# Patient Record
Sex: Male | Born: 1935 | Race: White | Hispanic: No | Marital: Married | Smoking: Former smoker
Health system: Southern US, Community
[De-identification: ages and names within clinical notes are randomized; demographics above are authoritative.]

## PROBLEM LIST (undated history)

## (undated) DIAGNOSIS — I10 Essential (primary) hypertension: Secondary | ICD-10-CM

## (undated) DIAGNOSIS — J449 Chronic obstructive pulmonary disease, unspecified: Secondary | ICD-10-CM

## (undated) DIAGNOSIS — N179 Acute kidney failure, unspecified: Secondary | ICD-10-CM

## (undated) DIAGNOSIS — E669 Obesity, unspecified: Secondary | ICD-10-CM

## (undated) DIAGNOSIS — E785 Hyperlipidemia, unspecified: Secondary | ICD-10-CM

## (undated) DIAGNOSIS — I679 Cerebrovascular disease, unspecified: Secondary | ICD-10-CM

## (undated) DIAGNOSIS — M1990 Unspecified osteoarthritis, unspecified site: Secondary | ICD-10-CM

## (undated) DIAGNOSIS — C9110 Chronic lymphocytic leukemia of B-cell type not having achieved remission: Principal | ICD-10-CM

## (undated) DIAGNOSIS — I4891 Unspecified atrial fibrillation: Secondary | ICD-10-CM

## (undated) DIAGNOSIS — Z862 Personal history of diseases of the blood and blood-forming organs and certain disorders involving the immune mechanism: Secondary | ICD-10-CM

## (undated) DIAGNOSIS — I509 Heart failure, unspecified: Secondary | ICD-10-CM

## (undated) DIAGNOSIS — E119 Type 2 diabetes mellitus without complications: Secondary | ICD-10-CM

## (undated) HISTORY — DX: Unspecified atrial fibrillation: I48.91

## (undated) HISTORY — DX: Personal history of diseases of the blood and blood-forming organs and certain disorders involving the immune mechanism: Z86.2

## (undated) HISTORY — DX: Cerebrovascular disease, unspecified: I67.9

## (undated) HISTORY — DX: Type 2 diabetes mellitus without complications: E11.9

## (undated) HISTORY — DX: Essential (primary) hypertension: I10

## (undated) HISTORY — DX: Chronic lymphocytic leukemia of B-cell type not having achieved remission: C91.10

## (undated) HISTORY — DX: Hyperlipidemia, unspecified: E78.5

## (undated) HISTORY — PX: VASECTOMY: SHX75

## (undated) HISTORY — PX: KNEE ARTHROSCOPY W/ ALLOGRAFT IMPANT: SHX1862

## (undated) HISTORY — DX: Unspecified osteoarthritis, unspecified site: M19.90

## (undated) HISTORY — DX: Obesity, unspecified: E66.9

---

## 2006-04-12 ENCOUNTER — Encounter: Admission: RE | Admit: 2006-04-12 | Discharge: 2006-04-12 | Payer: Self-pay | Admitting: Family Medicine

## 2007-03-07 ENCOUNTER — Encounter: Admission: RE | Admit: 2007-03-07 | Discharge: 2007-03-07 | Payer: Self-pay | Admitting: Family Medicine

## 2008-10-27 ENCOUNTER — Inpatient Hospital Stay (HOSPITAL_COMMUNITY): Admission: RE | Admit: 2008-10-27 | Discharge: 2008-10-31 | Payer: Self-pay | Admitting: Orthopedic Surgery

## 2008-10-27 ENCOUNTER — Encounter (INDEPENDENT_AMBULATORY_CARE_PROVIDER_SITE_OTHER): Payer: Self-pay | Admitting: Orthopedic Surgery

## 2008-10-27 ENCOUNTER — Ambulatory Visit: Payer: Self-pay | Admitting: Cardiology

## 2008-12-03 ENCOUNTER — Encounter: Payer: Self-pay | Admitting: Cardiology

## 2008-12-03 ENCOUNTER — Ambulatory Visit: Payer: Self-pay | Admitting: Cardiology

## 2008-12-03 DIAGNOSIS — E785 Hyperlipidemia, unspecified: Secondary | ICD-10-CM | POA: Insufficient documentation

## 2008-12-03 DIAGNOSIS — I10 Essential (primary) hypertension: Secondary | ICD-10-CM

## 2008-12-03 DIAGNOSIS — I4891 Unspecified atrial fibrillation: Secondary | ICD-10-CM

## 2008-12-03 DIAGNOSIS — R609 Edema, unspecified: Secondary | ICD-10-CM

## 2008-12-17 ENCOUNTER — Ambulatory Visit: Payer: Self-pay | Admitting: Cardiology

## 2008-12-17 LAB — CONVERTED CEMR LAB
Albumin: 3.8 g/dL (ref 3.5–5.2)
Alkaline Phosphatase: 49 units/L (ref 39–117)
BUN: 18 mg/dL (ref 6–23)
Bilirubin, Direct: 0.2 mg/dL (ref 0.0–0.3)
CO2: 34 meq/L — ABNORMAL HIGH (ref 19–32)
Chloride: 100 meq/L (ref 96–112)
Cholesterol: 95 mg/dL (ref 0–200)
Creatinine, Ser: 1 mg/dL (ref 0.4–1.5)
LDL Cholesterol: 40 mg/dL (ref 0–99)
Potassium: 4 meq/L (ref 3.5–5.1)
Total CHOL/HDL Ratio: 3
Total Protein: 7.2 g/dL (ref 6.0–8.3)
Triglycerides: 108 mg/dL (ref 0.0–149.0)
VLDL: 21.6 mg/dL (ref 0.0–40.0)

## 2009-02-17 ENCOUNTER — Encounter: Payer: Self-pay | Admitting: *Deleted

## 2009-03-25 ENCOUNTER — Encounter: Payer: Self-pay | Admitting: *Deleted

## 2009-04-08 ENCOUNTER — Encounter (INDEPENDENT_AMBULATORY_CARE_PROVIDER_SITE_OTHER): Payer: Self-pay | Admitting: *Deleted

## 2010-02-08 ENCOUNTER — Telehealth: Payer: Self-pay | Admitting: Pulmonary Disease

## 2010-02-19 ENCOUNTER — Ambulatory Visit: Payer: Self-pay | Admitting: Pulmonary Disease

## 2010-02-19 DIAGNOSIS — R0602 Shortness of breath: Secondary | ICD-10-CM | POA: Insufficient documentation

## 2010-02-19 DIAGNOSIS — J9610 Chronic respiratory failure, unspecified whether with hypoxia or hypercapnia: Secondary | ICD-10-CM | POA: Insufficient documentation

## 2010-02-19 LAB — CONVERTED CEMR LAB
Chloride: 99 meq/L (ref 96–112)
GFR calc non Af Amer: 43.77 mL/min (ref >60)
Potassium: 4.8 meq/L (ref 3.5–5.1)
Pro B Natriuretic peptide (BNP): 187.7 pg/mL — ABNORMAL HIGH (ref 0.0–100.0)
Sodium: 143 meq/L (ref 135–145)

## 2010-02-25 ENCOUNTER — Telehealth: Payer: Self-pay | Admitting: Pulmonary Disease

## 2010-03-02 ENCOUNTER — Ambulatory Visit (HOSPITAL_COMMUNITY): Admission: RE | Admit: 2010-03-02 | Discharge: 2010-03-02 | Payer: Self-pay | Admitting: Pulmonary Disease

## 2010-03-02 ENCOUNTER — Ambulatory Visit: Payer: Self-pay | Admitting: Internal Medicine

## 2010-03-02 ENCOUNTER — Encounter: Payer: Self-pay | Admitting: Pulmonary Disease

## 2010-03-02 ENCOUNTER — Ambulatory Visit: Payer: Self-pay

## 2010-03-12 ENCOUNTER — Ambulatory Visit: Admission: RE | Admit: 2010-03-12 | Discharge: 2010-03-12 | Payer: Self-pay | Admitting: Pulmonary Disease

## 2010-03-12 ENCOUNTER — Ambulatory Visit: Payer: Self-pay | Admitting: Pulmonary Disease

## 2010-03-12 DIAGNOSIS — I5040 Unspecified combined systolic (congestive) and diastolic (congestive) heart failure: Secondary | ICD-10-CM | POA: Insufficient documentation

## 2010-03-12 DIAGNOSIS — J441 Chronic obstructive pulmonary disease with (acute) exacerbation: Secondary | ICD-10-CM | POA: Insufficient documentation

## 2010-03-12 DIAGNOSIS — E662 Morbid (severe) obesity with alveolar hypoventilation: Secondary | ICD-10-CM | POA: Insufficient documentation

## 2010-03-23 ENCOUNTER — Telehealth (INDEPENDENT_AMBULATORY_CARE_PROVIDER_SITE_OTHER): Payer: Self-pay | Admitting: *Deleted

## 2010-03-31 ENCOUNTER — Telehealth (INDEPENDENT_AMBULATORY_CARE_PROVIDER_SITE_OTHER): Payer: Self-pay | Admitting: *Deleted

## 2010-04-02 ENCOUNTER — Telehealth (INDEPENDENT_AMBULATORY_CARE_PROVIDER_SITE_OTHER): Payer: Self-pay | Admitting: *Deleted

## 2010-04-08 ENCOUNTER — Telehealth (INDEPENDENT_AMBULATORY_CARE_PROVIDER_SITE_OTHER): Payer: Self-pay | Admitting: *Deleted

## 2010-04-12 ENCOUNTER — Telehealth: Payer: Self-pay | Admitting: Pulmonary Disease

## 2010-04-15 ENCOUNTER — Ambulatory Visit: Payer: Self-pay | Admitting: Pulmonary Disease

## 2010-04-16 DIAGNOSIS — I2789 Other specified pulmonary heart diseases: Secondary | ICD-10-CM | POA: Insufficient documentation

## 2010-04-20 ENCOUNTER — Telehealth: Payer: Self-pay | Admitting: Pulmonary Disease

## 2010-04-23 ENCOUNTER — Encounter: Payer: Self-pay | Admitting: Pulmonary Disease

## 2010-04-28 ENCOUNTER — Encounter: Payer: Self-pay | Admitting: Pulmonary Disease

## 2010-05-07 ENCOUNTER — Telehealth (INDEPENDENT_AMBULATORY_CARE_PROVIDER_SITE_OTHER): Payer: Self-pay | Admitting: *Deleted

## 2010-05-10 ENCOUNTER — Telehealth: Payer: Self-pay | Admitting: Pulmonary Disease

## 2010-05-11 ENCOUNTER — Telehealth: Payer: Self-pay | Admitting: Pulmonary Disease

## 2010-06-09 ENCOUNTER — Ambulatory Visit: Payer: Self-pay | Admitting: Critical Care Medicine

## 2010-06-09 ENCOUNTER — Inpatient Hospital Stay (HOSPITAL_COMMUNITY): Admission: EM | Admit: 2010-06-09 | Discharge: 2010-07-22 | Payer: Self-pay | Admitting: Emergency Medicine

## 2010-06-09 ENCOUNTER — Ambulatory Visit: Payer: Self-pay | Admitting: Cardiology

## 2010-06-10 ENCOUNTER — Ambulatory Visit: Payer: Self-pay | Admitting: Infectious Disease

## 2010-06-11 ENCOUNTER — Encounter: Payer: Self-pay | Admitting: Internal Medicine

## 2010-06-11 ENCOUNTER — Ambulatory Visit: Payer: Self-pay | Admitting: Vascular Surgery

## 2010-06-30 ENCOUNTER — Encounter (INDEPENDENT_AMBULATORY_CARE_PROVIDER_SITE_OTHER): Payer: Self-pay | Admitting: *Deleted

## 2010-07-10 ENCOUNTER — Ambulatory Visit: Payer: Self-pay | Admitting: Pulmonary Disease

## 2010-07-15 ENCOUNTER — Ambulatory Visit: Payer: Self-pay | Admitting: Physical Medicine & Rehabilitation

## 2010-07-22 ENCOUNTER — Ambulatory Visit: Payer: Self-pay | Admitting: Physical Medicine & Rehabilitation

## 2010-07-22 ENCOUNTER — Inpatient Hospital Stay (HOSPITAL_COMMUNITY)
Admission: RE | Admit: 2010-07-22 | Discharge: 2010-08-06 | Payer: Self-pay | Admitting: Physical Medicine & Rehabilitation

## 2010-08-06 ENCOUNTER — Ambulatory Visit: Payer: Self-pay | Admitting: Critical Care Medicine

## 2010-08-06 ENCOUNTER — Inpatient Hospital Stay (HOSPITAL_COMMUNITY)
Admission: EM | Admit: 2010-08-06 | Discharge: 2010-09-06 | Payer: Self-pay | Attending: Pulmonary Disease | Admitting: Pulmonary Disease

## 2010-09-17 ENCOUNTER — Emergency Department (HOSPITAL_COMMUNITY)
Admission: EM | Admit: 2010-09-17 | Discharge: 2010-09-17 | Payer: Self-pay | Source: Home / Self Care | Admitting: Emergency Medicine

## 2010-09-17 ENCOUNTER — Inpatient Hospital Stay (HOSPITAL_COMMUNITY)
Admission: EM | Admit: 2010-09-17 | Discharge: 2010-09-24 | Disposition: A | Discharge status: Still In Hospital | Payer: Self-pay | Source: Home / Self Care | Attending: Orthopedic Surgery | Admitting: Orthopedic Surgery

## 2010-09-22 LAB — GLUCOSE, CAPILLARY
Glucose-Capillary: 81 mg/dL (ref 70–99)
Glucose-Capillary: 122 mg/dL — ABNORMAL HIGH (ref 70–99)
Glucose-Capillary: 160 mg/dL — ABNORMAL HIGH (ref 70–99)
Glucose-Capillary: 107 mg/dL — ABNORMAL HIGH (ref 70–99)

## 2010-09-23 LAB — GLUCOSE, CAPILLARY
Glucose-Capillary: 135 mg/dL — ABNORMAL HIGH (ref 70–99)
Glucose-Capillary: 98 mg/dL (ref 70–99)
Glucose-Capillary: 137 mg/dL — ABNORMAL HIGH (ref 70–99)
Glucose-Capillary: 129 mg/dL — ABNORMAL HIGH (ref 70–99)

## 2010-09-24 LAB — GLUCOSE, CAPILLARY
Glucose-Capillary: 81 mg/dL (ref 70–99)
Glucose-Capillary: 101 mg/dL — ABNORMAL HIGH (ref 70–99)

## 2010-09-28 ENCOUNTER — Ambulatory Visit
Admission: RE | Admit: 2010-09-28 | Discharge: 2010-09-28 | Payer: Self-pay | Source: Home / Self Care | Attending: Pulmonary Disease | Admitting: Pulmonary Disease

## 2010-10-09 ENCOUNTER — Encounter: Payer: Self-pay | Admitting: Physical Medicine & Rehabilitation

## 2010-10-15 NOTE — Op Note (Signed)
NAMEKEMET, NIJJAR                   ACCOUNT NO.:  [id number]  MEDICAL RECORD NO.:  [id number]          PATIENT TYPE:  INP  LOCATION:  1240                         FACILITY:  [facility]  PHYSICIAN:  Vania Rea. Supple, M.D.  DATE OF BIRTH:  1933/03/25  DATE OF PROCEDURE:  09/19/2010 DATE OF DISCHARGE:  09/17/2010                              OPERATIVE REPORT   PREOPERATIVE DIAGNOSIS:  Septic right total knee arthroplasty.  POSTOPERATIVE DIAGNOSIS:  Septic right total knee arthroplasty.  PROCEDURE:  Right knee arthroscopic synovectomy and lavage.  SURGEON:  Vania Rea. Supple, MD.  ANESTHESIA:  LMA general.  ESTIMATED BLOOD LOSS:  Minimal.  DRAINS:  Hemovac x1.  SPECIMEN:  Joint fluid was sent for routine culture and sensitivity.  HISTORY:  Mr. Manganaro is a 77 year old gentleman who underwent an original right total knee arthroplasty with Dr. Lequita Halt back in February 2010 and initially did well with his knee.  More recently, he has had chronic medical difficulties with an admission to the hospital back in September of this year for pneumonia and additional multiple complications including ventilator-dependent respiratory failure, cardiopulmonary arrest, septic shock and additional multiple medical complications.  He was subsequently stabilized and was recently discharged to a skilled nursing facility in mid December.  More recently, he presents with a 4-5 day history of increasing right knee pain.  He was apparently seen in our office where right knee aspiration was performed showing evidence for an elevated white count.  He was subsequently admitted to the [facility] this past Friday but due to his anticoagulation was not considered an acceptable risk for surgery until this morning.  He was subsequently brought to the operating room this time for planned arthroscopic lavage and synovectomy of his infected right total knee arthroplasty.  Preoperatively I had counseled Mr.  Stachnik on treatment options as well as risks versus benefits thereof.  Possible surgical complications were reviewed including potential for bleeding, persistent infection, DVT, PE and possible need for additional surgery and potential for anesthetic complications.  He understands and accepts and agrees with our plan.  PROCEDURE IN DETAIL:  After having undergone thorough preoperative evaluation, the patient was brought to the operating room and placed supine on the operating table where he underwent smooth induction of LMA general anesthesia.  The right lower extremity was then placed in a leg holder and sterilely prepped and draped in a standard fashion.  Time-out was called.  A superomedial inflow portal was established and through this I evacuated copious amounts of purulent synovial fluid.  Additional parapatellar arthroscopy portal was established and diagnostic arthroscopy was performed.  There was obvious diffuse synovitis and purulent material investing the synovial lining of the joint.  I performed a subtotal synovectomy arthroscopically, cleaning the pouch, gutters and particularly the anterior chamber.  The prosthetic joint surfaces all appeared to be in good condition.  There was no gross evidence for loosening of the implant.  We then ultimately lavaged the joint with 2 bags of arthroscopy fluid.  A superomedial drain was placed; a medium Hemovac.  The portals were all closed with 4-0 Nylon.  A bulky, dry dressing was then wrapped about the right knee and leg was wrapped with an Ace bandage.  The patient was then awakened, extubated, and taken to the recovery room in stable condition.     Vania Rea. Supple, M.D.     KMS/MEDQ  D:  09/19/2010  T:  09/19/2010  Job:  [id number]  Electronically Signed by Francena Hanly M.D. on 10/13/2010 07:43:54 AM

## 2010-10-19 NOTE — Progress Notes (Signed)
Summary: bipap/ sleep study needed-LMTCB x1  Phone Note From Other Clinic   Caller: lisa matthews- [facility] Call For: clance Summary of Call: caller states that the ONLY way that [employer] will cover the cost of pt's bipap is if pt has a sleep study. no change is needed for the bipap re: settings, etc. but he does need the sleep study done for the cost to be covered. lisa matthews- case manager- [facility]/ [place] cell [phone] Initial call taken by: Tivis Ringer, CNA,  May 07, 2010 4:02 PM  Follow-up for Phone Call        [facility], please advise if okay to put order in for Sleep Study. Thanks.Reynaldo Minium CMA  May 07, 2010 4:08 PM   Additional Follow-up for Phone Call Additional follow up Details #1::        please see if she saw the letter I dictated to [facility].  the pt does not need bipap for sleep apnea, but for chronic respiratory failure.  He meets every criteria that [employer] sent [place] to qualify.  See if this note was ever received by [facility]. Additional Follow-up by: Barbaraann Share MD,  May 10, 2010 1:29 PM    Additional Follow-up for Phone Call Additional follow up Details #2::    LMOVM for [facility] TCB Vernie Murders  May 10, 2010 1:39 PM  Returning call.Darletta Moll  May 10, 2010 2:34 PM  Called, spoke with Misty Stanley.  Per Misty Stanley, the request for bipap was declined on July 21.  Informed her letter was dictated on July 29 by [facility] and sent to [facility].  Per Misty Stanley, she will call [employer] to see if they have receieved this letter and if so check to see if bipap has been approved.  Will call office back if anything further is needed.   Follow-up by: Gweneth Dimitri RN,  May 10, 2010 2:57 PM

## 2010-10-19 NOTE — Progress Notes (Signed)
  Phone Note Other Incoming   Request: Send information Summary of Call: Request received from [facility] forwarded to [employer].

## 2010-10-19 NOTE — Procedures (Signed)
Summary: Oximetry/[facility]  Oximetry/[facility]   Imported By: Sherian Rein 04/30/2010 15:20:25  _____________________________________________________________________  External Attachment:    Type:   Image     Comment:   External Document

## 2010-10-19 NOTE — Assessment & Plan Note (Signed)
Summary: consult for chronic resp failure.   Copy to:  Windle Guard Primary Provider/Referring Provider:  Windle Guard  CC:  Pulmonary Consult.  History of Present Illness: The pt is a very pleasant 77y/o male who I have been asked to see for dypsnea.  He has been sob for at least a year, and feels that it has been slowly progressive.  It has gotten to the point that he can get severely sob just walking thru the house, and he must sleep in the recliner at night due to sob.  He has worsening LE edema, but improved since his diuretic dose was increased.  He has a long h/o smoking, and has never had pfts.  He has not had a recent cxr.  He has a h/o afib, but has not had recent echo or stress test.  His echo last year showed normal LV function, mild LA dilatation, mild increase in PA pressures, nl RV.  The pt has been on coumadin for one year, and he tells me his levels have been adequate.  He also tells me that his oxygen sats have been in 89-91% range since last year.  His weight is actually down 20 pounds in the last one year.  He denies any cough or chest congestion.  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  The pt has non restorative sleep, and some sleepiness during the day.    Current Medications (verified): 1)  Lopressor 50 Mg Tabs (Metoprolol Tartrate) .... Take One and One Half Tablets Two Times A Day 2)  Proair Hfa 108 (90 Base) Mcg/act  Aers (Albuterol Sulfate) .Marland Kitchen.. 1-2 Puffs Every 4-6 Hours As Needed 3)  Metformin Hcl 1000 Mg Tabs (Metformin Hcl) .... Take 1/2 Tab By Mouth Two Times A Day 4)  Furosemide 40 Mg Tabs (Furosemide) .... Take 1 1/2 Two Times A Day 5)  Metoprolol Tartrate 100 Mg Tabs (Metoprolol Tartrate) .... Take 1 Tablet By Mouth Two Times A Day 6)  Pravachol 40 Mg Tabs (Pravastatin Sodium) .... Take 1 Tablet By Mouth Once A Day 7)  Coumadin 5 Mg Tabs (Warfarin Sodium) .... Take As Directed  Allergies  (verified): 1)  ! Quinine  Past History:  Past Medical History: Reviewed history from 12/03/2008 and no changes required. 1.  Atrial Fibrillation: apparently developed post-op right TKR in 2/10.  Pt was started on coumadin.  2. Diabetes Type 2 3. Hyperlipidemia 4. Hypertension 5. Cerebrovascular Disease-CVA-2008 6. Obesity 7. Osteoarthritis left knee, s/p R TKR 8. History of thrombocytopenia 9. Echo (2/10): EF 55-60%, no regional wall motion abnormalities, mild to mod LVH, mild LAE, PASP 34 mmHg.   Past Surgical History: Reviewed history from 12/01/2008 and no changes required. Right total knee replacement 10/27/08 Vasectomy  Family History: Reviewed history from 12/01/2008 and no changes required. Father: deceased MVA also had heart disease  Mother: deceased MVA  Social History: Reviewed history from 12/03/2008 and no changes required. Married to [employer] Tobacco Use - Former. -started at age 65.  2 ppd.  quit 1982. 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   Review of Systems       The patient complains of shortness of breath with activity, shortness of breath at rest, irregular heartbeats, acid heartburn, loss of appetite, difficulty swallowing, nasal congestion/difficulty breathing through nose, sneezing, anxiety, depression, hand/feet swelling, and joint stiffness or pain.  The patient denies productive cough, non-productive cough, coughing up blood,  chest pain, indigestion, weight change, abdominal pain, sore throat, tooth/dental problems, headaches, itching, ear ache, rash, change in color of mucus, and fever.    Vital Signs:  Patient profile:   77 year old male Height:      74 inches Weight:      325 pounds BMI:     41.88 O2 Sat:      84 % on Room air Temp:     97.7 degrees F oral Pulse rate:   62 / minute BP sitting:   136 / 72  (left arm) Cuff size:   large  Vitals Entered By: Arman Filter LPN (February 19, 3085 4:06 PM)  O2 Flow:  Room air CC:  Pulmonary Consult Comments started pt on 2 LPM of oxygen.  Rechecked pt's o2 sats.  O2 stats increased to 96 % on 2 LPM  Medications reviewed with patient. Arman Filter LPN  February 20, 5783 4:11 PM    Physical Exam  General:  morbidly obese male in nad Eyes:  PERRLA and EOMI.   Nose:  patent without discharge Mouth:  significant elongation of soft palate and uvula Neck:  no jvd, tmg, LN Lungs:  decreased bs with basilar crackles. no wheezing or rhonchi Heart:  slightly irregular rhythm, cvr no murmurs or rubs. Abdomen:  large, with encroachment on chest soft and nontender, bs+ Extremities:  2-3+ edema with stasis changes no cyanosis pulses intact distally, but present. Neurologic:  alert and oriented, moves all 4.   Impression & Recommendations:  Problem # 1:  DYSPNEA (ICD-786.05) the pt has severe doe with chronic hypoxemia that I suspect is multifactorial.  He is morbidly obese, probably has ohs/osa, may have a component of emphysema with his h/o smoking, and likely has biventricular failure.  He obviously would benefit from 24hrs a day oxygen, and will help reduce his PA pressures and help with endurance with activity.  Will need to check echo, pfts, and sleep study.  He also needs to be diuresed as renal function and bp tolerates.  Finally, I have discussed with him the importance of weight loss.  Problem # 2:  CHRONIC RESPIRATORY FAILURE (ICD-518.83) the pt has had abnormal sats for a year according to his history, but not quite this low.  Will need to be started on oxygen.  Medications Added to Medication List This Visit: 1)  Proair Hfa 108 (90 Base) Mcg/act Aers (Albuterol sulfate) .Marland Kitchen.. 1-2 puffs every 4-6 hours as needed 2)  Metformin Hcl 1000 Mg Tabs (Metformin hcl) .... Take 1/2 tab by mouth two times a day 3)  Furosemide 40 Mg Tabs (Furosemide) .... Take 1 1/2 two times a day 4)  Metoprolol Tartrate 100 Mg Tabs (Metoprolol tartrate) .... Take 1 tablet by mouth two  times a day 5)  Pravachol 40 Mg Tabs (Pravastatin sodium) .... Take 1 tablet by mouth once a day 6)  Coumadin 5 Mg Tabs (Warfarin sodium) .... Take as directed  Other Orders: Pulmonary Referral (Pulmonary) Echo Referral (Echo) Consultation Level V (99245) TLB-BMP (Basic Metabolic Panel-BMET) (80048-METABOL) TLB-BNP (B-Natriuretic Peptide) (83880-BNPR) T-2 View CXR (71020TC) DME Referral (DME)  Patient Instructions: 1)  will start on oxygen at 2 liters 24 hours a day 2)  will check cxr and bloodwork today 3)  will arrange for echocardiogram to evaluate your heart 4)  will order breathing studies to evaluate your lungs. 5)  work aggressively on weight loss 6)  you will need a sleep study as well, but will  wait until the above results are available. 7)  will arrange followup with me once results are available.

## 2010-10-19 NOTE — Assessment & Plan Note (Signed)
Summary: rov for multifactorial chronic resp. failure.   Copy to:  Windle Guard Primary Provider/Referring Provider:  Windle Guard  CC:  Pt is here for a f/u appt to go over PFT and Echo and recent bloodwork.  .  History of Present Illness: the pt comes in today for f/u of his various testing for evaluation of dyspnea.  He was found to have renal insuff and mildly increased BNP by his most recent bloodwork.  HIs echo showed normal LV function, but his LA and RA were moderately dilated and he had mild to moderate pulmonary hypertension.  His pfts today showed mild airflow obstruction, mild restriction, and a mild decrease in dlco.  I have gone over all of the various studies with him in detail, and answered all of his questions.  Current Medications (verified): 1)  Metoprolol Tartrate 100 Mg Tabs (Metoprolol Tartrate) .... Take 1 Tablet By Mouth Two Times A Day 2)  Metformin Hcl 1000 Mg Tabs (Metformin Hcl) .... Take 1/2 Tab By Mouth Two Times A Day 3)  Furosemide 80 Mg Tabs (Furosemide) .... Take 1 Tab By Mouth Each Morning As Needed 4)  Pravachol 40 Mg Tabs (Pravastatin Sodium) .... Take 1 Tablet By Mouth Once A Day 5)  Coumadin 5 Mg Tabs (Warfarin Sodium) .... Take As Directed 6)  Oxycodone .... Take By Mouth As Needed  Allergies (verified): 1)  ! Quinine  Past History:  Past medical, surgical, family and social histories (including risk factors) reviewed, and no changes noted (except as noted below).  Past Medical History: Reviewed history from 12/03/2008 and no changes required. 1.  Atrial Fibrillation: apparently developed post-op right TKR in 2/10.  Pt was started on coumadin.  2. Diabetes Type 2 3. Hyperlipidemia 4. Hypertension 5. Cerebrovascular Disease-CVA-2008 6. Obesity 7. Osteoarthritis left knee, s/p R TKR 8. History of thrombocytopenia 9. Echo (2/10): EF 55-60%, no regional wall motion abnormalities, mild to mod LVH, mild LAE, PASP 34 mmHg.   Past Surgical  History: Reviewed history from 12/01/2008 and no changes required. Right total knee replacement 10/27/08 Vasectomy  Family History: Reviewed history from 02/19/2010 and no changes required. Father: deceased MVA also had heart disease  Mother: deceased MVA  Social History: Reviewed history from 02/19/2010 and no changes required. Married to [employer] Tobacco Use - Former. -started at age 38.  2 ppd.  quit 1982. 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   Review of Systems       The patient complains of shortness of breath with activity.  The patient denies shortness of breath at rest, productive cough, non-productive cough, coughing up blood, chest pain, irregular heartbeats, acid heartburn, indigestion, loss of appetite, weight change, abdominal pain, difficulty swallowing, sore throat, tooth/dental problems, headaches, nasal congestion/difficulty breathing through nose, sneezing, itching, ear ache, anxiety, depression, hand/feet swelling, joint stiffness or pain, rash, change in color of mucus, and fever.    Vital Signs:  Patient profile:   77 year old male Height:      73 inches Weight:      327 pounds O2 Sat:      95 % on 3 LPM cont.  Temp:     98.1 degrees F oral Pulse rate:   76 / minute BP sitting:   140 / 78  (left arm) Cuff size:   large  Vitals Entered By: Arman Filter LPN (March 12, 2010 1:45 PM)  O2 Flow:  3 LPM cont.  CC: Pt is here for a f/u appt  to go over PFT, Echo and recent bloodwork.   Comments Medications reviewed with patient Arman Filter LPN  March 12, 2010 1:45 PM    Physical Exam  General:  morbidly obese male in nad Nose:  no purulence or drainage. Neck:  mild jvd, no tmg Lungs:  mildly decreased bs with bibasilar crackles. no wheezing noted. Heart:  ?irreg with cvr, no murmurs Extremities:  2+ edema with stasis changes. no cyanosis Neurologic:  alert and oriented, moves all 4.   Impression & Recommendations:  Problem # 1:  OBESITY  HYPOVENTILATION SYNDROME (ICD-278.8) I think the pt has OHS with chronic respiratory failure, and would benefit from a bipap st device with backup rate.  I have explained to the pt how critical weight loss is for him, and that he will never improve without it.  If he continues to gain weight, we will have very few options for ventilation other than trach with nocturnal ventilator.  Problem # 2:  EMPHYSEMA, MILD (ICD-492.8) the pt has mild emphysema by his recent pfts.  He may benefit from a bronchodilator regimen, and will try him on spiriva.  I suspect this is not a significant contributor to his overall sob, but will see how he responds to meds.  Problem # 3:  CONGESTIVE HEART FAILURE, BIVENTRICULAR DYSFUNCTION (ICD-428.40) the pt has evidence for volume overload that I suspect is due to biventricular failure based on his echo report.  He is being diuresed, and I am not sure how much more this can be pushed with his renal insufficiency.  Medications Added to Medication List This Visit: 1)  Metoprolol Tartrate 100 Mg Tabs (Metoprolol tartrate) .... Take 1 tablet by mouth two times a day 2)  Furosemide 80 Mg Tabs (Furosemide) .... Take 1 tab by mouth each morning as needed 3)  Oxycodone  .... Take by mouth as needed 4)  Spiriva Handihaler 18 Mcg Caps (Tiotropium bromide monohydrate) .... One puff  in handihaler daily 5)  Ventolin Hfa 108 (90 Base) Mcg/act Aers (Albuterol sulfate) .... 2 puffs every 4-6 hours as needed  Other Orders: Est. Patient Level V (29562) DME Referral (DME) Pulmonary Referral (Pulmonary)  Patient Instructions: 1)  you must lose weight 2)  continue on oxygen 3)  will send to [facility] for arterial blood gas to measure your carbon dioxide level 4)  will start on bipap to help keep your oxygen level up at night.  Please call me if having issues with tolerance. 5)  will start on spiriva one each am for your emphysema 6)  ventolin inhaler 2 puffs up to every 6 hrs if needed  for emergencies 7)  followup with me in 4 weeks.   Prescriptions: VENTOLIN HFA 108 (90 BASE) MCG/ACT  AERS (ALBUTEROL SULFATE) 2 puffs every 4-6 hours as needed  #1 x 6   Entered and Authorized by:   Barbaraann Share MD   Signed by:   Barbaraann Share MD on 03/12/2010   Method used:   Print then Give to Patient   RxID:   [id number] SPIRIVA HANDIHALER 18 MCG  CAPS (TIOTROPIUM BROMIDE MONOHYDRATE) one puff  in handihaler daily  #30 x 6   Entered and Authorized by:   Barbaraann Share MD   Signed by:   Barbaraann Share MD on 03/12/2010   Method used:   Print then Give to Patient   RxID:   [id number]

## 2010-10-19 NOTE — Progress Notes (Signed)
Summary: appt-await call back  Phone Note From Other Clinic Call back at [phone]   Caller: jim tomlinson/PA Call For: Matylda Fehring Summary of Call: Pt has a 6/8 appt with [facility], Dr. Jeannetta Nap office wants him to be seen sooner, pls advise. Initial call taken by: Darletta Moll,  Feb 08, 2010 3:53 PM  Follow-up for Phone Call        calle Dr. Jeannetta Nap office to see if it had to be [facility] and how much sooner did pt need to be seen. Amy wcb, doctor was in a room. I spoke to [facility] and he is booked until June 3rd, we can offer 3:45 slot. Will await call back.Carron Curie CMA  Feb 08, 2010 4:08 PM  spoke with AMy at Dr. Jeannetta Nap office and June 3rd would be fine so pt set for June 2rd at 3:45 advised to be here at 3:30 for paperwork. Amy will notify pt.Jennifer Yancey Flemings CMA  Feb 08, 2010 4:14 PM

## 2010-10-19 NOTE — Assessment & Plan Note (Signed)
Summary: rov for ohs, biventricular failure, chronic RF   Copy to:  Windle Guard Primary Provider/Referring Provider:  Windle Guard  CC:  Pt is here for a f/u appt.  Pt states he was able to get his bipap machine and has been using it every night x 2 weeks.  Pt states he feels rested in the mornings after using bipap.  Pt states his breathing is "maybe a little better" since starting Spiriva.  Pt denied a cough.  Pt states he hasn't needed to use rescue hfa.   Pt would like order for portable oxygen.  Pt would also like a motorized wheelchair.Marland Kitchen  History of Present Illness: the pt comes in today for f/u of his multifactorial chronic respiratory failure.  He has documented OHS (arterial pCO2 of 52 with adequate compensation), biventricular failure with secondary pulm htn, and very mild copd by pfts.  At the last visit, he was started on bipap st for his OHS, and states he has seen a dramatic improvement in how he feels on the device.  He is sleeping better, is more rested, and his alertness is much improved.  He feels that he breathes better in the am's as well.  With respect to his copd, he thinks spiriva may have helped a "little".    Medications Prior to Update: 1)  Metoprolol Tartrate 100 Mg Tabs (Metoprolol Tartrate) .... Take 1 Tablet By Mouth Two Times A Day 2)  Metformin Hcl 1000 Mg Tabs (Metformin Hcl) .... Take 1/2 Tab By Mouth Two Times A Day 3)  Furosemide 80 Mg Tabs (Furosemide) .... Take 1 Tab By Mouth Each Morning As Needed 4)  Pravachol 40 Mg Tabs (Pravastatin Sodium) .... Take 1 Tablet By Mouth Once A Day 5)  Coumadin 5 Mg Tabs (Warfarin Sodium) .... Take As Directed 6)  Oxycodone .... Take By Mouth As Needed 7)  Spiriva Handihaler 18 Mcg  Caps (Tiotropium Bromide Monohydrate) .... One Puff  in Handihaler Daily 8)  Ventolin Hfa 108 (90 Base) Mcg/act  Aers (Albuterol Sulfate) .... 2 Puffs Every 4-6 Hours As Needed  Allergies (verified): 1)  ! Quinine  Past History:  Past  medical, surgical, family and social histories (including risk factors) reviewed, and no changes noted (except as noted below).  Past Medical History: Reviewed history from 12/03/2008 and no changes required. 1.  Atrial Fibrillation: apparently developed post-op right TKR in 2/10.  Pt was started on coumadin.  2. Diabetes Type 2 3. Hyperlipidemia 4. Hypertension 5. Cerebrovascular Disease-CVA-2008 6. Obesity 7. Osteoarthritis left knee, s/p R TKR 8. History of thrombocytopenia 9. Echo (2/10): EF 55-60%, no regional wall motion abnormalities, mild to mod LVH, mild LAE, PASP 34 mmHg.   Past Surgical History: Reviewed history from 12/01/2008 and no changes required. Right total knee replacement 10/27/08 Vasectomy  Family History: Reviewed history from 02/19/2010 and no changes required. Father: deceased MVA also had heart disease  Mother: deceased MVA  Social History: Reviewed history from 02/19/2010 and no changes required. Married to [employer] Tobacco Use - Former. -started at age 48.  2 ppd.  quit 1982. 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   Review of Systems       The patient complains of shortness of breath with activity.  The patient denies shortness of breath at rest, productive cough, non-productive cough, coughing up blood, chest pain, irregular heartbeats, acid heartburn, indigestion, loss of appetite, weight change, abdominal pain, difficulty swallowing, sore throat, tooth/dental problems, headaches, nasal congestion/difficulty breathing through  nose, sneezing, itching, ear ache, anxiety, depression, hand/feet swelling, joint stiffness or pain, rash, change in color of mucus, and fever.    Vital Signs:  Patient profile:   77 year old male Height:      73 inches Weight:      324 pounds BMI:     42.90 O2 Sat:      92 % on 2 LPM cont Temp:     98.1 degrees F oral Pulse rate:   74 / minute BP sitting:   110 / 68  (right arm) Cuff size:   large  Vitals  Entered By: Arman Filter LPN (April 15, 2010 3:03 PM)  O2 Flow:  2 LPM cont CC: Pt is here for a f/u appt.  Pt states he was able to get his bipap machine and has been using it every night x 2 weeks.  Pt states he feels rested in the mornings after using bipap.  Pt states his breathing is "maybe a little better" since starting Spiriva.  Pt denied a cough.  Pt states he hasn't needed to use rescue hfa.   Pt would like order for portable oxygen.  Pt would also like a motorized wheelchair. Comments Medications reviewed with patient Arman Filter LPN  April 15, 2010 3:07 PM    Physical Exam  General:  morbidly obese male in nad Nose:  no skin breakdown or pressure necrosis from cpap mask Lungs:  no wheezing or rhonchi Heart:  rrr, no mrg Extremities:  2+ edema bilat, no cyanosis Neurologic:  alert, not sleepy, and moves all 4.   Impression & Recommendations:  Problem # 1:  OBESITY HYPOVENTILATION SYNDROME (ICD-278.8) the pt has seen great improvement in his sleep and nocturnal breathing with the bipap st device.  It is essential that he have the backup rate with his documented hypercarbia, and OHS.  I have asked him to stay on this, and we will need to verify that we are adequately oxygenating him during sleep.  He needs to work aggressively on weight loss.  Problem # 2:  EMPHYSEMA, MILD (ICD-492.8) the pt has seen some improvement with spiriva, and I have asked him to continue for now.  If no signficant improvment, may consider a trial of symbicort.  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  Other Orders: Est. Patient Level IV (29562) DME Referral (DME)  Patient Instructions: 1)  will send an order to advanced to check for more portable oxygen source. 2)  will check oxygen level overnight 3)  work on weight loss. 4)  followup with me in 3mos.  Appended Document: rov for ohs, biventricular failure,  chronic RF received ONO results rom [facility] and put in KC's very important look at folder for him to review

## 2010-10-19 NOTE — Miscellaneous (Signed)
Summary: Orders Update pft charges  Clinical Lists Changes  Orders: Added new Service order of Carbon Monoxide diffusing w/capacity (94720) - Signed Added new Service order of Lung Volumes (94240) - Signed Added new Service order of Spirometry (Pre & Post) (94060) - Signed 

## 2010-10-19 NOTE — Progress Notes (Signed)
Summary: bipap/ insurance coverage  Phone Note Call from Patient   Caller: Spouse Call For: clance Summary of Call: pt's spouse states that ins co. refuses to pay for bipap. spouse wants to know if there is a paper that she can pick up to send in or if there is a problem with the order. [phone] Initial call taken by: Tivis Ringer, CNA,  April 08, 2010 3:36 PM  Follow-up for Phone Call        called and spoke with pts wife and she stated that the insurance company will not cover the bipap---andy told them from [facility] told them to contact our office..she stated that he has been on the bipap x 1 week and this has helped him a great deal. return call on the home phone.  please advise Randell Loop [facility]  April 08, 2010 4:32 PM   Additional Follow-up for Phone Call Additional follow up Details #1::        spoke to pt and his wife informed them dr Shelle Iron is going to dictate a letter to the ins company to try to get the bipap covered Additional Follow-up by: Oneita Jolly,  April 09, 2010 9:15 AM

## 2010-10-19 NOTE — Miscellaneous (Signed)
Summary: ONO results.   Clinical Lists Changes  ONO on 2 liters and bipap shows low sat of 87%, but only spent 20sec the entire night less than 88%. no change in therapy  Appended Document: ONO results. megan, please let pt know that his ONO shows his oxygen levels are adequate on his current oxygen setting and bipap.  thanks.  Appended Document: ONO results. called and spoke with pt's wife.  wife aware of ONO results and to stay on current oxygen settings and bipap.  wife verbalized understanding and stated she will inform pt of the results.

## 2010-10-19 NOTE — Progress Notes (Signed)
Summary: oxygen question/cb  Phone Note Call from Patient Call back at Home Phone [phone]   Caller: wife- helen Call For: clance Summary of Call: pt is not any better on the oxygen wants to know what to do Initial call taken by: Lacinda Axon,  February 25, 2010 10:54 AM  Follow-up for Phone Call        pt states he knows dr clance is still in the treatment process and he has an echo and pft's set up then ov with kc on the 24th of this month he just wanted to give feedback that the o2 alone has not made his breathing a lot better his wife just insisted that he call--pt does not need a call back just wanted wife to know that he did call and say this to dr clance--pt will keep all scheduled appts and see kc on the 24th.Marland Kitchen   FYI Follow-up by: Philipp Deputy CMA,  February 25, 2010 12:07 PM

## 2010-10-19 NOTE — Progress Notes (Signed)
Summary: c pap  Phone Note Call from Patient   Caller: Patient Call For: clance Summary of Call: calling to talk to nurse about c pap order. Initial call taken by: Rickard Patience,  April 20, 2010 1:42 PM  Follow-up for Phone Call        called and spoke with pt's wife.  wife states pt was just seen by [facility] on Friday 04-15-2010.  Wife states she informed KC that pt's ins denied order for cpap and that pt will need to go through "testing" before insurance will approve cpap.  Wife states kC stated he would "take care of this order for pt"  I assume what the wife is referring to is the order [facility] sent on 04-15-2010 for ONO on bipap and oxygen.  Wife states she has called [facility] regarding orders and was informed by [facility] that they haven't received anything from our office.  Will forward message to [facility] to address. Aundra Millet Reynolds LPN  April 20, 2010 2:44 PM       also, as an Financial planner....spoke with wife about pt requesting a motorized wheelchair.  per [facility], paperwork for motorized wheelchair require a mobility exam before considering scooter.  per [facility], we don't do these exams here, therefore pt can go to PCP or be referred to rehab specialist.  Wife verbalized understanding and states she will see if PCP will do this for pt.  wife requests i mail her the wheelchair form to her home address so they can take this form to PCP.  Aundra Millet Reynolds LPN  April 20, 2010 2:48 PM   Called to speak with Mayra Reel about above issue. Order probably was not p/u until Friday morning by [facility] (04/16/10). No answer. LMOAM for Lecretia to return my call. Rhonda Cobb  April 20, 2010 4:05 PM Spoke with Mayra Reel and she stated that there have been several orders and recommendations by Dr. Shelle Iron going back and forth to [facility] within the last several days. Apparently we are trying to get pt's insurance to approve his bipap as well. Mayra Reel stated that she had order and was heading to the [facility] to re-fax order in. She stated that someone  would contact pt today. Called pt and informed him of the above and that [facility] should call him today. Advised pt to let us know if he doesn't hear from [facility]. Pt understood and is just anxious to get a more portable o2 device. Rhonda Cobb  April 20, 2010 4:37 PM

## 2010-10-19 NOTE — Progress Notes (Signed)
Summary: sleep study order  Phone Note Call from Patient   Caller: Patient Call For: Emyah Roznowski Summary of Call: pt calling to see if order for sleep study have been done. Initial call taken by: Rickard Patience,  May 11, 2010 4:24 PM  Follow-up for Phone Call        spoke to pt and advised that we had to refax the letter from Dr. Shelle Iron stating that he does not need bipap for sleep issues therefor he does not need a sleep study. I also advised pt that I just spoke to [facility] his case Production designer, theatre/television/film as well and she is aware. Misty Stanley stated that she will follow-up with [employer] to make sure this is processed. Carron Curie CMA  May 11, 2010 4:49 PM

## 2010-10-19 NOTE — Progress Notes (Signed)
Summary: bipap st  Phone Note From Pharmacy   Caller: [email or web address]  Call For: clance Summary of Call: pt is being denied for bipap st due to needs more infro pt needs ono on 02 and then will have to start with S 1st then ST can be added if pt is not sucessful with bipap Initial call taken by: Oneita Jolly,  March 23, 2010 11:03 AM Summary of Call: Almyra Free , this is NOT being ordered for sleep apnea!  It is for chronic respiratory failure, and is being used for noninvasive ventilation.  He had abg at [facility] recently that I have not seen .  I suspect this will qualify him based on pco2.  See if we can get results from them.  Follow-up for Phone Call        dr clance recieved abg from hosp and instructed [facility] that by this pt qualifies for bipap Follow-up by: Oneita Jolly,  March 24, 2010 10:53 AM

## 2010-10-19 NOTE — Progress Notes (Signed)
  Phone Note Other Incoming   Request: Send information Summary of Call: Request received from [facility] forwarded to [employer].       

## 2010-10-19 NOTE — Progress Notes (Signed)
Summary: bipap- Long hold time  Phone Note From Other Clinic   Caller: lisa- case manager w/ [employer] Call For: clance Summary of Call: looks like kc and nurse have done everything requested re: letter of necessity for bipap. can someone call [employer] and confirm that this has been received by them? then call case manager lisa # [phone] to confirm this and she will call pt. pt is stressing out about having to pay a large amount of money for bipap. [employer] # [phone] PT id # is  [id number] Initial call taken by: Tivis Ringer, CNA,  May 10, 2010 3:21 PM  Follow-up for Phone Call        [facility]- was on hold x 6 min and never got to speak with rep.  Will try back again later Vernie Murders  May 10, 2010 4:03 PM   According to [facility] they never received the letter dated 04/16/2010. I will refax this letter to [facility], Care Management Team @ [phone]. I have a left a msg for pt's case manager, Misty Stanley to call so we can update her on this issue. Follow-up by: Michel Bickers CMA,  May 11, 2010 10:10 AM  Additional Follow-up for Phone Call Additional follow up Details #1::        Misty Stanley advised that letter was refaxed. she states she will check next week to see progress with the insurance company and let us know. She will also advise the pt. Carron Curie CMA  May 11, 2010 4:28 PM

## 2010-10-19 NOTE — Letter (Signed)
SummaryScience writer Pulmonary Care Appointment Letter  [facility]  [address]   [place], [place] [address]   Phone: [phone]  Fax: [phone]    06/30/2010 MRN: [id number]  William Braun [address] [place], [place]  [address]  Dear [name],   Our office is attempting to contact you about an appointment.  Please call our office at [phone] to re-schedule this appointment with Dr._Clance. He will be out of office 07/14/10.  Our registration staff is prepared to assist you with any questions you may have.    Thank you,   Nature conservation officer Pulmonary Division

## 2010-10-19 NOTE — Progress Notes (Signed)
Summary: letter  Phone Note Call from Patient Call back at Home Phone [phone]   Caller: Spouse-Helen Tung Call For: clance Reason for Call: Talk to Nurse Summary of Call: Ref: cpap- insurance turned them down.  Would like to bring the letter by.  Will this be ok? Initial call taken by: Eugene Gavia,  April 12, 2010 9:11 AM  Follow-up for Phone Call        called and spoke with pts wife and she stated that the insurance company has turned down pt for the cpap---she will bring the letter up here for [facility] to look at to see if he can help them out by writing a letter to the insurance company---she stated that the pt has the cpap now and it has helped him alot.   Randell Loop CMA  April 12, 2010 10:13 AM   pt's wife brought by denial letter for [facility] to have re: bi-pap machine. Follow-up by: Eugene Gavia,  April 12, 2010 11:29 AM  Additional Follow-up for Phone Call Additional follow up Details #1::        let them know that I have the info, and will write a letter for them.  the insurance company is being ridiculous, and should provide this. Additional Follow-up by: Barbaraann Share MD,  April 12, 2010 3:38 PM    Additional Follow-up for Phone Call Additional follow up Details #2::    called and spoke with pts wife---she is aware per [facility] that he has the denial letter from the insurance company and that he will take care of the letter. Randell Loop [facility]  April 12, 2010 4:09 PM

## 2010-10-20 NOTE — Discharge Summary (Signed)
NAMESEYED, HEFFLEY                   ACCOUNT NO.:  [id number]  MEDICAL RECORD NO.:  [id number]          PATIENT TYPE:  INP  LOCATION:  1606                         FACILITY:  [facility]  PHYSICIAN:  Ollen Gross, M.D.    DATE OF BIRTH:  20-Aug-1933  DATE OF ADMISSION:  09/17/2010 DATE OF DISCHARGE:  09/24/2010                        DISCHARGE SUMMARY - REFERRING   ADMITTING DIAGNOSES: 1. Septic right knee. 2. Chronic respiratory failure. 3. Obesity. 4. Sleep apnea. 5. Diabetes. 6. Atrial fibrillation. 7. Hypertension. 8. Chronic normocytic anemia. 9. Urinary retention. 10.History of cerebrovascular accident. 11.History of thrombocytopenia. 12.Recent complicated hospitalization dating from August 06, 2010,     to September 06, 2010, with acute respiratory failure, cardiac     arrest and tracheostomy.  DISCHARGE DIAGNOSES: 1. Septic right knee status post irrigation, debridement and     synovectomy arthroscopy right knee. 2. Urinary tract infection with Klebsiella pneumonia. 3. Right knee infection with group B streptococcus. 4. Right shoulder pain, conservative measures. 5. Chronic respiratory failure. 6. Obesity. 7. Sleep apnea. 8. Diabetes. 9. Atrial fibrillation. 10.Hypertension. 11.Chronic normocytic anemia. 12.Urinary retention. 13.History of cerebrovascular accident. 14.History of thrombocytopenia. 15.Recent complicated hospitalization dating from August 06, 2010,     to September 06, 2010, with acute respiratory failure, cardiac     arrest and tracheostomy.  PROCEDURE:  September 19, 2010, arthroscopic synovectomy incision and drainage of the right knee.  SURGEON:  Vania Rea. Supple, M.D.  ANESTHESIA:  Per LMA.  CONSULTATIONS: 1. Critical Care Medicine for ICU care, Charlcie Cradle. Delford Field, MD, FCCP. 2. Internal Medicine [facility], Brendia Sacks, M.D. 3. Orthopedics, Georges Lynch. Gioffre, M.D. for admitting services. 4. Orthopedics, Vania Rea. Supple, M.D. for  procedure. 5. Orthopedics, Ollen Gross, M.D. for further orthopedic care.  BRIEF HISTORY:  The patient is a 77 year old male who has had a very complicated history over the past month and a half with recent admission to the hospital dating back on August 06, 2010, for acute-on-chronic respiratory failure.  He unfortunately had cardiac arrest and had to be on the vent with a tracheostomy.  He had a month-long hospital stay from November 18 to December 19 and had been discharged over to [facility] for rehabilitation.  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  He was sent to the emergency room and found to have a red, hot, swollen knee.  He was then sent to the office for evaluation.  He underwent an aspiration at that time and found to have a purulent, cloudy aspirate from the right total-knee with it red, hot and swollen. It was felt at that time he would best be served by undergoing admission and irrigation and debridement.  He was admitted to [facility] on the date of December 30, per Dr. Worthy Rancher.  Unfortunately, he was on chronic Coumadin and had an elevated INR and was unable to proceed with surgery at that time.  We allowed him to have his INR drift down but was admitted for surgery.  LABORATORY DATA:  CBC on  admission slightly elevated white count of 11.2, hemoglobin was 9.7, hematocrit of 31.0.  The prothrombin time INR was elevated at 26.6 and 2.44.  Admission Chemistry panel all within normal limits with the exception of a low albumin of 2.5 and a negative MRSA nasal swab.  Serial CBCs were followed.  Followup hemoglobin following admission was 10.5.  It drifted down to 10.1.  Last noted H and H on September 21, 2010, was 9.8 with a hematocrit of 31.3.  White count was a little elevated at 11, but came down to 7.7.  That was the highest white count was his admission level.   His followup INR came down to 1.79 and then down to 1.44 for surgery.  He was placed back on Lovenox postoperatively.  Serial BMETs were followed.  The followup BMET on the day of surgery was within normal limits with the exception of a slightly elevated CO2 of 33.  Last BMET on September 21, 2010, within normal limits.  He did have a urinalysis taken on September 18, 2010. Cloudy appearance, large blood, positive nitrite, small leukocyte esterase, 7-10 white cells, 3-6 red cells, rare bacteria.  Urine culture did prove to be positive for Klebsiella pneumonia.  Sensitivities are in the chart.  He had wound cultures taken at the time of surgery on September 19, 2010.  Anaerobic culture showed no anaerobes isolated.  Wound culture showed group B Streptococcus.  RADIOLOGIC:  X-rays:  Two-view chest taken on September 17, 2010. Elevated right hemidiaphragm and right lower lobe atelectasis which appears chronic.  No acute abnormality.  Right knee film on September 17, 2010.  Joint effusion, etiology undetermined, stable.  Subtle loosening of his prostatic bone interface with the proximal tibia.  Loosening and/or infection cannot be excluded.  Portable chest on September 23, 2010. Left-sided PICC line terminating at the high SVC without pneumothorax, chronic right hemidiaphragm elevation with overlying atelectasis or scar.  HOSPITAL COURSE:  The patient is a 77 year old male who has had a previous right total-knee done a couple of years ago.  He was admitted to the hospital for the above-stated problems.  His medical history is very complicated due to a recent month-long hospitalization back in mid November through mid December where he had acute respiratory failure requiring tracheostomy and also had cardiac arrest at that time.  He was undergoing rehabilitation at [facility] when he was found to have a swollen, hot knee.  He was seen in the emergency room in the office, had knee aspiration  done and was found to have a cloudy aspirate.  It was felt he would best be served by undergoing admission to the hospital.  He was admitted on September 17, 2010, but his INR was elevated being on chronic Coumadin.  He was taken off the Coumadin and allowed for his INR to drift down.  He was seen by Dr. Darrelyn Hillock from an orthopedic standpoint and he was also given a dose of vitamin K.  On hospital day #2 on December 31, INR was still elevated and he needed to have a low INR in order to undergo anesthesia.  The patient had been followed by Dr. Marcelyn Bruins.  However, during the previous hospitalization Dr. Delford Field was consulted from a respiratory standpoint. It was felt that he would best be served by undergoing admission to the ICU where he was placed.  He was put on albuterol nebulizers and sliding scale insulin.  He remained stable, allowing the INR to drift down.  It was down to 1.75 on hospital day #2.  [facility] was consulted to assist with medical management of the patient concerning his anemia and his diabetes.  On hospital day #3 of September 19, 2010, he was seen by Dr. Francena Hanly, who was on-call for [facility].  The INR had come down.  The aspiration from the office showed a white count of over 52,000.  It was felt he would best be served by undergoing surgery.  He was taken to the operating room later that day on September 19, 2010, and underwent an arthroscopic synovectomy and I and D by Dr. Francena Hanly. Cultures were sent.  He was continued on the IV vancomycin which he was started on when he came into the hospital.  He appeared stable from a medical standpoint postoperatively and he was transferred back to the ICU but felt, after that, he could go to the floor.  On postoperative day #1, September 20, 2009, he was found to have the positive UTI, Klebsiella pneumonia and they were waiting on sensitivities.  The patient was noted to have a chronic indwelling Foley  catheter due to bladder dysfunction.  Reviewing the discharge summary from the previous hospitalization back in November to December, the patient had multiple voiding trials and had a Urology consult by Dr. Marcine Matar.  He was started on bethanechol and Flomax.  He had the Foley catheter placed back in and had then had a catheter for quite some time.  On postoperative day #2 his serum white count was 7.7 and hemoglobin was 9.8.  The cultures taken from the knee were negative so far but they were only preliminary.  We did check the culture from the office and it did show streptococcus.  The patient had been sent to [facility] following the previous complicating hospitalization and he wanted to go back there, so we got the social worker involved to help assist with the transfer of the patient once he was stable.  He was switched over to IV Rocephin per Medicine due to the Klebsiella pneumonia sensitivities which were available.  We are waiting on the final culture sensitivities from the knee scope.  Dressing changed on day #2 and day #3 by Dr. Lequita Halt showed that the knee was not having recurring swelling, looked good, the port sites were healing well, had slight effusion but was not starting to balloon up again.  We are watching for that.  The intraoperative knee culture did grow out group B streptococcus and also the office culture was a group A streptococcus and felt to be highly sensitive.  Once the sensitivities came out, they were sensitive to the ceftriaxone.  Postoperative day #4 on September 23, 2010, the patient was seen in rounds.  He was feeling good.  We ordered a PICC line since he would require 6 weeks of IV antibiotics.  We also started PT and OT for therapy to start with some conditioning.  That was started around postoperative day #2.  Postoperative day #3 he was doing much better.  The PICC line got put in on September 23, 2010.  Also on that day he was noted  to be having some shoulder pain.  We looked back and he had some x-rays back in December during the previous hospitalization that only showed some mild degenerative changes.  We did not feel that we needed to do any kind of MRI at that point.  We treated it more just conservatively.  We used  pain medications and ice packs and also a K- Pad.  The patient states that actually the shoulder felt a little bit better after using the K-Pad.  He was seen on postoperative day #5 of September 24, 2010, by Dr. Lequita Halt.  He had his PICC line placed and was receiving the Rocephin.  It was felt that, per the notes, he would be able to return to [facility] today.  He was doing well.  No complaints.  The knee was washed out and appeared stable postoperatively and it was decided that he could be transferred over at that time to [facility].  DISCHARGE PLAN:  The patient will be transferred over to [facility] for continued rehabilitation and care.  DISCHARGE DIAGNOSES:  Please see above.  DISCHARGE MEDICATIONS:  Current medications at the time of dictation include: 1. Lopressor 12.5 mg p.o. b.i.d. 2. Protonix 40 mg daily. 3. Lasix 40 mg daily. 4. Pennsaid solution 1.5%.  The patient is on solution 10 drops     topical q.i.d. 5. Ferrous sulfate 325 mg p.o. b.i.d. 6. Norvasc 5 mg p.o. daily. 7. Lidoderm 5% patch topical daily.  Remove after 12 hours. 8. Bethanechol 25 mg p.o. t.i.d. 9. Simvastatin 20 mg p.o. daily. 10.Potassium 20 mEq p.o. daily. 11.Ensure puddings p.o. t.i.d. with meals. 12.Lantus insulin 20 units subcutaneous q.h.s. 13.Flomax 0.4 mg daily. 14.Lovenox 30 mg subcutaneous injection every 12 hours. 15.Insulin sliding scale. 16.Colace 100 mg p.o. b.i.d. 17.Rocephin 1 gram IV every 24 hours.  He needs this for 39 more days     to complete a 6-week course and then discontinue the Rocephin. 18.Tylenol 325 one or two every 4-6 hours as needed for  pain. 19.Vicodin 5 mg two tablets every 4-6 hours as needed for pain. 20.Benadryl 25 mg p.o. q.6 hours p.r.n. itching. 21.Chloraseptic spray topical p.r.n. sore throat. 22.MiraLax 17 grams p.o. daily p.r.n.  Dissolve in 17 grams and 8     ounces of water. 23.Albuterol inhaler q.2 hours p.r.n. wheezing.  DIET:  Heart-healthy diabetic diet.  ACTIVITY:  He can be weightbearing as tolerated to the right lower extremity.  PT and OT for gait training, ambulation, ADLs, conditioning, exercises and strengthening and mobility.  FOLLOWUP: 1. He needs to follow up with Dr. Ollen Gross in the office next     week on either Tuesday, January 10 or Thursday, January 12.  Please     contact the office at [phone] to arrange followup care of this     patient. 2. He already has a scheduled followup with Pulmonary, Dr. Marcelyn Bruins on January 10 at 10:30 a.m. 3. He already has scheduled followup with Urology, Dr. Marcine Matar in his office on January 12 at 10:15 a.m.  He needs to keep the appointments with Dr. Shelle Iron and Dr. Retta Diones.  He also needs to have the appointment set up with Dr. Lequita Halt.  DISPOSITION:  He is going to [facility].  CONDITION ON DISCHARGE:  Improving.     Alexzandrew L. Julien Girt, P.A.C.   ______________________________ Ollen Gross, M.D.    ALP/MEDQ  D:  09/24/2010  T:  09/24/2010  Job:  [id number]  cc:   Barbaraann Share, MD,FCCP [address] [place] [address]  Bertram Millard. Dahlstedt, M.D. Fax: [phone]  Brendia Sacks, MD  Ollen Gross, M.D. Fax: [phone]  Vania Rea. Supple, M.D. Fax: [phone]  Georges Lynch. Darrelyn Hillock, M.D. Fax: [phone]  Electronically  Signed by Patrica Duel P.A.C. on 10/07/2010 11:09:03 AM Electronically Signed by Ollen Gross M.D. on 10/20/2010 03:33:47 PM

## 2010-10-21 NOTE — Assessment & Plan Note (Signed)
Summary: rov for OHS/mild copd   Copy to:  Windle Guard Primary Provider/Referring Provider:  Windle Guard  CC:  post hosp f/u appt.  states he is feeling "better" since hospital stay.  rarely has a non-productive cough.  still sob with exertion.  wears bipap machine every night.  de.  History of Present Illness: the pt comes in today for posthospital f/u after admission for a septic knee requiring debridment.  He has known OHS for which he is on bipap, and surprisingly did very well during his hospital stay.  He comes in today where he feels his breathing is stable, and denies any congestion or purulence.  He is wearing bipap compliantly, and is having no issue with mask fit or pressure intolerance.  He feels this helps his breathing and sleep.  Current Medications (verified): 1)  Metoprolol Tartrate 25 Mg Tabs (Metoprolol Tartrate) .... Take 1/2 Tab By Mouth Two Times A Day 2)  Furosemide 40 Mg Tabs (Furosemide) .... Take 1 Tablet By Mouth Once A Day 3)  Spiriva Handihaler 18 Mcg  Caps (Tiotropium Bromide Monohydrate) .... One Puff  in Handihaler Daily 4)  Ventolin Hfa 108 (90 Base) Mcg/act  Aers (Albuterol Sulfate) .... 2 Puffs Every 4-6 Hours As Needed 5)  Pantoprazole Sodium 40 Mg Tbec (Pantoprazole Sodium) .... Take 1 Tablet By Mouth Once A Day 6)  Pennsaid 1.5 % Soln (Diclofenac Sodium) .... Apply 10 Drops Topically Four Times A Day 7)  Ferrous Sulfate 325 (65 Fe) Mg Tabs (Ferrous Sulfate) .... Take 1 Tablet By Mouth Two Times A Day 8)  Amlodipine Besylate 5 Mg Tabs (Amlodipine Besylate) .... Take 1 Tablet By Mouth Once A Day 9)  Lidoderm 5 % Ptch (Lidocaine) .... Apply 1 Patch Topically Every Day 10)  Bethanechol Chloride 25 Mg Tabs (Bethanechol Chloride) .... Take 1 Tablet By Mouth Three Times A Day 11)  Simvastatin 20 Mg Tabs (Simvastatin) .... Take 1 Tablet By Mouth Once A Day 12)  Klor-Con 20 Meq Pack (Potassium Chloride) .... Take 1 Tablet By Mouth Once A Day 13)  Lantus 100  Unit/ml Soln (Insulin Glargine) .... Take As Directed 14)  Flomax 0.4 Mg Caps (Tamsulosin Hcl) .... Take 1 Tablet By Mouth Once A Day 15)  Lovenox 30 Mg/0.75ml Soln (Enoxaparin Sodium) .... Inject Subcutaneously Every 12 Hours 16)  Docusate Sodium 100 Mg Caps (Docusate Sodium) .... Take 1 Tablet By Mouth Two Times A Day 17)  Mapap 325 Mg Tabs (Acetaminophen) .... Take 1 To 2 Tabs By Mouth Every 4 To 6 Hours As Needed 18)  Benadryl 25 Mg Caps (Diphenhydramine Hcl) .... Take 1 Tab By Mouth Every 6 Hours As Needed For Itching 19)  Novolog 100 Unit/ml Soln (Insulin Aspart) .... Use As Directed 20)  Rocephin 1 Gm Solr (Ceftriaxone Sodium) 21)  Hydrocodone-Acetaminophen 5-500 Mg Tabs (Hydrocodone-Acetaminophen) .... Take 2 Tabs By Mouth Every 4 To 6 Hours As Needed  Allergies (verified): 1)  ! Quinine  Review of Systems       The patient complains of shortness of breath with activity, non-productive cough, irregular heartbeats, loss of appetite, weight change, itching, depression, hand/feet swelling, joint stiffness or pain, and rash.  The patient denies shortness of breath at rest, productive cough, coughing up blood, chest pain, acid heartburn, indigestion, abdominal pain, difficulty swallowing, sore throat, tooth/dental problems, headaches, nasal congestion/difficulty breathing through nose, sneezing, ear ache, anxiety, change in color of mucus, and fever.    Vital Signs:  Patient profile:  77 year old male Height:      73 inches O2 Sat:      95 % on 2 LPM cont.  Temp:     97.8 degrees F oral Pulse rate:   86 / minute BP sitting:   124 / 70  (right arm) Cuff size:   large  Vitals Entered By: Arman Filter LPN (September 28, 2010 10:32 AM)  O2 Flow:  2 LPM cont.  CC: post hosp f/u appt.  states he is feeling "better" since hospital stay.  rarely has a non-productive cough.  still sob with exertion.  wears bipap machine every night.  de Comments Unable to weigh pt. wheelchair  bound. Medications reviewed with patient Arman Filter LPN  September 28, 2010 10:32 AM    Physical Exam  General:  morbidly obese male in nad Nose:  no skin breakdown or pressure necrosis from cpap mask Lungs:  basilar crackles, no wheezing or rhonchi Heart:  fairly regular, 2/6 sem Extremities:  2+ edema, no cyanosis  Neurologic:  alert, does not appear sleepy, moves all 4.   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  He has no issues with mask fit or pressure at this time.  I have urged him to work on weight loss.  Problem # 2:  EMPHYSEMA, MILD (ICD-492.8)  has very mild obstructive disease on pfts, and really has not been an issue in any of his hospitalizations.  I have asked him to continue on spiriva  Medications Added to Medication List This Visit: 1)  Metoprolol Tartrate 25 Mg Tabs (Metoprolol tartrate) .... Take 1/2 tab by mouth two times a day 2)  Furosemide 40 Mg Tabs (Furosemide) .... Take 1 tablet by mouth once a day 3)  Pantoprazole Sodium 40 Mg Tbec (Pantoprazole sodium) .... Take 1 tablet by mouth once a day 4)  Pennsaid 1.5 % Soln (Diclofenac sodium) .... Apply 10 drops topically four times a day 5)  Ferrous Sulfate 325 (65 Fe) Mg Tabs (Ferrous sulfate) .... Take 1 tablet by mouth two times a day 6)  Amlodipine Besylate 5 Mg Tabs (Amlodipine besylate) .... Take 1 tablet by mouth once a day 7)  Lidoderm 5 % Ptch (Lidocaine) .... Apply 1 patch topically every day 8)  Bethanechol Chloride 25 Mg Tabs (Bethanechol chloride) .... Take 1 tablet by mouth three times a day 9)  Simvastatin 20 Mg Tabs (Simvastatin) .... Take 1 tablet by mouth once a day 10)  Klor-con 20 Meq Pack (Potassium chloride) .... Take 1 tablet by mouth once a day 11)  Lantus 100 Unit/ml Soln (Insulin glargine) .... Take as directed 12)  Flomax 0.4 Mg Caps (Tamsulosin hcl) .... Take 1  tablet by mouth once a day 13)  Lovenox 30 Mg/0.31ml Soln (Enoxaparin sodium) .... Inject subcutaneously every 12 hours 14)  Docusate Sodium 100 Mg Caps (Docusate sodium) .... Take 1 tablet by mouth two times a day 15)  Mapap 325 Mg Tabs (Acetaminophen) .... Take 1 to 2 tabs by mouth every 4 to 6 hours as needed 16)  Benadryl 25 Mg Caps (Diphenhydramine hcl) .... Take 1 tab by mouth every 6 hours as needed for itching 17)  Novolog 100 Unit/ml Soln (Insulin aspart) .... Use as directed 18)  Rocephin 1 Gm Solr (Ceftriaxone sodium) 19)  Hydrocodone-acetaminophen 5-500 Mg Tabs (Hydrocodone-acetaminophen) .... Take 2 tabs by mouth every 4 to 6 hours  as needed  Other Orders: Est. Patient Level III (95621)  Patient Instructions: 1)  continue on bipap everynight 2)  work on weight loss 3)  followup with me in 3mos.

## 2010-11-17 ENCOUNTER — Telehealth: Payer: Self-pay | Admitting: Pulmonary Disease

## 2010-11-25 NOTE — Progress Notes (Signed)
Summary: primary care doctor--to call back with options  Phone Note Call from Patient Call back at Home Phone [phone]   Caller: Spouse-helen Call For: Greydis Stlouis Reason for Call: Talk to Nurse Summary of Call: Patient's wife calling saying patient needs a primary care doctor.  She is asking for Dr. Teddy Spike opinion of who he who recommend. Initial call taken by: Lehman Prom,  November 17, 2010 9:13 AM  Follow-up for Phone Call        Pt PCP is retiring and pt needs a new PCp and they are requestign your recs. Please advsie.Carron Curie CMA  November 17, 2010 11:40 AM   Additional Follow-up for Phone Call Additional follow up Details #1::        let them know there are a lot of good primary md's in [place].  Will need to consider where they live, what practice they wish to be associated with.  Would recommend they ask friends, people at church who they see.  If they come up with 3-4 good candidates, I would be happy to give an opinion about "chemistry" with them. Additional Follow-up by: Barbaraann Share MD,  November 17, 2010 5:52 PM    Additional Follow-up for Phone Call Additional follow up Details #2::    Informed pt's spouse Myriam Jacobson of KC's recs, she advised she has seen Dr. Oliver Barre in the past and would rather not have pt go to him. She will call back with some names is she can or to ask [facility] for [place] MD's. Zackery Barefoot CMA  November 18, 2010 9:00 AM

## 2010-11-29 LAB — CBC
HCT: 31.3 % — ABNORMAL LOW (ref 39.0–52.0)
HCT: 33.5 % — ABNORMAL LOW (ref 39.0–52.0)
Hemoglobin: 9.8 g/dL — ABNORMAL LOW (ref 13.0–17.0)
Hemoglobin: 10.5 g/dL — ABNORMAL LOW (ref 13.0–17.0)
MCH: 26.4 pg (ref 26.0–34.0)
MCH: 27.2 pg (ref 26.0–34.0)
MCH: 26.7 pg (ref 26.0–34.0)
MCHC: 31.2 g/dL (ref 30.0–36.0)
MCV: 85.1 fL (ref 78.0–100.0)
MCV: 85.8 fL (ref 78.0–100.0)
MCV: 85.2 fL (ref 78.0–100.0)
Platelets: 200 10*3/uL (ref 150–400)
Platelets: 208 10*3/uL (ref 150–400)
Platelets: 247 10*3/uL (ref 150–400)
Platelets: 160 10*3/uL (ref 150–400)
RBC: 3.6 MIL/uL — ABNORMAL LOW (ref 4.22–5.81)
RBC: 3.83 MIL/uL — ABNORMAL LOW (ref 4.22–5.81)
RBC: 3.86 MIL/uL — ABNORMAL LOW (ref 4.22–5.81)
RBC: 3.93 MIL/uL — ABNORMAL LOW (ref 4.22–5.81)
RDW: 15.3 % (ref 11.5–15.5)
RDW: 15.5 % (ref 11.5–15.5)
WBC: 11.2 10*3/uL — ABNORMAL HIGH (ref 4.0–10.5)
WBC: 7.7 10*3/uL (ref 4.0–10.5)
WBC: 8.9 10*3/uL (ref 4.0–10.5)

## 2010-11-29 LAB — BASIC METABOLIC PANEL
BUN: 6 mg/dL (ref 6–23)
BUN: 15 mg/dL (ref 6–23)
BUN: 19 mg/dL (ref 6–23)
CO2: 32 mEq/L (ref 19–32)
CO2: 33 mEq/L — ABNORMAL HIGH (ref 19–32)
CO2: 33 mEq/L — ABNORMAL HIGH (ref 19–32)
Calcium: 8.6 mg/dL (ref 8.4–10.5)
Calcium: 8.4 mg/dL (ref 8.4–10.5)
Chloride: 101 mEq/L (ref 96–112)
Chloride: 101 mEq/L (ref 96–112)
Chloride: 101 mEq/L (ref 96–112)
Chloride: 95 mEq/L — ABNORMAL LOW (ref 96–112)
Creatinine, Ser: 1.03 mg/dL (ref 0.4–1.5)
Creatinine, Ser: 1.07 mg/dL (ref 0.4–1.5)
Creatinine, Ser: 1.08 mg/dL (ref 0.4–1.5)
GFR calc Af Amer: >60 mL/min (ref >60)
GFR calc Af Amer: >60 mL/min (ref >60)
GFR calc Af Amer: >60 mL/min (ref >60)
GFR calc Af Amer: >60 mL/min (ref >60)
GFR calc non Af Amer: >60 mL/min (ref >60)
GFR calc non Af Amer: >60 mL/min (ref >60)
Glucose, Bld: 78 mg/dL (ref 70–99)
Glucose, Bld: 99 mg/dL (ref 70–99)
Glucose, Bld: 97 mg/dL (ref 70–99)
Potassium: 3.5 mEq/L (ref 3.5–5.1)
Potassium: 3.7 mEq/L (ref 3.5–5.1)
Potassium: 3.9 mEq/L (ref 3.5–5.1)
Sodium: 140 mEq/L (ref 135–145)
Sodium: 136 mEq/L (ref 135–145)

## 2010-11-29 LAB — COMPREHENSIVE METABOLIC PANEL
ALT: 14 U/L (ref 0–53)
AST: 22 U/L (ref 0–37)
Albumin: 2.5 g/dL — ABNORMAL LOW (ref 3.5–5.2)
Alkaline Phosphatase: 65 U/L (ref 39–117)
Chloride: 101 mEq/L (ref 96–112)
GFR calc Af Amer: >60 mL/min (ref >60)
Potassium: 4.2 mEq/L (ref 3.5–5.1)
Sodium: 140 mEq/L (ref 135–145)
Total Protein: 7.7 g/dL (ref 6.0–8.3)

## 2010-11-29 LAB — GLUCOSE, CAPILLARY
Glucose-Capillary: 100 mg/dL — ABNORMAL HIGH (ref 70–99)
Glucose-Capillary: 113 mg/dL — ABNORMAL HIGH (ref 70–99)
Glucose-Capillary: 119 mg/dL — ABNORMAL HIGH (ref 70–99)
Glucose-Capillary: 126 mg/dL — ABNORMAL HIGH (ref 70–99)
Glucose-Capillary: 168 mg/dL — ABNORMAL HIGH (ref 70–99)
Glucose-Capillary: 91 mg/dL (ref 70–99)
Glucose-Capillary: 96 mg/dL (ref 70–99)
Glucose-Capillary: 110 mg/dL — ABNORMAL HIGH (ref 70–99)
Glucose-Capillary: 127 mg/dL — ABNORMAL HIGH (ref 70–99)
Glucose-Capillary: 129 mg/dL — ABNORMAL HIGH (ref 70–99)
Glucose-Capillary: 140 mg/dL — ABNORMAL HIGH (ref 70–99)
Glucose-Capillary: 153 mg/dL — ABNORMAL HIGH (ref 70–99)
Glucose-Capillary: 192 mg/dL — ABNORMAL HIGH (ref 70–99)
Glucose-Capillary: 92 mg/dL (ref 70–99)
Glucose-Capillary: 92 mg/dL (ref 70–99)

## 2010-11-29 LAB — ANAEROBIC CULTURE

## 2010-11-29 LAB — URINE CULTURE
Colony Count: >=100000
Culture  Setup Time: 201112310256
Culture  Setup Time: 201112311804
Special Requests: NEGATIVE
Special Requests: NEGATIVE

## 2010-11-29 LAB — TYPE AND SCREEN

## 2010-11-29 LAB — URINE MICROSCOPIC-ADD ON

## 2010-11-29 LAB — WOUND CULTURE

## 2010-11-29 LAB — DIFFERENTIAL
Basophils Relative: 0 % (ref 0–1)
Eosinophils Absolute: 0.5 10*3/uL (ref 0.0–0.7)
Eosinophils Absolute: 0.6 10*3/uL (ref 0.0–0.7)
Eosinophils Relative: 5 % (ref 0–5)
Eosinophils Relative: 5 % (ref 0–5)
Lymphs Abs: 3 10*3/uL (ref 0.7–4.0)
Monocytes Absolute: 0.9 10*3/uL (ref 0.1–1.0)
Monocytes Relative: 8 % (ref 3–12)
Monocytes Relative: 9 % (ref 3–12)
Neutro Abs: 6.1 10*3/uL (ref 1.7–7.7)

## 2010-11-29 LAB — URINALYSIS, ROUTINE W REFLEX MICROSCOPIC
Bilirubin Urine: NEGATIVE
Glucose, UA: NEGATIVE mg/dL
Ketones, ur: NEGATIVE mg/dL
Protein, ur: NEGATIVE mg/dL

## 2010-11-29 LAB — PROTIME-INR
INR: 1.79 — ABNORMAL HIGH (ref 0.00–1.49)
INR: 2.13 — ABNORMAL HIGH (ref 0.00–1.49)
INR: 2.21 — ABNORMAL HIGH (ref 0.00–1.49)
Prothrombin Time: 17.7 seconds — ABNORMAL HIGH (ref 11.6–15.2)
Prothrombin Time: 21 seconds — ABNORMAL HIGH (ref 11.6–15.2)
Prothrombin Time: 24 seconds — ABNORMAL HIGH (ref 11.6–15.2)
Prothrombin Time: 24.7 seconds — ABNORMAL HIGH (ref 11.6–15.2)

## 2010-11-29 LAB — APTT: aPTT: 48 seconds — ABNORMAL HIGH (ref 24–37)

## 2010-11-30 LAB — PROTIME-INR
INR: 2.36 — ABNORMAL HIGH (ref 0.00–1.49)
INR: 2.39 — ABNORMAL HIGH (ref 0.00–1.49)
INR: 2.44 — ABNORMAL HIGH (ref 0.00–1.49)
INR: 2.61 — ABNORMAL HIGH (ref 0.00–1.49)
INR: 2.62 — ABNORMAL HIGH (ref 0.00–1.49)
INR: 1.63 — ABNORMAL HIGH (ref 0.00–1.49)
INR: 1.86 — ABNORMAL HIGH (ref 0.00–1.49)
INR: 1.91 — ABNORMAL HIGH (ref 0.00–1.49)
INR: 2.13 — ABNORMAL HIGH (ref 0.00–1.49)
INR: 2.18 — ABNORMAL HIGH (ref 0.00–1.49)
INR: 2.38 — ABNORMAL HIGH (ref 0.00–1.49)
INR: 2.4 — ABNORMAL HIGH (ref 0.00–1.49)
INR: 2.61 — ABNORMAL HIGH (ref 0.00–1.49)
INR: 2.74 — ABNORMAL HIGH (ref 0.00–1.49)
INR: 2.94 — ABNORMAL HIGH (ref 0.00–1.49)
INR: 3.48 — ABNORMAL HIGH (ref 0.00–1.49)
Prothrombin Time: 22.2 seconds — ABNORMAL HIGH (ref 11.6–15.2)
Prothrombin Time: 25.9 seconds — ABNORMAL HIGH (ref 11.6–15.2)
Prothrombin Time: 26.2 seconds — ABNORMAL HIGH (ref 11.6–15.2)
Prothrombin Time: 28 seconds — ABNORMAL HIGH (ref 11.6–15.2)
Prothrombin Time: 28 seconds — ABNORMAL HIGH (ref 11.6–15.2)
Prothrombin Time: 17.1 seconds — ABNORMAL HIGH (ref 11.6–15.2)
Prothrombin Time: 18 seconds — ABNORMAL HIGH (ref 11.6–15.2)
Prothrombin Time: 19.4 seconds — ABNORMAL HIGH (ref 11.6–15.2)
Prothrombin Time: 19.5 seconds — ABNORMAL HIGH (ref 11.6–15.2)
Prothrombin Time: 21.6 seconds — ABNORMAL HIGH (ref 11.6–15.2)
Prothrombin Time: 22 seconds — ABNORMAL HIGH (ref 11.6–15.2)
Prothrombin Time: 22.3 seconds — ABNORMAL HIGH (ref 11.6–15.2)
Prothrombin Time: 24 seconds — ABNORMAL HIGH (ref 11.6–15.2)
Prothrombin Time: 24.4 seconds — ABNORMAL HIGH (ref 11.6–15.2)
Prothrombin Time: 26.3 seconds — ABNORMAL HIGH (ref 11.6–15.2)
Prothrombin Time: 27.8 seconds — ABNORMAL HIGH (ref 11.6–15.2)
Prothrombin Time: 28 seconds — ABNORMAL HIGH (ref 11.6–15.2)

## 2010-11-30 LAB — BASIC METABOLIC PANEL
BUN: 41 mg/dL — ABNORMAL HIGH (ref 6–23)
BUN: 44 mg/dL — ABNORMAL HIGH (ref 6–23)
BUN: 23 mg/dL (ref 6–23)
BUN: 24 mg/dL — ABNORMAL HIGH (ref 6–23)
BUN: 33 mg/dL — ABNORMAL HIGH (ref 6–23)
BUN: 37 mg/dL — ABNORMAL HIGH (ref 6–23)
BUN: 39 mg/dL — ABNORMAL HIGH (ref 6–23)
BUN: 42 mg/dL — ABNORMAL HIGH (ref 6–23)
BUN: 46 mg/dL — ABNORMAL HIGH (ref 6–23)
BUN: 46 mg/dL — ABNORMAL HIGH (ref 6–23)
BUN: 50 mg/dL — ABNORMAL HIGH (ref 6–23)
BUN: 56 mg/dL — ABNORMAL HIGH (ref 6–23)
BUN: 66 mg/dL — ABNORMAL HIGH (ref 6–23)
BUN: 68 mg/dL — ABNORMAL HIGH (ref 6–23)
CO2: 39 mEq/L — ABNORMAL HIGH (ref 19–32)
CO2: 35 mEq/L — ABNORMAL HIGH (ref 19–32)
CO2: 36 mEq/L — ABNORMAL HIGH (ref 19–32)
CO2: 36 mEq/L — ABNORMAL HIGH (ref 19–32)
CO2: 38 mEq/L — ABNORMAL HIGH (ref 19–32)
CO2: 40 mEq/L — ABNORMAL HIGH (ref 19–32)
CO2: 41 mEq/L (ref 19–32)
CO2: 44 mEq/L (ref 19–32)
Calcium: 7.9 mg/dL — ABNORMAL LOW (ref 8.4–10.5)
Calcium: 8.4 mg/dL (ref 8.4–10.5)
Calcium: 8.4 mg/dL (ref 8.4–10.5)
Calcium: 8.5 mg/dL (ref 8.4–10.5)
Calcium: 8.8 mg/dL (ref 8.4–10.5)
Calcium: 8.8 mg/dL (ref 8.4–10.5)
Calcium: 8.8 mg/dL (ref 8.4–10.5)
Calcium: 8.8 mg/dL (ref 8.4–10.5)
Calcium: 9 mg/dL (ref 8.4–10.5)
Calcium: 9.1 mg/dL (ref 8.4–10.5)
Calcium: 9.4 mg/dL (ref 8.4–10.5)
Chloride: 100 mEq/L (ref 96–112)
Chloride: 92 mEq/L — ABNORMAL LOW (ref 96–112)
Chloride: 93 mEq/L — ABNORMAL LOW (ref 96–112)
Chloride: 96 mEq/L (ref 96–112)
Chloride: 98 mEq/L (ref 96–112)
Chloride: 104 mEq/L (ref 96–112)
Chloride: 94 mEq/L — ABNORMAL LOW (ref 96–112)
Chloride: 95 mEq/L — ABNORMAL LOW (ref 96–112)
Chloride: 97 mEq/L (ref 96–112)
Chloride: 98 mEq/L (ref 96–112)
Creatinine, Ser: 1.22 mg/dL (ref 0.4–1.5)
Creatinine, Ser: 1.25 mg/dL (ref 0.4–1.5)
Creatinine, Ser: 0.94 mg/dL (ref 0.4–1.5)
Creatinine, Ser: 0.95 mg/dL (ref 0.4–1.5)
Creatinine, Ser: 1.04 mg/dL (ref 0.4–1.5)
Creatinine, Ser: 1.04 mg/dL (ref 0.4–1.5)
Creatinine, Ser: 1.05 mg/dL (ref 0.4–1.5)
Creatinine, Ser: 1.05 mg/dL (ref 0.4–1.5)
Creatinine, Ser: 1.11 mg/dL (ref 0.4–1.5)
Creatinine, Ser: 1.11 mg/dL (ref 0.4–1.5)
Creatinine, Ser: 1.15 mg/dL (ref 0.4–1.5)
Creatinine, Ser: 1.26 mg/dL (ref 0.4–1.5)
Creatinine, Ser: 1.42 mg/dL (ref 0.4–1.5)
Creatinine, Ser: 2.15 mg/dL — ABNORMAL HIGH (ref 0.4–1.5)
Creatinine, Ser: 2.57 mg/dL — ABNORMAL HIGH (ref 0.4–1.5)
GFR calc Af Amer: >60 mL/min (ref >60)
GFR calc Af Amer: >60 mL/min (ref >60)
GFR calc Af Amer: >60 mL/min (ref >60)
GFR calc Af Amer: >60 mL/min (ref >60)
GFR calc Af Amer: 30 mL/min — ABNORMAL LOW (ref >60)
GFR calc Af Amer: >60 mL/min (ref >60)
GFR calc Af Amer: >60 mL/min (ref >60)
GFR calc Af Amer: >60 mL/min (ref >60)
GFR calc Af Amer: >60 mL/min (ref >60)
GFR calc Af Amer: >60 mL/min (ref >60)
GFR calc Af Amer: >60 mL/min (ref >60)
GFR calc non Af Amer: 51 mL/min — ABNORMAL LOW (ref >60)
GFR calc non Af Amer: 30 mL/min — ABNORMAL LOW (ref >60)
GFR calc non Af Amer: 43 mL/min — ABNORMAL LOW (ref >60)
GFR calc non Af Amer: 45 mL/min — ABNORMAL LOW (ref >60)
GFR calc non Af Amer: 58 mL/min — ABNORMAL LOW (ref >60)
GFR calc non Af Amer: 58 mL/min — ABNORMAL LOW (ref >60)
GFR calc non Af Amer: >60 mL/min (ref >60)
GFR calc non Af Amer: >60 mL/min (ref >60)
GFR calc non Af Amer: >60 mL/min (ref >60)
GFR calc non Af Amer: >60 mL/min (ref >60)
GFR calc non Af Amer: >60 mL/min (ref >60)
GFR calc non Af Amer: >60 mL/min (ref >60)
GFR calc non Af Amer: >60 mL/min (ref >60)
Glucose, Bld: 181 mg/dL — ABNORMAL HIGH (ref 70–99)
Glucose, Bld: 118 mg/dL — ABNORMAL HIGH (ref 70–99)
Glucose, Bld: 143 mg/dL — ABNORMAL HIGH (ref 70–99)
Glucose, Bld: 158 mg/dL — ABNORMAL HIGH (ref 70–99)
Glucose, Bld: 167 mg/dL — ABNORMAL HIGH (ref 70–99)
Glucose, Bld: 175 mg/dL — ABNORMAL HIGH (ref 70–99)
Glucose, Bld: 176 mg/dL — ABNORMAL HIGH (ref 70–99)
Glucose, Bld: 180 mg/dL — ABNORMAL HIGH (ref 70–99)
Glucose, Bld: 187 mg/dL — ABNORMAL HIGH (ref 70–99)
Glucose, Bld: 195 mg/dL — ABNORMAL HIGH (ref 70–99)
Glucose, Bld: 91 mg/dL (ref 70–99)
Potassium: 3.9 mEq/L (ref 3.5–5.1)
Potassium: 3.9 mEq/L (ref 3.5–5.1)
Potassium: 4.3 mEq/L (ref 3.5–5.1)
Potassium: 4.6 mEq/L (ref 3.5–5.1)
Potassium: 3.2 mEq/L — ABNORMAL LOW (ref 3.5–5.1)
Potassium: 3.5 mEq/L (ref 3.5–5.1)
Potassium: 3.7 mEq/L (ref 3.5–5.1)
Potassium: 4.1 mEq/L (ref 3.5–5.1)
Potassium: 4.3 mEq/L (ref 3.5–5.1)
Potassium: 5.6 mEq/L — ABNORMAL HIGH (ref 3.5–5.1)
Sodium: 144 mEq/L (ref 135–145)
Sodium: 134 mEq/L — ABNORMAL LOW (ref 135–145)
Sodium: 139 mEq/L (ref 135–145)
Sodium: 142 mEq/L (ref 135–145)
Sodium: 149 mEq/L — ABNORMAL HIGH (ref 135–145)
Sodium: 150 mEq/L — ABNORMAL HIGH (ref 135–145)

## 2010-11-30 LAB — CBC
HCT: 31.5 % — ABNORMAL LOW (ref 39.0–52.0)
HCT: 33.8 % — ABNORMAL LOW (ref 39.0–52.0)
HCT: 34.6 % — ABNORMAL LOW (ref 39.0–52.0)
HCT: 28.6 % — ABNORMAL LOW (ref 39.0–52.0)
HCT: 28.8 % — ABNORMAL LOW (ref 39.0–52.0)
HCT: 31.4 % — ABNORMAL LOW (ref 39.0–52.0)
HCT: 32.2 % — ABNORMAL LOW (ref 39.0–52.0)
HCT: 33.8 % — ABNORMAL LOW (ref 39.0–52.0)
HCT: 34 % — ABNORMAL LOW (ref 39.0–52.0)
HCT: 34.2 % — ABNORMAL LOW (ref 39.0–52.0)
HCT: 35.7 % — ABNORMAL LOW (ref 39.0–52.0)
Hemoglobin: 10.4 g/dL — ABNORMAL LOW (ref 13.0–17.0)
Hemoglobin: 9.9 g/dL — ABNORMAL LOW (ref 13.0–17.0)
Hemoglobin: 10.4 g/dL — ABNORMAL LOW (ref 13.0–17.0)
Hemoglobin: 10.4 g/dL — ABNORMAL LOW (ref 13.0–17.0)
MCH: 27.2 pg (ref 26.0–34.0)
MCH: 26.1 pg (ref 26.0–34.0)
MCH: 26.2 pg (ref 26.0–34.0)
MCH: 26.2 pg (ref 26.0–34.0)
MCHC: 30.8 g/dL (ref 30.0–36.0)
MCHC: 28.6 g/dL — ABNORMAL LOW (ref 30.0–36.0)
MCHC: 30.4 g/dL (ref 30.0–36.0)
MCHC: 30.8 g/dL (ref 30.0–36.0)
MCHC: 31.2 g/dL (ref 30.0–36.0)
MCHC: 31.3 g/dL (ref 30.0–36.0)
MCV: 86.3 fL (ref 78.0–100.0)
MCV: 88.9 fL (ref 78.0–100.0)
MCV: 84.3 fL (ref 78.0–100.0)
MCV: 85.1 fL (ref 78.0–100.0)
MCV: 85.6 fL (ref 78.0–100.0)
MCV: 89.5 fL (ref 78.0–100.0)
MCV: 89.8 fL (ref 78.0–100.0)
MCV: 90.1 fL (ref 78.0–100.0)
Platelets: 177 10*3/uL (ref 150–400)
Platelets: 206 10*3/uL (ref 150–400)
Platelets: 149 10*3/uL — ABNORMAL LOW (ref 150–400)
Platelets: 165 10*3/uL (ref 150–400)
Platelets: 171 10*3/uL (ref 150–400)
Platelets: 178 10*3/uL (ref 150–400)
Platelets: 180 10*3/uL (ref 150–400)
Platelets: 190 10*3/uL (ref 150–400)
Platelets: 211 10*3/uL (ref 150–400)
Platelets: 222 10*3/uL (ref 150–400)
Platelets: 232 10*3/uL (ref 150–400)
RBC: 3.65 MIL/uL — ABNORMAL LOW (ref 4.22–5.81)
RBC: 3.92 MIL/uL — ABNORMAL LOW (ref 4.22–5.81)
RBC: 3.98 MIL/uL — ABNORMAL LOW (ref 4.22–5.81)
RBC: 3.48 MIL/uL — ABNORMAL LOW (ref 4.22–5.81)
RBC: 3.51 MIL/uL — ABNORMAL LOW (ref 4.22–5.81)
RBC: 3.9 MIL/uL — ABNORMAL LOW (ref 4.22–5.81)
RDW: 18.4 % — ABNORMAL HIGH (ref 11.5–15.5)
RDW: 18.5 % — ABNORMAL HIGH (ref 11.5–15.5)
RDW: 17.9 % — ABNORMAL HIGH (ref 11.5–15.5)
RDW: 17.9 % — ABNORMAL HIGH (ref 11.5–15.5)
RDW: 18 % — ABNORMAL HIGH (ref 11.5–15.5)
RDW: 18 % — ABNORMAL HIGH (ref 11.5–15.5)
RDW: 18.1 % — ABNORMAL HIGH (ref 11.5–15.5)
RDW: 18.1 % — ABNORMAL HIGH (ref 11.5–15.5)
RDW: 18.2 % — ABNORMAL HIGH (ref 11.5–15.5)
RDW: 18.8 % — ABNORMAL HIGH (ref 11.5–15.5)
WBC: 10.2 10*3/uL (ref 4.0–10.5)
WBC: 11.3 10*3/uL — ABNORMAL HIGH (ref 4.0–10.5)
WBC: 12.5 10*3/uL — ABNORMAL HIGH (ref 4.0–10.5)
WBC: 10 10*3/uL (ref 4.0–10.5)
WBC: 10.2 10*3/uL (ref 4.0–10.5)
WBC: 10.5 10*3/uL (ref 4.0–10.5)
WBC: 10.5 10*3/uL (ref 4.0–10.5)
WBC: 11 10*3/uL — ABNORMAL HIGH (ref 4.0–10.5)
WBC: 11.4 10*3/uL — ABNORMAL HIGH (ref 4.0–10.5)
WBC: 13 10*3/uL — ABNORMAL HIGH (ref 4.0–10.5)
WBC: 13.7 10*3/uL — ABNORMAL HIGH (ref 4.0–10.5)
WBC: 9.8 10*3/uL (ref 4.0–10.5)
WBC: 9.8 10*3/uL (ref 4.0–10.5)

## 2010-11-30 LAB — DIFFERENTIAL
Basophils Absolute: 0 10*3/uL (ref 0.0–0.1)
Basophils Absolute: 0.1 10*3/uL (ref 0.0–0.1)
Basophils Absolute: 0.1 10*3/uL (ref 0.0–0.1)
Basophils Absolute: 0.1 10*3/uL (ref 0.0–0.1)
Basophils Absolute: 0.1 10*3/uL (ref 0.0–0.1)
Basophils Relative: 1 % (ref 0–1)
Basophils Relative: 1 % (ref 0–1)
Basophils Relative: 1 % (ref 0–1)
Eosinophils Absolute: 0.7 10*3/uL (ref 0.0–0.7)
Eosinophils Relative: 3 % (ref 0–5)
Eosinophils Relative: 5 % (ref 0–5)
Eosinophils Relative: 5 % (ref 0–5)
Eosinophils Relative: 7 % — ABNORMAL HIGH (ref 0–5)
Eosinophils Relative: 7 % — ABNORMAL HIGH (ref 0–5)
Eosinophils Relative: 7 % — ABNORMAL HIGH (ref 0–5)
Lymphocytes Relative: 24 % (ref 12–46)
Lymphocytes Relative: 27 % (ref 12–46)
Lymphocytes Relative: 30 % (ref 12–46)
Lymphocytes Relative: 32 % (ref 12–46)
Lymphocytes Relative: 33 % (ref 12–46)
Lymphocytes Relative: 34 % (ref 12–46)
Lymphs Abs: 2.8 10*3/uL (ref 0.7–4.0)
Lymphs Abs: 3.6 10*3/uL (ref 0.7–4.0)
Monocytes Absolute: 0.9 10*3/uL (ref 0.1–1.0)
Monocytes Absolute: 1.2 10*3/uL — ABNORMAL HIGH (ref 0.1–1.0)
Neutro Abs: 4.9 10*3/uL (ref 1.7–7.7)
Neutro Abs: 5.2 10*3/uL (ref 1.7–7.7)
Neutro Abs: 6.1 10*3/uL (ref 1.7–7.7)
Neutrophils Relative %: 50 % (ref 43–77)
Neutrophils Relative %: 54 % (ref 43–77)
Neutrophils Relative %: 65 % (ref 43–77)

## 2010-11-30 LAB — GLUCOSE, CAPILLARY
Glucose-Capillary: 106 mg/dL — ABNORMAL HIGH (ref 70–99)
Glucose-Capillary: 107 mg/dL — ABNORMAL HIGH (ref 70–99)
Glucose-Capillary: 116 mg/dL — ABNORMAL HIGH (ref 70–99)
Glucose-Capillary: 129 mg/dL — ABNORMAL HIGH (ref 70–99)
Glucose-Capillary: 129 mg/dL — ABNORMAL HIGH (ref 70–99)
Glucose-Capillary: 134 mg/dL — ABNORMAL HIGH (ref 70–99)
Glucose-Capillary: 138 mg/dL — ABNORMAL HIGH (ref 70–99)
Glucose-Capillary: 140 mg/dL — ABNORMAL HIGH (ref 70–99)
Glucose-Capillary: 146 mg/dL — ABNORMAL HIGH (ref 70–99)
Glucose-Capillary: 148 mg/dL — ABNORMAL HIGH (ref 70–99)
Glucose-Capillary: 157 mg/dL — ABNORMAL HIGH (ref 70–99)
Glucose-Capillary: 158 mg/dL — ABNORMAL HIGH (ref 70–99)
Glucose-Capillary: 159 mg/dL — ABNORMAL HIGH (ref 70–99)
Glucose-Capillary: 163 mg/dL — ABNORMAL HIGH (ref 70–99)
Glucose-Capillary: 165 mg/dL — ABNORMAL HIGH (ref 70–99)
Glucose-Capillary: 165 mg/dL — ABNORMAL HIGH (ref 70–99)
Glucose-Capillary: 167 mg/dL — ABNORMAL HIGH (ref 70–99)
Glucose-Capillary: 169 mg/dL — ABNORMAL HIGH (ref 70–99)
Glucose-Capillary: 170 mg/dL — ABNORMAL HIGH (ref 70–99)
Glucose-Capillary: 177 mg/dL — ABNORMAL HIGH (ref 70–99)
Glucose-Capillary: 178 mg/dL — ABNORMAL HIGH (ref 70–99)
Glucose-Capillary: 181 mg/dL — ABNORMAL HIGH (ref 70–99)
Glucose-Capillary: 183 mg/dL — ABNORMAL HIGH (ref 70–99)
Glucose-Capillary: 189 mg/dL — ABNORMAL HIGH (ref 70–99)
Glucose-Capillary: 192 mg/dL — ABNORMAL HIGH (ref 70–99)
Glucose-Capillary: 195 mg/dL — ABNORMAL HIGH (ref 70–99)
Glucose-Capillary: 200 mg/dL — ABNORMAL HIGH (ref 70–99)
Glucose-Capillary: 201 mg/dL — ABNORMAL HIGH (ref 70–99)
Glucose-Capillary: 202 mg/dL — ABNORMAL HIGH (ref 70–99)
Glucose-Capillary: 203 mg/dL — ABNORMAL HIGH (ref 70–99)
Glucose-Capillary: 203 mg/dL — ABNORMAL HIGH (ref 70–99)
Glucose-Capillary: 205 mg/dL — ABNORMAL HIGH (ref 70–99)
Glucose-Capillary: 206 mg/dL — ABNORMAL HIGH (ref 70–99)
Glucose-Capillary: 207 mg/dL — ABNORMAL HIGH (ref 70–99)
Glucose-Capillary: 224 mg/dL — ABNORMAL HIGH (ref 70–99)
Glucose-Capillary: 72 mg/dL (ref 70–99)
Glucose-Capillary: 78 mg/dL (ref 70–99)
Glucose-Capillary: 82 mg/dL (ref 70–99)
Glucose-Capillary: 84 mg/dL (ref 70–99)
Glucose-Capillary: 87 mg/dL (ref 70–99)
Glucose-Capillary: 117 mg/dL — ABNORMAL HIGH (ref 70–99)
Glucose-Capillary: 129 mg/dL — ABNORMAL HIGH (ref 70–99)
Glucose-Capillary: 133 mg/dL — ABNORMAL HIGH (ref 70–99)
Glucose-Capillary: 133 mg/dL — ABNORMAL HIGH (ref 70–99)
Glucose-Capillary: 140 mg/dL — ABNORMAL HIGH (ref 70–99)
Glucose-Capillary: 144 mg/dL — ABNORMAL HIGH (ref 70–99)
Glucose-Capillary: 144 mg/dL — ABNORMAL HIGH (ref 70–99)
Glucose-Capillary: 145 mg/dL — ABNORMAL HIGH (ref 70–99)
Glucose-Capillary: 145 mg/dL — ABNORMAL HIGH (ref 70–99)
Glucose-Capillary: 147 mg/dL — ABNORMAL HIGH (ref 70–99)
Glucose-Capillary: 148 mg/dL — ABNORMAL HIGH (ref 70–99)
Glucose-Capillary: 148 mg/dL — ABNORMAL HIGH (ref 70–99)
Glucose-Capillary: 149 mg/dL — ABNORMAL HIGH (ref 70–99)
Glucose-Capillary: 149 mg/dL — ABNORMAL HIGH (ref 70–99)
Glucose-Capillary: 152 mg/dL — ABNORMAL HIGH (ref 70–99)
Glucose-Capillary: 154 mg/dL — ABNORMAL HIGH (ref 70–99)
Glucose-Capillary: 154 mg/dL — ABNORMAL HIGH (ref 70–99)
Glucose-Capillary: 155 mg/dL — ABNORMAL HIGH (ref 70–99)
Glucose-Capillary: 155 mg/dL — ABNORMAL HIGH (ref 70–99)
Glucose-Capillary: 155 mg/dL — ABNORMAL HIGH (ref 70–99)
Glucose-Capillary: 156 mg/dL — ABNORMAL HIGH (ref 70–99)
Glucose-Capillary: 157 mg/dL — ABNORMAL HIGH (ref 70–99)
Glucose-Capillary: 157 mg/dL — ABNORMAL HIGH (ref 70–99)
Glucose-Capillary: 157 mg/dL — ABNORMAL HIGH (ref 70–99)
Glucose-Capillary: 158 mg/dL — ABNORMAL HIGH (ref 70–99)
Glucose-Capillary: 158 mg/dL — ABNORMAL HIGH (ref 70–99)
Glucose-Capillary: 161 mg/dL — ABNORMAL HIGH (ref 70–99)
Glucose-Capillary: 161 mg/dL — ABNORMAL HIGH (ref 70–99)
Glucose-Capillary: 161 mg/dL — ABNORMAL HIGH (ref 70–99)
Glucose-Capillary: 163 mg/dL — ABNORMAL HIGH (ref 70–99)
Glucose-Capillary: 163 mg/dL — ABNORMAL HIGH (ref 70–99)
Glucose-Capillary: 163 mg/dL — ABNORMAL HIGH (ref 70–99)
Glucose-Capillary: 164 mg/dL — ABNORMAL HIGH (ref 70–99)
Glucose-Capillary: 164 mg/dL — ABNORMAL HIGH (ref 70–99)
Glucose-Capillary: 165 mg/dL — ABNORMAL HIGH (ref 70–99)
Glucose-Capillary: 166 mg/dL — ABNORMAL HIGH (ref 70–99)
Glucose-Capillary: 166 mg/dL — ABNORMAL HIGH (ref 70–99)
Glucose-Capillary: 168 mg/dL — ABNORMAL HIGH (ref 70–99)
Glucose-Capillary: 169 mg/dL — ABNORMAL HIGH (ref 70–99)
Glucose-Capillary: 169 mg/dL — ABNORMAL HIGH (ref 70–99)
Glucose-Capillary: 170 mg/dL — ABNORMAL HIGH (ref 70–99)
Glucose-Capillary: 172 mg/dL — ABNORMAL HIGH (ref 70–99)
Glucose-Capillary: 173 mg/dL — ABNORMAL HIGH (ref 70–99)
Glucose-Capillary: 173 mg/dL — ABNORMAL HIGH (ref 70–99)
Glucose-Capillary: 173 mg/dL — ABNORMAL HIGH (ref 70–99)
Glucose-Capillary: 173 mg/dL — ABNORMAL HIGH (ref 70–99)
Glucose-Capillary: 175 mg/dL — ABNORMAL HIGH (ref 70–99)
Glucose-Capillary: 175 mg/dL — ABNORMAL HIGH (ref 70–99)
Glucose-Capillary: 176 mg/dL — ABNORMAL HIGH (ref 70–99)
Glucose-Capillary: 177 mg/dL — ABNORMAL HIGH (ref 70–99)
Glucose-Capillary: 177 mg/dL — ABNORMAL HIGH (ref 70–99)
Glucose-Capillary: 178 mg/dL — ABNORMAL HIGH (ref 70–99)
Glucose-Capillary: 178 mg/dL — ABNORMAL HIGH (ref 70–99)
Glucose-Capillary: 180 mg/dL — ABNORMAL HIGH (ref 70–99)
Glucose-Capillary: 181 mg/dL — ABNORMAL HIGH (ref 70–99)
Glucose-Capillary: 181 mg/dL — ABNORMAL HIGH (ref 70–99)
Glucose-Capillary: 182 mg/dL — ABNORMAL HIGH (ref 70–99)
Glucose-Capillary: 182 mg/dL — ABNORMAL HIGH (ref 70–99)
Glucose-Capillary: 182 mg/dL — ABNORMAL HIGH (ref 70–99)
Glucose-Capillary: 185 mg/dL — ABNORMAL HIGH (ref 70–99)
Glucose-Capillary: 185 mg/dL — ABNORMAL HIGH (ref 70–99)
Glucose-Capillary: 186 mg/dL — ABNORMAL HIGH (ref 70–99)
Glucose-Capillary: 187 mg/dL — ABNORMAL HIGH (ref 70–99)
Glucose-Capillary: 188 mg/dL — ABNORMAL HIGH (ref 70–99)
Glucose-Capillary: 188 mg/dL — ABNORMAL HIGH (ref 70–99)
Glucose-Capillary: 189 mg/dL — ABNORMAL HIGH (ref 70–99)
Glucose-Capillary: 191 mg/dL — ABNORMAL HIGH (ref 70–99)
Glucose-Capillary: 191 mg/dL — ABNORMAL HIGH (ref 70–99)
Glucose-Capillary: 192 mg/dL — ABNORMAL HIGH (ref 70–99)
Glucose-Capillary: 193 mg/dL — ABNORMAL HIGH (ref 70–99)
Glucose-Capillary: 195 mg/dL — ABNORMAL HIGH (ref 70–99)
Glucose-Capillary: 196 mg/dL — ABNORMAL HIGH (ref 70–99)
Glucose-Capillary: 197 mg/dL — ABNORMAL HIGH (ref 70–99)
Glucose-Capillary: 199 mg/dL — ABNORMAL HIGH (ref 70–99)
Glucose-Capillary: 200 mg/dL — ABNORMAL HIGH (ref 70–99)
Glucose-Capillary: 203 mg/dL — ABNORMAL HIGH (ref 70–99)
Glucose-Capillary: 204 mg/dL — ABNORMAL HIGH (ref 70–99)
Glucose-Capillary: 206 mg/dL — ABNORMAL HIGH (ref 70–99)
Glucose-Capillary: 209 mg/dL — ABNORMAL HIGH (ref 70–99)
Glucose-Capillary: 210 mg/dL — ABNORMAL HIGH (ref 70–99)
Glucose-Capillary: 212 mg/dL — ABNORMAL HIGH (ref 70–99)
Glucose-Capillary: 216 mg/dL — ABNORMAL HIGH (ref 70–99)
Glucose-Capillary: 216 mg/dL — ABNORMAL HIGH (ref 70–99)
Glucose-Capillary: 228 mg/dL — ABNORMAL HIGH (ref 70–99)

## 2010-11-30 LAB — BLOOD GAS, ARTERIAL
Acid-Base Excess: 8.8 mmol/L — ABNORMAL HIGH (ref 0.0–2.0)
Bicarbonate: 38 mEq/L — ABNORMAL HIGH (ref 20.0–24.0)
Bicarbonate: 41.7 mEq/L — ABNORMAL HIGH (ref 20.0–24.0)
Drawn by: 24487
Drawn by: 249101
Drawn by: 28120
FIO2: 0.4 %
FIO2: 0.4 %
MECHVT: 500 mL
O2 Saturation: 92 %
PEEP: 5 cmH2O
PEEP: 5 cmH2O
Patient temperature: 98.6
Pressure control: 30 cmH2O
Pressure control: 30 cmH2O
RATE: 14 resp/min
RATE: 14 resp/min
pCO2 arterial: 67.2 mmHg (ref 35.0–45.0)
pCO2 arterial: 72.8 mmHg (ref 35.0–45.0)
pH, Arterial: 7.249 — ABNORMAL LOW (ref 7.350–7.450)
pH, Arterial: 7.333 — ABNORMAL LOW (ref 7.350–7.450)
pH, Arterial: 7.393 (ref 7.350–7.450)
pO2, Arterial: 262 mmHg — ABNORMAL HIGH (ref 80.0–100.0)
pO2, Arterial: 68 mmHg — ABNORMAL LOW (ref 80.0–100.0)
pO2, Arterial: 85.1 mmHg (ref 80.0–100.0)

## 2010-11-30 LAB — URINALYSIS, MICROSCOPIC ONLY
Bilirubin Urine: NEGATIVE
Glucose, UA: NEGATIVE mg/dL
Specific Gravity, Urine: 1.011 (ref 1.005–1.030)
Urobilinogen, UA: 0.2 mg/dL (ref 0.0–1.0)
pH: 8 (ref 5.0–8.0)

## 2010-11-30 LAB — URINALYSIS, ROUTINE W REFLEX MICROSCOPIC
Bilirubin Urine: NEGATIVE
Nitrite: NEGATIVE
Protein, ur: 30 mg/dL — AB
Specific Gravity, Urine: 1.014 (ref 1.005–1.030)
Urobilinogen, UA: 0.2 mg/dL (ref 0.0–1.0)
Urobilinogen, UA: 0.2 mg/dL (ref 0.0–1.0)
pH: 6 (ref 5.0–8.0)

## 2010-11-30 LAB — MAGNESIUM
Magnesium: 1.7 mg/dL (ref 1.5–2.5)
Magnesium: 1.9 mg/dL (ref 1.5–2.5)
Magnesium: 2.1 mg/dL (ref 1.5–2.5)

## 2010-11-30 LAB — COMPREHENSIVE METABOLIC PANEL
Alkaline Phosphatase: 94 U/L (ref 39–117)
BUN: 43 mg/dL — ABNORMAL HIGH (ref 6–23)
BUN: 37 mg/dL — ABNORMAL HIGH (ref 6–23)
CO2: 37 mEq/L — ABNORMAL HIGH (ref 19–32)
Calcium: 8.5 mg/dL (ref 8.4–10.5)
Chloride: 98 mEq/L (ref 96–112)
Creatinine, Ser: 1.36 mg/dL (ref 0.4–1.5)
Creatinine, Ser: 1.1 mg/dL (ref 0.4–1.5)
GFR calc non Af Amer: 51 mL/min — ABNORMAL LOW (ref >60)
GFR calc non Af Amer: >60 mL/min (ref >60)
Glucose, Bld: 166 mg/dL — ABNORMAL HIGH (ref 70–99)
Glucose, Bld: 162 mg/dL — ABNORMAL HIGH (ref 70–99)
Potassium: 4.1 mEq/L (ref 3.5–5.1)
Sodium: 138 mEq/L (ref 135–145)
Total Bilirubin: 0.5 mg/dL (ref 0.3–1.2)
Total Protein: 7.5 g/dL (ref 6.0–8.3)

## 2010-11-30 LAB — URINE CULTURE
Colony Count: >=100000
Colony Count: NO GROWTH
Culture  Setup Time: 201112031727
Culture  Setup Time: 201111231148
Culture: NO GROWTH
Special Requests: POSITIVE

## 2010-11-30 LAB — URINE MICROSCOPIC-ADD ON

## 2010-11-30 LAB — PROCALCITONIN: Procalcitonin: <0.1 ng/mL

## 2010-11-30 LAB — CULTURE, BAL-QUANTITATIVE W GRAM STAIN

## 2010-11-30 LAB — PHOSPHORUS
Phosphorus: 3.1 mg/dL (ref 2.3–4.6)
Phosphorus: 3.9 mg/dL (ref 2.3–4.6)

## 2010-11-30 LAB — CULTURE, BLOOD (ROUTINE X 2)
Culture: NO GROWTH
Culture: NO GROWTH

## 2010-11-30 LAB — BRAIN NATRIURETIC PEPTIDE: Pro B Natriuretic peptide (BNP): 121 pg/mL — ABNORMAL HIGH (ref 0.0–100.0)

## 2010-11-30 LAB — LACTIC ACID, PLASMA: Lactic Acid, Venous: 2.1 mmol/L (ref 0.5–2.2)

## 2010-12-01 LAB — DIFFERENTIAL
Band Neutrophils: 0 % (ref 0–10)
Basophils Absolute: 0 10*3/uL (ref 0.0–0.1)
Basophils Absolute: 0 10*3/uL (ref 0.0–0.1)
Basophils Absolute: 0.1 10*3/uL (ref 0.0–0.1)
Basophils Absolute: 0.1 10*3/uL (ref 0.0–0.1)
Basophils Absolute: 0.1 10*3/uL (ref 0.0–0.1)
Basophils Absolute: 0.1 10*3/uL (ref 0.0–0.1)
Basophils Absolute: 0.1 10*3/uL (ref 0.0–0.1)
Basophils Absolute: 0.1 10*3/uL (ref 0.0–0.1)
Basophils Absolute: 0.1 10*3/uL (ref 0.0–0.1)
Basophils Absolute: 0.1 10*3/uL (ref 0.0–0.1)
Basophils Absolute: 0.1 10*3/uL (ref 0.0–0.1)
Basophils Absolute: 0.1 K/uL (ref 0.0–0.1)
Basophils Relative: 0 % (ref 0–1)
Basophils Relative: 1 % (ref 0–1)
Basophils Relative: 1 % (ref 0–1)
Basophils Relative: 1 % (ref 0–1)
Basophils Relative: 1 % (ref 0–1)
Basophils Relative: 1 % (ref 0–1)
Basophils Relative: 1 % (ref 0–1)
Basophils Relative: 1 % (ref 0–1)
Blasts: 0 %
Eosinophils Absolute: 0.3 10*3/uL (ref 0.0–0.7)
Eosinophils Absolute: 0.3 10*3/uL (ref 0.0–0.7)
Eosinophils Absolute: 0.4 10*3/uL (ref 0.0–0.7)
Eosinophils Absolute: 0.5 10*3/uL (ref 0.0–0.7)
Eosinophils Absolute: 0.5 10*3/uL (ref 0.0–0.7)
Eosinophils Absolute: 0.5 10*3/uL (ref 0.0–0.7)
Eosinophils Absolute: 0.7 K/uL (ref 0.0–0.7)
Eosinophils Relative: 3 % (ref 0–5)
Eosinophils Relative: 4 % (ref 0–5)
Eosinophils Relative: 4 % (ref 0–5)
Eosinophils Relative: 4 % (ref 0–5)
Eosinophils Relative: 5 % (ref 0–5)
Eosinophils Relative: 5 % (ref 0–5)
Eosinophils Relative: 5 % (ref 0–5)
Eosinophils Relative: 6 % — ABNORMAL HIGH (ref 0–5)
Eosinophils Relative: 6 % — ABNORMAL HIGH (ref 0–5)
Eosinophils Relative: 7 % — ABNORMAL HIGH (ref 0–5)
Eosinophils Relative: 9 % — ABNORMAL HIGH (ref 0–5)
Lymphocytes Relative: 12 % (ref 12–46)
Lymphocytes Relative: 23 % (ref 12–46)
Lymphocytes Relative: 25 % (ref 12–46)
Lymphocytes Relative: 26 % (ref 12–46)
Lymphocytes Relative: 27 % (ref 12–46)
Lymphocytes Relative: 27 % (ref 12–46)
Lymphocytes Relative: 27 % (ref 12–46)
Lymphocytes Relative: 29 % (ref 12–46)
Lymphocytes Relative: 31 % (ref 12–46)
Lymphs Abs: 1.4 10*3/uL (ref 0.7–4.0)
Lymphs Abs: 2 10*3/uL (ref 0.7–4.0)
Lymphs Abs: 2.2 10*3/uL (ref 0.7–4.0)
Lymphs Abs: 2.2 K/uL (ref 0.7–4.0)
Lymphs Abs: 2.3 10*3/uL (ref 0.7–4.0)
Lymphs Abs: 2.4 10*3/uL (ref 0.7–4.0)
Lymphs Abs: 2.6 10*3/uL (ref 0.7–4.0)
Lymphs Abs: 2.8 10*3/uL (ref 0.7–4.0)
Lymphs Abs: 3.1 10*3/uL (ref 0.7–4.0)
Lymphs Abs: 3.3 10*3/uL (ref 0.7–4.0)
Metamyelocytes Relative: 0 %
Monocytes Absolute: 0.5 10*3/uL (ref 0.1–1.0)
Monocytes Absolute: 0.7 10*3/uL (ref 0.1–1.0)
Monocytes Absolute: 0.7 10*3/uL (ref 0.1–1.0)
Monocytes Absolute: 0.7 10*3/uL (ref 0.1–1.0)
Monocytes Absolute: 0.8 10*3/uL (ref 0.1–1.0)
Monocytes Absolute: 0.8 K/uL (ref 0.1–1.0)
Monocytes Absolute: 0.9 10*3/uL (ref 0.1–1.0)
Monocytes Absolute: 0.9 10*3/uL (ref 0.1–1.0)
Monocytes Absolute: 0.9 10*3/uL (ref 0.1–1.0)
Monocytes Absolute: 1.1 10*3/uL — ABNORMAL HIGH (ref 0.1–1.0)
Monocytes Relative: 10 % (ref 3–12)
Monocytes Relative: 10 % (ref 3–12)
Monocytes Relative: 7 % (ref 3–12)
Monocytes Relative: 8 % (ref 3–12)
Monocytes Relative: 8 % (ref 3–12)
Monocytes Relative: 9 % (ref 3–12)
Monocytes Relative: 9 % (ref 3–12)
Monocytes Relative: 9 % (ref 3–12)
Neutro Abs: 4.3 K/uL (ref 1.7–7.7)
Neutro Abs: 4.9 10*3/uL (ref 1.7–7.7)
Neutro Abs: 4.9 10*3/uL (ref 1.7–7.7)
Neutro Abs: 5.4 10*3/uL (ref 1.7–7.7)
Neutro Abs: 5.5 10*3/uL (ref 1.7–7.7)
Neutro Abs: 5.9 10*3/uL (ref 1.7–7.7)
Neutro Abs: 5.9 10*3/uL (ref 1.7–7.7)
Neutro Abs: 6.8 10*3/uL (ref 1.7–7.7)
Neutro Abs: 8.5 10*3/uL — ABNORMAL HIGH (ref 1.7–7.7)
Neutrophils Relative %: 53 % (ref 43–77)
Neutrophils Relative %: 54 % (ref 43–77)
Neutrophils Relative %: 56 % (ref 43–77)
Neutrophils Relative %: 58 % (ref 43–77)
Neutrophils Relative %: 59 % (ref 43–77)
Neutrophils Relative %: 60 % (ref 43–77)
Neutrophils Relative %: 64 % (ref 43–77)
Neutrophils Relative %: 65 % (ref 43–77)
Promyelocytes Absolute: 0 %

## 2010-12-01 LAB — GLUCOSE, CAPILLARY
Glucose-Capillary: 107 mg/dL — ABNORMAL HIGH (ref 70–99)
Glucose-Capillary: 117 mg/dL — ABNORMAL HIGH (ref 70–99)
Glucose-Capillary: 117 mg/dL — ABNORMAL HIGH (ref 70–99)
Glucose-Capillary: 118 mg/dL — ABNORMAL HIGH (ref 70–99)
Glucose-Capillary: 118 mg/dL — ABNORMAL HIGH (ref 70–99)
Glucose-Capillary: 121 mg/dL — ABNORMAL HIGH (ref 70–99)
Glucose-Capillary: 138 mg/dL — ABNORMAL HIGH (ref 70–99)
Glucose-Capillary: 138 mg/dL — ABNORMAL HIGH (ref 70–99)
Glucose-Capillary: 138 mg/dL — ABNORMAL HIGH (ref 70–99)
Glucose-Capillary: 138 mg/dL — ABNORMAL HIGH (ref 70–99)
Glucose-Capillary: 140 mg/dL — ABNORMAL HIGH (ref 70–99)
Glucose-Capillary: 140 mg/dL — ABNORMAL HIGH (ref 70–99)
Glucose-Capillary: 141 mg/dL — ABNORMAL HIGH (ref 70–99)
Glucose-Capillary: 142 mg/dL — ABNORMAL HIGH (ref 70–99)
Glucose-Capillary: 142 mg/dL — ABNORMAL HIGH (ref 70–99)
Glucose-Capillary: 147 mg/dL — ABNORMAL HIGH (ref 70–99)
Glucose-Capillary: 148 mg/dL — ABNORMAL HIGH (ref 70–99)
Glucose-Capillary: 153 mg/dL — ABNORMAL HIGH (ref 70–99)
Glucose-Capillary: 157 mg/dL — ABNORMAL HIGH (ref 70–99)
Glucose-Capillary: 157 mg/dL — ABNORMAL HIGH (ref 70–99)
Glucose-Capillary: 158 mg/dL — ABNORMAL HIGH (ref 70–99)
Glucose-Capillary: 158 mg/dL — ABNORMAL HIGH (ref 70–99)
Glucose-Capillary: 161 mg/dL — ABNORMAL HIGH (ref 70–99)
Glucose-Capillary: 163 mg/dL — ABNORMAL HIGH (ref 70–99)
Glucose-Capillary: 164 mg/dL — ABNORMAL HIGH (ref 70–99)
Glucose-Capillary: 165 mg/dL — ABNORMAL HIGH (ref 70–99)
Glucose-Capillary: 166 mg/dL — ABNORMAL HIGH (ref 70–99)
Glucose-Capillary: 167 mg/dL — ABNORMAL HIGH (ref 70–99)
Glucose-Capillary: 169 mg/dL — ABNORMAL HIGH (ref 70–99)
Glucose-Capillary: 170 mg/dL — ABNORMAL HIGH (ref 70–99)
Glucose-Capillary: 171 mg/dL — ABNORMAL HIGH (ref 70–99)
Glucose-Capillary: 171 mg/dL — ABNORMAL HIGH (ref 70–99)
Glucose-Capillary: 176 mg/dL — ABNORMAL HIGH (ref 70–99)
Glucose-Capillary: 178 mg/dL — ABNORMAL HIGH (ref 70–99)
Glucose-Capillary: 178 mg/dL — ABNORMAL HIGH (ref 70–99)
Glucose-Capillary: 178 mg/dL — ABNORMAL HIGH (ref 70–99)
Glucose-Capillary: 178 mg/dL — ABNORMAL HIGH (ref 70–99)
Glucose-Capillary: 179 mg/dL — ABNORMAL HIGH (ref 70–99)
Glucose-Capillary: 188 mg/dL — ABNORMAL HIGH (ref 70–99)
Glucose-Capillary: 195 mg/dL — ABNORMAL HIGH (ref 70–99)
Glucose-Capillary: 195 mg/dL — ABNORMAL HIGH (ref 70–99)
Glucose-Capillary: 197 mg/dL — ABNORMAL HIGH (ref 70–99)
Glucose-Capillary: 202 mg/dL — ABNORMAL HIGH (ref 70–99)
Glucose-Capillary: 203 mg/dL — ABNORMAL HIGH (ref 70–99)
Glucose-Capillary: 204 mg/dL — ABNORMAL HIGH (ref 70–99)
Glucose-Capillary: 208 mg/dL — ABNORMAL HIGH (ref 70–99)
Glucose-Capillary: 208 mg/dL — ABNORMAL HIGH (ref 70–99)
Glucose-Capillary: 214 mg/dL — ABNORMAL HIGH (ref 70–99)
Glucose-Capillary: 217 mg/dL — ABNORMAL HIGH (ref 70–99)
Glucose-Capillary: 218 mg/dL — ABNORMAL HIGH (ref 70–99)

## 2010-12-01 LAB — CBC
HCT: 24.3 % — ABNORMAL LOW (ref 39.0–52.0)
HCT: 24.3 % — ABNORMAL LOW (ref 39.0–52.0)
HCT: 24.5 % — ABNORMAL LOW (ref 39.0–52.0)
HCT: 25.2 % — ABNORMAL LOW (ref 39.0–52.0)
HCT: 25.6 % — ABNORMAL LOW (ref 39.0–52.0)
HCT: 26.8 % — ABNORMAL LOW (ref 39.0–52.0)
HCT: 27.7 % — ABNORMAL LOW (ref 39.0–52.0)
HCT: 29.2 % — ABNORMAL LOW (ref 39.0–52.0)
HCT: 29.4 % — ABNORMAL LOW (ref 39.0–52.0)
HCT: 29.9 % — ABNORMAL LOW (ref 39.0–52.0)
HCT: 30.7 % — ABNORMAL LOW (ref 39.0–52.0)
HCT: 31.3 % — ABNORMAL LOW (ref 39.0–52.0)
Hemoglobin: 7.2 g/dL — ABNORMAL LOW (ref 13.0–17.0)
Hemoglobin: 7.8 g/dL — ABNORMAL LOW (ref 13.0–17.0)
Hemoglobin: 8.5 g/dL — ABNORMAL LOW (ref 13.0–17.0)
Hemoglobin: 8.8 g/dL — ABNORMAL LOW (ref 13.0–17.0)
Hemoglobin: 9.2 g/dL — ABNORMAL LOW (ref 13.0–17.0)
Hemoglobin: 9.3 g/dL — ABNORMAL LOW (ref 13.0–17.0)
MCH: 24.7 pg — ABNORMAL LOW (ref 26.0–34.0)
MCH: 24.9 pg — ABNORMAL LOW (ref 26.0–34.0)
MCH: 25 pg — ABNORMAL LOW (ref 26.0–34.0)
MCH: 25.2 pg — ABNORMAL LOW (ref 26.0–34.0)
MCH: 25.2 pg — ABNORMAL LOW (ref 26.0–34.0)
MCH: 25.5 pg — ABNORMAL LOW (ref 26.0–34.0)
MCH: 25.5 pg — ABNORMAL LOW (ref 26.0–34.0)
MCH: 25.7 pg — ABNORMAL LOW (ref 26.0–34.0)
MCH: 25.8 pg — ABNORMAL LOW (ref 26.0–34.0)
MCH: 25.8 pg — ABNORMAL LOW (ref 26.0–34.0)
MCH: 26 pg (ref 26.0–34.0)
MCHC: 29.1 g/dL — ABNORMAL LOW (ref 30.0–36.0)
MCHC: 29.2 g/dL — ABNORMAL LOW (ref 30.0–36.0)
MCHC: 29.5 g/dL — ABNORMAL LOW (ref 30.0–36.0)
MCHC: 30 g/dL (ref 30.0–36.0)
MCHC: 30.2 g/dL (ref 30.0–36.0)
MCHC: 30.5 g/dL (ref 30.0–36.0)
MCHC: 30.7 g/dL (ref 30.0–36.0)
MCHC: 30.8 g/dL (ref 30.0–36.0)
MCHC: 31 g/dL (ref 30.0–36.0)
MCHC: 31.3 g/dL (ref 30.0–36.0)
MCHC: 31.3 g/dL (ref 30.0–36.0)
MCV: 83 fL (ref 78.0–100.0)
MCV: 83.2 fL (ref 78.0–100.0)
MCV: 83.4 fL (ref 78.0–100.0)
MCV: 83.8 fL (ref 78.0–100.0)
MCV: 83.9 fL (ref 78.0–100.0)
MCV: 84 fL (ref 78.0–100.0)
MCV: 84.1 fL (ref 78.0–100.0)
MCV: 84.2 fL (ref 78.0–100.0)
MCV: 84.5 fL (ref 78.0–100.0)
MCV: 84.7 fL (ref 78.0–100.0)
Platelets: 177 10*3/uL (ref 150–400)
Platelets: 182 10*3/uL (ref 150–400)
Platelets: 184 10*3/uL (ref 150–400)
Platelets: 189 10*3/uL (ref 150–400)
Platelets: 194 10*3/uL (ref 150–400)
Platelets: 196 10*3/uL (ref 150–400)
Platelets: 197 10*3/uL (ref 150–400)
Platelets: 199 10*3/uL (ref 150–400)
Platelets: 201 10*3/uL (ref 150–400)
Platelets: 214 10*3/uL (ref 150–400)
Platelets: 219 10*3/uL (ref 150–400)
Platelets: 222 10*3/uL (ref 150–400)
Platelets: 246 10*3/uL (ref 150–400)
Platelets: 255 10*3/uL (ref 150–400)
Platelets: 265 10*3/uL (ref 150–400)
RBC: 2.73 MIL/uL — ABNORMAL LOW (ref 4.22–5.81)
RBC: 3.1 MIL/uL — ABNORMAL LOW (ref 4.22–5.81)
RBC: 3.19 MIL/uL — ABNORMAL LOW (ref 4.22–5.81)
RBC: 3.36 MIL/uL — ABNORMAL LOW (ref 4.22–5.81)
RBC: 3.45 MIL/uL — ABNORMAL LOW (ref 4.22–5.81)
RBC: 3.49 MIL/uL — ABNORMAL LOW (ref 4.22–5.81)
RBC: 3.52 MIL/uL — ABNORMAL LOW (ref 4.22–5.81)
RBC: 3.55 MIL/uL — ABNORMAL LOW (ref 4.22–5.81)
RBC: 3.69 MIL/uL — ABNORMAL LOW (ref 4.22–5.81)
RDW: 16.3 % — ABNORMAL HIGH (ref 11.5–15.5)
RDW: 16.4 % — ABNORMAL HIGH (ref 11.5–15.5)
RDW: 16.4 % — ABNORMAL HIGH (ref 11.5–15.5)
RDW: 16.5 % — ABNORMAL HIGH (ref 11.5–15.5)
RDW: 16.6 % — ABNORMAL HIGH (ref 11.5–15.5)
RDW: 16.7 % — ABNORMAL HIGH (ref 11.5–15.5)
RDW: 16.9 % — ABNORMAL HIGH (ref 11.5–15.5)
RDW: 16.9 % — ABNORMAL HIGH (ref 11.5–15.5)
RDW: 17.2 % — ABNORMAL HIGH (ref 11.5–15.5)
RDW: 17.4 % — ABNORMAL HIGH (ref 11.5–15.5)
RDW: 17.4 % — ABNORMAL HIGH (ref 11.5–15.5)
WBC: 10.2 10*3/uL (ref 4.0–10.5)
WBC: 10.2 10*3/uL (ref 4.0–10.5)
WBC: 10.4 10*3/uL (ref 4.0–10.5)
WBC: 10.5 10*3/uL (ref 4.0–10.5)
WBC: 10.5 10*3/uL (ref 4.0–10.5)
WBC: 13 10*3/uL — ABNORMAL HIGH (ref 4.0–10.5)
WBC: 13.3 10*3/uL — ABNORMAL HIGH (ref 4.0–10.5)
WBC: 8.3 10*3/uL (ref 4.0–10.5)
WBC: 8.4 10*3/uL (ref 4.0–10.5)
WBC: 9.9 10*3/uL (ref 4.0–10.5)

## 2010-12-01 LAB — BASIC METABOLIC PANEL
BUN: 104 mg/dL — ABNORMAL HIGH (ref 6–23)
BUN: 106 mg/dL — ABNORMAL HIGH (ref 6–23)
BUN: 37 mg/dL — ABNORMAL HIGH (ref 6–23)
BUN: 41 mg/dL — ABNORMAL HIGH (ref 6–23)
BUN: 54 mg/dL — ABNORMAL HIGH (ref 6–23)
BUN: 75 mg/dL — ABNORMAL HIGH (ref 6–23)
BUN: 91 mg/dL — ABNORMAL HIGH (ref 6–23)
CO2: 31 mEq/L (ref 19–32)
CO2: 32 mEq/L (ref 19–32)
CO2: 33 mEq/L — ABNORMAL HIGH (ref 19–32)
CO2: 33 mEq/L — ABNORMAL HIGH (ref 19–32)
CO2: 33 mEq/L — ABNORMAL HIGH (ref 19–32)
CO2: 34 mEq/L — ABNORMAL HIGH (ref 19–32)
Calcium: 7.8 mg/dL — ABNORMAL LOW (ref 8.4–10.5)
Calcium: 7.8 mg/dL — ABNORMAL LOW (ref 8.4–10.5)
Calcium: 8.1 mg/dL — ABNORMAL LOW (ref 8.4–10.5)
Calcium: 8.1 mg/dL — ABNORMAL LOW (ref 8.4–10.5)
Calcium: 8.1 mg/dL — ABNORMAL LOW (ref 8.4–10.5)
Calcium: 8.6 mg/dL (ref 8.4–10.5)
Chloride: 103 mEq/L (ref 96–112)
Chloride: 103 mEq/L (ref 96–112)
Chloride: 104 mEq/L (ref 96–112)
Chloride: 107 mEq/L (ref 96–112)
Chloride: 107 mEq/L (ref 96–112)
Creatinine, Ser: 1.62 mg/dL — ABNORMAL HIGH (ref 0.4–1.5)
Creatinine, Ser: 1.67 mg/dL — ABNORMAL HIGH (ref 0.4–1.5)
Creatinine, Ser: 2.02 mg/dL — ABNORMAL HIGH (ref 0.4–1.5)
Creatinine, Ser: 2.36 mg/dL — ABNORMAL HIGH (ref 0.4–1.5)
Creatinine, Ser: 2.59 mg/dL — ABNORMAL HIGH (ref 0.4–1.5)
Creatinine, Ser: 3.16 mg/dL — ABNORMAL HIGH (ref 0.4–1.5)
Creatinine, Ser: 3.65 mg/dL — ABNORMAL HIGH (ref 0.4–1.5)
Creatinine, Ser: 4.55 mg/dL — ABNORMAL HIGH (ref 0.4–1.5)
GFR calc Af Amer: 15 mL/min — ABNORMAL LOW (ref >60)
GFR calc Af Amer: 22 mL/min — ABNORMAL LOW (ref >60)
GFR calc Af Amer: 29 mL/min — ABNORMAL LOW (ref >60)
GFR calc Af Amer: 39 mL/min — ABNORMAL LOW (ref >60)
GFR calc Af Amer: 59 mL/min — ABNORMAL LOW (ref >60)
GFR calc Af Amer: >60 mL/min (ref >60)
GFR calc non Af Amer: 18 mL/min — ABNORMAL LOW (ref >60)
GFR calc non Af Amer: 19 mL/min — ABNORMAL LOW (ref >60)
GFR calc non Af Amer: 31 mL/min — ABNORMAL LOW (ref >60)
GFR calc non Af Amer: 42 mL/min — ABNORMAL LOW (ref >60)
GFR calc non Af Amer: 49 mL/min — ABNORMAL LOW (ref >60)
GFR calc non Af Amer: >60 mL/min (ref >60)
Glucose, Bld: 144 mg/dL — ABNORMAL HIGH (ref 70–99)
Glucose, Bld: 162 mg/dL — ABNORMAL HIGH (ref 70–99)
Glucose, Bld: 171 mg/dL — ABNORMAL HIGH (ref 70–99)
Glucose, Bld: 185 mg/dL — ABNORMAL HIGH (ref 70–99)
Glucose, Bld: 193 mg/dL — ABNORMAL HIGH (ref 70–99)
Glucose, Bld: 194 mg/dL — ABNORMAL HIGH (ref 70–99)
Glucose, Bld: 213 mg/dL — ABNORMAL HIGH (ref 70–99)
Glucose, Bld: 243 mg/dL — ABNORMAL HIGH (ref 70–99)
Potassium: 3.3 mEq/L — ABNORMAL LOW (ref 3.5–5.1)
Potassium: 3.9 mEq/L (ref 3.5–5.1)
Potassium: 4.2 mEq/L (ref 3.5–5.1)
Potassium: 4.4 mEq/L (ref 3.5–5.1)
Potassium: 5.7 mEq/L — ABNORMAL HIGH (ref 3.5–5.1)
Sodium: 137 mEq/L (ref 135–145)
Sodium: 138 mEq/L (ref 135–145)
Sodium: 143 mEq/L (ref 135–145)
Sodium: 146 mEq/L — ABNORMAL HIGH (ref 135–145)

## 2010-12-01 LAB — URINE CULTURE: Colony Count: >=100000

## 2010-12-01 LAB — CROSSMATCH
ABO/RH(D): O NEG
Antibody Screen: NEGATIVE
Unit division: 0
Unit division: 0

## 2010-12-01 LAB — URINALYSIS, MICROSCOPIC ONLY
Bilirubin Urine: NEGATIVE
Nitrite: POSITIVE — AB
Specific Gravity, Urine: 1.013 (ref 1.005–1.030)
pH: 5.5 (ref 5.0–8.0)

## 2010-12-01 LAB — PROTIME-INR
INR: 1.56 — ABNORMAL HIGH (ref 0.00–1.49)
INR: 2.25 — ABNORMAL HIGH (ref 0.00–1.49)
INR: 2.28 — ABNORMAL HIGH (ref 0.00–1.49)
INR: 2.31 — ABNORMAL HIGH (ref 0.00–1.49)
INR: 2.7 — ABNORMAL HIGH (ref 0.00–1.49)
INR: 3.03 — ABNORMAL HIGH (ref 0.00–1.49)
INR: 3.2 — ABNORMAL HIGH (ref 0.00–1.49)
INR: 3.28 — ABNORMAL HIGH (ref 0.00–1.49)
Prothrombin Time: 21.1 seconds — ABNORMAL HIGH (ref 11.6–15.2)
Prothrombin Time: 23.8 seconds — ABNORMAL HIGH (ref 11.6–15.2)
Prothrombin Time: 25.5 seconds — ABNORMAL HIGH (ref 11.6–15.2)
Prothrombin Time: 28.8 seconds — ABNORMAL HIGH (ref 11.6–15.2)
Prothrombin Time: 32.8 seconds — ABNORMAL HIGH (ref 11.6–15.2)
Prothrombin Time: 33.4 seconds — ABNORMAL HIGH (ref 11.6–15.2)
Prothrombin Time: 34.7 seconds — ABNORMAL HIGH (ref 11.6–15.2)

## 2010-12-01 LAB — CK TOTAL AND CKMB (NOT AT ARMC): CK, MB: 2.7 ng/mL (ref 0.3–4.0)

## 2010-12-01 LAB — PHOSPHORUS: Phosphorus: 3 mg/dL (ref 2.3–4.6)

## 2010-12-01 LAB — BRAIN NATRIURETIC PEPTIDE: Pro B Natriuretic peptide (BNP): 165 pg/mL — ABNORMAL HIGH (ref 0.0–100.0)

## 2010-12-01 LAB — MRSA PCR SCREENING: MRSA by PCR: NEGATIVE

## 2010-12-01 LAB — MAGNESIUM: Magnesium: 2.5 mg/dL (ref 1.5–2.5)

## 2010-12-02 LAB — COMPREHENSIVE METABOLIC PANEL
ALT: 33 U/L (ref 0–53)
ALT: 40 U/L (ref 0–53)
ALT: 244 U/L — ABNORMAL HIGH (ref 0–53)
ALT: 59 U/L — ABNORMAL HIGH (ref 0–53)
ALT: 62 U/L — ABNORMAL HIGH (ref 0–53)
ALT: 81 U/L — ABNORMAL HIGH (ref 0–53)
AST: 36 U/L (ref 0–37)
AST: 40 U/L — ABNORMAL HIGH (ref 0–37)
AST: 40 U/L — ABNORMAL HIGH (ref 0–37)
AST: 41 U/L — ABNORMAL HIGH (ref 0–37)
AST: 432 U/L — ABNORMAL HIGH (ref 0–37)
AST: 55 U/L — ABNORMAL HIGH (ref 0–37)
AST: 69 U/L — ABNORMAL HIGH (ref 0–37)
Albumin: 1.7 g/dL — ABNORMAL LOW (ref 3.5–5.2)
Albumin: 1.7 g/dL — ABNORMAL LOW (ref 3.5–5.2)
Albumin: 1.9 g/dL — ABNORMAL LOW (ref 3.5–5.2)
Albumin: 1.9 g/dL — ABNORMAL LOW (ref 3.5–5.2)
Albumin: 2 g/dL — ABNORMAL LOW (ref 3.5–5.2)
Albumin: 2.1 g/dL — ABNORMAL LOW (ref 3.5–5.2)
Albumin: 2.3 g/dL — ABNORMAL LOW (ref 3.5–5.2)
Albumin: 2.3 g/dL — ABNORMAL LOW (ref 3.5–5.2)
Alkaline Phosphatase: 74 U/L (ref 39–117)
Alkaline Phosphatase: 84 U/L (ref 39–117)
Alkaline Phosphatase: 152 U/L — ABNORMAL HIGH (ref 39–117)
Alkaline Phosphatase: 158 U/L — ABNORMAL HIGH (ref 39–117)
BUN: 125 mg/dL — ABNORMAL HIGH (ref 6–23)
BUN: 130 mg/dL — ABNORMAL HIGH (ref 6–23)
BUN: 143 mg/dL — ABNORMAL HIGH (ref 6–23)
BUN: 68 mg/dL — ABNORMAL HIGH (ref 6–23)
BUN: 78 mg/dL — ABNORMAL HIGH (ref 6–23)
BUN: 79 mg/dL — ABNORMAL HIGH (ref 6–23)
CO2: 28 mEq/L (ref 19–32)
CO2: 25 mEq/L (ref 19–32)
CO2: 27 mEq/L (ref 19–32)
CO2: 28 mEq/L (ref 19–32)
CO2: 34 mEq/L — ABNORMAL HIGH (ref 19–32)
CO2: 35 mEq/L — ABNORMAL HIGH (ref 19–32)
Calcium: 8 mg/dL — ABNORMAL LOW (ref 8.4–10.5)
Calcium: 8.1 mg/dL — ABNORMAL LOW (ref 8.4–10.5)
Calcium: 8.2 mg/dL — ABNORMAL LOW (ref 8.4–10.5)
Calcium: 8.3 mg/dL — ABNORMAL LOW (ref 8.4–10.5)
Calcium: 8.4 mg/dL (ref 8.4–10.5)
Calcium: 8.7 mg/dL (ref 8.4–10.5)
Chloride: 110 mEq/L (ref 96–112)
Chloride: 101 mEq/L (ref 96–112)
Chloride: 106 mEq/L (ref 96–112)
Chloride: 108 mEq/L (ref 96–112)
Chloride: 110 mEq/L (ref 96–112)
Chloride: 111 mEq/L (ref 96–112)
Chloride: 112 mEq/L (ref 96–112)
Chloride: 96 mEq/L (ref 96–112)
Creatinine, Ser: 1.47 mg/dL (ref 0.4–1.5)
Creatinine, Ser: 1.55 mg/dL — ABNORMAL HIGH (ref 0.4–1.5)
Creatinine, Ser: 1.75 mg/dL — ABNORMAL HIGH (ref 0.4–1.5)
Creatinine, Ser: 5.01 mg/dL — ABNORMAL HIGH (ref 0.4–1.5)
Creatinine, Ser: 5.16 mg/dL — ABNORMAL HIGH (ref 0.4–1.5)
GFR calc Af Amer: 13 mL/min — ABNORMAL LOW (ref >60)
GFR calc Af Amer: 14 mL/min — ABNORMAL LOW (ref >60)
GFR calc Af Amer: 32 mL/min — ABNORMAL LOW (ref >60)
GFR calc Af Amer: 53 mL/min — ABNORMAL LOW (ref >60)
GFR calc Af Amer: 55 mL/min — ABNORMAL LOW (ref >60)
GFR calc Af Amer: 55 mL/min — ABNORMAL LOW (ref >60)
GFR calc Af Amer: 57 mL/min — ABNORMAL LOW (ref >60)
GFR calc non Af Amer: 11 mL/min — ABNORMAL LOW (ref >60)
GFR calc non Af Amer: 38 mL/min — ABNORMAL LOW (ref >60)
GFR calc non Af Amer: 44 mL/min — ABNORMAL LOW (ref >60)
GFR calc non Af Amer: 46 mL/min — ABNORMAL LOW (ref >60)
GFR calc non Af Amer: 47 mL/min — ABNORMAL LOW (ref >60)
Glucose, Bld: 167 mg/dL — ABNORMAL HIGH (ref 70–99)
Glucose, Bld: 217 mg/dL — ABNORMAL HIGH (ref 70–99)
Glucose, Bld: 238 mg/dL — ABNORMAL HIGH (ref 70–99)
Glucose, Bld: 253 mg/dL — ABNORMAL HIGH (ref 70–99)
Glucose, Bld: 296 mg/dL — ABNORMAL HIGH (ref 70–99)
Potassium: 3.4 mEq/L — ABNORMAL LOW (ref 3.5–5.1)
Potassium: 4.2 mEq/L (ref 3.5–5.1)
Potassium: 3.1 mEq/L — ABNORMAL LOW (ref 3.5–5.1)
Potassium: 3.6 mEq/L (ref 3.5–5.1)
Potassium: 3.6 mEq/L (ref 3.5–5.1)
Potassium: 3.8 mEq/L (ref 3.5–5.1)
Sodium: 143 mEq/L (ref 135–145)
Sodium: 147 mEq/L — ABNORMAL HIGH (ref 135–145)
Sodium: 141 mEq/L (ref 135–145)
Sodium: 146 mEq/L — ABNORMAL HIGH (ref 135–145)
Sodium: 148 mEq/L — ABNORMAL HIGH (ref 135–145)
Total Bilirubin: 1.2 mg/dL (ref 0.3–1.2)
Total Bilirubin: 1.3 mg/dL — ABNORMAL HIGH (ref 0.3–1.2)
Total Bilirubin: 1.8 mg/dL — ABNORMAL HIGH (ref 0.3–1.2)
Total Bilirubin: 2.7 mg/dL — ABNORMAL HIGH (ref 0.3–1.2)
Total Bilirubin: 3.7 mg/dL — ABNORMAL HIGH (ref 0.3–1.2)
Total Protein: 7 g/dL (ref 6.0–8.3)
Total Protein: 7.5 g/dL (ref 6.0–8.3)
Total Protein: 6.5 g/dL (ref 6.0–8.3)
Total Protein: 6.6 g/dL (ref 6.0–8.3)
Total Protein: 6.8 g/dL (ref 6.0–8.3)

## 2010-12-02 LAB — POCT I-STAT 3, ART BLOOD GAS (G3+)
Acid-Base Excess: 2 mmol/L (ref 0.0–2.0)
Acid-Base Excess: 2 mmol/L (ref 0.0–2.0)
Acid-Base Excess: 3 mmol/L — ABNORMAL HIGH (ref 0.0–2.0)
Acid-Base Excess: 3 mmol/L — ABNORMAL HIGH (ref 0.0–2.0)
Acid-Base Excess: 5 mmol/L — ABNORMAL HIGH (ref 0.0–2.0)
Bicarbonate: 26.4 mEq/L — ABNORMAL HIGH (ref 20.0–24.0)
Bicarbonate: 26.5 mEq/L — ABNORMAL HIGH (ref 20.0–24.0)
Bicarbonate: 27.1 mEq/L — ABNORMAL HIGH (ref 20.0–24.0)
Bicarbonate: 26.2 mEq/L — ABNORMAL HIGH (ref 20.0–24.0)
Bicarbonate: 26.4 mEq/L — ABNORMAL HIGH (ref 20.0–24.0)
Bicarbonate: 28.6 mEq/L — ABNORMAL HIGH (ref 20.0–24.0)
Bicarbonate: 28.8 mEq/L — ABNORMAL HIGH (ref 20.0–24.0)
Bicarbonate: 29.6 mEq/L — ABNORMAL HIGH (ref 20.0–24.0)
Bicarbonate: 36.6 mEq/L — ABNORMAL HIGH (ref 20.0–24.0)
O2 Saturation: 96 %
O2 Saturation: 89 %
O2 Saturation: 98 %
Patient temperature: 98.2
Patient temperature: 98.4
Patient temperature: 99.2
Patient temperature: 99.3
TCO2: 28 mmol/L (ref 0–100)
TCO2: 28 mmol/L (ref 0–100)
TCO2: 28 mmol/L (ref 0–100)
TCO2: 28 mmol/L (ref 0–100)
TCO2: 30 mmol/L (ref 0–100)
TCO2: 38 mmol/L (ref 0–100)
pCO2 arterial: 40.2 mmHg (ref 35.0–45.0)
pCO2 arterial: 42 mmHg (ref 35.0–45.0)
pCO2 arterial: 42 mmHg (ref 35.0–45.0)
pCO2 arterial: 42.6 mmHg (ref 35.0–45.0)
pCO2 arterial: 44.1 mmHg (ref 35.0–45.0)
pCO2 arterial: 45.2 mmHg — ABNORMAL HIGH (ref 35.0–45.0)
pH, Arterial: 7.385 (ref 7.350–7.450)
pH, Arterial: 7.41 (ref 7.350–7.450)
pH, Arterial: 7.378 (ref 7.350–7.450)
pH, Arterial: 7.383 (ref 7.350–7.450)
pH, Arterial: 7.455 — ABNORMAL HIGH (ref 7.350–7.450)
pH, Arterial: 7.485 — ABNORMAL HIGH (ref 7.350–7.450)
pO2, Arterial: 114 mmHg — ABNORMAL HIGH (ref 80.0–100.0)
pO2, Arterial: 80 mmHg (ref 80.0–100.0)
pO2, Arterial: 87 mmHg (ref 80.0–100.0)
pO2, Arterial: 88 mmHg (ref 80.0–100.0)
pO2, Arterial: 107 mmHg — ABNORMAL HIGH (ref 80.0–100.0)
pO2, Arterial: 55 mmHg — ABNORMAL LOW (ref 80.0–100.0)
pO2, Arterial: 69 mmHg — ABNORMAL LOW (ref 80.0–100.0)
pO2, Arterial: 83 mmHg (ref 80.0–100.0)

## 2010-12-02 LAB — DIFFERENTIAL
Basophils Absolute: 0 10*3/uL (ref 0.0–0.1)
Basophils Absolute: 0 10*3/uL (ref 0.0–0.1)
Basophils Absolute: 0.1 10*3/uL (ref 0.0–0.1)
Basophils Absolute: 0.1 10*3/uL (ref 0.0–0.1)
Basophils Absolute: 0.1 10*3/uL (ref 0.0–0.1)
Basophils Absolute: 0.1 10*3/uL (ref 0.0–0.1)
Basophils Absolute: 0.1 10*3/uL (ref 0.0–0.1)
Basophils Absolute: 0 10*3/uL (ref 0.0–0.1)
Basophils Absolute: 0 10*3/uL (ref 0.0–0.1)
Basophils Absolute: 0 10*3/uL (ref 0.0–0.1)
Basophils Absolute: 0.1 10*3/uL (ref 0.0–0.1)
Basophils Absolute: 0.3 10*3/uL — ABNORMAL HIGH (ref 0.0–0.1)
Basophils Absolute: 0.3 10*3/uL — ABNORMAL HIGH (ref 0.0–0.1)
Basophils Relative: 0 % (ref 0–1)
Basophils Relative: 0 % (ref 0–1)
Basophils Relative: 1 % (ref 0–1)
Basophils Relative: 1 % (ref 0–1)
Basophils Relative: 1 % (ref 0–1)
Basophils Relative: 1 % (ref 0–1)
Basophils Relative: 1 % (ref 0–1)
Basophils Relative: 1 % (ref 0–1)
Basophils Relative: 1 % (ref 0–1)
Basophils Relative: 1 % (ref 0–1)
Eosinophils Absolute: 0.3 10*3/uL (ref 0.0–0.7)
Eosinophils Absolute: 0.3 10*3/uL (ref 0.0–0.7)
Eosinophils Absolute: 0.3 10*3/uL (ref 0.0–0.7)
Eosinophils Absolute: 0.4 10*3/uL (ref 0.0–0.7)
Eosinophils Absolute: 0.4 10*3/uL (ref 0.0–0.7)
Eosinophils Absolute: 0.4 10*3/uL (ref 0.0–0.7)
Eosinophils Absolute: 0.4 10*3/uL (ref 0.0–0.7)
Eosinophils Absolute: 0.5 10*3/uL (ref 0.0–0.7)
Eosinophils Absolute: 0.6 10*3/uL (ref 0.0–0.7)
Eosinophils Absolute: 0.9 10*3/uL — ABNORMAL HIGH (ref 0.0–0.7)
Eosinophils Absolute: 0.9 10*3/uL — ABNORMAL HIGH (ref 0.0–0.7)
Eosinophils Absolute: 0 10*3/uL (ref 0.0–0.7)
Eosinophils Absolute: 0 10*3/uL (ref 0.0–0.7)
Eosinophils Absolute: 0 10*3/uL (ref 0.0–0.7)
Eosinophils Absolute: 0.4 10*3/uL (ref 0.0–0.7)
Eosinophils Relative: 10 % — ABNORMAL HIGH (ref 0–5)
Eosinophils Relative: 3 % (ref 0–5)
Eosinophils Relative: 3 % (ref 0–5)
Eosinophils Relative: 3 % (ref 0–5)
Eosinophils Relative: 3 % (ref 0–5)
Eosinophils Relative: 4 % (ref 0–5)
Eosinophils Relative: 1 % (ref 0–5)
Eosinophils Relative: 3 % (ref 0–5)
Lymphocytes Relative: 25 % (ref 12–46)
Lymphocytes Relative: 25 % (ref 12–46)
Lymphocytes Relative: 26 % (ref 12–46)
Lymphocytes Relative: 11 % — ABNORMAL LOW (ref 12–46)
Lymphocytes Relative: 13 % (ref 12–46)
Lymphocytes Relative: 13 % (ref 12–46)
Lymphocytes Relative: 18 % (ref 12–46)
Lymphocytes Relative: 23 % (ref 12–46)
Lymphocytes Relative: 6 % — ABNORMAL LOW (ref 12–46)
Lymphs Abs: 1.6 10*3/uL (ref 0.7–4.0)
Lymphs Abs: 1.7 10*3/uL (ref 0.7–4.0)
Lymphs Abs: 1.9 10*3/uL (ref 0.7–4.0)
Lymphs Abs: 2.5 10*3/uL (ref 0.7–4.0)
Lymphs Abs: 2.6 10*3/uL (ref 0.7–4.0)
Lymphs Abs: 2.8 10*3/uL (ref 0.7–4.0)
Lymphs Abs: 2.8 10*3/uL (ref 0.7–4.0)
Lymphs Abs: 2.1 10*3/uL (ref 0.7–4.0)
Lymphs Abs: 2.3 10*3/uL (ref 0.7–4.0)
Lymphs Abs: 2.4 10*3/uL (ref 0.7–4.0)
Lymphs Abs: 3.1 10*3/uL (ref 0.7–4.0)
Lymphs Abs: 3.5 10*3/uL (ref 0.7–4.0)
Lymphs Abs: 6.6 10*3/uL — ABNORMAL HIGH (ref 0.7–4.0)
Monocytes Absolute: 0.5 10*3/uL (ref 0.1–1.0)
Monocytes Absolute: 0.7 10*3/uL (ref 0.1–1.0)
Monocytes Absolute: 0.7 10*3/uL (ref 0.1–1.0)
Monocytes Absolute: 0.7 10*3/uL (ref 0.1–1.0)
Monocytes Absolute: 0.8 10*3/uL (ref 0.1–1.0)
Monocytes Absolute: 0.9 10*3/uL (ref 0.1–1.0)
Monocytes Absolute: 1 10*3/uL (ref 0.1–1.0)
Monocytes Absolute: 1 10*3/uL (ref 0.1–1.0)
Monocytes Absolute: 0.7 10*3/uL (ref 0.1–1.0)
Monocytes Absolute: 0.9 10*3/uL (ref 0.1–1.0)
Monocytes Absolute: 0.9 10*3/uL (ref 0.1–1.0)
Monocytes Absolute: 1 10*3/uL (ref 0.1–1.0)
Monocytes Absolute: 1.1 10*3/uL — ABNORMAL HIGH (ref 0.1–1.0)
Monocytes Absolute: 2.3 10*3/uL — ABNORMAL HIGH (ref 0.1–1.0)
Monocytes Relative: 10 % (ref 3–12)
Monocytes Relative: 8 % (ref 3–12)
Monocytes Relative: 8 % (ref 3–12)
Monocytes Relative: 8 % (ref 3–12)
Monocytes Relative: 9 % (ref 3–12)
Monocytes Relative: 9 % (ref 3–12)
Monocytes Relative: 9 % (ref 3–12)
Monocytes Relative: 5 % (ref 3–12)
Monocytes Relative: 5 % (ref 3–12)
Monocytes Relative: 5 % (ref 3–12)
Monocytes Relative: 7 % (ref 3–12)
Neutro Abs: 3.7 10*3/uL (ref 1.7–7.7)
Neutro Abs: 3.9 10*3/uL (ref 1.7–7.7)
Neutro Abs: 4.5 10*3/uL (ref 1.7–7.7)
Neutro Abs: 4.5 10*3/uL (ref 1.7–7.7)
Neutro Abs: 4.7 10*3/uL (ref 1.7–7.7)
Neutro Abs: 5.4 10*3/uL (ref 1.7–7.7)
Neutro Abs: 6.5 10*3/uL (ref 1.7–7.7)
Neutro Abs: 9.1 10*3/uL — ABNORMAL HIGH (ref 1.7–7.7)
Neutro Abs: 12.6 10*3/uL — ABNORMAL HIGH (ref 1.7–7.7)
Neutro Abs: 15.8 10*3/uL — ABNORMAL HIGH (ref 1.7–7.7)
Neutro Abs: 16.4 10*3/uL — ABNORMAL HIGH (ref 1.7–7.7)
Neutro Abs: 17.5 10*3/uL — ABNORMAL HIGH (ref 1.7–7.7)
Neutro Abs: 18.6 10*3/uL — ABNORMAL HIGH (ref 1.7–7.7)
Neutro Abs: 8.2 10*3/uL — ABNORMAL HIGH (ref 1.7–7.7)
Neutrophils Relative %: 53 % (ref 43–77)
Neutrophils Relative %: 54 % (ref 43–77)
Neutrophils Relative %: 55 % (ref 43–77)
Neutrophils Relative %: 56 % (ref 43–77)
Neutrophils Relative %: 60 % (ref 43–77)
Neutrophils Relative %: 64 % (ref 43–77)
Neutrophils Relative %: 64 % (ref 43–77)
Neutrophils Relative %: 67 % (ref 43–77)
Neutrophils Relative %: 67 % (ref 43–77)
Neutrophils Relative %: 72 % (ref 43–77)
Neutrophils Relative %: 77 % (ref 43–77)
Neutrophils Relative %: 80 % — ABNORMAL HIGH (ref 43–77)
Neutrophils Relative %: 82 % — ABNORMAL HIGH (ref 43–77)
Neutrophils Relative %: 82 % — ABNORMAL HIGH (ref 43–77)
Neutrophils Relative %: 84 % — ABNORMAL HIGH (ref 43–77)
Smear Review: ADEQUATE

## 2010-12-02 LAB — BLOOD GAS, ARTERIAL
Acid-base deficit: 3.1 mmol/L — ABNORMAL HIGH (ref 0.0–2.0)
Bicarbonate: 22.3 mEq/L (ref 20.0–24.0)
Bicarbonate: 22.5 mEq/L (ref 20.0–24.0)
FIO2: 0.4 %
MECHVT: 600 mL
O2 Saturation: 94.3 %
PEEP: 5 cmH2O
Patient temperature: 97.6
RATE: 14 resp/min
TCO2: 23.6 mmol/L (ref 0–100)
TCO2: 23.8 mmol/L (ref 0–100)
pH, Arterial: 7.264 — ABNORMAL LOW (ref 7.350–7.450)
pH, Arterial: 7.394 (ref 7.350–7.450)
pO2, Arterial: 79 mmHg — ABNORMAL LOW (ref 80.0–100.0)
pO2, Arterial: 81.9 mmHg (ref 80.0–100.0)

## 2010-12-02 LAB — CULTURE, BLOOD (ROUTINE X 2)
Culture  Setup Time: 201109211352
Culture  Setup Time: 201109252138
Culture: NO GROWTH
Culture: NO GROWTH

## 2010-12-02 LAB — GLUCOSE, CAPILLARY
Glucose-Capillary: 103 mg/dL — ABNORMAL HIGH (ref 70–99)
Glucose-Capillary: 107 mg/dL — ABNORMAL HIGH (ref 70–99)
Glucose-Capillary: 117 mg/dL — ABNORMAL HIGH (ref 70–99)
Glucose-Capillary: 124 mg/dL — ABNORMAL HIGH (ref 70–99)
Glucose-Capillary: 127 mg/dL — ABNORMAL HIGH (ref 70–99)
Glucose-Capillary: 132 mg/dL — ABNORMAL HIGH (ref 70–99)
Glucose-Capillary: 133 mg/dL — ABNORMAL HIGH (ref 70–99)
Glucose-Capillary: 134 mg/dL — ABNORMAL HIGH (ref 70–99)
Glucose-Capillary: 135 mg/dL — ABNORMAL HIGH (ref 70–99)
Glucose-Capillary: 139 mg/dL — ABNORMAL HIGH (ref 70–99)
Glucose-Capillary: 147 mg/dL — ABNORMAL HIGH (ref 70–99)
Glucose-Capillary: 149 mg/dL — ABNORMAL HIGH (ref 70–99)
Glucose-Capillary: 151 mg/dL — ABNORMAL HIGH (ref 70–99)
Glucose-Capillary: 152 mg/dL — ABNORMAL HIGH (ref 70–99)
Glucose-Capillary: 153 mg/dL — ABNORMAL HIGH (ref 70–99)
Glucose-Capillary: 154 mg/dL — ABNORMAL HIGH (ref 70–99)
Glucose-Capillary: 154 mg/dL — ABNORMAL HIGH (ref 70–99)
Glucose-Capillary: 155 mg/dL — ABNORMAL HIGH (ref 70–99)
Glucose-Capillary: 155 mg/dL — ABNORMAL HIGH (ref 70–99)
Glucose-Capillary: 157 mg/dL — ABNORMAL HIGH (ref 70–99)
Glucose-Capillary: 157 mg/dL — ABNORMAL HIGH (ref 70–99)
Glucose-Capillary: 158 mg/dL — ABNORMAL HIGH (ref 70–99)
Glucose-Capillary: 159 mg/dL — ABNORMAL HIGH (ref 70–99)
Glucose-Capillary: 162 mg/dL — ABNORMAL HIGH (ref 70–99)
Glucose-Capillary: 163 mg/dL — ABNORMAL HIGH (ref 70–99)
Glucose-Capillary: 166 mg/dL — ABNORMAL HIGH (ref 70–99)
Glucose-Capillary: 166 mg/dL — ABNORMAL HIGH (ref 70–99)
Glucose-Capillary: 167 mg/dL — ABNORMAL HIGH (ref 70–99)
Glucose-Capillary: 169 mg/dL — ABNORMAL HIGH (ref 70–99)
Glucose-Capillary: 169 mg/dL — ABNORMAL HIGH (ref 70–99)
Glucose-Capillary: 169 mg/dL — ABNORMAL HIGH (ref 70–99)
Glucose-Capillary: 171 mg/dL — ABNORMAL HIGH (ref 70–99)
Glucose-Capillary: 173 mg/dL — ABNORMAL HIGH (ref 70–99)
Glucose-Capillary: 174 mg/dL — ABNORMAL HIGH (ref 70–99)
Glucose-Capillary: 175 mg/dL — ABNORMAL HIGH (ref 70–99)
Glucose-Capillary: 178 mg/dL — ABNORMAL HIGH (ref 70–99)
Glucose-Capillary: 180 mg/dL — ABNORMAL HIGH (ref 70–99)
Glucose-Capillary: 180 mg/dL — ABNORMAL HIGH (ref 70–99)
Glucose-Capillary: 183 mg/dL — ABNORMAL HIGH (ref 70–99)
Glucose-Capillary: 183 mg/dL — ABNORMAL HIGH (ref 70–99)
Glucose-Capillary: 186 mg/dL — ABNORMAL HIGH (ref 70–99)
Glucose-Capillary: 188 mg/dL — ABNORMAL HIGH (ref 70–99)
Glucose-Capillary: 191 mg/dL — ABNORMAL HIGH (ref 70–99)
Glucose-Capillary: 191 mg/dL — ABNORMAL HIGH (ref 70–99)
Glucose-Capillary: 193 mg/dL — ABNORMAL HIGH (ref 70–99)
Glucose-Capillary: 193 mg/dL — ABNORMAL HIGH (ref 70–99)
Glucose-Capillary: 209 mg/dL — ABNORMAL HIGH (ref 70–99)
Glucose-Capillary: 65 mg/dL — ABNORMAL LOW (ref 70–99)
Glucose-Capillary: 106 mg/dL — ABNORMAL HIGH (ref 70–99)
Glucose-Capillary: 108 mg/dL — ABNORMAL HIGH (ref 70–99)
Glucose-Capillary: 114 mg/dL — ABNORMAL HIGH (ref 70–99)
Glucose-Capillary: 125 mg/dL — ABNORMAL HIGH (ref 70–99)
Glucose-Capillary: 130 mg/dL — ABNORMAL HIGH (ref 70–99)
Glucose-Capillary: 139 mg/dL — ABNORMAL HIGH (ref 70–99)
Glucose-Capillary: 141 mg/dL — ABNORMAL HIGH (ref 70–99)
Glucose-Capillary: 144 mg/dL — ABNORMAL HIGH (ref 70–99)
Glucose-Capillary: 145 mg/dL — ABNORMAL HIGH (ref 70–99)
Glucose-Capillary: 153 mg/dL — ABNORMAL HIGH (ref 70–99)
Glucose-Capillary: 154 mg/dL — ABNORMAL HIGH (ref 70–99)
Glucose-Capillary: 161 mg/dL — ABNORMAL HIGH (ref 70–99)
Glucose-Capillary: 161 mg/dL — ABNORMAL HIGH (ref 70–99)
Glucose-Capillary: 161 mg/dL — ABNORMAL HIGH (ref 70–99)
Glucose-Capillary: 164 mg/dL — ABNORMAL HIGH (ref 70–99)
Glucose-Capillary: 166 mg/dL — ABNORMAL HIGH (ref 70–99)
Glucose-Capillary: 168 mg/dL — ABNORMAL HIGH (ref 70–99)
Glucose-Capillary: 170 mg/dL — ABNORMAL HIGH (ref 70–99)
Glucose-Capillary: 171 mg/dL — ABNORMAL HIGH (ref 70–99)
Glucose-Capillary: 177 mg/dL — ABNORMAL HIGH (ref 70–99)
Glucose-Capillary: 184 mg/dL — ABNORMAL HIGH (ref 70–99)
Glucose-Capillary: 188 mg/dL — ABNORMAL HIGH (ref 70–99)
Glucose-Capillary: 194 mg/dL — ABNORMAL HIGH (ref 70–99)
Glucose-Capillary: 195 mg/dL — ABNORMAL HIGH (ref 70–99)
Glucose-Capillary: 195 mg/dL — ABNORMAL HIGH (ref 70–99)
Glucose-Capillary: 195 mg/dL — ABNORMAL HIGH (ref 70–99)
Glucose-Capillary: 195 mg/dL — ABNORMAL HIGH (ref 70–99)
Glucose-Capillary: 203 mg/dL — ABNORMAL HIGH (ref 70–99)
Glucose-Capillary: 212 mg/dL — ABNORMAL HIGH (ref 70–99)
Glucose-Capillary: 214 mg/dL — ABNORMAL HIGH (ref 70–99)
Glucose-Capillary: 216 mg/dL — ABNORMAL HIGH (ref 70–99)
Glucose-Capillary: 237 mg/dL — ABNORMAL HIGH (ref 70–99)
Glucose-Capillary: 238 mg/dL — ABNORMAL HIGH (ref 70–99)
Glucose-Capillary: 248 mg/dL — ABNORMAL HIGH (ref 70–99)
Glucose-Capillary: 325 mg/dL — ABNORMAL HIGH (ref 70–99)

## 2010-12-02 LAB — BASIC METABOLIC PANEL
BUN: 31 mg/dL — ABNORMAL HIGH (ref 6–23)
BUN: 34 mg/dL — ABNORMAL HIGH (ref 6–23)
BUN: 37 mg/dL — ABNORMAL HIGH (ref 6–23)
BUN: 40 mg/dL — ABNORMAL HIGH (ref 6–23)
BUN: 42 mg/dL — ABNORMAL HIGH (ref 6–23)
BUN: 126 mg/dL — ABNORMAL HIGH (ref 6–23)
BUN: 128 mg/dL — ABNORMAL HIGH (ref 6–23)
BUN: 140 mg/dL — ABNORMAL HIGH (ref 6–23)
BUN: 150 mg/dL — ABNORMAL HIGH (ref 6–23)
BUN: 76 mg/dL — ABNORMAL HIGH (ref 6–23)
BUN: 77 mg/dL — ABNORMAL HIGH (ref 6–23)
BUN: 81 mg/dL — ABNORMAL HIGH (ref 6–23)
BUN: 96 mg/dL — ABNORMAL HIGH (ref 6–23)
CO2: 27 mEq/L (ref 19–32)
CO2: 28 mEq/L (ref 19–32)
CO2: 28 mEq/L (ref 19–32)
CO2: 29 mEq/L (ref 19–32)
CO2: 30 mEq/L (ref 19–32)
CO2: 33 mEq/L — ABNORMAL HIGH (ref 19–32)
CO2: 24 mEq/L (ref 19–32)
CO2: 26 mEq/L (ref 19–32)
CO2: 27 mEq/L (ref 19–32)
CO2: 27 mEq/L (ref 19–32)
CO2: 28 mEq/L (ref 19–32)
CO2: 30 mEq/L (ref 19–32)
CO2: 31 mEq/L (ref 19–32)
Calcium: 8.1 mg/dL — ABNORMAL LOW (ref 8.4–10.5)
Calcium: 8.3 mg/dL — ABNORMAL LOW (ref 8.4–10.5)
Calcium: 8.3 mg/dL — ABNORMAL LOW (ref 8.4–10.5)
Calcium: 8.3 mg/dL — ABNORMAL LOW (ref 8.4–10.5)
Calcium: 8.3 mg/dL — ABNORMAL LOW (ref 8.4–10.5)
Calcium: 8.5 mg/dL (ref 8.4–10.5)
Calcium: 8.5 mg/dL (ref 8.4–10.5)
Calcium: 8.1 mg/dL — ABNORMAL LOW (ref 8.4–10.5)
Calcium: 8.5 mg/dL (ref 8.4–10.5)
Calcium: 8.6 mg/dL (ref 8.4–10.5)
Calcium: 8.6 mg/dL (ref 8.4–10.5)
Calcium: 8.6 mg/dL (ref 8.4–10.5)
Calcium: 8.8 mg/dL (ref 8.4–10.5)
Chloride: 104 mEq/L (ref 96–112)
Chloride: 112 mEq/L (ref 96–112)
Chloride: 112 mEq/L (ref 96–112)
Chloride: 114 mEq/L — ABNORMAL HIGH (ref 96–112)
Chloride: 100 mEq/L (ref 96–112)
Chloride: 105 mEq/L (ref 96–112)
Chloride: 108 mEq/L (ref 96–112)
Chloride: 114 mEq/L — ABNORMAL HIGH (ref 96–112)
Chloride: 97 mEq/L (ref 96–112)
Chloride: 98 mEq/L (ref 96–112)
Chloride: 99 mEq/L (ref 96–112)
Creatinine, Ser: 1.26 mg/dL (ref 0.4–1.5)
Creatinine, Ser: 1.27 mg/dL (ref 0.4–1.5)
Creatinine, Ser: 1.29 mg/dL (ref 0.4–1.5)
Creatinine, Ser: 1.34 mg/dL (ref 0.4–1.5)
Creatinine, Ser: 1.36 mg/dL (ref 0.4–1.5)
Creatinine, Ser: 1.41 mg/dL (ref 0.4–1.5)
Creatinine, Ser: 1.45 mg/dL (ref 0.4–1.5)
Creatinine, Ser: 1.52 mg/dL — ABNORMAL HIGH (ref 0.4–1.5)
Creatinine, Ser: 1.53 mg/dL — ABNORMAL HIGH (ref 0.4–1.5)
Creatinine, Ser: 1.73 mg/dL — ABNORMAL HIGH (ref 0.4–1.5)
Creatinine, Ser: 1.47 mg/dL (ref 0.4–1.5)
Creatinine, Ser: 2.67 mg/dL — ABNORMAL HIGH (ref 0.4–1.5)
Creatinine, Ser: 4.9 mg/dL — ABNORMAL HIGH (ref 0.4–1.5)
Creatinine, Ser: 5.29 mg/dL — ABNORMAL HIGH (ref 0.4–1.5)
GFR calc Af Amer: 54 mL/min — ABNORMAL LOW (ref >60)
GFR calc Af Amer: 57 mL/min — ABNORMAL LOW (ref >60)
GFR calc Af Amer: 59 mL/min — ABNORMAL LOW (ref >60)
GFR calc Af Amer: >60 mL/min (ref >60)
GFR calc Af Amer: >60 mL/min (ref >60)
GFR calc Af Amer: 13 mL/min — ABNORMAL LOW (ref >60)
GFR calc Af Amer: 15 mL/min — ABNORMAL LOW (ref >60)
GFR calc Af Amer: 39 mL/min — ABNORMAL LOW (ref >60)
GFR calc Af Amer: 57 mL/min — ABNORMAL LOW (ref >60)
GFR calc Af Amer: >60 mL/min (ref >60)
GFR calc non Af Amer: 39 mL/min — ABNORMAL LOW (ref >60)
GFR calc non Af Amer: 45 mL/min — ABNORMAL LOW (ref >60)
GFR calc non Af Amer: 45 mL/min — ABNORMAL LOW (ref >60)
GFR calc non Af Amer: 49 mL/min — ABNORMAL LOW (ref >60)
GFR calc non Af Amer: 52 mL/min — ABNORMAL LOW (ref >60)
GFR calc non Af Amer: 55 mL/min — ABNORMAL LOW (ref >60)
GFR calc non Af Amer: 55 mL/min — ABNORMAL LOW (ref >60)
GFR calc non Af Amer: 56 mL/min — ABNORMAL LOW (ref >60)
GFR calc non Af Amer: 11 mL/min — ABNORMAL LOW (ref >60)
GFR calc non Af Amer: 11 mL/min — ABNORMAL LOW (ref >60)
GFR calc non Af Amer: 12 mL/min — ABNORMAL LOW (ref >60)
GFR calc non Af Amer: 32 mL/min — ABNORMAL LOW (ref >60)
GFR calc non Af Amer: 35 mL/min — ABNORMAL LOW (ref >60)
GFR calc non Af Amer: 39 mL/min — ABNORMAL LOW (ref >60)
GFR calc non Af Amer: 47 mL/min — ABNORMAL LOW (ref >60)
Glucose, Bld: 118 mg/dL — ABNORMAL HIGH (ref 70–99)
Glucose, Bld: 137 mg/dL — ABNORMAL HIGH (ref 70–99)
Glucose, Bld: 145 mg/dL — ABNORMAL HIGH (ref 70–99)
Glucose, Bld: 146 mg/dL — ABNORMAL HIGH (ref 70–99)
Glucose, Bld: 160 mg/dL — ABNORMAL HIGH (ref 70–99)
Glucose, Bld: 177 mg/dL — ABNORMAL HIGH (ref 70–99)
Glucose, Bld: 184 mg/dL — ABNORMAL HIGH (ref 70–99)
Glucose, Bld: 191 mg/dL — ABNORMAL HIGH (ref 70–99)
Glucose, Bld: 115 mg/dL — ABNORMAL HIGH (ref 70–99)
Glucose, Bld: 172 mg/dL — ABNORMAL HIGH (ref 70–99)
Glucose, Bld: 186 mg/dL — ABNORMAL HIGH (ref 70–99)
Glucose, Bld: 188 mg/dL — ABNORMAL HIGH (ref 70–99)
Glucose, Bld: 192 mg/dL — ABNORMAL HIGH (ref 70–99)
Glucose, Bld: 193 mg/dL — ABNORMAL HIGH (ref 70–99)
Glucose, Bld: 209 mg/dL — ABNORMAL HIGH (ref 70–99)
Glucose, Bld: 224 mg/dL — ABNORMAL HIGH (ref 70–99)
Glucose, Bld: 233 mg/dL — ABNORMAL HIGH (ref 70–99)
Glucose, Bld: 70 mg/dL (ref 70–99)
Potassium: 3.8 mEq/L (ref 3.5–5.1)
Potassium: 4 mEq/L (ref 3.5–5.1)
Potassium: 3.4 mEq/L — ABNORMAL LOW (ref 3.5–5.1)
Potassium: 3.4 mEq/L — ABNORMAL LOW (ref 3.5–5.1)
Potassium: 3.7 mEq/L (ref 3.5–5.1)
Potassium: 3.8 mEq/L (ref 3.5–5.1)
Potassium: 4.5 mEq/L (ref 3.5–5.1)
Potassium: 4.7 mEq/L (ref 3.5–5.1)
Potassium: 5.3 mEq/L — ABNORMAL HIGH (ref 3.5–5.1)
Potassium: 6.3 mEq/L (ref 3.5–5.1)
Sodium: 141 mEq/L (ref 135–145)
Sodium: 142 mEq/L (ref 135–145)
Sodium: 143 mEq/L (ref 135–145)
Sodium: 144 mEq/L (ref 135–145)
Sodium: 145 mEq/L (ref 135–145)
Sodium: 145 mEq/L (ref 135–145)
Sodium: 146 mEq/L — ABNORMAL HIGH (ref 135–145)
Sodium: 147 mEq/L — ABNORMAL HIGH (ref 135–145)
Sodium: 148 mEq/L — ABNORMAL HIGH (ref 135–145)
Sodium: 149 mEq/L — ABNORMAL HIGH (ref 135–145)
Sodium: 132 mEq/L — ABNORMAL LOW (ref 135–145)
Sodium: 135 mEq/L (ref 135–145)
Sodium: 139 mEq/L (ref 135–145)
Sodium: 140 mEq/L (ref 135–145)
Sodium: 141 mEq/L (ref 135–145)
Sodium: 144 mEq/L (ref 135–145)
Sodium: 145 mEq/L (ref 135–145)
Sodium: 149 mEq/L — ABNORMAL HIGH (ref 135–145)

## 2010-12-02 LAB — CBC
HCT: 24.8 % — ABNORMAL LOW (ref 39.0–52.0)
HCT: 26 % — ABNORMAL LOW (ref 39.0–52.0)
HCT: 27.5 % — ABNORMAL LOW (ref 39.0–52.0)
HCT: 29.3 % — ABNORMAL LOW (ref 39.0–52.0)
HCT: 28.6 % — ABNORMAL LOW (ref 39.0–52.0)
HCT: 29.5 % — ABNORMAL LOW (ref 39.0–52.0)
HCT: 30.4 % — ABNORMAL LOW (ref 39.0–52.0)
HCT: 31.9 % — ABNORMAL LOW (ref 39.0–52.0)
HCT: 32.4 % — ABNORMAL LOW (ref 39.0–52.0)
HCT: 32.9 % — ABNORMAL LOW (ref 39.0–52.0)
Hemoglobin: 7.5 g/dL — ABNORMAL LOW (ref 13.0–17.0)
Hemoglobin: 7.6 g/dL — ABNORMAL LOW (ref 13.0–17.0)
Hemoglobin: 7.7 g/dL — ABNORMAL LOW (ref 13.0–17.0)
Hemoglobin: 7.9 g/dL — ABNORMAL LOW (ref 13.0–17.0)
Hemoglobin: 7.9 g/dL — ABNORMAL LOW (ref 13.0–17.0)
Hemoglobin: 8.1 g/dL — ABNORMAL LOW (ref 13.0–17.0)
Hemoglobin: 8.6 g/dL — ABNORMAL LOW (ref 13.0–17.0)
Hemoglobin: 8.7 g/dL — ABNORMAL LOW (ref 13.0–17.0)
Hemoglobin: 8.9 g/dL — ABNORMAL LOW (ref 13.0–17.0)
Hemoglobin: 9.2 g/dL — ABNORMAL LOW (ref 13.0–17.0)
Hemoglobin: 10 g/dL — ABNORMAL LOW (ref 13.0–17.0)
Hemoglobin: 10 g/dL — ABNORMAL LOW (ref 13.0–17.0)
Hemoglobin: 10 g/dL — ABNORMAL LOW (ref 13.0–17.0)
Hemoglobin: 10.1 g/dL — ABNORMAL LOW (ref 13.0–17.0)
Hemoglobin: 6.1 g/dL — CL (ref 13.0–17.0)
Hemoglobin: 8.7 g/dL — ABNORMAL LOW (ref 13.0–17.0)
Hemoglobin: 9.1 g/dL — ABNORMAL LOW (ref 13.0–17.0)
Hemoglobin: 9.1 g/dL — ABNORMAL LOW (ref 13.0–17.0)
MCH: 25.2 pg — ABNORMAL LOW (ref 26.0–34.0)
MCH: 25.7 pg — ABNORMAL LOW (ref 26.0–34.0)
MCH: 25.9 pg — ABNORMAL LOW (ref 26.0–34.0)
MCH: 25.9 pg — ABNORMAL LOW (ref 26.0–34.0)
MCH: 26 pg (ref 26.0–34.0)
MCH: 26.3 pg (ref 26.0–34.0)
MCH: 26.4 pg (ref 26.0–34.0)
MCH: 26.8 pg (ref 26.0–34.0)
MCH: 26.9 pg (ref 26.0–34.0)
MCH: 27.1 pg (ref 26.0–34.0)
MCH: 26.5 pg (ref 26.0–34.0)
MCH: 26.5 pg (ref 26.0–34.0)
MCH: 27 pg (ref 26.0–34.0)
MCH: 27.1 pg (ref 26.0–34.0)
MCH: 27.2 pg (ref 26.0–34.0)
MCH: 27.3 pg (ref 26.0–34.0)
MCH: 27.5 pg (ref 26.0–34.0)
MCHC: 29.2 g/dL — ABNORMAL LOW (ref 30.0–36.0)
MCHC: 29.5 g/dL — ABNORMAL LOW (ref 30.0–36.0)
MCHC: 29.9 g/dL — ABNORMAL LOW (ref 30.0–36.0)
MCHC: 30.2 g/dL (ref 30.0–36.0)
MCHC: 30.3 g/dL (ref 30.0–36.0)
MCHC: 30.4 g/dL (ref 30.0–36.0)
MCHC: 30.5 g/dL (ref 30.0–36.0)
MCHC: 30.5 g/dL (ref 30.0–36.0)
MCHC: 30.3 g/dL (ref 30.0–36.0)
MCHC: 30.8 g/dL (ref 30.0–36.0)
MCHC: 30.8 g/dL (ref 30.0–36.0)
MCHC: 31.9 g/dL (ref 30.0–36.0)
MCHC: 32.2 g/dL (ref 30.0–36.0)
MCHC: 32.8 g/dL (ref 30.0–36.0)
MCHC: 32.9 g/dL (ref 30.0–36.0)
MCV: 85.3 fL (ref 78.0–100.0)
MCV: 85.7 fL (ref 78.0–100.0)
MCV: 86.5 fL (ref 78.0–100.0)
MCV: 89.3 fL (ref 78.0–100.0)
MCV: 81.5 fL (ref 78.0–100.0)
MCV: 83.5 fL (ref 78.0–100.0)
MCV: 83.9 fL (ref 78.0–100.0)
MCV: 87.3 fL (ref 78.0–100.0)
MCV: 87.3 fL (ref 78.0–100.0)
MCV: 88.3 fL (ref 78.0–100.0)
MCV: 88.8 fL (ref 78.0–100.0)
Platelets: 191 10*3/uL (ref 150–400)
Platelets: 201 10*3/uL (ref 150–400)
Platelets: 213 10*3/uL (ref 150–400)
Platelets: 224 10*3/uL (ref 150–400)
Platelets: 227 10*3/uL (ref 150–400)
Platelets: 231 10*3/uL (ref 150–400)
Platelets: 239 10*3/uL (ref 150–400)
Platelets: 244 10*3/uL (ref 150–400)
Platelets: 177 10*3/uL (ref 150–400)
Platelets: 60 10*3/uL — ABNORMAL LOW (ref 150–400)
Platelets: 62 10*3/uL — ABNORMAL LOW (ref 150–400)
RBC: 3.01 MIL/uL — ABNORMAL LOW (ref 4.22–5.81)
RBC: 3.05 MIL/uL — ABNORMAL LOW (ref 4.22–5.81)
RBC: 3.05 MIL/uL — ABNORMAL LOW (ref 4.22–5.81)
RBC: 3.2 MIL/uL — ABNORMAL LOW (ref 4.22–5.81)
RBC: 3.2 MIL/uL — ABNORMAL LOW (ref 4.22–5.81)
RBC: 3.28 MIL/uL — ABNORMAL LOW (ref 4.22–5.81)
RBC: 2.28 MIL/uL — ABNORMAL LOW (ref 4.22–5.81)
RBC: 3.17 MIL/uL — ABNORMAL LOW (ref 4.22–5.81)
RBC: 3.33 MIL/uL — ABNORMAL LOW (ref 4.22–5.81)
RBC: 3.34 MIL/uL — ABNORMAL LOW (ref 4.22–5.81)
RBC: 3.64 MIL/uL — ABNORMAL LOW (ref 4.22–5.81)
RBC: 3.71 MIL/uL — ABNORMAL LOW (ref 4.22–5.81)
RBC: 3.77 MIL/uL — ABNORMAL LOW (ref 4.22–5.81)
RBC: 3.8 MIL/uL — ABNORMAL LOW (ref 4.22–5.81)
RDW: 16.1 % — ABNORMAL HIGH (ref 11.5–15.5)
RDW: 16.3 % — ABNORMAL HIGH (ref 11.5–15.5)
RDW: 16.3 % — ABNORMAL HIGH (ref 11.5–15.5)
RDW: 16.5 % — ABNORMAL HIGH (ref 11.5–15.5)
RDW: 16.7 % — ABNORMAL HIGH (ref 11.5–15.5)
RDW: 16.9 % — ABNORMAL HIGH (ref 11.5–15.5)
RDW: 17 % — ABNORMAL HIGH (ref 11.5–15.5)
RDW: 18.2 % — ABNORMAL HIGH (ref 11.5–15.5)
RDW: 17.7 % — ABNORMAL HIGH (ref 11.5–15.5)
RDW: 17.8 % — ABNORMAL HIGH (ref 11.5–15.5)
RDW: 17.8 % — ABNORMAL HIGH (ref 11.5–15.5)
RDW: 18.2 % — ABNORMAL HIGH (ref 11.5–15.5)
RDW: 19.3 % — ABNORMAL HIGH (ref 11.5–15.5)
WBC: 10 10*3/uL (ref 4.0–10.5)
WBC: 6.7 10*3/uL (ref 4.0–10.5)
WBC: 8.3 10*3/uL (ref 4.0–10.5)
WBC: 8.5 10*3/uL (ref 4.0–10.5)
WBC: 11.5 10*3/uL — ABNORMAL HIGH (ref 4.0–10.5)
WBC: 19.1 10*3/uL — ABNORMAL HIGH (ref 4.0–10.5)
WBC: 20.2 10*3/uL — ABNORMAL HIGH (ref 4.0–10.5)
WBC: 25.3 10*3/uL — ABNORMAL HIGH (ref 4.0–10.5)
WBC: 28.7 10*3/uL — ABNORMAL HIGH (ref 4.0–10.5)

## 2010-12-02 LAB — PREPARE FRESH FROZEN PLASMA

## 2010-12-02 LAB — MAGNESIUM
Magnesium: 2.3 mg/dL (ref 1.5–2.5)
Magnesium: 2.3 mg/dL (ref 1.5–2.5)
Magnesium: 2 mg/dL (ref 1.5–2.5)
Magnesium: 2.2 mg/dL (ref 1.5–2.5)
Magnesium: 2.6 mg/dL — ABNORMAL HIGH (ref 1.5–2.5)
Magnesium: 2.9 mg/dL — ABNORMAL HIGH (ref 1.5–2.5)

## 2010-12-02 LAB — DIC (DISSEMINATED INTRAVASCULAR COAGULATION)PANEL
Fibrinogen: >800 mg/dL — ABNORMAL HIGH (ref 204–475)
INR: 7.82 (ref 0.00–1.49)
Prothrombin Time: 65.2 seconds — ABNORMAL HIGH (ref 11.6–15.2)
aPTT: 61 seconds — ABNORMAL HIGH (ref 24–37)

## 2010-12-02 LAB — LACTIC ACID, PLASMA
Lactic Acid, Venous: 1.5 mmol/L (ref 0.5–2.2)
Lactic Acid, Venous: 1.6 mmol/L (ref 0.5–2.2)
Lactic Acid, Venous: 1.9 mmol/L (ref 0.5–2.2)

## 2010-12-02 LAB — URINALYSIS, ROUTINE W REFLEX MICROSCOPIC
Ketones, ur: NEGATIVE mg/dL
Nitrite: NEGATIVE
Protein, ur: 100 mg/dL — AB

## 2010-12-02 LAB — HEPATIC FUNCTION PANEL
ALT: 42 U/L (ref 0–53)
Bilirubin, Direct: 2.2 mg/dL — ABNORMAL HIGH (ref 0.0–0.3)
Indirect Bilirubin: 1.4 mg/dL — ABNORMAL HIGH (ref 0.3–0.9)
Total Protein: 7 g/dL (ref 6.0–8.3)

## 2010-12-02 LAB — CARDIAC PANEL(CRET KIN+CKTOT+MB+TROPI)
CK, MB: 3.2 ng/mL (ref 0.3–4.0)
CK, MB: 3.3 ng/mL (ref 0.3–4.0)
CK, MB: 4.4 ng/mL — ABNORMAL HIGH (ref 0.3–4.0)
CK, MB: 4.7 ng/mL — ABNORMAL HIGH (ref 0.3–4.0)
CK, MB: 4.9 ng/mL — ABNORMAL HIGH (ref 0.3–4.0)
Relative Index: 2.1 (ref 0.0–2.5)
Relative Index: INVALID (ref 0.0–2.5)
Relative Index: INVALID (ref 0.0–2.5)
Total CK: 111 U/L (ref 7–232)
Total CK: 122 U/L (ref 7–232)
Total CK: 201 U/L (ref 7–232)
Total CK: 226 U/L (ref 7–232)
Total CK: 83 U/L (ref 7–232)
Troponin I: 0.03 ng/mL (ref 0.00–0.06)
Troponin I: 0.04 ng/mL (ref 0.00–0.06)
Troponin I: 0.07 ng/mL — ABNORMAL HIGH (ref 0.00–0.06)
Troponin I: 0.08 ng/mL — ABNORMAL HIGH (ref 0.00–0.06)

## 2010-12-02 LAB — PROTIME-INR
INR: 1.41 (ref 0.00–1.49)
INR: 1.5 — ABNORMAL HIGH (ref 0.00–1.49)
INR: 1.56 — ABNORMAL HIGH (ref 0.00–1.49)
INR: 1.7 — ABNORMAL HIGH (ref 0.00–1.49)
INR: 1.74 — ABNORMAL HIGH (ref 0.00–1.49)
INR: 2.33 — ABNORMAL HIGH (ref 0.00–1.49)
INR: 1.68 — ABNORMAL HIGH (ref 0.00–1.49)
INR: 1.82 — ABNORMAL HIGH (ref 0.00–1.49)
INR: 2.06 — ABNORMAL HIGH (ref 0.00–1.49)
Prothrombin Time: 17.5 seconds — ABNORMAL HIGH (ref 11.6–15.2)
Prothrombin Time: 18.3 seconds — ABNORMAL HIGH (ref 11.6–15.2)
Prothrombin Time: 18.9 seconds — ABNORMAL HIGH (ref 11.6–15.2)
Prothrombin Time: 19.5 seconds — ABNORMAL HIGH (ref 11.6–15.2)
Prothrombin Time: 20.5 seconds — ABNORMAL HIGH (ref 11.6–15.2)
Prothrombin Time: 21.1 seconds — ABNORMAL HIGH (ref 11.6–15.2)
Prothrombin Time: 22.3 seconds — ABNORMAL HIGH (ref 11.6–15.2)
Prothrombin Time: 23.4 seconds — ABNORMAL HIGH (ref 11.6–15.2)
Prothrombin Time: 23.5 seconds — ABNORMAL HIGH (ref 11.6–15.2)
Prothrombin Time: 26.2 seconds — ABNORMAL HIGH (ref 11.6–15.2)
Prothrombin Time: 39.8 seconds — ABNORMAL HIGH (ref 11.6–15.2)
Prothrombin Time: 59.4 seconds — ABNORMAL HIGH (ref 11.6–15.2)

## 2010-12-02 LAB — URINE MICROSCOPIC-ADD ON

## 2010-12-02 LAB — CULTURE, RESPIRATORY W GRAM STAIN

## 2010-12-02 LAB — POCT I-STAT 7, (LYTES, BLD GAS, ICA,H+H)
Acid-base deficit: 21 mmol/L — ABNORMAL HIGH (ref 0.0–2.0)
Calcium, Ion: 1.13 mmol/L (ref 1.12–1.32)
O2 Saturation: 100 %
Potassium: 4.2 mEq/L (ref 3.5–5.1)
Sodium: 138 mEq/L (ref 135–145)

## 2010-12-02 LAB — CLOSTRIDIUM DIFFICILE EIA: C difficile Toxins A+B, EIA: NEGATIVE

## 2010-12-02 LAB — BRAIN NATRIURETIC PEPTIDE: Pro B Natriuretic peptide (BNP): 664 pg/mL — ABNORMAL HIGH (ref 0.0–100.0)

## 2010-12-02 LAB — FERRITIN: Ferritin: 157 ng/mL (ref 22–322)

## 2010-12-02 LAB — APTT
aPTT: 30 seconds (ref 24–37)
aPTT: 35 seconds (ref 24–37)
aPTT: 35 seconds (ref 24–37)

## 2010-12-02 LAB — HIV 1/2 CONFIRMATION
HIV-1 antibody: NEGATIVE
HIV-2 Ab: NEGATIVE

## 2010-12-02 LAB — RENAL FUNCTION PANEL
Albumin: 2 g/dL — ABNORMAL LOW (ref 3.5–5.2)
Albumin: 2.4 g/dL — ABNORMAL LOW (ref 3.5–5.2)
BUN: 108 mg/dL — ABNORMAL HIGH (ref 6–23)
Calcium: 8.1 mg/dL — ABNORMAL LOW (ref 8.4–10.5)
Chloride: 110 mEq/L (ref 96–112)
Creatinine, Ser: 3.08 mg/dL — ABNORMAL HIGH (ref 0.4–1.5)
GFR calc non Af Amer: 40 mL/min — ABNORMAL LOW (ref >60)
Phosphorus: 3.8 mg/dL (ref 2.3–4.6)
Phosphorus: 6.1 mg/dL — ABNORMAL HIGH (ref 2.3–4.6)
Potassium: 3.9 mEq/L (ref 3.5–5.1)
Sodium: 151 mEq/L — ABNORMAL HIGH (ref 135–145)

## 2010-12-02 LAB — BILIRUBIN, TOTAL: Total Bilirubin: 5 mg/dL — ABNORMAL HIGH (ref 0.3–1.2)

## 2010-12-02 LAB — HEPATITIS PANEL, ACUTE
HCV Ab: NEGATIVE
Hep A IgM: NEGATIVE

## 2010-12-02 LAB — PHOSPHORUS
Phosphorus: 4.5 mg/dL (ref 2.3–4.6)
Phosphorus: 4.7 mg/dL — ABNORMAL HIGH (ref 2.3–4.6)
Phosphorus: 5.3 mg/dL — ABNORMAL HIGH (ref 2.3–4.6)
Phosphorus: 7 mg/dL — ABNORMAL HIGH (ref 2.3–4.6)

## 2010-12-02 LAB — HEMOGLOBIN AND HEMATOCRIT, BLOOD
HCT: 28.8 % — ABNORMAL LOW (ref 39.0–52.0)
Hemoglobin: 8.7 g/dL — ABNORMAL LOW (ref 13.0–17.0)

## 2010-12-02 LAB — CROSSMATCH: Antibody Screen: NEGATIVE

## 2010-12-02 LAB — MRSA PCR SCREENING: MRSA by PCR: POSITIVE — AB

## 2010-12-02 LAB — IRON AND TIBC
Saturation Ratios: 8 % — ABNORMAL LOW (ref 20–55)
TIBC: 182 ug/dL — ABNORMAL LOW (ref 215–435)
UIBC: 167 ug/dL

## 2010-12-02 LAB — TYPE AND SCREEN

## 2010-12-02 LAB — ABO/RH: ABO/RH(D): O NEG

## 2010-12-02 LAB — CARBOXYHEMOGLOBIN
Carboxyhemoglobin: 1.8 % — ABNORMAL HIGH (ref 0.5–1.5)
Methemoglobin: 0.8 % (ref 0.0–1.5)
O2 Saturation: 78.7 %

## 2010-12-02 LAB — POCT CARDIAC MARKERS: Troponin i, poc: <0.05 ng/mL (ref 0.00–0.09)

## 2010-12-02 LAB — VANCOMYCIN, TROUGH: Vancomycin Tr: 29.7 ug/mL (ref 10.0–20.0)

## 2010-12-02 LAB — PROCALCITONIN: Procalcitonin: 16.91 ng/mL

## 2010-12-02 LAB — FOLATE: Folate: 10.7 ng/mL

## 2010-12-02 LAB — VANCOMYCIN, RANDOM: Vancomycin Rm: <5 ug/mL

## 2010-12-02 LAB — HIV-1 RNA QUANT-NO REFLEX-BLD: HIV-1 RNA Quant, Log: <1.3 {Log} (ref <1.30)

## 2010-12-02 LAB — OSMOLALITY, URINE: Osmolality, Ur: 459 mOsm/kg (ref 390–1090)

## 2010-12-02 LAB — D-DIMER, QUANTITATIVE: D-Dimer, Quant: 4.15 ug/mL-FEU — ABNORMAL HIGH (ref 0.00–0.48)

## 2010-12-05 LAB — BLOOD GAS, ARTERIAL
Acid-Base Excess: 7.6 mmol/L — ABNORMAL HIGH (ref 0.0–2.0)
Drawn by: 27605
O2 Content: 3 L/min
O2 Saturation: 94.5 %
Patient temperature: 98.6

## 2010-12-27 ENCOUNTER — Encounter: Payer: Self-pay | Admitting: Pulmonary Disease

## 2010-12-28 ENCOUNTER — Ambulatory Visit (INDEPENDENT_AMBULATORY_CARE_PROVIDER_SITE_OTHER): Payer: Medicare Other | Admitting: Pulmonary Disease

## 2010-12-28 ENCOUNTER — Encounter: Payer: Self-pay | Admitting: Pulmonary Disease

## 2010-12-28 DIAGNOSIS — J438 Other emphysema: Secondary | ICD-10-CM

## 2010-12-28 DIAGNOSIS — E678 Other specified hyperalimentation: Secondary | ICD-10-CM

## 2010-12-28 NOTE — Assessment & Plan Note (Signed)
The pt has very mild airflow obstruction, and therefore I believe spiriva is not critical to his treatment regimen.  I would like him to at least have a rescue inhaler available if needed.

## 2010-12-28 NOTE — Progress Notes (Signed)
  Subjective:    Patient ID: William Braun, male    DOB: 06-22-1933, 77 y.o.   MRN: [id number]  HPI The pt comes in today for f/u of his known chronic resp failure secondary to primarily OHS/ biventricular failure, as well as mild copd.  He has stopped spiriva on his own, but is staying on bipap compliantly.  He does feel that he rests well with the device.  He is also wearing oxygen compliantly.  He feels that his chronic doe is at baseline, but he has been getting stronger and able to do some things around the house.  He has a mild cough with minimal clear mucus, but thinks it may be coming from allergies?   Review of Systems  Constitutional: Negative for fever and unexpected weight change.  HENT: Positive for rhinorrhea. Negative for ear pain, nosebleeds, congestion, sore throat, sneezing, trouble swallowing, dental problem, postnasal drip and sinus pressure.   Eyes: Negative for redness and itching.  Respiratory: Positive for cough, chest tightness and shortness of breath. Negative for wheezing.   Cardiovascular: Negative for palpitations and leg swelling.  Gastrointestinal: Negative for nausea and vomiting.  Genitourinary: Negative for dysuria.  Musculoskeletal: Negative for joint swelling.  Skin: Negative for rash.  Neurological: Negative for headaches.  Hematological: Bruises/bleeds easily.  Psychiatric/Behavioral: Negative for dysphoric mood. The patient is not nervous/anxious.        Objective:   Physical Exam Obese male in nad  No skin breakdown or pressure necrosis from cpap mask  Chest with mild basilar crackles, no wheezing Cor distant and irregular, 2/6 sem LE with 2 + pedal edema and venous stasis changes Alert, oriented, moves all 4        Assessment & Plan:

## 2010-12-28 NOTE — Patient Instructions (Signed)
Ok to stay off spiriva, but would continue albuterol as a rescue inhaler if needed. Continue on bipap. This is very important. Continue to work on weight loss followup with me in 4mos

## 2010-12-28 NOTE — Assessment & Plan Note (Addendum)
The pt needs to stay on bilevel at hs, and I have reviewed why this is important with him.  I have also encouraged him to continue working on weight loss.

## 2011-01-04 LAB — CBC
HCT: 37.1 % — ABNORMAL LOW (ref 39.0–52.0)
HCT: 40.1 % (ref 39.0–52.0)
HCT: 49.8 % (ref 39.0–52.0)
Hemoglobin: 12.2 g/dL — ABNORMAL LOW (ref 13.0–17.0)
Hemoglobin: 13 g/dL (ref 13.0–17.0)
Hemoglobin: 13.9 g/dL (ref 13.0–17.0)
MCHC: 35 g/dL (ref 30.0–36.0)
MCV: 91.1 fL (ref 78.0–100.0)
Platelets: 114 10*3/uL — ABNORMAL LOW (ref 150–400)
Platelets: 126 10*3/uL — ABNORMAL LOW (ref 150–400)
RBC: 3.81 MIL/uL — ABNORMAL LOW (ref 4.22–5.81)
RDW: 14.2 % (ref 11.5–15.5)
RDW: 14.4 % (ref 11.5–15.5)
WBC: 13.6 10*3/uL — ABNORMAL HIGH (ref 4.0–10.5)
WBC: 9.7 10*3/uL (ref 4.0–10.5)

## 2011-01-04 LAB — GLUCOSE, CAPILLARY
Glucose-Capillary: 112 mg/dL — ABNORMAL HIGH (ref 70–99)
Glucose-Capillary: 114 mg/dL — ABNORMAL HIGH (ref 70–99)
Glucose-Capillary: 118 mg/dL — ABNORMAL HIGH (ref 70–99)
Glucose-Capillary: 123 mg/dL — ABNORMAL HIGH (ref 70–99)
Glucose-Capillary: 136 mg/dL — ABNORMAL HIGH (ref 70–99)
Glucose-Capillary: 141 mg/dL — ABNORMAL HIGH (ref 70–99)
Glucose-Capillary: 141 mg/dL — ABNORMAL HIGH (ref 70–99)
Glucose-Capillary: 142 mg/dL — ABNORMAL HIGH (ref 70–99)
Glucose-Capillary: 147 mg/dL — ABNORMAL HIGH (ref 70–99)
Glucose-Capillary: 148 mg/dL — ABNORMAL HIGH (ref 70–99)
Glucose-Capillary: 148 mg/dL — ABNORMAL HIGH (ref 70–99)
Glucose-Capillary: 156 mg/dL — ABNORMAL HIGH (ref 70–99)
Glucose-Capillary: 161 mg/dL — ABNORMAL HIGH (ref 70–99)

## 2011-01-04 LAB — COMPREHENSIVE METABOLIC PANEL
AST: 26 U/L (ref 0–37)
Albumin: 3.7 g/dL (ref 3.5–5.2)
Alkaline Phosphatase: 32 U/L — ABNORMAL LOW (ref 39–117)
Alkaline Phosphatase: 41 U/L (ref 39–117)
BUN: 19 mg/dL (ref 6–23)
BUN: 20 mg/dL (ref 6–23)
CO2: 37 mEq/L — ABNORMAL HIGH (ref 19–32)
Chloride: 100 mEq/L (ref 96–112)
Chloride: 101 mEq/L (ref 96–112)
GFR calc Af Amer: >60 mL/min (ref >60)
GFR calc non Af Amer: 56 mL/min — ABNORMAL LOW (ref >60)
Glucose, Bld: 138 mg/dL — ABNORMAL HIGH (ref 70–99)
Potassium: 3.7 mEq/L (ref 3.5–5.1)
Potassium: 3.8 mEq/L (ref 3.5–5.1)
Total Bilirubin: 0.9 mg/dL (ref 0.3–1.2)
Total Bilirubin: 0.9 mg/dL (ref 0.3–1.2)
Total Protein: 6.2 g/dL (ref 6.0–8.3)

## 2011-01-04 LAB — HEMOGLOBIN A1C
Hgb A1c MFr Bld: 6.5 % — ABNORMAL HIGH (ref 4.6–6.1)
Mean Plasma Glucose: 140 mg/dL

## 2011-01-04 LAB — URINE MICROSCOPIC-ADD ON

## 2011-01-04 LAB — URINALYSIS, ROUTINE W REFLEX MICROSCOPIC
Bilirubin Urine: NEGATIVE
Glucose, UA: NEGATIVE mg/dL
Hgb urine dipstick: NEGATIVE
Protein, ur: NEGATIVE mg/dL

## 2011-01-04 LAB — PROTIME-INR
INR: 2.4 — ABNORMAL HIGH (ref 0.00–1.49)
Prothrombin Time: 27.3 seconds — ABNORMAL HIGH (ref 11.6–15.2)

## 2011-01-04 LAB — BASIC METABOLIC PANEL
BUN: 25 mg/dL — ABNORMAL HIGH (ref 6–23)
CO2: 34 mEq/L — ABNORMAL HIGH (ref 19–32)
Calcium: 8.3 mg/dL — ABNORMAL LOW (ref 8.4–10.5)
Chloride: 96 mEq/L (ref 96–112)
Creatinine, Ser: 1.15 mg/dL (ref 0.4–1.5)
GFR calc Af Amer: >60 mL/min (ref >60)
Glucose, Bld: 135 mg/dL — ABNORMAL HIGH (ref 70–99)
Potassium: 4 mEq/L (ref 3.5–5.1)
Sodium: 136 mEq/L (ref 135–145)
Sodium: 137 mEq/L (ref 135–145)

## 2011-01-04 LAB — APTT: aPTT: 24 seconds (ref 24–37)

## 2011-02-01 NOTE — Discharge Summary (Signed)
William Braun, William Braun                   ACCOUNT NO.:  [id number]   MEDICAL RECORD NO.:  [id number]          PATIENT TYPE:  INP   LOCATION:  1619                         FACILITY:  [facility]   PHYSICIAN:  Ollen Gross, M.D.    DATE OF BIRTH:  Jun 23, 1933   DATE OF ADMISSION:  10/27/2008  DATE OF DISCHARGE:  10/31/2008                               DISCHARGE SUMMARY   ADMITTING DIAGNOSES:  1. Bilateral knees osteoarthritis, right greater than left.  2. Hypertension.  3. Hypercholesterolemia.  4. Non-insulin-dependent diabetes mellitus.  5. Obesity.  6. History of mild cerebrovascular accident.   DISCHARGE DIAGNOSES:  1. Osteoarthritis, right knee, status post right total knee      replacement arthroplasty.  2. Osteoarthritis, left knee.  3. New onset atrial fibrillation.  4. Mild postoperative renal insufficiency, improved.  5. Hypertension.  6. Hypercholesterolemia.  7. Non-insulin-dependent diabetes mellitus.  8. Obesity.  9. History of mild cerebrovascular accident.  10.Mild thrombocytopenia, stable.   PROCEDURE:  October 27, 2008, right total knee.  Surgeon Dr. Lequita Halt,  assistant Avel Peace, PA-C.  Anesthesia general.  Postop Marcaine pain  pump.  Tourniquet time 43 minutes.   CONSULTANT:  [facility], Dr. Excell Seltzer   BRIEF HISTORY:  The patient is a 77 year old male with end-stage  arthritis, right knee, progressively worsening pain and dysfunction,  failed nonoperative management, now presents for right total knee  arthroplasty.   LABORATORY DATA:  Preop CBC showed hemoglobin 17.3, hematocrit of 49.8,  white cell count 9.7, platelets 126.  PT/INR on admission 14.6 and 1.1  with PTT of 24.  Chem panel on admission:  Minimally elevated glucose of  107.  Remaining chem panel all within normal limits.  Preop UA did show  small leukocyte esterase with 7-10 white cells, 0-2 red cells, few  epithelials, otherwise negative.  Serial CBCs were followed throughout  hospital  course.  Hemoglobin dropped down to 13.9 then 13, last H and H  12.2 and 34.8.  Serial pro-times followed per Coumadin protocol.  Last  PT/INR 31.0 and 2.7.  Chem panel on admission did show slight increase  of the creatinine from 1.0-1.3 back down to 1.1.  BUN went up from 19-25  stabilized at 25 prior to discharge.  Remaining electrolytes remained  within normal limits.  Hemoglobin A1c, taken on October 28, 2008, was  slightly elevated at 6.5.  BNP taken on postop day #1 normal at 73.  He  had a TSH level done on postop day #1 that was normal at 1.718.   X-RAYS:  Two-view chest, October 28, 2008:  Cardiac enlargement, mild  vascular congestion, no pulmonary edema, right middle lobe atelectasis.   EKG taken on October 27, 2008:  Atrial fibrillation, left axis  deviation, confirmed by Dr. Tonny Bollman.  Follow-up EKG on October 28, 2008:  Atrial fibrillation, incomplete right bundle branch block.  No  significant change.  Confirmed by Dr. Aggie Cosier.   HOSPITAL COURSE:  The patient was admitted to [facility],  taken to the OR and underwent above-stated procedure without  complication.  He tolerated procedure well but unfortunately developed  some slight irregular rhythm.  He was seen and felt that he had  developed some atrial fib during the surgery and in the recovery room.  A 12-lead was taken which did confirm the atrial fibrillation.  Cardiology consult was called due to a new diagnosis of atrial fib he  was seen by cardiology and felt that, with his heart rate now between  90s and 110, that he would be appropriate candidate for beta-blocker  rate control.  He had a little bit of volume overload and only given  some mild Lasix just to help with some diuresis from the fluid from  surgery.  He did well that night.  By the next morning he was rate-  controlled in the 70s.  His heart rate ranged from 70 to 90 during  rounds.  His BNP was normal.  He was on metformin, but  we held it  postoperatively until his renal function was established and he was  eating well.  He did have a little slight increase his creatinine from  1.0 to 1.2 .  He was started on Lopressor by mouth.  He was also started  on Coumadin for DVT prophylaxis.  Our normal routine would be just for 3  weeks but due to the onset of atrial fib and also based on  recommendations by the cardiologist, he would need long-term Coumadin  therapy now, especially with his history of new-onset atrial fib and  also his history of stroke.  He would be recommended to follow back up  with his medical in postop period.  He was seen by cardiology and his  metoprolol, which was initially started a 25 was increased to 50 twice a  day.  He remained rate-controlled.  He was moved to the telemetry floor  postoperatively, remained on the telemetry for several days for  monitoring.  Once the patient had remained rate-controlled, despite the  fact he was in atrial fib first couple of days, it was felt he could go  up to the orthopedic floor once he converted over.  By day #2, his  creatinine had bumped up to 1.3 so his Cozaar ARB medication was held  temporarily.  He stayed on the metoprolol and his blood pressure was  well-controlled.  Dressing change on day #2.  Incision was healing well.  Once he was rate-controlled, we did start getting him up with physical  therapy in the postop period.  He started getting up out of bed, walking  in the room, and by day #2 was walking short distances in the room and  hallway, walking about 20-40 feet.  It was felt that he would need some  type of  skilled nursing facility so we got social worker involved.  He  was from [place] and wanted to look into the [facility] facility  over in that area, but we had the paper work signed and they were going  to try to locate a facility for the patient.  By day #3 he was under  good control.  His creatinine was back down so the Cozaar  was resumed  per Dr. Excell Seltzer and felt that he could be off a monitored bed at this  time so he was transferred up to the orthopedic floor on day #3.  The FL-  2 had been signed.  Incision continued to heal well.  He was seen on day  #4 on October 31, 2008.  The  patient is doing well, no complaints.  Diet was improving.  His creatinine was back down, so we would resume  his metformin.  Arrangements were being made.  They were looking into  confirmed final bed for placement.  He was recommend to be on long-term  Coumadin and we will have the patient follow back up with Dr. [name], who will be his cardiologist.  Recommended that he follow up  with Dr. Jeannetta Nap from a primary standpoint.  Arrangements are being made  for long-term Coumadin.  If the bed was available, we decided he would  be transferred out at that time.   DISCHARGE/PLAN:  Patient to be transferred to skilled nursing facility  of choice.  The bed is pending.  Facility is pending at the time of this  dictation.  Possible transfer today of October 31, 2008.   DISCHARGE DIAGNOSIS:  Please see above.   DISCHARGE MEDS:  Current medications include:   1. Coumadin protocol.  Please titrate Coumadin level for target INR      between 2.0 and 3.0 based on cardiology recommendations.  He will      need long-term Coumadin treatment because of the new-onset atrial      fib and based on Dr. Earmon Phoenix notes in the chart he will make      follow-up arrangements to see Dr. Shirlee Latch for cardiac appointment      and further care.  2. Colace 100 mg p.o. b.i.d.  3. Hydrochlorothiazide 25 mg p.o. daily.  4. Metformin 500 mg p.o. b.i.d.  5. Pravastatin 40 mg q.a.m.  6. (New med) Lopressor 50 mg p.o. b.i.d.  7. Cozaar 50 mg daily.  8. Percocet 5 mg one or two every 4-6 hours as needed for moderate      pain.  9. Tylenol 325 one or two every 4-6 hours day for mild pain, temper      headache.  10.Robaxin 500 mg p.o. q. 6-8 hours p.r.n.  spasm.   DIET:  Diabetic cardiac heart-healthy diet.   ACTIVITY:  He is weightbearing as tolerated to the right lower  extremity, total-knee protocol with range of motion exercises for  strength training.  He may start showering.  However do not submerge the  incision under water.  Daily dressing change to the knee.   FOLLOW UP:  He needs to follow up with Dr. Ollen Gross approximately 2  weeks from the date of surgery.  Please contact the office at [phone]  to help arrange appointment follow-up for this patient at the [facility] office, [facility] with Dr. Ollen Gross.    Follow-up is being arranged for cardiac appointment with Dr. [name].  The patient will need long-term Coumadin treatment, will rely  on cardiology for recommendations for length and treatment.   DISPOSITION:  Pending at the time of this dictation, awaiting final bed  offers.   CONDITION ON DISCHARGE:  Improving.      Alexzandrew L. Perkins, P.A.C.      Ollen Gross, M.D.  Electronically Signed    ALP/MEDQ  D:  10/31/2008  T:  10/31/2008  Job:  [id number]   cc:   [name], MD  [address]  [place] [address]   Veverly Fells. Excell Seltzer, MD  [address]  [place], [place] [address]   Skilled Nursing Facility

## 2011-02-01 NOTE — Consult Note (Signed)
NAMECASTIN, DONAGHUE NO.:  [id number]   MEDICAL RECORD NO.:  [id number]          PATIENT TYPE:  INP   LOCATION:  0006                         FACILITY:  [facility]   PHYSICIAN:  [name], MD      DATE OF BIRTH:  01/15/1933   DATE OF CONSULTATION:  10/27/2008  DATE OF DISCHARGE:                                 CONSULTATION   Cardiologist will be [name], MD.  Primary care is Dr. Hart Carwin  and PA Esperanza Richters at [place].  We were asked to consult Mr. Shugars  regarding postoperative atrial fib with a controlled rate.  Mr. poor  underwent a total right knee on this date by Dr. Lequita Halt, currently in  the PACU unit with noted atrial fib confirmed by 12-lead EKG at a rate  of 82.  Mr. Arkwright is asymptomatic, denying any symptoms suggestive of  angina.  Surgical procedure uneventful according to documentation.  Mr.  Neubert states the atrial fib is new. However, he reports that he has been  getting worked over for the last month for his surgery, and he was told  he had an irregular heart rhythm. But it is unclear whether or not that  heart rhythm was atrial fib and what measures have been taken to treat  that at this time. Mr. Keltner unfortunately is still under the effects of  some sedation and is not a very good historian at this time.  However,  he does complain of some dyspnea on exertion over the last few weeks. He  apparently had a CVA back in 2008. Details are unavailable at this time.  No previous medical records available.  He also complains of some  balance problems. He states that when he first stands up it takes him a  little while to get his bearing. He feels like he has some decreased  sensation in his lower extremities. He is a known diabetic, but states  that is well controlled, and he is compliant with his blood pressure  medicines.   Past medical history includes questionable new onset of atrial fib  hypertension, obesity, CVA in 2008, dyslipidemia,  diabetes type 2, a  previous vasectomy, and degenerative joint disease.   SOCIAL HISTORY:  He lives in [place] with his wife.  He is not  employed. He quit smoking 35 years ago.  He tries to follow an ADA diet.  He denies any drug or herbal medication use.  Exercise limited secondary  to knee.   FAMILY HISTORY:  Both parents deceased in a motor vehicle accident.   REVIEW OF SYSTEMS:  Positive for dyspnea on exertion and balance  problems, myalgias, arthralgias, pain in joints.   ALLERGIES:  Include QUININE.   CURRENT MEDICATIONS AT HOME:  Medicines at home include metformin 500 mg  b.i.d., Cozaar 58, HCTZ 25, fish oil, multivitamin, aspirin.   PHYSICAL EXAM:  VITAL SIGNS:  Heart rate is currently 100, respirations  20, blood pressure 159/74, O2 sat 95% on 2 liters.  GENERAL:  In no  acute distress.  HEENT: Unremarkable.  NECK:  Signs of JVD around 10 cm at 45-degree angle.  LUNGS: Clear to auscultation with distant breath sounds in bilateral  bases.  CARDIOVASCULAR:  Exam reveals S1-S2, regular rhythm, do not hear any  murmurs, rubs, or gallops.  SKIN:  Warm and dry.  ABDOMEN:  Soft, nontender, positive bowel sounds.  Obese.  LOWER EXTREMITIES: Without clubbing, cyanosis or edema.  NEUROLOGICAL:  Alert and oriented.   Chest x-ray is pending.  EKG atrial fib at a rate of 82. No acute ST- or  T-wave changes.  Lab work is pending.  The patient did have blood work  drawn on October 20, 2008.  Hematocrit 49.8, platelets 126,000.  He also  had potassium drawn on that same day of 3.8, creatinine 1.   IMPRESSION:  1. Atrial fibrillation, controlled rate, unclear etiology, patient      with the CHADS score of 5. The past medical history also includes      hypertension, dyslipidemia, and diabetes.  Will resume the      patient's home medication, use Lopressor for rate control.  The      patient is an ideal candidate for anticoagulation therapy.  We will      start when cleared by  surgery. Also will need to resume aspirin      therapy.  Check TSH.  The patient also had signs suggestive of      volume overload, question whether or not he may have underlying      diastolic dysfunction. Will give 40 of Lasix here at x1 and      schedule a 2-D echocardiogram and continue to follow. Dr. Shirlee Latch      has been in to examine and assess the patient and agrees with plan      of care      Dorian Pod, ACNP      [name], MD  Electronically Signed    MB/MEDQ  D:  10/27/2008  T:  10/27/2008  Job:  [id number]

## 2011-02-01 NOTE — Op Note (Signed)
William Braun, William Braun                   ACCOUNT NO.:  [id number]   MEDICAL RECORD NO.:  [id number]          PATIENT TYPE:  INP   LOCATION:  0006                         FACILITY:  [facility]   PHYSICIAN:  Ollen Gross, M.D.    DATE OF BIRTH:  04/16/1933   DATE OF PROCEDURE:  10/27/2008  DATE OF DISCHARGE:                               OPERATIVE REPORT   PREOPERATIVE DIAGNOSIS:  Osteoarthritis, right knee.   POSTOPERATIVE DIAGNOSIS:  Osteoarthritis, right knee.   PROCEDURE:  Right total knee arthroplasty.   SURGEON:  Ollen Gross, M.D.   ASSISTANT:  Alexzandrew L. Perkins, PA-C   ANESTHESIA:  General, with postop Marcaine pain pump.   ESTIMATED BLOOD LOSS:  Minimal.   DRAIN:  Hemovac x1.   TOURNIQUET TIME:  43 minutes at 300 mmHg.   COMPLICATIONS:  None.   CONDITION:  Stable to recovery.   BRIEF CLINICAL NOTE:  William Braun is a 77 year old male with end-stage  arthritis of the right knee with progressively worsening pain and  dysfunction.  He has failed nonoperative management and presents for  right total knee arthroplasty.   PROCEDURE IN DETAIL:  After the successful administration of general  anesthetic, tourniquet was placed high on the right thigh and the right  lower extremity prepped and draped in the usual sterile fashion.  The  extremity was wrapped in Esmarch, knee flexed, tourniquet inflated to  300 mmHg.  Midline incision was made with a 10 blade through  subcutaneous tissue to the level of the extensor mechanism.  A fresh  blade was used to make a medial parapatellar arthrotomy.  Soft tissue  over the proximal medial tibia was subperiosteally elevated to the joint  line with the knife and to the semimembranosus bursa with a Cobb  elevator.  Soft tissue laterally was elevated with attention being paid  to avoiding the patellar tendon on tibial tubercle.  Patella was  subluxed laterally, knee flexed 90 degrees, ACL and PCL removed.  Drill  was used to create a  starting hole in the distal femur and the canal was  thoroughly irrigated.  A 5-degree right valgus alignment guide was  placed and referencing off the posterior condyles rotations marked and  block pinned to remove 10 mm off the distal femur.  Distal femoral  resection was made with an oscillating saw.  Sizing block was placed,  size 6 is most appropriate.  Rotations marked at the epicondylar axis.  Size 6 cutting block was placed and the anterior, posterior and chamfer  cuts made.   The tibia subluxed forward and menisci removed.  Extramedullary tibial  alignment guide was placed, referencing proximally to medial aspect of  the tibial tubercle and distally along the second metatarsal axis and  tibial crest.  Block was pinned to remove 10 mm off the non-deficient  lateral side.  Tibial resection was made with an oscillating saw.  Size  5 was the most appropriate tibial component and the proximal tibia was  prepared with the modular drill and keel punch for the size 5.  Femoral  preparation was  completed with the intercondylar cut for the size 6.   Size 5 mobile bearing tibial trial, size 6 posterior stabilized femoral  trial, and a 10-mm posterior stabilized rotating platform insert trial  are placed.  With the 10 there was a tiny bit of hyperextension so I  went to 12/5 which allowed for full extension with excellent varus-  valgus and anterior-posterior balance throughout full range of motion.  The patella was everted, thickness measured to be 27 mm.  Freehand  resection was taken to 15 mm, 41 template was placed, lug holes were  drilled, trial patella was placed and it tracks normally.  Osteophytes  were removed off the posterior femur with the trial in place.  All  trials removed and the cut bone surfaces prepared with pulsatile lavage.  Cement was mixed and once ready for implantation the size 5 mobile  bearing tibial tray, size 6 posterior stabilized femur and 41 patella  are  cemented into place.  Patella was held with a clamp.  Trial 12.5-mm  insert was placed, knee held in full extension, and all extruded cement  removed.  When the cement was fully hardened, then the permanent 12.5-mm  posterior stabilized rotating platform insert was placed into the tibial  tray.  The wound was copiously irrigated with saline solution.  FloSeal  was injected into the posterior capsule, medial and lateral gutters and  suprapatellar area.  Moist sponge was placed and tourniquet released for  total time of 43 minutes.  The sponge was removed after 2 minutes.  There was a fair amount of oozing.  We stopped all noticeable bleeding  points.  We thoroughly irrigated again and then closed the arthrotomy  over a Hemovac drain with interrupted #1 PDS.  Flexion against gravity  was 140 degrees.  Subcu was closed with interrupted 2-0 Vicryl and  subcuticular running 4-0 Monocryl.  Catheter for the Marcaine pain pump  was placed and the pump was initiated.  Drain was hooked to suction.  Incision cleaned and dried and a bulky sterile dressing applied.  He was  then placed into knee immobilizer, awakened, and transported to recovery  in stable condition.      Ollen Gross, M.D.  Electronically Signed     FA/MEDQ  D:  10/27/2008  T:  10/27/2008  Job:  [id number]

## 2011-02-04 NOTE — H&P (Signed)
William Braun, William Braun                   ACCOUNT NO.:  [id number]   MEDICAL RECORD NO.:  [id number]          PATIENT TYPE:  INP   LOCATION:  1429                         [facility]AC[facility]   PHYSICIAN:  Ollen Gross, M.D.    DATE OF BIRTH:  1933-06-10   DATE OF ADMISSION:  10/27/2008  DATE OF DISCHARGE:                              HISTORY & PHYSICAL   CHIEF COMPLAINT:  Bilateral knee pain left greater right.   HISTORY OF PRESENT ILLNESS:  the patient is a 77 year old male who has  been seen by Dr. Lequita Halt for ongoing bilateral knee pain.  Both knees  have been hurting for quite some time now, they have progressively  gotten worse, especially over the past 6 months.  He has been seen by  Dr. Lequita Halt and found to have end-stage arthritis of both knees.  The  knees have been alternating and in their severity.  When he initially  was seen back in December the right knee was greater than the left but  most recently the left got worse again but prior to history the right  has been more chronic in nature and more symptomatic overall.  He has  felt he would benefit from undergoing surgical intervention.  Risks and  benefits discussed, he elects to proceed with surgery.   ALLERGIES:  QUININE.   CURRENT MEDICATIONS:  Metformin, Cozaar, pravastatin, Hydrocort,  occasional Aleve, baby aspirin, hydrocodone.   PAST MEDICAL HISTORY:  1. History of mild CVA a little less than 2 years ago.  2. Hypertension.  3. Hypercholesterolemia.  4. Non-insulin dependent diabetic.   PAST SURGICAL HISTORY:  Vasectomy.   FAMILY HISTORY:  No documented medical history although he does state  that both his parents were in an accident.   SOCIAL HISTORY:  Married, past smoker, no alcohol, 3 children.  Does  have a caregiver lined up, lives in [place] home.   REVIEW OF SYSTEMS:  GENERAL:  No fevers, chills, night sweats.  NEURO:  He does give a history of balance problems but no seizures, syncope or  paralysis.   RESPIRATORY:  Little bit of shortness breath on exertion but  no shortness breath at rest.  No productive cough or hemoptysis.  CARDIOVASCULAR:  No chest pain or orthopnea.  GI:  No nausea, vomiting,  diarrhea or constipation.  GU: No dysuria or hematuria or discharge.  MUSCULOSKELETAL:  Bilateral knee pain.   PHYSICAL EXAMINATION:  VITAL SIGNS:  Pulse 84, respirations 12, blood  pressure 150/90.  GENERAL:  A 77 year old white male, well-nourished, well-developed,  taller, large frame, overweight, no acute distress, alert and oriented  and cooperative.  HEENT:  Normocephalic/atraumatic, pupils are round and reactive,  oropharynx clear, EOMs intact.  NECK:  Supple.  CHEST:  Clear anterior/posterior chest walls, no rhonchi, rales or  wheezing.  HEART:  Regular rhythm with occasional skipped/ectopic beat to his heart  rate.  ABDOMEN:  Soft, round, protuberant abdomen, bowel sounds present.  Rectal, breasts, genitalia not done, not pertinent to present illness.  EXTREMITIES:  Left knee range of motion 5-120, no effusion, slight  varus.  Right knee range of motion 5-120, no effusion, slight varus.   IMPRESSION:  1. Bilateral knee osteoarthritis, the right more symptomatic than the      left.  2. Hypertension.  3. Hypercholesterolemia.  4. Non-insulin-dependent diabetes mellitus.  5. Obesity.  6. History of mild stroke.   PLAN:  The patient admitted to [facility] to undergo a right  total knee replacement arthroplasty.  Surgery will be performed by Dr.  Ollen Gross.      Alexzandrew L. Perkins, P.A.C.      Ollen Gross, M.D.  Electronically Signed    ALP/MEDQ  D:  10/27/2008  T:  10/27/2008  Job:  [id number]   cc:   [facility]   Ollen Gross, M.D.  Fax: [phone]

## 2011-02-04 NOTE — Letter (Signed)
April 16, 2010     RE:  LESS, WOOLSEY  MRN:  [id number]  /  DOB:  05/09/1933   To Whom It May Concern:   I am writing on behalf of Mr. William Braun. [name], a 77 year old patient who I  care for with chronic respiratory failure secondary to the obesity  hypoventilation syndrome, biventricular heart failure, secondary  pulmonary hypertension, and very mild COPD.  I have recommended that Mr.  Hamman be treated with a BiPAP ST device, and I understand there is some  concern about coverage and whether he qualifies for this device.  Mr.  Hreha has the obesity hypoventilation syndrome with documented carbon  dioxide retention while awake of 52, and also has a significant thoracic  cage abnormality related to his adiposity constricting his chest wall  excursion.  He has had lung volumes which document restrictive lung  disease.  He does have very mild obstructive disease, but certainly  contributes very little to his symptomatology.  I have reviewed the LCD  qualifications for a respiratory assist device, and I can assure you,  the patient meets every criteria listed.  I believe this special device  is essential to the treatment of his chronic respiratory failure, and  would ask that you consider approval.  If I can be of further assistance  or answer any other questions, please feel free to call.    Sincerely,      Barbaraann Share, MD,FCCP  Electronically Signed    [facility]  DD: 04/16/2010  DT: 04/16/2010  Job #: [id number]

## 2011-02-23 ENCOUNTER — Ambulatory Visit (INDEPENDENT_AMBULATORY_CARE_PROVIDER_SITE_OTHER): Payer: Medicare Other | Admitting: Pulmonary Disease

## 2011-02-23 ENCOUNTER — Encounter: Payer: Self-pay | Admitting: Pulmonary Disease

## 2011-02-23 DIAGNOSIS — J438 Other emphysema: Secondary | ICD-10-CM

## 2011-02-23 DIAGNOSIS — E678 Other specified hyperalimentation: Secondary | ICD-10-CM

## 2011-02-23 DIAGNOSIS — J9610 Chronic respiratory failure, unspecified whether with hypoxia or hypercapnia: Secondary | ICD-10-CM

## 2011-02-23 NOTE — Patient Instructions (Signed)
Will try symbicort 160/4.5  2 inhalations am and pm.  Rinse mouth well.  If you think helps, call for prescription Work on weight loss Monitor fluid balance carefully with daily weights Stay on bipap followup with me in 4mos.

## 2011-02-23 NOTE — Progress Notes (Signed)
  Subjective:    Patient ID: William Braun, male    DOB: [birth year], 77 y.o.   MRN: [id number]  HPI The pt comes in today for his multifactorial chronic respiratory failure.  He has OHS on bipap, biventricular failure, mild copd, and severe debility/deconditioning.  He has been wearing bilevel compliantly, and has no issues with mask or pressure.  He is weighing daily at home, and tells me his weight is very stable.  He is having doe that is a little worse than baseline.  He is not taking maintenance meds for his mild copd, but spiriva in the past did not improve things.  Denies wheezing or chest tightness.  He is still following up with cardiology.    Review of Systems  Constitutional: Negative for fever and unexpected weight change.  HENT: Positive for rhinorrhea. Negative for ear pain, nosebleeds, congestion, sore throat, sneezing, trouble swallowing, dental problem, postnasal drip and sinus pressure.   Eyes: Positive for redness and itching.  Respiratory: Positive for chest tightness, shortness of breath and wheezing. Negative for cough.   Cardiovascular: Positive for leg swelling. Negative for palpitations.  Gastrointestinal: Negative for nausea and vomiting.  Genitourinary: Negative for dysuria.  Musculoskeletal: Negative for joint swelling.  Skin: Negative for rash.  Neurological: Negative for headaches.  Hematological: Bruises/bleeds easily.  Psychiatric/Behavioral: Negative for dysphoric mood. The patient is not nervous/anxious.        Objective:   Physical Exam Morbidly obese male in nad No skin breakdown or pressure necrosis from cpap mask Chest with bibasilar crackles, no wheezing, good airflow Cor with mild irreg, 2/6 sem LE with 2+ edema, no cyanosis  Alert, does not appear sleepy, moves all 4        Assessment & Plan:

## 2011-02-23 NOTE — Assessment & Plan Note (Signed)
The pt has mild obstructive disease, and I suspect it is not contributing a lot to his overall dyspnea.  He has tried spiriva for a short time, and didn't see a difference.  I am willing to try something different to see if it improves his exertional tolerance, but if it does not, would discontinue

## 2011-03-04 NOTE — Assessment & Plan Note (Signed)
The pt is wearing bipap compliantly, and continues to feel it is helping his sleep, breathing, and daytime alertness.  I have stressed the importance to him of keeping up with mask changes and supplies, and also aggressive weight loss.

## 2011-04-26 ENCOUNTER — Ambulatory Visit (INDEPENDENT_AMBULATORY_CARE_PROVIDER_SITE_OTHER): Payer: Medicare Other | Admitting: Pulmonary Disease

## 2011-04-26 ENCOUNTER — Encounter: Payer: Self-pay | Admitting: Pulmonary Disease

## 2011-04-26 DIAGNOSIS — E678 Other specified hyperalimentation: Secondary | ICD-10-CM

## 2011-04-26 DIAGNOSIS — J438 Other emphysema: Secondary | ICD-10-CM

## 2011-04-26 NOTE — Progress Notes (Signed)
  Subjective:    Patient ID: William Braun, male    DOB: August 06, 1933, 77 y.o.   MRN: [id number]  HPI The patient comes in today for followup of his known multifactorial chronic respiratory failure.  He has known obesity hypoventilation syndrome, mild COPD, severe deconditioning and weakness, and finally biventricular failure which requires ongoing diuretic therapy.  Overall, he appears to be doing fairly well.  His chronic dyspnea on exertion is at its usual baseline, but he thinks it did improve on the symbicort.  He is wearing BiPAP compliantly, and is having no issues with pressure or mask fit.  He feels he sleeps well with the device.  He continues to have lower extremity edema, and will require ongoing diuresis.   Review of Systems  Constitutional: Negative.  Negative for fever and unexpected weight change.  HENT: Negative.  Negative for ear pain, nosebleeds, congestion, sore throat, rhinorrhea, sneezing, trouble swallowing, dental problem, postnasal drip and sinus pressure.   Eyes: Negative.  Negative for redness and itching.  Respiratory: Positive for shortness of breath. Negative for cough, chest tightness and wheezing.   Cardiovascular: Negative.  Negative for palpitations and leg swelling.  Gastrointestinal: Negative.  Negative for nausea and vomiting.  Genitourinary: Negative.  Negative for dysuria.  Musculoskeletal: Negative.  Negative for joint swelling.  Skin: Negative.  Negative for rash.  Neurological: Negative.  Negative for headaches.  Hematological: Negative.  Does not bruise/bleed easily.  Psychiatric/Behavioral: Negative.  Negative for dysphoric mood. The patient is not nervous/anxious.        Objective:   Physical Exam Obese male in nad No skin breakdown or pressure necrosis from cpap mask Chest with basilar crackles, no wheezing Cor with what sounds like regular rhythm LE with 2+ edema bilat, no cyanosis  Alert, not sleepy, moves all 4        Assessment & Plan:

## 2011-04-26 NOTE — Patient Instructions (Signed)
Continue on symbicort for now Work on weight loss.  Discuss possible referral for bariatric surgery with your primary md You need to followup with your cardiologist Continue to weigh everyday, but should get concerned with a 0-4 pound shift rather than 0-10 pound shift followup with me in 4mos.

## 2011-04-30 ENCOUNTER — Encounter: Payer: Self-pay | Admitting: Pulmonary Disease

## 2011-04-30 NOTE — Assessment & Plan Note (Signed)
The patient has been doing well on bilevel therapy, and I have stressed again the need to be compliant and to work on weight reduction.

## 2011-04-30 NOTE — Assessment & Plan Note (Signed)
The patient has noticed some improvement in his breathing on symbicort, but his very mild obstructive disease is really the least of his worries.  His morbid obesity and deconditioning continue to be the primary issues that influence his breathing.

## 2011-05-11 ENCOUNTER — Encounter: Payer: Self-pay | Admitting: Cardiology

## 2011-05-11 ENCOUNTER — Ambulatory Visit (INDEPENDENT_AMBULATORY_CARE_PROVIDER_SITE_OTHER): Payer: Medicare Other | Admitting: Cardiology

## 2011-05-11 VITALS — BP 118/75 | HR 63 | Resp 20 | Ht 74.0 in | Wt 303.8 lb

## 2011-05-11 DIAGNOSIS — R0602 Shortness of breath: Secondary | ICD-10-CM

## 2011-05-11 DIAGNOSIS — E678 Other specified hyperalimentation: Secondary | ICD-10-CM

## 2011-05-11 DIAGNOSIS — I509 Heart failure, unspecified: Secondary | ICD-10-CM

## 2011-05-11 DIAGNOSIS — I5032 Chronic diastolic (congestive) heart failure: Secondary | ICD-10-CM

## 2011-05-11 DIAGNOSIS — I4891 Unspecified atrial fibrillation: Secondary | ICD-10-CM

## 2011-05-11 LAB — BASIC METABOLIC PANEL
CO2: 35 mEq/L — ABNORMAL HIGH (ref 19–32)
Calcium: 8.8 mg/dL (ref 8.4–10.5)
Creatinine, Ser: 1.5 mg/dL (ref 0.4–1.5)
GFR: 47.63 mL/min — ABNORMAL LOW (ref >60.00)
Sodium: 142 mEq/L (ref 135–145)

## 2011-05-11 LAB — BRAIN NATRIURETIC PEPTIDE: Pro B Natriuretic peptide (BNP): 54 pg/mL (ref 0.0–100.0)

## 2011-05-11 MED ORDER — POTASSIUM CHLORIDE CRYS ER 20 MEQ PO TBCR: 20.0000 meq | EXTENDED_RELEASE_TABLET | Freq: Every day | ORAL | Status: DC

## 2011-05-11 MED ORDER — FUROSEMIDE 40 MG PO TABS: ORAL_TABLET | ORAL | Status: DC

## 2011-05-11 NOTE — Assessment & Plan Note (Signed)
On BIPAP at night and oxygen with exertion.  I suspect that hypoxemia from OHS with pulmonary HTN has been a significant contributor to the patient's heart failure.  Echo in 9/11 showed moderate pulmonary hypertension and some RV dilation. I am going to repeat an echo now that patient is being well-treated for OHS to reassess PA pressure and RV function.

## 2011-05-11 NOTE — Assessment & Plan Note (Signed)
NYHA class III symptoms with some volume overload.  I am going to get a BMET and BNP today.  I will increase Lasix from 40 mg bid to 60 mg bid and repeat BMET/BNP in 2 weeks. I will increase KCl to 20 mEq daily.  Patient will followup in 2 months.

## 2011-05-11 NOTE — Assessment & Plan Note (Signed)
Chronic atrial fibrillation.  Good HR control on low dose of Toprol XL.  On warfarin for stroke prophylaxis.  

## 2011-05-11 NOTE — Patient Instructions (Signed)
Your physician recommends that you return for lab work in: today (BMP and BNP) and in 2 weeks (BMP and BNP) Diag: 427.2 and 428.32   Your physician has recommended you make the following change in your medication: Increase Lasix (furosemide) to 60mg  (one and one half 40mg  tabs)  twice daily and Potassium 20 meq once daily  Your physician has requested that you have an echocardiogram. Echocardiography is a painless test that uses sound waves to create images of your heart. It provides your doctor with information about the size and shape of your heart and how well your heart's chambers and valves are working. This procedure takes approximately one hour. There are no restrictions for this procedure.   Your physician recommends that you schedule a follow-up appointment in: 2 months

## 2011-05-11 NOTE — Progress Notes (Signed)
77 yo with h/o HTN, CVA, diabetes, obesity-hypoventilation syndrome, diastolic CHF, and chronic atrial fibrillation presents for cardiology followup.  Patient had a prolonged hospitalization in the fall of 2011 with severe sepsis.  He was also in rehab for a prolonged period of time after hospital discharge.  He is now home again.  He has OHS and uses BIPAP at night and oxygen with exertion during the day.  He has atrial fibrillation with reasonable rate control and is on coumadin.  He has CHF that I suspect is primarily right-sided from OHS with resulting pulmonary hypertension.  He is able to walk about 80-100 feet before getting short of breath and having to stop.  This is an improvement.  No chest pain. He is orthopneic.     ECG: atrial fibrillation at 63 with nonspecific T wave changes  Labs (2/10): creatinine 1.15, BNP 73, TSH normal   Allergies (verified):  1) ! Quinine   Past Medical History:  1. Atrial Fibrillation: apparently developed post-op right TKR in 2/10. Pt was started on coumadin. Now chronic.  2. Diabetes Type 2  3. Hyperlipidemia  4. Hypertension  5. Cerebrovascular Disease-CVA-2008  6. Obesity  7. Osteoarthritis left knee, s/p R TKR  8. History of thrombocytopenia  9. Diastolic CHF: Echo (9/11) with EF 60%, moderate LAE, mild RV dilation, PA systolic pressure 58 mmHg.  10. Obesity hypoventilation syndrome: BIPAP at night, O2 during the day with exertion. 11. Prolonged hospitalization in fall 2011 with Strep agalactiae bacteremia, MRSA PNA, and septic shock.   Family History:  Father: deceased MVA  Mother: deceased MVA   Social History:  Married  Tobacco Use - Former. -quit >35 years ago  3 children  Former [employer]  Review of Systems  negative except as per HPI  Current Outpatient Prescriptions  Medication Sig Dispense Refill  . acetaminophen (TYLENOL) 325 MG tablet Take 650 mg by mouth every 6 (six) hours as needed.        Marland Kitchen albuterol (VENTOLIN HFA)  108 (90 BASE) MCG/ACT inhaler Inhale 2 puffs into the lungs every 6 (six) hours as needed.        Marland Kitchen amLODipine (NORVASC) 5 MG tablet Take 5 mg by mouth daily.        . diphenhydrAMINE (BENADRYL) 25 mg capsule Take 25 mg by mouth at bedtime as needed.        Marland Kitchen HYDROcodone-acetaminophen (VICODIN) 5-500 MG per tablet Take 2 tablets by mouth every 6 (six) hours as needed.        . metoprolol succinate (TOPROL-XL) 25 MG 24 hr tablet Take 1/2 tab by mouth two times a day      . pantoprazole (PROTONIX) 40 MG tablet Take 1 tablet by mouth daily.      . simvastatin (ZOCOR) 20 MG tablet Take 20 mg by mouth at bedtime.        . SYMBICORT 160-4.5 MCG/ACT inhaler 2 puffs twice daily      . Tamsulosin HCl (FLOMAX) 0.4 MG CAPS Take 0.4 mg by mouth daily.        Marland Kitchen warfarin (COUMADIN) 3 MG tablet Take 1 1/2 tabs by mouth once a day       . DISCONTD: furosemide (LASIX) 40 MG tablet Take 40 mg by mouth daily.        Marland Kitchen DISCONTD: potassium chloride SA (K-DUR,KLOR-CON) 20 MEQ tablet Take 1/2 tablet daily       . furosemide (LASIX) 40 MG tablet Take one and one-half tabs  twice daily  90 tablet  6  . potassium chloride SA (K-DUR,KLOR-CON) 20 MEQ tablet Take 1 tablet (20 mEq total) by mouth daily.  30 tablet  6    BP 118/75  Pulse 63  Resp 20  Ht 6\' 2"  (1.88 m)  Wt 303 lb 12.8 oz (137.803 kg)  BMI 39.01 kg/m2 General: NAD, obese Neck: JVP 8 cm, no thyromegaly or thyroid nodule.  Lungs: Slight crackles at bases bilaterally.  CV: Nondisplaced PMI.  Heart irregular S1/S2, no S3/S4, no murmur.  2+ edema 1/2 up lower legs bilaterally.  No carotid bruit.  Unable to feel pedal pulses in setting of significant edema. Abdomen: Soft, nontender, no hepatosplenomegaly, no distention.  Neurologic: Alert and oriented x 3.  Psych: Normal affect. Extremities: No clubbing or cyanosis.

## 2011-05-16 ENCOUNTER — Telehealth: Payer: Self-pay | Admitting: *Deleted

## 2011-05-16 NOTE — Telephone Encounter (Signed)
Notes Recorded by Jacqlyn Krauss, RN on 05/16/2011 at 8:26 AM I talked with pt's wife. She is aware pt to decrease Lasix and KCL to ptiot doses of Lasix 40mg  bid and KCL to (1/2 of 20 mEq KCL) daily. She is aware pt should avoid NSAIDs. Pt scheduled for repeat BMP/BNP 05/24/11. Notes Recorded by [name], MD on 05/15/2011 at 11:35 PM Creatinine is up from prior. Would decrease Lasix and KCl back to prior doses (before I increased them at last appointment). Stay away from NSAIDs.

## 2011-05-24 ENCOUNTER — Ambulatory Visit (INDEPENDENT_AMBULATORY_CARE_PROVIDER_SITE_OTHER): Payer: Medicare Other | Admitting: *Deleted

## 2011-05-24 ENCOUNTER — Ambulatory Visit (HOSPITAL_COMMUNITY): Payer: Medicare Other | Attending: Cardiology | Admitting: Radiology

## 2011-05-24 DIAGNOSIS — I2789 Other specified pulmonary heart diseases: Secondary | ICD-10-CM

## 2011-05-24 DIAGNOSIS — I5032 Chronic diastolic (congestive) heart failure: Secondary | ICD-10-CM

## 2011-05-24 DIAGNOSIS — I509 Heart failure, unspecified: Secondary | ICD-10-CM | POA: Insufficient documentation

## 2011-05-24 DIAGNOSIS — E785 Hyperlipidemia, unspecified: Secondary | ICD-10-CM | POA: Insufficient documentation

## 2011-05-24 DIAGNOSIS — E669 Obesity, unspecified: Secondary | ICD-10-CM | POA: Insufficient documentation

## 2011-05-24 DIAGNOSIS — I079 Rheumatic tricuspid valve disease, unspecified: Secondary | ICD-10-CM | POA: Insufficient documentation

## 2011-05-24 DIAGNOSIS — I059 Rheumatic mitral valve disease, unspecified: Secondary | ICD-10-CM | POA: Insufficient documentation

## 2011-05-24 DIAGNOSIS — I10 Essential (primary) hypertension: Secondary | ICD-10-CM

## 2011-05-24 DIAGNOSIS — R0602 Shortness of breath: Secondary | ICD-10-CM

## 2011-05-24 DIAGNOSIS — E119 Type 2 diabetes mellitus without complications: Secondary | ICD-10-CM | POA: Insufficient documentation

## 2011-05-24 LAB — BASIC METABOLIC PANEL
CO2: 32 mEq/L (ref 19–32)
Calcium: 8.9 mg/dL (ref 8.4–10.5)
Creatinine, Ser: 1.4 mg/dL (ref 0.4–1.5)
GFR: 53.69 mL/min — ABNORMAL LOW (ref >60.00)
Sodium: 142 mEq/L (ref 135–145)

## 2011-05-24 LAB — BRAIN NATRIURETIC PEPTIDE: Pro B Natriuretic peptide (BNP): 90 pg/mL (ref 0.0–100.0)

## 2011-06-09 ENCOUNTER — Telehealth: Payer: Self-pay | Admitting: Pulmonary Disease

## 2011-06-09 MED ORDER — BUDESONIDE-FORMOTEROL FUMARATE 160-4.5 MCG/ACT IN AERO: 2.0000 | INHALATION_SPRAY | Freq: Two times a day (BID) | RESPIRATORY_TRACT | Status: DC

## 2011-06-09 NOTE — Telephone Encounter (Signed)
Called and spoke with pt's wife.  Informed her no samples avail.  Offered to call in rx to pharmacy. Wife would like rx called into [facility].  rx sent. Wife aware.

## 2011-06-19 IMAGING — CR DG CHEST 1V PORT
2 series · 2 of 2 positions shown · non-contrast
Comparison: 06/23/2010

CLINICAL DATA: Increased work of breathing.

PORTABLE CHEST - 1 VIEW

[AP (1 of 2)]
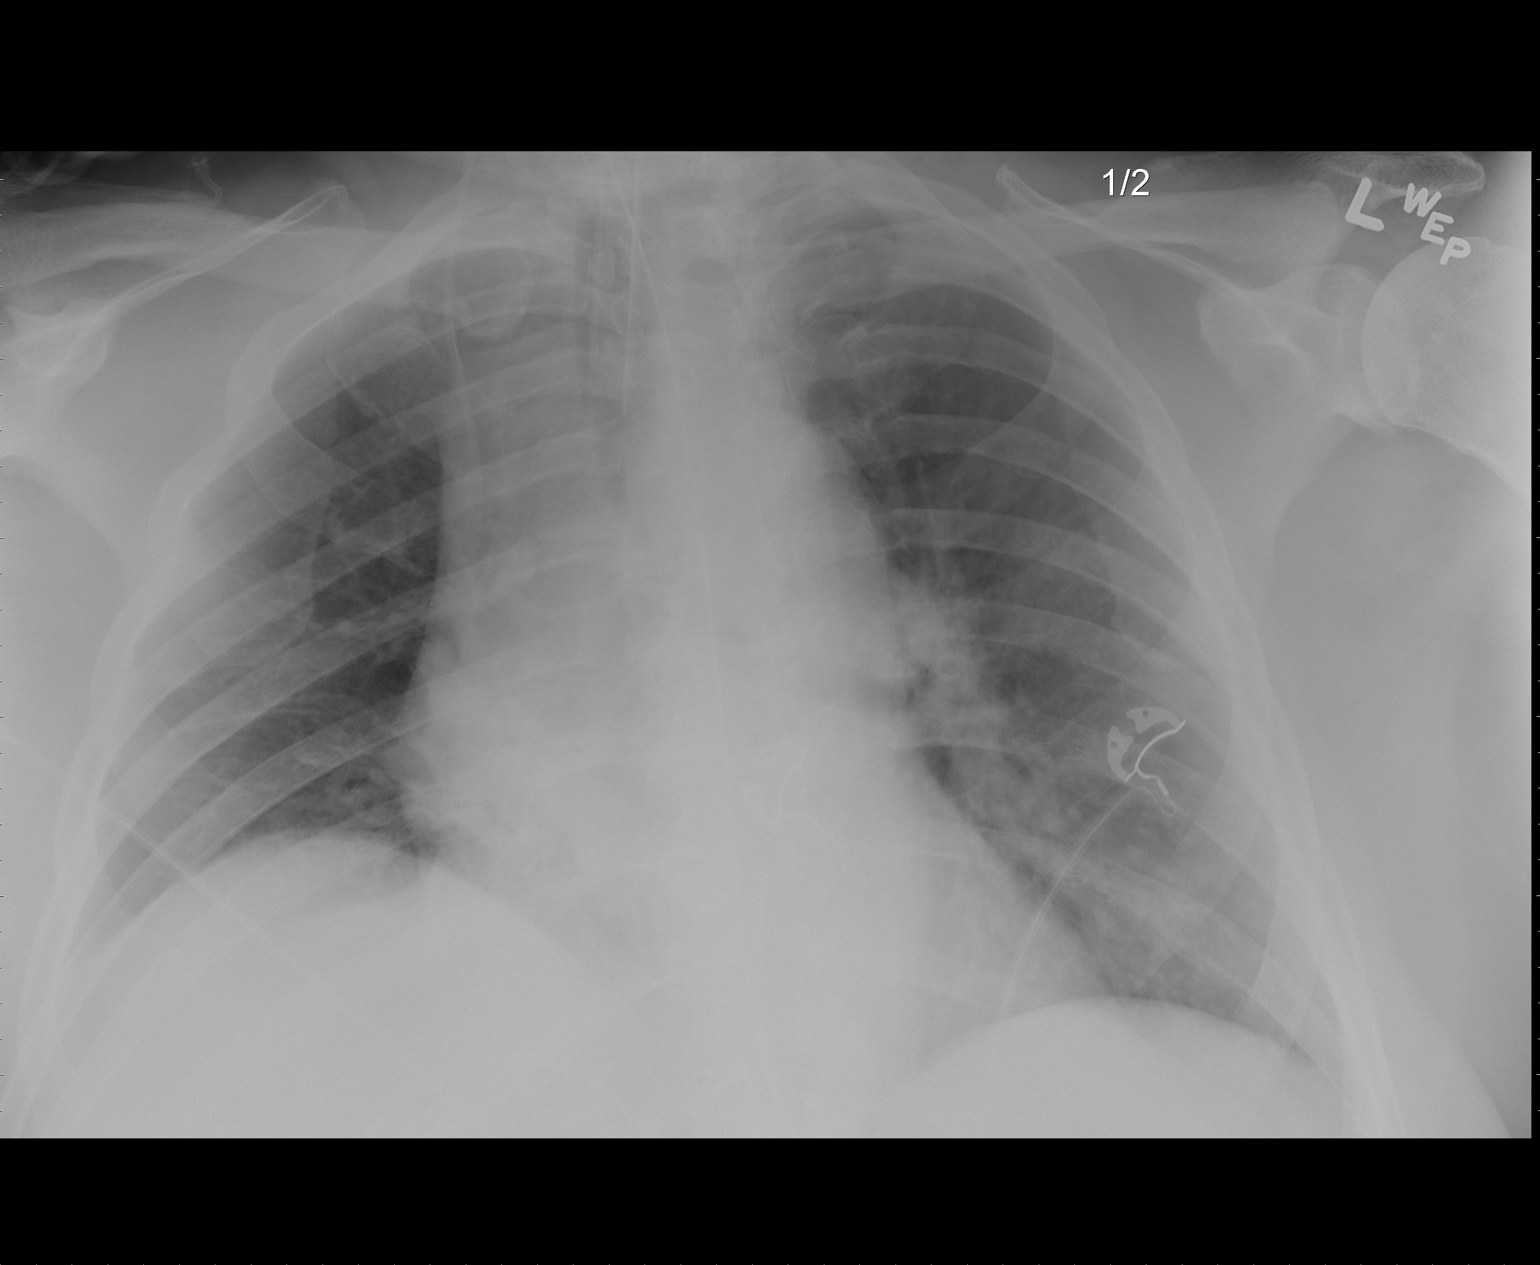

[AP (2 of 2)]
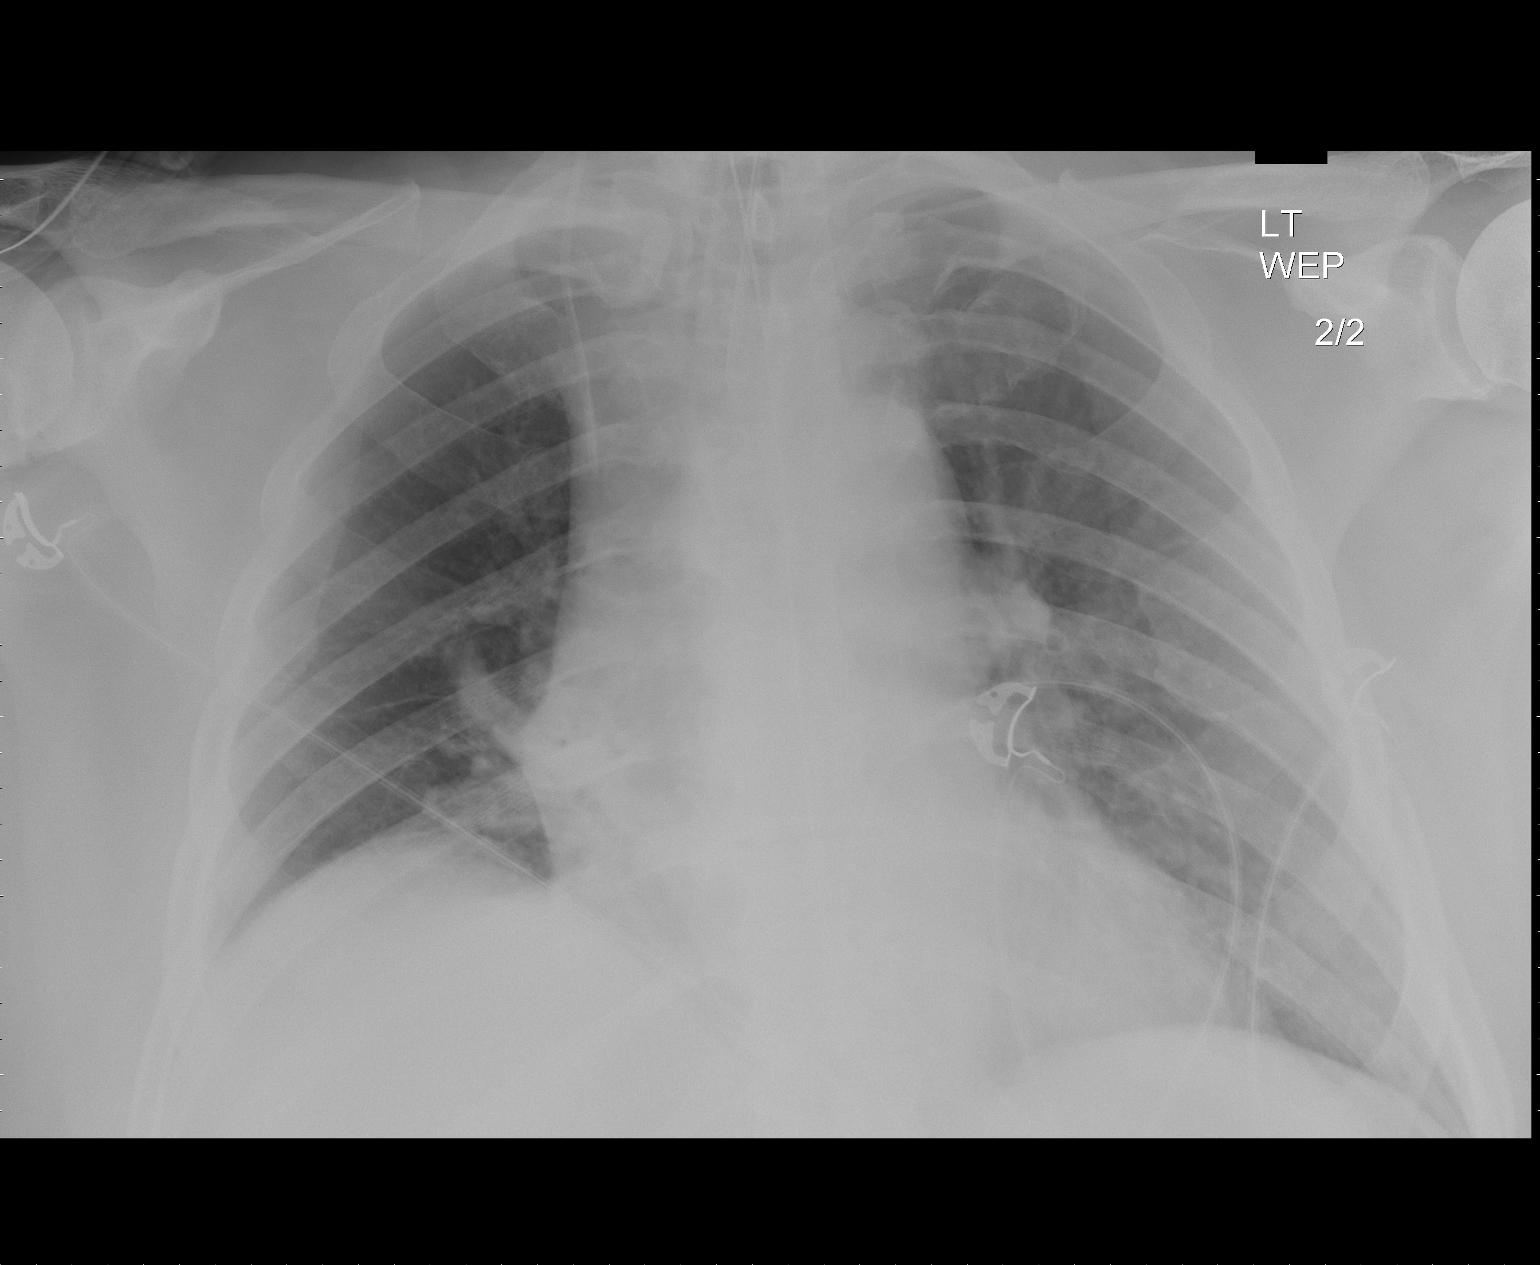

[2 of 2 positions shown; findings below may reference images not displayed]

FINDINGS: Endotracheal tube is in satisfactory position.
Nasogastric tube is followed into the stomach with the tip
projecting beyond the inferior boundary of the film.  Right IJ
central line tip projects near the junction of the brachiocephalic
veins or upper SVC.

Heart size stable.  Lungs are low in volume with bibasilar
atelectasis.
IMPRESSION: Low lung volumes with bibasilar atelectasis.

## 2011-06-20 IMAGING — CR DG CHEST 1V PORT
1 series · 1 of 1 positions shown · non-contrast
Comparison: 6566 hours

CLINICAL DATA: Sepsis/post tracheostomy placement

PORTABLE CHEST - 1 VIEW

[AP]
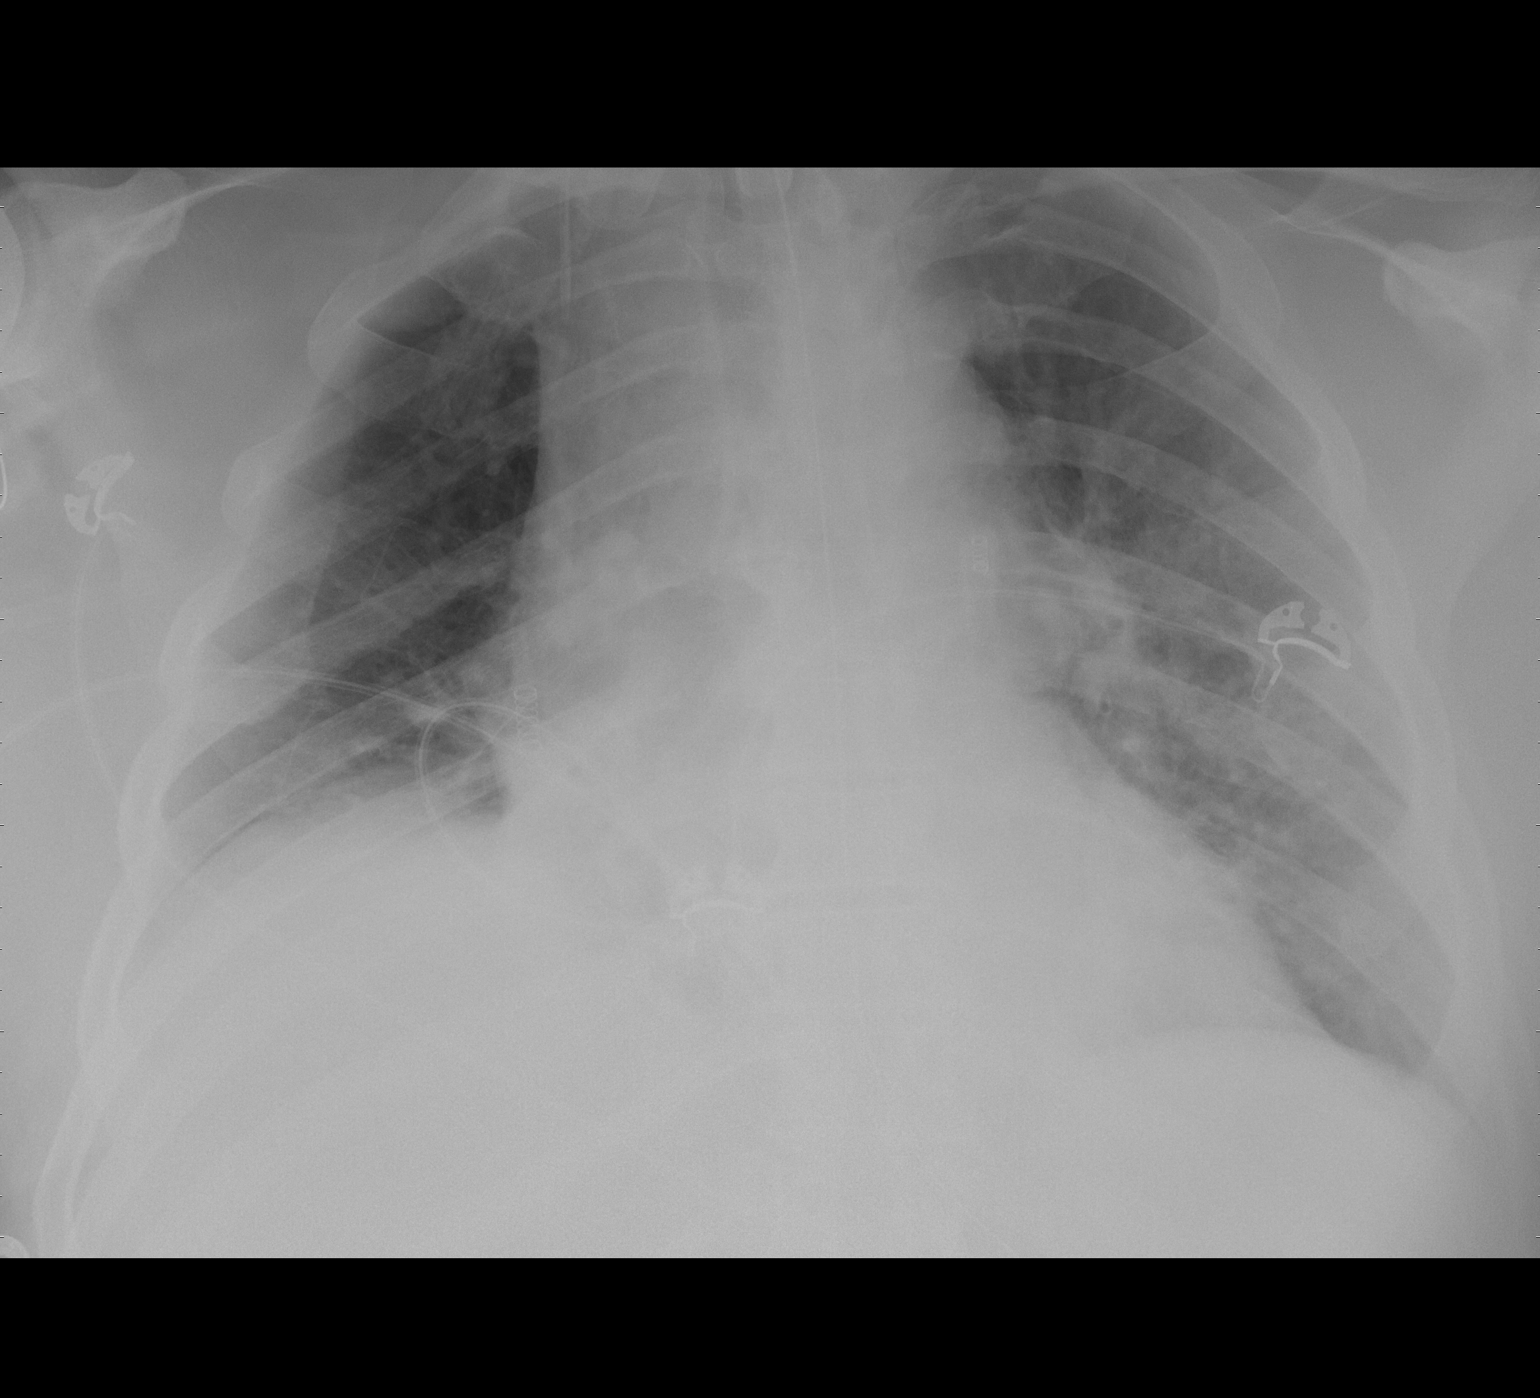

[1 of 1 positions shown; findings below may reference images not displayed]

FINDINGS: A tracheostomy has been placed.  It is in good position
in the AP plane.  No pneumothorax or pneumomediastinum.  There is a
right jugular central line in place with its tip in the proximal
SVC or brachiocephalic vein.  I do not see a PICC line as the
history suggests.  Heart and lungs unchanged.
IMPRESSION: 1.  Post tracheostomy placement with no immediate complications
evident in one-view.
2.  Right IJ central line in the proximal SVC or brachiocephalic
vein.
3.  No PICC line is identified.

## 2011-06-21 IMAGING — CR DG CHEST 1V PORT
1 series · 1 of 1 positions shown · non-contrast
Comparison: 06/24/2010.

CLINICAL DATA: Respiratory distress.

PORTABLE CHEST - 1 VIEW

[AP]
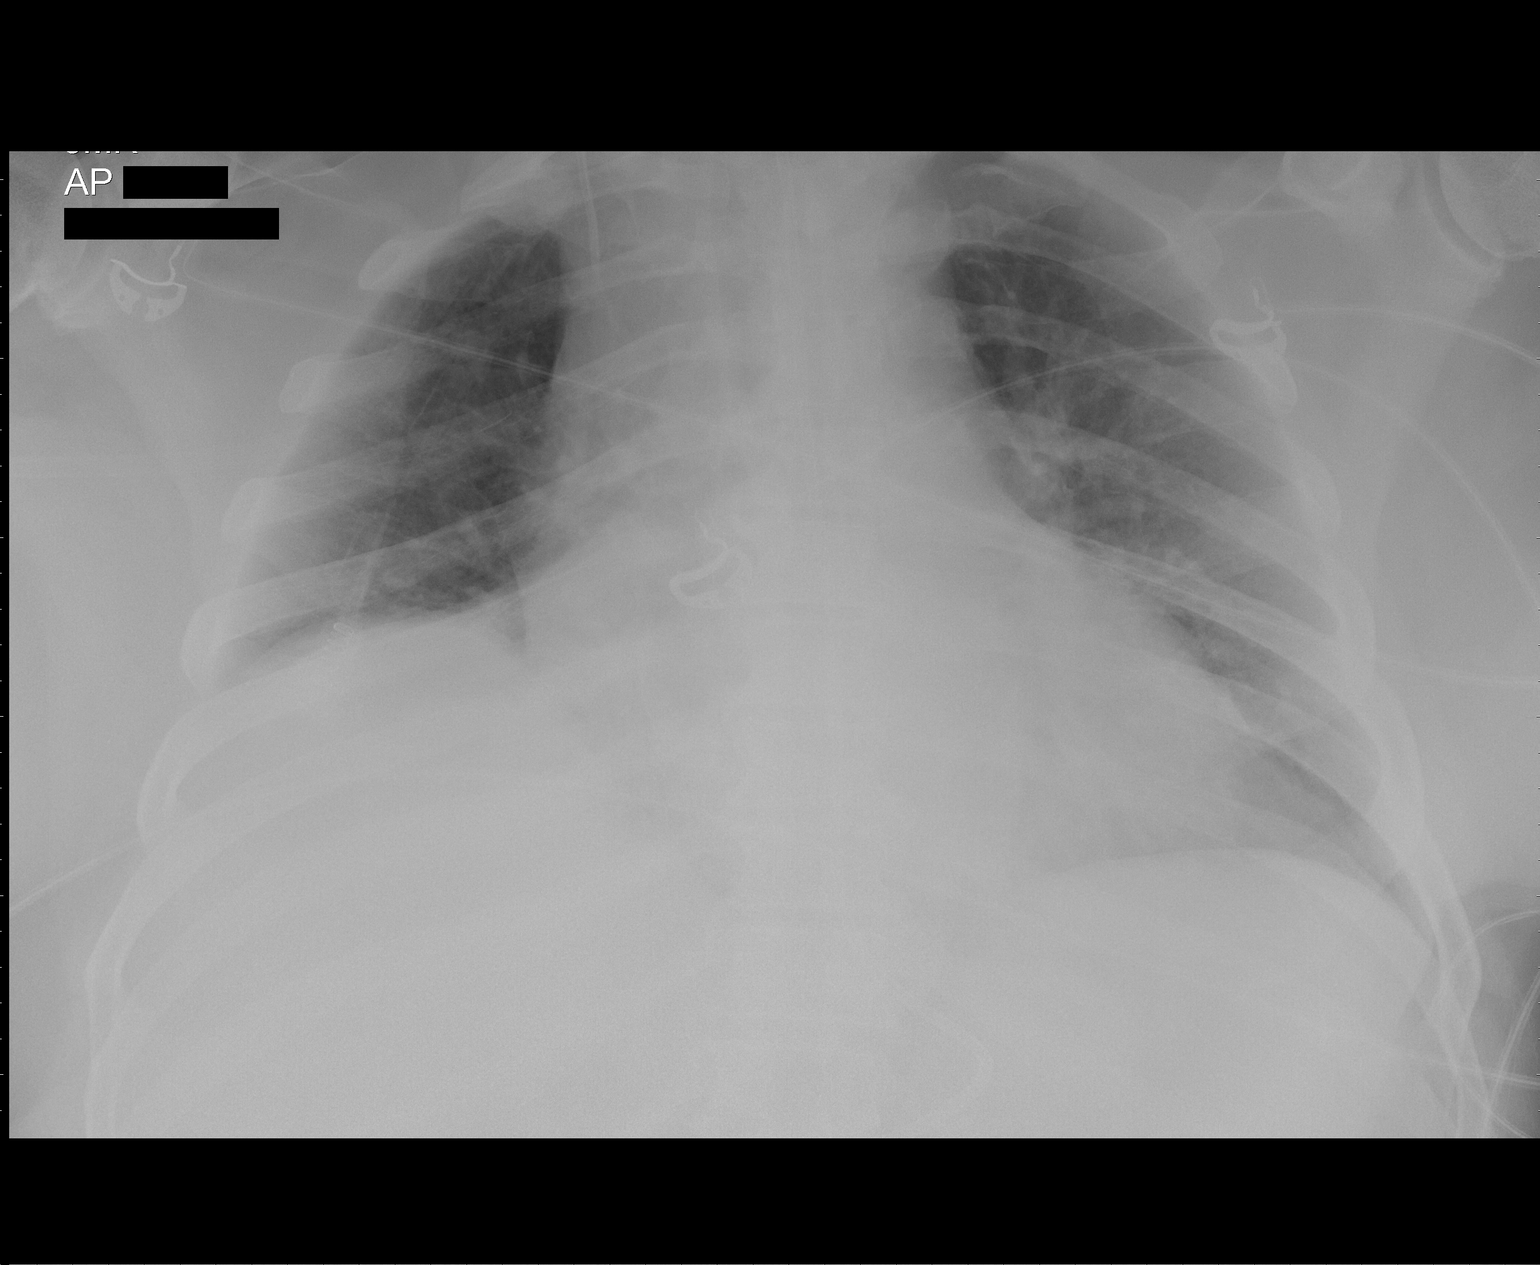

[1 of 1 positions shown; findings below may reference images not displayed]

FINDINGS: Tracheostomy is midline.  Right IJ central line tip
projects in the region of brachiocephalic vein junction.  Feeding
tube is followed into the stomach, with the tip projecting in the
region of the pylorus.

Heart size is stable.  Lungs are low in volume with interstitial
prominence.  Right hemidiaphragm is elevated.
IMPRESSION: Mild interstitial prominence may be due to edema.

## 2011-06-23 IMAGING — CR DG CHEST 1V PORT
1 series · 1 of 1 positions shown · non-contrast
Comparison: 06/25/2010

CLINICAL DATA: Sepsis, respiratory failure

PORTABLE CHEST - 1 VIEW

[view not recorded]
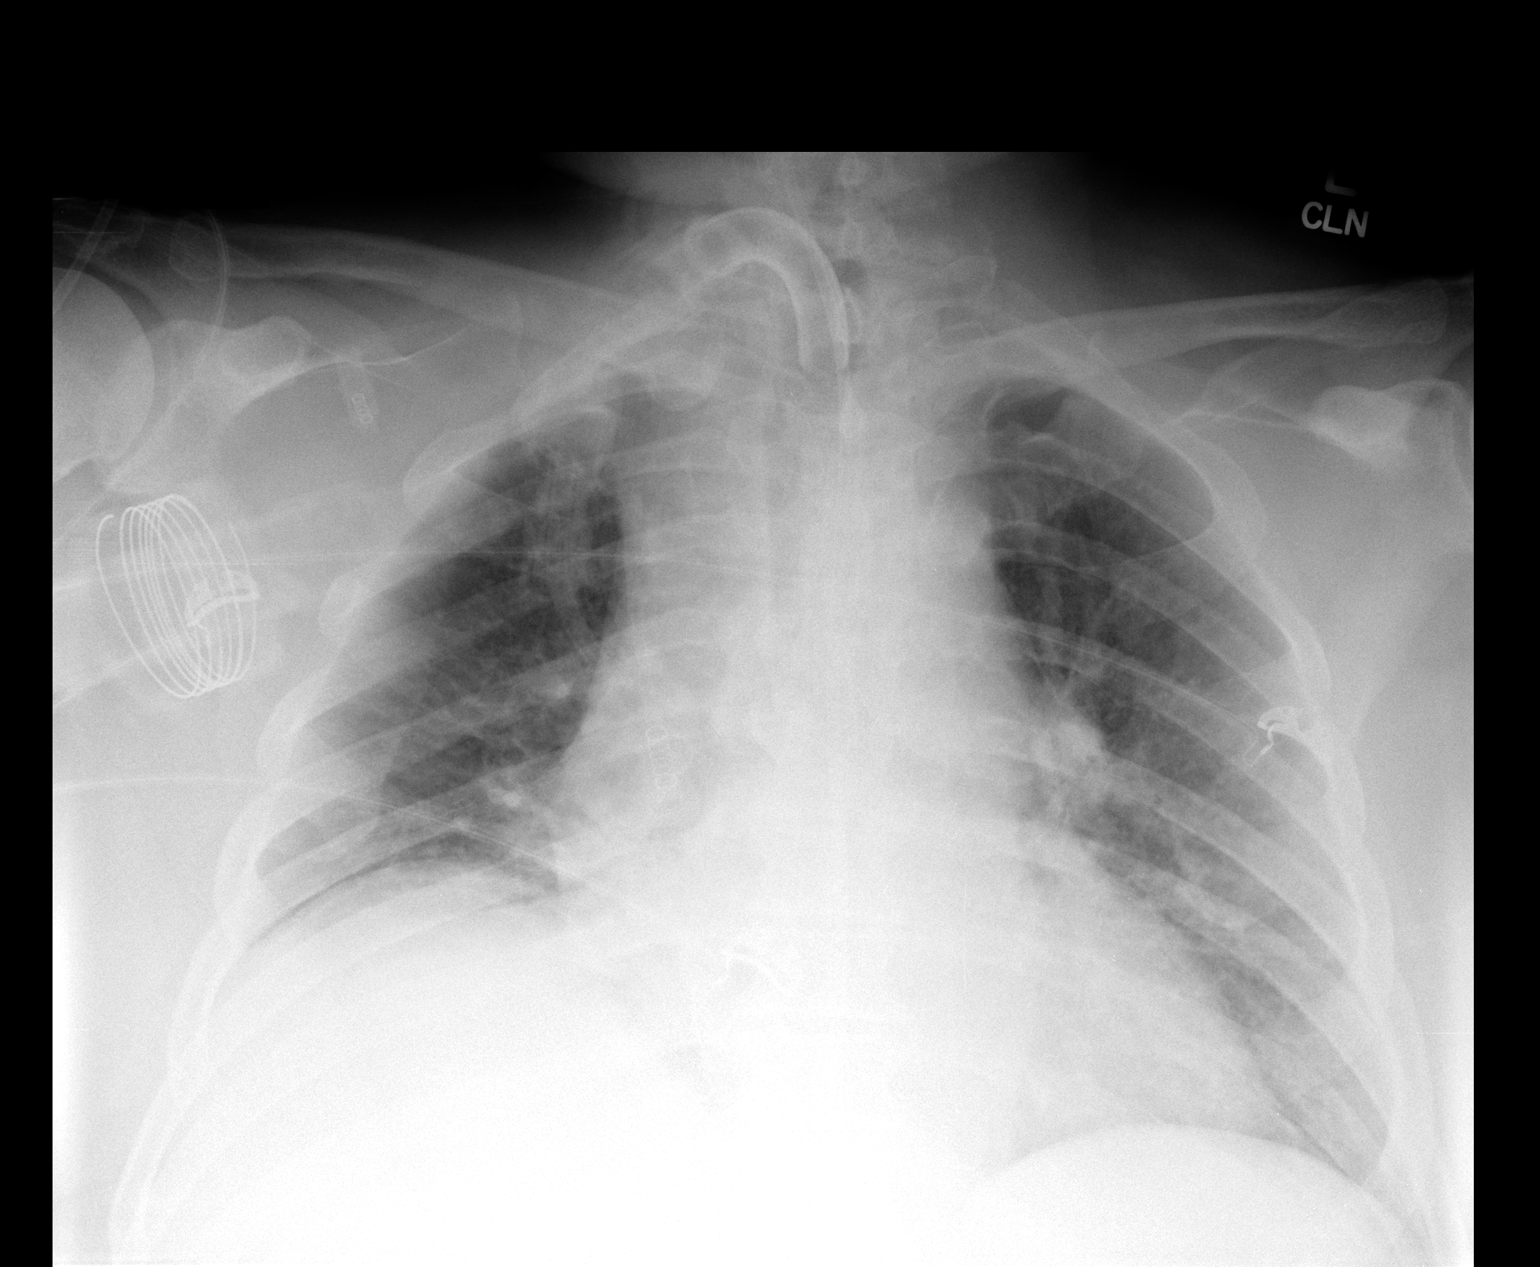

[1 of 1 positions shown; findings below may reference images not displayed]

FINDINGS: Right IJ central line removed.  Tracheostomy and feeding
tube remain.  Chronic elevation of the right hemidiaphragm.  Heart
remains enlarged with vascular congestion and basilar atelectasis.
No effusion or developing pneumothorax.  Stable exam.
IMPRESSION: Stable portable chest exam

## 2011-06-26 IMAGING — CR DG CHEST 1V PORT
1 series · 1 of 1 positions shown · non-contrast
Comparison: 06/27/2010.

CLINICAL DATA: New tracheostomy.

PORTABLE CHEST - 1 VIEW

[view not recorded]
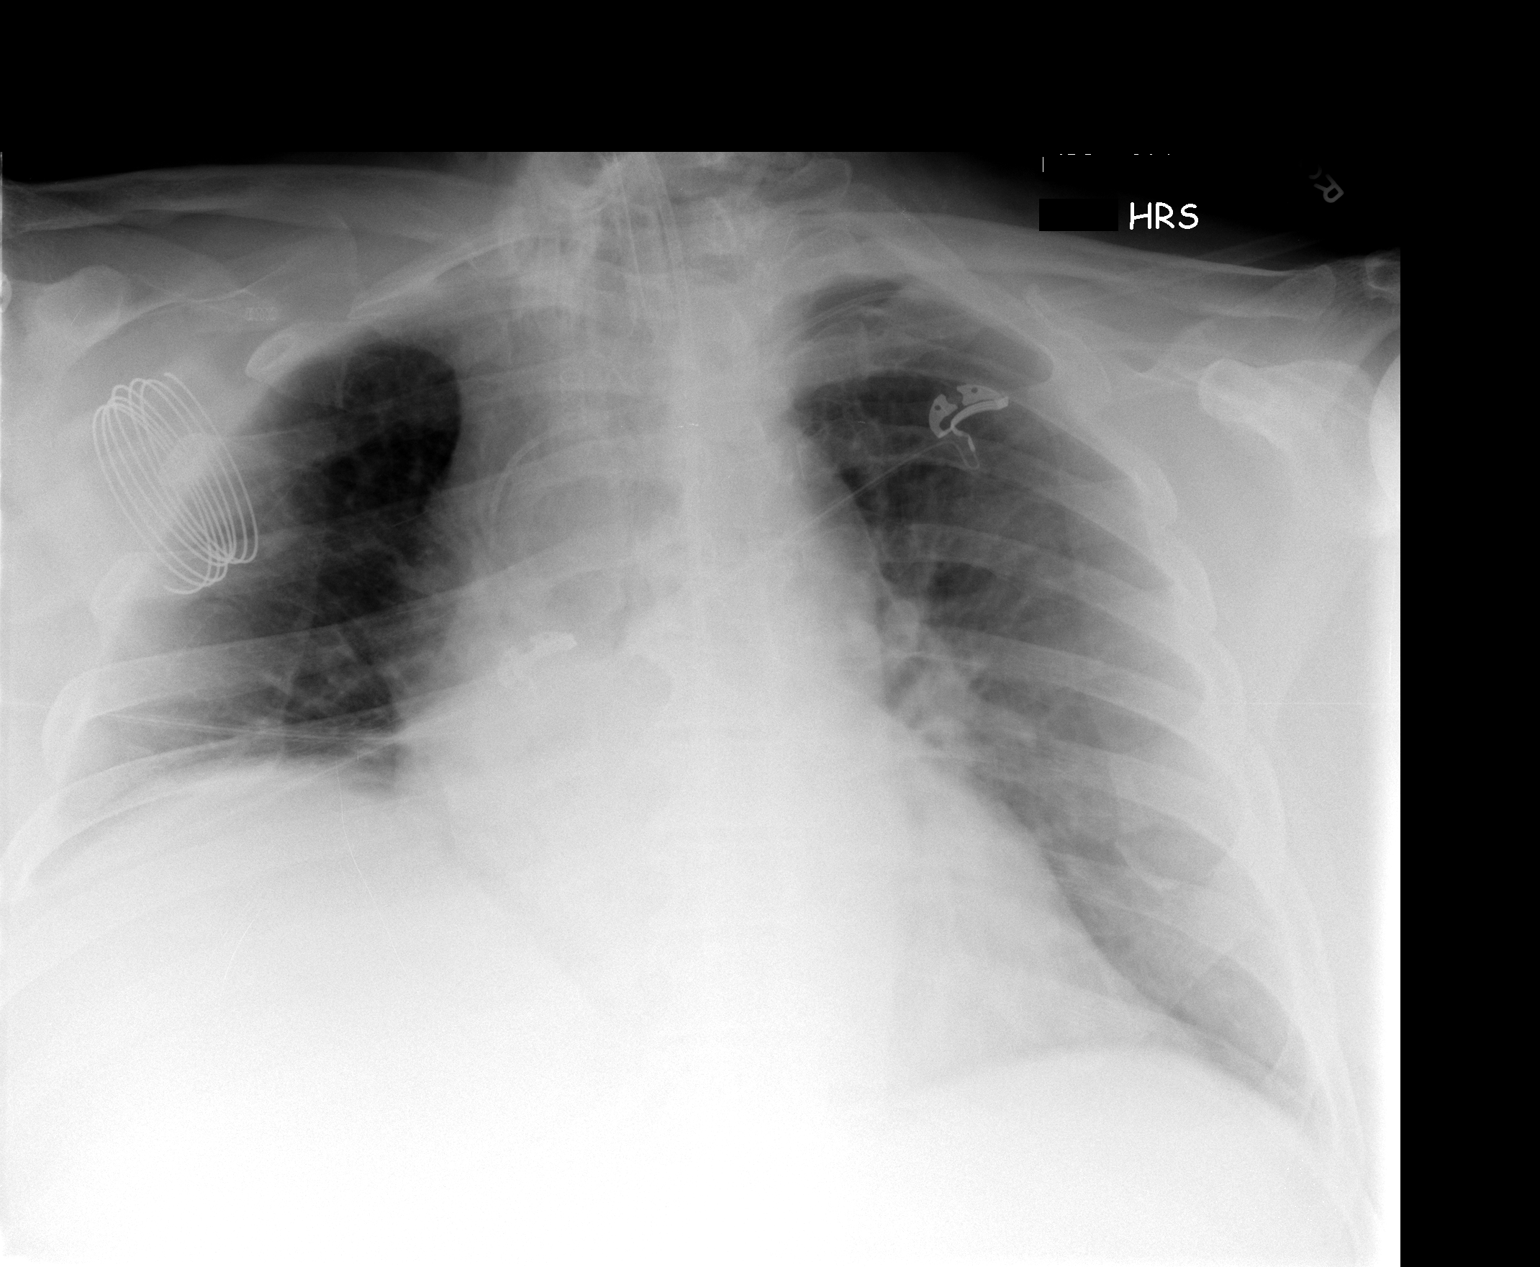

[1 of 1 positions shown; findings below may reference images not displayed]

FINDINGS: Tracheostomy tube is in place within the upper trachea.
Left central line tip proximal superior vena cava.  Elevated right
hemidiaphragm.  Right base subsegmental atelectasis.  Panda tube
courses below diaphragm.  Tip is not included on present exam.

Cardiomegaly.  Pulmonary vascular prominence.
IMPRESSION: Tracheostomy tube tip upper thoracic trachea.

Cardiomegaly and central pulmonary vessel prominence.

Elevated right hemidiaphragm with right base subsegmental
atelectasis.

## 2011-06-29 ENCOUNTER — Ambulatory Visit (INDEPENDENT_AMBULATORY_CARE_PROVIDER_SITE_OTHER): Payer: Medicare Other | Admitting: Pulmonary Disease

## 2011-06-29 ENCOUNTER — Encounter: Payer: Self-pay | Admitting: Pulmonary Disease

## 2011-06-29 DIAGNOSIS — J438 Other emphysema: Secondary | ICD-10-CM

## 2011-06-29 DIAGNOSIS — E678 Other specified hyperalimentation: Secondary | ICD-10-CM

## 2011-06-29 NOTE — Progress Notes (Signed)
  Subjective:    Patient ID: William Braun, male    DOB: 11/16/1932, 77 y.o.   MRN: [id number]  HPI The patient comes in today for followup of his known obesity hypoventilation syndrome and mild COPD.  He is wearing bilevel compliantly while sleeping, and feels he is doing well with the device.  He is also maintaining on symbicort, and has had no worsening pulmonary symptoms.  He is trying to work on weight loss, and feels that his conditioning has improved.   Review of Systems  Constitutional: Negative for fever and unexpected weight change.  HENT: Positive for rhinorrhea. Negative for ear pain, nosebleeds, congestion, sore throat, sneezing, trouble swallowing, dental problem, postnasal drip and sinus pressure.   Eyes: Positive for redness and itching.  Respiratory: Positive for cough and shortness of breath. Negative for chest tightness and wheezing.   Cardiovascular: Positive for leg swelling. Negative for palpitations.  Gastrointestinal: Negative for nausea and vomiting.  Genitourinary: Negative for dysuria.  Musculoskeletal: Negative for joint swelling.  Skin: Negative for rash.  Neurological: Negative for headaches.  Hematological: Bruises/bleeds easily.  Psychiatric/Behavioral: Negative for dysphoric mood. The patient is not nervous/anxious.        Objective:   Physical Exam Obese male in no acute distress No skin breakdown or pressure necrosis from the CPAP mask Chest with mild decreased breath sounds and mild basilar crackles, otherwise clear.  No wheezing noted. Cardiac exam with regular rate and rhythm Lower extremities with 1+ edema, no cyanosis noted Alert and oriented, moves all 4 extremities.       Assessment & Plan:

## 2011-06-29 NOTE — Assessment & Plan Note (Signed)
The patient is doing well from this standpoint, and is maintaining on symbicort currently.  Overall, he has mild disease, and this is only a small contributor to his overall breathing issue.

## 2011-06-29 NOTE — Patient Instructions (Signed)
Continue with current medications Stay on bilevel at night Work on weight loss and conditioning.  Will refer to pulmonary rehab at [facility].  followup with me in 6mos.

## 2011-06-29 NOTE — Assessment & Plan Note (Signed)
The patient is wearing bilevel compliantly, and and is having no issues with his mask fit or pressure tolerance.  He feels that he sleeps well with the device.  I have encouraged him to work aggressively on weight loss.

## 2011-07-19 ENCOUNTER — Encounter: Payer: Self-pay | Admitting: Cardiology

## 2011-07-19 ENCOUNTER — Ambulatory Visit (INDEPENDENT_AMBULATORY_CARE_PROVIDER_SITE_OTHER): Payer: Medicare Other | Admitting: Cardiology

## 2011-07-19 DIAGNOSIS — I5032 Chronic diastolic (congestive) heart failure: Secondary | ICD-10-CM

## 2011-07-19 DIAGNOSIS — I4891 Unspecified atrial fibrillation: Secondary | ICD-10-CM

## 2011-07-19 DIAGNOSIS — I509 Heart failure, unspecified: Secondary | ICD-10-CM

## 2011-07-19 DIAGNOSIS — R609 Edema, unspecified: Secondary | ICD-10-CM

## 2011-07-19 DIAGNOSIS — E678 Other specified hyperalimentation: Secondary | ICD-10-CM

## 2011-07-19 MED ORDER — FUROSEMIDE 40 MG PO TABS: ORAL_TABLET | ORAL | Status: DC

## 2011-07-19 MED ORDER — POTASSIUM CHLORIDE CRYS ER 20 MEQ PO TBCR: 20.0000 meq | EXTENDED_RELEASE_TABLET | Freq: Every day | ORAL | Status: DC

## 2011-07-19 NOTE — Patient Instructions (Addendum)
Your physician recommends that you schedule a follow-up appointment in: 1 month  Your physician recommends that you return for lab work in: 2 weeks--BMP, BNP--428.32, 782.3  Your physician has recommended you make the following change in your medication:        Increase furosemide to 60 mg by mouth twice daily.  This will be one and one half of the 40 mg tablets.      Increase potassium to 20 meq by mouth daily.

## 2011-07-20 NOTE — Assessment & Plan Note (Signed)
NYHA class IIIb symptoms with volume overload and weight gain.  He did not increase lasix when I saw him last.  I will increase Lasix from 40 mg bid to 60 mg bid and repeat BMET/BNP in 2 weeks. I will increase KCl to 20 mEq daily.  Patient will followup in 1 month.

## 2011-07-20 NOTE — Assessment & Plan Note (Signed)
On BIPAP at night and oxygen with exertion.  I suspect that hypoxemia from OHS with pulmonary HTN has been a significant contributor to the patient's heart failure.  Echo in 9/12 with moderate RV dysfunction.

## 2011-07-20 NOTE — Assessment & Plan Note (Signed)
Chronic atrial fibrillation.  Good HR control on low dose of Toprol XL.  On warfarin for stroke prophylaxis.

## 2011-07-20 NOTE — Progress Notes (Signed)
PCP: Dr. Earl Gala  77 yo with h/o HTN, CVA, diabetes, obesity-hypoventilation syndrome, diastolic CHF, and chronic atrial fibrillation presents for cardiology followup.  Patient had a prolonged hospitalization in the fall of 2011 with severe sepsis.  He was also in rehab for a prolonged period of time after hospital discharge.  He is now home again.  He has OHS and uses BIPAP at night and oxygen with exertion during the day.  He has atrial fibrillation with reasonable rate control and is on coumadin.  He has CHF that I suspect is primarily right-sided from OHS with resulting pulmonary hypertension.  He is able to walk about 50 feet before getting short of breath and having to stop, this is somewhat worse.  No chest pain. He is orthopneic.  At last appointment, I had asked him to increase Lasix to 60 mg bid because of volume overload.  He is, however, still taking Lasix 40 mg bid.  Weight is up 9 lbs.  Echo was done showed EF 55%, moderately dilated RV with moderate RV systolic dysfunction, PA systolic pressure 36 mmHg.    Labs (2/10): creatinine 1.15, BNP 73, TSH normal  Labs (9/12): K 4.1, creatinine 1.4, BNP 90  Allergies (verified):  1) ! Quinine   Past Medical History:  1. Atrial Fibrillation: apparently developed post-op right TKR in 2/10. Pt was started on coumadin. Now chronic.  2. Diabetes Type 2  3. Hyperlipidemia  4. Hypertension  5. Cerebrovascular Disease-CVA-2008  6. Obesity  7. Osteoarthritis left knee, s/p R TKR  8. History of thrombocytopenia  9. Diastolic CHF: Echo (9/12) with EF 55%, moderately dilated RV with moderate RV systolic dysfunction, PA systolic pressure 36 mmHg.  10. Obesity hypoventilation syndrome: BIPAP at night, O2 during the day with exertion. 11. Prolonged hospitalization in fall 2011 with Strep agalactiae bacteremia, MRSA PNA, and septic shock.   Family History:  Father: deceased MVA  Mother: deceased MVA   Social History:  Married  Tobacco Use -  Former. -quit >35 years ago  3 children  Former [employer]  Review of Systems  negative except as per HPI  Current Outpatient Prescriptions  Medication Sig Dispense Refill  . acetaminophen (TYLENOL) 325 MG tablet Take 650 mg by mouth every 6 (six) hours as needed.        Marland Kitchen albuterol (VENTOLIN HFA) 108 (90 BASE) MCG/ACT inhaler Inhale 2 puffs into the lungs every 6 (six) hours as needed.        Marland Kitchen amLODipine (NORVASC) 5 MG tablet Take 5 mg by mouth daily.        . budesonide-formoterol (SYMBICORT) 160-4.5 MCG/ACT inhaler Inhale 2 puffs into the lungs 2 (two) times daily. 2 puffs twice daily  1 Inhaler  6  . diphenhydrAMINE (BENADRYL) 25 mg capsule Take 25 mg by mouth at bedtime as needed.        Marland Kitchen HYDROcodone-acetaminophen (VICODIN) 5-500 MG per tablet Take 2 tablets by mouth every 6 (six) hours as needed.        . metoprolol succinate (TOPROL-XL) 25 MG 24 hr tablet Take 1/2 tab by mouth two times a day      . pantoprazole (PROTONIX) 40 MG tablet Take 1 tablet by mouth daily.      . simvastatin (ZOCOR) 20 MG tablet Take 20 mg by mouth at bedtime.        Marland Kitchen warfarin (COUMADIN) 3 MG tablet Take 1 1/2 tabs by mouth once a day or as directed.      Marland Kitchen  furosemide (LASIX) 40 MG tablet Take one and one half tablets by mouth twice daily  90 tablet  6  . potassium chloride SA (K-DUR,KLOR-CON) 20 MEQ tablet Take 1 tablet (20 mEq total) by mouth daily.  30 tablet  6    BP 136/68  Pulse 88  Ht 6\' 2"  (1.88 m)  Wt 314 lb (142.429 kg)  BMI 40.32 kg/m2 General: NAD, obese Neck: JVP 9-10 cm, no thyromegaly or thyroid nodule.  Lungs: Slight crackles at bases bilaterally.  CV: Nondisplaced PMI.  Heart irregular S1/S2, no S3/S4, 1/6 SEM. 1+ edema 3/4 up lower legs bilaterally.  No carotid bruit.  Unable to feel pedal pulses in setting of significant edema. Abdomen: Soft, nontender, no hepatosplenomegaly, no distention.  Neurologic: Alert and oriented x 3.  Psych: Normal affect. Extremities: No clubbing  or cyanosis.

## 2011-08-02 ENCOUNTER — Other Ambulatory Visit: Payer: Medicare Other | Admitting: *Deleted

## 2011-08-02 ENCOUNTER — Telehealth: Payer: Self-pay | Admitting: Cardiology

## 2011-08-02 NOTE — Telephone Encounter (Signed)
Spoke with patient and he denies any pain but states he is just having hard time breathing.  Did increase his Lasix to 60 mg twice daily as advised at last visit.  Did say swelling in ankles and feet going down some.

## 2011-08-02 NOTE — Telephone Encounter (Signed)
Spoke with Dr Shirlee Latch and will have patient increase Lasix to 80 mg x 2 doses and scheduled appointment with Tereso Newcomer PA for tomorrow.  Advised if worse to go to emergency department.  Verbalized understanding.

## 2011-08-02 NOTE — Telephone Encounter (Signed)
Pt having SOB, for two days now, worse today, wants to see dr Shirlee Latch, has blood work at 11a, told to  wait until hears from nurse and can do blood work later if has appt or not today

## 2011-08-03 ENCOUNTER — Encounter: Payer: Self-pay | Admitting: Physician Assistant

## 2011-08-03 ENCOUNTER — Ambulatory Visit (INDEPENDENT_AMBULATORY_CARE_PROVIDER_SITE_OTHER): Payer: Medicare Other | Admitting: Physician Assistant

## 2011-08-03 ENCOUNTER — Other Ambulatory Visit: Payer: Medicare Other | Admitting: *Deleted

## 2011-08-03 DIAGNOSIS — I10 Essential (primary) hypertension: Secondary | ICD-10-CM

## 2011-08-03 DIAGNOSIS — R0989 Other specified symptoms and signs involving the circulatory and respiratory systems: Secondary | ICD-10-CM

## 2011-08-03 DIAGNOSIS — R079 Chest pain, unspecified: Secondary | ICD-10-CM

## 2011-08-03 DIAGNOSIS — I509 Heart failure, unspecified: Secondary | ICD-10-CM

## 2011-08-03 DIAGNOSIS — I4891 Unspecified atrial fibrillation: Secondary | ICD-10-CM

## 2011-08-03 DIAGNOSIS — I5032 Chronic diastolic (congestive) heart failure: Secondary | ICD-10-CM

## 2011-08-03 LAB — BASIC METABOLIC PANEL
BUN: 32 mg/dL — ABNORMAL HIGH (ref 6–23)
CO2: 29 mEq/L (ref 19–32)
Calcium: 8.9 mg/dL (ref 8.4–10.5)
Chloride: 96 mEq/L (ref 96–112)
Creatinine, Ser: 1.9 mg/dL — ABNORMAL HIGH (ref 0.4–1.5)
Glucose, Bld: 224 mg/dL — ABNORMAL HIGH (ref 70–99)

## 2011-08-03 MED ORDER — NITROGLYCERIN 0.4 MG SL SUBL: 0.4000 mg | SUBLINGUAL_TABLET | SUBLINGUAL | Status: AC | PRN

## 2011-08-03 NOTE — Assessment & Plan Note (Signed)
Controlled.  Continue current therapy.  

## 2011-08-03 NOTE — Assessment & Plan Note (Signed)
Rate controlled.  He remains on coumadin.

## 2011-08-03 NOTE — Assessment & Plan Note (Signed)
Increased dyspnea improved with extra lasix.  He cannot weigh at home.  Weights here are down 14 pounds.  Continue Lasix 60 bid.  Check bmet and bnp today.  He will call if dyspnea increases again.  At that point will likely need to keep on higher dose lasix with close attention to his renal fxn and K+.

## 2011-08-03 NOTE — Progress Notes (Signed)
History of Present Illness: PCP:  Dr. Earl Gala Primary Cardiologist:  Dr. [name]   William Braun is a 77 y.o. male with a h/o HTN, CVA, diabetes, obesity-hypoventilation syndrome, diastolic CHF, and chronic atrial fibrillation.  Patient had a prolonged hospitalization in the fall of 2011 with severe sepsis.  He was also in rehab for a prolonged period of time after hospital discharge.  He is now home again.  He has OHS and uses BIPAP at night and oxygen with exertion during the day.  He has atrial fibrillation with reasonable rate control and is on coumadin.  He has CHF that is suspected to be primarily right-sided from OHS with resulting pulmonary hypertension.  Last saw Dr. [name] 10/31.  At prior visit he had been asked to increase Lasix to 60 mg bid because of volume overload.  However, he was still taking Lasix 40 mg bid.  Weight was up 9 lbs.  Echo was done that showed EF 55%, moderately dilated RV with moderate RV systolic dysfunction, PA systolic pressure 36 mmHg.    He called in yesterday with increased dyspnea.  Lasix was 60 mg BID and he was told to take 80 mg BID x 2 doses and see me today.  He does not have a scale at home and has not been able to monitor his weights.  He describes Class3-3b DOE chronically.  It had been slightly improved and his LE edema was also better.  But he noted increased dyspnea yesterday.  Feels better today with the extra lasix yesterday.  Has noted left sided chest pain off and on last few days.  He does not really exert himself that much but does not have CP with activity.  Pain is sharp.  Last had this am.  Noted after awakening.  Lasted about 1-2 hours.  Resolved before coming.  No associated dyspnea, arm or jaw symptoms, nausea or diaphoresis.  No syncope.    Labs (2/10): creatinine 1.15, BNP 73, TSH normal  Labs (9/12): K 4.1, creatinine 1.4, BNP 90  Past Medical History:  1. Atrial Fibrillation: apparently developed post-op right TKR in 2/10. Pt  was started on coumadin. Now chronic.  2. Diabetes Type 2  3. Hyperlipidemia  4. Hypertension  5. Cerebrovascular Disease-CVA-2008  6. Obesity  7. Osteoarthritis left knee, s/p R TKR  8. History of thrombocytopenia  9. Diastolic CHF: Echo (9/12) with EF 55%, moderately dilated RV with moderate RV systolic dysfunction, PA systolic pressure 36 mmHg.  10. Obesity hypoventilation syndrome: BIPAP at night, O2 during the day with exertion. 11. Prolonged hospitalization in fall 2011 with Strep agalactiae bacteremia, MRSA PNA, and septic shock.    Current Outpatient Prescriptions  Medication Sig Dispense Refill  . acetaminophen (TYLENOL) 325 MG tablet Take 650 mg by mouth every 6 (six) hours as needed.        Marland Kitchen albuterol (VENTOLIN HFA) 108 (90 BASE) MCG/ACT inhaler Inhale 2 puffs into the lungs every 6 (six) hours as needed.        Marland Kitchen amLODipine (NORVASC) 5 MG tablet Take 5 mg by mouth daily.        . budesonide-formoterol (SYMBICORT) 160-4.5 MCG/ACT inhaler Inhale 2 puffs into the lungs 2 (two) times daily. 2 puffs twice daily  1 Inhaler  6  . diphenhydrAMINE (BENADRYL) 25 mg capsule Take 25 mg by mouth at bedtime as needed.        . furosemide (LASIX) 40 MG tablet Take one and one half tablets  by mouth twice daily  90 tablet  6  . HYDROcodone-acetaminophen (VICODIN) 5-500 MG per tablet Take 2 tablets by mouth every 6 (six) hours as needed.        . metFORMIN (GLUCOPHAGE) 500 MG tablet 1 tab twice a day      . metoprolol succinate (TOPROL-XL) 25 MG 24 hr tablet Take 1/2 tab by mouth two times a day      . pantoprazole (PROTONIX) 40 MG tablet Take 1 tablet by mouth daily.      . potassium chloride SA (K-DUR,KLOR-CON) 20 MEQ tablet Take 1 tablet (20 mEq total) by mouth daily.  30 tablet  6  . simvastatin (ZOCOR) 20 MG tablet Take 20 mg by mouth at bedtime.        Marland Kitchen warfarin (COUMADIN) 3 MG tablet Take 1 1/2 tabs by mouth once a day or as directed.        Allergies: Allergies  Allergen Reactions   . Quinine     History  Substance Use Topics  . Smoking status: Former Smoker -- 2.0 packs/day for 34 years    Types: Cigarettes    Quit date: 09/19/1980  . Smokeless tobacco: Not on file  . Alcohol Use: Not on file     ROS:  Please see the history of present illness.   ENT ROS: positive for - nasal discharge.  Cardiovascular ROS: negative for - pleuritic chest pain.  Gastrointestinal ROS: negative for - gas/bloating, heartburn or swallowing difficulty/pain.  All other systems reviewed and negative.   Vital Signs: BP 134/73  Pulse 90  Ht 6\' 2"  (1.88 m)  Wt 300 lb (136.079 kg)  BMI 38.52 kg/m2  PHYSICAL EXAM: Chronically ill appearing male in a wheelchair in no acute distress HEENT: normal Neck: no JVD at 90 degrees Cardiac:  normal S1, S2; irreg irreg; 1/6 systolic murmur Lungs:  Coarse breath sounds at the bases bilaterally, no wheezing, rhonchi Abd: soft, nontender, no hepatomegaly Ext: trace bilateral edema with chronic changes Skin: warm and dry Neuro:  CNs 2-12 intact, no focal abnormalities noted  EKG:   Afib, HR 88, leftward axis, NSSTTW changes  ASSESSMENT AND PLAN:

## 2011-08-03 NOTE — Assessment & Plan Note (Signed)
Somewhat atypical.  He does have risk equivalent of DM2.  I cannot tell that he has had a perfusion study in the past.  Will arrange Lexiscan myoview.  Keep follow up with Dr. [name] 11/27.  If myoview is normal, reschedule follow up for 2 mos.  If abnormal, will need to discuss +/- LHC.  Give rx for NTG to use PRN.  He knows to go to ED if pain changes or worse.

## 2011-08-03 NOTE — Patient Instructions (Signed)
Your physician recommends that you schedule a follow-up appointment in: KEEP YOUR SCHEDULED APPOINTMENT THAT YOU ALREADY HAVE WITH DR. Shirlee Latch.  Your physician has requested that you have a lexiscan myoview DX [phone] CHEST PAIN. For further information please visit [email or web address] Please follow instruction sheet, as given.  Your physician has recommended you make the following change in your medication: YOU CAN RESUME YOUR PREVIOUS DOSE OF LASIX 60 MG TWICE DAILY; A NEW PRESCRIPTION HAS BEEN SENT IN FOR YOU FOR NITROGLYCERIN.  YOU HAVE BEEN INSTRUCTED TO USE THE NITROGLYCERIN IF YOU HAVE CHEST PAIN AGAIN AND CALL us TO LET us KNOW IF THIS HELPS OR DOES NOT HELP, IF YOU HAVE CHEST PAIN THAT MAKES YOU FEEL BAD AND FEELS WORSE THAN OTHER TIMES YOU HAVE BEEN INSTRUCTED TO GO TO THE EMERGENCY DEPT PER SCOTT WEAVER, PA-C.  Your physician recommends that you return for lab work in: TODAY BMET, BNP 428.32 HEART FAILURE, 427.31 AFIB

## 2011-08-04 NOTE — Progress Notes (Signed)
Addended by: Tarri Fuller on: 08/04/2011 08:53 AM   Modules accepted: Orders

## 2011-08-04 NOTE — Progress Notes (Signed)
Addended by: Tarri Fuller on: 08/04/2011 08:50 AM   Modules accepted: Orders

## 2011-08-09 ENCOUNTER — Ambulatory Visit (HOSPITAL_COMMUNITY): Payer: Medicare Other | Attending: Cardiology | Admitting: Radiology

## 2011-08-09 DIAGNOSIS — R079 Chest pain, unspecified: Secondary | ICD-10-CM | POA: Insufficient documentation

## 2011-08-09 DIAGNOSIS — I4891 Unspecified atrial fibrillation: Secondary | ICD-10-CM

## 2011-08-09 DIAGNOSIS — J4489 Other specified chronic obstructive pulmonary disease: Secondary | ICD-10-CM | POA: Insufficient documentation

## 2011-08-09 DIAGNOSIS — R0609 Other forms of dyspnea: Secondary | ICD-10-CM | POA: Insufficient documentation

## 2011-08-09 DIAGNOSIS — R0989 Other specified symptoms and signs involving the circulatory and respiratory systems: Secondary | ICD-10-CM | POA: Insufficient documentation

## 2011-08-09 DIAGNOSIS — R9431 Abnormal electrocardiogram [ECG] [EKG]: Secondary | ICD-10-CM | POA: Insufficient documentation

## 2011-08-09 DIAGNOSIS — Z8679 Personal history of other diseases of the circulatory system: Secondary | ICD-10-CM | POA: Insufficient documentation

## 2011-08-09 DIAGNOSIS — R0602 Shortness of breath: Secondary | ICD-10-CM | POA: Insufficient documentation

## 2011-08-09 DIAGNOSIS — I10 Essential (primary) hypertension: Secondary | ICD-10-CM | POA: Insufficient documentation

## 2011-08-09 DIAGNOSIS — R002 Palpitations: Secondary | ICD-10-CM | POA: Insufficient documentation

## 2011-08-09 DIAGNOSIS — E119 Type 2 diabetes mellitus without complications: Secondary | ICD-10-CM | POA: Insufficient documentation

## 2011-08-09 DIAGNOSIS — I5032 Chronic diastolic (congestive) heart failure: Secondary | ICD-10-CM

## 2011-08-09 DIAGNOSIS — Z87891 Personal history of nicotine dependence: Secondary | ICD-10-CM | POA: Insufficient documentation

## 2011-08-09 DIAGNOSIS — J449 Chronic obstructive pulmonary disease, unspecified: Secondary | ICD-10-CM | POA: Insufficient documentation

## 2011-08-09 DIAGNOSIS — I5030 Unspecified diastolic (congestive) heart failure: Secondary | ICD-10-CM | POA: Insufficient documentation

## 2011-08-09 MED ORDER — REGADENOSON 0.4 MG/5ML IV SOLN
0.4000 mg | Freq: Once | INTRAVENOUS | Status: AC
  Administered 2011-08-09: 0.4 mg via INTRAVENOUS

## 2011-08-09 MED ORDER — TECHNETIUM TC 99M TETROFOSMIN IV KIT
33.0000 | PACK | Freq: Once | INTRAVENOUS | Status: AC | PRN
  Administered 2011-08-09: 33 via INTRAVENOUS

## 2011-08-09 NOTE — Progress Notes (Signed)
[facility] SITE 3 NUCLEAR MED [address] [place] [address] [phone]  Cardiology Nuclear Med Study  William Braun is a 77 y.o. male [id number] Sep 04, 1933   Nuclear Med Background Indication for Stress Test:  Evaluation for Ischemia and Abnormal EKG History: 09/12 Echo: EF 55% Aortic Sclerosis, Dilated RV, moderate RV dysfunction and Diastolic CHF, COPD Cardiac Risk Factors:08 CVA, History of Smoking, Hypertension and NIDDM  Symptoms:  Chest Pain, DOE, Palpitations and SOB   Nuclear Pre-Procedure Caffeine/Decaff Intake:  None NPO After: 7:00pm   Lungs:  Clear and diminshed in bases IV 0.9% NS with Angio Cath:  20g  IV Site: R Wrist  IV Started by:  Milana Na, EMT-P  Chest Size (in):  54 Cup Size: n/a  Height: 6\' 2"  (1.88 m)  Weight:  300 lb (136.079 kg)  BMI:  Body mass index is 38.52 kg/(m^2). Tech Comments:  Toprol held x 12 hrs    Nuclear Med Study 1 or 2 day study: 2 day  Stress Test Type:  Lexiscan  Reading MD: Charlton Haws, MD  Order Authorizing Provider:  Fransico Meadow  Resting Radionuclide: Technetium 77m Tetrofosmin  Resting Radionuclide Dose: 33.0 mCi on 08/10/11   Stress Radionuclide:  Technetium 11m Tetrofosmin  Stress Radionuclide Dose: 33.0 mCi on 08/09/11           Stress Protocol Rest HR: 80 Stress HR: 94  Rest BP: 129/70 Stress BP: 147/76  Exercise Time (min): n/a METS: n/a   Predicted Max HR: 171 bpm % Max HR: 89.47 bpm Rate Pressure Product: 21308   Dose of Adenosine (mg):  n/a Dose of Lexiscan: 0.4 mg  Dose of Atropine (mg): n/a Dose of Dobutamine: n/a mcg/kg/min (at max HR)  Stress Test Technologist: Milana Na, EMT-P  Nuclear Technologist:  Domenic Polite, CNMT     Rest Procedure:  Myocardial perfusion imaging was performed at rest 45 minutes following the intravenous administration of Technetium 62m Tetrofosmin. Rest ECG: Atrial Fibrilliation  Stress Procedure:  The patient received IV Lexiscan  0.4 mg over 15-seconds.  Technetium 51m Tetrofosmin injected at 30-seconds.  There were no significant changes and a rare pvc with Lexiscan.  Quantitative spect images were obtained after a 45 minute delay. Stress ECG: No significant change from baseline ECG  QPS Raw Data Images:  Normal; no motion artifact; normal heart/lung ratio. Stress Images:  Normal homogeneous uptake in all areas of the myocardium. Rest Images:  Normal homogeneous uptake in all areas of the myocardium. Subtraction (SDS):  No evidence of ischemia. Transient Ischemic Dilatation (Normal <1.22):  1.02 Lung/Heart Ratio (Normal <0.45):  0.11  Quantitative Gated Spect Images QGS EDV:  108 ml QGS ESV:  38 ml QGS cine images:  NL LV Function; NL Wall Motion QGS EF: 65%  Impression Exercise Capacity:  Lexiscan with no exercise. BP Response:  Normal blood pressure response. Clinical Symptoms:  No chest pain. ECG Impression:  No significant ST segment change suggestive of ischemia. Comparison with Prior Nuclear Study: No previous nuclear study performed  Overall Impression:  Normal stress nuclear study. Normal EF 65%   Cassell Clement

## 2011-08-10 ENCOUNTER — Ambulatory Visit (HOSPITAL_COMMUNITY): Payer: Medicare Other | Attending: Cardiology | Admitting: Radiology

## 2011-08-10 DIAGNOSIS — R0989 Other specified symptoms and signs involving the circulatory and respiratory systems: Secondary | ICD-10-CM

## 2011-08-10 MED ORDER — TECHNETIUM TC 99M TETROFOSMIN IV KIT
33.0000 | PACK | Freq: Once | INTRAVENOUS | Status: AC | PRN
  Administered 2011-08-10: 33 via INTRAVENOUS

## 2011-08-16 ENCOUNTER — Ambulatory Visit: Payer: Medicare Other | Admitting: Cardiology

## 2011-08-22 ENCOUNTER — Encounter (HOSPITAL_COMMUNITY): Payer: Self-pay

## 2011-08-22 ENCOUNTER — Encounter (HOSPITAL_COMMUNITY)
Admission: RE | Admit: 2011-08-22 | Discharge: 2011-08-22 | Disposition: A | Discharge status: Home / Self Care | Payer: Medicare Other | Source: Ambulatory Visit | Attending: Pulmonary Disease | Admitting: Pulmonary Disease

## 2011-08-22 DIAGNOSIS — Z5189 Encounter for other specified aftercare: Secondary | ICD-10-CM | POA: Insufficient documentation

## 2011-08-22 DIAGNOSIS — I509 Heart failure, unspecified: Secondary | ICD-10-CM | POA: Insufficient documentation

## 2011-08-22 DIAGNOSIS — J438 Other emphysema: Secondary | ICD-10-CM | POA: Insufficient documentation

## 2011-08-22 DIAGNOSIS — J9610 Chronic respiratory failure, unspecified whether with hypoxia or hypercapnia: Secondary | ICD-10-CM | POA: Insufficient documentation

## 2011-08-22 DIAGNOSIS — E678 Other specified hyperalimentation: Secondary | ICD-10-CM | POA: Insufficient documentation

## 2011-08-22 DIAGNOSIS — I10 Essential (primary) hypertension: Secondary | ICD-10-CM | POA: Insufficient documentation

## 2011-08-22 DIAGNOSIS — E785 Hyperlipidemia, unspecified: Secondary | ICD-10-CM | POA: Insufficient documentation

## 2011-08-22 NOTE — Progress Notes (Signed)
Demonstration and practice of PLB using pulse oximeter.  Patient able to return demonstration satisfactorily. Safety and hand hygiene in the exercise area reviewed with patient.  Patient voices understanding. Patient states he has difficulty with both shoulders, bursa.  We will be aware with exercise.

## 2011-08-23 ENCOUNTER — Encounter: Payer: Self-pay | Admitting: Pulmonary Disease

## 2011-08-23 ENCOUNTER — Ambulatory Visit (INDEPENDENT_AMBULATORY_CARE_PROVIDER_SITE_OTHER): Payer: Medicare Other | Admitting: Pulmonary Disease

## 2011-08-23 ENCOUNTER — Encounter (HOSPITAL_COMMUNITY): Payer: Medicare Other

## 2011-08-23 DIAGNOSIS — E678 Other specified hyperalimentation: Secondary | ICD-10-CM

## 2011-08-23 DIAGNOSIS — J438 Other emphysema: Secondary | ICD-10-CM

## 2011-08-23 NOTE — Progress Notes (Signed)
  Subjective:    Patient ID: William Braun, male    DOB: 1933/03/13, 77 y.o.   MRN: [id number]  HPI Patient comes in today for followup of his known obesity hypoventilation syndrome, mild COPD, and also has chronic diastolic heart failure.  He is maintained on bilevel successfully, and feels that he is doing well from that standpoint.  He is maintaining on his bronchodilator regimen, and has not had any worsening shortness of breath since the last visit.  He has gone for his initial visit for pulmonary rehabilitation, and starts program formally this week.   Review of Systems  Constitutional: Negative for fever and unexpected weight change.  HENT: Positive for congestion, rhinorrhea and postnasal drip. Negative for ear pain, nosebleeds, sore throat, sneezing, trouble swallowing, dental problem and sinus pressure.   Eyes: Positive for redness and itching.  Respiratory: Positive for cough, chest tightness, shortness of breath and wheezing.   Cardiovascular: Negative for palpitations and leg swelling.  Gastrointestinal: Negative for nausea and vomiting.  Genitourinary: Negative for dysuria.  Musculoskeletal: Negative for joint swelling.  Skin: Negative for rash.  Neurological: Negative for headaches.  Hematological: Bruises/bleeds easily.  Psychiatric/Behavioral: Positive for dysphoric mood. The patient is not nervous/anxious.        Objective:   Physical Exam Obese male in no acute distress No skin breakdown or pressure necrosis from the CPAP mask Chest with mild basilar crackles, good air movement, no wheezing Cardiac exam with regular rate and rhythm Lower extremities with 1+ edema, no cyanosis noted Alert and oriented, moves all 4 extremities.       Assessment & Plan:

## 2011-08-23 NOTE — Assessment & Plan Note (Signed)
He is continuing on bilevel for his obesity hypoventilation, and is having no issues with that pressure or mask fit currently.  I have asked him to continue on this, and to work aggressively on weight loss.

## 2011-08-23 NOTE — Assessment & Plan Note (Signed)
The patient is doing well on his current bronchodilator regimen.

## 2011-08-23 NOTE — Patient Instructions (Signed)
Finish up pulmonary rehab, continue to work on weight loss Call if any issues with your bipap machine or mask Keep followup apptm with me already scheduled in April, but call if having issues.

## 2011-08-25 ENCOUNTER — Encounter (HOSPITAL_COMMUNITY)
Admission: RE | Admit: 2011-08-25 | Discharge: 2011-08-25 | Disposition: A | Discharge status: Home / Self Care | Payer: Medicare Other | Source: Ambulatory Visit | Attending: Pulmonary Disease | Admitting: Pulmonary Disease

## 2011-08-25 LAB — GLUCOSE, CAPILLARY
Glucose-Capillary: 124 mg/dL — ABNORMAL HIGH (ref 70–99)
Glucose-Capillary: 153 mg/dL — ABNORMAL HIGH (ref 70–99)

## 2011-08-25 NOTE — Progress Notes (Signed)
First day for Pulmonary Rehab.  Patient oriented to equipment use and safety.  He will use a rolling walker while in the exercise for fall precaution.  Demonstration and practice of PLB with patient on each exercise station.  Tolerated well.  He was able to participate in warm up / cool down stretches and bands without difficulty.  Vital signs stable.

## 2011-08-30 ENCOUNTER — Encounter (HOSPITAL_COMMUNITY)
Admission: RE | Admit: 2011-08-30 | Discharge: 2011-08-30 | Disposition: A | Discharge status: Home / Self Care | Payer: Medicare Other | Source: Ambulatory Visit | Attending: Pulmonary Disease | Admitting: Pulmonary Disease

## 2011-09-01 ENCOUNTER — Encounter (HOSPITAL_COMMUNITY)
Admission: RE | Admit: 2011-09-01 | Discharge: 2011-09-01 | Disposition: A | Discharge status: Home / Self Care | Payer: Medicare Other | Source: Ambulatory Visit | Attending: Pulmonary Disease | Admitting: Pulmonary Disease

## 2011-09-06 ENCOUNTER — Encounter (HOSPITAL_COMMUNITY)
Admission: RE | Admit: 2011-09-06 | Discharge: 2011-09-06 | Disposition: A | Discharge status: Home / Self Care | Payer: Medicare Other | Source: Ambulatory Visit | Attending: Pulmonary Disease | Admitting: Pulmonary Disease

## 2011-09-08 ENCOUNTER — Encounter (HOSPITAL_COMMUNITY): Payer: Medicare Other

## 2011-09-13 ENCOUNTER — Encounter (HOSPITAL_COMMUNITY): Payer: Medicare Other

## 2011-09-15 ENCOUNTER — Encounter (HOSPITAL_COMMUNITY): Payer: Medicare Other

## 2011-09-20 ENCOUNTER — Encounter (HOSPITAL_COMMUNITY): Payer: Medicare Other

## 2011-09-20 DIAGNOSIS — J438 Other emphysema: Secondary | ICD-10-CM | POA: Insufficient documentation

## 2011-09-20 DIAGNOSIS — E678 Other specified hyperalimentation: Secondary | ICD-10-CM | POA: Insufficient documentation

## 2011-09-20 DIAGNOSIS — I10 Essential (primary) hypertension: Secondary | ICD-10-CM | POA: Insufficient documentation

## 2011-09-20 DIAGNOSIS — I509 Heart failure, unspecified: Secondary | ICD-10-CM | POA: Insufficient documentation

## 2011-09-20 DIAGNOSIS — Z5189 Encounter for other specified aftercare: Secondary | ICD-10-CM | POA: Insufficient documentation

## 2011-09-20 DIAGNOSIS — J9610 Chronic respiratory failure, unspecified whether with hypoxia or hypercapnia: Secondary | ICD-10-CM | POA: Insufficient documentation

## 2011-09-20 DIAGNOSIS — E785 Hyperlipidemia, unspecified: Secondary | ICD-10-CM | POA: Insufficient documentation

## 2011-09-22 ENCOUNTER — Encounter (HOSPITAL_COMMUNITY): Payer: Medicare Other

## 2011-09-27 ENCOUNTER — Encounter (HOSPITAL_COMMUNITY)
Admission: RE | Admit: 2011-09-27 | Discharge: 2011-09-27 | Disposition: A | Discharge status: Home / Self Care | Payer: Medicare Other | Source: Ambulatory Visit | Attending: Pulmonary Disease | Admitting: Pulmonary Disease

## 2011-09-27 NOTE — Progress Notes (Signed)
Completed home exercise with patient. Reviewed exercise progression, exercise routine, RPE/Dyspnea scales, environmental factors, warning signs and symptoms,  CP/NTG and when to call MD. Patient is to walk daily and use stretch resistance bands. Patient states he has a gym membership so we talked about safe ways to exercise with oxygen in a gym setting. Patient voices understanding and has the goal to breathe better and become less short of breath like he was before hospitalization. Will continue to encourage and support.

## 2011-09-29 ENCOUNTER — Encounter (HOSPITAL_COMMUNITY)
Admission: RE | Admit: 2011-09-29 | Discharge: 2011-09-29 | Disposition: A | Discharge status: Home / Self Care | Payer: Medicare Other | Source: Ambulatory Visit | Attending: Pulmonary Disease | Admitting: Pulmonary Disease

## 2011-09-29 NOTE — Progress Notes (Signed)
Nutrition Assessment Lanelle Bal Rost  Time Spent: 15 min   78 y.o. male with obesity hypoventilation syndrome, mild COPD Past Medical History  Diagnosis Date  . Other and unspecified hyperlipidemia   . Unspecified essential hypertension   . Type II or unspecified type diabetes mellitus without mention of complication, not stated as uncontrolled   . Osteoarthritis   . Obesity   . History of thrombocytopenia   . Cerebrovascular disease   . Atrial fibrillation    Current tobacco use?  No   Meds: Metformin, Lasix, Potassium Chloride, Warfarin Labs: No recent A1c noted HT 74" Ht Readings from Last 1 Encounters:  08/23/11 6\' 2"  (1.88 m)  WT   307 lb (139.6 kg) Wt Readings from Last 3 Encounters:  08/23/11 300 lb (136.079 kg)  08/09/11 300 lb (136.079 kg)  08/03/11 300 lb (136.079 kg)  IBW 86.4   162%IBW BMI 39.5   %body fat 40.3 Assessment: Pt is obese.  Pt reports he has lost 50 lbs over the past 10-12 months.  Pt eats 3 meals a day; most prepared at home.  There are some ways pt can change his diet to become healthier.  Pt's Rate Your Plate results reviewed with pt.  Pt expressed understanding.  Pt avoids many salty foods; uses "very little" canned/ convenience food.  Pt does not add salt to food.  The role of sodium in lung disease reviewed with pt.  Pt is diabetic.  No recent A1c noted.  Nutrition Diagnosis   Food-and nutrition-related knowledge deficit related to lack of exposure to information as related to diagnosis of pulmonary disease   Obesity related to excessive energy intake as evidenced by a BMI of 39.5 Nutrition Rx/Est. Daily Nutrition Needs for: ? wt loss 1850-2350 Kcal  125-140 gm protein   1500-2000 mg or less sodium     250 gm CHO Nutrition Intervention   Pt's individual nutrition plan and goals reviewed with pt.   Benefits of adopting healthy eating habits discussed when pt's Rate Your Plate reviewed.   Pt to attend the Nutrition and Lung Disease class   Continual  client-centered nutrition education by RD, as part of interdisciplinary care. Goal(s) 1. Identify food quantities necessary to achieve wt loss of  -2# per week to a goal wt of 128.6-134.1 kg ([phone] #) at graduation from pulmonary rehab. 2. Use pre-/post meal and/or exercise CBG's and A1c to determine whether adjustments in food/meal planning will be beneficial or if any meds need to be combined with nutrition therapy. Monitor and Evaluate progress toward nutrition goal with team.

## 2011-10-04 ENCOUNTER — Encounter (HOSPITAL_COMMUNITY)
Admission: RE | Admit: 2011-10-04 | Discharge: 2011-10-04 | Disposition: A | Discharge status: Home / Self Care | Payer: Medicare Other | Source: Ambulatory Visit | Attending: Pulmonary Disease | Admitting: Pulmonary Disease

## 2011-10-06 ENCOUNTER — Encounter (HOSPITAL_COMMUNITY)
Admission: RE | Admit: 2011-10-06 | Discharge: 2011-10-06 | Disposition: A | Discharge status: Home / Self Care | Payer: Medicare Other | Source: Ambulatory Visit | Attending: Pulmonary Disease | Admitting: Pulmonary Disease

## 2011-10-11 ENCOUNTER — Encounter (HOSPITAL_COMMUNITY)
Admission: RE | Admit: 2011-10-11 | Discharge: 2011-10-11 | Disposition: A | Discharge status: Home / Self Care | Payer: Medicare Other | Source: Ambulatory Visit | Attending: Pulmonary Disease | Admitting: Pulmonary Disease

## 2011-10-13 ENCOUNTER — Encounter (HOSPITAL_COMMUNITY)
Admission: RE | Admit: 2011-10-13 | Discharge: 2011-10-13 | Disposition: A | Discharge status: Home / Self Care | Payer: Medicare Other | Source: Ambulatory Visit | Attending: Pulmonary Disease | Admitting: Pulmonary Disease

## 2011-10-18 ENCOUNTER — Encounter (HOSPITAL_COMMUNITY)
Admission: RE | Admit: 2011-10-18 | Discharge: 2011-10-18 | Disposition: A | Discharge status: Home / Self Care | Payer: Medicare Other | Source: Ambulatory Visit | Attending: Pulmonary Disease | Admitting: Pulmonary Disease

## 2011-10-18 NOTE — Progress Notes (Signed)
William Braun was noted to have increase in weight today  from 10-13-11 of 2.2 kg.  He was questioned about increased sodium intake which he states he has done.  He is also more short of breath.  He is advised to call his primary care MD regarding the above.

## 2011-10-20 ENCOUNTER — Encounter (HOSPITAL_COMMUNITY)
Admission: RE | Admit: 2011-10-20 | Discharge: 2011-10-20 | Disposition: A | Discharge status: Home / Self Care | Payer: Medicare Other | Source: Ambulatory Visit | Attending: Pulmonary Disease | Admitting: Pulmonary Disease

## 2011-10-25 ENCOUNTER — Encounter (HOSPITAL_COMMUNITY)
Admission: RE | Admit: 2011-10-25 | Discharge: 2011-10-25 | Disposition: A | Discharge status: Home / Self Care | Payer: Medicare Other | Source: Ambulatory Visit | Attending: Pulmonary Disease | Admitting: Pulmonary Disease

## 2011-10-25 DIAGNOSIS — J9610 Chronic respiratory failure, unspecified whether with hypoxia or hypercapnia: Secondary | ICD-10-CM | POA: Insufficient documentation

## 2011-10-25 DIAGNOSIS — I10 Essential (primary) hypertension: Secondary | ICD-10-CM | POA: Insufficient documentation

## 2011-10-25 DIAGNOSIS — E785 Hyperlipidemia, unspecified: Secondary | ICD-10-CM | POA: Insufficient documentation

## 2011-10-25 DIAGNOSIS — J438 Other emphysema: Secondary | ICD-10-CM | POA: Insufficient documentation

## 2011-10-25 DIAGNOSIS — Z5189 Encounter for other specified aftercare: Secondary | ICD-10-CM | POA: Insufficient documentation

## 2011-10-25 DIAGNOSIS — E678 Other specified hyperalimentation: Secondary | ICD-10-CM | POA: Insufficient documentation

## 2011-10-25 DIAGNOSIS — I509 Heart failure, unspecified: Secondary | ICD-10-CM | POA: Insufficient documentation

## 2011-10-27 ENCOUNTER — Encounter (HOSPITAL_COMMUNITY)
Admission: RE | Admit: 2011-10-27 | Discharge: 2011-10-27 | Disposition: A | Discharge status: Home / Self Care | Payer: Medicare Other | Source: Ambulatory Visit | Attending: Pulmonary Disease | Admitting: Pulmonary Disease

## 2011-11-01 ENCOUNTER — Encounter (HOSPITAL_COMMUNITY)
Admission: RE | Admit: 2011-11-01 | Discharge: 2011-11-01 | Disposition: A | Discharge status: Home / Self Care | Payer: Medicare Other | Source: Ambulatory Visit | Attending: Pulmonary Disease | Admitting: Pulmonary Disease

## 2011-11-02 ENCOUNTER — Ambulatory Visit (INDEPENDENT_AMBULATORY_CARE_PROVIDER_SITE_OTHER): Payer: Medicare Other | Admitting: Cardiology

## 2011-11-02 ENCOUNTER — Encounter: Payer: Self-pay | Admitting: Cardiology

## 2011-11-02 DIAGNOSIS — I509 Heart failure, unspecified: Secondary | ICD-10-CM

## 2011-11-02 DIAGNOSIS — I5032 Chronic diastolic (congestive) heart failure: Secondary | ICD-10-CM

## 2011-11-02 DIAGNOSIS — R0602 Shortness of breath: Secondary | ICD-10-CM

## 2011-11-02 DIAGNOSIS — E785 Hyperlipidemia, unspecified: Secondary | ICD-10-CM

## 2011-11-02 DIAGNOSIS — E678 Other specified hyperalimentation: Secondary | ICD-10-CM

## 2011-11-02 DIAGNOSIS — I4891 Unspecified atrial fibrillation: Secondary | ICD-10-CM

## 2011-11-02 LAB — LIPID PANEL
HDL: 34.5 mg/dL — ABNORMAL LOW (ref >39.00)
LDL Cholesterol: 39 mg/dL (ref 0–99)
Total CHOL/HDL Ratio: 3
Triglycerides: 187 mg/dL — ABNORMAL HIGH (ref 0.0–149.0)

## 2011-11-02 LAB — BASIC METABOLIC PANEL
BUN: 23 mg/dL (ref 6–23)
CO2: 31 mEq/L (ref 19–32)
Calcium: 9 mg/dL (ref 8.4–10.5)
GFR: 51.45 mL/min — ABNORMAL LOW (ref >60.00)
Glucose, Bld: 190 mg/dL — ABNORMAL HIGH (ref 70–99)
Potassium: 4 mEq/L (ref 3.5–5.1)
Sodium: 138 mEq/L (ref 135–145)

## 2011-11-02 MED ORDER — FUROSEMIDE 40 MG PO TABS: ORAL_TABLET | ORAL | Status: DC

## 2011-11-02 NOTE — Progress Notes (Signed)
PCP: Dr. Earl Gala  78 yo with h/o HTN, CVA, diabetes, obesity-hypoventilation syndrome, diastolic CHF, and chronic atrial fibrillation presents for cardiology followup.  He has OHS and uses BIPAP at night and oxygen with exertion during the day.  He has atrial fibrillation with reasonable rate control and is on coumadin.  He has CHF that I suspect is primarily right-sided from OHS with resulting pulmonary hypertension.  Weight is up since last appointment.  He is short of breath walking around his house.  No chest pain.  He has some difficulty with balance.  He is doing pulmonary rehab.    Labs (2/10): creatinine 1.15, BNP 73, TSH normal  Labs (9/12): K 4.1, creatinine 1.4, BNP 90 Labs (11/12): K 4.2, creatinine 1.9  ECG: atrial fibrillation at 78, iRBBB  Allergies (verified):  1) ! Quinine   Past Medical History:  1. Atrial Fibrillation: apparently developed post-op right TKR in 2/10. Pt was started on coumadin. Now chronic.  2. Diabetes Type 2  3. Hyperlipidemia  4. Hypertension  5. Cerebrovascular Disease-CVA-2008  6. Obesity  7. Osteoarthritis left knee, s/p R TKR  8. History of thrombocytopenia  9. Diastolic CHF: Echo (9/12) with EF 55%, moderately dilated RV with moderate RV systolic dysfunction, PA systolic pressure 36 mmHg.  10. Obesity hypoventilation syndrome: BIPAP at night, O2 during the day with exertion. 11. Prolonged hospitalization in fall 2011 with Strep agalactiae bacteremia, MRSA PNA, and septic shock.  12. Lexiscan myoview (11/12) with EF 65%, no ischemia or infarction.   Family History:  Father: deceased MVA  Mother: deceased MVA   Social History:  Married  Tobacco Use - Former. -quit >35 years ago  3 children  Former [employer]  Review of Systems  negative except as per HPI  Current Outpatient Prescriptions  Medication Sig Dispense Refill  . acetaminophen (TYLENOL) 325 MG tablet Take 650 mg by mouth every 6 (six) hours as needed.        Marland Kitchen albuterol  (VENTOLIN HFA) 108 (90 BASE) MCG/ACT inhaler Inhale 2 puffs into the lungs every 6 (six) hours as needed.        Marland Kitchen amLODipine (NORVASC) 5 MG tablet Take 5 mg by mouth daily.        . budesonide-formoterol (SYMBICORT) 160-4.5 MCG/ACT inhaler Inhale 2 puffs into the lungs 2 (two) times daily. 2 puffs twice daily  1 Inhaler  6  . diphenhydrAMINE (BENADRYL) 25 mg capsule Take 25 mg by mouth at bedtime as needed.        Marland Kitchen HYDROcodone-acetaminophen (VICODIN) 5-500 MG per tablet Take 2 tablets by mouth every 6 (six) hours as needed.        . metFORMIN (GLUCOPHAGE) 500 MG tablet 1 tab twice a day      . metoprolol succinate (TOPROL-XL) 25 MG 24 hr tablet Take 1/2 tab by mouth two times a day      . nitroGLYCERIN (NITROSTAT) 0.4 MG SL tablet Place 1 tablet (0.4 mg total) under the tongue every 5 (five) minutes as needed for chest pain.  25 tablet  3  . pantoprazole (PROTONIX) 40 MG tablet Take 1 tablet by mouth daily.      . potassium chloride SA (K-DUR,KLOR-CON) 20 MEQ tablet Take 0.5 tablets (10 mEq total) by mouth daily.      . simvastatin (ZOCOR) 20 MG tablet Take 20 mg by mouth at bedtime.        Marland Kitchen warfarin (COUMADIN) 3 MG tablet Take 1 1/2 tabs by mouth once  a day or as directed.      Marland Kitchen DISCONTD: furosemide (LASIX) 40 MG tablet TAKE 1 AND 1/2 (HALF) TABLETS IN THE AM AND TAKE 1 TABLET IN THE PM      . furosemide (LASIX) 40 MG tablet Take 2 tablets in the morning and 1 tablet in the afternoon about 4-5PM  90 tablet  6    BP 140/76  Pulse 76  Ht 6\' 2"  (1.88 m)  Wt 143 kg (315 lb 4.1 oz)  BMI 40.48 kg/m2 General: NAD, obese Neck: JVP 8-9 cm, no thyromegaly or thyroid nodule.  Lungs: Slight crackles at bases bilaterally.  CV: Nondisplaced PMI.  Heart irregular S1/S2, no S3/S4, 2/6 SEM. 1+ edema to knees bilaterally.  No carotid bruit.  Unable to feel pedal pulses in setting of significant edema. Abdomen: Soft, nontender, no hepatosplenomegaly, no distention.  Neurologic: Alert and oriented x 3.    Psych: Normal affect. Extremities: No clubbing or cyanosis.

## 2011-11-02 NOTE — Assessment & Plan Note (Signed)
Check lipids 

## 2011-11-02 NOTE — Patient Instructions (Addendum)
Increase lasix(furosemide) to 80mg  in the morning and 40mg  in the afternoon about 4-5PM. This will be two 40mg  tablets in the morning and one 40mg  tablet in the afternoon.  Your physician recommends that you have a FASTING lipid profile /BMET/BNP today  Your physician recommends that you return for lab work in: 2 weeks--BMET/BNP   Your physician recommends that you schedule a follow-up appointment in: 6 weeks with Dr Shirlee Latch.

## 2011-11-02 NOTE — Assessment & Plan Note (Signed)
Chronic atrial fibrillation.  Good HR control on low dose of Toprol XL.  On warfarin for stroke prophylaxis.  

## 2011-11-02 NOTE — Assessment & Plan Note (Signed)
NYHA class IIIb symptoms with volume overload and weight gain.  Increase Lasix to 80 mg qam, 40 mg qpm.  Will need to follow creatinine closely, needs BMET/BNP in 2 wks.

## 2011-11-02 NOTE — Assessment & Plan Note (Signed)
On BIPAP at night and oxygen with exertion.  I suspect that hypoxemia from OHS with pulmonary HTN has been a significant contributor to the patient's heart failure.  Echo in 9/12 with moderate RV dysfunction.  

## 2011-11-03 ENCOUNTER — Encounter (HOSPITAL_COMMUNITY)
Admission: RE | Admit: 2011-11-03 | Discharge: 2011-11-03 | Disposition: A | Discharge status: Home / Self Care | Payer: Medicare Other | Source: Ambulatory Visit | Attending: Pulmonary Disease | Admitting: Pulmonary Disease

## 2011-11-08 ENCOUNTER — Encounter (HOSPITAL_COMMUNITY)
Admission: RE | Admit: 2011-11-08 | Discharge: 2011-11-08 | Disposition: A | Discharge status: Home / Self Care | Payer: Medicare Other | Source: Ambulatory Visit | Attending: Pulmonary Disease | Admitting: Pulmonary Disease

## 2011-11-10 ENCOUNTER — Encounter (HOSPITAL_COMMUNITY)
Admission: RE | Admit: 2011-11-10 | Discharge: 2011-11-10 | Disposition: A | Discharge status: Home / Self Care | Payer: Medicare Other | Source: Ambulatory Visit | Attending: Pulmonary Disease | Admitting: Pulmonary Disease

## 2011-11-15 ENCOUNTER — Encounter (HOSPITAL_COMMUNITY)
Admission: RE | Admit: 2011-11-15 | Discharge: 2011-11-15 | Disposition: A | Discharge status: Home / Self Care | Payer: Medicare Other | Source: Ambulatory Visit | Attending: Pulmonary Disease | Admitting: Pulmonary Disease

## 2011-11-16 ENCOUNTER — Other Ambulatory Visit (INDEPENDENT_AMBULATORY_CARE_PROVIDER_SITE_OTHER): Payer: Medicare Other

## 2011-11-16 DIAGNOSIS — R0602 Shortness of breath: Secondary | ICD-10-CM

## 2011-11-16 DIAGNOSIS — I509 Heart failure, unspecified: Secondary | ICD-10-CM

## 2011-11-16 DIAGNOSIS — I5032 Chronic diastolic (congestive) heart failure: Secondary | ICD-10-CM

## 2011-11-16 LAB — BASIC METABOLIC PANEL
BUN: 20 mg/dL (ref 6–23)
Creatinine, Ser: 1.3 mg/dL (ref 0.4–1.5)
GFR: 55.97 mL/min — ABNORMAL LOW (ref >60.00)

## 2011-11-16 LAB — BRAIN NATRIURETIC PEPTIDE: Pro B Natriuretic peptide (BNP): 49 pg/mL (ref 0.0–100.0)

## 2011-11-17 ENCOUNTER — Encounter (HOSPITAL_COMMUNITY)
Admission: RE | Admit: 2011-11-17 | Discharge: 2011-11-17 | Disposition: A | Discharge status: Home / Self Care | Payer: Medicare Other | Source: Ambulatory Visit | Attending: Pulmonary Disease | Admitting: Pulmonary Disease

## 2011-11-22 ENCOUNTER — Encounter (HOSPITAL_COMMUNITY)
Admission: RE | Admit: 2011-11-22 | Discharge: 2011-11-22 | Disposition: A | Discharge status: Home / Self Care | Payer: Medicare Other | Source: Ambulatory Visit | Attending: Pulmonary Disease | Admitting: Pulmonary Disease

## 2011-11-22 DIAGNOSIS — J438 Other emphysema: Secondary | ICD-10-CM | POA: Insufficient documentation

## 2011-11-22 DIAGNOSIS — E785 Hyperlipidemia, unspecified: Secondary | ICD-10-CM | POA: Insufficient documentation

## 2011-11-22 DIAGNOSIS — Z5189 Encounter for other specified aftercare: Secondary | ICD-10-CM | POA: Insufficient documentation

## 2011-11-22 DIAGNOSIS — E678 Other specified hyperalimentation: Secondary | ICD-10-CM | POA: Insufficient documentation

## 2011-11-22 DIAGNOSIS — I509 Heart failure, unspecified: Secondary | ICD-10-CM | POA: Insufficient documentation

## 2011-11-22 DIAGNOSIS — J9610 Chronic respiratory failure, unspecified whether with hypoxia or hypercapnia: Secondary | ICD-10-CM | POA: Insufficient documentation

## 2011-11-22 DIAGNOSIS — I10 Essential (primary) hypertension: Secondary | ICD-10-CM | POA: Insufficient documentation

## 2011-11-24 ENCOUNTER — Encounter (HOSPITAL_COMMUNITY)
Admission: RE | Admit: 2011-11-24 | Discharge: 2011-11-24 | Disposition: A | Discharge status: Home / Self Care | Payer: Medicare Other | Source: Ambulatory Visit | Attending: Pulmonary Disease | Admitting: Pulmonary Disease

## 2011-11-29 ENCOUNTER — Encounter (HOSPITAL_COMMUNITY)
Admission: RE | Admit: 2011-11-29 | Discharge: 2011-11-29 | Disposition: A | Discharge status: Home / Self Care | Payer: Medicare Other | Source: Ambulatory Visit | Attending: Pulmonary Disease | Admitting: Pulmonary Disease

## 2011-12-01 ENCOUNTER — Encounter (HOSPITAL_COMMUNITY)
Admission: RE | Admit: 2011-12-01 | Discharge: 2011-12-01 | Disposition: A | Discharge status: Home / Self Care | Payer: Medicare Other | Source: Ambulatory Visit | Attending: Pulmonary Disease | Admitting: Pulmonary Disease

## 2011-12-01 NOTE — Progress Notes (Signed)
Pulmonary Rehabilitation Program Progress Report   Orientation:  08/21/2011 Graduate Date:  24 Discharge Date:  12/01/2011 # of sessions completed: 24  Pulmonologist: Clance Class Time:  1:30  A.  Exercise Program:  Tolerates exercise @ 2.5 METS for 45 minutes, Walk Test Results:  Pre: 352ft and Post: 649ft, Improved functional capacity  78.04 %, Improved  muscular strength  5.26 %, No Change dyspnea score 0.00 %, Decreased education score 1.0 %, Exercise limited by dyspnea, Exercise limited by musculoskeletal problems and Discharged to home exercise program.  Anticipated compliance:  good  B.  Mental Health:  Good mental attitude, Health related anxiety and Quality of Life (QOL)  changes:  Overall  11.92 %, Health/Functioning 23.23 %, Socioeconomics 15.92 %, Psych/Spiritual -8.31 %, Family 20.00 %    C.  Education/Instruction/Skills  Uses Perceived Exertion Scale and/or Dyspnea Scale and Attended 9 education classes  Demonstrates accurate pursed lip breathing and diaphragmatic breathing.  D.  Nutrition/Weight Control/Body Composition:  % Body Fat  42.5, Patient has gained 4.4 kg and CBG self-monitoring  *This section completed by Mickle Plumb, Andres Shad, RD, LDN, CDE  E.  Blood Lipids    Lab Results  Component Value Date   CHOL 111 11/02/2011     Lab Results  Component Value Date   TRIG 187.0* 11/02/2011     Lab Results  Component Value Date   HDL 34.50* 11/02/2011     Lab Results  Component Value Date   CHOLHDL 3 11/02/2011     No results found for this basename: LDLDIRECT      F.  Lifestyle Changes:  Making positive lifestyle changes  G.  Symptoms noted with exercise:  Shortness of breath, Dizziness and Fatigue  Report Completed By:  Brock Ra L   Comments:  Patient did well with the program. Patient improved overall. Patient met all program and personal goals. Patient maintained SPAo2 above 90% on 3L. Patient plans to attend  the [facility] for continuation of home exercise. Thank you for the referral.

## 2011-12-06 ENCOUNTER — Encounter (HOSPITAL_COMMUNITY): Payer: Medicare Other

## 2011-12-08 ENCOUNTER — Encounter (HOSPITAL_COMMUNITY): Payer: Medicare Other

## 2011-12-12 ENCOUNTER — Encounter: Payer: Self-pay | Admitting: Cardiology

## 2011-12-12 ENCOUNTER — Ambulatory Visit (INDEPENDENT_AMBULATORY_CARE_PROVIDER_SITE_OTHER): Payer: Medicare Other | Admitting: Cardiology

## 2011-12-12 VITALS — BP 138/72 | HR 64 | Ht 74.0 in | Wt 310.0 lb

## 2011-12-12 DIAGNOSIS — I5030 Unspecified diastolic (congestive) heart failure: Secondary | ICD-10-CM

## 2011-12-12 DIAGNOSIS — I509 Heart failure, unspecified: Secondary | ICD-10-CM

## 2011-12-12 DIAGNOSIS — E678 Other specified hyperalimentation: Secondary | ICD-10-CM

## 2011-12-12 DIAGNOSIS — E785 Hyperlipidemia, unspecified: Secondary | ICD-10-CM

## 2011-12-12 DIAGNOSIS — I5032 Chronic diastolic (congestive) heart failure: Secondary | ICD-10-CM

## 2011-12-12 DIAGNOSIS — I4891 Unspecified atrial fibrillation: Secondary | ICD-10-CM

## 2011-12-12 MED ORDER — FUROSEMIDE 40 MG PO TABS: ORAL_TABLET | ORAL | Status: DC

## 2011-12-12 NOTE — Assessment & Plan Note (Signed)
Chronic atrial fibrillation.  Good HR control on low dose of Toprol XL.  On warfarin for stroke prophylaxis.  

## 2011-12-12 NOTE — Patient Instructions (Signed)
Increase lasix (furosemide) to alternate two 40mg  tablets in the morning and one 40mg  tablet in the afternoon about 4-5 PM with two 40mg  tablets in the morning and two 40mg  tablets in the afternoon about 4-5 PM.  Your physician recommends that you return for lab work in: 2 weeks--BMET/BNP  Your physician wants you to follow-up in: 4 months with Dr Shirlee Latch. (July 2013). You will receive a reminder letter in the mail two months in advance. If you don't receive a letter, please call our office to schedule the follow-up appointment.

## 2011-12-12 NOTE — Progress Notes (Signed)
PCP: Dr. Earl Gala  78 yo with h/o HTN, CVA, diabetes, obesity-hypoventilation syndrome, diastolic CHF, and chronic atrial fibrillation presents for cardiology followup.  He has OHS and uses BIPAP at night and oxygen with exertion during the day.  He has atrial fibrillation with reasonable rate control and is on coumadin.  He has CHF that I suspect is primarily right-sided from OHS with resulting pulmonary hypertension.  Weight is down 5 lbs since I increased his Lasix.  He is still short of breath walking around his house, this has not really changed any.  No chest pain.  He has finished pulmonary rehab and feels like this helped.  He has been going to the gym and using the Nustep machine for about 30 minutes every other day.    Labs (2/10): creatinine 1.15, BNP 73, TSH normal  Labs (9/12): K 4.1, creatinine 1.4, BNP 90 Labs (11/12): K 4.2, creatinine 1.9 Labs (2/13): K 4.2, creatinine 1.3, proBNP 49, LDL 39, HDL 35  Allergies (verified):  1) ! Quinine   Past Medical History:  1. Atrial Fibrillation: apparently developed post-op right TKR in 2/10. Pt was started on coumadin. Now chronic.  2. Diabetes Type 2  3. Hyperlipidemia  4. Hypertension  5. Cerebrovascular Disease-CVA-2008  6. Obesity  7. Osteoarthritis left knee, s/p R TKR  8. History of thrombocytopenia  9. Diastolic CHF: Echo (9/12) with EF 55%, moderately dilated RV with moderate RV systolic dysfunction, PA systolic pressure 36 mmHg.  10. Obesity hypoventilation syndrome: BIPAP at night, O2 during the day with exertion. 11. Prolonged hospitalization in fall 2011 with Strep agalactiae bacteremia, MRSA PNA, and septic shock.  12. Lexiscan myoview (11/12) with EF 65%, no ischemia or infarction.   Family History:  Father: deceased MVA  Mother: deceased MVA   Social History:  Married  Tobacco Use - Former. -quit >35 years ago  3 children  Former [employer]  Review of Systems  negative except as per HPI  Current Outpatient  Prescriptions  Medication Sig Dispense Refill  . acetaminophen (TYLENOL) 325 MG tablet Take 650 mg by mouth every 6 (six) hours as needed.        Marland Kitchen albuterol (VENTOLIN HFA) 108 (90 BASE) MCG/ACT inhaler Inhale 2 puffs into the lungs every 6 (six) hours as needed.        Marland Kitchen amLODipine (NORVASC) 5 MG tablet Take 5 mg by mouth daily.        . budesonide-formoterol (SYMBICORT) 160-4.5 MCG/ACT inhaler Inhale 2 puffs into the lungs 2 (two) times daily. 2 puffs twice daily  1 Inhaler  6  . diphenhydrAMINE (BENADRYL) 25 mg capsule Take 25 mg by mouth at bedtime as needed.        Marland Kitchen HYDROcodone-acetaminophen (VICODIN) 5-500 MG per tablet Take 2 tablets by mouth every 6 (six) hours as needed.        . metFORMIN (GLUCOPHAGE) 500 MG tablet 1 tab twice a day      . metoprolol succinate (TOPROL-XL) 25 MG 24 hr tablet Take 1/2 tab by mouth two times a day      . nitroGLYCERIN (NITROSTAT) 0.4 MG SL tablet Place 1 tablet (0.4 mg total) under the tongue every 5 (five) minutes as needed for chest pain.  25 tablet  3  . pantoprazole (PROTONIX) 40 MG tablet Take 1 tablet by mouth daily.      . potassium chloride SA (K-DUR,KLOR-CON) 20 MEQ tablet Take 0.5 tablets (10 mEq total) by mouth daily.      Marland Kitchen  simvastatin (ZOCOR) 20 MG tablet Take 20 mg by mouth at bedtime.        Marland Kitchen warfarin (COUMADIN) 3 MG tablet Take 1 1/2 tabs by mouth once a day or as directed.      Marland Kitchen DISCONTD: furosemide (LASIX) 40 MG tablet Take 2 tablets in the morning and 1 tablet in the afternoon about 4-5PM  90 tablet  6  . furosemide (LASIX) 40 MG tablet Alternate 2 tablets in the morning and 1 tablet in the afternoon about 4-5 PM with 2 tablets in the morning and 2 tablets in the afternoon about 4-5PM  120 tablet  11  . DISCONTD: furosemide (LASIX) 40 MG tablet Alternate 2 tablets in the morning and 1 tablet in the afternoon about 4-5 PM with 2 tablets in the morning and 2 tablets in the afternoon about 4-5PM  120 tablet  11  . DISCONTD: furosemide  (LASIX) 40 MG tablet Alternate 2 tablets in the morning and 1 tablet in the afternoon about 4-5 PM with 2 tablets in the morning and 2 tablets in the afternoon about 4-5PM  120 tablet  11    BP 138/72  Pulse 64  Ht 6\' 2"  (1.88 m)  Wt 310 lb (140.615 kg)  BMI 39.80 kg/m2 General: NAD, obese Neck: JVP 10 cm, no thyromegaly or thyroid nodule.  Lungs: Slight crackles at bases bilaterally.  CV: Nondisplaced PMI.  Heart irregular S1/S2, no S3/S4, 1/6 SEM. 1+ edema to knees bilaterally (improved).  No carotid bruit.  Unable to feel pedal pulses in setting of significant edema. Abdomen: Soft, nontender, no hepatosplenomegaly, no distention.  Neurologic: Alert and oriented x 3.  Psych: Normal affect. Extremities: No clubbing or cyanosis.

## 2011-12-12 NOTE — Progress Notes (Signed)
Addendum to Nutrition Section of Pulmonary Rehab Program Progress Report  Pt wt up 5.5 kg. Pt needs continued MD encouragement to lose wt. CBG control: 21 out of 21 pre-exercise CBG's WNL and 7 out of 17 post-exercise CBG's WNL.  CBG's fairly well-controlled.

## 2011-12-13 NOTE — Assessment & Plan Note (Signed)
NYHA class IIIb symptoms with volume overload.  However, volume does look improved compared to prior and weight is down.  Increase Lasix to 80 mg qam, 40 mg qpm alternating every other day with Lasix 80 mg bid.  BMET/BNP in 2 wks.

## 2011-12-13 NOTE — Assessment & Plan Note (Signed)
Excellent lipids in 2/13.

## 2011-12-13 NOTE — Assessment & Plan Note (Signed)
On BIPAP at night and oxygen with exertion.  I suspect that hypoxemia from OHS with pulmonary HTN has been a significant contributor to the patient's heart failure.  Echo in 9/12 with moderate RV dysfunction.  

## 2011-12-27 ENCOUNTER — Encounter: Payer: Self-pay | Admitting: Pulmonary Disease

## 2011-12-27 ENCOUNTER — Ambulatory Visit (INDEPENDENT_AMBULATORY_CARE_PROVIDER_SITE_OTHER): Payer: Medicare Other | Admitting: Pulmonary Disease

## 2011-12-27 VITALS — BP 132/72 | HR 74 | Temp 98.2°F | Ht 74.0 in | Wt 309.0 lb

## 2011-12-27 DIAGNOSIS — I5040 Unspecified combined systolic (congestive) and diastolic (congestive) heart failure: Secondary | ICD-10-CM

## 2011-12-27 DIAGNOSIS — I509 Heart failure, unspecified: Secondary | ICD-10-CM

## 2011-12-27 DIAGNOSIS — E678 Other specified hyperalimentation: Secondary | ICD-10-CM

## 2011-12-27 DIAGNOSIS — J9610 Chronic respiratory failure, unspecified whether with hypoxia or hypercapnia: Secondary | ICD-10-CM

## 2011-12-27 NOTE — Progress Notes (Signed)
  Subjective:    Patient ID: William Braun, male    DOB: 08-20-1933, 78 y.o.   MRN: [id number]  HPI The patient comes in today for followup of his known chronic respiratory failure secondary to obesity hypoventilation syndrome, mild COPD, and biventricular dysfunction.  He is remaining compliant on his bilevel, and is keeping up with mask changes and supplies.  He feels that he sleeps well with the device, and is wearing oxygen compliantly as well.  He has had no significant cough or chest congestion.  He is also following with cardiology closely in order to maintain fluid hemostasis   Review of Systems  Constitutional: Negative for fever and unexpected weight change.  HENT: Positive for congestion, rhinorrhea, trouble swallowing and postnasal drip. Negative for ear pain, nosebleeds, sore throat, sneezing, dental problem and sinus pressure.   Eyes: Positive for redness and itching.  Respiratory: Positive for chest tightness, shortness of breath and wheezing. Negative for cough.   Cardiovascular: Positive for leg swelling. Negative for palpitations.  Gastrointestinal: Negative for nausea and vomiting.  Genitourinary: Negative for dysuria.  Musculoskeletal: Negative for joint swelling.  Skin: Negative for rash.  Neurological: Negative for headaches.  Hematological: Bruises/bleeds easily.  Psychiatric/Behavioral: Negative for dysphoric mood. The patient is not nervous/anxious.        Objective:   Physical Exam Obese male in no acute distress No skin breakdown or pressure necrosis from the CPAP mask Chest with mild basilar crackles, no wheezing Cardiac exam regular rate and rhythm, 2/6 systolic murmur  lower extremities with 1+ edema bilaterally, no cyanosis Alert and oriented, does not appear to be sleepy, moves all 4 extremities.       Assessment & Plan:

## 2011-12-27 NOTE — Assessment & Plan Note (Signed)
The patient is stable from a pulmonary standpoint.  He seemed to have gotten him to a new stable baseline, but he is not improving.  We are doing everything we can from a medical standpoint, but ultimately he is not going to improve until he has significant weight reduction.  We'll continue on his oxygen, bilevel, and also diuretics in order to treat his pulmonary hypertension and biventricular dysfunction.  He is on a very aggressive bronchodilator regimen for his mild obstructive disease.  The only thing left is for him to work aggressively on weight loss.

## 2011-12-27 NOTE — Patient Instructions (Signed)
Continue on current medications for your copd Continue with bipap, keep up with mask changes You must get your weight down in order to improve further.  Keep at it. followup with me in 6mos.

## 2011-12-28 ENCOUNTER — Other Ambulatory Visit (INDEPENDENT_AMBULATORY_CARE_PROVIDER_SITE_OTHER): Payer: Medicare Other

## 2011-12-28 ENCOUNTER — Other Ambulatory Visit: Payer: Self-pay | Admitting: Internal Medicine

## 2011-12-28 DIAGNOSIS — I5030 Unspecified diastolic (congestive) heart failure: Secondary | ICD-10-CM

## 2011-12-28 DIAGNOSIS — I4891 Unspecified atrial fibrillation: Secondary | ICD-10-CM

## 2011-12-28 DIAGNOSIS — R0602 Shortness of breath: Secondary | ICD-10-CM

## 2011-12-28 LAB — BASIC METABOLIC PANEL
Calcium: 9 mg/dL (ref 8.4–10.5)
Creatinine, Ser: 1.5 mg/dL (ref 0.4–1.5)
GFR: 47.55 mL/min — ABNORMAL LOW (ref >60.00)
Sodium: 140 mEq/L (ref 135–145)

## 2011-12-28 LAB — BRAIN NATRIURETIC PEPTIDE: Pro B Natriuretic peptide (BNP): 48 pg/mL (ref 0.0–100.0)

## 2011-12-30 ENCOUNTER — Ambulatory Visit
Admission: RE | Admit: 2011-12-30 | Discharge: 2011-12-30 | Disposition: A | Discharge status: Home / Self Care | Payer: Medicare Other | Source: Ambulatory Visit | Attending: Internal Medicine | Admitting: Internal Medicine

## 2012-06-12 ENCOUNTER — Telehealth: Payer: Self-pay | Admitting: Cardiology

## 2012-06-12 ENCOUNTER — Other Ambulatory Visit: Payer: Self-pay | Admitting: Cardiology

## 2012-06-12 NOTE — Telephone Encounter (Signed)
New Problem:     I called the patient and was unable to reach them. I left a message on their voicemail with my name, the reason I called, the name of his physician, and a number to call back to schedule their appointment. 

## 2012-06-26 ENCOUNTER — Ambulatory Visit: Payer: Medicare Other | Admitting: Pulmonary Disease

## 2012-07-12 ENCOUNTER — Encounter: Payer: Self-pay | Admitting: Pulmonary Disease

## 2012-07-12 ENCOUNTER — Ambulatory Visit (INDEPENDENT_AMBULATORY_CARE_PROVIDER_SITE_OTHER): Payer: Medicare Other | Admitting: Pulmonary Disease

## 2012-07-12 VITALS — BP 130/90 | HR 80 | Temp 97.4°F | Ht 74.0 in | Wt 328.0 lb

## 2012-07-12 DIAGNOSIS — E678 Other specified hyperalimentation: Secondary | ICD-10-CM

## 2012-07-12 DIAGNOSIS — J438 Other emphysema: Secondary | ICD-10-CM

## 2012-07-12 NOTE — Assessment & Plan Note (Signed)
The patient has done well since the last visit from an airways disease standpoint.  He has not had any acute exacerbation, nor episode of bronchitis.  I have asked him to stay on his current medications.

## 2012-07-12 NOTE — Patient Instructions (Addendum)
Continue with bipap at night Stay on your current breathing medications for your mild copd Work on weight loss You must weigh DAILY in order to keep track of your fluid balance.  followup with me in 6mos.

## 2012-07-12 NOTE — Progress Notes (Signed)
  Subjective:    Patient ID: William Braun., male    DOB: Jan 14, 1933, 78 y.o.   MRN: [id number]  HPI Patient comes in today for followup of his known obesity hypoventilation syndrome, mild COPD, and also diastolic heart failure.  He is wearing his BiPAP device compliant, and is doing very well with it.  He is staying on his pulmonary meds, and has had no significant congestion or wheezing.  Unfortunately, he is not weighing daily, and is also splitting up his diuretic dose through the day.  His weight is up 19 pounds from his visit 6 months ago.   Review of Systems  Constitutional: Negative for fever and unexpected weight change.  HENT: Negative for ear pain, nosebleeds, congestion, sore throat, rhinorrhea, sneezing, trouble swallowing, dental problem, postnasal drip and sinus pressure.   Eyes: Negative for redness and itching.  Respiratory: Positive for shortness of breath and wheezing. Negative for cough and chest tightness.   Cardiovascular: Positive for leg swelling. Negative for palpitations.  Gastrointestinal: Negative for nausea and vomiting.  Genitourinary: Negative for dysuria.  Musculoskeletal: Positive for joint swelling.  Skin: Negative for rash.  Neurological: Negative for headaches.  Hematological: Bruises/bleeds easily.  Psychiatric/Behavioral: Negative for dysphoric mood. The patient is not nervous/anxious.        Objective:   Physical Exam Morbidly obese male in no acute distress Nose without purulence or discharge noted No skin breakdown or pressure necrosis from the CPAP mask Chest with mild basilar crackles, otherwise good air flow and no wheezing Cardiac exam with regular rate and rhythm Lower extremities with 1-2+ edema bilaterally, no cyanosis Alert and oriented, moves all 4 extremities.        Assessment & Plan:

## 2012-07-12 NOTE — Assessment & Plan Note (Signed)
The patient is wearing bilevel compliantly, and is doing very well with his device.  He is not having any issues with mask fit or pressure.  I have stressed to him again the importance of aggressive weight loss.

## 2012-09-24 ENCOUNTER — Telehealth: Payer: Self-pay | Admitting: Pulmonary Disease

## 2012-09-24 NOTE — Telephone Encounter (Signed)
I spoke with pt and he wanted to know if he used his rescue inhaler if he still needed to continue to use his other inhalers the same. I advised him yes still continue those the same. Nothing further was needed

## 2012-11-02 ENCOUNTER — Ambulatory Visit: Payer: Medicare Other | Admitting: Pulmonary Disease

## 2012-11-02 ENCOUNTER — Other Ambulatory Visit (INDEPENDENT_AMBULATORY_CARE_PROVIDER_SITE_OTHER): Payer: Self-pay | Admitting: Otolaryngology

## 2012-11-02 DIAGNOSIS — R4702 Dysphasia: Secondary | ICD-10-CM

## 2012-11-06 ENCOUNTER — Other Ambulatory Visit (INDEPENDENT_AMBULATORY_CARE_PROVIDER_SITE_OTHER): Payer: Self-pay | Admitting: Otolaryngology

## 2012-11-06 ENCOUNTER — Ambulatory Visit (HOSPITAL_COMMUNITY)
Admission: RE | Admit: 2012-11-06 | Discharge: 2012-11-06 | Disposition: A | Discharge status: Home / Self Care | Payer: Medicare Other | Source: Ambulatory Visit | Attending: Otolaryngology | Admitting: Otolaryngology

## 2012-11-06 ENCOUNTER — Inpatient Hospital Stay (HOSPITAL_COMMUNITY): Admission: RE | Admit: 2012-11-06 | Payer: Medicare Other | Source: Ambulatory Visit

## 2012-11-06 DIAGNOSIS — R1310 Dysphagia, unspecified: Secondary | ICD-10-CM

## 2012-11-12 ENCOUNTER — Ambulatory Visit (INDEPENDENT_AMBULATORY_CARE_PROVIDER_SITE_OTHER): Payer: Medicare Other | Admitting: Cardiology

## 2012-11-12 ENCOUNTER — Encounter: Payer: Self-pay | Admitting: Cardiology

## 2012-11-12 VITALS — BP 116/76 | HR 75 | Ht 73.0 in | Wt 324.0 lb

## 2012-11-12 DIAGNOSIS — E678 Other specified hyperalimentation: Secondary | ICD-10-CM

## 2012-11-12 DIAGNOSIS — I5032 Chronic diastolic (congestive) heart failure: Secondary | ICD-10-CM

## 2012-11-12 DIAGNOSIS — I509 Heart failure, unspecified: Secondary | ICD-10-CM

## 2012-11-12 DIAGNOSIS — I4891 Unspecified atrial fibrillation: Secondary | ICD-10-CM

## 2012-11-12 MED ORDER — TORSEMIDE 20 MG PO TABS: ORAL_TABLET | ORAL | Status: DC

## 2012-11-12 MED ORDER — POTASSIUM CHLORIDE CRYS ER 20 MEQ PO TBCR: 20.0000 meq | EXTENDED_RELEASE_TABLET | Freq: Every day | ORAL | Status: DC

## 2012-11-12 NOTE — Patient Instructions (Addendum)
Stop lasix(furosemide).   Start torsemide 80mg  daily. This will be 4 of a 20mg  tablet daily at the same time in the morning.  Take KCL(potassium) 20 mEq daily.   Your physician recommends that you return for lab work in: 1 week--BMET.   Your physician has requested that you have an echocardiogram. Echocardiography is a painless test that uses sound waves to create images of your heart. It provides your doctor with information about the size and shape of your heart and how well your heart's chambers and valves are working. This procedure takes approximately one hour. There are no restrictions for this procedure.  Your physician recommends that you schedule a follow-up appointment in: 2 weeks with William Braun. This is scheduled for Wednesday March 12,2014 at 10:30AM.

## 2012-11-13 NOTE — Progress Notes (Signed)
Patient ID: William Rhea., male   DOB: July 26, 1933, 79 y.o.   MRN: [id number] PCP: Dr. Earl Gala  79 yo with h/o HTN, CVA, diabetes, obesity-hypoventilation syndrome, diastolic CHF, and chronic atrial fibrillation presents for cardiology followup.  He has OHS and uses BIPAP at night and oxygen with exertion during the day.  He has atrial fibrillation with reasonable rate control and is on coumadin.  He has CHF that I suspect is primarily right-sided from OHS with resulting pulmonary hypertension.  Since I last saw him, weight is up 14 lbs.  He feels like he is more short of breath than in the past and is only really comfortable at rest  No chest pain.  He is unable to exercise much due to joint pains.     Labs (2/10): creatinine 1.15, BNP 73, TSH normal  Labs (9/12): K 4.1, creatinine 1.4, BNP 90 Labs (11/12): K 4.2, creatinine 1.9 Labs (2/13): K 4.2, creatinine 1.3, proBNP 49, LDL 39, HDL 35 Labs (4/13): K 4, creatinine 1.5, BNP 48  Allergies (verified):  1) ! Quinine   Past Medical History:  1. Atrial Fibrillation: apparently developed post-op right TKR in 2/10. Pt was started on coumadin. Now chronic.  2. Diabetes Type 2  3. Hyperlipidemia  4. Hypertension  5. Cerebrovascular Disease-CVA-2008  6. Obesity  7. Osteoarthritis left knee, s/p R TKR  8. History of thrombocytopenia  9. Diastolic CHF: Echo (9/12) with EF 55%, moderately dilated RV with moderate RV systolic dysfunction, PA systolic pressure 36 mmHg.  10. Obesity hypoventilation syndrome: BIPAP at night, O2 during the day with exertion. 11. Prolonged hospitalization in fall 2011 with Strep agalactiae bacteremia, MRSA PNA, and septic shock.  12. Lexiscan myoview (11/12) with EF 65%, no ischemia or infarction.   Family History:  Father: deceased MVA  Mother: deceased MVA   Social History:  Married  Tobacco Use - Former. -quit >35 years ago  3 children  Former [employer]  Review of Systems  negative except as per  HPI  Current Outpatient Prescriptions  Medication Sig Dispense Refill  . acetaminophen (TYLENOL) 325 MG tablet Take 650 mg by mouth every 6 (six) hours as needed.        Marland Kitchen albuterol (VENTOLIN HFA) 108 (90 BASE) MCG/ACT inhaler Inhale 2 puffs into the lungs every 6 (six) hours as needed.        Marland Kitchen amLODipine (NORVASC) 5 MG tablet Take 5 mg by mouth daily.        . budesonide-formoterol (SYMBICORT) 160-4.5 MCG/ACT inhaler Inhale 2 puffs into the lungs 2 (two) times daily. 2 puffs twice daily  1 Inhaler  6  . diphenhydrAMINE (BENADRYL) 25 mg capsule Take 25 mg by mouth at bedtime as needed.        Marland Kitchen HYDROcodone-acetaminophen (VICODIN) 5-500 MG per tablet Take 2 tablets by mouth every 6 (six) hours as needed.        . metFORMIN (GLUCOPHAGE) 500 MG tablet 1 tab twice a day      . metoprolol succinate (TOPROL-XL) 25 MG 24 hr tablet Take 1/2 tab by mouth two times a day      . nitroGLYCERIN (NITROSTAT) 0.4 MG SL tablet Place 1 tablet (0.4 mg total) under the tongue every 5 (five) minutes as needed for chest pain.  25 tablet  3  . pantoprazole (PROTONIX) 40 MG tablet Take 1 tablet by mouth daily.      . simvastatin (ZOCOR) 20 MG tablet Take 20 mg by mouth  at bedtime.        . Tamsulosin HCl (FLOMAX) 0.4 MG CAPS Take 1 tablet by mouth daily.      Marland Kitchen tiotropium (SPIRIVA) 18 MCG inhalation capsule Place 18 mcg into inhaler and inhale daily.      Marland Kitchen warfarin (COUMADIN) 3 MG tablet Take 1 1/2 tabs by mouth once a day or as directed.      . potassium chloride SA (K-DUR,KLOR-CON) 20 MEQ tablet Take 1 tablet (20 mEq total) by mouth daily.  30 tablet  3  . torsemide (DEMADEX) 20 MG tablet 4 tablets (total 80mg ) daily at the same time in the morning  120 tablet  3   No current facility-administered medications for this visit.    BP 116/76  Pulse 75  Ht 6\' 1"  (1.854 m)  Wt 324 lb (146.965 kg)  BMI 42.76 kg/m2 General: NAD, obese Neck: Thick, JVP 10 cm (difficult), no thyromegaly or thyroid nodule.  Lungs:  Slight crackles at bases bilaterally.  CV: Nondisplaced PMI.  Heart irregular S1/S2, no S3/S4, 1/6 SEM. 2+ edema to knees bilaterally.  No carotid bruit.  Unable to feel pedal pulses in setting of significant edema. Abdomen: Soft, nontender, no hepatosplenomegaly, no distention.  Neurologic: Alert and oriented x 3.  Psych: Normal affect. Extremities: No clubbing or cyanosis.   Assessment/Plan: 1. CHF: Chronic diastolic CHF probably with a component of pulmonary arterial HTN from OHS/OSA with right heart failure.  He is volume overloaded on exam, weight is up 14 lbs since last appointment around a year ago.  He is minimally mobile, NYHA class III-IV symptoms.  He is limited by a combination of dyspnea and joint pain.  His BNP has not been elevated in the past but this can be a false negative in the setting of obesity.  - Needs more diuresis.  He is already on a fairly high dose of Lasix.  I will stop Lasix and start torsemide 80 mg daily to see if he responds better to this.  Start KCl 20 daily.  He will need a BMET in 1 week.  - Repeat echocardiogram to make sure EF remains preserved and also to assess the RV and PA pressure.  2. OHS/OSA: Continue nocturnal Bipap and oxygen with exertion.  3. Atrial fibrillation: Reasonable rate control on Toprol XL.  Continue coumadin.  4. CKD: Repeat BMET after changing to torsemide.   [name] 11/13/2012 9:37 AM

## 2012-11-14 ENCOUNTER — Ambulatory Visit (INDEPENDENT_AMBULATORY_CARE_PROVIDER_SITE_OTHER): Payer: Medicare Other | Admitting: Pulmonary Disease

## 2012-11-14 ENCOUNTER — Encounter: Payer: Self-pay | Admitting: Pulmonary Disease

## 2012-11-14 ENCOUNTER — Ambulatory Visit (INDEPENDENT_AMBULATORY_CARE_PROVIDER_SITE_OTHER)
Admission: RE | Admit: 2012-11-14 | Discharge: 2012-11-14 | Disposition: A | Discharge status: Home / Self Care | Payer: Medicare Other | Source: Ambulatory Visit | Attending: Pulmonary Disease | Admitting: Pulmonary Disease

## 2012-11-14 VITALS — BP 130/86 | HR 78 | Temp 98.2°F | Ht 72.0 in | Wt 325.0 lb

## 2012-11-14 DIAGNOSIS — J439 Emphysema, unspecified: Secondary | ICD-10-CM

## 2012-11-14 DIAGNOSIS — E678 Other specified hyperalimentation: Secondary | ICD-10-CM

## 2012-11-14 DIAGNOSIS — J438 Other emphysema: Secondary | ICD-10-CM

## 2012-11-14 DIAGNOSIS — J9610 Chronic respiratory failure, unspecified whether with hypoxia or hypercapnia: Secondary | ICD-10-CM

## 2012-11-14 NOTE — Addendum Note (Signed)
Addended by: Caryl Ada on: 11/14/2012 12:32 PM   Modules accepted: Orders

## 2012-11-14 NOTE — Assessment & Plan Note (Addendum)
The patient has increased shortness of breath, and it is unclear whether this is related to volume overload, possibly worsening hypoxemia at night, or some other undiagnosed process.  His history and exam are not consistent with a COPD exacerbation.  I would like to check a chest x-ray today for completeness, and we'll also do overnight oximetry.  The patient feels that his breathing is better since he has had changes in his diuretic regimen, and hopefully this will continue to improve.  My only other thought is whether he could have thromboembolic disease, but he is on chronic anticoagulation.

## 2012-11-14 NOTE — Progress Notes (Signed)
  Subjective:    Patient ID: William Braun., male    DOB: 11/05/1932, 79 y.o.   MRN: [id number]  HPI The patient comes in today for an acute sick visit.  He has known obesity hypoventilation syndrome with chronic respiratory failure, chronic diastolic heart failure with right ventricular issues as well, and finally mild COPD.  Last month or so he has noticed increasing shortness of breath with exertion and also sometimes at rest.  He has been staying on his bilevel compliantly, as well as his oxygen.  He denies any chest congestion or purulent mucus, but had some increased cough.  He has not had wheezing.  He has had some increased lower extremity edema, but his weight is fairly stable from the last visit here.  He was recently seen by his cardiologist and his diuretic regimen was changed.  He thinks he feels better just in the last 48 hours since that time.   Review of Systems  Constitutional: Negative for fever and unexpected weight change.  HENT: Positive for trouble swallowing. Negative for ear pain, nosebleeds, congestion, sore throat, rhinorrhea, sneezing, dental problem, postnasal drip and sinus pressure.   Eyes: Negative for redness and itching.  Respiratory: Positive for cough and shortness of breath. Negative for chest tightness and wheezing.   Cardiovascular: Positive for leg swelling. Negative for palpitations.  Gastrointestinal: Negative for nausea and vomiting.  Genitourinary: Negative for dysuria.  Musculoskeletal: Negative for joint swelling.  Skin: Negative for rash.  Neurological: Negative for headaches.  Hematological: Does not bruise/bleed easily.  Psychiatric/Behavioral: Negative for dysphoric mood. The patient is not nervous/anxious.        Objective:   Physical Exam Morbidly obese male in no acute distress No skin breakdown or pressure necrosis from the CPAP mask Neck without thyromegaly or lymphadenopathy Chest with bibasilar crackles, otherwise clear with good air  movement Cardiac exam with slightly irregular rhythm, 2/6 systolic murmur Lower extremities with 2+ edema, no cyanosis Alert and oriented, moves all 4 extremities.       Assessment & Plan:

## 2012-11-14 NOTE — Patient Instructions (Addendum)
Will check a cxr today, and call you with results. Will check oxygen level overnight, and can make adjustments to your bipap if needed.  Continue working on weight loss Let's give the medication changes by cardiology another week or two to see if things improve.  If not, may have to do further evaluation

## 2012-11-16 ENCOUNTER — Other Ambulatory Visit: Payer: Self-pay | Admitting: Pulmonary Disease

## 2012-11-16 DIAGNOSIS — J9610 Chronic respiratory failure, unspecified whether with hypoxia or hypercapnia: Secondary | ICD-10-CM

## 2012-11-19 ENCOUNTER — Other Ambulatory Visit: Payer: Medicare Other

## 2012-11-21 ENCOUNTER — Other Ambulatory Visit (INDEPENDENT_AMBULATORY_CARE_PROVIDER_SITE_OTHER): Payer: Medicare Other

## 2012-11-21 ENCOUNTER — Ambulatory Visit (HOSPITAL_COMMUNITY): Payer: Medicare Other | Attending: Cardiology | Admitting: Radiology

## 2012-11-21 ENCOUNTER — Other Ambulatory Visit (HOSPITAL_COMMUNITY): Payer: Medicare Other

## 2012-11-21 DIAGNOSIS — I4891 Unspecified atrial fibrillation: Secondary | ICD-10-CM

## 2012-11-21 DIAGNOSIS — I5032 Chronic diastolic (congestive) heart failure: Secondary | ICD-10-CM

## 2012-11-21 DIAGNOSIS — R0989 Other specified symptoms and signs involving the circulatory and respiratory systems: Secondary | ICD-10-CM

## 2012-11-21 LAB — BASIC METABOLIC PANEL
Chloride: 100 mEq/L (ref 96–112)
GFR: 50.9 mL/min — ABNORMAL LOW (ref >60.00)
Potassium: 4 mEq/L (ref 3.5–5.1)
Sodium: 140 mEq/L (ref 135–145)

## 2012-11-21 NOTE — Progress Notes (Signed)
Echocardiogram performed.  

## 2012-11-22 ENCOUNTER — Telehealth: Payer: Self-pay | Admitting: *Deleted

## 2012-11-22 NOTE — Telephone Encounter (Signed)
Patients ONO results have been faxed and placed in your green folder for review.

## 2012-11-23 ENCOUNTER — Inpatient Hospital Stay (HOSPITAL_COMMUNITY): Admission: RE | Admit: 2012-11-23 | Payer: Medicare Other | Source: Ambulatory Visit

## 2012-11-26 NOTE — Telephone Encounter (Signed)
Let pt know that his ONO showed he spent 4 min less than 88% during the night.  This is not very much, but will go ahead and have him increase his oxygen flow to 3 liters with his bipap machine at night only while sleeping.

## 2012-11-27 NOTE — Telephone Encounter (Signed)
Called spoke with patient's spouse Advised of ONO results / recs as stated by [facility] Spouse verbalized her understanding and denied any questions Care Coordination note updated w/ liter flow qhs Will sign off

## 2012-11-28 ENCOUNTER — Encounter: Payer: Self-pay | Admitting: Cardiology

## 2012-11-28 ENCOUNTER — Ambulatory Visit (INDEPENDENT_AMBULATORY_CARE_PROVIDER_SITE_OTHER): Payer: Medicare Other | Admitting: Cardiology

## 2012-11-28 VITALS — BP 116/86 | HR 71 | Ht 72.0 in | Wt 327.0 lb

## 2012-11-28 DIAGNOSIS — I5040 Unspecified combined systolic (congestive) and diastolic (congestive) heart failure: Secondary | ICD-10-CM

## 2012-11-28 DIAGNOSIS — J439 Emphysema, unspecified: Secondary | ICD-10-CM

## 2012-11-28 DIAGNOSIS — E678 Other specified hyperalimentation: Secondary | ICD-10-CM

## 2012-11-28 DIAGNOSIS — J438 Other emphysema: Secondary | ICD-10-CM

## 2012-11-28 DIAGNOSIS — I4891 Unspecified atrial fibrillation: Secondary | ICD-10-CM

## 2012-11-28 DIAGNOSIS — I5032 Chronic diastolic (congestive) heart failure: Secondary | ICD-10-CM

## 2012-11-28 MED ORDER — TORSEMIDE 20 MG PO TABS: ORAL_TABLET | ORAL | Status: DC

## 2012-11-28 NOTE — Progress Notes (Signed)
Patient ID: William Braun., male   DOB: 05-08-1933, 79 y.o.   MRN: [id number] PCP: Dr. Earl Gala  79 yo with h/o HTN, CVA, diabetes, obesity-hypoventilation syndrome, diastolic CHF, and chronic atrial fibrillation presents for cardiology followup.  He has OHS and uses BIPAP at night and oxygen with exertion during the day.  He has atrial fibrillation with reasonable rate control and is on coumadin.  He has CHF that I suspect is primarily right-sided from OHS with resulting pulmonary hypertension.  At last appointment, weight was up and he looked volume overloaded.  I changed his diuretic to torsemide 80 mg daily.  He states that his leg edema is much improved, but he has not noted a change in his exertional dyspnea.  He is still short of breath walking 20-30 feet.  Echo done this month showed that EF is still normal with moderate LVH, moderate RV dilation with normal function, moderate pulmonary HTN.   Labs (2/10): creatinine 1.15, BNP 73, TSH normal  Labs (9/12): K 4.1, creatinine 1.4, BNP 90 Labs (11/12): K 4.2, creatinine 1.9 Labs (2/13): K 4.2, creatinine 1.3, proBNP 49, LDL 39, HDL 35 Labs (4/13): K 4, creatinine 1.5, BNP 48 Labs (3/14): K 4, creatinine 1.4  Allergies (verified):  1) ! Quinine   Past Medical History:  1. Atrial Fibrillation: apparently developed post-op right TKR in 2/10. Pt was started on coumadin. Now chronic.  2. Diabetes Type 2  3. Hyperlipidemia  4. Hypertension  5. Cerebrovascular Disease-CVA-2008  6. Obesity  7. Osteoarthritis left knee, s/p R TKR  8. History of thrombocytopenia  9. Diastolic CHF: Echo (9/12) with EF 55%, moderately dilated RV with moderate RV systolic dysfunction, PA systolic pressure 36 mmHg.  Echo (3/14) with EF 60-65%, moderate LVH, moderate RV dilation with normal systolic function, PA systolic pressure 51-55 mmHg.  10. Obesity hypoventilation syndrome: BIPAP at night, O2 during the day with exertion. 11. Prolonged hospitalization in fall 2011  with Strep agalactiae bacteremia, MRSA PNA, and septic shock.  12. Lexiscan myoview (11/12) with EF 65%, no ischemia or infarction.   Family History:  Father: deceased MVA  Mother: deceased MVA   Social History:  Married  Tobacco Use - Former. -quit >35 years ago  3 children  Former [employer]  Review of Systems  negative except as per HPI  Current Outpatient Prescriptions  Medication Sig Dispense Refill  . acetaminophen (TYLENOL) 325 MG tablet Take 650 mg by mouth every 6 (six) hours as needed.        Marland Kitchen albuterol (VENTOLIN HFA) 108 (90 BASE) MCG/ACT inhaler Inhale 2 puffs into the lungs every 6 (six) hours as needed.        Marland Kitchen amLODipine (NORVASC) 5 MG tablet Take 5 mg by mouth daily.        . budesonide-formoterol (SYMBICORT) 160-4.5 MCG/ACT inhaler Inhale 2 puffs into the lungs 2 (two) times daily. 2 puffs twice daily  1 Inhaler  6  . diphenhydrAMINE (BENADRYL) 25 mg capsule Take 25 mg by mouth at bedtime as needed.        Marland Kitchen HYDROcodone-acetaminophen (VICODIN) 5-500 MG per tablet Take 2 tablets by mouth every 6 (six) hours as needed.        . metFORMIN (GLUCOPHAGE) 500 MG tablet 1 tab twice a day      . metoprolol succinate (TOPROL-XL) 25 MG 24 hr tablet Take 1/2 tab by mouth two times a day      . nitroGLYCERIN (NITROSTAT) 0.4 MG SL  tablet Place 1 tablet (0.4 mg total) under the tongue every 5 (five) minutes as needed for chest pain.  25 tablet  3  . pantoprazole (PROTONIX) 40 MG tablet Take 1 tablet by mouth daily.      . potassium chloride SA (K-DUR,KLOR-CON) 20 MEQ tablet Take 1 tablet (20 mEq total) by mouth daily.  30 tablet  3  . simvastatin (ZOCOR) 20 MG tablet Take 20 mg by mouth at bedtime.        . Tamsulosin HCl (FLOMAX) 0.4 MG CAPS Take 1 tablet by mouth daily.      Marland Kitchen tiotropium (SPIRIVA) 18 MCG inhalation capsule Place 18 mcg into inhaler and inhale daily.      Marland Kitchen torsemide (DEMADEX) 20 MG tablet 5 tablets (total 100mg ) daily at the same time in the morning  150  tablet  3  . warfarin (COUMADIN) 3 MG tablet Take 1 1/2 tabs by mouth once a day or as directed.       No current facility-administered medications for this visit.    BP 116/86  Pulse 71  Ht 6' (1.829 m)  Wt 327 lb (148.326 kg)  BMI 44.34 kg/m2  SpO2 92% General: NAD, obese Neck: Thick, JVP 8-9 cm (difficult), no thyromegaly or thyroid nodule.  Lungs: Slight crackles at bases bilaterally.  CV: Nondisplaced PMI.  Heart irregular S1/S2, no S3/S4, 1/6 SEM. 1+ edema 1/2 to knees bilaterally (improved).  No carotid bruit.  Unable to feel pedal pulses in setting of significant edema. Abdomen: Soft, nontender, no hepatosplenomegaly, no distention.  Neurologic: Alert and oriented x 3.  Psych: Normal affect. Extremities: No clubbing or cyanosis.   Assessment/Plan: 1. CHF: Chronic diastolic CHF probably with a component of pulmonary arterial HTN from OHS/OSA with right heart failure.  He remains volume overloaded but is improved compared to prior appointment.  He is minimally mobile, NYHA class III-IV symptoms.  He is limited by a combination of dyspnea and joint pain.  His BNP has not been elevated in the past but this can be a false negative in the setting of obesity.  - Needs more diuresis.  I will increase torsemide to 100 mg daily today.  BMET in 10 days.  2. OHS/OSA: Continue nocturnal Bipap and oxygen with exertion.  3. Atrial fibrillation: Reasonable rate control on Toprol XL.  Continue coumadin.  4. CKD: Repeat BMET after changing to torsemide.   Followup in 6 wks.   [name] 11/28/2012

## 2012-11-28 NOTE — Patient Instructions (Addendum)
Your physician has recommended you make the following change in your medication: Increase your Torsemide to 100 mg per day. Take 5  20 mg pills AT THE SAME TIME every morning.  Your physician recommends that you return for lab work in: 10 days for BNP, BMET  Your physician recommends that you schedule a follow-up appointment in: 6 weeks with Dr. Shirlee Latch.

## 2012-11-29 ENCOUNTER — Ambulatory Visit (HOSPITAL_COMMUNITY)
Admission: RE | Admit: 2012-11-29 | Discharge: 2012-11-29 | Disposition: A | Discharge status: Home / Self Care | Payer: Medicare Other | Source: Ambulatory Visit | Attending: Pulmonary Disease | Admitting: Pulmonary Disease

## 2012-11-29 DIAGNOSIS — R0602 Shortness of breath: Secondary | ICD-10-CM | POA: Insufficient documentation

## 2012-11-29 DIAGNOSIS — J9610 Chronic respiratory failure, unspecified whether with hypoxia or hypercapnia: Secondary | ICD-10-CM

## 2012-11-29 DIAGNOSIS — J986 Disorders of diaphragm: Secondary | ICD-10-CM | POA: Insufficient documentation

## 2012-11-30 ENCOUNTER — Encounter: Payer: Self-pay | Admitting: Pulmonary Disease

## 2012-11-30 DIAGNOSIS — J986 Disorders of diaphragm: Secondary | ICD-10-CM | POA: Insufficient documentation

## 2012-12-05 ENCOUNTER — Other Ambulatory Visit (INDEPENDENT_AMBULATORY_CARE_PROVIDER_SITE_OTHER): Payer: Medicare Other

## 2012-12-05 DIAGNOSIS — I4891 Unspecified atrial fibrillation: Secondary | ICD-10-CM

## 2012-12-05 DIAGNOSIS — I5032 Chronic diastolic (congestive) heart failure: Secondary | ICD-10-CM

## 2012-12-05 LAB — BASIC METABOLIC PANEL
CO2: 32 mEq/L (ref 19–32)
Calcium: 9.2 mg/dL (ref 8.4–10.5)
Chloride: 98 mEq/L (ref 96–112)
Glucose, Bld: 209 mg/dL — ABNORMAL HIGH (ref 70–99)
Potassium: 3.9 mEq/L (ref 3.5–5.1)
Sodium: 139 mEq/L (ref 135–145)

## 2012-12-10 ENCOUNTER — Other Ambulatory Visit: Payer: Medicare Other

## 2012-12-12 ENCOUNTER — Encounter: Payer: Self-pay | Admitting: Pulmonary Disease

## 2012-12-28 ENCOUNTER — Telehealth: Payer: Self-pay | Admitting: Pulmonary Disease

## 2012-12-28 NOTE — Telephone Encounter (Signed)
ATC patient, no answer [id number] 

## 2012-12-31 NOTE — Telephone Encounter (Signed)
Spoke with pt and he states that he called our office by mistake.

## 2013-01-09 ENCOUNTER — Ambulatory Visit (INDEPENDENT_AMBULATORY_CARE_PROVIDER_SITE_OTHER): Payer: Medicare Other | Admitting: Cardiology

## 2013-01-09 VITALS — BP 124/80 | HR 88 | Ht 72.0 in | Wt 326.0 lb

## 2013-01-09 DIAGNOSIS — E678 Other specified hyperalimentation: Secondary | ICD-10-CM

## 2013-01-09 DIAGNOSIS — I509 Heart failure, unspecified: Secondary | ICD-10-CM

## 2013-01-09 DIAGNOSIS — I5032 Chronic diastolic (congestive) heart failure: Secondary | ICD-10-CM

## 2013-01-09 DIAGNOSIS — E785 Hyperlipidemia, unspecified: Secondary | ICD-10-CM

## 2013-01-09 DIAGNOSIS — I4891 Unspecified atrial fibrillation: Secondary | ICD-10-CM

## 2013-01-09 LAB — BASIC METABOLIC PANEL
CO2: 32 mEq/L (ref 19–32)
Chloride: 97 mEq/L (ref 96–112)
GFR: 47.78 mL/min — ABNORMAL LOW (ref >60.00)
Glucose, Bld: 160 mg/dL — ABNORMAL HIGH (ref 70–99)
Potassium: 4 mEq/L (ref 3.5–5.1)
Sodium: 137 mEq/L (ref 135–145)

## 2013-01-09 NOTE — Patient Instructions (Addendum)
Your physician recommends that you have  lab work today--BMET.   Your physician recommends that you schedule a follow-up appointment in: 3 months with Dr Shirlee Latch.

## 2013-01-10 ENCOUNTER — Encounter: Payer: Self-pay | Admitting: Cardiology

## 2013-01-10 ENCOUNTER — Ambulatory Visit (INDEPENDENT_AMBULATORY_CARE_PROVIDER_SITE_OTHER): Payer: Medicare Other | Admitting: Pulmonary Disease

## 2013-01-10 ENCOUNTER — Encounter: Payer: Self-pay | Admitting: Pulmonary Disease

## 2013-01-10 VITALS — BP 124/86 | HR 88 | Temp 98.0°F | Ht 74.0 in | Wt 328.0 lb

## 2013-01-10 DIAGNOSIS — J986 Disorders of diaphragm: Secondary | ICD-10-CM

## 2013-01-10 DIAGNOSIS — J9610 Chronic respiratory failure, unspecified whether with hypoxia or hypercapnia: Secondary | ICD-10-CM

## 2013-01-10 DIAGNOSIS — J438 Other emphysema: Secondary | ICD-10-CM

## 2013-01-10 DIAGNOSIS — E678 Other specified hyperalimentation: Secondary | ICD-10-CM

## 2013-01-10 DIAGNOSIS — J439 Emphysema, unspecified: Secondary | ICD-10-CM

## 2013-01-10 NOTE — Assessment & Plan Note (Signed)
The patient is doing fairly well from a COPD standpoint on excellent medications.  He has good airflow and no wheezing on exam today.  He denies any active airway symptoms.

## 2013-01-10 NOTE — Assessment & Plan Note (Signed)
This is a new diagnosis for the patient, and adds to his multifactorial dyspnea.

## 2013-01-10 NOTE — Patient Instructions (Addendum)
Stay on your current breathing medications and oxygen Stay on your bipap at night Weigh every am and keep a log. Work on weight loss as much as you can. followup with me in 4mos.

## 2013-01-10 NOTE — Progress Notes (Signed)
Patient ID: William Braun., male   DOB: [birth year], 79 y.o.   MRN: [id number] PCP: Dr. Earl Gala  79 yo with h/o HTN, CVA, diabetes, obesity-hypoventilation syndrome, diastolic CHF, and chronic atrial fibrillation presents for cardiology followup.  He has OHS and uses BIPAP at night and oxygen with exertion during the day.  He has atrial fibrillation with reasonable rate control and is on coumadin.  He has CHF that I suspect is primarily right-sided from OHS with resulting pulmonary hypertension.  At last appointment, he continued to look volume overloaded.  I changed his diuretic to torsemide 100 mg daily.  Weight is down 1 lb and leg edema is down as well.  He is still short of breath walking 20-30 feet.  He was found by sniff test to have a paralyzed right hemidiaphragm.    Labs (2/10): creatinine 1.15, BNP 73, TSH normal  Labs (9/12): K 4.1, creatinine 1.4, BNP 90 Labs (11/12): K 4.2, creatinine 1.9 Labs (2/13): K 4.2, creatinine 1.3, proBNP 49, LDL 39, HDL 35 Labs (4/13): K 4, creatinine 1.5, BNP 48 Labs (3/14): K 4 => 3.9, creatinine 1.4 => 1.6  ECG: atrial fibrillation, rate 88, LAFB, RBBB  Allergies (verified):  1) ! Quinine   Past Medical History:  1. Atrial Fibrillation: apparently developed post-op right TKR in 2/10. Pt was started on coumadin. Now chronic.  2. Diabetes Type 2  3. Hyperlipidemia  4. Hypertension  5. Cerebrovascular Disease-CVA-2008  6. Obesity  7. Osteoarthritis left knee, s/p R TKR  8. History of thrombocytopenia  9. Diastolic CHF: Echo (9/12) with EF 55%, moderately dilated RV with moderate RV systolic dysfunction, PA systolic pressure 36 mmHg.  Echo (3/14) with EF 60-65%, moderate LVH, moderate RV dilation with normal systolic function, PA systolic pressure 51-55 mmHg.  10. Obesity hypoventilation syndrome/OSA: BIPAP at night, O2 during the day with exertion. 11. Prolonged hospitalization in fall 2011 with Strep agalactiae bacteremia, MRSA PNA, and septic  shock.  12. Lexiscan myoview (11/12) with EF 65%, no ischemia or infarction.  13. 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   Family History:  Father: deceased MVA  Mother: deceased MVA   Social History:  Married  Tobacco Use - Former. -quit >35 years ago  3 children  Former [employer]  Current Outpatient Prescriptions  Medication Sig Dispense Refill  . acetaminophen (TYLENOL) 325 MG tablet Take 650 mg by mouth every 6 (six) hours as needed.        Marland Kitchen albuterol (VENTOLIN HFA) 108 (90 BASE) MCG/ACT inhaler Inhale 2 puffs into the lungs every 6 (six) hours as needed.        Marland Kitchen amLODipine (NORVASC) 5 MG tablet Take 5 mg by mouth daily.        . budesonide-formoterol (SYMBICORT) 160-4.5 MCG/ACT inhaler Inhale 2 puffs into the lungs 2 (two) times daily. 2 puffs twice daily  1 Inhaler  6  . diphenhydrAMINE (BENADRYL) 25 mg capsule Take 25 mg by mouth at bedtime as needed.        Marland Kitchen glimepiride (AMARYL) 1 MG tablet Take 1 tablet by mouth daily.      Marland Kitchen HYDROcodone-acetaminophen (VICODIN) 5-500 MG per tablet Take 2 tablets by mouth every 6 (six) hours as needed.        . metFORMIN (GLUCOPHAGE) 500 MG tablet 1 tab twice a day      . metoprolol succinate (TOPROL-XL) 25 MG 24 hr tablet Take 1/2 tab by mouth two times a day      .  nitroGLYCERIN (NITROSTAT) 0.4 MG SL tablet Place 1 tablet (0.4 mg total) under the tongue every 5 (five) minutes as needed for chest pain.  25 tablet  3  . pantoprazole (PROTONIX) 40 MG tablet Take 1 tablet by mouth daily.      . potassium chloride SA (K-DUR,KLOR-CON) 20 MEQ tablet Take 1 tablet (20 mEq total) by mouth daily.  30 tablet  3  . simvastatin (ZOCOR) 20 MG tablet Take 20 mg by mouth at bedtime.        . Tamsulosin HCl (FLOMAX) 0.4 MG CAPS Take 1 tablet by mouth daily.      Marland Kitchen tiotropium (SPIRIVA) 18 MCG inhalation capsule Place 18 mcg into inhaler and inhale daily.      Marland Kitchen torsemide (DEMADEX) 20 MG tablet 5 tablets (total 100mg ) daily at the same time in  the morning  150 tablet  3  . warfarin (COUMADIN) 3 MG tablet Take 1 1/2 tabs by mouth once a day or as directed.       No current facility-administered medications for this visit.    BP 124/80  Pulse 88  Ht 6' (1.829 m)  Wt 326 lb (147.873 kg)  BMI 44.2 kg/m2 General: NAD, obese Neck: Thick, JVP 7 cm (difficult), no thyromegaly or thyroid nodule.  Lungs: Slight crackles at bases bilaterally.  CV: Nondisplaced PMI.  Heart irregular S1/S2, no S3/S4, 1/6 SEM. 1+ edema 1/3 to knees bilaterally (improved).  No carotid bruit.  Unable to feel pedal pulses in setting of significant edema. Abdomen: Soft, nontender, no hepatosplenomegaly, no distention.  Neurologic: Alert and oriented x 3.  Psych: Normal affect. Extremities: No clubbing or cyanosis.   Assessment/Plan: 1. CHF: Chronic diastolic CHF probably with a component of pulmonary arterial HTN from OHS/OSA with right heart failure.  Volume status appears better but he has not changed much symptomatically.  He is minimally mobile, NYHA class III-IV symptoms.  He is limited by a combination of dyspnea and joint pain.  His BNP has not been elevated in the past but this can be a false negative in the setting of obesity.  - Continue current torsemide regimen.  Will get BMET today.  2. OHS/OSA: Continue nocturnal Bipap and oxygen with exertion.  3. Atrial fibrillation: Reasonable rate control on Toprol XL.  Continue coumadin.  4. Dyspnea: Suspect this is a combination of OHS/OSA, paralyzed right hemidiaphragm, and diastolic CHF.   [name] 01/10/2013

## 2013-01-10 NOTE — Progress Notes (Signed)
  Subjective:    Patient ID: William Braun., male    DOB: [birth year], 79 y.o.   MRN: [id number]  HPI The patient comes in today for followup of his multiple cardiopulmonary issues resulting in chronic dyspnea on exertion.  He has known COPD, obesity hypoventilation syndrome with sleep apnea, diastolic dysfunction, and recently had a sniff test that showed a paralyzed hemidiaphragm.  He has had overnight oximetry that shows adequate oxygen saturations during the night on bilevel with oxygen.  He comes in today where he is continuing to have significant dyspnea on exertion, but denies any airway symptoms.  He has been diuresed aggressively by cardiology, and is probably near his ideal baseline.   Review of Systems  Constitutional: Negative for fever and unexpected weight change.  HENT: Positive for rhinorrhea. Negative for ear pain, nosebleeds, congestion, sore throat, sneezing, trouble swallowing, dental problem, postnasal drip and sinus pressure.   Eyes: Negative for redness and itching.  Respiratory: Positive for shortness of breath. Negative for cough, chest tightness and wheezing.   Cardiovascular: Positive for leg swelling ( treated with increased amts Torsemide). Negative for palpitations.  Gastrointestinal: Negative for nausea and vomiting.  Genitourinary: Negative for dysuria.  Musculoskeletal: Negative for joint swelling.  Skin: Negative for rash.  Neurological: Negative for headaches.  Hematological: Does not bruise/bleed easily.  Psychiatric/Behavioral: Negative for dysphoric mood. The patient is not nervous/anxious.        Objective:   Physical Exam Morbidly obese male in no acute distress Nose without purulence or discharge noted No skin breakdown or pressure necrosis from the CPAP mask Chest with clear breath sounds bilaterally, except for minimal basilar crackles Cardiac exam with controlled ventricular response Lower extremities with 2+ edema, no cyanosis Alert, does not  appear to be sleepy, moves all 4 extremities.       Assessment & Plan:

## 2013-01-10 NOTE — Assessment & Plan Note (Signed)
The patient appears to be treated adequately for this problem, and recheck of overnight oximetry showed adequate saturations overall.  I have explained to him that aggressive weight loss is the only thing that is not being addressed currently.  Otherwise, I think that his dyspnea is multifactorial.

## 2013-01-16 ENCOUNTER — Telehealth: Payer: Self-pay | Admitting: Cardiology

## 2013-01-16 NOTE — Telephone Encounter (Signed)
Torsemide dose of 100mg  daily reviewed with patient.

## 2013-01-16 NOTE — Telephone Encounter (Signed)
New problem   Pt has questions about Torsemide and Furosemide medications he need clarifications on how to take them and if he need to stop taking one of them. Please call pt.

## 2013-01-18 ENCOUNTER — Telehealth: Payer: Self-pay | Admitting: Pulmonary Disease

## 2013-01-18 NOTE — Telephone Encounter (Signed)
I spoke with William Braun and advised her will get message over to [facility] and ask him regarding handicap placard. Please advise KC thanks

## 2013-01-18 NOTE — Telephone Encounter (Signed)
Ok with me 

## 2013-01-21 NOTE — Telephone Encounter (Signed)
Pt aware that forms is ready to pick up--up front for patient to p/u. Nothing further needed.

## 2013-01-21 NOTE — Telephone Encounter (Signed)
i called and made spouse aware we have the forms here in office and will call once KC signs them. She voiced her understanding. She stated if we can't reach them on home call on cell [phone]. Ashtyn placed this in KC green folder. Please advise KC thanks

## 2013-01-21 NOTE — Telephone Encounter (Signed)
Ashtyn do you have the forms? Carron Curie, CMA

## 2013-01-21 NOTE — Telephone Encounter (Signed)
done

## 2013-01-21 NOTE — Telephone Encounter (Signed)
Pt's spouse states they will be dropping off the form to be filled out today.  Would like this done asap.  Would it be possible for pt to p/u at the time of drop off?  Please call at [phone].  Antionette Fairy

## 2013-02-12 ENCOUNTER — Encounter: Payer: Self-pay | Admitting: Cardiology

## 2013-03-27 ENCOUNTER — Other Ambulatory Visit: Payer: Self-pay | Admitting: Cardiology

## 2013-04-23 ENCOUNTER — Ambulatory Visit: Payer: Medicare Other | Admitting: Cardiology

## 2013-04-30 ENCOUNTER — Encounter: Payer: Self-pay | Admitting: Pulmonary Disease

## 2013-04-30 ENCOUNTER — Ambulatory Visit (INDEPENDENT_AMBULATORY_CARE_PROVIDER_SITE_OTHER): Payer: Medicare Other | Admitting: Pulmonary Disease

## 2013-04-30 VITALS — BP 118/82 | HR 94 | Temp 98.1°F | Ht 74.0 in | Wt 331.0 lb

## 2013-04-30 DIAGNOSIS — J986 Disorders of diaphragm: Secondary | ICD-10-CM

## 2013-04-30 DIAGNOSIS — E678 Other specified hyperalimentation: Secondary | ICD-10-CM

## 2013-04-30 DIAGNOSIS — J9610 Chronic respiratory failure, unspecified whether with hypoxia or hypercapnia: Secondary | ICD-10-CM

## 2013-04-30 DIAGNOSIS — J438 Other emphysema: Secondary | ICD-10-CM

## 2013-04-30 DIAGNOSIS — I5040 Unspecified combined systolic (congestive) and diastolic (congestive) heart failure: Secondary | ICD-10-CM

## 2013-04-30 DIAGNOSIS — J439 Emphysema, unspecified: Secondary | ICD-10-CM

## 2013-04-30 NOTE — Assessment & Plan Note (Signed)
The pt is continuing on a maintenance BD regimen.  He has no wheezing on exam today.

## 2013-04-30 NOTE — Progress Notes (Signed)
  Subjective:    Patient ID: William Braun., male    DOB: January 14, 1933, 79 y.o.   MRN: [id number]  HPI The patient comes in today for followup of his chronic respiratory failure secondary to obesity hypoventilation syndrome, COPD, paralyzed hemidiaphragm, and biventricular failure.  He continues to have significant dyspnea on exertion, but unfortunately he is not wearing his oxygen compliantly as I have instructed.  He denies any significant chest congestion or purulent mucus, but does feel that his shortness of breath is slowly getting worse.  His weight is up 3 pounds from last visit, but his lower extremity edema appears to be very similar.  He's been using his inhalers consistently for his mild to moderate COPD.   Review of Systems  Constitutional: Negative for fever and unexpected weight change.  HENT: Positive for congestion and rhinorrhea. Negative for ear pain, nosebleeds, sore throat, sneezing, trouble swallowing, dental problem, postnasal drip and sinus pressure.   Eyes: Negative for redness and itching.  Respiratory: Positive for cough, chest tightness and shortness of breath. Negative for wheezing.   Cardiovascular: Negative for palpitations and leg swelling.  Gastrointestinal: Negative for nausea and vomiting.  Genitourinary: Negative for dysuria.  Musculoskeletal: Negative for joint swelling.  Skin: Negative for rash.  Neurological: Negative for headaches.  Hematological: Does not bruise/bleed easily.  Psychiatric/Behavioral: Negative for dysphoric mood. The patient is not nervous/anxious.        Objective:   Physical Exam Morbidly obese male in no acute distress Nose without purulence or discharge noted No skin breakdown or pressure necrosis from the CPAP mask Chest with decreased breath sounds bilaterally, including bibasilar crackles Cardiac exam with regular rate and rhythm Lower extremities with 2+ edema bilaterally, no cyanosis Alert and oriented, moves all 4  extremities.       Assessment & Plan:

## 2013-04-30 NOTE — Patient Instructions (Addendum)
Stay on oxygen more consistently as we discussed. Work on weight loss No change in breathing medications followup with me in 4 mos.

## 2013-04-30 NOTE — Assessment & Plan Note (Signed)
This is the patient's primary issue, and it is complicated by his hemidiaphragm paralysis.  He is using his positive pressure device completely at night along with oxygen.  However, he is not wearing his oxygen compliantly as we have discussed.  I stressed to him again the importance of more consistent oxygen needs, and how it affects his hemodynamics and volume status.

## 2013-05-08 ENCOUNTER — Encounter: Payer: Self-pay | Admitting: Cardiology

## 2013-05-08 ENCOUNTER — Ambulatory Visit (INDEPENDENT_AMBULATORY_CARE_PROVIDER_SITE_OTHER): Payer: Medicare Other | Admitting: Cardiology

## 2013-05-08 VITALS — BP 112/69 | HR 73 | Ht 74.0 in | Wt 322.0 lb

## 2013-05-08 DIAGNOSIS — I509 Heart failure, unspecified: Secondary | ICD-10-CM

## 2013-05-08 DIAGNOSIS — I5032 Chronic diastolic (congestive) heart failure: Secondary | ICD-10-CM

## 2013-05-08 DIAGNOSIS — E678 Other specified hyperalimentation: Secondary | ICD-10-CM

## 2013-05-08 DIAGNOSIS — I4891 Unspecified atrial fibrillation: Secondary | ICD-10-CM

## 2013-05-08 NOTE — Patient Instructions (Addendum)
Your physician recommends that you return for lab work today--BMET/BNP.  Your physician recommends that you schedule a follow-up appointment in: 4 months with Dr Shirlee Latch.

## 2013-05-09 LAB — BASIC METABOLIC PANEL
Calcium: 9 mg/dL (ref 8.4–10.5)
GFR: 51.67 mL/min — ABNORMAL LOW (ref >60.00)
Glucose, Bld: 178 mg/dL — ABNORMAL HIGH (ref 70–99)
Sodium: 137 mEq/L (ref 135–145)

## 2013-05-09 LAB — BRAIN NATRIURETIC PEPTIDE: Pro B Natriuretic peptide (BNP): 45 pg/mL (ref 0.0–100.0)

## 2013-05-09 NOTE — Progress Notes (Signed)
Patient ID: William Braun., male   DOB: 23-Aug-1933, 79 y.o.   MRN: [id number] PCP: Dr. Earl Gala  79 yo with h/o HTN, CVA, diabetes, obesity-hypoventilation syndrome, diastolic CHF, and chronic atrial fibrillation presents for cardiology followup.  He has OHS and uses BIPAP at night and oxygen with exertion during the day (though he is not particularly compliant with oxygen during the day.  He has atrial fibrillation with reasonable rate control and is on coumadin.  He has CHF that I suspect is primarily right-sided from OHS with resulting pulmonary hypertension.  Weight is down another 4 lbs.  He is still short of breath walking 20-30 feet.    Labs (2/10): creatinine 1.15, BNP 73, TSH normal  Labs (9/12): K 4.1, creatinine 1.4, BNP 90 Labs (11/12): K 4.2, creatinine 1.9 Labs (2/13): K 4.2, creatinine 1.3, proBNP 49, LDL 39, HDL 35 Labs (4/13): K 4, creatinine 1.5, BNP 48 Labs (3/14): K 4 => 3.9, creatinine 1.4 => 1.6 Labs (4/14): K 4, creatinine 1.5  ECG: atrial fibrillation, LAFB  Allergies (verified):  1) ! Quinine   Past Medical History:  1. Atrial Fibrillation: apparently developed post-op right TKR in 2/10. Pt was started on coumadin. Now chronic.  2. Diabetes Type 2  3. Hyperlipidemia  4. Hypertension  5. Cerebrovascular Disease-CVA-2008  6. Obesity  7. Osteoarthritis left knee, s/p R TKR  8. History of thrombocytopenia  9. Diastolic CHF: Echo (9/12) with EF 55%, moderately dilated RV with moderate RV systolic dysfunction, PA systolic pressure 36 mmHg.  Echo (3/14) with EF 60-65%, moderate LVH, moderate RV dilation with normal systolic function, PA systolic pressure 51-55 mmHg.  10. Obesity hypoventilation syndrome/OSA: BIPAP at night, O2 during the day with exertion. 11. Prolonged hospitalization in fall 2011 with Strep agalactiae bacteremia, MRSA PNA, and septic shock.  12. Lexiscan myoview (11/12) with EF 65%, no ischemia or infarction.  13. 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   Family History:  Father: deceased MVA  Mother: deceased MVA   Social History:  Married  Tobacco Use - Former. -quit >35 years ago  3 children  Former Naval architect  Current Outpatient Prescriptions  Medication Sig Dispense Refill  . acetaminophen (TYLENOL) 325 MG tablet Take 650 mg by mouth every 6 (six) hours as needed.        Marland Kitchen albuterol (VENTOLIN HFA) 108 (90 BASE) MCG/ACT inhaler Inhale 2 puffs into the lungs every 6 (six) hours as needed.        Marland Kitchen amLODipine (NORVASC) 5 MG tablet Take 5 mg by mouth daily.        . budesonide-formoterol (SYMBICORT) 160-4.5 MCG/ACT inhaler Inhale 2 puffs into the lungs 2 (two) times daily. 2 puffs twice daily  1 Inhaler  6  . glimepiride (AMARYL) 1 MG tablet Take 1 tablet by mouth daily.      Marland Kitchen HYDROcodone-acetaminophen (VICODIN) 5-500 MG per tablet Take 2 tablets by mouth every 6 (six) hours as needed.        . metFORMIN (GLUCOPHAGE) 500 MG tablet 1 tab twice a day      . metoprolol succinate (TOPROL-XL) 25 MG 24 hr tablet Take 1/2 tab by mouth two times a day      . nitroGLYCERIN (NITROSTAT) 0.4 MG SL tablet Place 1 tablet (0.4 mg total) under the tongue every 5 (five) minutes as needed for chest pain.  25 tablet  3  . pantoprazole (PROTONIX) 40 MG tablet Take 1 tablet by mouth daily.      Marland Kitchen  potassium chloride SA (K-DUR,KLOR-CON) 20 MEQ tablet Take 1 tablet (20 mEq total) by mouth daily.  30 tablet  3  . simvastatin (ZOCOR) 20 MG tablet Take 20 mg by mouth at bedtime.        . Tamsulosin HCl (FLOMAX) 0.4 MG CAPS Take 1 tablet by mouth daily.      Marland Kitchen tiotropium (SPIRIVA) 18 MCG inhalation capsule Place 18 mcg into inhaler and inhale daily.      Marland Kitchen torsemide (DEMADEX) 20 MG tablet TAKE FIVE TABLETS BY MOUTH IN THE MORNING  150 tablet  3  . warfarin (COUMADIN) 3 MG tablet Take 1 1/2 tabs by mouth once a day or as directed.       No current facility-administered medications for this visit.    BP 112/69  Pulse 73  Ht 6\' 2"  (1.88 m)   Wt 146.058 kg (322 lb)  BMI 41.32 kg/m2 General: NAD, obese Neck: Thick, JVP 7 cm (difficult), no thyromegaly or thyroid nodule.  Lungs: Slight crackles at bases bilaterally.  CV: Nondisplaced PMI.  Heart irregular S1/S2, no S3/S4, 1/6 SEM. 1+ edema 1/4 to knees bilaterally (improved).  No carotid bruit.  Unable to feel pedal pulses in setting of significant edema. Abdomen: Soft, nontender, no hepatosplenomegaly, no distention.  Neurologic: Alert and oriented x 3.  Psych: Normal affect. Extremities: No clubbing or cyanosis.   Assessment/Plan: 1. CHF: Chronic diastolic CHF probably with a component of pulmonary arterial HTN from OHS/OSA with right heart failure.  Volume status appears better but he has not changed much symptomatically.  He is minimally mobile, NYHA class III-IV symptoms.  He is limited by a combination of dyspnea and joint pain.  His BNP has not been elevated in the past but this can be a false negative in the setting of obesity. Weight is down. - Continue current torsemide regimen.  Will get BMET today.  2. OHS/OSA: Continue nocturnal Bipap and oxygen with exertion.  3. Atrial fibrillation: Reasonable rate control on Toprol XL.  Continue coumadin.  4. Dyspnea: Suspect this is a combination of OHS/OSA, paralyzed right hemidiaphragm, and diastolic CHF.   [name] 05/09/2013

## 2013-05-13 ENCOUNTER — Ambulatory Visit: Payer: Medicare Other | Admitting: Pulmonary Disease

## 2013-07-01 ENCOUNTER — Telehealth: Payer: Self-pay | Admitting: Pulmonary Disease

## 2013-07-01 NOTE — Telephone Encounter (Signed)
I don't mind seeing him.  He will need 30 minute time slot to allow time for me to review his information.

## 2013-07-01 NOTE — Telephone Encounter (Signed)
[facility] PT line rang several times with no response and no VM WCB

## 2013-07-01 NOTE — Telephone Encounter (Signed)
Ok with me, but there are no magic bullets for his issues.

## 2013-07-01 NOTE — Telephone Encounter (Signed)
I spoke with pt. He does not feel like he saw eye to eye with [facility] he would like to switch to another physician in our dept but does not know who. Dr. Shelle Iron please advise if you are okay with this? thanks

## 2013-07-01 NOTE — Telephone Encounter (Signed)
Please advise Dr. Craige Cotta if you would be willing to accept this pt? thanks

## 2013-07-02 NOTE — Telephone Encounter (Signed)
No dpr on file for [employer] spoke with patient's spouse Myriam Jacobson, advised of VS's response as below Myriam Jacobson okay with this and appt scheduled with VS for first 30-min availability on 11.25.14 @ 11a.  Helen aware she may call to check for cancellations prn. Nothing further needed at this time; will sign off.

## 2013-08-13 ENCOUNTER — Ambulatory Visit (INDEPENDENT_AMBULATORY_CARE_PROVIDER_SITE_OTHER): Payer: Medicare Other | Admitting: Pulmonary Disease

## 2013-08-13 ENCOUNTER — Encounter: Payer: Self-pay | Admitting: Pulmonary Disease

## 2013-08-13 ENCOUNTER — Ambulatory Visit (INDEPENDENT_AMBULATORY_CARE_PROVIDER_SITE_OTHER)
Admission: RE | Admit: 2013-08-13 | Discharge: 2013-08-13 | Disposition: A | Discharge status: Home / Self Care | Payer: Medicare Other | Source: Ambulatory Visit | Attending: Pulmonary Disease | Admitting: Pulmonary Disease

## 2013-08-13 VITALS — BP 130/84 | HR 74 | Temp 98.1°F | Ht 73.0 in | Wt 315.0 lb

## 2013-08-13 DIAGNOSIS — E662 Morbid (severe) obesity with alveolar hypoventilation: Secondary | ICD-10-CM

## 2013-08-13 DIAGNOSIS — J9610 Chronic respiratory failure, unspecified whether with hypoxia or hypercapnia: Secondary | ICD-10-CM

## 2013-08-13 DIAGNOSIS — E678 Other specified hyperalimentation: Secondary | ICD-10-CM

## 2013-08-13 DIAGNOSIS — J439 Emphysema, unspecified: Secondary | ICD-10-CM

## 2013-08-13 DIAGNOSIS — R0609 Other forms of dyspnea: Secondary | ICD-10-CM

## 2013-08-13 DIAGNOSIS — R0600 Dyspnea, unspecified: Secondary | ICD-10-CM

## 2013-08-13 DIAGNOSIS — J986 Disorders of diaphragm: Secondary | ICD-10-CM

## 2013-08-13 DIAGNOSIS — R0989 Other specified symptoms and signs involving the circulatory and respiratory systems: Secondary | ICD-10-CM

## 2013-08-13 DIAGNOSIS — J438 Other emphysema: Secondary | ICD-10-CM

## 2013-08-13 NOTE — Assessment & Plan Note (Signed)
He has prior history of smoking.  Previous PFT showed mild obstruction.  He does not notice improvement with inhaler therapy.  Will repeat PFT to further assess status of COPD.  He is to continue current regimen of spiriva, symbicort, and prn ventolin for now.

## 2013-08-13 NOTE — Assessment & Plan Note (Signed)
Explained how his weight is contributing to his breathing difficulties.

## 2013-08-13 NOTE — Patient Instructions (Addendum)
Will get report from BiPAP machine Chest xray today Will arrange for breathing test (PFT) Will increase oxygen to 4 liters with exertion >> continue 2 liters oxygen at rest and with BiPAP at night Will arrange for portable pulse oximeter to monitor oxygen level at home Follow up in 6 weeks

## 2013-08-13 NOTE — Progress Notes (Signed)
Chief Complaint  Patient presents with  . COPD    Previously saw KC, switching to VS. Breathing is unchanged. Reports SOB, chest tightness, coughing. Denies wheezing.    History of Present Illness: William Braun. is a 79 y.o. male former smoker with COPD, OSA/OHS, Rt hemidiaphragm paralysis, and chronic respiratory failure.  He was previously seen by Dr. Shelle Iron.  He continues to have trouble with his breathing.  This happens with any type of activity.  He will recover a few minutes after resting.  As a result he is not very active.  He gets phlegm in his throat and chest, but it is hard for him to bring the phlegm up.  When he does expectorate it is yellow/gray color.  He denies hemoptysis.  He does get sinus congestion, and has to blow his nose a lot.  He has been using saline nasal spray, and this helps.  He denies wheezing.  There is no history of allergies.  He was having trouble with leg swelling, but this has gotten better.  He denies history of leg or lung clots.  He has been using BiPAP with 2 liters oxygen.  He uses [facility] for his DME.  He uses symbicort 2 puffs bid currently.  He was only using it once per day before >> he is not sure if increased frequency of symbicort has helped.  He is also using spiriva, but not sure this helps much either.  He does not use ventolin.  He did have trouble with thrush from symbicort.  He started smoking at age 72.  He smoked 2 packs per day, and quit in 1980.  He worked as a Curator on automobiles and planes.  He is from [place].  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  He has 3 dogs.  There is no history of pneumonia, or exposure to tuberculosis.  TESTS: PFT 2011 >> FEV1 2.04 (66%), FEV1% 65 SNIFF 11/16/12 >> Rt hemidiaphragm paralysis ONO on BiPAP and 2 liters 11/19/12 >> test time 7 hrs 41 min.  Mean SpO2 93%, low SpO2 64%.  Spent 4 min 12 sec < 88% Echo 11/21/12 >>  Mod LVH, EF 60 to 65%, mod LA dilation, mod RV dilation,  PAS 55 mmHg  Randalyn Rhea.  has a past medical history of Other and unspecified hyperlipidemia; Unspecified essential hypertension; Type II or unspecified type diabetes mellitus without mention of complication, not stated as uncontrolled; Osteoarthritis; Obesity; History of thrombocytopenia; Cerebrovascular disease; and Atrial fibrillation.  Randalyn Rhea.  has past surgical history that includes Knee arthroscopy w/ allograft impant and Vasectomy.  Prior to Admission medications   Medication Sig Start Date End Date Taking? Authorizing Provider  acetaminophen (TYLENOL) 325 MG tablet Take 650 mg by mouth every 6 (six) hours as needed.     Yes Historical Provider, MD  albuterol (VENTOLIN HFA) 108 (90 BASE) MCG/ACT inhaler Inhale 2 puffs into the lungs every 6 (six) hours as needed.     Yes Historical Provider, MD  amLODipine (NORVASC) 5 MG tablet Take 5 mg by mouth daily.     Yes Historical Provider, MD  budesonide-formoterol (SYMBICORT) 160-4.5 MCG/ACT inhaler Inhale 2 puffs into the lungs 2 (two) times daily. 2 puffs twice daily 06/09/11  Yes Barbaraann Share, MD  glimepiride (AMARYL) 1 MG tablet Take 1 tablet by mouth daily. 12/10/12  Yes Historical Provider, MD  HYDROcodone-acetaminophen (VICODIN) 5-500 MG per tablet Take 2  tablets by mouth every 6 (six) hours as needed.     Yes Historical Provider, MD  metFORMIN (GLUCOPHAGE) 500 MG tablet 1 tab twice a day 07/20/11  Yes Historical Provider, MD  metoprolol succinate (TOPROL-XL) 25 MG 24 hr tablet Take 1/2 tab by mouth two times a day   Yes Historical Provider, MD  nitroGLYCERIN (NITROSTAT) 0.4 MG SL tablet Place 1 tablet (0.4 mg total) under the tongue every 5 (five) minutes as needed for chest pain. 08/03/11  Yes Scott T Alben Spittle, PA-C  pantoprazole (PROTONIX) 40 MG tablet Take 1 tablet by mouth daily. 01/19/11  Yes Historical Provider, MD  potassium chloride SA (K-DUR,KLOR-CON) 20 MEQ tablet Take 1 tablet (20 mEq total) by mouth daily. 11/12/12  Yes  Laurey Morale, MD  simvastatin (ZOCOR) 20 MG tablet Take 20 mg by mouth at bedtime.     Yes Historical Provider, MD  Tamsulosin HCl (FLOMAX) 0.4 MG CAPS Take 1 tablet by mouth daily. 12/12/11  Yes Historical Provider, MD  tiotropium (SPIRIVA) 18 MCG inhalation capsule Place 18 mcg into inhaler and inhale daily.   Yes Historical Provider, MD  torsemide (DEMADEX) 20 MG tablet TAKE FIVE TABLETS BY MOUTH IN THE MORNING 03/27/13  Yes Laurey Morale, MD  warfarin (COUMADIN) 3 MG tablet Take 1 1/2 tabs by mouth once a day or as directed.   Yes Historical Provider, MD    Allergies  Allergen Reactions  . Quinine     Review of Systems  Constitutional: Negative for fever, chills, diaphoresis, activity change, appetite change, fatigue and unexpected weight change.  HENT: Negative for congestion, dental problem, ear discharge, ear pain, facial swelling, hearing loss, mouth sores, nosebleeds, postnasal drip, rhinorrhea, sinus pressure, sneezing, sore throat, tinnitus, trouble swallowing and voice change.   Eyes: Negative for photophobia, discharge, itching and visual disturbance.  Respiratory: Positive for cough, chest tightness and shortness of breath. Negative for apnea, choking, wheezing and stridor.   Cardiovascular: Negative for chest pain, palpitations and leg swelling.  Gastrointestinal: Negative for nausea, vomiting, abdominal pain, constipation, blood in stool and abdominal distention.  Genitourinary: Negative for dysuria, urgency, frequency, hematuria, flank pain, decreased urine volume and difficulty urinating.  Musculoskeletal: Negative for arthralgias, back pain, gait problem, joint swelling, myalgias, neck pain and neck stiffness.  Skin: Negative for color change, pallor and rash.  Neurological: Negative for dizziness, tremors, seizures, syncope, speech difficulty, weakness, light-headedness, numbness and headaches.  Hematological: Negative for adenopathy. Does not bruise/bleed easily.   Psychiatric/Behavioral: Negative for confusion, sleep disturbance and agitation. The patient is not nervous/anxious.    Physical Exam:  General - No distress ENT - No sinus tenderness, no oral exudate, no LAN Cardiac - s1s2 regular, no murmur Chest - decreased breath sounds Rt base, no wheeze Back - No focal tenderness Abd - Soft, non-tender Ext - 1+ edema Neuro - Normal strength Skin - No rashes Psych - normal mood, and behavior  Ambulatory oximetry with 2 liters >> desaturation to 87% on lap two  Assessment/Plan:  Coralyn Helling, MD [place] [facility] Pager:  [phone]

## 2013-08-13 NOTE — Assessment & Plan Note (Signed)
This is likely multifactorial >> related to COPD, OSA/OHS with morbid obesity, heart failure, Rt hemidiaphragm paralysis, and deconditioning.  Will do additional testing to ensure that his pulmonary regimen is optimized.  He is followed in heart failure clinic for cardiac disease.  After optimizing his regimen will then likely need to focus on resuming a gradual exercise regimen to improve his stamina.

## 2013-08-13 NOTE — Progress Notes (Deleted)
  Subjective:    Patient ID: William Braun., male    DOB: [birth year], 79 y.o.   MRN: [id number]  HPI    Review of Systems  Constitutional: Negative for fever, chills, diaphoresis, activity change, appetite change, fatigue and unexpected weight change.  HENT: Negative for congestion, dental problem, ear discharge, ear pain, facial swelling, hearing loss, mouth sores, nosebleeds, postnasal drip, rhinorrhea, sinus pressure, sneezing, sore throat, tinnitus, trouble swallowing and voice change.   Eyes: Negative for photophobia, discharge, itching and visual disturbance.  Respiratory: Positive for cough, chest tightness and shortness of breath. Negative for apnea, choking, wheezing and stridor.   Cardiovascular: Negative for chest pain, palpitations and leg swelling.  Gastrointestinal: Negative for nausea, vomiting, abdominal pain, constipation, blood in stool and abdominal distention.  Genitourinary: Negative for dysuria, urgency, frequency, hematuria, flank pain, decreased urine volume and difficulty urinating.  Musculoskeletal: Negative for arthralgias, back pain, gait problem, joint swelling, myalgias, neck pain and neck stiffness.  Skin: Negative for color change, pallor and rash.  Neurological: Negative for dizziness, tremors, seizures, syncope, speech difficulty, weakness, light-headedness, numbness and headaches.  Hematological: Negative for adenopathy. Does not bruise/bleed easily.  Psychiatric/Behavioral: Negative for confusion, sleep disturbance and agitation. The patient is not nervous/anxious.        Objective:   Physical Exam        Assessment & Plan:

## 2013-08-13 NOTE — Assessment & Plan Note (Signed)
Will get repeat download from his BiPAP machine, and then determine if he needs adjustment to his set up.

## 2013-08-13 NOTE — Assessment & Plan Note (Addendum)
Secondary to COPD, CHF, OSA/OHS, and Rt hemidiaphragm paralysis.    He does well with 2 liters oxygen at rest.  He had oxygen desaturation with exertion in office today on 2 liters >> will increase to 4 liters with exertion.  His recent ONO on BiPAP and 2 liters was okay >> will continue nocturnal set up for now.

## 2013-08-13 NOTE — Assessment & Plan Note (Signed)
Continue supplemental oxygen and BiPAP at night.  Will repeat CXR.

## 2013-08-14 ENCOUNTER — Telehealth: Payer: Self-pay | Admitting: Pulmonary Disease

## 2013-08-14 NOTE — Telephone Encounter (Signed)
Pts wife is aware of results.

## 2013-08-14 NOTE — Telephone Encounter (Signed)
08/13/2013    CLINICAL DATA:  Dyspnea   EXAM: CHEST  2 VIEW   COMPARISON:  11/14/2012   FINDINGS:  There is persistent marked elevation right hemidiaphragm. The cardiac silhouette is moderately enlarged. No focal regions of consolidation or focal infiltrates appreciated. Visualized osseous structures unremarkable.   IMPRESSION:  1. No evidence of acute cardiopulmonary disease  2. Persistent elevation right hemidiaphragm.    Electronically Signed   By: Salome Holmes M.D.   On: 08/13/2013 14:05   Will have my nurse inform pt that CXR shows chronic changes from elevated right diaphragm, and heart failure.  No new or worrisome findings.  No change to current treatment plan.

## 2013-08-28 ENCOUNTER — Ambulatory Visit: Payer: Medicare Other | Admitting: Pulmonary Disease

## 2013-08-30 ENCOUNTER — Telehealth: Payer: Self-pay | Admitting: Pulmonary Disease

## 2013-08-30 NOTE — Telephone Encounter (Signed)
Pt is aware of BiPAP report results. 

## 2013-08-30 NOTE — Telephone Encounter (Signed)
BiPAP 02/21/13 to 08/18/13 >> Used on 179 of 179 nights with average 9 hrs 56 min.  Average AHI 1.9 with BiPAP 14/10 cm H2O.  Will have my nurse inform pt that BiPAP reports looks good.  No change to current set up needed.

## 2013-09-05 ENCOUNTER — Ambulatory Visit (INDEPENDENT_AMBULATORY_CARE_PROVIDER_SITE_OTHER): Payer: Medicare Other | Admitting: Cardiology

## 2013-09-05 ENCOUNTER — Encounter: Payer: Self-pay | Admitting: Cardiology

## 2013-09-05 VITALS — BP 124/72 | HR 74 | Ht 74.0 in | Wt 313.0 lb

## 2013-09-05 DIAGNOSIS — I4891 Unspecified atrial fibrillation: Secondary | ICD-10-CM

## 2013-09-05 DIAGNOSIS — I509 Heart failure, unspecified: Secondary | ICD-10-CM

## 2013-09-05 DIAGNOSIS — E678 Other specified hyperalimentation: Secondary | ICD-10-CM

## 2013-09-05 DIAGNOSIS — I5032 Chronic diastolic (congestive) heart failure: Secondary | ICD-10-CM

## 2013-09-05 NOTE — Patient Instructions (Signed)
Your physician wants you to follow-up in: 4 months with Dr Shirlee Latch in the [facility]. (April 2015).  You will receive a reminder letter in the mail two months in advance. If you don't receive a letter, please call our office to schedule the follow-up appointment.

## 2013-09-06 NOTE — Progress Notes (Signed)
Patient ID: William Braun., male   DOB: 1933-05-27, 79 y.o.   MRN: [id number] PCP: Dr. Earl Gala  79 yo with h/o HTN, CVA, diabetes, obesity-hypoventilation syndrome, diastolic CHF, and chronic atrial fibrillation presents for cardiology followup.  He has OHS and uses BIPAP at night and oxygen with exertion during the day.  He has atrial fibrillation with reasonable rate control and is on coumadin.  He has CHF that I suspect is primarily right-sided from OHS with resulting pulmonary hypertension.  Weight is down another 9 lbs.  He is short of breath after walking about 50 feet but thinks there has been some improvement since he started using Symbicort twice a day.   Labs (2/10): creatinine 1.15, BNP 73, TSH normal  Labs (9/12): K 4.1, creatinine 1.4, BNP 90 Labs (11/12): K 4.2, creatinine 1.9 Labs (2/13): K 4.2, creatinine 1.3, proBNP 49, LDL 39, HDL 35 Labs (4/13): K 4, creatinine 1.5, BNP 48 Labs (3/14): K 4 => 3.9, creatinine 1.4 => 1.6 Labs (4/14): K 4, creatinine 1.5  ECG: atrial fibrillation, LAFB, RBBB  Allergies (verified):  1) ! Quinine   Past Medical History:  1. Atrial Fibrillation: apparently developed post-op right TKR in 2/10. Pt was started on coumadin. Now chronic.  2. Diabetes Type 2  3. Hyperlipidemia  4. Hypertension  5. Cerebrovascular Disease-CVA-2008  6. Obesity  7. Osteoarthritis left knee, s/p R TKR  8. History of thrombocytopenia  9. Diastolic CHF: Echo (9/12) with EF 55%, moderately dilated RV with moderate RV systolic dysfunction, PA systolic pressure 36 mmHg.  Echo (3/14) with EF 60-65%, moderate LVH, moderate RV dilation with normal systolic function, PA systolic pressure 51-55 mmHg.  10. Obesity hypoventilation syndrome/OSA: BIPAP at night, O2 during the day with exertion. 11. Prolonged hospitalization in fall 2011 with Strep agalactiae bacteremia, MRSA PNA, and septic shock.  12. Lexiscan myoview (11/12) with EF 65%, no ischemia or infarction.  13. Paralyzed  right hemidiaphragm by sniff test 3/14.   Family History:  Father: deceased MVA  Mother: deceased MVA   Social History:  Married  Tobacco Use - Former. -quit >35 years ago  3 children  Former Naval architect  Current Outpatient Prescriptions  Medication Sig Dispense Refill  . acetaminophen (TYLENOL) 325 MG tablet Take 650 mg by mouth every 6 (six) hours as needed.        Marland Kitchen albuterol (VENTOLIN HFA) 108 (90 BASE) MCG/ACT inhaler Inhale 2 puffs into the lungs every 6 (six) hours as needed.        Marland Kitchen amLODipine (NORVASC) 5 MG tablet Take 5 mg by mouth daily.        . budesonide-formoterol (SYMBICORT) 160-4.5 MCG/ACT inhaler Inhale 2 puffs into the lungs 2 (two) times daily. 2 puffs twice daily  1 Inhaler  6  . glimepiride (AMARYL) 1 MG tablet Take 1 tablet by mouth daily.      Marland Kitchen HYDROcodone-acetaminophen (VICODIN) 5-500 MG per tablet Take 2 tablets by mouth every 6 (six) hours as needed.        . metFORMIN (GLUCOPHAGE) 500 MG tablet 1 tab twice a day      . metoprolol succinate (TOPROL-XL) 25 MG 24 hr tablet Take 1/2 tab by mouth two times a day      . metoprolol tartrate (LOPRESSOR) 25 MG tablet       . nitroGLYCERIN (NITROSTAT) 0.4 MG SL tablet Place 1 tablet (0.4 mg total) under the tongue every 5 (five) minutes as needed for chest pain.  25 tablet  3  . pantoprazole (PROTONIX) 40 MG tablet Take 1 tablet by mouth daily.      . potassium chloride SA (K-DUR,KLOR-CON) 20 MEQ tablet Take 1 tablet (20 mEq total) by mouth daily.  30 tablet  3  . simvastatin (ZOCOR) 20 MG tablet Take 20 mg by mouth at bedtime.        . Tamsulosin HCl (FLOMAX) 0.4 MG CAPS Take 1 tablet by mouth daily.      Marland Kitchen tiotropium (SPIRIVA) 18 MCG inhalation capsule Place 18 mcg into inhaler and inhale daily.      Marland Kitchen torsemide (DEMADEX) 20 MG tablet TAKE FIVE TABLETS BY MOUTH IN THE MORNING  150 tablet  3  . warfarin (COUMADIN) 3 MG tablet Take 1 1/2 tabs by mouth once a day or as directed.       No current  facility-administered medications for this visit.    BP 124/72  Pulse 74  Ht 6\' 2"  (1.88 m)  Wt 141.976 kg (313 lb)  BMI 40.17 kg/m2 General: NAD, obese Neck: Thick, JVP 7 cm (difficult), no thyromegaly or thyroid nodule.  Lungs: Slight crackles at bases bilaterally.  CV: Nondisplaced PMI.  Heart irregular S1/S2, no S3/S4, 2/6 HSM LLSB. 2+ edema 1/2 to knees bilaterally.  No carotid bruit.  Unable to feel pedal pulses in setting of significant edema. Abdomen: Soft, nontender, no hepatosplenomegaly, no distention.  Neurologic: Alert and oriented x 3.  Psych: Normal affect. Extremities: No clubbing or cyanosis.   Assessment/Plan: 1. CHF: Chronic diastolic CHF probably with a component of pulmonary arterial HTN from OHS/OSA with right heart failure.  Volume status appears and he is symptomatically better though still NYHA class IIIb.  He is not very active.  He is limited by a combination of dyspnea and joint pain.  His BNP has not been elevated in the past but this can be a false negative in the setting of obesity. Weight is down again. - Continue current torsemide regimen.   - Had BMET at PCP's office earlier this week, will call for a copy.   2. OHS/OSA: Continue nocturnal Bipap and oxygen with exertion.  3. Atrial fibrillation: Reasonable rate control on Toprol XL.  Continue coumadin.  4. Dyspnea: Suspect this is a combination of OHS/OSA, paralyzed right hemidiaphragm, and diastolic CHF.   [name] 09/06/2013

## 2013-09-25 ENCOUNTER — Telehealth: Payer: Self-pay | Admitting: *Deleted

## 2013-09-25 NOTE — Telephone Encounter (Signed)
Pt's wife called my desk asking what test was pt having on 09/30/13 for insurnace purpose. I told wife pt is having a PFT at 11 am ; f/u w/[name] 12 pm. Wife said thank you.

## 2013-09-27 ENCOUNTER — Other Ambulatory Visit: Payer: Self-pay | Admitting: Pulmonary Disease

## 2013-09-27 DIAGNOSIS — J438 Other emphysema: Secondary | ICD-10-CM | POA: Insufficient documentation

## 2013-09-27 DIAGNOSIS — R0602 Shortness of breath: Secondary | ICD-10-CM

## 2013-09-30 ENCOUNTER — Encounter: Payer: Self-pay | Admitting: Pulmonary Disease

## 2013-09-30 ENCOUNTER — Ambulatory Visit (INDEPENDENT_AMBULATORY_CARE_PROVIDER_SITE_OTHER): Payer: Medicare HMO | Admitting: Pulmonary Disease

## 2013-09-30 VITALS — BP 122/84 | HR 74 | Ht 74.0 in | Wt 310.0 lb

## 2013-09-30 DIAGNOSIS — R0600 Dyspnea, unspecified: Secondary | ICD-10-CM

## 2013-09-30 DIAGNOSIS — J439 Emphysema, unspecified: Secondary | ICD-10-CM

## 2013-09-30 DIAGNOSIS — J986 Disorders of diaphragm: Secondary | ICD-10-CM

## 2013-09-30 DIAGNOSIS — E678 Other specified hyperalimentation: Secondary | ICD-10-CM

## 2013-09-30 DIAGNOSIS — R0989 Other specified symptoms and signs involving the circulatory and respiratory systems: Secondary | ICD-10-CM

## 2013-09-30 DIAGNOSIS — R0609 Other forms of dyspnea: Secondary | ICD-10-CM

## 2013-09-30 DIAGNOSIS — R0602 Shortness of breath: Secondary | ICD-10-CM

## 2013-09-30 DIAGNOSIS — J438 Other emphysema: Secondary | ICD-10-CM

## 2013-09-30 DIAGNOSIS — J9610 Chronic respiratory failure, unspecified whether with hypoxia or hypercapnia: Secondary | ICD-10-CM

## 2013-09-30 NOTE — Progress Notes (Signed)
Chief Complaint  Patient presents with  . Shortness of Breath    Breathing is slightly improved. Still reports SOB, chest tightness and wheezing. Denies coughing.    History of Present Illness: William Braun. is a 80 y.o. male former smoker with COPD, OSA/OHS, Rt hemidiaphragm paralysis, and chronic respiratory failure.  His breathing has been doing better.  He is wearing his oxygen almost 24 hours per day.  He is not having much cough, wheeze, or sputum.  He does not need to use albuterol much.  His leg swelling has decreased, and he has lost about 8 lbs since last visit.  He feels like he is doing better since last visit.  TESTS: PFT 2011 >> FEV1 2.04 (66%), FEV1% 65 SNIFF 11/16/12 >> Rt hemidiaphragm paralysis ONO on BiPAP and 2 liters 11/19/12 >> test time 7 hrs 41 min.  Mean SpO2 93%, low SpO2 64%.  Spent 4 min 12 sec < 88% Echo 11/21/12 >>  Mod LVH, EF 60 to 65%, mod LA dilation, mod RV dilation, PAS 55 mmHg BiPAP 02/21/13 to 08/18/13 >> Used on 179 of 179 nights with average 9 hrs 56 min. Average AHI 1.9 with BiPAP 14/10 cm H2O. PFT 09/30/13 >> FEV1 1.78 (51%), FEV1% 73, TLC 4.67 (59%), DLCO 49%, no BD  William Braun.  has a past medical history of Other and unspecified hyperlipidemia; Unspecified essential hypertension; Type II or unspecified type diabetes mellitus without mention of complication, not stated as uncontrolled; Osteoarthritis; Obesity; History of thrombocytopenia; Cerebrovascular disease; and Atrial fibrillation.  William Braun.  has past surgical history that includes Knee arthroscopy w/ allograft impant and Vasectomy.  Prior to Admission medications   Medication Sig Start Date End Date Taking? Authorizing Provider  acetaminophen (TYLENOL) 325 MG tablet Take 650 mg by mouth every 6 (six) hours as needed.     Yes Historical Provider, MD  albuterol (VENTOLIN HFA) 108 (90 BASE) MCG/ACT inhaler Inhale 2 puffs into the lungs every 6 (six) hours as needed.     Yes Historical  Provider, MD  amLODipine (NORVASC) 5 MG tablet Take 5 mg by mouth daily.     Yes Historical Provider, MD  budesonide-formoterol (SYMBICORT) 160-4.5 MCG/ACT inhaler Inhale 2 puffs into the lungs 2 (two) times daily. 2 puffs twice daily 06/09/11  Yes Kathee Delton, MD  glimepiride (AMARYL) 1 MG tablet Take 1 tablet by mouth daily. 12/10/12  Yes Historical Provider, MD  HYDROcodone-acetaminophen (VICODIN) 5-500 MG per tablet Take 2 tablets by mouth every 6 (six) hours as needed.     Yes Historical Provider, MD  metFORMIN (GLUCOPHAGE) 500 MG tablet 1 tab twice a day 07/20/11  Yes Historical Provider, MD  metoprolol succinate (TOPROL-XL) 25 MG 24 hr tablet Take 1/2 tab by mouth two times a day   Yes Historical Provider, MD  nitroGLYCERIN (NITROSTAT) 0.4 MG SL tablet Place 1 tablet (0.4 mg total) under the tongue every 5 (five) minutes as needed for chest pain. 08/03/11  Yes Scott T Kathlen Mody, PA-C  pantoprazole (PROTONIX) 40 MG tablet Take 1 tablet by mouth daily. 01/19/11  Yes Historical Provider, MD  potassium chloride SA (K-DUR,KLOR-CON) 20 MEQ tablet Take 1 tablet (20 mEq total) by mouth daily. 11/12/12  Yes Larey Dresser, MD  simvastatin (ZOCOR) 20 MG tablet Take 20 mg by mouth at bedtime.     Yes Historical Provider, MD  Tamsulosin HCl (FLOMAX) 0.4 MG CAPS Take 1 tablet by mouth daily. 12/12/11  Yes  Historical Provider, MD  tiotropium (SPIRIVA) 18 MCG inhalation capsule Place 18 mcg into inhaler and inhale daily.   Yes Historical Provider, MD  torsemide (DEMADEX) 20 MG tablet TAKE FIVE TABLETS BY MOUTH IN THE MORNING 03/27/13  Yes Larey Dresser, MD  warfarin (COUMADIN) 3 MG tablet Take 1 1/2 tabs by mouth once a day or as directed.   Yes Historical Provider, MD    Allergies  Allergen Reactions  . Quinine      Physical Exam:  General - No distress, wearing oxygen ENT - No sinus tenderness, no oral exudate, no LAN Cardiac - s1s2 regular, no murmur Chest - decreased breath sounds Rt base, no  wheeze Back - No focal tenderness Abd - Soft, non-tender Ext - 1+ edema Neuro - Normal strength Skin - No rashes Psych - normal mood, and behavior  Assessment/Plan:  Chesley Mires, MD [place] [facility]Critical [facility]/Sleep Pager:  [phone]

## 2013-09-30 NOTE — Patient Instructions (Signed)
Follow up in 4 months 

## 2013-09-30 NOTE — Progress Notes (Signed)
PFT done today. 

## 2013-10-01 NOTE — Assessment & Plan Note (Signed)
He is doing well with current set up of BiPAP and supplemental oxygen.   

## 2013-10-01 NOTE — Assessment & Plan Note (Signed)
Multifactorial >> related to COPD, OSA/OHS with morbid obesity, heart failure, Rt hemidiaphragm paralysis, and deconditioning.  Improved significantly since he has started to use his oxygen more consistently.

## 2013-10-01 NOTE — Assessment & Plan Note (Signed)
PFT today shows mixed obstructive and restrictive defect.  He is stable on his current inhaler regimen of spiriva, symbicort, and prn ventolin.

## 2013-10-01 NOTE — Assessment & Plan Note (Signed)
Secondary to COPD, CHF, OSA/OHS, and Rt hemidiaphragm paralysis.    Continue 2 liters oxygen at rest and with BiPAP at night.  He is to use 4 liters during day with exertion.      

## 2013-10-01 NOTE — Assessment & Plan Note (Signed)
Stable findings on most recent CXR.

## 2014-01-02 ENCOUNTER — Other Ambulatory Visit: Payer: Self-pay

## 2014-01-02 MED ORDER — TORSEMIDE 20 MG PO TABS: ORAL_TABLET | ORAL | Status: DC

## 2014-01-15 ENCOUNTER — Other Ambulatory Visit: Payer: Self-pay

## 2014-01-15 ENCOUNTER — Telehealth: Payer: Self-pay | Admitting: Cardiology

## 2014-01-15 MED ORDER — TORSEMIDE 20 MG PO TABS: ORAL_TABLET | ORAL | Status: DC

## 2014-01-15 NOTE — Telephone Encounter (Deleted)
Error

## 2014-01-23 ENCOUNTER — Ambulatory Visit (INDEPENDENT_AMBULATORY_CARE_PROVIDER_SITE_OTHER): Payer: Medicare HMO | Admitting: Cardiology

## 2014-01-23 ENCOUNTER — Encounter: Payer: Self-pay | Admitting: Cardiology

## 2014-01-23 VITALS — BP 129/73 | HR 83 | Ht 73.0 in | Wt 311.0 lb

## 2014-01-23 DIAGNOSIS — E678 Other specified hyperalimentation: Secondary | ICD-10-CM

## 2014-01-23 DIAGNOSIS — R0609 Other forms of dyspnea: Secondary | ICD-10-CM

## 2014-01-23 DIAGNOSIS — R0600 Dyspnea, unspecified: Secondary | ICD-10-CM

## 2014-01-23 DIAGNOSIS — I509 Heart failure, unspecified: Secondary | ICD-10-CM

## 2014-01-23 DIAGNOSIS — R0989 Other specified symptoms and signs involving the circulatory and respiratory systems: Secondary | ICD-10-CM

## 2014-01-23 DIAGNOSIS — I4891 Unspecified atrial fibrillation: Secondary | ICD-10-CM

## 2014-01-23 DIAGNOSIS — I5032 Chronic diastolic (congestive) heart failure: Secondary | ICD-10-CM

## 2014-01-23 LAB — BASIC METABOLIC PANEL
BUN: 27 mg/dL — ABNORMAL HIGH (ref 6–23)
CALCIUM: 9 mg/dL (ref 8.4–10.5)
CO2: 34 mEq/L — ABNORMAL HIGH (ref 19–32)
Chloride: 99 mEq/L (ref 96–112)
Creatinine, Ser: 1.4 mg/dL (ref 0.4–1.5)
GFR: 52.43 mL/min — ABNORMAL LOW (ref >60.00)
Glucose, Bld: 151 mg/dL — ABNORMAL HIGH (ref 70–99)
POTASSIUM: 4.2 meq/L (ref 3.5–5.1)
Sodium: 139 mEq/L (ref 135–145)

## 2014-01-23 LAB — BRAIN NATRIURETIC PEPTIDE: PRO B NATRI PEPTIDE: 49 pg/mL (ref 0.0–100.0)

## 2014-01-23 MED ORDER — TORSEMIDE 20 MG PO TABS: ORAL_TABLET | ORAL | Status: DC

## 2014-01-23 NOTE — Patient Instructions (Signed)
Increase torsemide to 40mg  in the AM and 20mg  in the PM.This will be 2 of your 20mg  tablets in the AM and 1 of your 20mg  tablets in the PM.  Your physician recommends that you have lab work today--BMET/BNP.  Your physician recommends that you schedule a follow-up appointment in: 1 month in the [facility] at [facility].

## 2014-01-24 NOTE — Progress Notes (Signed)
Patient ID: William Braun., male   DOB: [birth year], 80 y.o.   MRN: [id number] PCP: Dr. Maxwell Caul  80 yo with h/o HTN, CVA, diabetes, obesity-hypoventilation syndrome, diastolic CHF, and chronic atrial fibrillation presents for cardiology followup.  He has OHS and uses BIPAP at night and oxygen with exertion during the day.  He has atrial fibrillation with reasonable rate control and is on coumadin.  He has CHF that I suspect is primarily right-sided from OHS with resulting pulmonary hypertension.  Weight is down another 2 lbs.  He has, however, noted more lower extremity edema.  On occasion, he will take an extra 20 mg torsemide in the evening.  He walks around his house with mild dyspnea.  He uses a scooter outside the house.  No chest pain.    Labs (2/10): creatinine 1.15, BNP 73, TSH normal  Labs (9/12): K 4.1, creatinine 1.4, BNP 90 Labs (11/12): K 4.2, creatinine 1.9 Labs (2/13): K 4.2, creatinine 1.3, proBNP 49, LDL 39, HDL 35 Labs (4/13): K 4, creatinine 1.5, BNP 48 Labs (3/14): K 4 => 3.9, creatinine 1.4 => 1.6 Labs (4/14): K 4, creatinine 1.5  ECG: atrial fibrillation, RBBB  Allergies (verified):  1) ! Quinine   Past Medical History:  1. Atrial Fibrillation: apparently developed post-op right TKR in 2/10. Pt was started on coumadin. Now chronic.  2. Diabetes Type 2  3. Hyperlipidemia  4. Hypertension  5. Cerebrovascular Disease-CVA-2008  6. Obesity  7. Osteoarthritis left knee, s/p R TKR  8. History of thrombocytopenia  9. Diastolic CHF: Echo (2/50) with EF 55%, moderately dilated RV with moderate RV systolic dysfunction, PA systolic pressure 36 mmHg.  Echo (3/14) with EF 60-65%, moderate LVH, moderate RV dilation with normal systolic function, PA systolic pressure 53-97 mmHg.  10. Obesity hypoventilation syndrome/OSA: BIPAP at night, O2 during the day with exertion. PFTs (1/15) with FEV1 49%, ratio 97%, TLC 59%, DLCO 49% => mixed obstructive/restrictive.  11. Prolonged  hospitalization in fall 2011 with Strep agalactiae bacteremia, MRSA PNA, and septic shock.  12. Lexiscan myoview (11/12) with EF 65%, no ischemia or infarction.  13. 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   Family History:  Father: deceased MVA  Mother: deceased MVA   Social History:  Married  Tobacco Use - Former. 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   Current Outpatient Prescriptions  Medication Sig Dispense Refill  . acetaminophen (TYLENOL) 325 MG tablet Take 650 mg by mouth every 6 (six) hours as needed.        Marland Kitchen albuterol (VENTOLIN HFA) 108 (90 BASE) MCG/ACT inhaler Inhale 2 puffs into the lungs every 6 (six) hours as needed.        Marland Kitchen amLODipine (NORVASC) 5 MG tablet Take 5 mg by mouth daily.        . budesonide-formoterol (SYMBICORT) 160-4.5 MCG/ACT inhaler Inhale 2 puffs into the lungs 2 (two) times daily. 2 puffs twice daily  1 Inhaler  6  . glimepiride (AMARYL) 1 MG tablet Take 1 tablet by mouth daily.      Marland Kitchen HYDROcodone-acetaminophen (VICODIN) 5-500 MG per tablet Take 2 tablets by mouth every 6 (six) hours as needed.        . metFORMIN (GLUCOPHAGE) 500 MG tablet 1 tab twice a day      . metoprolol succinate (TOPROL-XL) 25 MG 24 hr tablet Take 1/2 tab by mouth two times a  day      . nitroGLYCERIN (NITROSTAT) 0.4 MG SL tablet Place 1 tablet (0.4 mg total) under the tongue every 5 (five) minutes as needed for chest pain.  25 tablet  3  . pantoprazole (PROTONIX) 40 MG tablet Take 1 tablet by mouth daily.      . potassium chloride SA (K-DUR,KLOR-CON) 20 MEQ tablet Take 1 tablet (20 mEq total) by mouth daily.  30 tablet  3  . simvastatin (ZOCOR) 20 MG tablet Take 20 mg by mouth at bedtime.        Marland Kitchen tiotropium (SPIRIVA) 18 MCG inhalation capsule Place 18 mcg into inhaler and inhale daily.      Marland Kitchen warfarin (COUMADIN) 3 MG tablet Take 1 1/2 tabs by mouth once a day or as directed.      . torsemide  (DEMADEX) 20 MG tablet 2 tablets (40mg ) in the AM and 1 tablets (20mg ) in the PM  270 tablet  1   No current facility-administered medications for this visit.    BP 129/73  Pulse 83  Ht 6\' 1"  (1.854 m)  Wt 141.069 kg (311 lb)  BMI 41.04 kg/m2 General: NAD, obese Neck: Thick, JVP 8 cm (difficult), no thyromegaly or thyroid nodule.  Lungs: Slight crackles at bases bilaterally.  CV: Nondisplaced PMI.  Heart irregular S1/S2, no S3/S4, 2/6 HSM LLSB. 2+ edema 1/2 to knees bilaterally.  No carotid bruit.  Unable to feel pedal pulses in setting of significant edema. Abdomen: Soft, nontender, no hepatosplenomegaly, no distention.  Neurologic: Alert and oriented x 3.  Psych: Normal affect. Extremities: No clubbing or cyanosis.   Assessment/Plan: 1. CHF: Chronic diastolic CHF probably with a component of pulmonary arterial HTN from OHS/OSA with right heart failure.  Still NYHA class IIIb with some volume overload.  He is not very active.  He is limited by a combination of dyspnea and joint pain.  His BNP has not been elevated in the past but this can be a false negative in the setting of obesity.  - Increase torsemide to 40 qam, 20 qpm.  - BMET/BNP today - Followup 1 month in CHF clinic.   2. OHS/OSA: Continue nocturnal Bipap and oxygen with exertion.  3. Atrial fibrillation: Reasonable rate control on Toprol XL.  Continue coumadin.  4. Dyspnea: Suspect this is a combination of OHS/OSA, paralyzed right hemidiaphragm, and diastolic CHF.   Larey Dresser 01/24/2014

## 2014-01-29 ENCOUNTER — Other Ambulatory Visit: Payer: Self-pay

## 2014-01-29 DIAGNOSIS — I5032 Chronic diastolic (congestive) heart failure: Secondary | ICD-10-CM

## 2014-01-29 DIAGNOSIS — I4891 Unspecified atrial fibrillation: Secondary | ICD-10-CM

## 2014-01-29 MED ORDER — TORSEMIDE 20 MG PO TABS: ORAL_TABLET | ORAL | Status: DC

## 2014-02-24 ENCOUNTER — Encounter: Payer: Self-pay | Admitting: Pulmonary Disease

## 2014-02-24 ENCOUNTER — Ambulatory Visit (INDEPENDENT_AMBULATORY_CARE_PROVIDER_SITE_OTHER): Payer: Commercial Managed Care - HMO | Admitting: Pulmonary Disease

## 2014-02-24 ENCOUNTER — Ambulatory Visit (HOSPITAL_COMMUNITY): Payer: Medicare HMO

## 2014-02-24 VITALS — BP 128/80 | HR 62 | Temp 97.5°F | Ht 74.0 in | Wt 310.2 lb

## 2014-02-24 DIAGNOSIS — R0609 Other forms of dyspnea: Secondary | ICD-10-CM

## 2014-02-24 DIAGNOSIS — R0989 Other specified symptoms and signs involving the circulatory and respiratory systems: Secondary | ICD-10-CM

## 2014-02-24 DIAGNOSIS — E678 Other specified hyperalimentation: Secondary | ICD-10-CM

## 2014-02-24 DIAGNOSIS — J438 Other emphysema: Secondary | ICD-10-CM

## 2014-02-24 DIAGNOSIS — J439 Emphysema, unspecified: Secondary | ICD-10-CM

## 2014-02-24 DIAGNOSIS — R0600 Dyspnea, unspecified: Secondary | ICD-10-CM

## 2014-02-24 DIAGNOSIS — J9610 Chronic respiratory failure, unspecified whether with hypoxia or hypercapnia: Secondary | ICD-10-CM

## 2014-02-24 NOTE — Assessment & Plan Note (Signed)
He is doing well with current set up of BiPAP and supplemental oxygen.

## 2014-02-24 NOTE — Assessment & Plan Note (Signed)
Multifactorial >> related to COPD, OSA/OHS with morbid obesity, heart failure, Rt hemidiaphragm paralysis, and deconditioning.  Discussed importance of trying to keep up with his exercise activity as tolerated.

## 2014-02-24 NOTE — Patient Instructions (Signed)
Follow up in 6 months 

## 2014-02-24 NOTE — Progress Notes (Signed)
Chief Complaint  Patient presents with  . Follow-up    No change in breathing since last OV-- pt c/o SOB - worse with exertion.     History of Present Illness: William Braun. is a 80 y.o. male former smoker with COPD, OSA/OHS, Rt hemidiaphragm paralysis, and chronic respiratory failure.  His breathing is about the same.  He is getting occasional cough and clear sputum.  He denies wheeze.  He has noticed more trouble with hot weather.  He is not very active.  He gets winded with activity, but recovers after a few minutes.  He is not using albuterol much.  He is doing well with BiPAP.  He uses oxygen 24/7.  TESTS: PFT 2011 >> FEV1 2.04 (66%), FEV1% 65 SNIFF 11/16/12 >> Rt hemidiaphragm paralysis ONO on BiPAP and 2 liters 11/19/12 >> test time 7 hrs 41 min.  Mean SpO2 93%, low SpO2 64%.  Spent 4 min 12 sec < 88% Echo 11/21/12 >>  Mod LVH, EF 60 to 65%, mod LA dilation, mod RV dilation, PAS 55 mmHg BiPAP 02/21/13 to 08/18/13 >> Used on 179 of 179 nights with average 9 hrs 56 min. Average AHI 1.9 with BiPAP 14/10 cm H2O. PFT 09/30/13 >> FEV1 1.78 (51%), FEV1% 73, TLC 4.67 (59%), DLCO 49%, no BD  William Braun.  has a past medical history of Other and unspecified hyperlipidemia; Unspecified essential hypertension; Type II or unspecified type diabetes mellitus without mention of complication, not stated as uncontrolled; Osteoarthritis; Obesity; History of thrombocytopenia; Cerebrovascular disease; and Atrial fibrillation.  William Braun.  has past surgical history that includes Knee arthroscopy w/ allograft impant and Vasectomy.  Prior to Admission medications   Medication Sig Start Date End Date Taking? Authorizing Provider  acetaminophen (TYLENOL) 325 MG tablet Take 650 mg by mouth every 6 (six) hours as needed.     Yes Historical Provider, MD  albuterol (VENTOLIN HFA) 108 (90 BASE) MCG/ACT inhaler Inhale 2 puffs into the lungs every 6 (six) hours as needed.     Yes Historical Provider, MD   amLODipine (NORVASC) 5 MG tablet Take 5 mg by mouth daily.     Yes Historical Provider, MD  budesonide-formoterol (SYMBICORT) 160-4.5 MCG/ACT inhaler Inhale 2 puffs into the lungs 2 (two) times daily. 2 puffs twice daily 06/09/11  Yes Kathee Delton, MD  glimepiride (AMARYL) 1 MG tablet Take 1 tablet by mouth daily. 12/10/12  Yes Historical Provider, MD  HYDROcodone-acetaminophen (VICODIN) 5-500 MG per tablet Take 2 tablets by mouth every 6 (six) hours as needed.     Yes Historical Provider, MD  metFORMIN (GLUCOPHAGE) 500 MG tablet 1 tab twice a day 07/20/11  Yes Historical Provider, MD  metoprolol succinate (TOPROL-XL) 25 MG 24 hr tablet Take 1/2 tab by mouth two times a day   Yes Historical Provider, MD  nitroGLYCERIN (NITROSTAT) 0.4 MG SL tablet Place 1 tablet (0.4 mg total) under the tongue every 5 (five) minutes as needed for chest pain. 08/03/11  Yes Scott T Kathlen Mody, PA-C  pantoprazole (PROTONIX) 40 MG tablet Take 1 tablet by mouth daily. 01/19/11  Yes Historical Provider, MD  potassium chloride SA (K-DUR,KLOR-CON) 20 MEQ tablet Take 1 tablet (20 mEq total) by mouth daily. 11/12/12  Yes Larey Dresser, MD  simvastatin (ZOCOR) 20 MG tablet Take 20 mg by mouth at bedtime.     Yes Historical Provider, MD  Tamsulosin HCl (FLOMAX) 0.4 MG CAPS Take 1 tablet by mouth daily. 12/12/11  Yes Historical Provider, MD  tiotropium (SPIRIVA) 18 MCG inhalation capsule Place 18 mcg into inhaler and inhale daily.   Yes Historical Provider, MD  torsemide (DEMADEX) 20 MG tablet TAKE FIVE TABLETS BY MOUTH IN THE MORNING 03/27/13  Yes Larey Dresser, MD  warfarin (COUMADIN) 3 MG tablet Take 1 1/2 tabs by mouth once a day or as directed.   Yes Historical Provider, MD    Allergies  Allergen Reactions  . Quinine      Physical Exam:  General - No distress, wearing oxygen ENT - No sinus tenderness, no oral exudate, no LAN Cardiac - s1s2 regular, no murmur Chest - decreased breath sounds Rt base, no wheeze Back - No  focal tenderness Abd - Soft, non-tender Ext - 1+ edema Neuro - Normal strength Skin - No rashes Psych - normal mood, and behavior  Assessment/Plan:  Chesley Mires, MD [place] [facility]/Critical [facility]/Sleep Pager:  [phone]

## 2014-02-24 NOTE — Assessment & Plan Note (Signed)
Secondary to COPD, CHF, OSA/OHS, and Rt hemidiaphragm paralysis.    Continue 2 liters oxygen at rest and with BiPAP at night.  He is to use 4 liters during day with exertion.

## 2014-02-24 NOTE — Assessment & Plan Note (Signed)
Stable on spiriva, symbicort, and prn ventolin.

## 2014-02-26 ENCOUNTER — Encounter (HOSPITAL_COMMUNITY): Payer: Self-pay

## 2014-02-26 ENCOUNTER — Ambulatory Visit (HOSPITAL_COMMUNITY)
Admission: RE | Admit: 2014-02-26 | Discharge: 2014-02-26 | Disposition: A | Discharge status: Home / Self Care | Payer: Medicare HMO | Source: Ambulatory Visit | Attending: Internal Medicine | Admitting: Internal Medicine

## 2014-02-26 VITALS — BP 112/66 | HR 81 | Wt 315.4 lb

## 2014-02-26 DIAGNOSIS — N189 Chronic kidney disease, unspecified: Secondary | ICD-10-CM | POA: Insufficient documentation

## 2014-02-26 DIAGNOSIS — G4733 Obstructive sleep apnea (adult) (pediatric): Secondary | ICD-10-CM | POA: Insufficient documentation

## 2014-02-26 DIAGNOSIS — E662 Morbid (severe) obesity with alveolar hypoventilation: Secondary | ICD-10-CM | POA: Insufficient documentation

## 2014-02-26 DIAGNOSIS — Z8673 Personal history of transient ischemic attack (TIA), and cerebral infarction without residual deficits: Secondary | ICD-10-CM | POA: Insufficient documentation

## 2014-02-26 DIAGNOSIS — I129 Hypertensive chronic kidney disease with stage 1 through stage 4 chronic kidney disease, or unspecified chronic kidney disease: Secondary | ICD-10-CM | POA: Insufficient documentation

## 2014-02-26 DIAGNOSIS — Z87891 Personal history of nicotine dependence: Secondary | ICD-10-CM | POA: Insufficient documentation

## 2014-02-26 DIAGNOSIS — E785 Hyperlipidemia, unspecified: Secondary | ICD-10-CM | POA: Insufficient documentation

## 2014-02-26 DIAGNOSIS — I509 Heart failure, unspecified: Secondary | ICD-10-CM

## 2014-02-26 DIAGNOSIS — R0609 Other forms of dyspnea: Secondary | ICD-10-CM | POA: Insufficient documentation

## 2014-02-26 DIAGNOSIS — I5032 Chronic diastolic (congestive) heart failure: Secondary | ICD-10-CM

## 2014-02-26 DIAGNOSIS — Z9989 Dependence on other enabling machines and devices: Secondary | ICD-10-CM | POA: Insufficient documentation

## 2014-02-26 DIAGNOSIS — E678 Other specified hyperalimentation: Secondary | ICD-10-CM

## 2014-02-26 DIAGNOSIS — I4891 Unspecified atrial fibrillation: Secondary | ICD-10-CM

## 2014-02-26 DIAGNOSIS — R0989 Other specified symptoms and signs involving the circulatory and respiratory systems: Secondary | ICD-10-CM | POA: Insufficient documentation

## 2014-02-26 DIAGNOSIS — E119 Type 2 diabetes mellitus without complications: Secondary | ICD-10-CM | POA: Insufficient documentation

## 2014-02-26 DIAGNOSIS — Z7901 Long term (current) use of anticoagulants: Secondary | ICD-10-CM | POA: Insufficient documentation

## 2014-02-26 DIAGNOSIS — M1710 Unilateral primary osteoarthritis, unspecified knee: Secondary | ICD-10-CM | POA: Insufficient documentation

## 2014-02-26 DIAGNOSIS — J9610 Chronic respiratory failure, unspecified whether with hypoxia or hypercapnia: Secondary | ICD-10-CM

## 2014-02-26 MED ORDER — TORSEMIDE 20 MG PO TABS
ORAL_TABLET | ORAL | Status: DC
Start: 2014-02-26 — End: 2014-03-04

## 2014-02-26 NOTE — Patient Instructions (Signed)
INCREASE Torsemide to 40 mg twice a day  Labs needed in 10 days-BMET  Your physician recommends that you schedule a follow-up appointment in: 2 months  Do the following things EVERYDAY: 1) Weigh yourself in the morning before breakfast. Write it down and keep it in a log. 2) Take your medicines as prescribed 3) Eat low salt foods-Limit salt (sodium) to 2000 mg per day.  4) Stay as active as you can everyday 5) Limit all fluids for the day to less than 2 liters 6)

## 2014-02-26 NOTE — Progress Notes (Signed)
Patient ID: William Braun., male   DOB: 1933/08/29, 80 y.o.   MRN: [id number] PCP: Dr. Maxwell Caul  80 yo with h/o HTN, CVA, diabetes, obesity-hypoventilation syndrome, diastolic CHF, and chronic atrial fibrillation presents for cardiology followup.  He has OHS and uses BIPAP at night and oxygen with exertion during the day.  He has atrial fibrillation with reasonable rate control and is on coumadin.  He has CHF that I suspect is primarily right-sided from OHS with resulting pulmonary hypertension.  He walks around his house with mild dyspnea.  He uses a scooter outside the house.  No chest pain.  At last appointment, I thought he looked more volume overloaded.  I increased his torsemide.  He has noted less lower extremity edema and thinks he may be breathing better.  Oxygen saturation was 93% at rest today but 85% with exertion (off oxygen).   Labs (2/10): creatinine 1.15, BNP 73, TSH normal  Labs (9/12): K 4.1, creatinine 1.4, BNP 90 Labs (11/12): K 4.2, creatinine 1.9 Labs (2/13): K 4.2, creatinine 1.3, proBNP 49, LDL 39, HDL 35 Labs (4/13): K 4, creatinine 1.5, BNP 48 Labs (3/14): K 4 => 3.9, creatinine 1.4 => 1.6 Labs (4/14): K 4, creatinine 1.5 Labs (5/15): K 4.2, creatinine 1.4, BNP 49  Allergies (verified):  1) ! Quinine   Past Medical History:  1. Atrial Fibrillation: apparently developed post-op right TKR in 2/10. Pt was started on coumadin. Now chronic.  2. Diabetes Type 2  3. Hyperlipidemia  4. Hypertension  5. Cerebrovascular Disease-CVA-2008  6. Obesity  7. Osteoarthritis left knee, s/p R TKR  8. History of thrombocytopenia  9. Diastolic CHF: Echo (0/93) with EF 55%, moderately dilated RV with moderate RV systolic dysfunction, PA systolic pressure 36 mmHg.  Echo (3/14) with EF 60-65%, moderate LVH, moderate RV dilation with normal systolic function, PA systolic pressure 26-71 mmHg.  10. Obesity hypoventilation syndrome/OSA: BIPAP at night, O2 during the day with exertion. PFTs  (1/15) with FEV1 49%, ratio 97%, TLC 59%, DLCO 49% => mixed obstructive/restrictive.  11. Prolonged hospitalization in fall 2011 with Strep agalactiae bacteremia, MRSA PNA, and septic shock.  12. Lexiscan myoview (11/12) with EF 65%, no ischemia or infarction.  13. 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  33. CKD  Family History:  Father: deceased MVA  Mother: deceased MVA   Social History:  Married  Tobacco Use - Former. 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   Current Outpatient Prescriptions  Medication Sig Dispense Refill  . albuterol (VENTOLIN HFA) 108 (90 BASE) MCG/ACT inhaler Inhale 2 puffs into the lungs every 6 (six) hours as needed.        Marland Kitchen amLODipine (NORVASC) 5 MG tablet Take 5 mg by mouth daily.        . budesonide-formoterol (SYMBICORT) 160-4.5 MCG/ACT inhaler Inhale 2 puffs into the lungs 2 (two) times daily. 2 puffs twice daily  1 Inhaler  6  . glimepiride (AMARYL) 1 MG tablet Take 1 tablet by mouth daily.      Marland Kitchen HYDROcodone-acetaminophen (VICODIN) 5-500 MG per tablet Take 2 tablets by mouth every 6 (six) hours as needed.        . metFORMIN (GLUCOPHAGE) 500 MG tablet 1 tab twice a day      . metoprolol succinate (TOPROL-XL) 25 MG 24 hr tablet Take 1/2 tab by mouth two times a day      .  nitroGLYCERIN (NITROSTAT) 0.4 MG SL tablet Place 1 tablet (0.4 mg total) under the tongue every 5 (five) minutes as needed for chest pain.  25 tablet  3  . simvastatin (ZOCOR) 20 MG tablet Take 20 mg by mouth at bedtime.        Marland Kitchen tiotropium (SPIRIVA) 18 MCG inhalation capsule Place 18 mcg into inhaler and inhale daily.      Marland Kitchen torsemide (DEMADEX) 20 MG tablet 2 tablets twice a day  270 tablet  1  . warfarin (COUMADIN) 3 MG tablet Take 1 1/2 tabs by mouth once a day or as directed.       No current facility-administered medications for this encounter.    BP 112/66  Pulse 81  Wt 315 lb 6.4 oz (143.065  kg)  SpO2 85% General: NAD, obese Neck: Thick, JVP 8-9 cm (difficult), no thyromegaly or thyroid nodule.  Lungs: Slight crackles at bases bilaterally.  CV: Nondisplaced PMI.  Heart irregular S1/S2, no S3/S4, 2/6 HSM LLSB. 2+ ankle edema bilaterally.  No carotid bruit.  Unable to feel pedal pulses in setting of significant edema. Abdomen: Soft, nontender, no hepatosplenomegaly, no distention.  Neurologic: Alert and oriented x 3.  Psych: Normal affect. Extremities: No clubbing or cyanosis.   Assessment/Plan: 1. CHF: Chronic diastolic CHF probably with a component of pulmonary arterial HTN from OHS/OSA with right heart failure.  Still NYHA class IIIb with some volume overload despite increasing torsemide at last appointment.  He is not very active.  He is limited by a combination of dyspnea and joint pain.  His BNP has not been elevated in the past but this can be a false negative in the setting of obesity.  - Increase torsemide to 40 mg bid.  - BMET in 10 days.  - Followup in 2 months.    2. OHS/OSA: Continue nocturnal Bipap and oxygen with exertion.  3. Atrial fibrillation: Reasonable rate control on Toprol XL.  Continue coumadin.  4. Dyspnea: Suspect this is a combination of OHS/OSA, paralyzed right hemidiaphragm, and diastolic CHF.   [name] 02/26/2014

## 2014-03-04 ENCOUNTER — Other Ambulatory Visit: Payer: Self-pay

## 2014-03-04 DIAGNOSIS — I5032 Chronic diastolic (congestive) heart failure: Secondary | ICD-10-CM

## 2014-03-04 DIAGNOSIS — I4891 Unspecified atrial fibrillation: Secondary | ICD-10-CM

## 2014-03-04 MED ORDER — TORSEMIDE 20 MG PO TABS: ORAL_TABLET | ORAL | Status: DC

## 2014-03-07 ENCOUNTER — Ambulatory Visit (HOSPITAL_COMMUNITY): Admission: RE | Admit: 2014-03-07 | Payer: Medicare HMO | Source: Ambulatory Visit

## 2014-03-28 ENCOUNTER — Other Ambulatory Visit: Payer: Self-pay | Admitting: Internal Medicine

## 2014-03-31 ENCOUNTER — Other Ambulatory Visit (HOSPITAL_COMMUNITY): Payer: Self-pay | Admitting: Internal Medicine

## 2014-03-31 ENCOUNTER — Ambulatory Visit (HOSPITAL_BASED_OUTPATIENT_CLINIC_OR_DEPARTMENT_OTHER)
Admission: RE | Admit: 2014-03-31 | Discharge: 2014-03-31 | Disposition: A | Discharge status: Home / Self Care | Payer: Medicare HMO | Source: Ambulatory Visit | Attending: Internal Medicine | Admitting: Internal Medicine

## 2014-03-31 ENCOUNTER — Ambulatory Visit (HOSPITAL_COMMUNITY)
Admission: RE | Admit: 2014-03-31 | Discharge: 2014-03-31 | Disposition: A | Discharge status: Home / Self Care | Payer: Medicare HMO | Source: Ambulatory Visit | Attending: Internal Medicine | Admitting: Internal Medicine

## 2014-03-31 DIAGNOSIS — I10 Essential (primary) hypertension: Secondary | ICD-10-CM | POA: Insufficient documentation

## 2014-03-31 DIAGNOSIS — I5040 Unspecified combined systolic (congestive) and diastolic (congestive) heart failure: Secondary | ICD-10-CM

## 2014-03-31 DIAGNOSIS — L97509 Non-pressure chronic ulcer of other part of unspecified foot with unspecified severity: Secondary | ICD-10-CM

## 2014-03-31 DIAGNOSIS — I5022 Chronic systolic (congestive) heart failure: Secondary | ICD-10-CM

## 2014-03-31 DIAGNOSIS — E785 Hyperlipidemia, unspecified: Secondary | ICD-10-CM

## 2014-03-31 LAB — BASIC METABOLIC PANEL
Anion gap: 11 (ref 5–15)
BUN: 25 mg/dL — AB (ref 6–23)
CALCIUM: 8.8 mg/dL (ref 8.4–10.5)
CHLORIDE: 101 meq/L (ref 96–112)
CO2: 26 mEq/L (ref 19–32)
Creatinine, Ser: 1.39 mg/dL — ABNORMAL HIGH (ref 0.50–1.35)
GFR calc Af Amer: 54 mL/min — ABNORMAL LOW (ref >90)
GFR calc non Af Amer: 47 mL/min — ABNORMAL LOW (ref >90)
Glucose, Bld: 172 mg/dL — ABNORMAL HIGH (ref 70–99)
Potassium: 5.4 mEq/L — ABNORMAL HIGH (ref 3.7–5.3)
Sodium: 138 mEq/L (ref 137–147)

## 2014-03-31 NOTE — Progress Notes (Signed)
bmet drawn, pt missed appt on 6/19

## 2014-03-31 NOTE — Progress Notes (Signed)
VASCULAR LAB PRELIMINARY  ARTERIAL  ABI completed:    RIGHT    LEFT    PRESSURE WAVEFORM  PRESSURE WAVEFORM  BRACHIAL 149 triphasic BRACHIAL 147 triphasic  DP   DP    AT 148 triphasic AT 146 triphasic  PT 150 triphasic PT 143 triphasic  PER   PER    GREAT TOE  NA GREAT TOE  NA    RIGHT LEFT  ABI >1.0 0.98     Zannie Runkle, RVT 03/31/2014, 11:49 AM

## 2014-04-01 ENCOUNTER — Telehealth (HOSPITAL_COMMUNITY): Payer: Self-pay

## 2014-04-01 NOTE — Telephone Encounter (Signed)
Lab results reviewed with patient's wife, instructed to come Monday at 1 for repeat labwork.  Aware and agreeable. Renee Pain

## 2014-04-07 ENCOUNTER — Ambulatory Visit (HOSPITAL_COMMUNITY)
Admission: RE | Admit: 2014-04-07 | Discharge: 2014-04-07 | Disposition: A | Discharge status: Home / Self Care | Payer: Medicare HMO | Source: Ambulatory Visit | Attending: Cardiology | Admitting: Cardiology

## 2014-04-07 ENCOUNTER — Telehealth (HOSPITAL_COMMUNITY): Payer: Self-pay

## 2014-04-07 DIAGNOSIS — I5040 Unspecified combined systolic (congestive) and diastolic (congestive) heart failure: Secondary | ICD-10-CM | POA: Diagnosis not present

## 2014-04-07 LAB — BASIC METABOLIC PANEL
Anion gap: 12 (ref 5–15)
BUN: 21 mg/dL (ref 6–23)
CALCIUM: 8.6 mg/dL (ref 8.4–10.5)
CHLORIDE: 103 meq/L (ref 96–112)
CO2: 26 mEq/L (ref 19–32)
Creatinine, Ser: 1.37 mg/dL — ABNORMAL HIGH (ref 0.50–1.35)
GFR calc Af Amer: 55 mL/min — ABNORMAL LOW (ref >90)
GFR calc non Af Amer: 48 mL/min — ABNORMAL LOW (ref >90)
Glucose, Bld: 202 mg/dL — ABNORMAL HIGH (ref 70–99)
Potassium: 5.6 mEq/L — ABNORMAL HIGH (ref 3.7–5.3)
Sodium: 141 mEq/L (ref 137–147)

## 2014-04-07 NOTE — Telephone Encounter (Signed)
Left detailed message with lab results, instructed to limit intake of orange juice, tomoato juice, and mrs. Dash and other salt substitutes to reduce potassium.  Instructed to call back with further questions.

## 2014-04-19 LAB — PULMONARY FUNCTION TEST
DL/VA % PRED: 89 %
DL/VA: 4.3 ml/min/mmHg/L
DLCO UNC: 18.66 ml/min/mmHg
DLCO unc % pred: 49 %
FEF 25-75 Post: 1.13 L/sec
FEF 25-75 Pre: 1.02 L/sec
FEF2575-%Change-Post: 10 %
FEF2575-%PRED-POST: 46 %
FEF2575-%PRED-PRE: 41 %
FEV1-%Change-Post: 4 %
FEV1-%Pred-Post: 51 %
FEV1-%Pred-Pre: 49 %
FEV1-POST: 1.78 L
FEV1-PRE: 1.7 L
FEV1FVC-%CHANGE-POST: 3 %
FEV1FVC-%PRED-PRE: 97 %
FEV6-%CHANGE-POST: 1 %
FEV6-%PRED-PRE: 52 %
FEV6-%Pred-Post: 53 %
FEV6-Post: 2.42 L
FEV6-Pre: 2.38 L
FEV6FVC-%CHANGE-POST: 0 %
FEV6FVC-%PRED-POST: 105 %
FEV6FVC-%Pred-Pre: 105 %
FVC-%CHANGE-POST: 1 %
FVC-%PRED-POST: 50 %
FVC-%Pred-Pre: 50 %
FVC-PRE: 2.41 L
FVC-Post: 2.44 L
PRE FEV1/FVC RATIO: 71 %
Post FEV1/FVC ratio: 73 %
Post FEV6/FVC ratio: 99 %
Pre FEV6/FVC Ratio: 99 %
RV % pred: 64 %
RV: 1.83 L
TLC % pred: 59 %
TLC: 4.67 L

## 2014-04-22 ENCOUNTER — Telehealth: Payer: Self-pay | Admitting: Hematology & Oncology

## 2014-04-22 NOTE — Telephone Encounter (Signed)
Left vm w NEW PATIENT today to remind them of their appointment with Dr. Ennever. Also, advised them to bring all medication bottles and insurance card information. ° °

## 2014-04-23 ENCOUNTER — Other Ambulatory Visit: Payer: Self-pay | Admitting: *Deleted

## 2014-04-23 ENCOUNTER — Ambulatory Visit: Payer: Commercial Managed Care - HMO

## 2014-04-23 ENCOUNTER — Ambulatory Visit (HOSPITAL_BASED_OUTPATIENT_CLINIC_OR_DEPARTMENT_OTHER): Payer: Commercial Managed Care - HMO | Admitting: Family

## 2014-04-23 ENCOUNTER — Ambulatory Visit (HOSPITAL_BASED_OUTPATIENT_CLINIC_OR_DEPARTMENT_OTHER): Payer: Commercial Managed Care - HMO | Admitting: Lab

## 2014-04-23 ENCOUNTER — Encounter: Payer: Self-pay | Admitting: Family

## 2014-04-23 ENCOUNTER — Other Ambulatory Visit (HOSPITAL_COMMUNITY)
Admission: RE | Admit: 2014-04-23 | Discharge: 2014-04-23 | Disposition: A | Discharge status: Home / Self Care | Payer: Medicare HMO | Source: Ambulatory Visit | Attending: Hematology & Oncology | Admitting: Hematology & Oncology

## 2014-04-23 ENCOUNTER — Telehealth: Payer: Self-pay | Admitting: Hematology & Oncology

## 2014-04-23 VITALS — BP 123/77 | HR 76 | Temp 97.9°F | Resp 24 | Ht 74.0 in | Wt 310.0 lb

## 2014-04-23 DIAGNOSIS — D472 Monoclonal gammopathy: Secondary | ICD-10-CM

## 2014-04-23 DIAGNOSIS — D72829 Elevated white blood cell count, unspecified: Secondary | ICD-10-CM

## 2014-04-23 LAB — CMP (CANCER CENTER ONLY)
ALT: 12 U/L (ref 10–47)
AST: 21 U/L (ref 11–38)
Albumin: 3.5 g/dL (ref 3.3–5.5)
Alkaline Phosphatase: 55 U/L (ref 26–84)
BILIRUBIN TOTAL: 1.6 mg/dL (ref 0.20–1.60)
BUN, Bld: 19 mg/dL (ref 7–22)
CO2: 31 meq/L (ref 18–33)
Calcium: 8.2 mg/dL (ref 8.0–10.3)
Chloride: 93 mEq/L — ABNORMAL LOW (ref 98–108)
Creat: 1.4 mg/dl — ABNORMAL HIGH (ref 0.6–1.2)
Glucose, Bld: 205 mg/dL — ABNORMAL HIGH (ref 73–118)
Potassium: 3.7 mEq/L (ref 3.3–4.7)
SODIUM: 139 meq/L (ref 128–145)
TOTAL PROTEIN: 8 g/dL (ref 6.4–8.1)

## 2014-04-23 LAB — MANUAL DIFFERENTIAL (CHCC SATELLITE)
ALC: 6.1 10*3/uL — ABNORMAL HIGH (ref 0.9–3.3)
ANC (CHCC MAN DIFF): 9.4 10*3/uL — AB (ref 1.5–6.5)
EOS: 2 % (ref 0–7)
LYMPH: 36 % (ref 14–48)
MONO: 7 % (ref 0–13)
PLT EST ~~LOC~~: DECREASED
SEG: 55 % (ref 40–75)

## 2014-04-23 LAB — CBC WITH DIFFERENTIAL (CANCER CENTER ONLY)
HCT: 48.9 % (ref 38.7–49.9)
HGB: 16.9 g/dL (ref 13.0–17.1)
MCH: 31.3 pg (ref 28.0–33.4)
MCHC: 34.6 g/dL (ref 32.0–35.9)
MCV: 91 fL (ref 82–98)
Platelets: 105 10*3/uL — ABNORMAL LOW (ref 145–400)
RBC: 5.4 10*6/uL (ref 4.20–5.70)
RDW: 17.1 % — AB (ref 11.1–15.7)
WBC: 17 10*3/uL — AB (ref 4.0–10.0)

## 2014-04-23 NOTE — Progress Notes (Signed)
Hematology/Oncology Consultation   Name: William Braun      MRN: [id number]    Location: Room/bed info not found  Date: 04/23/2014 Time:2:41 PM   REFERRING PHYSICIAN: Nehemiah Settle  REASON FOR CONSULT:   1. Elevated WBC 2. Monoclonal gammopathy of undetermined significance   DIAGNOSIS:  1. Elevated WBC 2. Monoclonal gammopathy of undetermined significance  HISTORY OF PRESENT ILLNESS:  William Braun is a very pleasant male who recently had an elevated white count with routine labs. He is here today with his wife for evaluation and treatment. He states that he has become more and more tired and "feels bad". He is more SOB than usual and requires frequent CPAP breaks at home. He states that part of his diaphragm is paralyzed on the left. He fell on a brick wall when he was a young man and had a bone deep injury to his right Braun. This area became infected and he was treated with antibiotics and it cleared up. This area of his leg is discolored from the injury. He has had several infection in that leg, one in the last month. He was treated with antibiotics at that time and the infection has since cleared up. He does have a cough and has expectorated some grey phlegm a few times. He has no family history of blood disorders or cancer that he is aware of. He was a smoker but quit in 1980. He is a diabetic and states that this is controlled. He has some tingling and pain in his fingers. He is from [place] and was in [employer] force. 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 He denies fever, chills, n/v, headaches, dizziness, chest pain, palpitations, abdominal pain, constipation, diarrhea, blood in urine or stool. He denies swelling, tenderness or numbness in his extremities. His appetite is good.    ROS: All other 10 point review of systems is negative except for those issues mentioned above.  PAST MEDICAL HISTORY:   Past Medical History  Diagnosis Date  . Other and unspecified  hyperlipidemia   . Unspecified essential hypertension   . Type II or unspecified type diabetes mellitus without mention of complication, not stated as uncontrolled   . Osteoarthritis   . Obesity   . History of thrombocytopenia   . Cerebrovascular disease   . Atrial fibrillation    ALLERGIES: Allergies  Allergen Reactions  . Quinine    MEDICATIONS:  Current Outpatient Prescriptions on File Prior to Visit  Medication Sig Dispense Refill  . albuterol (VENTOLIN HFA) 108 (90 BASE) MCG/ACT inhaler Inhale 2 puffs into the lungs every 6 (six) hours as needed.        Marland Kitchen amLODipine (NORVASC) 5 MG tablet Take 5 mg by mouth daily.        . budesonide-formoterol (SYMBICORT) 160-4.5 MCG/ACT inhaler Inhale 2 puffs into the lungs 2 (two) times daily. 2 puffs twice daily  1 Inhaler  6  . glimepiride (AMARYL) 1 MG tablet Take 1 tablet by mouth daily.      Marland Kitchen HYDROcodone-acetaminophen (VICODIN) 5-500 MG per tablet Take 2 tablets by mouth every 6 (six) hours as needed.        . metFORMIN (GLUCOPHAGE) 500 MG tablet 1 tab twice a day      . metoprolol succinate (TOPROL-XL) 25 MG 24 hr tablet Take 1/2 tab by mouth two times a day      . nitroGLYCERIN (NITROSTAT) 0.4 MG SL tablet Place 1 tablet (0.4 mg total)  under the tongue every 5 (five) minutes as needed for chest pain.  25 tablet  3  . simvastatin (ZOCOR) 20 MG tablet Take 20 mg by mouth at bedtime.        Marland Kitchen tiotropium (SPIRIVA) 18 MCG inhalation capsule Place 18 mcg into inhaler and inhale daily.      Marland Kitchen torsemide (DEMADEX) 20 MG tablet Take 2 tablets(40 mg)  twice a day for a total of (80 mg)  270 tablet  1  . warfarin (COUMADIN) 3 MG tablet Take 1 1/2 tabs by mouth once a day or as directed.       No current facility-administered medications on file prior to visit.   PAST SURGICAL HISTORY Past Surgical History  Procedure Laterality Date  . Knee arthroscopy w/ allograft impant    . Vasectomy     FAMILY HISTORY: Family History  Problem Relation  Age of Onset  . Heart disease Father    SOCIAL HISTORY:  reports that he quit smoking about 35 years ago. His smoking use included Cigarettes. He started smoking about 68 years ago. He has a 68 pack-year smoking history. He has never used smokeless tobacco. He reports that he does not drink alcohol. His drug history is not on file.  PERFORMANCE STATUS: The patient's performance status is 1 - Symptomatic but completely ambulatory  PHYSICAL EXAM: Most Recent Vital Signs: Blood pressure 123/77, pulse 76, temperature 97.9 F (36.6 C), temperature source Oral, resp. rate 24, height 6\' 2"  (1.88 m), weight 310 lb (140.615 kg). BP 123/77  Pulse 76  Temp(Src) 97.9 F (36.6 C) (Oral)  Resp 24  Ht 6\' 2"  (1.88 m)  Wt 310 lb (140.615 kg)  BMI 39.78 kg/m2  General Appearance:    Alert, cooperative, no distress, appears stated age  Head:    Normocephalic, without obvious abnormality, atraumatic  Eyes:    PERRL, conjunctiva/corneas clear, EOM's intact, fundi    benign, both eyes             Throat:   Lips, mucosa, and tongue normal; teeth and gums normal  Neck:   Supple, symmetrical, trachea midline, no adenopathy;       thyroid:  No enlargement/tenderness/nodules; no carotid   bruit or JVD  Back:     Symmetric, no curvature, ROM normal, no CVA tenderness  Lungs:     Clear to auscultation bilaterally, respirations unlabored  Chest wall:    No tenderness or deformity  Heart:    Regular rate and rhythm, S1 and S2 normal, no murmur, rub   or gallop  Abdomen:     Soft, non-tender, bowel sounds active all four quadrants,    no masses, no organomegaly        Extremities:   Extremities normal, atraumatic, no cyanosis or edema  Pulses:   2+ and symmetric all extremities  Skin:   Skin color, texture, turgor normal, no rashes or lesions  Lymph nodes:   Cervical, supraclavicular, and axillary nodes normal  Neurologic:   CNII-XII intact. Normal strength, sensation and reflexes      throughout    LABORATORY DATA:  Results for orders placed in visit on 04/23/14 (from the past 48 hour(s))  CBC WITH DIFFERENTIAL ([place])     Status: Abnormal   Collection Time    04/23/14 10:19 AM      Result Value Ref Range   WBC 17.0 (*) 4.0 - 10.0 10e3/uL   RBC 5.40  4.20 - 5.70 10e6/uL   HGB  16.9  13.0 - 17.1 g/dL   HCT 48.9  38.7 - 49.9 %   MCV 91  82 - 98 fL   MCH 31.3  28.0 - 33.4 pg   MCHC 34.6  32.0 - 35.9 g/dL   RDW 17.1 (*) 11.1 - 15.7 %   Platelets 105 (*) 145 - 400 10e3/uL  COMPREHENSIVE METABOLIC PANEL ([facility] REFLEX ONLY)     Status: Abnormal   Collection Time    04/23/14 10:19 AM      Result Value Ref Range   Sodium 139  128 - 145 mEq/L   Potassium 3.7  3.3 - 4.7 mEq/L   Chloride 93 (*) 98 - 108 mEq/L   CO2 31  18 - 33 mEq/L   Glucose, Bld 205 (*) 73 - 118 mg/dL   BUN, Bld 19  7 - 22 mg/dL   Creat 1.4 (*) 0.6 - 1.2 mg/dl   Total Bilirubin 1.60  0.20 - 1.60 mg/dl   Alkaline Phosphatase 55  26 - 84 U/L   AST 21  11 - 38 U/L   ALT(SGPT) 12  10 - 47 U/L   Total Protein 8.0  6.4 - 8.1 g/dL   Albumin 3.5  3.3 - 5.5 g/dL   Calcium 8.2  8.0 - 10.3 mg/dL  MANUAL DIFFERENTIAL ([facility])     Status: Abnormal   Collection Time    04/23/14 10:19 AM      Result Value Ref Range   ANC ([facility] manual diff) 9.4 (*) 1.5 - 6.5 10e3/uL   ALC 6.1 (*) 0.9 - 3.3 10e3/uL   SEG 55  40 - 75 %   LYMPH 36  14 - 48 %   MONO 7  0 - 13 %   Eos 2  0 - 7 %   Ovalocyte Few  Negative   Smudge Cells Few  Negative   WBC Comment Variant Lymphs     PLT EST  Decreased  Adequate     RADIOGRAPHY: No results found.     PATHOLOGY:  None  ASSESSMENT/PLAN: William Braun is a very pleasant male who recently had an elevated white count with routine labs. He is very tired today. His WBC are 17.0 Platelets 105 Hgb 16.9. He is not anemic. His is blood smear is indicative possible of CLL. He states that he has been told in the past by another physician that he has CLL.  He does not appear to  need an intervention at this time but we will wait and see what his labs show.  We will see him back in 2 months for labs and follow-up.  All questions were answered. The patient knows to call the clinic with any problems, questions or concerns. We can certainly see the patient much sooner if necessary.  The patient discussed with and also seen by Dr. Marin Olp and he is in agreement with the aforementioned.   [place] by dr. Marin Olp as follows:   ADDENDUM:  I saw and examined William Braun. He has several health issues. He is on anticoagulation. He's in a motorized wheelchair.  He has some leukocytosis. We looked at his blood smear. He did have some smudge cells. There were an increase in lymphocytes on the smear. I do not see any nucleated red cells. Platelets are adequate. There were no immature myeloid cells. I do not see any hypersegmented polys. The leukocytes appeared relatively normal in morphology. Had a couple large, atypical lymphocytes.  I will go ahead and order a flow cytometry on him. With a monoclonal spike, I need to worry about a lymphoproliferative disorder. CLL certainly is possible.  I am surprised by his lack of anemia.  His LDH is at the upper end of normal. His blood sugars are on the high side. He has some mild renal insufficiency.  I do not see an indication for a bone marrow test right now. I do not see any indication for x-ray studies.  He did not have any lymphadenopathy on exam. There is no obvious liver or spleen tip. He has some scattered ecchymoses on the skin. There is no scleral icterus.  I think we just have to see what the flow cytometry shows. This will dictate what we get him back.  We spent a good hour with he and his wife. He is a former Museum/gallery exhibitions officer.  Laurey Arrow

## 2014-04-23 NOTE — Telephone Encounter (Signed)
William Braun: [id number] Requesting Provider: Dr. Nehemiah Settle Treating Provider: Dr. Burney Gauze Visits: 6 Status: Approved Dates: 04/22/2014 - 10/23/2014  This member is managed by [facility]  COPY SCANNED

## 2014-04-28 LAB — PROTEIN ELECTROPHORESIS, SERUM, WITH REFLEX
ALBUMIN ELP: 45.6 % — AB (ref 55.8–66.1)
ALPHA-1-GLOBULIN: 5.7 % — AB (ref 2.9–4.9)
Alpha-2-Globulin: 10.1 % (ref 7.1–11.8)
Beta 2: 6 % (ref 3.2–6.5)
Beta Globulin: 7 % (ref 4.7–7.2)
Gamma Globulin: 25.6 % — ABNORMAL HIGH (ref 11.1–18.8)
Total Protein, Serum Electrophoresis: 7.8 g/dL (ref 6.0–8.3)

## 2014-04-28 LAB — IGG, IGA, IGM
IgA: 468 mg/dL — ABNORMAL HIGH (ref 68–379)
IgG (Immunoglobin G), Serum: 2160 mg/dL — ABNORMAL HIGH (ref 650–1600)
IgM, Serum: 61 mg/dL (ref 41–251)

## 2014-04-28 LAB — KAPPA/LAMBDA LIGHT CHAINS
KAPPA FREE LGHT CHN: 8.49 mg/dL — AB (ref 0.33–1.94)
KAPPA LAMBDA RATIO: 1.48 (ref 0.26–1.65)
Lambda Free Lght Chn: 5.73 mg/dL — ABNORMAL HIGH (ref 0.57–2.63)

## 2014-04-28 LAB — BETA 2 MICROGLOBULIN, SERUM: BETA 2 MICROGLOBULIN: 5.16 mg/L — AB (ref <=2.51)

## 2014-04-28 LAB — LACTATE DEHYDROGENASE: LDH: 245 U/L (ref 94–250)

## 2014-04-28 LAB — IFE INTERPRETATION

## 2014-04-29 LAB — UIFE/LIGHT CHAINS/TP QN, 24-HR UR
ALPHA 2 UR: DETECTED — AB
Albumin, U: DETECTED
Alpha 1, Urine: DETECTED — AB
BETA UR: DETECTED — AB
FREE KAPPA/LAMBDA RATIO: 4.31 ratio (ref 2.04–10.37)
Free Kappa Lt Chains,Ur: 8.97 mg/dL — ABNORMAL HIGH (ref 0.14–2.42)
Free Lambda Excretion/Day: 38.48 mg/d
Free Lambda Lt Chains,Ur: 2.08 mg/dL — ABNORMAL HIGH (ref 0.02–0.67)
Free Lt Chn Excr Rate: 165.95 mg/d
Gamma Globulin, Urine: DETECTED — AB
TOTAL PROTEIN, URINE-UPE24: 28.5 mg/dL
TOTAL PROTEIN, URINE-UR/DAY: 527 mg/d — AB (ref 10–140)
Time: 24 hours
Volume, Urine: 1850 mL

## 2014-04-29 LAB — FLOW CYTOMETRY - CHCC SATELLITE

## 2014-04-30 ENCOUNTER — Telehealth: Payer: Self-pay | Admitting: *Deleted

## 2014-04-30 NOTE — Telephone Encounter (Addendum)
Message copied by Lenn Sink on Wed Apr 30, 2014  9:03 AM ------      Message from: Volanda Napoleon      Created: Tue Apr 29, 2014 10:22 PM       Call - urine is normal!! pete ------Informed pt that urine is normal

## 2014-05-01 ENCOUNTER — Telehealth (HOSPITAL_COMMUNITY): Payer: Self-pay | Admitting: Vascular Surgery

## 2014-05-01 NOTE — Telephone Encounter (Signed)
Please call pt wife about lab appt.. Insurance is denying 04/07/14 lab appt.William Braun

## 2014-05-12 ENCOUNTER — Ambulatory Visit (HOSPITAL_COMMUNITY)
Admission: RE | Admit: 2014-05-12 | Discharge: 2014-05-12 | Disposition: A | Discharge status: Home / Self Care | Payer: Medicare HMO | Source: Ambulatory Visit | Attending: Cardiology | Admitting: Cardiology

## 2014-05-12 ENCOUNTER — Encounter (HOSPITAL_COMMUNITY): Payer: Self-pay

## 2014-05-12 VITALS — BP 104/78 | HR 78 | Wt 322.0 lb

## 2014-05-12 DIAGNOSIS — Z96659 Presence of unspecified artificial knee joint: Secondary | ICD-10-CM | POA: Insufficient documentation

## 2014-05-12 DIAGNOSIS — Z87891 Personal history of nicotine dependence: Secondary | ICD-10-CM | POA: Diagnosis not present

## 2014-05-12 DIAGNOSIS — E669 Obesity, unspecified: Secondary | ICD-10-CM | POA: Insufficient documentation

## 2014-05-12 DIAGNOSIS — I4891 Unspecified atrial fibrillation: Secondary | ICD-10-CM | POA: Diagnosis not present

## 2014-05-12 DIAGNOSIS — I5032 Chronic diastolic (congestive) heart failure: Secondary | ICD-10-CM | POA: Diagnosis not present

## 2014-05-12 DIAGNOSIS — M1710 Unilateral primary osteoarthritis, unspecified knee: Secondary | ICD-10-CM | POA: Diagnosis not present

## 2014-05-12 DIAGNOSIS — Z7901 Long term (current) use of anticoagulants: Secondary | ICD-10-CM | POA: Insufficient documentation

## 2014-05-12 DIAGNOSIS — Z888 Allergy status to other drugs, medicaments and biological substances status: Secondary | ICD-10-CM | POA: Diagnosis not present

## 2014-05-12 DIAGNOSIS — I482 Chronic atrial fibrillation, unspecified: Secondary | ICD-10-CM

## 2014-05-12 DIAGNOSIS — N189 Chronic kidney disease, unspecified: Secondary | ICD-10-CM | POA: Diagnosis not present

## 2014-05-12 DIAGNOSIS — I129 Hypertensive chronic kidney disease with stage 1 through stage 4 chronic kidney disease, or unspecified chronic kidney disease: Secondary | ICD-10-CM | POA: Diagnosis not present

## 2014-05-12 DIAGNOSIS — G4733 Obstructive sleep apnea (adult) (pediatric): Secondary | ICD-10-CM | POA: Insufficient documentation

## 2014-05-12 DIAGNOSIS — E678 Other specified hyperalimentation: Secondary | ICD-10-CM | POA: Diagnosis not present

## 2014-05-12 DIAGNOSIS — I509 Heart failure, unspecified: Secondary | ICD-10-CM | POA: Diagnosis not present

## 2014-05-12 DIAGNOSIS — R0609 Other forms of dyspnea: Secondary | ICD-10-CM | POA: Diagnosis not present

## 2014-05-12 DIAGNOSIS — E119 Type 2 diabetes mellitus without complications: Secondary | ICD-10-CM | POA: Insufficient documentation

## 2014-05-12 DIAGNOSIS — R0989 Other specified symptoms and signs involving the circulatory and respiratory systems: Secondary | ICD-10-CM | POA: Insufficient documentation

## 2014-05-12 DIAGNOSIS — E785 Hyperlipidemia, unspecified: Secondary | ICD-10-CM | POA: Diagnosis not present

## 2014-05-12 DIAGNOSIS — Z8673 Personal history of transient ischemic attack (TIA), and cerebral infarction without residual deficits: Secondary | ICD-10-CM | POA: Insufficient documentation

## 2014-05-12 MED ORDER — TORSEMIDE 20 MG PO TABS: ORAL_TABLET | ORAL | Status: DC

## 2014-05-12 MED ORDER — POTASSIUM CHLORIDE ER 10 MEQ PO TBCR: 20.0000 meq | EXTENDED_RELEASE_TABLET | Freq: Every day | ORAL | Status: DC

## 2014-05-12 NOTE — Patient Instructions (Signed)
INCREASE Torsemide to 60 mg in the AM and 40 mg in the PM INCREASE Potassium to 20 meQ daily  Labs need in 10 days (BMET,BNP)  Your physician recommends that you schedule a follow-up appointment in: 1 months  Do the following things EVERYDAY: 1) Weigh yourself in the morning before breakfast. Write it down and keep it in a log. 2) Take your medicines as prescribed 3) Eat low salt foods-Limit salt (sodium) to 1500 mg per day.  4) Stay as active as you can everyday 5) Limit all fluids for the day to less than 2 liters 6)

## 2014-05-12 NOTE — Progress Notes (Signed)
Patient ID: William Braun, male   DOB: 07/15/1933, 80 y.o.   MRN: [id number] PCP: Dr. Maxwell Caul  80 yo with h/o HTN, CVA, diabetes, obesity-hypoventilation syndrome, diastolic CHF, and chronic atrial fibrillation presents for cardiology followup.  He has OHS and uses BIPAP at night and oxygen during the day.  He has atrial fibrillation with reasonable rate control and is on coumadin.  He has CHF that I suspect is primarily right-sided from OHS with resulting pulmonary hypertension.  He is having more dyspnea walking around his house.  He uses a scooter outside the house.  No chest pain.  Lower extremity edema is worse.  Weight is up 7 lbs.  His legs continue to feel weak (normal ABIs in 7/15) and left knee pain.  He has been drinking a lot of V-8.  He has been seeing Heme-onc for MGUS, elevated WBCs.   Labs (2/10): creatinine 1.15, BNP 73, TSH normal  Labs (9/12): K 4.1, creatinine 1.4, BNP 90 Labs (11/12): K 4.2, creatinine 1.9 Labs (2/13): K 4.2, creatinine 1.3, proBNP 49, LDL 39, HDL 35 Labs (4/13): K 4, creatinine 1.5, BNP 48 Labs (3/14): K 4 => 3.9, creatinine 1.4 => 1.6 Labs (4/14): K 4, creatinine 1.5 Labs (5/15): K 4.2, creatinine 1.4, BNP 49 Labs (8/15): K 3.7, creatinine 1.4  Allergies (verified):  1) ! Quinine   Past Medical History:  1. Atrial Fibrillation: apparently developed post-op right TKR in 2/10. Pt was started on coumadin. Now chronic.  2. Diabetes Type 2  3. Hyperlipidemia  4. Hypertension  5. Cerebrovascular Disease-CVA-2008  6. Obesity  7. Osteoarthritis left knee, s/p R TKR  8. History of thrombocytopenia  9. Diastolic CHF: Echo (1/57) with EF 55%, moderately dilated RV with moderate RV systolic dysfunction, PA systolic pressure 36 mmHg.  Echo (3/14) with EF 60-65%, moderate LVH, moderate RV dilation with normal systolic function, PA systolic pressure 26-20 mmHg.  10. Obesity hypoventilation syndrome/OSA: BIPAP at night, O2 during the day with exertion. PFTs (1/15) with  FEV1 49%, ratio 97%, TLC 59%, DLCO 49% => mixed obstructive/restrictive.  11. Prolonged hospitalization in fall 2011 with Strep agalactiae bacteremia, MRSA PNA, and septic shock.  12. Lexiscan myoview (11/12) with EF 65%, no ischemia or infarction.  13. 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  14. CKD 15. MGUS: Dr. Marin Olp 16. ABIs (7/15) normal.   Family History:  Father: deceased MVA  Mother: deceased MVA   Social History:  Married  Tobacco Use - Former. 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   Current Outpatient Prescriptions  Medication Sig Dispense Refill  . albuterol (VENTOLIN HFA) 108 (90 BASE) MCG/ACT inhaler Inhale 2 puffs into the lungs every 6 (six) hours as needed.        Marland Kitchen amLODipine (NORVASC) 5 MG tablet Take 5 mg by mouth daily.        . budesonide-formoterol (SYMBICORT) 160-4.5 MCG/ACT inhaler Inhale 2 puffs into the lungs 2 (two) times daily. 2 puffs twice daily  1 Inhaler  6  . glimepiride (AMARYL) 1 MG tablet Take 1 tablet by mouth daily.      Marland Kitchen HYDROcodone-acetaminophen (VICODIN) 5-500 MG per tablet Take 2 tablets by mouth every 6 (six) hours as needed.        . metFORMIN (GLUCOPHAGE) 500 MG tablet 1 tab twice a day      . metoprolol succinate (TOPROL-XL) 25 MG 24 hr  tablet Take 1/2 tab by mouth two times a day      . nitroGLYCERIN (NITROSTAT) 0.4 MG SL tablet Place 1 tablet (0.4 mg total) under the tongue every 5 (five) minutes as needed for chest pain.  25 tablet  3  . simvastatin (ZOCOR) 20 MG tablet Take 20 mg by mouth at bedtime.        Marland Kitchen tiotropium (SPIRIVA) 18 MCG inhalation capsule Place 18 mcg into inhaler and inhale daily.      Marland Kitchen torsemide (DEMADEX) 20 MG tablet Take 3 tabs (60 mg) in the AM and take 2 tabs (40 mg) in the PM  270 tablet  1  . warfarin (COUMADIN) 3 MG tablet Take 1 1/2 tabs by mouth once a day or as directed.      . potassium chloride (K-DUR) 10 MEQ tablet  Take 2 tablets (20 mEq total) by mouth daily.  180 tablet  3   No current facility-administered medications for this encounter.    BP 104/78  Pulse 78  Wt 322 lb (146.058 kg)  SpO2 91% General: NAD, obese Neck: Thick, JVP 8-9 cm (difficult), no thyromegaly or thyroid nodule.  Lungs: Slight crackles at bases bilaterally.  CV: Nondisplaced PMI.  Heart irregular S1/S2, no S3/S4, 2/6 HSM LLSB. 2+ ankle edema bilaterally.  No carotid bruit.  Unable to feel pedal pulses in setting of significant edema. Abdomen: Soft, nontender, no hepatosplenomegaly, no distention.  Neurologic: Alert and oriented x 3.  Psych: Normal affect. Extremities: No clubbing or cyanosis.   Assessment/Plan: 1. CHF: Chronic diastolic CHF probably with a component of pulmonary arterial HTN from OHS/OSA with right heart failure.  Still NYHA class IIIb with volume overload.  He is drinking a lot of V-8.  He is not very active.  He is limited by a combination of dyspnea and joint pain.  His BNP has not been elevated in the past but this can be a false negative in the setting of obesity.  - Increase torsemide to 60 qam, 40 qpm and add KCl 20 mEq daily.  - BMET in 10 days.  - We talked at length about low sodium diet.  He is going to stop drinking V-8.  2. OHS/OSA: Continue nocturnal Bipap and oxygen during the day.   3. Atrial fibrillation: Reasonable rate control on Toprol XL.  Continue coumadin.  4. Dyspnea: Suspect this is a combination of OHS/OSA, paralyzed right hemidiaphragm, and diastolic CHF.   Followup in 1 month.   [name] 05/12/2014

## 2014-06-16 ENCOUNTER — Encounter (HOSPITAL_COMMUNITY): Payer: Medicare HMO

## 2014-06-26 ENCOUNTER — Encounter: Payer: Self-pay | Admitting: Family

## 2014-06-26 ENCOUNTER — Ambulatory Visit (HOSPITAL_BASED_OUTPATIENT_CLINIC_OR_DEPARTMENT_OTHER): Payer: Commercial Managed Care - HMO | Admitting: Family

## 2014-06-26 ENCOUNTER — Other Ambulatory Visit (HOSPITAL_BASED_OUTPATIENT_CLINIC_OR_DEPARTMENT_OTHER): Payer: Commercial Managed Care - HMO | Admitting: Lab

## 2014-06-26 VITALS — BP 128/66 | HR 90 | Temp 97.9°F | Resp 22 | Wt 311.0 lb

## 2014-06-26 DIAGNOSIS — D72829 Elevated white blood cell count, unspecified: Secondary | ICD-10-CM

## 2014-06-26 LAB — CBC WITH DIFFERENTIAL (CANCER CENTER ONLY)
HCT: 47.8 % (ref 38.7–49.9)
HGB: 16.8 g/dL (ref 13.0–17.1)
MCH: 32.2 pg (ref 28.0–33.4)
MCHC: 35.1 g/dL (ref 32.0–35.9)
MCV: 92 fL (ref 82–98)
Platelets: 111 10*3/uL — ABNORMAL LOW (ref 145–400)
RBC: 5.22 10*6/uL (ref 4.20–5.70)
RDW: 15.8 % — AB (ref 11.1–15.7)
WBC: 17.8 10*3/uL — ABNORMAL HIGH (ref 4.0–10.0)

## 2014-06-26 LAB — MANUAL DIFFERENTIAL (CHCC SATELLITE)
ALC: 6.7 10*3/uL — AB (ref 0.9–3.3)
ANC (CHCC HP manual diff): 9.8 10*3/uL — ABNORMAL HIGH (ref 1.5–6.5)
Eos: 2 % (ref 0–7)
LYMPH: 38 % (ref 14–48)
MONO: 5 % (ref 0–13)
PLT EST ~~LOC~~: DECREASED
Platelet Morphology: NORMAL
SEG: 55 % (ref 40–75)

## 2014-06-26 NOTE — Progress Notes (Signed)
[place]  [phone] Fax:(336) [phone]  ID: William Braun OB: [birth year] MR#: [id number] LDJ#:[id number] Patient Care Team: Horton Finer, MD as PCP - General (Internal Medicine)  DIAGNOSIS: Elevated WBC  Monoclonal gammopathy of undetermined significance  INTERVAL HISTORY: William Braun is here today with his wife for a follow-up. He is doing well. William Braun spoke with him previously about his lab result. Today he is feeling good. He states that he has not been feeling tired. His SOB is better. His diaphragm is paralyzed on the left and this gives him some issues. He denies fever, chills, n/v, headaches, dizziness, chest pain, palpitations, abdominal pain, constipation, diarrhea, blood in urine or stool. He denies swelling, tenderness or numbness in his extremities. He has some mild tingling in his fingertips at times. His appetite is good and he is staying hydrated.   CURRENT TREATMENT: Observation  REVIEW OF SYSTEMS: All other 10 point review of systems is negative.  PAST MEDICAL HISTORY: Past Medical History  Diagnosis Date  . Other and unspecified hyperlipidemia   . Unspecified essential hypertension   . Type II or unspecified type diabetes mellitus without mention of complication, not stated as uncontrolled   . Osteoarthritis   . Obesity   . History of thrombocytopenia   . Cerebrovascular disease   . Atrial fibrillation    PAST SURGICAL HISTORY: Past Surgical History  Procedure Laterality Date  . Knee arthroscopy w/ allograft impant    . Vasectomy     FAMILY HISTORY Family History  Problem Relation Age of Onset  . Heart disease Father    GYNECOLOGIC HISTORY:  No LMP for male patient.   SOCIAL HISTORY:  History   Social History  . Marital Status: Married    Spouse Name: N/A    Number of Children: N/A  . Years of Education: N/A   Occupational History  . Retired    Social History Main Topics  . Smoking status: Former Smoker  -- 2.00 packs/day for 34 years    Types: Cigarettes    Start date: 04/23/1946    Quit date: 09/19/1978  . Smokeless tobacco: Never Used     Comment: quit 35ywras ago  . Alcohol Use: No  . Drug Use: Not on file  . Sexual Activity: Not on file   Other Topics Concern  . Not on file   Social History Narrative  . No narrative on file   ADVANCED DIRECTIVES: <no information>  HEALTH MAINTENANCE: History  Substance Use Topics  . Smoking status: Former Smoker -- 2.00 packs/day for 34 years    Types: Cigarettes    Start date: 04/23/1946    Quit date: 09/19/1978  . Smokeless tobacco: Never Used     Comment: quit 35ywras ago  . Alcohol Use: No   Colonoscopy: PAP: Bone density: Lipid panel:  Allergies  Allergen Reactions  . Quinine     Current Outpatient Prescriptions  Medication Sig Dispense Refill  . albuterol (VENTOLIN HFA) 108 (90 BASE) MCG/ACT inhaler Inhale 2 puffs into the lungs every 6 (six) hours as needed.        Marland Kitchen amLODipine (NORVASC) 5 MG tablet Take 5 mg by mouth daily.        . budesonide-formoterol (SYMBICORT) 160-4.5 MCG/ACT inhaler Inhale 2 puffs into the lungs 2 (two) times daily. 2 puffs twice daily  1 Inhaler  6  . glimepiride (AMARYL) 1 MG tablet Take 1 tablet by mouth daily.      Marland Kitchen HYDROcodone-acetaminophen (  VICODIN) 5-500 MG per tablet Take 2 tablets by mouth every 6 (six) hours as needed.        . metFORMIN (GLUCOPHAGE) 500 MG tablet 1 tab twice a day      . metoprolol succinate (TOPROL-XL) 25 MG 24 hr tablet Take 1/2 tab by mouth two times a day      . nitroGLYCERIN (NITROSTAT) 0.4 MG SL tablet Place 1 tablet (0.4 mg total) under the tongue every 5 (five) minutes as needed for chest pain.  25 tablet  3  . potassium chloride (K-DUR) 10 MEQ tablet Take 2 tablets (20 mEq total) by mouth daily.  180 tablet  3  . simvastatin (ZOCOR) 20 MG tablet Take 20 mg by mouth at bedtime.        Marland Kitchen tiotropium (SPIRIVA) 18 MCG inhalation capsule Place 18 mcg into inhaler  and inhale daily.      Marland Kitchen torsemide (DEMADEX) 20 MG tablet Take 3 tabs (60 mg) in the AM and take 2 tabs (40 mg) in the PM  270 tablet  1  . warfarin (COUMADIN) 3 MG tablet Take 1 1/2 tabs by mouth once a day or as directed.       No current facility-administered medications for this visit.   OBJECTIVE: Filed Vitals:   06/26/14 0909  BP: 128/66  Pulse: 90  Temp: 97.9 F (36.6 C)  Resp: 22   Body mass index is 39.91 kg/(m^2). ECOG FS:0 - Asymptomatic Ocular: Sclerae unicteric, pupils equal, round and reactive to light Ear-nose-throat: Oropharynx clear, dentition fair Lymphatic: No cervical or supraclavicular adenopathy Lungs no rales or rhonchi, good excursion bilaterally Heart regular rate and rhythm, no murmur appreciated Abd soft, nontender, positive bowel sounds MSK no focal spinal tenderness, no joint edema Neuro: non-focal, well-oriented, appropriate affect  LAB RESULTS: CMP     Component Value Date/Time   NA 139 04/23/2014 1019   NA 141 04/07/2014 1032   K 3.7 04/23/2014 1019   K 5.6* 04/07/2014 1032   CL 93* 04/23/2014 1019   CL 103 04/07/2014 1032   CO2 31 04/23/2014 1019   CO2 26 04/07/2014 1032   GLUCOSE 205* 04/23/2014 1019   GLUCOSE 202* 04/07/2014 1032   BUN 19 04/23/2014 1019   BUN 21 04/07/2014 1032   CREATININE 1.4* 04/23/2014 1019   CREATININE 1.37* 04/07/2014 1032   CALCIUM 8.2 04/23/2014 1019   CALCIUM 8.6 04/07/2014 1032   PROT 8.0 04/23/2014 1019   PROT 7.7 09/17/2010 0938   ALBUMIN 2.5* 09/17/2010 0938   AST 21 04/23/2014 1019   AST 22 09/17/2010 0938   ALT 12 04/23/2014 1019   ALT 14 09/17/2010 0938   ALKPHOS 55 04/23/2014 1019   ALKPHOS 65 09/17/2010 0938   BILITOT 1.60 04/23/2014 1019   BILITOT 0.6 09/17/2010 0938   GFRNONAA 48* 04/07/2014 1032   GFRAA 55* 04/07/2014 1032   No results found for this basename: SPEP, UPEP,  kappa and lambda light chains   Lab Results  Component Value Date   WBC 17.8* 06/26/2014   NEUTROABS 5.2 09/17/2010   HGB 16.8 06/26/2014   HCT  47.8 06/26/2014   MCV 92 06/26/2014   PLT 111* 06/26/2014   No results found for this basename: LABCA2   No components found with this basename: LABCA125   No results found for this basename: INR,  in the last 168 hours  STUDIES: No results found.  ASSESSMENT/PLAN: William Braun is a very pleasant 80 yo male who recently had  an elevated white count with routine labs. He is feeling much better this visit. His WBC are 17.8 Platelets 111 Hgb 16.8 so no real change from his last labs. He is not anemic.  At this point we will continue to watch him closely. He is asymptomatic at this time. If he begins having issues or his labs start to change we will consider treating him.  We will see him back in 4 months for labs and follow-up.  All questions were answered. He knows to call the clinic with any problems, questions or concerns. We can certainly see him much sooner if necessary.  Eliezer Bottom, NP 06/26/2014 10:00 AM

## 2014-06-27 ENCOUNTER — Encounter (HOSPITAL_COMMUNITY): Payer: Self-pay

## 2014-06-27 ENCOUNTER — Ambulatory Visit (HOSPITAL_COMMUNITY)
Admission: RE | Admit: 2014-06-27 | Discharge: 2014-06-27 | Disposition: A | Discharge status: Home / Self Care | Payer: Medicare HMO | Source: Ambulatory Visit | Attending: Cardiology | Admitting: Cardiology

## 2014-06-27 VITALS — BP 128/80 | HR 71 | Wt 311.0 lb

## 2014-06-27 DIAGNOSIS — Z7901 Long term (current) use of anticoagulants: Secondary | ICD-10-CM | POA: Diagnosis not present

## 2014-06-27 DIAGNOSIS — E119 Type 2 diabetes mellitus without complications: Secondary | ICD-10-CM | POA: Insufficient documentation

## 2014-06-27 DIAGNOSIS — I272 Other secondary pulmonary hypertension: Secondary | ICD-10-CM | POA: Diagnosis not present

## 2014-06-27 DIAGNOSIS — E785 Hyperlipidemia, unspecified: Secondary | ICD-10-CM | POA: Diagnosis not present

## 2014-06-27 DIAGNOSIS — I5032 Chronic diastolic (congestive) heart failure: Secondary | ICD-10-CM | POA: Diagnosis not present

## 2014-06-27 DIAGNOSIS — E662 Morbid (severe) obesity with alveolar hypoventilation: Secondary | ICD-10-CM

## 2014-06-27 DIAGNOSIS — Z8673 Personal history of transient ischemic attack (TIA), and cerebral infarction without residual deficits: Secondary | ICD-10-CM | POA: Diagnosis not present

## 2014-06-27 DIAGNOSIS — Z79899 Other long term (current) drug therapy: Secondary | ICD-10-CM | POA: Insufficient documentation

## 2014-06-27 DIAGNOSIS — I482 Chronic atrial fibrillation, unspecified: Secondary | ICD-10-CM

## 2014-06-27 DIAGNOSIS — R0600 Dyspnea, unspecified: Secondary | ICD-10-CM | POA: Diagnosis not present

## 2014-06-27 DIAGNOSIS — I5030 Unspecified diastolic (congestive) heart failure: Secondary | ICD-10-CM | POA: Diagnosis present

## 2014-06-27 DIAGNOSIS — D472 Monoclonal gammopathy: Secondary | ICD-10-CM | POA: Diagnosis not present

## 2014-06-27 NOTE — Patient Instructions (Signed)
Doing great.  No V8 juice please!

## 2014-06-29 NOTE — Progress Notes (Signed)
Patient ID: William Braun, male   DOB: 08/04/1933, 80 y.o.   MRN: [id number] PCP: Dr. Michail Sermon  80 yo with h/o HTN, CVA, diabetes, obesity-hypoventilation syndrome, diastolic CHF, and chronic atrial fibrillation presents for cardiology followup.  He has OHS and uses BIPAP at night and oxygen during the day.  He has atrial fibrillation with reasonable rate control and is on coumadin.  He has CHF that I suspect is primarily right-sided from OHS with resulting pulmonary hypertension.  He uses a scooter outside the house.  No chest pain. His legs continue to feel weak (normal ABIs in 7/15) and left knee pain.  He has been seeing Heme-onc for MGUS, elevated WBCs.   At last appointment, he appeared more volume overloaded.  I increased his torsemide and had him work on cutting back on sodium.  Weight is down 11 lbs and he is breathing better.  He is walking around his house now rather than using his scooter.    Labs (2/10): creatinine 1.15, BNP 73, TSH normal  Labs (9/12): K 4.1, creatinine 1.4, BNP 90 Labs (11/12): K 4.2, creatinine 1.9 Labs (2/13): K 4.2, creatinine 1.3, proBNP 49, LDL 39, HDL 35 Labs (4/13): K 4, creatinine 1.5, BNP 48 Labs (3/14): K 4 => 3.9, creatinine 1.4 => 1.6 Labs (4/14): K 4, creatinine 1.5 Labs (5/15): K 4.2, creatinine 1.4, BNP 49 Labs (8/15): K 3.7, creatinine 1.4 Labs (10/15): K 3.6, creatinine 1.29  Allergies (verified):  1) ! Quinine   Past Medical History:  1. Atrial Fibrillation: apparently developed post-op right TKR in 2/10. Pt was started on coumadin. Now chronic.  2. Diabetes Type 2  3. Hyperlipidemia  4. Hypertension  5. Cerebrovascular Disease-CVA-2008  6. Obesity  7. Osteoarthritis left knee, s/p R TKR  8. History of thrombocytopenia  9. Diastolic CHF: Echo (5/36) with EF 55%, moderately dilated RV with moderate RV systolic dysfunction, PA systolic pressure 36 mmHg.  Echo (3/14) with EF 60-65%, moderate LVH, moderate RV dilation with normal systolic  function, PA systolic pressure 14-43 mmHg.  10. Obesity hypoventilation syndrome/OSA: BIPAP at night, O2 during the day with exertion. PFTs (1/15) with FEV1 49%, ratio 97%, TLC 59%, DLCO 49% => mixed obstructive/restrictive.  11. Prolonged hospitalization in fall 2011 with Strep agalactiae bacteremia, MRSA PNA, and septic shock.  12. Lexiscan myoview (11/12) with EF 65%, no ischemia or infarction.  13. 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  14. CKD 15. MGUS: Dr. Marin Olp 16. ABIs (7/15) normal.   Family History:  Father: deceased MVA  Mother: deceased MVA   Social History:  Married  Tobacco Use - Former. 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   Current Outpatient Prescriptions  Medication Sig Dispense Refill  . albuterol (VENTOLIN HFA) 108 (90 BASE) MCG/ACT inhaler Inhale 2 puffs into the lungs every 6 (six) hours as needed.        Marland Kitchen amLODipine (NORVASC) 5 MG tablet Take 5 mg by mouth daily.        . budesonide-formoterol (SYMBICORT) 160-4.5 MCG/ACT inhaler Inhale 2 puffs into the lungs 2 (two) times daily. 2 puffs twice daily  1 Inhaler  6  . glimepiride (AMARYL) 1 MG tablet Take 2 tablets by mouth daily.       Marland Kitchen HYDROcodone-acetaminophen (VICODIN) 5-500 MG per tablet Take 2 tablets by mouth every 6 (six) hours as needed.        Marland Kitchen  metFORMIN (GLUCOPHAGE) 500 MG tablet 1 tab twice a day      . metoprolol succinate (TOPROL-XL) 25 MG 24 hr tablet Take 1/2 tab by mouth two times a day      . nitroGLYCERIN (NITROSTAT) 0.4 MG SL tablet Place 1 tablet (0.4 mg total) under the tongue every 5 (five) minutes as needed for chest pain.  25 tablet  3  . potassium chloride (K-DUR) 10 MEQ tablet Take 2 tablets (20 mEq total) by mouth daily.  180 tablet  3  . simvastatin (ZOCOR) 20 MG tablet Take 20 mg by mouth at bedtime.        Marland Kitchen tiotropium (SPIRIVA) 18 MCG inhalation capsule Place 18 mcg into inhaler and inhale  daily.      Marland Kitchen torsemide (DEMADEX) 20 MG tablet Take 3 tabs (60 mg) in the AM and take 2 tabs (40 mg) in the PM  270 tablet  1  . warfarin (COUMADIN) 3 MG tablet Take 1 1/2 tabs by mouth once a day or as directed.       No current facility-administered medications for this encounter.    BP 128/80  Pulse 71  Wt 311 lb (141.069 kg)  SpO2 95% General: NAD, obese Neck: Thick, JVP 7 cm (difficult), no thyromegaly or thyroid nodule.  Lungs: Slight crackles at bases bilaterally.  CV: Nondisplaced PMI.  Heart irregular S1/S2, no S3/S4, 2/6 HSM LLSB. 1+ ankle edema bilaterally.  No carotid bruit.  Unable to feel pedal pulses in setting of edema. Abdomen: Soft, nontender, no hepatosplenomegaly, no distention.  Neurologic: Alert and oriented x 3.  Psych: Normal affect. Extremities: No clubbing or cyanosis.   Assessment/Plan: 1. CHF: Chronic diastolic CHF probably with a component of pulmonary arterial HTN from OHS/OSA with right heart failure.  He has lost 11 lbs on increased torsemide and is watching his sodium intake much better.  JVP is down.  -Continue torsemide to 60 qam, 40 qpm.  - BMET looked good this week.  - I would like to see him doing more walking.  2. OHS/OSA: Continue nocturnal Bipap and oxygen during the day.   3. Atrial fibrillation: Reasonable rate control on Toprol XL.  Continue coumadin.  4. Dyspnea: Suspect this is a combination of OHS/OSA, paralyzed right hemidiaphragm, and diastolic CHF.   Followup in 3 months.   [name] 06/29/2014

## 2014-06-30 LAB — KAPPA/LAMBDA LIGHT CHAINS
Kappa free light chain: 7.16 mg/dL — ABNORMAL HIGH (ref 0.33–1.94)
Kappa:Lambda Ratio: 1.49 (ref 0.26–1.65)
Lambda Free Lght Chn: 4.8 mg/dL — ABNORMAL HIGH (ref 0.57–2.63)

## 2014-06-30 LAB — COMPREHENSIVE METABOLIC PANEL
ALT: 15 U/L (ref 0–53)
AST: 18 U/L (ref 0–37)
Albumin: 3.5 g/dL (ref 3.5–5.2)
Alkaline Phosphatase: 66 U/L (ref 39–117)
BUN: 27 mg/dL — ABNORMAL HIGH (ref 6–23)
CO2: 28 mEq/L (ref 19–32)
Calcium: 8.6 mg/dL (ref 8.4–10.5)
Chloride: 100 mEq/L (ref 96–112)
Creatinine, Ser: 1.29 mg/dL (ref 0.50–1.35)
Glucose, Bld: 128 mg/dL — ABNORMAL HIGH (ref 70–99)
POTASSIUM: 3.6 meq/L (ref 3.5–5.3)
SODIUM: 140 meq/L (ref 135–145)
TOTAL PROTEIN: 7.1 g/dL (ref 6.0–8.3)
Total Bilirubin: 0.8 mg/dL (ref 0.2–1.2)

## 2014-06-30 LAB — IGG, IGA, IGM
IGM, SERUM: 57 mg/dL (ref 41–251)
IgA: 412 mg/dL — ABNORMAL HIGH (ref 68–379)
IgG (Immunoglobin G), Serum: 1770 mg/dL — ABNORMAL HIGH (ref 650–1600)

## 2014-06-30 LAB — LACTATE DEHYDROGENASE: LDH: 199 U/L (ref 94–250)

## 2014-06-30 LAB — BETA 2 MICROGLOBULIN, SERUM: BETA 2 MICROGLOBULIN: 5.2 mg/L — AB (ref <=2.51)

## 2014-07-01 NOTE — Telephone Encounter (Signed)
error 

## 2014-09-17 ENCOUNTER — Other Ambulatory Visit (HOSPITAL_COMMUNITY): Payer: Self-pay | Admitting: Cardiology

## 2014-09-17 DIAGNOSIS — I5032 Chronic diastolic (congestive) heart failure: Secondary | ICD-10-CM

## 2014-09-17 MED ORDER — TORSEMIDE 20 MG PO TABS: ORAL_TABLET | ORAL | Status: DC

## 2014-09-18 ENCOUNTER — Ambulatory Visit: Payer: Commercial Managed Care - HMO | Admitting: Pulmonary Disease

## 2014-10-01 ENCOUNTER — Other Ambulatory Visit (HOSPITAL_COMMUNITY): Payer: Self-pay

## 2014-10-01 DIAGNOSIS — I5032 Chronic diastolic (congestive) heart failure: Secondary | ICD-10-CM

## 2014-10-01 MED ORDER — TORSEMIDE 20 MG PO TABS: ORAL_TABLET | ORAL | Status: DC

## 2014-10-30 ENCOUNTER — Ambulatory Visit: Payer: Medicare HMO | Admitting: Hematology & Oncology

## 2014-10-30 ENCOUNTER — Other Ambulatory Visit: Payer: Commercial Managed Care - HMO | Admitting: Lab

## 2014-11-07 ENCOUNTER — Other Ambulatory Visit: Payer: Self-pay | Admitting: Internal Medicine

## 2014-11-07 ENCOUNTER — Ambulatory Visit
Admission: RE | Admit: 2014-11-07 | Discharge: 2014-11-07 | Disposition: A | Discharge status: Home / Self Care | Payer: Commercial Managed Care - HMO | Source: Ambulatory Visit | Attending: Internal Medicine | Admitting: Internal Medicine

## 2014-11-07 DIAGNOSIS — L97511 Non-pressure chronic ulcer of other part of right foot limited to breakdown of skin: Secondary | ICD-10-CM

## 2014-11-10 ENCOUNTER — Ambulatory Visit (INDEPENDENT_AMBULATORY_CARE_PROVIDER_SITE_OTHER): Payer: Commercial Managed Care - HMO | Admitting: Pulmonary Disease

## 2014-11-10 ENCOUNTER — Encounter (INDEPENDENT_AMBULATORY_CARE_PROVIDER_SITE_OTHER): Payer: Self-pay

## 2014-11-10 ENCOUNTER — Encounter: Payer: Self-pay | Admitting: Pulmonary Disease

## 2014-11-10 VITALS — BP 118/78 | HR 93 | Temp 98.4°F | Ht 74.0 in | Wt 305.4 lb

## 2014-11-10 DIAGNOSIS — J449 Chronic obstructive pulmonary disease, unspecified: Secondary | ICD-10-CM

## 2014-11-10 DIAGNOSIS — J986 Disorders of diaphragm: Secondary | ICD-10-CM

## 2014-11-10 DIAGNOSIS — G4733 Obstructive sleep apnea (adult) (pediatric): Secondary | ICD-10-CM

## 2014-11-10 DIAGNOSIS — E662 Morbid (severe) obesity with alveolar hypoventilation: Secondary | ICD-10-CM

## 2014-11-10 NOTE — Progress Notes (Signed)
Chief Complaint  Patient presents with  . Follow-up    Wears CPAP nightly. Pt states that his machine is running well, needs new humidifier, new mask, head gear and tubing. Pt c/o increased SOB with little exertion at times. Pt struggled to get on scale today. Pt states that he is unable to sleep on back and left side - states he cannot breath. Sees no great improvement with using Symbicort and Spiriva    History of Present Illness: William Braun is a 81 y.o. male former smoker with COPD, OSA/OHS, Rt hemidiaphragm paralysis, and chronic respiratory failure.  He has not been using oxygen at home.  He didn't feel any better when using oxygen.  He didn't understand what supplemental oxygen was doing for him.  He is not very active >> gets tired quickly.  He has been using inhalers, but doesn't feel any benefit.  He is not having cough, wheeze, or sputum.  He has leg swelling, but no worse than usual.  He denies chest pain.  TESTS: PFT 2011 >> FEV1 2.04 (66%), FEV1% 65 SNIFF 11/16/12 >> Rt hemidiaphragm paralysis ONO on BiPAP and 2 liters 11/19/12 >> test time 7 hrs 41 min.  Mean SpO2 93%, low SpO2 64%.  Spent 4 min 12 sec < 88% Echo 11/21/12 >>  Mod LVH, EF 60 to 65%, mod LA dilation, mod RV dilation, PAS 55 mmHg BiPAP 02/21/13 to 08/18/13 >> Used on 179 of 179 nights with average 9 hrs 56 min. Average AHI 1.9 with BiPAP 14/10 cm H2O. PFT 09/30/13 >> FEV1 1.78 (51%), FEV1% 73, TLC 4.67 (59%), DLCO 49%, no BD  PMHx >> HTN, HLD, DM, A fib, CVA 2008  PSHx, Medications, Allergies, Fhx, Shx reviewed.   Physical Exam: Blood pressure 118/78, pulse 93, temperature 98.4 F (36.9 C), temperature source Oral, height 6\' 2"  (1.88 m), weight 305 lb 6.4 oz (138.529 kg), SpO2 87 %. Body mass index is 39.19 kg/(m^2).  General - No distress at rest, winded with minimal activity ENT - No sinus tenderness, no oral exudate, no LAN Cardiac - s1s2 regular, no murmur Chest - decreased breath sounds Rt base, no  wheeze Back - No focal tenderness Abd - Soft, non-tender Ext - 2+ edema Neuro - Normal strength Skin - No rashes Psych - normal mood, and behavior  Assessment/Plan:  Dyspnea >> related to COPD, OSA/OHS, morbid obesity, chronic diastolic heart failure, Rt hemidiaphragm paralysis, and deconditioning.  COPD. He does not feel inhalers have been helping. Plan: - will gradually wean off symbicort as tolerated  - if he is stable, then he can try stopping spiriva - continue prn albuterol  Obstructive sleep apnea. Plan: - continue BiPAP at night >> advised he can use BiPAP during the day prn if he is having trouble with his breathing  Chronic respiratory failure with hypoxia >> related to COPD, OSA/OHS, Rt hemidiaphragm paralysis. Had length discussion with him about the role of supplemental oxygen, and how chronic hypoxia can negatively impact his health. Plan: - continue 2 liters oxygen at rest and with BiPAP at night - he needs to use 4 liters oxygen with exertion   Chesley Mires, MD [facility] Pager:  [phone]

## 2014-11-10 NOTE — Patient Instructions (Signed)
Try gradually decreasing how much symbicort you are using >> if this goes okay after few weeks, then try stopping symbicort and stopping spiriva  Use 2 liters oxygen at rest and with BiPAP machine at night.  Use 4 liters oxygen during the day with activity.  Follow up in 4 months

## 2014-11-11 ENCOUNTER — Telehealth: Payer: Self-pay | Admitting: Pulmonary Disease

## 2014-11-11 NOTE — Telephone Encounter (Signed)
Spoke with pt. States that [facility] will be supplying him everything he needs DME wise. They were actually there while we were on the phone. Nothing further was needed.

## 2014-11-12 ENCOUNTER — Ambulatory Visit (HOSPITAL_COMMUNITY)
Admission: RE | Admit: 2014-11-12 | Discharge: 2014-11-12 | Disposition: A | Discharge status: Home / Self Care | Payer: Commercial Managed Care - HMO | Source: Ambulatory Visit | Attending: Adult Health | Admitting: Adult Health

## 2014-11-12 ENCOUNTER — Encounter: Payer: Self-pay | Admitting: *Deleted

## 2014-11-12 VITALS — BP 96/62 | HR 86 | Wt 305.0 lb

## 2014-11-12 DIAGNOSIS — E662 Morbid (severe) obesity with alveolar hypoventilation: Secondary | ICD-10-CM

## 2014-11-12 DIAGNOSIS — I482 Chronic atrial fibrillation, unspecified: Secondary | ICD-10-CM

## 2014-11-12 DIAGNOSIS — I4891 Unspecified atrial fibrillation: Secondary | ICD-10-CM | POA: Diagnosis not present

## 2014-11-12 DIAGNOSIS — I5032 Chronic diastolic (congestive) heart failure: Secondary | ICD-10-CM | POA: Diagnosis present

## 2014-11-12 DIAGNOSIS — R0600 Dyspnea, unspecified: Secondary | ICD-10-CM | POA: Diagnosis not present

## 2014-11-12 DIAGNOSIS — G4733 Obstructive sleep apnea (adult) (pediatric): Secondary | ICD-10-CM | POA: Diagnosis not present

## 2014-11-12 NOTE — Patient Instructions (Signed)
We will contact you in 4 months to schedule your next appointment.  

## 2014-11-12 NOTE — Progress Notes (Signed)
Patient ID: William Braun, male   DOB: [birth year], 81 y.o.   MRN: [id number] PCP: Dr. Michail Sermon Pulmonary: Dr Halford Chessman  81 yo with h/o HTN, CVA, diabetes, obesity-hypoventilation syndrome, diastolic CHF, and chronic atrial fibrillation presents for cardiology followup.  He has OHS and uses BIPAP at night and oxygen during the day.  He has atrial fibrillation with reasonable rate control and is on coumadin.   His legs continue to feel weak (normal ABIs in 7/15) and left knee pain.  He has been seeing Heme-onc for MGUS, elevated WBCs.   He returns for follow up. Overall feels ok. Limited mobility. Essentially chair bound. Marland Kitchen+Orthopnea. Denies PND. Ongoing dyspnea with exeriton. Drinking> 2 liters fluid daily. He remains on oxygen 2 liters.  continuously. Able to walk to the bathroom. Uses bipap nightly. No BRBPR. Appetite ok.   Labs (2/10): creatinine 1.15, BNP 73, TSH normal  Labs (9/12): K 4.1, creatinine 1.4, BNP 90 Labs (11/12): K 4.2, creatinine 1.9 Labs (2/13): K 4.2, creatinine 1.3, proBNP 49, LDL 39, HDL 35 Labs (4/13): K 4, creatinine 1.5, BNP 48 Labs (3/14): K 4 => 3.9, creatinine 1.4 => 1.6 Labs (4/14): K 4, creatinine 1.5 Labs (5/15): K 4.2, creatinine 1.4, BNP 49 Labs (8/15): K 3.7, creatinine 1.4 Labs (10/15): K 3.6, creatinine 1.29  Allergies (verified):  1) ! Quinine   Past Medical History:  1. Atrial Fibrillation: apparently developed post-op right TKR in 2/10. Pt was started on coumadin. Now chronic.  2. Diabetes Type 2  3. Hyperlipidemia  4. Hypertension  5. Cerebrovascular Disease-CVA-2008  6. Obesity  7. Osteoarthritis left knee, s/p R TKR  8. History of thrombocytopenia  9. Diastolic CHF: Echo (3/15) with EF 55%, moderately dilated RV with moderate RV systolic dysfunction, PA systolic pressure 36 mmHg.  Echo (3/14) with EF 60-65%, moderate LVH, moderate RV dilation with normal systolic function, PA systolic pressure 40-08 mmHg.  10. Obesity hypoventilation syndrome/OSA:  BIPAP at night, O2 during the day with exertion. PFTs (1/15) with FEV1 49%, ratio 97%, TLC 59%, DLCO 49% => mixed obstructive/restrictive.  11. Prolonged hospitalization in fall 2011 with Strep agalactiae bacteremia, MRSA PNA, and septic shock.  12. Lexiscan myoview (11/12) with EF 65%, no ischemia or infarction.  13. 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  14. CKD 15. MGUS: Dr. Marin Olp 16. ABIs (7/15) normal.   Family History:  Father: deceased MVA  Mother: deceased MVA   Social History:  Married  Tobacco Use - Former. 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   Current Outpatient Prescriptions  Medication Sig Dispense Refill  . albuterol (VENTOLIN HFA) 108 (90 BASE) MCG/ACT inhaler Inhale 2 puffs into the lungs every 6 (six) hours as needed.      Marland Kitchen amLODipine (NORVASC) 5 MG tablet Take 5 mg by mouth daily.      . budesonide-formoterol (SYMBICORT) 160-4.5 MCG/ACT inhaler Inhale 2 puffs into the lungs 2 (two) times daily. 2 puffs twice daily 1 Inhaler 6  . glimepiride (AMARYL) 1 MG tablet Take 2 tablets by mouth daily.     Marland Kitchen HYDROcodone-acetaminophen (VICODIN) 5-500 MG per tablet Take 2 tablets by mouth every 6 (six) hours as needed.      . metFORMIN (GLUCOPHAGE) 500 MG tablet 1 tab twice a day    . metoprolol succinate (TOPROL-XL) 25 MG 24 hr tablet Take 1/2 tab by mouth two times a day    .  nitroGLYCERIN (NITROSTAT) 0.4 MG SL tablet Place 1 tablet (0.4 mg total) under the tongue every 5 (five) minutes as needed for chest pain. 25 tablet 3  . potassium chloride (K-DUR) 10 MEQ tablet Take 2 tablets (20 mEq total) by mouth daily. 180 tablet 3  . simvastatin (ZOCOR) 20 MG tablet Take 20 mg by mouth at bedtime.      Marland Kitchen tiotropium (SPIRIVA) 18 MCG inhalation capsule Place 18 mcg into inhaler and inhale daily.    Marland Kitchen torsemide (DEMADEX) 20 MG tablet Take 3 tabs (60 mg) in the AM and take 2 tabs (40 mg) in the PM  450 tablet 3  . warfarin (COUMADIN) 3 MG tablet Take 1 1/2 tabs by mouth once a day or as directed.     No current facility-administered medications for this encounter.    BP 96/62 mmHg  Pulse 86  Wt 305 lb (138.347 kg)  SpO2 93% General: NAD, obese. Arrived in a scooter.  Neck: Thick, JVP 7-8 cm (difficult), no thyromegaly or thyroid nodule.  Lungs: Decreased in the bases on 2 liters [place].   CV: Nondisplaced PMI.  Heart irregular S1/S2, no S3/S4, 2/6 HSM LLSB. 1+ ankle edema bilaterally.  No carotid bruit.  Unable to feel pedal pulses in setting of edema. Abdomen: Soft, nontender, no hepatosplenomegaly, no distention.  Neurologic: Alert and oriented x 3.  Psych: Normal affect. Extremities: No clubbing or cyanosis.   Assessment/Plan: 1. CHF: Chronic diastolic CHF probably with a component of pulmonary arterial HTN from OHS/OSA with right heart failure. Not weighing daily. Drinking > 2 liters per day.  Continue torsemide to 60 qam, 40 qpm. Also instructed to take an extra 20 mg torsemide for  Leg edema or 3 pound weight gain in 24 hours.  2. OHS/OSA: Continue nocturnal Bipap and oxygen during the day.   3. Atrial fibrillation: Reasonable rate control on Toprol XL.  Continue coumadin. INR per PCP.  4. Dyspnea: Suspect this is a combination of OHS/OSA, paralyzed right hemidiaphragm, and diastolic CHF. Followed Dr Halford Chessman every 4 months.   Follow up in 4 months.   CLEGG,AMY NP-C  11/12/2014

## 2014-11-17 ENCOUNTER — Other Ambulatory Visit (HOSPITAL_BASED_OUTPATIENT_CLINIC_OR_DEPARTMENT_OTHER): Payer: Commercial Managed Care - HMO | Admitting: Lab

## 2014-11-17 ENCOUNTER — Ambulatory Visit (HOSPITAL_BASED_OUTPATIENT_CLINIC_OR_DEPARTMENT_OTHER): Payer: Commercial Managed Care - HMO | Admitting: Hematology & Oncology

## 2014-11-17 ENCOUNTER — Encounter: Payer: Self-pay | Admitting: Hematology & Oncology

## 2014-11-17 VITALS — BP 116/57 | HR 98 | Temp 97.7°F | Resp 20 | Ht 74.0 in | Wt 305.0 lb

## 2014-11-17 DIAGNOSIS — C9110 Chronic lymphocytic leukemia of B-cell type not having achieved remission: Secondary | ICD-10-CM

## 2014-11-17 DIAGNOSIS — D72829 Elevated white blood cell count, unspecified: Secondary | ICD-10-CM

## 2014-11-17 HISTORY — DX: Chronic lymphocytic leukemia of B-cell type not having achieved remission: C91.10

## 2014-11-17 LAB — CBC WITH DIFFERENTIAL (CANCER CENTER ONLY)
BASO#: 0.1 10*3/uL (ref 0.0–0.2)
BASO%: 0.5 % (ref 0.0–2.0)
EOS ABS: 1 10*3/uL — AB (ref 0.0–0.5)
EOS%: 4.8 % (ref 0.0–7.0)
HCT: 46.5 % (ref 38.7–49.9)
HGB: 16.1 g/dL (ref 13.0–17.1)
LYMPH#: 10 10*3/uL — ABNORMAL HIGH (ref 0.9–3.3)
LYMPH%: 50.1 % — ABNORMAL HIGH (ref 14.0–48.0)
MCH: 31.6 pg (ref 28.0–33.4)
MCHC: 34.6 g/dL (ref 32.0–35.9)
MCV: 91 fL (ref 82–98)
MONO#: 2 10*3/uL — ABNORMAL HIGH (ref 0.1–0.9)
MONO%: 9.9 % (ref 0.0–13.0)
NEUT#: 6.9 10*3/uL — ABNORMAL HIGH (ref 1.5–6.5)
NEUT%: 34.7 % — ABNORMAL LOW (ref 40.0–80.0)
PLATELETS: 130 10*3/uL — AB (ref 145–400)
RBC: 5.09 10*6/uL (ref 4.20–5.70)
RDW: 16 % — ABNORMAL HIGH (ref 11.1–15.7)
WBC: 19.9 10*3/uL — ABNORMAL HIGH (ref 4.0–10.0)

## 2014-11-17 LAB — TECHNOLOGIST REVIEW CHCC SATELLITE

## 2014-11-17 NOTE — Progress Notes (Signed)
Hematology and Oncology Follow Up Visit  William Braun [id number] [birth year] 81 y.o. 11/17/2014   Principle Diagnosis:   CLL-stage A  Current Therapy:    Observation     Interim History:  Mr.  Braun is back for follow-up. We first saw him in August 2015. At that point time, we did a flow cytometry study on his blood. The flow cytometry did come back positive for CLL.  Heasley had no problems with this today. Pap he does have the monoclonal spike. This has been minimally detectable. Pap he has multiple other health problems. He is on Coumadin. He is a diabetic. He is in a wheelchair.  He's had no issues with recurrent infections. After his knee surgery several years ago, he had MRSA and was hospitalized for about 4 months from what he says.  He's had no rashes. He's had chronic leg swelling. He is on Lasix.  Overall, his performance status is ECOG 2-3.  Medications:  Current outpatient prescriptions:  .  albuterol (VENTOLIN HFA) 108 (90 BASE) MCG/ACT inhaler, Inhale 2 puffs into the lungs every 6 (six) hours as needed.  , Disp: , Rfl:  .  amLODipine (NORVASC) 5 MG tablet, Take 5 mg by mouth daily.  , Disp: , Rfl:  .  budesonide-formoterol (SYMBICORT) 160-4.5 MCG/ACT inhaler, Inhale 2 puffs into the lungs 2 (two) times daily. 2 puffs twice daily, Disp: 1 Inhaler, Rfl: 6 .  glimepiride (AMARYL) 1 MG tablet, Take 2 tablets by mouth daily. , Disp: , Rfl:  .  HYDROcodone-acetaminophen (VICODIN) 5-500 MG per tablet, Take 2 tablets by mouth every 6 (six) hours as needed.  , Disp: , Rfl:  .  metFORMIN (GLUCOPHAGE) 500 MG tablet, 1 tab twice a day, Disp: , Rfl:  .  metoprolol succinate (TOPROL-XL) 25 MG 24 hr tablet, Take 1/2 tab by mouth two times a day, Disp: , Rfl:  .  nitroGLYCERIN (NITROSTAT) 0.4 MG SL tablet, Place 1 tablet (0.4 mg total) under the tongue every 5 (five) minutes as needed for chest pain., Disp: 25 tablet, Rfl: 3 .  potassium chloride (K-DUR) 10 MEQ tablet, Take 2 tablets  (20 mEq total) by mouth daily., Disp: 180 tablet, Rfl: 3 .  simvastatin (ZOCOR) 20 MG tablet, Take 20 mg by mouth at bedtime.  , Disp: , Rfl:  .  tiotropium (SPIRIVA) 18 MCG inhalation capsule, Place 18 mcg into inhaler and inhale daily., Disp: , Rfl:  .  torsemide (DEMADEX) 20 MG tablet, Take 3 tabs (60 mg) in the AM and take 2 tabs (40 mg) in the PM, Disp: 450 tablet, Rfl: 3 .  warfarin (COUMADIN) 3 MG tablet, Take 1 1/2 tabs by mouth once a day or as directed., Disp: , Rfl:   Allergies:  Allergies  Allergen Reactions  . Quinine     Past Medical History, Surgical history, Social history, and Family History were reviewed and updated.  Review of Systems: As above  Physical Exam:  height is 6\' 2"  (1.88 m) and weight is 305 lb (138.347 kg). His oral temperature is 97.7 F (36.5 C). His blood pressure is 116/57 and his pulse is 98. His respiration is 20.   Obese white male in no obvious distress. Head and neck exam shows no ocular or oral lesions. He has no palpable cervical or supraclavicular lymph nodes. Lungs are clear. There may be some slight decrease at the bases. Cardiac exam regular rate and rhythm with an occasional active beat. He has no murmurs,  rubs or bruits. Abdomen is obese with soft. He has good bowel sounds. There is no fluid wave. There is no palpable liver or spleen tip. Extremities shows stasis dermatitis changes in his legs. He has about 2-3+ edema in his lower legs. Right leg is somewhat more swollen than the left leg. Neurological exam is nonfocal. Skin exam shows no rashes, outside of the stasis dermatitis changes in his legs.  Lab Results  Component Value Date   WBC 19.9* 11/17/2014   HGB 16.1 11/17/2014   HCT 46.5 11/17/2014   MCV 91 11/17/2014   PLT 130* 11/17/2014     Chemistry      Component Value Date/Time   NA 140 06/26/2014 0843   NA 139 04/23/2014 1019   K 3.6 06/26/2014 0843   K 3.7 04/23/2014 1019   CL 100 06/26/2014 0843   CL 93* 04/23/2014 1019    CO2 28 06/26/2014 0843   CO2 31 04/23/2014 1019   BUN 27* 06/26/2014 0843   BUN 19 04/23/2014 1019   CREATININE 1.29 06/26/2014 0843   CREATININE 1.4* 04/23/2014 1019      Component Value Date/Time   CALCIUM 8.6 06/26/2014 0843   CALCIUM 8.2 04/23/2014 1019   ALKPHOS 66 06/26/2014 0843   ALKPHOS 55 04/23/2014 1019   AST 18 06/26/2014 0843   AST 21 04/23/2014 1019   ALT 15 06/26/2014 0843   ALT 12 04/23/2014 1019   BILITOT 0.8 06/26/2014 0843   BILITOT 1.60 04/23/2014 1019         Impression and Plan: William Braun is 81 year old male.Marland Kitchen He has stage A CLL. This was documented by flow cytometry. He is asymptomatic.  I looked at his blood smear. I do not see anything that looked suspicious. He had an increase in white blood cells. He had a higher level of lymphocytes. These appeared mature. I do not see any rouleaux formation. There were no schistocytes or spherocytes. I do not see any blasts. Platelets were mildly decreased in number but mature and well granulated.  Again, we can is follow him along. I don't see that we have to embark upon any therapy.  I'll want to get him back in 4 months.  He served in [employer] for 7 years. I thanked him for his service to our country.   Volanda Napoleon, MD 2/29/20163:17 PM

## 2014-11-19 LAB — BETA 2 MICROGLOBULIN, SERUM: BETA 2 MICROGLOBULIN: 6.94 mg/L — AB (ref <=2.51)

## 2014-11-19 LAB — COMPREHENSIVE METABOLIC PANEL
ALK PHOS: 62 U/L (ref 39–117)
ALT: 12 U/L (ref 0–53)
AST: 18 U/L (ref 0–37)
Albumin: 3.4 g/dL — ABNORMAL LOW (ref 3.5–5.2)
BUN: 23 mg/dL (ref 6–23)
CHLORIDE: 97 meq/L (ref 96–112)
CO2: 29 mEq/L (ref 19–32)
Calcium: 9 mg/dL (ref 8.4–10.5)
Creatinine, Ser: 1.36 mg/dL — ABNORMAL HIGH (ref 0.50–1.35)
GLUCOSE: 126 mg/dL — AB (ref 70–99)
POTASSIUM: 3.6 meq/L (ref 3.5–5.3)
Sodium: 140 mEq/L (ref 135–145)
Total Bilirubin: 1 mg/dL (ref 0.2–1.2)
Total Protein: 7.2 g/dL (ref 6.0–8.3)

## 2014-11-19 LAB — KAPPA/LAMBDA LIGHT CHAINS
KAPPA FREE LGHT CHN: 10.7 mg/dL — AB (ref 0.33–1.94)
Kappa:Lambda Ratio: 1.52 (ref 0.26–1.65)
LAMBDA FREE LGHT CHN: 7.06 mg/dL — AB (ref 0.57–2.63)

## 2014-11-19 LAB — LACTATE DEHYDROGENASE: LDH: 245 U/L (ref 94–250)

## 2014-11-19 LAB — IGG, IGA, IGM
IGG (IMMUNOGLOBIN G), SERUM: 2020 mg/dL — AB (ref 650–1600)
IgA: 448 mg/dL — ABNORMAL HIGH (ref 68–379)
IgM, Serum: 55 mg/dL (ref 41–251)

## 2015-02-03 ENCOUNTER — Inpatient Hospital Stay: Payer: Commercial Managed Care - HMO | Admitting: Adult Health

## 2015-02-09 ENCOUNTER — Inpatient Hospital Stay: Payer: Commercial Managed Care - HMO | Admitting: Adult Health

## 2015-03-16 ENCOUNTER — Ambulatory Visit: Payer: Commercial Managed Care - HMO | Admitting: Family

## 2015-03-16 ENCOUNTER — Other Ambulatory Visit: Payer: Commercial Managed Care - HMO

## 2015-03-30 ENCOUNTER — Ambulatory Visit (INDEPENDENT_AMBULATORY_CARE_PROVIDER_SITE_OTHER): Payer: Commercial Managed Care - HMO | Admitting: Pulmonary Disease

## 2015-03-30 ENCOUNTER — Encounter: Payer: Self-pay | Admitting: Pulmonary Disease

## 2015-03-30 VITALS — BP 124/82 | HR 77 | Ht 75.0 in | Wt 307.0 lb

## 2015-03-30 DIAGNOSIS — J986 Disorders of diaphragm: Secondary | ICD-10-CM | POA: Diagnosis not present

## 2015-03-30 DIAGNOSIS — G4733 Obstructive sleep apnea (adult) (pediatric): Secondary | ICD-10-CM

## 2015-03-30 DIAGNOSIS — E662 Morbid (severe) obesity with alveolar hypoventilation: Secondary | ICD-10-CM

## 2015-03-30 DIAGNOSIS — J9611 Chronic respiratory failure with hypoxia: Secondary | ICD-10-CM

## 2015-03-30 DIAGNOSIS — J449 Chronic obstructive pulmonary disease, unspecified: Secondary | ICD-10-CM

## 2015-03-30 MED ORDER — FLUTTER DEVI: Status: DC

## 2015-03-30 NOTE — Progress Notes (Signed)
Chief Complaint  Patient presents with  . Follow-up    Pt reports that breathing has improved some since last OV. Pt c/o increased mucus production-pale yellow. Pt states that his throat seems to stay congested.     History of Present Illness: William Braun is a 81 y.o. male former smoker with COPD, OSA/OHS, Rt hemidiaphragm paralysis, and chronic respiratory failure.  He did not try stopping his inhalers.  He feels these are actually helping.  He feels his breathing is about 40% better compared to last visit.  He still has cough with clear sputum >> it is sometimes difficult to bring his sputum up.    He denies chest pain, wheeze, fever.  Leg swelling is better.    He is using 2 liters oxygen at rest and sleep with BiPAP >> up to 4 liters with exertion.  TESTS: PFT 2011 >> FEV1 2.04 (66%), FEV1% 65 SNIFF 11/16/12 >> Rt hemidiaphragm paralysis ONO on BiPAP and 2 liters 11/19/12 >> test time 7 hrs 41 min.  Mean SpO2 93%, low SpO2 64%.  Spent 4 min 12 sec < 88% Echo 11/21/12 >>  Mod LVH, EF 60 to 65%, mod LA dilation, mod RV dilation, PAS 55 mmHg BiPAP 02/21/13 to 08/18/13 >> Used on 179 of 179 nights with average 9 hrs 56 min. Average AHI 1.9 with BiPAP 14/10 cm H2O. PFT 09/30/13 >> FEV1 1.78 (51%), FEV1% 73, TLC 4.67 (59%), DLCO 49%, no BD  PMHx >> HTN, HLD, DM, A fib, CVA 2008  PSHx, Medications, Allergies, Fhx, Shx reviewed.   Physical Exam: BP 124/82 mmHg  Pulse 77  Ht 6\' 3"  (1.905 m)  Wt 307 lb (139.254 kg)  BMI 38.37 kg/m2  SpO2 90%  General - in wheel chair, wearing oxygen ENT - No sinus tenderness, no oral exudate, no LAN Cardiac - s1s2 regular, no murmur Chest - decreased breath sounds Rt base, no wheeze Back - No focal tenderness Abd - Soft, non-tender Ext - ankle edema Neuro - Normal strength Skin - No rashes Psych - normal mood, and behavior  Discussion:  Dyspnea >> related to COPD, OSA/OHS, morbid obesity, chronic diastolic heart failure, Rt hemidiaphragm  paralysis, and deconditioning.   Assessment/Plan:  COPD. Plan: - continue spiriva, and symbicort - continue prn albuterol - will have him try flutter valve  Obstructive sleep apnea. Plan: - continue BiPAP at night >> advised he can use BiPAP during the day prn if he is having trouble with his breathing  Chronic respiratory failure with hypoxia >> related to COPD, OSA/OHS, Rt hemidiaphragm paralysis. Had length discussion with him about the role of supplemental oxygen, and how chronic hypoxia can negatively impact his health. Plan: - continue 2 liters oxygen at rest and with BiPAP at night - he needs to use 4 liters oxygen with exertion   Chesley Mires, MD [facility] Pager:  [phone]

## 2015-03-30 NOTE — Patient Instructions (Signed)
Try using flutter valve twice per day after using symbicort Follow up in 6 months

## 2015-04-15 ENCOUNTER — Telehealth: Payer: Self-pay | Admitting: Family

## 2015-04-15 NOTE — Telephone Encounter (Signed)
Returned pt's call and scheduled future appt

## 2015-04-22 ENCOUNTER — Ambulatory Visit (HOSPITAL_BASED_OUTPATIENT_CLINIC_OR_DEPARTMENT_OTHER): Payer: Commercial Managed Care - HMO | Admitting: Family

## 2015-04-22 ENCOUNTER — Encounter: Payer: Self-pay | Admitting: Family

## 2015-04-22 ENCOUNTER — Other Ambulatory Visit (HOSPITAL_BASED_OUTPATIENT_CLINIC_OR_DEPARTMENT_OTHER): Payer: Commercial Managed Care - HMO

## 2015-04-22 VITALS — BP 109/66 | HR 89 | Temp 98.1°F | Resp 20 | Ht 71.0 in | Wt 300.0 lb

## 2015-04-22 DIAGNOSIS — C9110 Chronic lymphocytic leukemia of B-cell type not having achieved remission: Secondary | ICD-10-CM | POA: Diagnosis not present

## 2015-04-22 LAB — CBC WITH DIFFERENTIAL (CANCER CENTER ONLY)
BASO#: 0 10*3/uL (ref 0.0–0.2)
BASO%: 0.1 % (ref 0.0–2.0)
EOS%: 3.2 % (ref 0.0–7.0)
Eosinophils Absolute: 0.7 10*3/uL — ABNORMAL HIGH (ref 0.0–0.5)
HCT: 47.9 % (ref 38.7–49.9)
HGB: 16.7 g/dL (ref 13.0–17.1)
LYMPH#: 12.6 10*3/uL — ABNORMAL HIGH (ref 0.9–3.3)
LYMPH%: 54.2 % — AB (ref 14.0–48.0)
MCH: 31.7 pg (ref 28.0–33.4)
MCHC: 34.9 g/dL (ref 32.0–35.9)
MCV: 91 fL (ref 82–98)
MONO#: 1.8 10*3/uL — AB (ref 0.1–0.9)
MONO%: 7.7 % (ref 0.0–13.0)
NEUT#: 8.1 10*3/uL — ABNORMAL HIGH (ref 1.5–6.5)
NEUT%: 34.8 % — ABNORMAL LOW (ref 40.0–80.0)
Platelets: 131 10*3/uL — ABNORMAL LOW (ref 145–400)
RBC: 5.26 10*6/uL (ref 4.20–5.70)
RDW: 16.5 % — ABNORMAL HIGH (ref 11.1–15.7)
WBC: 23.2 10*3/uL — ABNORMAL HIGH (ref 4.0–10.0)

## 2015-04-22 LAB — COMPREHENSIVE METABOLIC PANEL
ALK PHOS: 66 U/L (ref 40–115)
ALT: 12 U/L (ref 9–46)
AST: 17 U/L (ref 10–35)
Albumin: 3.4 g/dL — ABNORMAL LOW (ref 3.6–5.1)
BUN: 29 mg/dL — AB (ref 7–25)
CALCIUM: 9.3 mg/dL (ref 8.6–10.3)
CHLORIDE: 96 mmol/L — AB (ref 98–110)
CO2: 31 mmol/L (ref 20–31)
Creatinine, Ser: 1.34 mg/dL — ABNORMAL HIGH (ref 0.70–1.18)
GLUCOSE: 227 mg/dL — AB (ref 65–99)
POTASSIUM: 4.7 mmol/L (ref 3.5–5.3)
Sodium: 139 mmol/L (ref 135–146)
TOTAL PROTEIN: 7.7 g/dL (ref 6.1–8.1)
Total Bilirubin: 0.8 mg/dL (ref 0.2–1.2)

## 2015-04-22 LAB — CHCC SATELLITE - SMEAR

## 2015-04-22 LAB — TECHNOLOGIST REVIEW CHCC SATELLITE

## 2015-04-22 NOTE — Progress Notes (Signed)
Hematology and Oncology Follow Up Visit  William Braun [id number] Sep 27, 1932 81 y.o. 04/22/2015   Principle Diagnosis:  CLL-stage A  Current Therapy:   Observation    Interim History:  William Braun is here today with his wife for a follow-up. He is doing well and has no complaints at this time. He is planning a trip to [place] to see the life sized William Braun and is very excited.  He has had no worsening SOB. He is on 2L O2 90% of the time.  His WBC count continues to slowly creep up. He is 23.2 today. He is not anemic. His platelet count is 131.  No problem with infections. He has had no fever, chills, n/v, cough, rash, dizziness, chest pain, palpitations, abdominal pain, changes in bowel or bladder habits.  No episodes of bruising or bleeding.  No lymphadenopathy found on exam.  He has some chronic swelling in the lower legs and ankles. 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 This is unchanged. No new aches or pains.   He has a good appetite and is staying hydrated. His weight is stable.   Medications:    Medication List       This list is accurate as of: 04/22/15  1:38 PM.  Always use your most recent med list.               amLODipine 5 MG tablet  Commonly known as:  NORVASC  Take 5 mg by mouth daily.     budesonide-formoterol 160-4.5 MCG/ACT inhaler  Commonly known as:  SYMBICORT  Inhale 2 puffs into the lungs 2 (two) times daily. 2 puffs twice daily     FLUTTER Devi  Use as directed     glimepiride 1 MG tablet  Commonly known as:  AMARYL  Take 2 tablets by mouth daily.     HYDROcodone-acetaminophen 5-500 MG per tablet  Commonly known as:  VICODIN  Take 2 tablets by mouth every 6 (six) hours as needed.     metFORMIN 500 MG tablet  Commonly known as:  GLUCOPHAGE  1 tab twice a day     metoprolol succinate 25 MG 24 hr tablet  Commonly known as:  TOPROL-XL  Take 1/2 tab by mouth two times a day     nitroGLYCERIN 0.4 MG SL tablet  Commonly known as:  NITROSTAT    Place 1 tablet (0.4 mg total) under the tongue every 5 (five) minutes as needed for chest pain.     potassium chloride 10 MEQ tablet  Commonly known as:  K-DUR  Take 2 tablets (20 mEq total) by mouth daily.     simvastatin 20 MG tablet  Commonly known as:  ZOCOR  Take 20 mg by mouth at bedtime.     tiotropium 18 MCG inhalation capsule  Commonly known as:  SPIRIVA  Place 18 mcg into inhaler and inhale daily.     torsemide 20 MG tablet  Commonly known as:  DEMADEX  Take 3 tabs (60 mg) in the AM and take 2 tabs (40 mg) in the PM     VENTOLIN HFA 108 (90 BASE) MCG/ACT inhaler  Generic drug:  albuterol  Inhale 2 puffs into the lungs every 6 (six) hours as needed.     warfarin 3 MG tablet  Commonly known as:  COUMADIN  Take 2 tablets on Saturday, Sunday and Wednesday. Take 1 tablet all other days.        Allergies:  Allergies  Allergen Reactions  .  Quinine     Past Medical History, Surgical history, Social history, and Family History were reviewed and updated.  Review of Systems: All other 10 point review of systems is negative.   Physical Exam:  vitals were not taken for this visit.  Wt Readings from Last 3 Encounters:  03/30/15 307 lb (139.254 kg)  11/17/14 305 lb (138.347 kg)  11/12/14 305 lb (138.347 kg)    Ocular: Sclerae unicteric, pupils equal, round and reactive to light Ear-nose-throat: Oropharynx clear, dentition fair Lymphatic: No cervical or supraclavicular adenopathy Lungs no rales or rhonchi, good excursion bilaterally Heart regular rate and rhythm, no murmur appreciated Abd soft, nontender, positive bowel sounds MSK no focal spinal tenderness, no joint edema Neuro: non-focal, well-oriented, appropriate affect Breasts: Deferred  Lab Results  Component Value Date   WBC 19.9* 11/17/2014   HGB 16.1 11/17/2014   HCT 46.5 11/17/2014   MCV 91 11/17/2014   PLT 130* 11/17/2014   Lab Results  Component Value Date   FERRITIN 157 06/22/2010   IRON  15* 06/22/2010   TIBC 182* 06/22/2010   UIBC 167 06/22/2010   IRONPCTSAT 8* 06/22/2010   Lab Results  Component Value Date   RETICCTPCT 1.9 06/22/2010   RBC 5.09 11/17/2014   Lab Results  Component Value Date   KPAFRELGTCHN 10.70* 11/17/2014   LAMBDASER 7.06* 11/17/2014   KAPLAMBRATIO 1.52 11/17/2014   Lab Results  Component Value Date   IGGSERUM 2020* 11/17/2014   IGA 448* 11/17/2014   IGMSERUM 55 11/17/2014   Lab Results  Component Value Date   TOTALPROTELP 7.8 04/23/2014   ALBUMINELP 45.6* 04/23/2014   A1GS 5.7* 04/23/2014   A2GS 10.1 04/23/2014   BETS 7.0 04/23/2014   BETA2SER 6.0 04/23/2014   GAMS 25.6* 04/23/2014   MSPIKE NOT DET 04/23/2014   SPEI * 04/23/2014     Chemistry      Component Value Date/Time   NA 140 11/17/2014 1418   NA 139 04/23/2014 1019   K 3.6 11/17/2014 1418   K 3.7 04/23/2014 1019   CL 97 11/17/2014 1418   CL 93* 04/23/2014 1019   CO2 29 11/17/2014 1418   CO2 31 04/23/2014 1019   BUN 23 11/17/2014 1418   BUN 19 04/23/2014 1019   CREATININE 1.36* 11/17/2014 1418   CREATININE 1.4* 04/23/2014 1019      Component Value Date/Time   CALCIUM 9.0 11/17/2014 1418   CALCIUM 8.2 04/23/2014 1019   ALKPHOS 62 11/17/2014 1418   ALKPHOS 55 04/23/2014 1019   AST 18 11/17/2014 1418   AST 21 04/23/2014 1019   ALT 12 11/17/2014 1418   ALT 12 04/23/2014 1019   BILITOT 1.0 11/17/2014 1418   BILITOT 1.60 04/23/2014 1019    s d Impression and Plan: William Braun is 81 yo male with stage A CLL documented by flow cytometry. So far this has not been a problem for him. He is asymptomatic at this time.  His WBC count continues to creep up and is 23.2 today. He is not anemic. Platelet count is 131.  Dr. Marin Olp will view his blood smear.  We will continue to follow along with him and plan to see him back in 6 months for labs and follow-up.  Both he and his wife know to call with any questions or concerns. We can certainly see him sooner if need be.    Eliezer Bottom, NP 8/3/20161:38 PM

## 2015-06-15 ENCOUNTER — Ambulatory Visit (INDEPENDENT_AMBULATORY_CARE_PROVIDER_SITE_OTHER): Payer: Commercial Managed Care - HMO

## 2015-06-15 ENCOUNTER — Ambulatory Visit (INDEPENDENT_AMBULATORY_CARE_PROVIDER_SITE_OTHER): Payer: Commercial Managed Care - HMO | Admitting: Podiatry

## 2015-06-15 ENCOUNTER — Encounter: Payer: Self-pay | Admitting: Podiatry

## 2015-06-15 VITALS — BP 128/86 | HR 85 | Resp 18

## 2015-06-15 DIAGNOSIS — L89891 Pressure ulcer of other site, stage 1: Secondary | ICD-10-CM

## 2015-06-15 DIAGNOSIS — E1149 Type 2 diabetes mellitus with other diabetic neurological complication: Secondary | ICD-10-CM

## 2015-06-15 DIAGNOSIS — E1140 Type 2 diabetes mellitus with diabetic neuropathy, unspecified: Secondary | ICD-10-CM

## 2015-06-15 DIAGNOSIS — L97511 Non-pressure chronic ulcer of other part of right foot limited to breakdown of skin: Secondary | ICD-10-CM

## 2015-06-15 DIAGNOSIS — M2040 Other hammer toe(s) (acquired), unspecified foot: Secondary | ICD-10-CM

## 2015-06-15 MED ORDER — SILVER SULFADIAZINE 1 % EX CREA: 1.0000 "application " | TOPICAL_CREAM | Freq: Every day | CUTANEOUS | Status: DC

## 2015-06-15 NOTE — Progress Notes (Signed)
   Subjective:    Patient ID: William Braun, male    DOB: 09-04-1933, 81 y.o.   MRN: [id number]  HPI  81 year old male presents the office they for concerns of an ulcer on the bottom of his big toe which has been ongoing for approximate 2 months. He states that his been healing well but his wife is concerned about the appearance. He previously had an x-ray performed which revealed no osteomyelitis per the patient's wife. He has been applying new skin overlying the wound. He denies any drainage or any pus. Denies any surrounding redness or drainage. No pain to the wound. Denies any swelling. He is diabetic and states his blood sugar is controlled. He denies any claudication symptoms. No other complaints at this time.  Review of Systems  All other systems reviewed and are negative.      Objective:   Physical Exam AAO 3, NAD DP/PT pulses 1/4 bilaterally, CRT less than 3 seconds Protective sensation decreased with Simms Weinstein monofilament, decreased vibratory sensation On the plantar aspect of the right hallux there is a ulceration of the level the IPJ with hyperkeratotic periwound. After debridement the wound measured 1 x 0.4 cm with a granular wound base. There is no probing, undermining, tunneling. There is no surrounding erythema, ascending cellulitis, fluctuance, crepitus, malodor, drainage. The wound appears to be superficial. There is also a superficial pre-ulcerative wound on the dorsal aspect of the second digit on the right foot at the level of the DIPJ. No swelling erythema, drainage or other signs of infection. Hammertoe contractures are present bilaterally. Flatfoot deformity is present. No other open lesions or pre-ulcerative lesions. MMT 5/5, ROM WNL No pain with calf compression, swelling, warmth, erythema.     Assessment & Plan:  81 year old male right plantar hallux ulceration, pre-ulcerative lesion right second toe -X-rays were obtained and reviewed with the patient.    -Treatment options discussed including all alternatives, risks, and complications -Wound was debrided to healthy, bleeding, granular wound base. Recommended Silvadene dressing changes daily. This was prescribed today. Offloading pads were dispensed. Monitor for any clinical signs or symptoms of infection and directed to call the office immediately should any occur or go to the ER. -Can also apply Silvadene over on the second toe wound on the right foot. -Follow-up in 2 weeks or sooner if any problems arise. In the meantime, encouraged to call the office with any questions, concerns, change in symptoms.  *If there is not appropriate healing within the next appointment will order arterial studies.  Celesta Gentile, DPM

## 2015-06-15 NOTE — Patient Instructions (Signed)
Monitor for any clinical signs or symptoms of infection and directed to call the office immediately should any occur or go to the ER.  It was nice meeting you. Have a good week.

## 2015-07-06 ENCOUNTER — Ambulatory Visit (INDEPENDENT_AMBULATORY_CARE_PROVIDER_SITE_OTHER): Payer: Commercial Managed Care - HMO | Admitting: Podiatry

## 2015-07-06 ENCOUNTER — Telehealth: Payer: Self-pay | Admitting: *Deleted

## 2015-07-06 ENCOUNTER — Encounter: Payer: Self-pay | Admitting: Podiatry

## 2015-07-06 VITALS — BP 125/76 | HR 80 | Resp 18

## 2015-07-06 DIAGNOSIS — R0989 Other specified symptoms and signs involving the circulatory and respiratory systems: Secondary | ICD-10-CM

## 2015-07-06 DIAGNOSIS — L89891 Pressure ulcer of other site, stage 1: Secondary | ICD-10-CM | POA: Diagnosis not present

## 2015-07-06 DIAGNOSIS — E1149 Type 2 diabetes mellitus with other diabetic neurological complication: Secondary | ICD-10-CM

## 2015-07-06 DIAGNOSIS — L97511 Non-pressure chronic ulcer of other part of right foot limited to breakdown of skin: Secondary | ICD-10-CM

## 2015-07-06 NOTE — Progress Notes (Signed)
Patient ID: William Braun, male   DOB: 08-Oct-1932, 81 y.o.   MRN: [id number]   Subjective:  81 year old male presents the office a follow-up evaluation of a wound to the bottom of his right hallux. He states he has continued with Silvadene dressing changes daily. He denies any surrounding redness or red streaks. He does get some bloody drainage but denies any pus. He states he may have an odor to the area but he is not sure. He denies any systemic complaints such as fevers, chills, nausea, vomiting. No calf pain, chest pain, shortness of breath. No other complaints at this time.  Objective: AAO 3, NAD, presents in wheelchair DP/PT pulses 1/4, CRT less than 3 seconds Protective sensation decreased with Simms Weinstein monofilament On the plantar aspect of the right hallux with an annular type ulceration measuring approximately the same size as last appointment of 1 x 0.4 cm. The wound does appear to be clean with a granular wound base. There is a hyperkeratotic periwound. There is no swelling erythema, ascending cellulitis, fluctuance, crepitus, malodor, drainage/purulence. There is no probing, undermining, tunneling. There are no other open lesions or pre-ulcerative lesions. There is no areas of tenderness to bilateral lower she was there is no overlying edema, erythema, increase in warmth. There is no pain with calf compression, swelling, warmth, erythema.   Assessment:  81 year old male with ulceration right hallux without signs of infection at this time.   Plan: -Treatment options discussed including all alternatives, risks, and complications -Wound sharply debrided without complications to healthy, granular wound base. Silvadene was applied. I ordered dressings with the patient to include a collagen with silver. This was ordered through [facility].  -Given decreased circulation Will order vascular studies. -Monitor for any clinical signs or symptoms of infection and directed to call the office  immediately should any occur or go to the ER. -Follow-up in 2 weeks or sooner if any problems arise. In the meantime, encouraged to call the office with any questions, concerns, change in symptoms.   Celesta Gentile, DPM

## 2015-07-06 NOTE — Telephone Encounter (Signed)
[facility] order form completed for Collagen with Silver for daily dressing changes to right plantar hallux ulcer 1.4x1.x0.1, low exudate, dx L97.509, supplies to be delivered every 15 days.

## 2015-07-06 NOTE — Telephone Encounter (Signed)
Faxed orders to [facility]. 

## 2015-07-06 NOTE — Patient Instructions (Signed)
Monitor for any clinical signs or symptoms of infection and directed to call the office immediately should any occur or go to the ER.  

## 2015-07-09 ENCOUNTER — Other Ambulatory Visit: Payer: Self-pay | Admitting: Podiatry

## 2015-07-09 DIAGNOSIS — R0989 Other specified symptoms and signs involving the circulatory and respiratory systems: Secondary | ICD-10-CM

## 2015-07-15 ENCOUNTER — Ambulatory Visit (HOSPITAL_COMMUNITY)
Admission: RE | Admit: 2015-07-15 | Discharge: 2015-07-15 | Disposition: A | Discharge status: Home / Self Care | Payer: Commercial Managed Care - HMO | Source: Ambulatory Visit | Attending: Cardiology | Admitting: Cardiology

## 2015-07-15 DIAGNOSIS — I10 Essential (primary) hypertension: Secondary | ICD-10-CM | POA: Diagnosis not present

## 2015-07-15 DIAGNOSIS — E785 Hyperlipidemia, unspecified: Secondary | ICD-10-CM | POA: Diagnosis not present

## 2015-07-15 DIAGNOSIS — R0989 Other specified symptoms and signs involving the circulatory and respiratory systems: Secondary | ICD-10-CM | POA: Diagnosis not present

## 2015-07-15 DIAGNOSIS — E119 Type 2 diabetes mellitus without complications: Secondary | ICD-10-CM | POA: Insufficient documentation

## 2015-07-20 ENCOUNTER — Ambulatory Visit (HOSPITAL_COMMUNITY)
Admission: RE | Admit: 2015-07-20 | Discharge: 2015-07-20 | Disposition: A | Discharge status: Home / Self Care | Payer: Commercial Managed Care - HMO | Source: Ambulatory Visit | Attending: Cardiology | Admitting: Cardiology

## 2015-07-20 VITALS — BP 96/60 | HR 77 | Wt 299.0 lb

## 2015-07-20 DIAGNOSIS — E669 Obesity, unspecified: Secondary | ICD-10-CM | POA: Insufficient documentation

## 2015-07-20 DIAGNOSIS — Z7984 Long term (current) use of oral hypoglycemic drugs: Secondary | ICD-10-CM | POA: Diagnosis not present

## 2015-07-20 DIAGNOSIS — Z8673 Personal history of transient ischemic attack (TIA), and cerebral infarction without residual deficits: Secondary | ICD-10-CM | POA: Diagnosis not present

## 2015-07-20 DIAGNOSIS — Z79899 Other long term (current) drug therapy: Secondary | ICD-10-CM | POA: Insufficient documentation

## 2015-07-20 DIAGNOSIS — I482 Chronic atrial fibrillation, unspecified: Secondary | ICD-10-CM

## 2015-07-20 DIAGNOSIS — D472 Monoclonal gammopathy: Secondary | ICD-10-CM | POA: Diagnosis not present

## 2015-07-20 DIAGNOSIS — E785 Hyperlipidemia, unspecified: Secondary | ICD-10-CM | POA: Diagnosis not present

## 2015-07-20 DIAGNOSIS — N189 Chronic kidney disease, unspecified: Secondary | ICD-10-CM | POA: Diagnosis not present

## 2015-07-20 DIAGNOSIS — Z7901 Long term (current) use of anticoagulants: Secondary | ICD-10-CM | POA: Diagnosis not present

## 2015-07-20 DIAGNOSIS — I4891 Unspecified atrial fibrillation: Secondary | ICD-10-CM | POA: Insufficient documentation

## 2015-07-20 DIAGNOSIS — E662 Morbid (severe) obesity with alveolar hypoventilation: Secondary | ICD-10-CM

## 2015-07-20 DIAGNOSIS — G4733 Obstructive sleep apnea (adult) (pediatric): Secondary | ICD-10-CM | POA: Diagnosis not present

## 2015-07-20 DIAGNOSIS — I130 Hypertensive heart and chronic kidney disease with heart failure and stage 1 through stage 4 chronic kidney disease, or unspecified chronic kidney disease: Secondary | ICD-10-CM | POA: Diagnosis not present

## 2015-07-20 DIAGNOSIS — Z87891 Personal history of nicotine dependence: Secondary | ICD-10-CM | POA: Insufficient documentation

## 2015-07-20 DIAGNOSIS — I5032 Chronic diastolic (congestive) heart failure: Secondary | ICD-10-CM | POA: Diagnosis not present

## 2015-07-20 DIAGNOSIS — E1122 Type 2 diabetes mellitus with diabetic chronic kidney disease: Secondary | ICD-10-CM | POA: Insufficient documentation

## 2015-07-20 MED ORDER — POTASSIUM CHLORIDE ER 10 MEQ PO TBCR: 20.0000 meq | EXTENDED_RELEASE_TABLET | Freq: Two times a day (BID) | ORAL | Status: DC

## 2015-07-20 MED ORDER — TORSEMIDE 20 MG PO TABS: 80.0000 mg | ORAL_TABLET | Freq: Two times a day (BID) | ORAL | Status: DC

## 2015-07-20 NOTE — Progress Notes (Signed)
Advanced Heart Failure Medication Review by a Pharmacist  Does the patient  feel that his/her medications are working for him/her?  yes  Has the patient been experiencing any side effects to the medications prescribed?  no  Does the patient measure his/her own blood pressure or blood glucose at home?  yes   Does the patient have any problems obtaining medications due to transportation or finances?   no  Understanding of regimen: good Understanding of indications: good Potential of compliance: good Patient understands to avoid NSAIDs. Patient understands to avoid decongestants.  Issues to address at subsequent visits: None   Pharmacist comments:  William Braun is [name] presenting with his wife but without a medication list. He reports excellent compliance with his medications and did not have any specific medication-related questions or concerns for me at this time.   [name], PharmD, BCPS, CPP Clinical Pharmacist Pager: [phone] Phone: [phone] 07/20/2015 10:29 AM    Time with patient: 4 minutes Preparation and documentation time: 2 minutes Total time: 6 minutes

## 2015-07-20 NOTE — Progress Notes (Signed)
Patient ID: William Braun, male   DOB: 05-06-1933, 81 y.o.   MRN: [id number] PCP: Dr. Michail Sermon Pulmonary: Dr Halford Chessman  81 yo with h/o HTN, CVA, diabetes, obesity-hypoventilation syndrome, diastolic CHF, and chronic atrial fibrillation presents for cardiology followup.  He has OHS and uses BIPAP at night and oxygen during the day.  He has atrial fibrillation with reasonable rate control and is on coumadin.   His legs continue to feel weak (normal ABIs in 10/16).    He returns for follow up. Limited mobility. Essentially chair bound, walks short distances.  He has been more short of breath than usual for several weeks.  He is short of breath dressing and even short distances walking.  No chest pain. +Orthopnea. No melena/BRBPR.  Labs (2/10): creatinine 1.15, BNP 73, TSH normal  Labs (9/12): K 4.1, creatinine 1.4, BNP 90 Labs (11/12): K 4.2, creatinine 1.9 Labs (2/13): K 4.2, creatinine 1.3, proBNP 49, LDL 39, HDL 35 Labs (4/13): K 4, creatinine 1.5, BNP 48 Labs (3/14): K 4 => 3.9, creatinine 1.4 => 1.6 Labs (4/14): K 4, creatinine 1.5 Labs (5/15): K 4.2, creatinine 1.4, BNP 49 Labs (8/15): K 3.7, creatinine 1.4 Labs (10/15): K 3.6, creatinine 1.29 Labs (8/16): K 4.7, creatinine 1.34, HCT 47.9  Allergies (verified):  1) ! Quinine   Past Medical History:  1. Atrial Fibrillation: apparently developed post-op right TKR in 2/10. Pt was started on coumadin. Now chronic.  2. Diabetes Type 2  3. Hyperlipidemia  4. Hypertension  5. Cerebrovascular Disease-CVA-2008  6. Obesity  7. Osteoarthritis left knee, s/p R TKR  8. History of thrombocytopenia  9. Diastolic CHF: Echo (8/78) with EF 55%, moderately dilated RV with moderate RV systolic dysfunction, PA systolic pressure 36 mmHg.  Echo (3/14) with EF 60-65%, moderate LVH, moderate RV dilation with normal systolic function, PA systolic pressure 67-67 mmHg.  10. Obesity hypoventilation syndrome/OSA: BIPAP at night, O2 during the day with exertion. PFTs  (1/15) with FEV1 49%, ratio 97%, TLC 59%, DLCO 49% => mixed obstructive/restrictive.  11. Prolonged hospitalization in fall 2011 with Strep agalactiae bacteremia, MRSA PNA, and septic shock.  12. Lexiscan myoview (11/12) with EF 65%, no ischemia or infarction.  13. 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  14. CKD 15. MGUS: Dr. Marin Olp 16. ABIs (7/15) normal.  ABIs (10/16) normal.   Family History:  Father: deceased MVA  Mother: deceased MVA   Social History:  Married  Tobacco Use - Former. 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   Current Outpatient Prescriptions  Medication Sig Dispense Refill  . albuterol (VENTOLIN HFA) 108 (90 BASE) MCG/ACT inhaler Inhale 2 puffs into the lungs every 6 (six) hours as needed for wheezing or shortness of breath.     Marland Kitchen amLODipine (NORVASC) 5 MG tablet Take 5 mg by mouth daily.      . budesonide-formoterol (SYMBICORT) 160-4.5 MCG/ACT inhaler Inhale 2 puffs into the lungs 2 (two) times daily. 2 puffs twice daily 1 Inhaler 6  . glimepiride (AMARYL) 2 MG tablet Take 2 mg by mouth daily.    Marland Kitchen HYDROcodone-acetaminophen (VICODIN) 5-500 MG per tablet Take 2 tablets by mouth every 6 (six) hours as needed for pain.     . metFORMIN (GLUCOPHAGE) 500 MG tablet Take 500 mg by mouth 2 (two) times daily with a meal.     . metoprolol succinate (TOPROL-XL) 25 MG 24 hr tablet Take  1/2 tab by mouth two times a day    . potassium chloride (K-DUR) 10 MEQ tablet Take 2 tablets (20 mEq total) by mouth 2 (two) times daily. 120 tablet 3  . Respiratory Therapy Supplies (FLUTTER) DEVI Use as directed 1 each 0  . silver sulfADIAZINE (SILVADENE) 1 % cream Apply 1 application topically daily. 50 g 0  . simvastatin (ZOCOR) 20 MG tablet Take 20 mg by mouth at bedtime.      Marland Kitchen tiotropium (SPIRIVA) 18 MCG inhalation capsule Place 18 mcg into inhaler and inhale daily.    Marland Kitchen torsemide (DEMADEX) 20 MG tablet  Take 4 tablets (80 mg total) by mouth 2 (two) times daily. 240 tablet 3  . warfarin (COUMADIN) 3 MG tablet Take by mouth as directed.     . nitroGLYCERIN (NITROSTAT) 0.4 MG SL tablet Place 1 tablet (0.4 mg total) under the tongue every 5 (five) minutes as needed for chest pain. (Patient not taking: Reported on 07/20/2015) 25 tablet 3   No current facility-administered medications for this encounter.    BP 96/60 mmHg  Pulse 77  Wt 299 lb (135.626 kg)  SpO2 90% General: NAD, obese. Arrived in a scooter.  Neck: Thick, JVP 10-11 cm (difficult), no thyromegaly or thyroid nodule.  Lungs: Crackles at bases bilaterally.   CV: Nondisplaced PMI.  Heart irregular S1/S2, no S3/S4, 2/6 HSM LLSB. 1+ edema to knees bilaterally.  No carotid bruit.  Unable to feel pedal pulses in setting of edema. Abdomen: Soft, nontender, no hepatosplenomegaly, no distention.  Neurologic: Alert and oriented x 3.  Psych: Normal affect. Extremities: No clubbing or cyanosis.   Assessment/Plan: 1. Chronic diastolic CHF: Probably with a component of pulmonary arterial HTN from OHS/OSA with right heart failure.  He is volume overloaded on exam with NYHA class IV symptoms (though his baseline is very limited).   - Increase torsemide to 80 mg bid.   - Increase KCl to 20 bid. - I will get an echo to make sure that EF remains normal.  - BMET in 10 days.  2. OHS/OSA: Continue nocturnal Bipap and oxygen during the day.   3. Atrial fibrillation: Reasonable rate control on Toprol XL.  Continue coumadin. INR per PCP.   Followup in 3 wks.   [name] 07/20/2015

## 2015-07-20 NOTE — Patient Instructions (Signed)
Increase Torsemide to 80 mg (4 tabs) Twice daily   Increase Potassium to 20 meq Twice daily   Labs in about 10 days  Your physician has requested that you have an echocardiogram. Echocardiography is a painless test that uses sound waves to create images of your heart. It provides your doctor with information about the size and shape of your heart and how well your heart's chambers and valves are working. This procedure takes approximately one hour. There are no restrictions for this procedure.  Your physician recommends that you schedule a follow-up appointment in: 3 weeks

## 2015-07-23 ENCOUNTER — Telehealth (HOSPITAL_COMMUNITY): Payer: Self-pay

## 2015-07-23 ENCOUNTER — Telehealth: Payer: Self-pay | Admitting: Pulmonary Disease

## 2015-07-23 MED ORDER — ALBUTEROL SULFATE HFA 108 (90 BASE) MCG/ACT IN AERS: 2.0000 | INHALATION_SPRAY | Freq: Four times a day (QID) | RESPIRATORY_TRACT | Status: AC | PRN

## 2015-07-23 NOTE — Telephone Encounter (Signed)
Called spouse and is aware RX sent in. Nothing further needed 

## 2015-07-24 ENCOUNTER — Ambulatory Visit (INDEPENDENT_AMBULATORY_CARE_PROVIDER_SITE_OTHER): Payer: Commercial Managed Care - HMO | Admitting: Podiatry

## 2015-07-24 ENCOUNTER — Encounter: Payer: Self-pay | Admitting: Podiatry

## 2015-07-24 VITALS — BP 122/62 | HR 78 | Resp 12

## 2015-07-24 DIAGNOSIS — L89891 Pressure ulcer of other site, stage 1: Secondary | ICD-10-CM | POA: Diagnosis not present

## 2015-07-24 DIAGNOSIS — E1149 Type 2 diabetes mellitus with other diabetic neurological complication: Secondary | ICD-10-CM

## 2015-07-24 DIAGNOSIS — L97511 Non-pressure chronic ulcer of other part of right foot limited to breakdown of skin: Secondary | ICD-10-CM

## 2015-07-27 NOTE — Telephone Encounter (Signed)
No Note needed

## 2015-07-29 ENCOUNTER — Ambulatory Visit (HOSPITAL_COMMUNITY)
Admission: RE | Admit: 2015-07-29 | Discharge: 2015-07-29 | Disposition: A | Discharge status: Home / Self Care | Payer: Commercial Managed Care - HMO | Source: Ambulatory Visit | Attending: Internal Medicine | Admitting: Internal Medicine

## 2015-07-29 DIAGNOSIS — I5032 Chronic diastolic (congestive) heart failure: Secondary | ICD-10-CM | POA: Diagnosis present

## 2015-07-29 LAB — BASIC METABOLIC PANEL
ANION GAP: 10 (ref 5–15)
BUN: 26 mg/dL — AB (ref 6–20)
CO2: 31 mmol/L (ref 22–32)
Calcium: 9.2 mg/dL (ref 8.9–10.3)
Chloride: 98 mmol/L — ABNORMAL LOW (ref 101–111)
Creatinine, Ser: 1.23 mg/dL (ref 0.61–1.24)
GFR, EST NON AFRICAN AMERICAN: 54 mL/min — AB (ref >60)
Glucose, Bld: 208 mg/dL — ABNORMAL HIGH (ref 65–99)
POTASSIUM: 4 mmol/L (ref 3.5–5.1)
SODIUM: 139 mmol/L (ref 135–145)

## 2015-07-29 MED ORDER — POTASSIUM CHLORIDE ER 10 MEQ PO TBCR
20.0000 meq | EXTENDED_RELEASE_TABLET | Freq: Two times a day (BID) | ORAL | Status: DC
Start: 2015-07-29 — End: 2015-08-12

## 2015-07-29 NOTE — Addendum Note (Signed)
Encounter addended by: Harvie Junior, CMA on: 07/29/2015  1:57 PM<BR>     Documentation filed: [employer], Orders

## 2015-07-30 ENCOUNTER — Encounter: Payer: Self-pay | Admitting: Podiatry

## 2015-07-30 ENCOUNTER — Ambulatory Visit (HOSPITAL_COMMUNITY): Payer: Commercial Managed Care - HMO | Attending: Cardiology

## 2015-07-30 ENCOUNTER — Other Ambulatory Visit (HOSPITAL_COMMUNITY): Payer: Commercial Managed Care - HMO | Admitting: *Deleted

## 2015-07-30 ENCOUNTER — Other Ambulatory Visit: Payer: Self-pay

## 2015-07-30 DIAGNOSIS — E785 Hyperlipidemia, unspecified: Secondary | ICD-10-CM | POA: Insufficient documentation

## 2015-07-30 DIAGNOSIS — I350 Nonrheumatic aortic (valve) stenosis: Secondary | ICD-10-CM | POA: Diagnosis not present

## 2015-07-30 DIAGNOSIS — I77810 Thoracic aortic ectasia: Secondary | ICD-10-CM | POA: Insufficient documentation

## 2015-07-30 DIAGNOSIS — Z87891 Personal history of nicotine dependence: Secondary | ICD-10-CM | POA: Diagnosis not present

## 2015-07-30 DIAGNOSIS — I10 Essential (primary) hypertension: Secondary | ICD-10-CM | POA: Insufficient documentation

## 2015-07-30 DIAGNOSIS — I059 Rheumatic mitral valve disease, unspecified: Secondary | ICD-10-CM | POA: Diagnosis not present

## 2015-07-30 DIAGNOSIS — I517 Cardiomegaly: Secondary | ICD-10-CM | POA: Insufficient documentation

## 2015-07-30 DIAGNOSIS — I5032 Chronic diastolic (congestive) heart failure: Secondary | ICD-10-CM | POA: Diagnosis not present

## 2015-07-30 DIAGNOSIS — Z6841 Body Mass Index (BMI) 40.0 and over, adult: Secondary | ICD-10-CM | POA: Insufficient documentation

## 2015-07-30 DIAGNOSIS — I5022 Chronic systolic (congestive) heart failure: Secondary | ICD-10-CM

## 2015-07-30 MED ORDER — PERFLUTREN LIPID MICROSPHERE
1.0000 mL | INTRAVENOUS | Status: AC | PRN
  Administered 2015-07-30: 1 mL via INTRAVENOUS

## 2015-07-30 NOTE — Progress Notes (Signed)
Patient ID: William Braun, male   DOB: [birth year], 81 y.o.   MRN: [id number]   Subjective: 81 year old male presents the office a follow-up evaluation of a wound to the bottom of his right hallux. He states he is still having some drainage and bleeding to the wound and he also has noticed a new wound form on the outside portion of his right big toe. He denies any pus coming from the area and denies any redness or increase in warmth or any red streaks. He has continue with Silvadene dressing changes daily. He denies any systemic complaints such as fevers, chills, nausea, vomiting. No calf pain, chest pain, shortness of breath. No other complaints at this time.  Objective: AAO 3, NAD, presents in wheelchair DP/PT pulses 1/4, CRT less than 3 seconds Protective sensation decreased with Simms Weinstein monofilament On the plantar aspect of the right hallux with an annular type ulceration measuring 0.5 x 0.1 and appears to be improving. The wound is superficial   andhas a granular wound base. There is a new wound on the lateral aspect of the hallux measuring 0.4 x 0.4 cm. Neither of these wounds probe to bone and there is no undermining or tunneling. There is no ascending erythema, ascending cellulitis, fluctuance, crepitus, malodor, drainage/purulence. There are no other open lesions or pre-ulcerative lesions. There is no areas of tenderness to bilateral lower she was there is no overlying edema, erythema, increase in warmth. There is no pain with calf compression, swelling, warmth, erythema.   Assessment:  81 year old male with ulceration right halluxs without signs of infection at this time.   Plan: -Treatment options discussed including all alternatives, risks, and complications -Wound sharply debrided without complications to healthy, granular wound base. Silvadene was applied.  Continue daily dressing changes. -Vascular studies reviewed.  -Monitor for any clinical signs or symptoms of infection and  directed to call the office immediately should any occur or go to the ER. -Follow-up in 3 weeks or sooner if any problems arise. In the meantime, encouraged to call the office with any questions, concerns, change in symptoms.   ABI: Technically challenging study secondary to respirations, and inability to recline. No evidence of segmental lower extremity arterial disease at rest, bilaterally. Normal ABI's, bilaterally. Bilateral great toe pressures are normal.  Celesta Gentile, DPM

## 2015-08-10 ENCOUNTER — Telehealth (HOSPITAL_COMMUNITY): Payer: Self-pay

## 2015-08-10 ENCOUNTER — Ambulatory Visit (HOSPITAL_COMMUNITY)
Admission: RE | Admit: 2015-08-10 | Discharge: 2015-08-10 | Disposition: A | Discharge status: Home / Self Care | Payer: Commercial Managed Care - HMO | Source: Ambulatory Visit | Attending: Internal Medicine | Admitting: Internal Medicine

## 2015-08-10 VITALS — BP 92/64 | HR 73

## 2015-08-10 DIAGNOSIS — I679 Cerebrovascular disease, unspecified: Secondary | ICD-10-CM | POA: Diagnosis not present

## 2015-08-10 DIAGNOSIS — Z79899 Other long term (current) drug therapy: Secondary | ICD-10-CM | POA: Diagnosis not present

## 2015-08-10 DIAGNOSIS — I129 Hypertensive chronic kidney disease with stage 1 through stage 4 chronic kidney disease, or unspecified chronic kidney disease: Secondary | ICD-10-CM | POA: Diagnosis not present

## 2015-08-10 DIAGNOSIS — I10 Essential (primary) hypertension: Secondary | ICD-10-CM | POA: Insufficient documentation

## 2015-08-10 DIAGNOSIS — E662 Morbid (severe) obesity with alveolar hypoventilation: Secondary | ICD-10-CM | POA: Diagnosis not present

## 2015-08-10 DIAGNOSIS — Z7984 Long term (current) use of oral hypoglycemic drugs: Secondary | ICD-10-CM | POA: Diagnosis not present

## 2015-08-10 DIAGNOSIS — E1122 Type 2 diabetes mellitus with diabetic chronic kidney disease: Secondary | ICD-10-CM | POA: Diagnosis not present

## 2015-08-10 DIAGNOSIS — E785 Hyperlipidemia, unspecified: Secondary | ICD-10-CM | POA: Insufficient documentation

## 2015-08-10 DIAGNOSIS — I4891 Unspecified atrial fibrillation: Secondary | ICD-10-CM | POA: Diagnosis not present

## 2015-08-10 DIAGNOSIS — Z87891 Personal history of nicotine dependence: Secondary | ICD-10-CM | POA: Diagnosis not present

## 2015-08-10 DIAGNOSIS — Z8673 Personal history of transient ischemic attack (TIA), and cerebral infarction without residual deficits: Secondary | ICD-10-CM | POA: Diagnosis not present

## 2015-08-10 DIAGNOSIS — I5022 Chronic systolic (congestive) heart failure: Secondary | ICD-10-CM | POA: Insufficient documentation

## 2015-08-10 DIAGNOSIS — I272 Other secondary pulmonary hypertension: Secondary | ICD-10-CM | POA: Diagnosis not present

## 2015-08-10 DIAGNOSIS — I5032 Chronic diastolic (congestive) heart failure: Secondary | ICD-10-CM | POA: Diagnosis present

## 2015-08-10 DIAGNOSIS — N189 Chronic kidney disease, unspecified: Secondary | ICD-10-CM | POA: Insufficient documentation

## 2015-08-10 DIAGNOSIS — D472 Monoclonal gammopathy: Secondary | ICD-10-CM | POA: Insufficient documentation

## 2015-08-10 DIAGNOSIS — Z7901 Long term (current) use of anticoagulants: Secondary | ICD-10-CM | POA: Insufficient documentation

## 2015-08-10 LAB — BASIC METABOLIC PANEL
ANION GAP: 10 (ref 5–15)
BUN: 28 mg/dL — AB (ref 6–20)
CO2: 27 mmol/L (ref 22–32)
Calcium: 8.6 mg/dL — ABNORMAL LOW (ref 8.9–10.3)
Chloride: 98 mmol/L — ABNORMAL LOW (ref 101–111)
Creatinine, Ser: 1.39 mg/dL — ABNORMAL HIGH (ref 0.61–1.24)
GFR calc Af Amer: 54 mL/min — ABNORMAL LOW (ref >60)
GFR, EST NON AFRICAN AMERICAN: 46 mL/min — AB (ref >60)
Glucose, Bld: 352 mg/dL — ABNORMAL HIGH (ref 65–99)
POTASSIUM: 4.5 mmol/L (ref 3.5–5.1)
SODIUM: 135 mmol/L (ref 135–145)

## 2015-08-10 LAB — PROTIME-INR
INR: 1.8 — ABNORMAL HIGH (ref 0.00–1.49)
Prothrombin Time: 20.8 seconds — ABNORMAL HIGH (ref 11.6–15.2)

## 2015-08-10 LAB — BRAIN NATRIURETIC PEPTIDE: B NATRIURETIC PEPTIDE 5: 78.2 pg/mL (ref 0.0–100.0)

## 2015-08-10 MED ORDER — METOLAZONE 2.5 MG PO TABS: ORAL_TABLET | ORAL | Status: DC

## 2015-08-10 MED ORDER — AMLODIPINE BESYLATE 5 MG PO TABS: 2.5000 mg | ORAL_TABLET | Freq: Every day | ORAL | Status: DC

## 2015-08-10 NOTE — Progress Notes (Signed)
Patient ID: William Braun, male   DOB: 1933/01/04, 81 y.o.   MRN: [id number] PCP: Dr. Michail Sermon Pulmonary: Dr Halford Chessman  81 yo with h/o HTN, CVA, diabetes, obesity-hypoventilation syndrome, diastolic CHF, and chronic atrial fibrillation presents for cardiology followup.  He has OHS and uses BIPAP at night and oxygen during the day.  He has atrial fibrillation with reasonable rate control and is on coumadin.   His legs continue to feel weak (normal ABIs in 10/16).    He returns for follow up. Limited mobility. Essentially chair bound, walks short distances.  He has been more short of breath than usual since prior to last appointment.  He is short of breath dressing and even short distances walking.  No chest pain. +Orthopnea. No melena/BRBPR. Still with significant LE edema.   Labs (2/10): creatinine 1.15, BNP 73, TSH normal  Labs (9/12): K 4.1, creatinine 1.4, BNP 90 Labs (11/12): K 4.2, creatinine 1.9 Labs (2/13): K 4.2, creatinine 1.3, proBNP 49, LDL 39, HDL 35 Labs (4/13): K 4, creatinine 1.5, BNP 48 Labs (3/14): K 4 => 3.9, creatinine 1.4 => 1.6 Labs (4/14): K 4, creatinine 1.5 Labs (5/15): K 4.2, creatinine 1.4, BNP 49 Labs (8/15): K 3.7, creatinine 1.4 Labs (10/15): K 3.6, creatinine 1.29 Labs (8/16): K 4.7, creatinine 1.34, HCT 47.9 Labs (11/160: K 4, creatinine 1.23  Allergies (verified):  1) ! Quinine   Past Medical History:  1. Atrial Fibrillation: apparently developed post-op right TKR in 2/10. Pt was started on coumadin. Now chronic.  2. Diabetes Type 2  3. Hyperlipidemia  4. Hypertension  5. Cerebrovascular Disease-CVA-2008  6. Obesity  7. Osteoarthritis left knee, s/p R TKR  8. History of thrombocytopenia  9. Diastolic CHF: Echo (123XX123) with EF 55%, moderately dilated RV with moderate RV systolic dysfunction, PA systolic pressure 36 mmHg.  Echo (3/14) with EF 60-65%, moderate LVH, moderate RV dilation with normal systolic function, PA systolic pressure [id number] mmHg.  Echo (11/16)  with EF 60-65%, mild LVH, mild aortic stenosis, mildly dilated aortic root 4.3 cm, PASP 71 mmHg.  10. Obesity hypoventilation syndrome/OSA: BIPAP at night, O2 during the day with exertion. PFTs (1/15) with FEV1 49%, ratio 97%, TLC 59%, DLCO 49% => mixed obstructive/restrictive.  11. Prolonged hospitalization in fall 2011 with Strep agalactiae bacteremia, MRSA PNA, and septic shock.  12. Lexiscan myoview (11/12) with EF 65%, no ischemia or infarction.  13. 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  14. CKD 15. MGUS: Dr. Marin Olp 16. ABIs (7/15) normal.  ABIs (10/16) normal.   Family History:  Father: deceased MVA  Mother: deceased MVA   Social History:  Married  Tobacco Use - Former. 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   Current Outpatient Prescriptions  Medication Sig Dispense Refill  . albuterol (VENTOLIN HFA) 108 (90 BASE) MCG/ACT inhaler Inhale 2 puffs into the lungs every 6 (six) hours as needed for wheezing or shortness of breath. 3 Inhaler 1  . amLODipine (NORVASC) 5 MG tablet Take 0.5 tablets (2.5 mg total) by mouth daily. 15 tablet 3  . budesonide-formoterol (SYMBICORT) 160-4.5 MCG/ACT inhaler Inhale 2 puffs into the lungs 2 (two) times daily. 2 puffs twice daily 1 Inhaler 6  . glimepiride (AMARYL) 2 MG tablet Take 2 mg by mouth daily.    Marland Kitchen HYDROcodone-acetaminophen (VICODIN) 5-500 MG per tablet Take 2 tablets by mouth every 6 (six) hours as needed for pain.     Marland Kitchen  metFORMIN (GLUCOPHAGE) 500 MG tablet Take 500 mg by mouth 2 (two) times daily with a meal.     . metolazone (ZAROXOLYN) 2.5 MG tablet Metolazone 2.5 mg twice a week as directed 60 tablet 3  . metoprolol succinate (TOPROL-XL) 25 MG 24 hr tablet Take 1/2 tab by mouth two times a day    . nitroGLYCERIN (NITROSTAT) 0.4 MG SL tablet Place 1 tablet (0.4 mg total) under the tongue every 5 (five) minutes as needed for chest pain. 25 tablet 3   . potassium chloride (K-DUR) 10 MEQ tablet Take 2 tablets (20 mEq total) by mouth 2 (two) times daily. 120 tablet 3  . Respiratory Therapy Supplies (FLUTTER) DEVI Use as directed 1 each 0  . silver sulfADIAZINE (SILVADENE) 1 % cream Apply 1 application topically daily. 50 g 0  . simvastatin (ZOCOR) 20 MG tablet Take 20 mg by mouth at bedtime.      Marland Kitchen tiotropium (SPIRIVA) 18 MCG inhalation capsule Place 18 mcg into inhaler and inhale daily.    Marland Kitchen torsemide (DEMADEX) 20 MG tablet Take 4 tablets (80 mg total) by mouth 2 (two) times daily. 240 tablet 3  . warfarin (COUMADIN) 3 MG tablet Take by mouth as directed.      No current facility-administered medications for this encounter.    BP 92/64 mmHg  Pulse 73  SpO2 90% General: NAD, obese. Arrived in a scooter.  Neck: Thick, JVP 10-11 cm (difficult), no thyromegaly or thyroid nodule.  Lungs: Crackles at bases bilaterally.   CV: Nondisplaced PMI.  Heart irregular S1/S2, no S3/S4, 2/6 HSM LLSB. 2+ edema to knees bilaterally.  No carotid bruit.  Unable to feel pedal pulses in setting of edema. Abdomen: Soft, nontender, no hepatosplenomegaly, no distention.  Neurologic: Alert and oriented x 3.  Psych: Normal affect. Extremities: No clubbing or cyanosis.   Assessment/Plan: 1. Chronic diastolic CHF: Probably with a component of pulmonary arterial HTN from OHS/OSA with right heart failure.  He is volume overloaded on exam with NYHA class IV symptoms (though his baseline is very limited).   - Continue torsemide to 80 mg bid.   - Add metolazone 2.5 mg twice a week on Tuesday and Friday, take extra KCl 40 on metolazone days.  - BMET today and in 10 days.  - I will have him wear graded compression stockings.  2. OHS/OSA: Continue nocturnal Bipap and oxygen during the day.   3. Atrial fibrillation: Reasonable rate control on Toprol XL.  Continue coumadin. INR per PCP.  4. HTN: BP actually on the lower side now.  Decrease amlodipine to 2.5 mg daily.    [name] 08/10/2015

## 2015-08-10 NOTE — Addendum Note (Signed)
Encounter addended by: Larey Dresser, MD on: 08/10/2015 10:24 PM<BR>     Documentation filed: Problem List, Visit Diagnoses

## 2015-08-10 NOTE — Addendum Note (Signed)
Encounter addended by: Larey Dresser, MD on: 08/10/2015 10:23 PM<BR>     Documentation filed: Follow-up Section, LOS Section, Clinical Notes

## 2015-08-10 NOTE — Telephone Encounter (Signed)
Called in Metolazone 2.5 mg to take twice a week after patient called and said his RX was not renewed at the pharmacy

## 2015-08-10 NOTE — Patient Instructions (Addendum)
DECREASE Amlodipine to 2.5mg  daily.  TAKE Metolazone 2.5mg  EVERY Tuesday and Friday... Take extra 40mg  of Potassium with metolazone.  Routine lab work today. (bmet bnp) Will notify you of abnormal results  PLEASE [place].  REPEAT Labs:2 weeks.  FOLLOW UP: 6 weeks (after jan.1st)

## 2015-08-11 ENCOUNTER — Telehealth (HOSPITAL_COMMUNITY): Payer: Self-pay

## 2015-08-11 ENCOUNTER — Encounter (HOSPITAL_COMMUNITY): Payer: Commercial Managed Care - HMO | Admitting: Internal Medicine

## 2015-08-11 NOTE — Telephone Encounter (Signed)
Returned patient call.  Patient initially called and said that [employer] [facility] did not receive script but now has it

## 2015-08-12 ENCOUNTER — Other Ambulatory Visit: Payer: Self-pay | Admitting: Cardiology

## 2015-08-12 DIAGNOSIS — I5032 Chronic diastolic (congestive) heart failure: Secondary | ICD-10-CM

## 2015-08-12 MED ORDER — POTASSIUM CHLORIDE ER 10 MEQ PO TBCR: 20.0000 meq | EXTENDED_RELEASE_TABLET | Freq: Two times a day (BID) | ORAL | Status: DC

## 2015-08-17 ENCOUNTER — Ambulatory Visit: Payer: Commercial Managed Care - HMO | Admitting: Podiatry

## 2015-08-18 ENCOUNTER — Other Ambulatory Visit (HOSPITAL_COMMUNITY): Payer: Self-pay | Admitting: *Deleted

## 2015-08-20 ENCOUNTER — Other Ambulatory Visit: Payer: Self-pay | Admitting: Cardiology

## 2015-09-22 ENCOUNTER — Inpatient Hospital Stay (HOSPITAL_COMMUNITY): Admission: RE | Admit: 2015-09-22 | Payer: Commercial Managed Care - HMO | Source: Ambulatory Visit

## 2015-10-06 DIAGNOSIS — N183 Chronic kidney disease, stage 3 (moderate): Secondary | ICD-10-CM | POA: Diagnosis not present

## 2015-10-06 DIAGNOSIS — I10 Essential (primary) hypertension: Secondary | ICD-10-CM | POA: Diagnosis not present

## 2015-10-06 DIAGNOSIS — E1122 Type 2 diabetes mellitus with diabetic chronic kidney disease: Secondary | ICD-10-CM | POA: Diagnosis not present

## 2015-10-06 DIAGNOSIS — J449 Chronic obstructive pulmonary disease, unspecified: Secondary | ICD-10-CM | POA: Diagnosis not present

## 2015-10-06 DIAGNOSIS — Z7984 Long term (current) use of oral hypoglycemic drugs: Secondary | ICD-10-CM | POA: Diagnosis not present

## 2015-10-06 DIAGNOSIS — E1165 Type 2 diabetes mellitus with hyperglycemia: Secondary | ICD-10-CM | POA: Diagnosis not present

## 2015-10-06 DIAGNOSIS — E1140 Type 2 diabetes mellitus with diabetic neuropathy, unspecified: Secondary | ICD-10-CM | POA: Diagnosis not present

## 2015-10-12 ENCOUNTER — Encounter: Payer: Self-pay | Admitting: Adult Health

## 2015-10-12 ENCOUNTER — Ambulatory Visit (INDEPENDENT_AMBULATORY_CARE_PROVIDER_SITE_OTHER): Payer: PPO | Admitting: Adult Health

## 2015-10-12 ENCOUNTER — Ambulatory Visit (INDEPENDENT_AMBULATORY_CARE_PROVIDER_SITE_OTHER)
Admission: RE | Admit: 2015-10-12 | Discharge: 2015-10-12 | Disposition: A | Discharge status: Home / Self Care | Payer: PPO | Source: Ambulatory Visit | Attending: Adult Health | Admitting: Adult Health

## 2015-10-12 VITALS — BP 128/82 | HR 83 | Temp 98.1°F | Ht 73.0 in | Wt 294.0 lb

## 2015-10-12 DIAGNOSIS — R0602 Shortness of breath: Secondary | ICD-10-CM | POA: Diagnosis not present

## 2015-10-12 DIAGNOSIS — E662 Morbid (severe) obesity with alveolar hypoventilation: Secondary | ICD-10-CM

## 2015-10-12 DIAGNOSIS — J449 Chronic obstructive pulmonary disease, unspecified: Secondary | ICD-10-CM | POA: Diagnosis not present

## 2015-10-12 DIAGNOSIS — J439 Emphysema, unspecified: Secondary | ICD-10-CM

## 2015-10-12 DIAGNOSIS — J9611 Chronic respiratory failure with hypoxia: Secondary | ICD-10-CM

## 2015-10-12 DIAGNOSIS — J438 Other emphysema: Secondary | ICD-10-CM | POA: Diagnosis not present

## 2015-10-12 DIAGNOSIS — I509 Heart failure, unspecified: Secondary | ICD-10-CM | POA: Diagnosis not present

## 2015-10-12 DIAGNOSIS — R05 Cough: Secondary | ICD-10-CM | POA: Diagnosis not present

## 2015-10-12 DIAGNOSIS — J9610 Chronic respiratory failure, unspecified whether with hypoxia or hypercapnia: Secondary | ICD-10-CM | POA: Diagnosis not present

## 2015-10-12 MED ORDER — AZITHROMYCIN 250 MG PO TABS: ORAL_TABLET | ORAL | Status: AC

## 2015-10-12 NOTE — Assessment & Plan Note (Signed)
Cont on O2  Please contact office for sooner follow up if symptoms do not improve or worsen or seek emergency care  Follow up with [name]  In 3 months and As needed

## 2015-10-12 NOTE — Assessment & Plan Note (Signed)
Wt loss  

## 2015-10-12 NOTE — Addendum Note (Signed)
Addended by: Osa Craver on: 10/12/2015 04:48 PM   Modules accepted: Orders

## 2015-10-12 NOTE — Progress Notes (Signed)
Subjective:    Patient ID: William Braun, male    DOB: 1933/01/11, 82 y.o.   MRN: [id number]  HPI  82 yo male COPD, OSA/OHS, Rt hemidiaphragm paralysis, and chronic respiratory failure.   TESTS: PFT 2011 >> FEV1 2.04 (66%), FEV1% 65 SNIFF 11/16/12 >> Rt hemidiaphragm paralysis ONO on BiPAP and 2 liters 11/19/12 >> test time 7 hrs 41 min.  Mean SpO2 93%, low SpO2 64%.  Spent 4 min 12 sec < 88% Echo 11/21/12 >>  Mod LVH, EF 60 to 65%, mod LA dilation, mod RV dilation, PAS 55 mmHg BiPAP 02/21/13 to 08/18/13 >> Used on 179 of 179 nights with average 9 hrs 56 min. Average AHI 1.9 with BiPAP 14/10 cm H2O. PFT 09/30/13 >> FEV1 1.78 (51%), FEV1% 73, TLC 4.67 (59%), DLCO 49%, no BD  PMHx >> HTN, HLD, DM, A fib, CVA 2008  PSHx, Medications, Allergies, Fhx, Shx reviewed.    10/12/2015 Follow up : COPD and OSA/OHS  Returns for 6 month follow up .  Pt uses VPAP on average 8-9 hours nightly, mask fits fine. Needs order for new machine. Had to get new machine , motor went bad in old machine . Says he can not make it.   Pt c/o SOB, chest congestion/tightness, sinus drainage/pressure, prod cough with gray mucus for few days.  Denies any wheezing, fever, nauea or vomiting., hemoptysis .  Says legs are not as swollen, diuretics really help him.  Last cxr 2014.  Flu and PVX are utd.  On oxygen 3l/m .      Past Medical History  Diagnosis Date  . Other and unspecified hyperlipidemia   . Unspecified essential hypertension   . Type II or unspecified type diabetes mellitus without mention of complication, not stated as uncontrolled   . Osteoarthritis   . Obesity   . History of thrombocytopenia   . Cerebrovascular disease   . Atrial fibrillation ([place])   . CLL (chronic lymphocytic leukemia) ([place]) 11/17/2014   Current Outpatient Prescriptions on File Prior to Visit  Medication Sig Dispense Refill  . albuterol (VENTOLIN HFA) 108 (90 BASE) MCG/ACT inhaler Inhale 2 puffs into the lungs every 6 (six)  hours as needed for wheezing or shortness of breath. 3 Inhaler 1  . amLODipine (NORVASC) 5 MG tablet Take 0.5 tablets (2.5 mg total) by mouth daily. 15 tablet 3  . budesonide-formoterol (SYMBICORT) 160-4.5 MCG/ACT inhaler Inhale 2 puffs into the lungs 2 (two) times daily. 2 puffs twice daily 1 Inhaler 6  . glimepiride (AMARYL) 2 MG tablet Take 4 mg by mouth daily.     Marland Kitchen HYDROcodone-acetaminophen (VICODIN) 5-500 MG per tablet Take 2 tablets by mouth every 6 (six) hours as needed for pain.     . metFORMIN (GLUCOPHAGE) 500 MG tablet Take 1,000 mg by mouth 2 (two) times daily with a meal.     . metolazone (ZAROXOLYN) 2.5 MG tablet Metolazone 2.5 mg twice a week as directed 60 tablet 3  . metoprolol succinate (TOPROL-XL) 25 MG 24 hr tablet Take 1/2 tab by mouth two times a day    . nitroGLYCERIN (NITROSTAT) 0.4 MG SL tablet Place 1 tablet (0.4 mg total) under the tongue every 5 (five) minutes as needed for chest pain. 25 tablet 3  . potassium chloride (K-DUR) 10 MEQ tablet Take 2 tablets (20 mEq total) by mouth 2 (two) times daily. 120 tablet 3  . silver sulfADIAZINE (SILVADENE) 1 % cream Apply 1 application topically daily. 50 g 0  .  simvastatin (ZOCOR) 20 MG tablet Take 20 mg by mouth at bedtime.      Marland Kitchen tiotropium (SPIRIVA) 18 MCG inhalation capsule Place 18 mcg into inhaler and inhale daily.    Marland Kitchen torsemide (DEMADEX) 20 MG tablet Take 4 tablets (80 mg total) by mouth 2 (two) times daily. 240 tablet 3  . warfarin (COUMADIN) 3 MG tablet Take by mouth as directed.     Marland Kitchen Respiratory Therapy Supplies (FLUTTER) DEVI Use as directed (Patient not taking: Reported on 10/12/2015) 1 each 0   No current facility-administered medications on file prior to visit.     Review of Systems Constitutional:   No  weight loss, night sweats,  Fevers, chills,  +fatigue, or  lassitude.  HEENT:   No headaches,  Difficulty swallowing,  Tooth/dental problems, or  Sore throat,                No sneezing, itching, ear ache,  nasal congestion, post nasal drip,   CV:  No chest pain,  Orthopnea, PND, swelling in lower extremities, anasarca, dizziness, palpitations, syncope.   GI  No heartburn, indigestion, abdominal pain, nausea, vomiting, diarrhea, change in bowel habits, loss of appetite, bloody stools.   Resp:    No chest wall deformity  Skin: no rash or lesions.  GU: no dysuria, change in color of urine, no urgency or frequency.  No flank pain, no hematuria   MS:  No joint pain or swelling.  No decreased range of motion.  No back pain.  Psych:  No change in mood or affect. No depression or anxiety.  No memory loss.         Objective:   Physical Exam  Filed Vitals:   10/12/15 1559  BP: 128/82  Pulse: 83  Temp: 98.1 F (36.7 C)  TempSrc: Oral  Height: 6\' 1"  (1.854 m)  Weight: 294 lb (133.358 kg)  SpO2: 91%   GEN: A/Ox3; pleasant , NAD, morbidly obese , in wc   HEENT:  [place]/AT,  EACs-clear, TMs-wnl, NOSE-clear, THROAT-clear, no lesions, no postnasal drip or exudate noted.   NECK:  Supple w/ fair ROM; no JVD; normal carotid impulses w/o bruits; no thyromegaly or nodules palpated; no lymphadenopathy.  RESP  Decreased BS in bases no accessory muscle use, no dullness to percussion  CARD:  RRR, no m/r/g  , tr  peripheral edema, pulses intact, no cyanosis or clubbing.  GI:   Soft & nt; nml bowel sounds; no organomegaly or masses detected.  Musco: Warm bil, no deformities or joint swelling noted.   Neuro: alert, no focal deficits noted.    Skin: Warm, no lesions or rashes        Assessment & Plan:

## 2015-10-12 NOTE — Assessment & Plan Note (Addendum)
Mild flare  Plan  Zpack take as directed. -to have on hold if symptoms worsen with discolored mucus.  Mucinex DM Twice daily As needed  Cough/congestion  Chest xray today .  Please contact office for sooner follow up if symptoms do not improve or worsen or seek emergency care  Follow up with [name]  In 3 months and As needed

## 2015-10-12 NOTE — Progress Notes (Signed)
Reviewed and agree with assessment/plan. 

## 2015-10-12 NOTE — Patient Instructions (Addendum)
Continue on VPAP At bedtime   Download in 1 month .  Work on weight loss.  Zpack take as directed. -to have on hold if symptoms worsen with discolored mucus.  Mucinex DM Twice daily As needed  Cough/congestion  Chest xray today .  Please contact office for sooner follow up if symptoms do not improve or worsen or seek emergency care  Follow up with [name]  In 3 months and As needed

## 2015-10-12 NOTE — Assessment & Plan Note (Signed)
Continue on VPAP At bedtime   Download in 1 month .  Work on weight loss.  Please contact office for sooner follow up if symptoms do not improve or worsen or seek emergency care  Follow up with [name]  In 3 months and As needed

## 2015-10-13 ENCOUNTER — Ambulatory Visit (INDEPENDENT_AMBULATORY_CARE_PROVIDER_SITE_OTHER): Payer: PPO | Admitting: Podiatry

## 2015-10-13 ENCOUNTER — Encounter: Payer: Self-pay | Admitting: Podiatry

## 2015-10-13 ENCOUNTER — Telehealth: Payer: Self-pay | Admitting: Adult Health

## 2015-10-13 VITALS — BP 134/87 | HR 89 | Resp 18

## 2015-10-13 DIAGNOSIS — E1149 Type 2 diabetes mellitus with other diabetic neurological complication: Secondary | ICD-10-CM

## 2015-10-13 DIAGNOSIS — L97511 Non-pressure chronic ulcer of other part of right foot limited to breakdown of skin: Secondary | ICD-10-CM

## 2015-10-13 DIAGNOSIS — L89891 Pressure ulcer of other site, stage 1: Secondary | ICD-10-CM | POA: Diagnosis not present

## 2015-10-13 NOTE — Telephone Encounter (Signed)
Notes Recorded by Melvenia Needles, NP on 10/13/2015 at 2:05 PM Mild fluid on CXR - appears similar  Cont on Demadex Cont w/ ov recs Please contact office for sooner follow up if symptoms do not improve or worsen or seek emergency care       Called and spoke to pt. Informed him of the results and recs per TP. Pt verbalized understanding and denied any further questions or concerns at this time.

## 2015-10-13 NOTE — Progress Notes (Signed)
Quick Note:  Called and spoke to pt. Informed him of the results and recs per TP. Pt verbalized understanding and denied any further questions or concerns at this time.   ______ 

## 2015-10-14 ENCOUNTER — Telehealth: Payer: Self-pay | Admitting: Adult Health

## 2015-10-14 DIAGNOSIS — G4733 Obstructive sleep apnea (adult) (pediatric): Secondary | ICD-10-CM

## 2015-10-14 NOTE — Telephone Encounter (Signed)
Order will be re-placed. William Braun is aware that this has been done. Nothing further was needed.

## 2015-10-19 ENCOUNTER — Encounter: Payer: Self-pay | Admitting: Podiatry

## 2015-10-19 ENCOUNTER — Encounter (HOSPITAL_COMMUNITY): Payer: Self-pay

## 2015-10-19 ENCOUNTER — Ambulatory Visit (HOSPITAL_COMMUNITY)
Admission: RE | Admit: 2015-10-19 | Discharge: 2015-10-19 | Disposition: A | Discharge status: Home / Self Care | Payer: PPO | Source: Ambulatory Visit | Attending: Cardiology | Admitting: Cardiology

## 2015-10-19 VITALS — BP 110/70 | HR 75 | Wt 294.0 lb

## 2015-10-19 DIAGNOSIS — I482 Chronic atrial fibrillation, unspecified: Secondary | ICD-10-CM

## 2015-10-19 DIAGNOSIS — I4891 Unspecified atrial fibrillation: Secondary | ICD-10-CM | POA: Diagnosis not present

## 2015-10-19 DIAGNOSIS — Z7901 Long term (current) use of anticoagulants: Secondary | ICD-10-CM | POA: Insufficient documentation

## 2015-10-19 DIAGNOSIS — E662 Morbid (severe) obesity with alveolar hypoventilation: Secondary | ICD-10-CM | POA: Diagnosis not present

## 2015-10-19 DIAGNOSIS — I509 Heart failure, unspecified: Secondary | ICD-10-CM | POA: Diagnosis not present

## 2015-10-19 DIAGNOSIS — I5032 Chronic diastolic (congestive) heart failure: Secondary | ICD-10-CM | POA: Insufficient documentation

## 2015-10-19 DIAGNOSIS — J9610 Chronic respiratory failure, unspecified whether with hypoxia or hypercapnia: Secondary | ICD-10-CM | POA: Diagnosis not present

## 2015-10-19 DIAGNOSIS — D472 Monoclonal gammopathy: Secondary | ICD-10-CM | POA: Diagnosis not present

## 2015-10-19 DIAGNOSIS — I130 Hypertensive heart and chronic kidney disease with heart failure and stage 1 through stage 4 chronic kidney disease, or unspecified chronic kidney disease: Secondary | ICD-10-CM | POA: Diagnosis not present

## 2015-10-19 DIAGNOSIS — J438 Other emphysema: Secondary | ICD-10-CM | POA: Diagnosis not present

## 2015-10-19 DIAGNOSIS — Z87891 Personal history of nicotine dependence: Secondary | ICD-10-CM | POA: Insufficient documentation

## 2015-10-19 DIAGNOSIS — Z79899 Other long term (current) drug therapy: Secondary | ICD-10-CM | POA: Diagnosis not present

## 2015-10-19 DIAGNOSIS — Z7984 Long term (current) use of oral hypoglycemic drugs: Secondary | ICD-10-CM | POA: Insufficient documentation

## 2015-10-19 DIAGNOSIS — Z8673 Personal history of transient ischemic attack (TIA), and cerebral infarction without residual deficits: Secondary | ICD-10-CM | POA: Insufficient documentation

## 2015-10-19 DIAGNOSIS — E785 Hyperlipidemia, unspecified: Secondary | ICD-10-CM | POA: Insufficient documentation

## 2015-10-19 DIAGNOSIS — J449 Chronic obstructive pulmonary disease, unspecified: Secondary | ICD-10-CM | POA: Diagnosis not present

## 2015-10-19 DIAGNOSIS — N189 Chronic kidney disease, unspecified: Secondary | ICD-10-CM | POA: Diagnosis not present

## 2015-10-19 DIAGNOSIS — G4733 Obstructive sleep apnea (adult) (pediatric): Secondary | ICD-10-CM | POA: Diagnosis not present

## 2015-10-19 DIAGNOSIS — E1122 Type 2 diabetes mellitus with diabetic chronic kidney disease: Secondary | ICD-10-CM | POA: Diagnosis not present

## 2015-10-19 DIAGNOSIS — R0602 Shortness of breath: Secondary | ICD-10-CM | POA: Diagnosis not present

## 2015-10-19 LAB — BASIC METABOLIC PANEL
Anion gap: 13 (ref 5–15)
BUN: 24 mg/dL — AB (ref 6–20)
CO2: 26 mmol/L (ref 22–32)
CREATININE: 1.33 mg/dL — AB (ref 0.61–1.24)
Calcium: 8.9 mg/dL (ref 8.9–10.3)
Chloride: 101 mmol/L (ref 101–111)
GFR calc non Af Amer: 49 mL/min — ABNORMAL LOW (ref >60)
GFR, EST AFRICAN AMERICAN: 57 mL/min — AB (ref >60)
Glucose, Bld: 155 mg/dL — ABNORMAL HIGH (ref 65–99)
Potassium: 4.2 mmol/L (ref 3.5–5.1)
SODIUM: 140 mmol/L (ref 135–145)

## 2015-10-19 MED ORDER — TORSEMIDE 100 MG PO TABS: 100.0000 mg | ORAL_TABLET | Freq: Two times a day (BID) | ORAL | Status: DC

## 2015-10-19 NOTE — Patient Instructions (Signed)
Increase Torsemide to 100 mg Twice daily, we have sent you in a new prescription for 100 mg tablets  Labs today  Labs in 2 weeks  Your physician recommends that you schedule a follow-up appointment in: 6 weeks

## 2015-10-19 NOTE — Progress Notes (Signed)
Patient ID: William Braun, male   DOB: 1933/04/03, 82 y.o.   MRN: [id number] PCP: Dr. Michail Sermon Pulmonary: Dr Halford Chessman  82 yo with h/o HTN, CVA, diabetes, obesity-hypoventilation syndrome, diastolic CHF, and chronic atrial fibrillation presents for cardiology followup.  He has OHS and uses BIPAP at night and oxygen during the day.  He has atrial fibrillation with reasonable rate control and is on coumadin.   His legs continue to feel weak (normal ABIs in 10/16).    He returns for follow up. Limited mobility. Essentially chair bound, walks short distances.  He is short of breath dressing and with even short distances walking; this is chronic.  No chest pain. +Orthopnea. No melena/BRBPR. Still with significant LE edema but improved on metolazone that was started at last appointment.  Weight is down about 18 lbs at home since starting metolazone (though dyspnea is really unchanged).  No lightheadedness.  Recent CXR showed pulmonary edema.   Labs (2/10): creatinine 1.15, BNP 73, TSH normal  Labs (9/12): K 4.1, creatinine 1.4, BNP 90 Labs (11/12): K 4.2, creatinine 1.9 Labs (2/13): K 4.2, creatinine 1.3, proBNP 49, LDL 39, HDL 35 Labs (4/13): K 4, creatinine 1.5, BNP 48 Labs (3/14): K 4 => 3.9, creatinine 1.4 => 1.6 Labs (4/14): K 4, creatinine 1.5 Labs (5/15): K 4.2, creatinine 1.4, BNP 49 Labs (8/15): K 3.7, creatinine 1.4 Labs (10/15): K 3.6, creatinine 1.29 Labs (8/16): K 4.7, creatinine 1.34, HCT 47.9 Labs (11/16): K 4, creatinine 1.23 => 1.39, BNP 78  Allergies (verified):  1) ! Quinine   Past Medical History:  1. Atrial Fibrillation: apparently developed post-op right TKR in 2/10. Pt was started on coumadin. Now chronic.  2. Diabetes Type 2  3. Hyperlipidemia  4. Hypertension  5. Cerebrovascular Disease-CVA-2008  6. Obesity  7. Osteoarthritis left knee, s/p R TKR  8. History of thrombocytopenia  9. Diastolic CHF: Echo ([id number]) with EF 55%, moderately dilated RV with moderate RV systolic  dysfunction, PA systolic pressure 36 mmHg.  Echo (3/14) with EF 60-65%, moderate LVH, moderate RV dilation with normal systolic function, PA systolic pressure [id number] mmHg.  Echo (11/16) with EF 60-65%, mild LVH, mild aortic stenosis, mildly dilated aortic root 4.3 cm, PASP 71 mmHg.  10. Obesity hypoventilation syndrome/OSA: BIPAP at night, O2 during the day with exertion. PFTs (1/15) with FEV1 49%, ratio 97%, TLC 59%, DLCO 49% => mixed obstructive/restrictive.  11. Prolonged hospitalization in fall 2011 with Strep agalactiae bacteremia, MRSA PNA, and septic shock.  12. Lexiscan myoview (11/12) with EF 65%, no ischemia or infarction.  13. 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  14. CKD 15. MGUS: Dr. Marin Olp 16. ABIs (7/15) normal.  ABIs (10/16) normal.   Family History:  Father: deceased MVA  Mother: deceased MVA   Social History:  Married  Tobacco Use - Former. 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   Current Outpatient Prescriptions  Medication Sig Dispense Refill  . albuterol (VENTOLIN HFA) 108 (90 BASE) MCG/ACT inhaler Inhale 2 puffs into the lungs every 6 (six) hours as needed for wheezing or shortness of breath. 3 Inhaler 1  . amLODipine (NORVASC) 5 MG tablet Take 0.5 tablets (2.5 mg total) by mouth daily. 15 tablet 3  . benzonatate (TESSALON) 200 MG capsule Take 200 mg by mouth 3 (three) times daily as needed for cough.    . budesonide-formoterol (SYMBICORT) 160-4.5 MCG/ACT inhaler Inhale 2  puffs into the lungs 2 (two) times daily. 2 puffs twice daily 1 Inhaler 6  . gabapentin (NEURONTIN) 100 MG capsule Take 100 mg by mouth at bedtime.    Marland Kitchen glimepiride (AMARYL) 2 MG tablet Take 4 mg by mouth daily.     Marland Kitchen HYDROcodone-acetaminophen (VICODIN) 5-500 MG per tablet Take 2 tablets by mouth every 6 (six) hours as needed for pain.     . metFORMIN (GLUCOPHAGE) 500 MG tablet Take 1,000 mg by mouth 2 (two)  times daily with a meal.     . metolazone (ZAROXOLYN) 2.5 MG tablet Metolazone 2.5 mg twice a week as directed 60 tablet 3  . metoprolol succinate (TOPROL-XL) 25 MG 24 hr tablet Take 1/2 tab by mouth two times a day    . potassium chloride (K-DUR) 10 MEQ tablet Take 2 tablets (20 mEq total) by mouth 2 (two) times daily. 120 tablet 3  . silver sulfADIAZINE (SILVADENE) 1 % cream Apply 1 application topically daily. 50 g 0  . simvastatin (ZOCOR) 20 MG tablet Take 20 mg by mouth at bedtime.      Marland Kitchen tiotropium (SPIRIVA) 18 MCG inhalation capsule Place 18 mcg into inhaler and inhale daily.    Marland Kitchen torsemide (DEMADEX) 100 MG tablet Take 1 tablet (100 mg total) by mouth 2 (two) times daily. 60 tablet 3  . warfarin (COUMADIN) 3 MG tablet Take by mouth as directed.     . nitroGLYCERIN (NITROSTAT) 0.4 MG SL tablet Place 1 tablet (0.4 mg total) under the tongue every 5 (five) minutes as needed for chest pain. (Patient not taking: Reported on 10/19/2015) 25 tablet 3   No current facility-administered medications for this encounter.    BP 110/70 mmHg  Pulse 75  Wt 294 lb (133.358 kg)  SpO2 93% General: NAD, obese. Arrived in a scooter.  Neck: Thick, JVP 8-9 cm (difficult), no thyromegaly or thyroid nodule.  Lungs: Crackles at bases bilaterally.   CV: Nondisplaced PMI.  Heart irregular S1/S2, no S3/S4, 2/6 HSM LLSB. 2+ edema 1/2 to knees bilaterally (improved).  No carotid bruit.  Unable to feel pedal pulses in setting of edema. Abdomen: Soft, nontender, no hepatosplenomegaly, no distention.  Neurologic: Alert and oriented x 3.  Psych: Normal affect. Extremities: No clubbing or cyanosis.   Assessment/Plan: 1. Chronic diastolic CHF: Probably with a component of pulmonary arterial HTN from OHS/OSA with right heart failure.  He has NYHA class IIIb symptoms, stable.  Volume status looks better on metolazone, less leg edema, but still some residual volume overload.  - Increase torsemide to 100 mg bid.   -  Continue metolazone 2.5 mg twice a week on Tuesday and Friday, take extra KCl 40 on metolazone days.  - BMET today and in 14 days.  - I will have him continue to wear graded compression stockings.  2. OHS/OSA: Continue nocturnal Bipap and oxygen during the day.   3. Atrial fibrillation: Reasonable rate control on Toprol XL.  Continue coumadin. INR per PCP.  4. HTN: BP stable.  Followup in 6 wks.    [name] 10/19/2015

## 2015-10-19 NOTE — Progress Notes (Signed)
Patient ID: William Braun, male   DOB: 25-Nov-1932, 82 y.o.   MRN: [id number]  Subjective: 82 year old male presents the office a follow-up evaluation of a wound to the bottom of his right hallux. He states that he believes that the wounds are healing. He has not noticed much drainage she states is also decreasing. Denies any pus. Denies any swelling or redness. No warmth. He has continued with Silvadene dressing changes daily. He denies any systemic complaints such as fevers, chills, nausea, vomiting. No calf pain, chest pain, shortness of breath. No other complaints at this time.  Objective: AAO 3, NAD, presents in wheelchair DP/PT pulses 1/4, CRT less than 3 seconds Protective sensation decreased with Simms Weinstein monofilament On the plantar aspect of the right hallux with an annular type ulceration measuring 0.7 x 0.3 which appears to be increase in size compared to last upon it. There is no probing, undermining or tunneling. There is no significant edema, erythema, drainage or purulence or malodor. The wound on the lateral aspect of the hallux has resolved and is currently not present. Small hyperkeratotic tissue overlies that area which after debridement there is no underlying ulceration, drainage or other signs of infection. No other open lesions or pre-ulcerative lesions. There is no pain with calf compression, swelling, warmth, erythema.  Assessment: 82 year old male with ulceration right hallux   Plan: -Treatment options discussed including all alternatives, risks, and complications -Wound sharply debrided without complications to healthy, granular wound base. Silvadene was applied.  Continue daily dressing changes. Offloading! -Monitor for any clinical signs or symptoms of infection and directed to call the office immediately should any occur or go to the ER. -Follow-up as scheduled or sooner if any problems arise. In the meantime, encouraged to call the office with any questions,  concerns, change in symptoms.   ABI 06/2615: Technically challenging study secondary to respirations, and inability to recline. No evidence of segmental lower extremity arterial disease at rest, bilaterally. Normal ABI's, bilaterally. Bilateral great toe pressures are normal.  William Braun, DPM

## 2015-10-21 ENCOUNTER — Ambulatory Visit (HOSPITAL_BASED_OUTPATIENT_CLINIC_OR_DEPARTMENT_OTHER): Payer: PPO | Admitting: Family

## 2015-10-21 ENCOUNTER — Encounter: Payer: Self-pay | Admitting: Hematology & Oncology

## 2015-10-21 ENCOUNTER — Other Ambulatory Visit (HOSPITAL_BASED_OUTPATIENT_CLINIC_OR_DEPARTMENT_OTHER): Payer: PPO

## 2015-10-21 VITALS — BP 90/57 | HR 103 | Temp 97.4°F | Resp 18 | Ht 73.0 in

## 2015-10-21 DIAGNOSIS — R0602 Shortness of breath: Secondary | ICD-10-CM | POA: Diagnosis not present

## 2015-10-21 DIAGNOSIS — I509 Heart failure, unspecified: Secondary | ICD-10-CM

## 2015-10-21 DIAGNOSIS — R609 Edema, unspecified: Secondary | ICD-10-CM

## 2015-10-21 DIAGNOSIS — Z7901 Long term (current) use of anticoagulants: Secondary | ICD-10-CM | POA: Diagnosis not present

## 2015-10-21 DIAGNOSIS — G629 Polyneuropathy, unspecified: Secondary | ICD-10-CM | POA: Diagnosis not present

## 2015-10-21 DIAGNOSIS — C9110 Chronic lymphocytic leukemia of B-cell type not having achieved remission: Secondary | ICD-10-CM

## 2015-10-21 LAB — CHCC SATELLITE - SMEAR

## 2015-10-21 LAB — CBC WITH DIFFERENTIAL (CANCER CENTER ONLY)
BASO#: 0.1 10*3/uL (ref 0.0–0.2)
BASO%: 0.5 % (ref 0.0–2.0)
EOS ABS: 0.5 10*3/uL (ref 0.0–0.5)
EOS%: 1.7 % (ref 0.0–7.0)
HEMATOCRIT: 50.1 % — AB (ref 38.7–49.9)
HEMOGLOBIN: 17.1 g/dL (ref 13.0–17.1)
LYMPH#: 16.5 10*3/uL — ABNORMAL HIGH (ref 0.9–3.3)
LYMPH%: 61.5 % — ABNORMAL HIGH (ref 14.0–48.0)
MCH: 30.7 pg (ref 28.0–33.4)
MCHC: 34.1 g/dL (ref 32.0–35.9)
MCV: 90 fL (ref 82–98)
MONO#: 1.7 10*3/uL — AB (ref 0.1–0.9)
MONO%: 6.2 % (ref 0.0–13.0)
NEUT%: 30.1 % — ABNORMAL LOW (ref 40.0–80.0)
NEUTROS ABS: 8.1 10*3/uL — AB (ref 1.5–6.5)
Platelets: 117 10*3/uL — ABNORMAL LOW (ref 145–400)
RBC: 5.57 10*6/uL (ref 4.20–5.70)
RDW: 17.9 % — ABNORMAL HIGH (ref 11.1–15.7)
WBC: 26.9 10*3/uL — AB (ref 4.0–10.0)

## 2015-10-21 LAB — COMPREHENSIVE METABOLIC PANEL
ALT: 16 U/L (ref 0–55)
ANION GAP: 15 meq/L — AB (ref 3–11)
AST: 20 U/L (ref 5–34)
Albumin: 3.5 g/dL (ref 3.5–5.0)
Alkaline Phosphatase: 69 U/L (ref 40–150)
BILIRUBIN TOTAL: 1.38 mg/dL — AB (ref 0.20–1.20)
BUN: 36.5 mg/dL — ABNORMAL HIGH (ref 7.0–26.0)
CALCIUM: 9.8 mg/dL (ref 8.4–10.4)
CO2: 32 mEq/L — ABNORMAL HIGH (ref 22–29)
CREATININE: 1.8 mg/dL — AB (ref 0.7–1.3)
Chloride: 95 mEq/L — ABNORMAL LOW (ref 98–109)
EGFR: 34 mL/min/{1.73_m2} — ABNORMAL LOW (ref >90)
Glucose: 149 mg/dl — ABNORMAL HIGH (ref 70–140)
Potassium: 4.3 mEq/L (ref 3.5–5.1)
Sodium: 142 mEq/L (ref 136–145)
TOTAL PROTEIN: 8.5 g/dL — AB (ref 6.4–8.3)

## 2015-10-21 LAB — TECHNOLOGIST REVIEW CHCC SATELLITE

## 2015-10-21 NOTE — Progress Notes (Signed)
Hematology and Oncology Follow Up Visit  William Braun [id number] [birth year] 82 y.o. 10/21/2015   Principle Diagnosis:  CLL-stage A  Current Therapy:   Observation    Interim History:  William Braun is here today with his wife for a follow-up. He is having some fatigue. He states that his SOB is the same. He is now on 2.5 liters supplemental O2. He wears this about 80% of the time.  He has had a productive cough and was seen by pulmonology last week and had a chest xray. This showed CHF and he has since had his diuretics adjusted.  He has had no fever, chills, n/v, rash, dizziness, chest pain, palpitations, abdominal pain or changes in bowel or bladder habits.   No lymphadenopathy found on exam. His WBC count continues to slowly creep up and is now 26.9.  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 He has chronic swelling in his lower extremities.  He has maintained a good appetite and is staying well hydrated. His weight is unchanged.   Medications:    Medication List       This list is accurate as of: 10/21/15  4:48 PM.  Always use your most recent med list.               albuterol 108 (90 Base) MCG/ACT inhaler  Commonly known as:  VENTOLIN HFA  Inhale 2 puffs into the lungs every 6 (six) hours as needed for wheezing or shortness of breath.     amLODipine 5 MG tablet  Commonly known as:  NORVASC  Take 0.5 tablets (2.5 mg total) by mouth daily.     benzonatate 200 MG capsule  Commonly known as:  TESSALON  Take 200 mg by mouth 3 (three) times daily as needed for cough.     budesonide-formoterol 160-4.5 MCG/ACT inhaler  Commonly known as:  SYMBICORT  Inhale 2 puffs into the lungs 2 (two) times daily. 2 puffs twice daily     gabapentin 100 MG capsule  Commonly known as:  NEURONTIN  Take 100 mg by mouth at bedtime.     glimepiride 2 MG tablet  Commonly known as:  AMARYL  Take 4 mg by mouth daily.     HYDROcodone-acetaminophen 5-500 MG tablet  Commonly known as:   VICODIN  Take 2 tablets by mouth every 6 (six) hours as needed for pain.     metFORMIN 500 MG tablet  Commonly known as:  GLUCOPHAGE  Take 1,000 mg by mouth 2 (two) times daily with a meal.     metolazone 2.5 MG tablet  Commonly known as:  ZAROXOLYN  Metolazone 2.5 mg twice a week as directed     metoprolol succinate 25 MG 24 hr tablet  Commonly known as:  TOPROL-XL  Take 1/2 tab by mouth two times a day     nitroGLYCERIN 0.4 MG SL tablet  Commonly known as:  NITROSTAT  Place 1 tablet (0.4 mg total) under the tongue every 5 (five) minutes as needed for chest pain.     potassium chloride 10 MEQ tablet  Commonly known as:  K-DUR  Take 2 tablets (20 mEq total) by mouth 2 (two) times daily.     silver sulfADIAZINE 1 % cream  Commonly known as:  SILVADENE  Apply 1 application topically daily.     simvastatin 20 MG tablet  Commonly known as:  ZOCOR  Take 20 mg by mouth at bedtime.     tiotropium 18 MCG inhalation  capsule  Commonly known as:  SPIRIVA  Place 18 mcg into inhaler and inhale daily.     torsemide 100 MG tablet  Commonly known as:  DEMADEX  Take 1 tablet (100 mg total) by mouth 2 (two) times daily.     warfarin 3 MG tablet  Commonly known as:  COUMADIN  Take by mouth as directed.        Allergies:  Allergies  Allergen Reactions  . Quinine     Past Medical History, Surgical history, Social history, and Family History were reviewed and updated.  Review of Systems: All other 10 point review of systems is negative.   Physical Exam:  height is 6\' 1"  (1.854 m). His oral temperature is 97.4 F (36.3 C). His blood pressure is 90/57 and his pulse is 103. His respiration is 18.   Wt Readings from Last 3 Encounters:  10/19/15 294 lb (133.358 kg)  10/12/15 294 lb (133.358 kg)  07/20/15 299 lb (135.626 kg)    Ocular: Sclerae unicteric, pupils equal, round and reactive to light Ear-nose-throat: Oropharynx clear, dentition fair Lymphatic: No cervical  supraclavicular or axillary adenopathy Lungs no rales or rhonchi, good excursion bilaterally Heart regular rate and rhythm, no murmur appreciated Abd soft, nontender, positive bowel sounds, no liver or spleen tip palpated on exam MSK no focal spinal tenderness, no joint edema Neuro: non-focal, well-oriented, appropriate affect Breasts: Deferred  Lab Results  Component Value Date   WBC 26.9* 10/21/2015   HGB 17.1 10/21/2015   HCT 50.1* 10/21/2015   MCV 90 10/21/2015   PLT 117* 10/21/2015   Lab Results  Component Value Date   FERRITIN 157 06/22/2010   IRON 15* 06/22/2010   TIBC 182* 06/22/2010   UIBC 167 06/22/2010   IRONPCTSAT 8* 06/22/2010   Lab Results  Component Value Date   RETICCTPCT 1.9 06/22/2010   RBC 5.57 10/21/2015   Lab Results  Component Value Date   KPAFRELGTCHN 10.70* 11/17/2014   LAMBDASER 7.06* 11/17/2014   KAPLAMBRATIO 1.52 11/17/2014   Lab Results  Component Value Date   IGGSERUM 2020* 11/17/2014   IGA 448* 11/17/2014   IGMSERUM 55 11/17/2014   Lab Results  Component Value Date   TOTALPROTELP 7.8 04/23/2014   ALBUMINELP 45.6* 04/23/2014   A1GS 5.7* 04/23/2014   A2GS 10.1 04/23/2014   BETS 7.0 04/23/2014   BETA2SER 6.0 04/23/2014   GAMS 25.6* 04/23/2014   MSPIKE NOT DET 04/23/2014   SPEI * 04/23/2014     Chemistry      Component Value Date/Time   NA 142 10/21/2015 1137   NA 140 10/19/2015 1114   NA 139 04/23/2014 1019   K 4.3 10/21/2015 1137   K 4.2 10/19/2015 1114   K 3.7 04/23/2014 1019   CL 101 10/19/2015 1114   CL 93* 04/23/2014 1019   CO2 32* 10/21/2015 1137   CO2 26 10/19/2015 1114   CO2 31 04/23/2014 1019   BUN 36.5* 10/21/2015 1137   BUN 24* 10/19/2015 1114   BUN 19 04/23/2014 1019   CREATININE 1.8* 10/21/2015 1137   CREATININE 1.33* 10/19/2015 1114   CREATININE 1.4* 04/23/2014 1019      Component Value Date/Time   CALCIUM 9.8 10/21/2015 1137   CALCIUM 8.9 10/19/2015 1114   CALCIUM 8.2 04/23/2014 1019   ALKPHOS 69  10/21/2015 1137   ALKPHOS 66 04/22/2015 1318   ALKPHOS 55 04/23/2014 1019   AST 20 10/21/2015 1137   AST 17 04/22/2015 1318   AST 21 04/23/2014  1019   ALT 16 10/21/2015 1137   ALT 12 04/22/2015 1318   ALT 12 04/23/2014 1019   BILITOT 1.38* 10/21/2015 1137   BILITOT 0.8 04/22/2015 1318   BILITOT 1.60 04/23/2014 1019     Impression and Plan: William Braun is 82 yo male with stage A CLL documented by flow cytometry. He has multiple health issues including CHF. He has had no problem with infections and is asymptomatic with his CLL at this time. His WBC and lymphocyte counts continue to slowly increase.  We will see what his protein studies show. We will plan to see him back in 6 months for labs and follow-up.  Both he and his wife know to call with any questions or concerns. We can certainly see him sooner if need be.   William Bottom, NP 2/1/20174:48 PM

## 2015-10-22 LAB — IGG, IGA, IGM
IGA/IMMUNOGLOBULIN A, SERUM: 406 mg/dL (ref 61–437)
IGM (IMMUNOGLOBIN M), SRM: 56 mg/dL (ref 15–143)

## 2015-10-22 LAB — KAPPA/LAMBDA LIGHT CHAINS
IG LAMBDA FREE LIGHT CHAIN: 92.08 mg/L — AB (ref 5.71–26.30)
Ig Kappa Free Light Chain: 104.37 mg/L — ABNORMAL HIGH (ref 3.30–19.40)
Kappa/Lambda FluidC Ratio: 1.13 (ref 0.26–1.65)

## 2015-10-22 LAB — BETA 2 MICROGLOBULIN, SERUM: Beta-2: 5.5 mg/L — ABNORMAL HIGH (ref 0.6–2.4)

## 2015-10-23 LAB — PROTEIN ELECTROPHORESIS, SERUM, WITH REFLEX
A/G RATIO SPE: 0.8 (ref 0.7–1.7)
ALBUMIN: 3.3 g/dL (ref 2.9–4.4)
ALPHA 1: 0.3 g/dL (ref 0.0–0.4)
Alpha 2: 0.9 g/dL (ref 0.4–1.0)
BETA: 1.2 g/dL (ref 0.7–1.3)
Gamma Globulin: 1.9 g/dL — ABNORMAL HIGH (ref 0.4–1.8)
Globulin, Total: 4.3 g/dL — ABNORMAL HIGH (ref 2.2–3.9)
Total Protein: 7.6 g/dL (ref 6.0–8.5)

## 2015-10-28 DIAGNOSIS — Z7901 Long term (current) use of anticoagulants: Secondary | ICD-10-CM | POA: Diagnosis not present

## 2015-11-02 ENCOUNTER — Ambulatory Visit (HOSPITAL_COMMUNITY)
Admission: RE | Admit: 2015-11-02 | Discharge: 2015-11-02 | Disposition: A | Discharge status: Home / Self Care | Payer: PPO | Source: Ambulatory Visit | Attending: Internal Medicine | Admitting: Internal Medicine

## 2015-11-02 DIAGNOSIS — I5032 Chronic diastolic (congestive) heart failure: Secondary | ICD-10-CM | POA: Diagnosis not present

## 2015-11-02 LAB — BASIC METABOLIC PANEL
Anion gap: 19 — ABNORMAL HIGH (ref 5–15)
BUN: 51 mg/dL — AB (ref 6–20)
CO2: 33 mmol/L — ABNORMAL HIGH (ref 22–32)
CREATININE: 1.85 mg/dL — AB (ref 0.61–1.24)
Calcium: 8.9 mg/dL (ref 8.9–10.3)
Chloride: 88 mmol/L — ABNORMAL LOW (ref 101–111)
GFR calc Af Amer: 38 mL/min — ABNORMAL LOW (ref >60)
GFR, EST NON AFRICAN AMERICAN: 33 mL/min — AB (ref >60)
GLUCOSE: 176 mg/dL — AB (ref 65–99)
Potassium: 3.5 mmol/L (ref 3.5–5.1)
SODIUM: 140 mmol/L (ref 135–145)

## 2015-11-06 ENCOUNTER — Encounter: Payer: Self-pay | Admitting: Podiatry

## 2015-11-06 ENCOUNTER — Ambulatory Visit (INDEPENDENT_AMBULATORY_CARE_PROVIDER_SITE_OTHER): Payer: PPO | Admitting: Podiatry

## 2015-11-06 VITALS — BP 104/44 | HR 71 | Resp 18

## 2015-11-06 DIAGNOSIS — L97511 Non-pressure chronic ulcer of other part of right foot limited to breakdown of skin: Secondary | ICD-10-CM

## 2015-11-06 DIAGNOSIS — E1149 Type 2 diabetes mellitus with other diabetic neurological complication: Secondary | ICD-10-CM

## 2015-11-06 DIAGNOSIS — L89891 Pressure ulcer of other site, stage 1: Secondary | ICD-10-CM | POA: Diagnosis not present

## 2015-11-11 ENCOUNTER — Encounter: Payer: Self-pay | Admitting: Podiatry

## 2015-11-11 NOTE — Progress Notes (Signed)
Patient ID: William Braun, male   DOB: 24-Apr-1933, 82 y.o.   MRN: [id number]  Subjective: 82 year old male presents the office a follow-up evaluation of a wound to the bottom of his right hallux. He denies any drainage or pus coming from the area and denies any surrounding redness or drainage. No swelling to the foot. Its been using Silvadene dressings daily. He denies any systemic complaints such as fevers, chills, nausea, vomiting. No calf pain, chest pain, shortness of breath. No other complaints at this time.  Objective: AAO 3, NAD, presents in wheelchair DP/PT pulses 1/4, CRT less than 3 seconds Protective sensation decreased with Simms Weinstein monofilament On the plantar aspect of the right hallux with an annular type ulceration measuring 1 x 0.5 x 0.2 which is increased compared to last appointment. There is no probing, undermining or tunneling. There is no surrounding erythema, ascending synovitis. No edema. No purulence or drainage or any malodor. No other open lesions or pre-ulcer lesions identified today.  There is no pain with calf compression, swelling, warmth, erythema.  Assessment: 82 year old male with ulceration right hallux   Plan: -Treatment options discussed including all alternatives, risks, and complications -Wound sharply debrided without complications to healthy, granular wound base. Prisma was applied. I gave him the remainder of the dressing today to use. Continue daily dressing changes. Offloading! -Monitor for any clinical signs or symptoms of infection and directed to call the office immediately should any occur or go to the ER. -Follow-up as scheduled or sooner if any problems arise. In the meantime, encouraged to call the office with any questions, concerns, change in symptoms.   ABI 06/2615: Technically challenging study secondary to respirations, and inability to recline. No evidence of segmental lower extremity arterial disease at rest, bilaterally. Normal  ABI's, bilaterally. Bilateral great toe pressures are normal.  Celesta Gentile, DPM

## 2015-11-12 DIAGNOSIS — J438 Other emphysema: Secondary | ICD-10-CM | POA: Diagnosis not present

## 2015-11-12 DIAGNOSIS — J9610 Chronic respiratory failure, unspecified whether with hypoxia or hypercapnia: Secondary | ICD-10-CM | POA: Diagnosis not present

## 2015-11-12 DIAGNOSIS — J449 Chronic obstructive pulmonary disease, unspecified: Secondary | ICD-10-CM | POA: Diagnosis not present

## 2015-11-12 DIAGNOSIS — I509 Heart failure, unspecified: Secondary | ICD-10-CM | POA: Diagnosis not present

## 2015-11-12 DIAGNOSIS — E662 Morbid (severe) obesity with alveolar hypoventilation: Secondary | ICD-10-CM | POA: Diagnosis not present

## 2015-11-12 DIAGNOSIS — R0602 Shortness of breath: Secondary | ICD-10-CM | POA: Diagnosis not present

## 2015-11-13 ENCOUNTER — Ambulatory Visit (INDEPENDENT_AMBULATORY_CARE_PROVIDER_SITE_OTHER): Payer: PPO | Admitting: Pulmonary Disease

## 2015-11-13 ENCOUNTER — Encounter: Payer: Self-pay | Admitting: Pulmonary Disease

## 2015-11-13 ENCOUNTER — Encounter (INDEPENDENT_AMBULATORY_CARE_PROVIDER_SITE_OTHER): Payer: Self-pay

## 2015-11-13 VITALS — BP 112/80 | HR 74 | Ht 75.0 in | Wt 293.0 lb

## 2015-11-13 DIAGNOSIS — G4733 Obstructive sleep apnea (adult) (pediatric): Secondary | ICD-10-CM

## 2015-11-13 DIAGNOSIS — J439 Emphysema, unspecified: Secondary | ICD-10-CM | POA: Diagnosis not present

## 2015-11-13 DIAGNOSIS — J432 Centrilobular emphysema: Secondary | ICD-10-CM

## 2015-11-13 DIAGNOSIS — J9611 Chronic respiratory failure with hypoxia: Secondary | ICD-10-CM

## 2015-11-13 DIAGNOSIS — E662 Morbid (severe) obesity with alveolar hypoventilation: Secondary | ICD-10-CM

## 2015-11-13 DIAGNOSIS — J986 Disorders of diaphragm: Secondary | ICD-10-CM

## 2015-11-13 MED ORDER — BUDESONIDE-FORMOTEROL FUMARATE 160-4.5 MCG/ACT IN AERO: 2.0000 | INHALATION_SPRAY | Freq: Two times a day (BID) | RESPIRATORY_TRACT | Status: AC

## 2015-11-13 MED ORDER — TIOTROPIUM BROMIDE MONOHYDRATE 18 MCG IN CAPS: 18.0000 ug | ORAL_CAPSULE | Freq: Every day | RESPIRATORY_TRACT | Status: AC

## 2015-11-13 NOTE — Patient Instructions (Signed)
Follow up in 6 months 

## 2015-11-13 NOTE — Progress Notes (Signed)
Current Outpatient Prescriptions on File Prior to Visit  Medication Sig  . albuterol (VENTOLIN HFA) 108 (90 BASE) MCG/ACT inhaler Inhale 2 puffs into the lungs every 6 (six) hours as needed for wheezing or shortness of breath.  Marland Kitchen amLODipine (NORVASC) 5 MG tablet Take 0.5 tablets (2.5 mg total) by mouth daily.  . benzonatate (TESSALON) 200 MG capsule Take 200 mg by mouth 3 (three) times daily as needed for cough.  . gabapentin (NEURONTIN) 100 MG capsule Take 100 mg by mouth at bedtime.  Marland Kitchen glimepiride (AMARYL) 2 MG tablet Take 4 mg by mouth daily.   Marland Kitchen HYDROcodone-acetaminophen (VICODIN) 5-500 MG per tablet Take 2 tablets by mouth every 6 (six) hours as needed for pain.   . metFORMIN (GLUCOPHAGE) 500 MG tablet Take 1,000 mg by mouth 2 (two) times daily with a meal.   . metolazone (ZAROXOLYN) 2.5 MG tablet Metolazone 2.5 mg twice a week as directed  . metoprolol succinate (TOPROL-XL) 25 MG 24 hr tablet Take 1/2 tab by mouth two times a day  . nitroGLYCERIN (NITROSTAT) 0.4 MG SL tablet Place 1 tablet (0.4 mg total) under the tongue every 5 (five) minutes as needed for chest pain.  . potassium chloride (K-DUR) 10 MEQ tablet Take 2 tablets (20 mEq total) by mouth 2 (two) times daily.  . silver sulfADIAZINE (SILVADENE) 1 % cream Apply 1 application topically daily.  . simvastatin (ZOCOR) 20 MG tablet Take 20 mg by mouth at bedtime.    . torsemide (DEMADEX) 100 MG tablet Take 1 tablet (100 mg total) by mouth 2 (two) times daily.  Marland Kitchen warfarin (COUMADIN) 3 MG tablet Take by mouth as directed.    No current facility-administered medications on file prior to visit.    Chief Complaint  Patient presents with  . Follow-up    Wears CPAP nightly. Denies problems with mask/pressure. DME: [facility]; SOB is the same- no change. Pt requests print out of [facility] and Symbicort rx's    Tests PFT 2011 >> FEV1 2.04 (66%), FEV1% 65 SNIFF 11/16/12 >> Rt hemidiaphragm paralysis ONO on BiPAP and 2 liters 11/19/12 >> test time  7 hrs 41 min.  Mean SpO2 93%, low SpO2 64%.  Spent 4 min 12 sec < 88% Echo 11/21/12 >>  Mod LVH, EF 60 to 65%, mod LA dilation, mod RV dilation, PAS 55 mmHg BiPAP 02/21/13 to 08/18/13 >> Used on 179 of 179 nights with average 9 hrs 56 min. Average AHI 1.9 with BiPAP 14/10 cm H2O. PFT 09/30/13 >> FEV1 1.78 (51%), FEV1% 73, TLC 4.67 (59%), DLCO 49%, no BD Echo 07/30/15 >> EF 60 to 65%, mild LVH, mild AS, PAS 71 mmHg BiPAP 10/13/15 to 11/11/15 >> used on 30 of 30 nights with average 12 hrs and 13 min.  Average AHI is 0.8 with BiPAP 14/10 cm H2O.   Past medical history HTN, HLD, DM, A fib, CVA 2008, CLL  PSHx, Medications, Allergies, Fhx, Shx reviewed.  Vital signs BP 112/80 mmHg  Pulse 74  Ht 6\' 3"  (1.905 m)  Wt 293 lb (132.904 kg)  BMI 36.62 kg/m2  SpO2 91%   History of Present Illness: William Braun is a 82 y.o. male former smoker with COPD, OSA/OHS, Rt hemidiaphragm paralysis, and chronic respiratory failure.  He was tx for exacerbation in January with Zpak.  CXR from 10/12/15 did not show any acute findings.  He forgot to bring his oxygen with him today >> SpO2 81% on room air at rest.  His breathing  has improved.  He still has cough with clear sputum, but phlegm comes up easier.  He is not having wheeze or chest pain.  Leg swelling is better.   Physical Exam:  General - in wheel chair, wearing oxygen ENT - No sinus tenderness, no oral exudate, no LAN Cardiac - s1s2 regular, no murmur Chest - decreased breath sounds Rt base, no wheeze Back - No focal tenderness Abd - Soft, non-tender Ext - ankle edema Neuro - Normal strength Skin - No rashes Psych - normal mood, and behavior  Discussion:   Dyspnea >> related to COPD, OSA/OHS, morbid obesity, chronic diastolic heart failure, Rt hemidiaphragm paralysis, and deconditioning.  Assessment/Plan:  COPD. Plan: - continue spiriva, and symbicort - continue prn albuterol - flutter valve as needed  Obstructive sleep  apnea. Plan: - continue BiPAP 14/10 cm H2O  Chronic respiratory failure with hypoxia >> related to COPD, OSA/OHS, Rt hemidiaphragm paralysis. Plan: - continue 2 liters oxygen at rest and with BiPAP at night - he needs to use 4 liters oxygen with exertion   Patient Instructions  Follow up in 6 months     Chesley Mires, MD [facility] Pager:  [phone]

## 2015-11-17 DIAGNOSIS — G4733 Obstructive sleep apnea (adult) (pediatric): Secondary | ICD-10-CM | POA: Diagnosis not present

## 2015-11-17 DIAGNOSIS — J9610 Chronic respiratory failure, unspecified whether with hypoxia or hypercapnia: Secondary | ICD-10-CM | POA: Diagnosis not present

## 2015-11-17 DIAGNOSIS — R0602 Shortness of breath: Secondary | ICD-10-CM | POA: Diagnosis not present

## 2015-11-18 DIAGNOSIS — H04121 Dry eye syndrome of right lacrimal gland: Secondary | ICD-10-CM | POA: Diagnosis not present

## 2015-11-18 DIAGNOSIS — E119 Type 2 diabetes mellitus without complications: Secondary | ICD-10-CM | POA: Diagnosis not present

## 2015-11-18 DIAGNOSIS — Z961 Presence of intraocular lens: Secondary | ICD-10-CM | POA: Diagnosis not present

## 2015-11-27 DIAGNOSIS — Z7901 Long term (current) use of anticoagulants: Secondary | ICD-10-CM | POA: Diagnosis not present

## 2015-11-30 ENCOUNTER — Ambulatory Visit (HOSPITAL_COMMUNITY)
Admission: RE | Admit: 2015-11-30 | Discharge: 2015-11-30 | Disposition: A | Discharge status: Home / Self Care | Payer: PPO | Source: Ambulatory Visit | Attending: Cardiology | Admitting: Cardiology

## 2015-11-30 ENCOUNTER — Encounter (HOSPITAL_COMMUNITY): Payer: Self-pay

## 2015-11-30 VITALS — BP 116/70 | HR 77 | Wt 291.0 lb

## 2015-11-30 DIAGNOSIS — I130 Hypertensive heart and chronic kidney disease with heart failure and stage 1 through stage 4 chronic kidney disease, or unspecified chronic kidney disease: Secondary | ICD-10-CM | POA: Insufficient documentation

## 2015-11-30 DIAGNOSIS — I5032 Chronic diastolic (congestive) heart failure: Secondary | ICD-10-CM | POA: Insufficient documentation

## 2015-11-30 DIAGNOSIS — E662 Morbid (severe) obesity with alveolar hypoventilation: Secondary | ICD-10-CM | POA: Diagnosis not present

## 2015-11-30 DIAGNOSIS — Z7901 Long term (current) use of anticoagulants: Secondary | ICD-10-CM | POA: Insufficient documentation

## 2015-11-30 DIAGNOSIS — E785 Hyperlipidemia, unspecified: Secondary | ICD-10-CM | POA: Insufficient documentation

## 2015-11-30 DIAGNOSIS — Z96651 Presence of right artificial knee joint: Secondary | ICD-10-CM | POA: Insufficient documentation

## 2015-11-30 DIAGNOSIS — Z87891 Personal history of nicotine dependence: Secondary | ICD-10-CM | POA: Diagnosis not present

## 2015-11-30 DIAGNOSIS — E1122 Type 2 diabetes mellitus with diabetic chronic kidney disease: Secondary | ICD-10-CM | POA: Diagnosis not present

## 2015-11-30 DIAGNOSIS — C9110 Chronic lymphocytic leukemia of B-cell type not having achieved remission: Secondary | ICD-10-CM | POA: Insufficient documentation

## 2015-11-30 DIAGNOSIS — Z8673 Personal history of transient ischemic attack (TIA), and cerebral infarction without residual deficits: Secondary | ICD-10-CM | POA: Insufficient documentation

## 2015-11-30 DIAGNOSIS — I4891 Unspecified atrial fibrillation: Secondary | ICD-10-CM | POA: Insufficient documentation

## 2015-11-30 DIAGNOSIS — Z79899 Other long term (current) drug therapy: Secondary | ICD-10-CM | POA: Insufficient documentation

## 2015-11-30 DIAGNOSIS — J438 Other emphysema: Secondary | ICD-10-CM | POA: Diagnosis not present

## 2015-11-30 DIAGNOSIS — Z7984 Long term (current) use of oral hypoglycemic drugs: Secondary | ICD-10-CM | POA: Insufficient documentation

## 2015-11-30 DIAGNOSIS — N183 Chronic kidney disease, stage 3 (moderate): Secondary | ICD-10-CM | POA: Insufficient documentation

## 2015-11-30 LAB — BASIC METABOLIC PANEL
ANION GAP: 16 — AB (ref 5–15)
BUN: 64 mg/dL — ABNORMAL HIGH (ref 6–20)
CALCIUM: 10 mg/dL (ref 8.9–10.3)
CO2: 33 mmol/L — ABNORMAL HIGH (ref 22–32)
Chloride: 91 mmol/L — ABNORMAL LOW (ref 101–111)
Creatinine, Ser: 1.69 mg/dL — ABNORMAL HIGH (ref 0.61–1.24)
GFR, EST AFRICAN AMERICAN: 42 mL/min — AB (ref >60)
GFR, EST NON AFRICAN AMERICAN: 37 mL/min — AB (ref >60)
GLUCOSE: 157 mg/dL — AB (ref 65–99)
POTASSIUM: 4.8 mmol/L (ref 3.5–5.1)
SODIUM: 140 mmol/L (ref 135–145)

## 2015-11-30 LAB — BRAIN NATRIURETIC PEPTIDE: B NATRIURETIC PEPTIDE 5: 56.5 pg/mL (ref 0.0–100.0)

## 2015-11-30 NOTE — Progress Notes (Signed)
Advanced Heart Failure Medication Review by a Pharmacist  Does the patient  feel that his/her medications are working for him/her?  yes  Has the patient been experiencing any side effects to the medications prescribed?  Yes.  States that he is having itching but unsure of the cause  Does the patient measure his/her own blood pressure or blood glucose at home?  no   Does the patient have any problems obtaining medications due to transportation or finances?   no  Understanding of regimen: poor Understanding of indications: poor Potential of compliance: poor Patient understands to avoid NSAIDs. Patient understands to avoid decongestants.  Issues to address at subsequent visits: Adherence, correct medication list, needs pharmacy visit   Pharmacist comments: 82 YO pleasant male presents with his wife without a medication list or his medications. Pt was really confused with his medications and had trouble describing how he took the majority of his medications  Pt stated he was taking 60 mg torsemide BID (100 mg BID prescribed) and could not remember how many potassium tablets he was taking but believed that he was taking 20 meq BID. Pt stated he was taking metolazone BID. Pt would be a great candidate for a pharmacy visit to reconcile his medications.     Time with patient: 10 mn  Preparation and documentation time: 5 min  Total time: 15 min

## 2015-11-30 NOTE — Patient Instructions (Addendum)
Routine lab work today. Will notify you of abnormal results  Follow up with Dr.McLean in 3 months v

## 2015-11-30 NOTE — Progress Notes (Signed)
Patient ID: William Braun, male   DOB: Jun 28, 1933, 82 y.o.   MRN: [id number] PCP: Dr. Michail Sermon Pulmonary: Dr Halford Chessman  82 yo with h/o HTN, CVA, diabetes, obesity-hypoventilation syndrome, diastolic CHF, and chronic atrial fibrillation presents for cardiology followup.  He has OHS and uses BIPAP at night and oxygen during the day.  He has atrial fibrillation with reasonable rate control and is on coumadin.   His legs continue to feel weak (normal ABIs in 10/16).    He returns for follow up. Limited mobility. Essentially chair bound, walks short distances.  He is short of breath dressing and with even short distances walking; this is chronic.  No chest pain. +Orthopnea. No melena/BRBPR.  Edema is better on biw metolazone.  He is now taking torsemide 60 mg bid. No lightheadedness.    Labs (2/10): creatinine 1.15, BNP 73, TSH normal  Labs (9/12): K 4.1, creatinine 1.4, BNP 90 Labs (11/12): K 4.2, creatinine 1.9 Labs (2/13): K 4.2, creatinine 1.3, proBNP 49, LDL 39, HDL 35 Labs (4/13): K 4, creatinine 1.5, BNP 48 Labs (3/14): K 4 => 3.9, creatinine 1.4 => 1.6 Labs (4/14): K 4, creatinine 1.5 Labs (5/15): K 4.2, creatinine 1.4, BNP 49 Labs (8/15): K 3.7, creatinine 1.4 Labs (10/15): K 3.6, creatinine 1.29 Labs (8/16): K 4.7, creatinine 1.34, HCT 47.9 Labs (11/16): K 4, creatinine 1.23 => 1.39, BNP 78 Labs (2/17): K 3.5, creatinine 1.85  Allergies (verified):  1) ! Quinine   Past Medical History:  1. Atrial Fibrillation: apparently developed post-op right TKR in 2/10. Pt was started on coumadin. Now chronic.  2. Diabetes Type 2  3. Hyperlipidemia  4. Hypertension  5. Cerebrovascular Disease-CVA-2008  6. Obesity  7. Osteoarthritis left knee, s/p R TKR  8. History of thrombocytopenia  9. Diastolic CHF: Echo ([id number]) with EF 55%, moderately dilated RV with moderate RV systolic dysfunction, PA systolic pressure 36 mmHg.  Echo (3/14) with EF 60-65%, moderate LVH, moderate RV dilation with normal systolic  function, PA systolic pressure [id number] mmHg.  Echo (11/16) with EF 60-65%, mild LVH, mild aortic stenosis, mildly dilated aortic root 4.3 cm, PASP 71 mmHg.  10. Obesity hypoventilation syndrome/OSA: BIPAP at night, O2 during the day with exertion. PFTs (1/15) with FEV1 49%, ratio 97%, TLC 59%, DLCO 49% => mixed obstructive/restrictive.  11. Prolonged hospitalization in fall 2011 with Strep agalactiae bacteremia, MRSA PNA, and septic shock.  12. Lexiscan myoview (11/12) with EF 65%, no ischemia or infarction.  13. 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  14. CKD 15. CLL: Dr. Marin Olp 16. ABIs (7/15) normal.  ABIs (10/16) normal.   Family History:  Father: deceased MVA  Mother: deceased MVA   Social History:  Married  Tobacco Use - Former. 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   Current Outpatient Prescriptions  Medication Sig Dispense Refill  . albuterol (VENTOLIN HFA) 108 (90 BASE) MCG/ACT inhaler Inhale 2 puffs into the lungs every 6 (six) hours as needed for wheezing or shortness of breath. 3 Inhaler 1  . amLODipine (NORVASC) 5 MG tablet Take 0.5 tablets (2.5 mg total) by mouth daily. 15 tablet 3  . benzonatate (TESSALON) 200 MG capsule Take 200 mg by mouth 3 (three) times daily as needed for cough.    . budesonide-formoterol (SYMBICORT) 160-4.5 MCG/ACT inhaler Inhale 2 puffs into the lungs 2 (two) times daily. 2 puffs twice daily 3 Inhaler 3  .  gabapentin (NEURONTIN) 100 MG capsule Take 100 mg by mouth at bedtime.    Marland Kitchen glimepiride (AMARYL) 2 MG tablet Take 4 mg by mouth daily.     Marland Kitchen HYDROcodone-acetaminophen (VICODIN) 5-500 MG per tablet Take 2 tablets by mouth every 6 (six) hours as needed for pain.     . metFORMIN (GLUCOPHAGE) 500 MG tablet Take 1,000 mg by mouth 2 (two) times daily with a meal.     . metolazone (ZAROXOLYN) 2.5 MG tablet Metolazone 2.5 mg twice a week as directed 60 tablet 3  .  metoprolol succinate (TOPROL-XL) 25 MG 24 hr tablet Take 1/2 tab by mouth two times a day    . nitroGLYCERIN (NITROSTAT) 0.4 MG SL tablet Place 1 tablet (0.4 mg total) under the tongue every 5 (five) minutes as needed for chest pain. 25 tablet 3  . potassium chloride (K-DUR) 10 MEQ tablet Take 2 tablets (20 mEq total) by mouth 2 (two) times daily. 120 tablet 3  . silver sulfADIAZINE (SILVADENE) 1 % cream Apply 1 application topically daily. 50 g 0  . simvastatin (ZOCOR) 20 MG tablet Take 20 mg by mouth at bedtime.      Marland Kitchen tiotropium (SPIRIVA) 18 MCG inhalation capsule Place 1 capsule (18 mcg total) into inhaler and inhale daily. 90 capsule 3  . torsemide (DEMADEX) 20 MG tablet Take 60 mg by mouth 2 (two) times daily.    Marland Kitchen warfarin (COUMADIN) 3 MG tablet Take by mouth as directed. 2 tabs sat sun, 1.5 tabs wed, all other days 1 tablet .     No current facility-administered medications for this encounter.    BP 116/70 mmHg  Pulse 77  Wt 291 lb (131.997 kg)  SpO2 97% General: NAD, obese. Arrived in a scooter.  Neck: Thick, JVP 7 cm (difficult), no thyromegaly or thyroid nodule.  Lungs: Crackles at bases bilaterally.   CV: Nondisplaced PMI.  Heart irregular S1/S2, no S3/S4, 2/6 SEM RUSB. 1+ ankle edema bilaterally.  No carotid bruit.  Unable to feel pedal pulses in setting of edema. Abdomen: Soft, nontender, no hepatosplenomegaly, no distention.  Neurologic: Alert and oriented x 3.  Psych: Normal affect. Extremities: No clubbing or cyanosis.   Assessment/Plan: 1. Chronic diastolic CHF: Probably with a component of pulmonary arterial HTN from OHS/OSA with right heart failure.  He has NYHA class IIIb symptoms, stable.  Volume status looks better on metolazone, less leg edema.  - Continue torsemide 60 mg bid.     - Continue metolazone 2.5 mg twice a week on Tuesday and Friday, take extra KCl 40 on metolazone days.  - BMET/BNP today.   - I will have him continue to wear graded compression  stockings.  2. OHS/OSA: Continue nocturnal Bipap and oxygen during the day.   3. Atrial fibrillation: Reasonable rate control on Toprol XL.  Continue coumadin. INR per PCP.  4. HTN: BP stable. 5. CKD: Stage III.  Check BMET today.   Followup in 3 mos.    [name] 11/30/2015

## 2015-12-04 ENCOUNTER — Ambulatory Visit: Payer: PPO | Admitting: Podiatry

## 2015-12-07 DIAGNOSIS — L299 Pruritus, unspecified: Secondary | ICD-10-CM | POA: Diagnosis not present

## 2015-12-07 DIAGNOSIS — I8312 Varicose veins of left lower extremity with inflammation: Secondary | ICD-10-CM | POA: Diagnosis not present

## 2015-12-07 DIAGNOSIS — I8311 Varicose veins of right lower extremity with inflammation: Secondary | ICD-10-CM | POA: Diagnosis not present

## 2015-12-10 DIAGNOSIS — I509 Heart failure, unspecified: Secondary | ICD-10-CM | POA: Diagnosis not present

## 2015-12-10 DIAGNOSIS — E662 Morbid (severe) obesity with alveolar hypoventilation: Secondary | ICD-10-CM | POA: Diagnosis not present

## 2015-12-10 DIAGNOSIS — J449 Chronic obstructive pulmonary disease, unspecified: Secondary | ICD-10-CM | POA: Diagnosis not present

## 2015-12-10 DIAGNOSIS — R0602 Shortness of breath: Secondary | ICD-10-CM | POA: Diagnosis not present

## 2015-12-10 DIAGNOSIS — J438 Other emphysema: Secondary | ICD-10-CM | POA: Diagnosis not present

## 2015-12-10 DIAGNOSIS — J9610 Chronic respiratory failure, unspecified whether with hypoxia or hypercapnia: Secondary | ICD-10-CM | POA: Diagnosis not present

## 2015-12-17 DIAGNOSIS — J9610 Chronic respiratory failure, unspecified whether with hypoxia or hypercapnia: Secondary | ICD-10-CM | POA: Diagnosis not present

## 2015-12-17 DIAGNOSIS — R0602 Shortness of breath: Secondary | ICD-10-CM | POA: Diagnosis not present

## 2015-12-17 DIAGNOSIS — G4733 Obstructive sleep apnea (adult) (pediatric): Secondary | ICD-10-CM | POA: Diagnosis not present

## 2015-12-21 ENCOUNTER — Encounter: Payer: Self-pay | Admitting: Podiatry

## 2015-12-21 ENCOUNTER — Telehealth: Payer: Self-pay | Admitting: *Deleted

## 2015-12-21 ENCOUNTER — Ambulatory Visit (INDEPENDENT_AMBULATORY_CARE_PROVIDER_SITE_OTHER): Payer: PPO | Admitting: Podiatry

## 2015-12-21 VITALS — BP 111/82 | HR 80 | Resp 18

## 2015-12-21 DIAGNOSIS — L97511 Non-pressure chronic ulcer of other part of right foot limited to breakdown of skin: Secondary | ICD-10-CM

## 2015-12-21 DIAGNOSIS — E1149 Type 2 diabetes mellitus with other diabetic neurological complication: Secondary | ICD-10-CM

## 2015-12-21 DIAGNOSIS — L89891 Pressure ulcer of other site, stage 1: Secondary | ICD-10-CM | POA: Diagnosis not present

## 2015-12-21 MED ORDER — AMOXICILLIN-POT CLAVULANATE 875-125 MG PO TABS: 1.0000 | ORAL_TABLET | Freq: Two times a day (BID) | ORAL | Status: DC

## 2015-12-21 NOTE — Telephone Encounter (Signed)
Dr. Jacqualyn Posey ordered Collagen with silver for daily dressing changes to right plantar hallux diabetic ulcer, 0.5cm x 0.5cm x 0.2cm with low exudate.  Faxed.

## 2015-12-22 ENCOUNTER — Encounter: Payer: Self-pay | Admitting: Podiatry

## 2015-12-22 DIAGNOSIS — L97509 Non-pressure chronic ulcer of other part of unspecified foot with unspecified severity: Secondary | ICD-10-CM | POA: Diagnosis not present

## 2015-12-22 NOTE — Progress Notes (Signed)
Patient ID: William Braun, male   DOB: [birth year], 82 y.o.   MRN: [id number]  Subjective: 82 year old male presents the office a follow-up evaluation of a wound to the bottom of his right hallux. He states that he has noticed some small, thin the wound. His been draining some blood but denies any pus. Denies any redness or warmth or swelling to the foot. His wife has continue with Silvadene dressing changes daily. He denies any systemic complaints such as fevers, chills, nausea, vomiting. No calf pain, chest pain, shortness of breath. No other complaints at this time.  Objective: AAO 3, NAD, presents in wheelchair DP/PT pulses 1/4, CRT less than 3 seconds Protective sensation decreased with Simms Weinstein monofilament On the plantar aspect of the right hallux with an annular type ulceration measuring 0.8 x 0.5 x 0.2. There is hyperkeratotic periwound prior to debridement. There is no probing, undermining or tunneling. There is no surrounding erythema, ascending cellulitis. No edema. No purulence or drainage. There is mild malodor to the wound. No other open lesions or pre-ulcer lesions identified today.  There is no pain with calf compression, swelling, warmth, erythema.  Assessment: 82 year old male with ulceration right hallux   Plan: -Treatment options discussed including all alternatives, risks, and complications -Wound sharply debrided without complications to healthy, granular wound base. Prisma was applied. Ordered dressings for him today. -Offloading to the wound. -Due to the malodor will start antibiotics. Prescribed Augmentin. -Monitor for any clinical signs or symptoms of infection and directed to call the office immediately should any occur or go to the ER. -Follow-up as scheduled or sooner if any problems arise. In the meantime, encouraged to call the office with any questions, concerns, change in symptoms.   ABI 06/2615: Technically challenging study secondary to respirations,  and inability to recline. No evidence of segmental lower extremity arterial disease at rest, bilaterally. Normal ABI's, bilaterally. Bilateral great toe pressures are normal.  Celesta Gentile, DPM

## 2015-12-25 DIAGNOSIS — Z7901 Long term (current) use of anticoagulants: Secondary | ICD-10-CM | POA: Diagnosis not present

## 2016-01-04 ENCOUNTER — Ambulatory Visit: Payer: PPO | Admitting: Podiatry

## 2016-01-04 DIAGNOSIS — I8310 Varicose veins of unspecified lower extremity with inflammation: Secondary | ICD-10-CM | POA: Diagnosis not present

## 2016-01-06 DIAGNOSIS — E1122 Type 2 diabetes mellitus with diabetic chronic kidney disease: Secondary | ICD-10-CM | POA: Diagnosis not present

## 2016-01-06 DIAGNOSIS — I482 Chronic atrial fibrillation: Secondary | ICD-10-CM | POA: Diagnosis not present

## 2016-01-06 DIAGNOSIS — I519 Heart disease, unspecified: Secondary | ICD-10-CM | POA: Diagnosis not present

## 2016-01-06 DIAGNOSIS — Z7984 Long term (current) use of oral hypoglycemic drugs: Secondary | ICD-10-CM | POA: Diagnosis not present

## 2016-01-06 DIAGNOSIS — R5383 Other fatigue: Secondary | ICD-10-CM | POA: Diagnosis not present

## 2016-01-06 DIAGNOSIS — N183 Chronic kidney disease, stage 3 (moderate): Secondary | ICD-10-CM | POA: Diagnosis not present

## 2016-01-08 ENCOUNTER — Encounter: Payer: Self-pay | Admitting: Podiatry

## 2016-01-08 ENCOUNTER — Ambulatory Visit (INDEPENDENT_AMBULATORY_CARE_PROVIDER_SITE_OTHER): Payer: PPO | Admitting: Podiatry

## 2016-01-08 VITALS — BP 112/76 | HR 87 | Resp 12

## 2016-01-08 DIAGNOSIS — L97511 Non-pressure chronic ulcer of other part of right foot limited to breakdown of skin: Secondary | ICD-10-CM | POA: Diagnosis not present

## 2016-01-08 DIAGNOSIS — E1149 Type 2 diabetes mellitus with other diabetic neurological complication: Secondary | ICD-10-CM

## 2016-01-10 ENCOUNTER — Encounter: Payer: Self-pay | Admitting: Podiatry

## 2016-01-10 DIAGNOSIS — J449 Chronic obstructive pulmonary disease, unspecified: Secondary | ICD-10-CM | POA: Diagnosis not present

## 2016-01-10 DIAGNOSIS — J438 Other emphysema: Secondary | ICD-10-CM | POA: Diagnosis not present

## 2016-01-10 DIAGNOSIS — I509 Heart failure, unspecified: Secondary | ICD-10-CM | POA: Diagnosis not present

## 2016-01-10 DIAGNOSIS — J9610 Chronic respiratory failure, unspecified whether with hypoxia or hypercapnia: Secondary | ICD-10-CM | POA: Diagnosis not present

## 2016-01-10 DIAGNOSIS — R0602 Shortness of breath: Secondary | ICD-10-CM | POA: Diagnosis not present

## 2016-01-10 DIAGNOSIS — E662 Morbid (severe) obesity with alveolar hypoventilation: Secondary | ICD-10-CM | POA: Diagnosis not present

## 2016-01-10 NOTE — Progress Notes (Signed)
Patient ID: William Braun, male   DOB: 1933-01-26, 82 y.o.   MRN: [id number]  Subjective: 82 year old male presents the office a follow-up evaluation of a wound to the bottom of his right hallux. He states he still gets some bloody drainage from the wound but denies any pus. Denies any redness or warmth. No swelling. He denies any systemic complaints such as fevers, chills, nausea, vomiting. No calf pain, chest pain, shortness of breath. No other complaints at this time.  Objective: AAO 3, NAD, presents in wheelchair DP/PT pulses 1/4, CRT less than 3 seconds Protective sensation decreased with Simms Weinstein monofilament On the plantar aspect of the right hallux with an annular type ulceration measuring 0.7 x 0.3 x 0.2. There is hyperkeratotic periwound prior to debridement. There is no probing, undermining or tunneling. There is no surrounding erythema, ascending cellulitis. No edema. No purulence or drainage. There is mild malodor to the wound. No other open lesions or pre-ulcer lesions identified today.  There is no pain with calf compression, swelling, warmth, erythema.  Assessment: 82 year old male with ulceration right hallux   Plan: -Treatment options discussed including all alternatives, risks, and complications -Wound sharply debrided without complications to healthy, granular wound base. Prisma was applied. Continue this at home daily.  -Offloading to the wound. -Monitor for any clinical signs or symptoms of infection and directed to call the office immediately should any occur or go to the ER. -Follow-up as scheduled or sooner if any problems arise. In the meantime, encouraged to call the office with any questions, concerns, change in symptoms.   ABI 06/2615: Technically challenging study secondary to respirations, and inability to recline. No evidence of segmental lower extremity arterial disease at rest, bilaterally. Normal ABI's, bilaterally. Bilateral great toe pressures are  normal.  William Braun, DPM

## 2016-01-20 ENCOUNTER — Ambulatory Visit (INDEPENDENT_AMBULATORY_CARE_PROVIDER_SITE_OTHER): Payer: PPO | Admitting: Pulmonary Disease

## 2016-01-20 ENCOUNTER — Encounter: Payer: Self-pay | Admitting: Pulmonary Disease

## 2016-01-20 VITALS — BP 102/60 | HR 79 | Ht 74.0 in | Wt 288.0 lb

## 2016-01-20 DIAGNOSIS — E662 Morbid (severe) obesity with alveolar hypoventilation: Secondary | ICD-10-CM | POA: Diagnosis not present

## 2016-01-20 DIAGNOSIS — J301 Allergic rhinitis due to pollen: Secondary | ICD-10-CM

## 2016-01-20 DIAGNOSIS — J432 Centrilobular emphysema: Secondary | ICD-10-CM | POA: Diagnosis not present

## 2016-01-20 DIAGNOSIS — G4733 Obstructive sleep apnea (adult) (pediatric): Secondary | ICD-10-CM | POA: Diagnosis not present

## 2016-01-20 DIAGNOSIS — J986 Disorders of diaphragm: Secondary | ICD-10-CM | POA: Diagnosis not present

## 2016-01-20 DIAGNOSIS — J9611 Chronic respiratory failure with hypoxia: Secondary | ICD-10-CM

## 2016-01-20 NOTE — Progress Notes (Signed)
Current Outpatient Prescriptions on File Prior to Visit  Medication Sig  . albuterol (VENTOLIN HFA) 108 (90 BASE) MCG/ACT inhaler Inhale 2 puffs into the lungs every 6 (six) hours as needed for wheezing or shortness of breath.  Marland Kitchen amLODipine (NORVASC) 5 MG tablet Take 0.5 tablets (2.5 mg total) by mouth daily.  . budesonide-formoterol (SYMBICORT) 160-4.5 MCG/ACT inhaler Inhale 2 puffs into the lungs 2 (two) times daily. 2 puffs twice daily  . glimepiride (AMARYL) 2 MG tablet Take 4 mg by mouth daily.   Marland Kitchen HYDROcodone-acetaminophen (VICODIN) 5-500 MG per tablet Take 2 tablets by mouth every 6 (six) hours as needed for pain.   . metFORMIN (GLUCOPHAGE) 500 MG tablet Take 1,000 mg by mouth 2 (two) times daily with a meal.   . metolazone (ZAROXOLYN) 2.5 MG tablet Metolazone 2.5 mg twice a week as directed  . metoprolol succinate (TOPROL-XL) 25 MG 24 hr tablet Take 1/2 tab by mouth two times a day  . nitroGLYCERIN (NITROSTAT) 0.4 MG SL tablet Place 1 tablet (0.4 mg total) under the tongue every 5 (five) minutes as needed for chest pain.  . potassium chloride (K-DUR) 10 MEQ tablet Take 2 tablets (20 mEq total) by mouth 2 (two) times daily.  . silver sulfADIAZINE (SILVADENE) 1 % cream Apply 1 application topically daily.  . simvastatin (ZOCOR) 20 MG tablet Take 20 mg by mouth at bedtime.    Marland Kitchen tiotropium (SPIRIVA) 18 MCG inhalation capsule Place 1 capsule (18 mcg total) into inhaler and inhale daily.  Marland Kitchen torsemide (DEMADEX) 20 MG tablet Take 60 mg by mouth 2 (two) times daily.  Marland Kitchen warfarin (COUMADIN) 3 MG tablet Take by mouth as directed. 2 tabs sat sun, 1.5 tabs wed, all other days 1 tablet .   No current facility-administered medications on file prior to visit.    Chief Complaint  Patient presents with  . Follow-up    Pt c/o SOB, cough, wheezing and little mucus production with runny nose. Pt feels d/t pollen. Wears CPAP nightly with 3 Liters O2. Patient uses 3 Liters O2 pulsed during the day. Denies  porblems with mask/pressure. DME: [facility].     Tests PFT 2011 >> FEV1 2.04 (66%), FEV1% 65 SNIFF 11/16/12 >> Rt hemidiaphragm paralysis ONO on BiPAP and 2 liters 11/19/12 >> test time 7 hrs 41 min.  Mean SpO2 93%, low SpO2 64%.  Spent 4 min 12 sec < 88% Echo 11/21/12 >>  Mod LVH, EF 60 to 65%, mod LA dilation, mod RV dilation, PAS 55 mmHg BiPAP 02/21/13 to 08/18/13 >> Used on 179 of 179 nights with average 9 hrs 56 min. Average AHI 1.9 with BiPAP 14/10 cm H2O. PFT 09/30/13 >> FEV1 1.78 (51%), FEV1% 73, TLC 4.67 (59%), DLCO 49%, no BD Echo 07/30/15 >> EF 60 to 65%, mild LVH, mild AS, PAS 71 mmHg BiPAP 12/20/15 to 01/18/16 >> used on 30 of 30 nights with average 11 hrs 40 min.  Average AHI 0.4 with BiPAP 14/10 cm H2O   Past medical history HTN, HLD, DM, A fib, CVA 2008, CLL  PSHx, Medications, Allergies, Fhx, Shx reviewed.  Vital signs BP 102/60 mmHg  Pulse 79  Ht 6\' 2"  (1.88 m)  Wt 288 lb (130.636 kg)  BMI 36.96 kg/m2  SpO2 91%   History of Present Illness: William Braun is a 82 y.o. male former smoker with COPD, OSA/OHS, Rt hemidiaphragm paralysis, and chronic respiratory failure.  He has noticed more cough and chest congestion with allergies.  He  has runny nose also, but his happens when he uses his oxygen.  He doesn't always bring up sputum, but when he does it is clear.  He is not having wheeze, chest pain, or fever.  He notices more trouble with his breathing when he goes out in the wind.  Physical Exam:  General - in wheel chair, wearing oxygen ENT - No sinus tenderness, no oral exudate, no LAN Cardiac - s1s2 regular, no murmur Chest - decreased breath sounds Rt base, no wheeze Back - No focal tenderness Abd - Soft, non-tender Ext - ankle edema Neuro - Normal strength Skin - No rashes Psych - normal mood, and behavior  Discussion:   Dyspnea >> related to COPD, OSA/OHS, morbid obesity, chronic diastolic heart failure, Rt hemidiaphragm paralysis, and deconditioning.  His  current symptoms are most likely related to allergies.  Assessment/Plan:  Allergic rhinitis. - he can try OTC anti-histamine - he is to call if he wants cough medicine  COPD. - continue spiriva, and symbicort - continue prn albuterol - flutter valve as needed  Obstructive sleep apnea. - continue BiPAP 14/10 cm H2O  Chronic respiratory failure with hypoxia >> related to COPD, OSA/OHS, Rt hemidiaphragm paralysis. - continue 2 liters oxygen at rest and with BiPAP at night - he needs to use 4 liters oxygen with exertion   Patient Instructions  You can try using two puffs of albuterol before going outside as needed  Can try one of the following allergy medicines: Claritin, Allegra, Zyrtec, Xyxal  Follow up in 6 months     Chesley Mires, MD [facility] Pager:  [phone] 01/20/2016, 3:06 PM

## 2016-01-20 NOTE — Patient Instructions (Signed)
You can try using two puffs of albuterol before going outside as needed  Can try one of the following allergy medicines: Claritin, Allegra, Zyrtec, Xyxal  Follow up in 6 months

## 2016-01-22 DIAGNOSIS — Z7901 Long term (current) use of anticoagulants: Secondary | ICD-10-CM | POA: Diagnosis not present

## 2016-01-29 ENCOUNTER — Ambulatory Visit (INDEPENDENT_AMBULATORY_CARE_PROVIDER_SITE_OTHER): Payer: PPO | Admitting: Podiatry

## 2016-01-29 ENCOUNTER — Encounter: Payer: Self-pay | Admitting: Podiatry

## 2016-01-29 VITALS — BP 107/76 | HR 92 | Resp 18

## 2016-01-29 DIAGNOSIS — E1149 Type 2 diabetes mellitus with other diabetic neurological complication: Secondary | ICD-10-CM

## 2016-01-29 DIAGNOSIS — L89891 Pressure ulcer of other site, stage 1: Secondary | ICD-10-CM | POA: Diagnosis not present

## 2016-01-29 DIAGNOSIS — L97511 Non-pressure chronic ulcer of other part of right foot limited to breakdown of skin: Secondary | ICD-10-CM

## 2016-02-02 ENCOUNTER — Encounter: Payer: Self-pay | Admitting: Podiatry

## 2016-02-02 DIAGNOSIS — E1149 Type 2 diabetes mellitus with other diabetic neurological complication: Secondary | ICD-10-CM | POA: Insufficient documentation

## 2016-02-02 DIAGNOSIS — L97519 Non-pressure chronic ulcer of other part of right foot with unspecified severity: Secondary | ICD-10-CM | POA: Insufficient documentation

## 2016-02-02 NOTE — Progress Notes (Signed)
Patient ID: DEMORRIO THORNHILL, male   DOB: 09/08/1933, 82 y.o.   MRN: [id number]  Subjective: 82 year old male presents the office a follow-up evaluation of a wound to the bottom of his right hallux. His wife states that overall she feels is getting much better. Denies any drainage or pus. No swelling to his foot. No redness. She's continue with daily dressing changes with collagen packing. He denies any systemic complaints such as fevers, chills, nausea, vomiting. No calf pain, chest pain, shortness of breath. No other complaints at this time.  Objective: AAO 3, NAD, presents in wheelchair DP/PT pulses 1/4, CRT less than 3 seconds Protective sensation decreased with Simms Weinstein monofilament On the plantar aspect of the right hallux with an annular type ulceration measuring 0.5 x 0.3 x 0.2. There is hyperkeratotic periwound prior to debridement. There is no probing, undermining or tunneling. There is no surrounding erythema, ascending cellulitis. No edema. No purulence or drainage. There is mild malodor to the wound. No other open lesions or pre-ulcer lesions identified today.  There is no pain with calf compression, swelling, warmth, erythema.  Assessment: 82 year old male with ulceration right hallux with slow improvement   Plan: -Treatment options discussed including all alternatives, risks, and complications -Wound sharply debrided without complications to healthy, granular wound base. Prisma was applied. Continue this at home daily. Offloading.  -Offloading to the wound. -Monitor for any clinical signs or symptoms of infection and directed to call the office immediately should any occur or go to the ER. -Follow-up as scheduled or sooner if any problems arise. In the meantime, encouraged to call the office with any questions, concerns, change in symptoms.   ABI 06/2615: Technically challenging study secondary to respirations, and inability to recline. No evidence of segmental lower  extremity arterial disease at rest, bilaterally. Normal ABI's, bilaterally. Bilateral great toe pressures are normal.  Celesta Gentile, DPM

## 2016-02-09 DIAGNOSIS — R0602 Shortness of breath: Secondary | ICD-10-CM | POA: Diagnosis not present

## 2016-02-09 DIAGNOSIS — J449 Chronic obstructive pulmonary disease, unspecified: Secondary | ICD-10-CM | POA: Diagnosis not present

## 2016-02-09 DIAGNOSIS — J9610 Chronic respiratory failure, unspecified whether with hypoxia or hypercapnia: Secondary | ICD-10-CM | POA: Diagnosis not present

## 2016-02-09 DIAGNOSIS — J438 Other emphysema: Secondary | ICD-10-CM | POA: Diagnosis not present

## 2016-02-09 DIAGNOSIS — I509 Heart failure, unspecified: Secondary | ICD-10-CM | POA: Diagnosis not present

## 2016-02-09 DIAGNOSIS — E662 Morbid (severe) obesity with alveolar hypoventilation: Secondary | ICD-10-CM | POA: Diagnosis not present

## 2016-02-11 ENCOUNTER — Telehealth: Payer: Self-pay | Admitting: Pulmonary Disease

## 2016-02-11 NOTE — Telephone Encounter (Signed)
LMOMTCB x 1 

## 2016-02-12 MED ORDER — PREDNISONE 10 MG PO TABS: ORAL_TABLET | ORAL | Status: DC

## 2016-02-12 NOTE — Telephone Encounter (Signed)
Spoke with pt.  He is having more dyspnea and feels like he can't get air in.  Denies chest pain, fever, edema.  He has used albuterol and this helps.  Will send script for prednisone.  Defer Abx.  Continue spiriva, symbicort.  He is on coumadin, demadex already.

## 2016-02-12 NOTE — Telephone Encounter (Signed)
Spoke with pt's wife. States that pt has been having issues with SOB. This is worse with exertion. Denies chest tightness, wheezing or coughing. Onset started 4-5 days ago. Is currently using 3L/min of oxygen. Would like recommendations.  VS - please advise. Thanks.

## 2016-02-17 DIAGNOSIS — J9610 Chronic respiratory failure, unspecified whether with hypoxia or hypercapnia: Secondary | ICD-10-CM | POA: Diagnosis not present

## 2016-02-17 DIAGNOSIS — R0602 Shortness of breath: Secondary | ICD-10-CM | POA: Diagnosis not present

## 2016-02-17 DIAGNOSIS — G4733 Obstructive sleep apnea (adult) (pediatric): Secondary | ICD-10-CM | POA: Diagnosis not present

## 2016-02-19 DIAGNOSIS — Z7901 Long term (current) use of anticoagulants: Secondary | ICD-10-CM | POA: Diagnosis not present

## 2016-02-25 ENCOUNTER — Other Ambulatory Visit (HOSPITAL_COMMUNITY): Payer: Self-pay | Admitting: *Deleted

## 2016-02-25 MED ORDER — TORSEMIDE 20 MG PO TABS: 60.0000 mg | ORAL_TABLET | Freq: Two times a day (BID) | ORAL | Status: DC

## 2016-02-26 ENCOUNTER — Ambulatory Visit: Payer: PPO | Admitting: Podiatry

## 2016-03-07 ENCOUNTER — Inpatient Hospital Stay (HOSPITAL_COMMUNITY): Admission: RE | Admit: 2016-03-07 | Payer: PPO | Source: Ambulatory Visit

## 2016-03-11 ENCOUNTER — Ambulatory Visit: Payer: PPO | Admitting: Podiatry

## 2016-03-11 DIAGNOSIS — I509 Heart failure, unspecified: Secondary | ICD-10-CM | POA: Diagnosis not present

## 2016-03-11 DIAGNOSIS — J438 Other emphysema: Secondary | ICD-10-CM | POA: Diagnosis not present

## 2016-03-11 DIAGNOSIS — E662 Morbid (severe) obesity with alveolar hypoventilation: Secondary | ICD-10-CM | POA: Diagnosis not present

## 2016-03-11 DIAGNOSIS — J9610 Chronic respiratory failure, unspecified whether with hypoxia or hypercapnia: Secondary | ICD-10-CM | POA: Diagnosis not present

## 2016-03-11 DIAGNOSIS — J449 Chronic obstructive pulmonary disease, unspecified: Secondary | ICD-10-CM | POA: Diagnosis not present

## 2016-03-11 DIAGNOSIS — R0602 Shortness of breath: Secondary | ICD-10-CM | POA: Diagnosis not present

## 2016-03-14 ENCOUNTER — Telehealth (HOSPITAL_COMMUNITY): Payer: Self-pay | Admitting: Vascular Surgery

## 2016-03-14 NOTE — Telephone Encounter (Signed)
Returned pt wife call to reschedule pt missed appt

## 2016-03-18 DIAGNOSIS — R0602 Shortness of breath: Secondary | ICD-10-CM | POA: Diagnosis not present

## 2016-03-18 DIAGNOSIS — J9610 Chronic respiratory failure, unspecified whether with hypoxia or hypercapnia: Secondary | ICD-10-CM | POA: Diagnosis not present

## 2016-03-18 DIAGNOSIS — G4733 Obstructive sleep apnea (adult) (pediatric): Secondary | ICD-10-CM | POA: Diagnosis not present

## 2016-03-30 ENCOUNTER — Other Ambulatory Visit: Payer: Self-pay

## 2016-03-30 NOTE — Patient Outreach (Signed)
[name]) Care Management  03/30/2016  William Braun Feb 16, 1933 [id number]  REFERRAL DATE: 03/28/16 REFERRAL SOURCE: primary MD referral REFERRAL REASON: CPAP rental help  Telephone call to patient regarding primary MD referral.  Unable to reach patient. HIPAA compliant voice message left with call back phone number.   PLAN;  RNCM will attempt 2nd telephone call to patient within  1 week.   Quinn Plowman RN,BSN,CCM [facility] Telephonic  [phone]

## 2016-03-31 ENCOUNTER — Encounter: Payer: Self-pay | Admitting: Acute Care

## 2016-03-31 ENCOUNTER — Ambulatory Visit (INDEPENDENT_AMBULATORY_CARE_PROVIDER_SITE_OTHER): Payer: PPO | Admitting: Acute Care

## 2016-03-31 ENCOUNTER — Other Ambulatory Visit: Payer: Self-pay

## 2016-03-31 ENCOUNTER — Ambulatory Visit (INDEPENDENT_AMBULATORY_CARE_PROVIDER_SITE_OTHER)
Admission: RE | Admit: 2016-03-31 | Discharge: 2016-03-31 | Disposition: A | Discharge status: Home / Self Care | Payer: PPO | Source: Ambulatory Visit | Attending: Acute Care | Admitting: Acute Care

## 2016-03-31 ENCOUNTER — Telehealth: Payer: Self-pay | Admitting: Acute Care

## 2016-03-31 VITALS — BP 102/78 | HR 77 | Ht 74.0 in | Wt 289.0 lb

## 2016-03-31 DIAGNOSIS — J9611 Chronic respiratory failure with hypoxia: Secondary | ICD-10-CM

## 2016-03-31 DIAGNOSIS — J439 Emphysema, unspecified: Secondary | ICD-10-CM

## 2016-03-31 DIAGNOSIS — R0602 Shortness of breath: Secondary | ICD-10-CM | POA: Diagnosis not present

## 2016-03-31 MED ORDER — IPRATROPIUM-ALBUTEROL 0.5-2.5 (3) MG/3ML IN SOLN: 3.0000 mL | Freq: Four times a day (QID) | RESPIRATORY_TRACT | Status: DC | PRN

## 2016-03-31 MED ORDER — AMOXICILLIN-POT CLAVULANATE 875-125 MG PO TABS: 1.0000 | ORAL_TABLET | Freq: Two times a day (BID) | ORAL | Status: DC

## 2016-03-31 MED ORDER — LEVALBUTEROL HCL 0.63 MG/3ML IN NEBU
0.6300 mg | INHALATION_SOLUTION | Freq: Once | RESPIRATORY_TRACT | Status: AC
  Administered 2016-03-31: 0.63 mg via RESPIRATORY_TRACT

## 2016-03-31 NOTE — Progress Notes (Signed)
History of Present Illness William Braun is a 82 y.o. male with 82 yo male COPD, OSA/OHS, Rt hemidiaphragm paralysis, and chronic respiratory failure followed by [name]..Other significant history includes CHF, HTN, DM II, and CLL.    03/31/2016 Acute OV: Pt. Presents to the office today with worsening dyspnea over the last 2 weeks. He states he cannot sleep. He has green to grey secretions, but he states this is his baseline. He states he has very little cough or wheezing.He is using his rescue inhaler about 3 times daily. Saturations in the office today on his 3 L oxygen were 90%. He has noted that he has worsening edema in his feet and legs.He states that the least bit of exertion wares him out. He is compliant with his VPAP at night and sometimes during the day. He wears his oxygen at 3 L [place] 24/7. He denies fever, chest pain, cough or wheezing. Secretions have not increased in amount or character.CXR today ( resulted after patient left the office) indicates enlarged cardiac silhouette, with questionable pulmonary edema, with right basilar atelectasis and tiny left pleural effusions. BNP , BMET,CBC with Diff are pending.  Tests CXR 03/31/2016:  Enlargement of cardiac silhouette. Mediastinal contours and pulmonary vascularity normal. Chronic elevation of RIGHT diaphragm. Perihilar infiltrates bilaterally extending to the lower lungs question pulmonary edema, little changed Mild RIGHT basilar atelectasis and tiny LEFT pleural effusions. No pneumothorax.  IMPRESSION: Probable CHF with RIGHT basilar atelectasis and small LEFT pleural effusion.   PFT 2011 >> FEV1 2.04 (66%), FEV1% 65 SNIFF 11/16/12 >> Rt hemidiaphragm paralysis ONO on BiPAP and 2 liters 11/19/12 >> test time 7 hrs 41 min. Mean SpO2 93%, low SpO2 64%. Spent 4 min 12 sec < 88% Echo 11/21/12 >> Mod LVH, EF 60 to 65%, mod LA dilation, mod RV dilation, PAS 55 mmHg BiPAP 02/21/13 to 08/18/13 >> Used on 179 of 179 nights with  average 9 hrs 56 min. Average AHI 1.9 with BiPAP 14/10 cm H2O. PFT 09/30/13 >> FEV1 1.78 (51%), FEV1% 73, TLC 4.67 (59%), DLCO 49%, no BD Echo 07/30/15 >> EF 60 to 65%, mild LVH, mild AS, PAS 71 mmHg BiPAP 12/20/15 to 01/18/16 >> used on 30 of 30 nights with average 11 hrs 40 min. Average AHI 0.4 with BiPAP 14/10 cm H2O  Past medical hx Past Medical History  Diagnosis Date  . Other and unspecified hyperlipidemia   . Unspecified essential hypertension   . Type II or unspecified type diabetes mellitus without mention of complication, not stated as uncontrolled   . Osteoarthritis   . Obesity   . History of thrombocytopenia   . Cerebrovascular disease   . Atrial fibrillation ([place])   . CLL (chronic lymphocytic leukemia) ([place]) 11/17/2014     Past surgical hx, Family hx, Social hx all reviewed.  Current Outpatient Prescriptions on File Prior to Visit  Medication Sig  . albuterol (VENTOLIN HFA) 108 (90 BASE) MCG/ACT inhaler Inhale 2 puffs into the lungs every 6 (six) hours as needed for wheezing or shortness of breath.  Marland Kitchen amLODipine (NORVASC) 5 MG tablet Take 0.5 tablets (2.5 mg total) by mouth daily.  . budesonide-formoterol (SYMBICORT) 160-4.5 MCG/ACT inhaler Inhale 2 puffs into the lungs 2 (two) times daily. 2 puffs twice daily  . glimepiride (AMARYL) 2 MG tablet Take 4 mg by mouth daily.   Marland Kitchen HYDROcodone-acetaminophen (VICODIN) 5-500 MG per tablet Take 2 tablets by mouth every 6 (six) hours as needed for pain.   Marland Kitchen  metFORMIN (GLUCOPHAGE) 500 MG tablet Take 1,000 mg by mouth 2 (two) times daily with a meal.   . metolazone (ZAROXOLYN) 2.5 MG tablet Metolazone 2.5 mg twice a week as directed  . metoprolol succinate (TOPROL-XL) 25 MG 24 hr tablet Take 1/2 tab by mouth two times a day  . nitroGLYCERIN (NITROSTAT) 0.4 MG SL tablet Place 1 tablet (0.4 mg total) under the tongue every 5 (five) minutes as needed for chest pain.  . potassium chloride (K-DUR) 10 MEQ tablet Take 2 tablets (20 mEq total)  by mouth 2 (two) times daily.  . silver sulfADIAZINE (SILVADENE) 1 % cream Apply 1 application topically daily.  . simvastatin (ZOCOR) 20 MG tablet Take 20 mg by mouth at bedtime.    Marland Kitchen tiotropium (SPIRIVA) 18 MCG inhalation capsule Place 1 capsule (18 mcg total) into inhaler and inhale daily.  Marland Kitchen torsemide (DEMADEX) 20 MG tablet Take 3 tablets (60 mg total) by mouth 2 (two) times daily.  Marland Kitchen warfarin (COUMADIN) 3 MG tablet Take by mouth as directed. 2 tabs sat sun, 1.5 tabs wed, all other days 1 tablet .   No current facility-administered medications on file prior to visit.     Allergies  Allergen Reactions  . Quinine     Review Of Systems:  Constitutional:   No  weight loss, night sweats,  Fevers, chills, +fatigue, or  lassitude.  HEENT:   No headaches,  Difficulty swallowing,  Tooth/dental problems, or  Sore throat,                No sneezing, itching, ear ache, nasal congestion, post nasal drip,   CV:  No chest pain,  Orthopnea, PND, + swelling in lower extremities, anasarca, dizziness, palpitations, syncope.   GI  No heartburn, indigestion, abdominal pain, nausea, vomiting, diarrhea, change in bowel habits, loss of appetite, bloody stools.   Resp: + shortness of breath with exertion and at rest.  + excess mucus, + productive cough,  No non-productive cough,  No coughing up of blood.  + change in color of mucus.  No wheezing.  No chest wall deformity  Skin: no rash or lesions.  GU: no dysuria, change in color of urine, no urgency or frequency.  No flank pain, no hematuria   MS:  No joint pain or swelling.  No decreased range of motion.  No back pain.  Psych:  No change in mood or affect. No depression or anxiety.  No memory loss.   Vital Signs BP 102/78 mmHg  Pulse 77  Ht 6\' 2"  (1.88 m)  Wt 289 lb (131.09 kg)  BMI 37.09 kg/m2  SpO2 90%   Physical Exam:  General- No distress,  A&Ox3, obese, frail gentleman in wheel chair ENT: No sinus tenderness, TM clear, pale nasal  mucosa, no oral exudate,no post nasal drip, no LAN Cardiac: S1, S2, regular rate and rhythm, no murmur Chest: No wheeze/ + rales/ diminished per bases bilaterally; no accessory muscle use, no nasal flaring, no sternal retractions Abd.: Soft Non-tender Ext: No clubbing cyanosis, 2+edema bilaterally to legs/feet Neuro:  normal strength Skin: No rashes, warm and dry Psych: normal mood and behavior   Assessment/Plan  COPD (chronic obstructive pulmonary disease) with emphysema Moderate flare CHF per CXR contributing Atelectasis and small left pleural effusion Plan: We will get a CXR today. We will get labs today ( CBC with diff, BNP, BMET) We will prescribe  Augmentin which is an antibiotic Please take a probiotic while on antibiotics We will give  you a neb treatment in the office today. If you feel the neb treatments help, we ill place an order for a nebulizer machine. We will also send in a prescription for Duo Nebs that you will use with the nebulizer machine for shortness of breath or wheezing up to every 6 hours as needed. continue spiriva, and symbicort - continue prn albuterol - flutter valve as needed Mucinex 600 mg twice daily with water  Prompt Follow up with cards regarding adjustment of diuretic Follow up with me or [name] in 2-3 weeks Please contact office for sooner follow up if symptoms do not improve or worsen or seek emergency care    Chronic respiratory failure Continue wearing oxygen at 3L [place] Continue VPAP at night. Please contact office for sooner follow up if symptoms do not improve or worsen or seek emergency care  Follow up in 2 weeks with me or [name].     Magdalen Spatz, NP 03/31/2016  3:37 PM

## 2016-03-31 NOTE — Assessment & Plan Note (Addendum)
Continue wearing oxygen at 3L [place] Continue VPAP at night. Please contact office for sooner follow up if symptoms do not improve or worsen or seek emergency care  Follow up in 2 weeks with me or [name].

## 2016-03-31 NOTE — Telephone Encounter (Signed)
Please call William Braun and let him know his CXR indicated that he has some fluid on his lungs that is most likely related to his heart failure. I have tried calling both the cell and home numbers with no answer. I did leave a message, but want to make sure they get this information. Please let him know to call cardiology regarding adjusting his torsemide to get rid of the extra fluid, and to make an appointment with cards to be seen.Tell him to take the antibiotic as prescribed, and we will see him in 2 weeks as scheduled. Thanks.

## 2016-03-31 NOTE — Patient Outreach (Signed)
[facility]) Care Management  03/31/2016  William Braun 1932-10-18 [id number]   Telephone call to patient regarding referral.  HIPAA verified with patient. Discussed and offered [facility] care management services to patient. Patients telephone distorted. Patient states he is unable to hear RNCM clearly.  Request call back on tomorrow.   PLAN:  RNCM will attempt to contact patient within 3 business days.   Quinn Plowman RN,BSN,CCM [facility] Telephonic  [phone]

## 2016-03-31 NOTE — Patient Instructions (Addendum)
It is nice to meet you today. We will get a CXR today. We will get labs today ( CBC with diff, BNP, BMET) We will prescribe Augmentin 875mg  twice daily for 7 days. We will give you a neb treatment in the office today. If you feel the neb treatments help, we ill place an order for a nebulizer machine. We will also send in a prescription for Duo Nebs that you will use with the nebulizer machine for shortness of breath or wheezing up to every 6 hours as needed. continue spiriva, and symbicort - continue prn albuterol - flutter valve as needed Mucinex 600 mg twice daily with water  Follow up with me or [name] in 2-3 weeks Please contact office for sooner follow up if symptoms do not improve or worsen or seek emergency care

## 2016-03-31 NOTE — Assessment & Plan Note (Addendum)
Moderate flare CHF per CXR contributing Atelectasis and small left pleural effusion Plan: We will get a CXR today. We will get labs today ( CBC with diff, BNP, BMET) We will prescribe  Augmentin which is an antibiotic Please take a probiotic while on antibiotics We will give you a neb treatment in the office today. If you feel the neb treatments help, we ill place an order for a nebulizer machine. We will also send in a prescription for Duo Nebs that you will use with the nebulizer machine for shortness of breath or wheezing up to every 6 hours as needed. continue spiriva, and symbicort - continue prn albuterol - flutter valve as needed Mucinex 600 mg twice daily with water  Prompt Follow up with cards regarding adjustment of diuretic Follow up with me or [name] in 2-3 weeks Please contact office for sooner follow up if symptoms do not improve or worsen or seek emergency care

## 2016-03-31 NOTE — Progress Notes (Signed)
Reviewed and agree with assessment/plan.  Chesley Mires, MD [facility] Pulmonary/Critical Care 03/31/2016, 3:44 PM Pager:  [phone]

## 2016-04-01 ENCOUNTER — Ambulatory Visit (INDEPENDENT_AMBULATORY_CARE_PROVIDER_SITE_OTHER): Payer: PPO | Admitting: Podiatry

## 2016-04-01 ENCOUNTER — Other Ambulatory Visit: Payer: Self-pay

## 2016-04-01 ENCOUNTER — Encounter: Payer: Self-pay | Admitting: Podiatry

## 2016-04-01 VITALS — BP 134/66 | HR 72 | Resp 12

## 2016-04-01 DIAGNOSIS — J9610 Chronic respiratory failure, unspecified whether with hypoxia or hypercapnia: Secondary | ICD-10-CM | POA: Diagnosis not present

## 2016-04-01 DIAGNOSIS — I509 Heart failure, unspecified: Secondary | ICD-10-CM | POA: Diagnosis not present

## 2016-04-01 DIAGNOSIS — E1149 Type 2 diabetes mellitus with other diabetic neurological complication: Secondary | ICD-10-CM

## 2016-04-01 DIAGNOSIS — J438 Other emphysema: Secondary | ICD-10-CM | POA: Diagnosis not present

## 2016-04-01 DIAGNOSIS — G4733 Obstructive sleep apnea (adult) (pediatric): Secondary | ICD-10-CM | POA: Diagnosis not present

## 2016-04-01 DIAGNOSIS — L97511 Non-pressure chronic ulcer of other part of right foot limited to breakdown of skin: Secondary | ICD-10-CM

## 2016-04-01 DIAGNOSIS — J449 Chronic obstructive pulmonary disease, unspecified: Secondary | ICD-10-CM | POA: Diagnosis not present

## 2016-04-01 DIAGNOSIS — E662 Morbid (severe) obesity with alveolar hypoventilation: Secondary | ICD-10-CM | POA: Diagnosis not present

## 2016-04-01 DIAGNOSIS — L89891 Pressure ulcer of other site, stage 1: Secondary | ICD-10-CM | POA: Diagnosis not present

## 2016-04-01 DIAGNOSIS — R0602 Shortness of breath: Secondary | ICD-10-CM | POA: Diagnosis not present

## 2016-04-01 NOTE — Telephone Encounter (Signed)
Spoke with pt's wife, aware of recs.  Nothing further needed.  

## 2016-04-01 NOTE — Telephone Encounter (Signed)
LM for pt x 1  

## 2016-04-01 NOTE — Patient Outreach (Addendum)
[facility]) Care Management  04/01/2016  YENG MORATAYA 1933/07/04 [id number]   REFERRAL DATE: 03/28/16  REFERRAL SOURCE: Family referral REFERRAL REASON:  Assistance with copay for oxygen, CPAP, and nebulizer.  SUBJECTIVE:  Telephone call to patient regarding referral. HIPAA verified with patient. Patient gave verbal authorization to speak with his wife, Ajith Gaby regarding all of his health information.  Wife states patient needs assistance with copy for his nebulizer, CPAP, and oxygen.  Wife states they are having to pay approximately $100 copay for this equipment. RNCM explained to patients wife that [facility] care management does not pay copays.  This RNCM discussed and offered [facility] care management services.  Also offered social work services to assist with evaluating finances and/or budget assistance.  Patients wife declined [facility] care management service and social work assistance. Wife verbally agreed to receive [facility] care management outreach letter and brochure.   ASSESSMENT:  Wife request co pay assistance for patients medical equipment.   PLAN;  RNCM will refer patient to [facility] to close due to refusal of services.  RNCM will notify patients primary MD of closure.   Quinn Plowman RN,BSN,CCM [facility] Telephonic  [phone]

## 2016-04-01 NOTE — Telephone Encounter (Signed)
Pt wife returning call.Stanley A Dalton' °

## 2016-04-04 ENCOUNTER — Ambulatory Visit: Payer: Self-pay

## 2016-04-10 DIAGNOSIS — E662 Morbid (severe) obesity with alveolar hypoventilation: Secondary | ICD-10-CM | POA: Diagnosis not present

## 2016-04-10 DIAGNOSIS — R0602 Shortness of breath: Secondary | ICD-10-CM | POA: Diagnosis not present

## 2016-04-10 DIAGNOSIS — J9610 Chronic respiratory failure, unspecified whether with hypoxia or hypercapnia: Secondary | ICD-10-CM | POA: Diagnosis not present

## 2016-04-10 DIAGNOSIS — I509 Heart failure, unspecified: Secondary | ICD-10-CM | POA: Diagnosis not present

## 2016-04-10 DIAGNOSIS — J438 Other emphysema: Secondary | ICD-10-CM | POA: Diagnosis not present

## 2016-04-10 DIAGNOSIS — J449 Chronic obstructive pulmonary disease, unspecified: Secondary | ICD-10-CM | POA: Diagnosis not present

## 2016-04-11 NOTE — Progress Notes (Signed)
Patient ID: William Braun, male   DOB: 19-Jun-1933, 82 y.o.   MRN: [id number]  Subjective: 82 year old male presents the office a follow-up evaluation of a wound to the bottom of his right hallux. Denies any drainage or pus. No swelling to his foot. No redness or warmth to the foot. His wife has continued with daily dressing changes with collagen packing. He denies any systemic complaints such as fevers, chills, nausea, vomiting. No calf pain, chest pain, shortness of breath. No other complaints at this time.  Objective: AAO 3, NAD, presents in wheelchair DP/PT pulses 1/4, CRT less than 3 seconds Protective sensation decreased with Simms Weinstein monofilament On the plantar aspect of the right hallux with an annular type ulceration measuring 0.2 x 0.2 x 0.2. There is hyperkeratotic periwound prior to debridement. There is no probing, undermining or tunneling. There is no surrounding erythema, ascending cellulitis. No edema. No purulence or drainage. There is mild malodor to the wound. No other open lesions or pre-ulcer lesions identified today.  There is no pain with calf compression, swelling, warmth, erythema.  Assessment: 82 year old male with ulceration right hallux with improvement   Plan: -Treatment options discussed including all alternatives, risks, and complications -Wound sharply debrided without complications to healthy, granular wound base. Continue daily dressing changes as before.  -Offloading to the wound. -Monitor for any clinical signs or symptoms of infection and directed to call the office immediately should any occur or go to the ER. -Follow-up as scheduled or sooner if any problems arise. In the meantime, encouraged to call the office with any questions, concerns, change in symptoms.   ABI 06/2615: Technically challenging study secondary to respirations, and inability to recline. No evidence of segmental lower extremity arterial disease at rest, bilaterally. Normal ABI's,  bilaterally. Bilateral great toe pressures are normal.  Celesta Gentile, DPM

## 2016-04-12 DIAGNOSIS — Z7901 Long term (current) use of anticoagulants: Secondary | ICD-10-CM | POA: Diagnosis not present

## 2016-04-18 ENCOUNTER — Ambulatory Visit (HOSPITAL_COMMUNITY)
Admission: RE | Admit: 2016-04-18 | Discharge: 2016-04-18 | Disposition: A | Discharge status: Home / Self Care | Payer: PPO | Source: Ambulatory Visit | Attending: Cardiology | Admitting: Cardiology

## 2016-04-18 ENCOUNTER — Encounter (HOSPITAL_COMMUNITY): Payer: Self-pay

## 2016-04-18 VITALS — BP 124/66 | HR 77 | Wt 290.0 lb

## 2016-04-18 DIAGNOSIS — Z7984 Long term (current) use of oral hypoglycemic drugs: Secondary | ICD-10-CM | POA: Insufficient documentation

## 2016-04-18 DIAGNOSIS — Z8673 Personal history of transient ischemic attack (TIA), and cerebral infarction without residual deficits: Secondary | ICD-10-CM | POA: Diagnosis not present

## 2016-04-18 DIAGNOSIS — J9610 Chronic respiratory failure, unspecified whether with hypoxia or hypercapnia: Secondary | ICD-10-CM | POA: Diagnosis not present

## 2016-04-18 DIAGNOSIS — Z7951 Long term (current) use of inhaled steroids: Secondary | ICD-10-CM | POA: Diagnosis not present

## 2016-04-18 DIAGNOSIS — I4891 Unspecified atrial fibrillation: Secondary | ICD-10-CM | POA: Diagnosis not present

## 2016-04-18 DIAGNOSIS — G4733 Obstructive sleep apnea (adult) (pediatric): Secondary | ICD-10-CM | POA: Diagnosis not present

## 2016-04-18 DIAGNOSIS — Z9981 Dependence on supplemental oxygen: Secondary | ICD-10-CM | POA: Insufficient documentation

## 2016-04-18 DIAGNOSIS — Z6837 Body mass index (BMI) 37.0-37.9, adult: Secondary | ICD-10-CM | POA: Diagnosis not present

## 2016-04-18 DIAGNOSIS — E1122 Type 2 diabetes mellitus with diabetic chronic kidney disease: Secondary | ICD-10-CM | POA: Insufficient documentation

## 2016-04-18 DIAGNOSIS — Z79899 Other long term (current) drug therapy: Secondary | ICD-10-CM | POA: Diagnosis not present

## 2016-04-18 DIAGNOSIS — N183 Chronic kidney disease, stage 3 (moderate): Secondary | ICD-10-CM | POA: Diagnosis not present

## 2016-04-18 DIAGNOSIS — I5032 Chronic diastolic (congestive) heart failure: Secondary | ICD-10-CM | POA: Diagnosis not present

## 2016-04-18 DIAGNOSIS — Z993 Dependence on wheelchair: Secondary | ICD-10-CM | POA: Diagnosis not present

## 2016-04-18 DIAGNOSIS — I482 Chronic atrial fibrillation, unspecified: Secondary | ICD-10-CM

## 2016-04-18 DIAGNOSIS — I130 Hypertensive heart and chronic kidney disease with heart failure and stage 1 through stage 4 chronic kidney disease, or unspecified chronic kidney disease: Secondary | ICD-10-CM | POA: Insufficient documentation

## 2016-04-18 DIAGNOSIS — Z7901 Long term (current) use of anticoagulants: Secondary | ICD-10-CM | POA: Insufficient documentation

## 2016-04-18 DIAGNOSIS — Z856 Personal history of leukemia: Secondary | ICD-10-CM | POA: Diagnosis not present

## 2016-04-18 DIAGNOSIS — E785 Hyperlipidemia, unspecified: Secondary | ICD-10-CM | POA: Diagnosis not present

## 2016-04-18 DIAGNOSIS — E662 Morbid (severe) obesity with alveolar hypoventilation: Secondary | ICD-10-CM | POA: Diagnosis not present

## 2016-04-18 DIAGNOSIS — Z87891 Personal history of nicotine dependence: Secondary | ICD-10-CM | POA: Diagnosis not present

## 2016-04-18 DIAGNOSIS — R0602 Shortness of breath: Secondary | ICD-10-CM | POA: Diagnosis not present

## 2016-04-18 DIAGNOSIS — Z96651 Presence of right artificial knee joint: Secondary | ICD-10-CM | POA: Diagnosis not present

## 2016-04-18 LAB — CBC
HEMATOCRIT: 44.7 % (ref 39.0–52.0)
HEMOGLOBIN: 14.8 g/dL (ref 13.0–17.0)
MCH: 31.2 pg (ref 26.0–34.0)
MCHC: 33.1 g/dL (ref 30.0–36.0)
MCV: 94.1 fL (ref 78.0–100.0)
Platelets: 115 10*3/uL — ABNORMAL LOW (ref 150–400)
RBC: 4.75 MIL/uL (ref 4.22–5.81)
RDW: 15.8 % — ABNORMAL HIGH (ref 11.5–15.5)
WBC: 21.9 10*3/uL — AB (ref 4.0–10.5)

## 2016-04-18 LAB — BASIC METABOLIC PANEL
ANION GAP: 9 (ref 5–15)
BUN: 28 mg/dL — ABNORMAL HIGH (ref 6–20)
CHLORIDE: 99 mmol/L — AB (ref 101–111)
CO2: 29 mmol/L (ref 22–32)
Calcium: 9.1 mg/dL (ref 8.9–10.3)
Creatinine, Ser: 1.49 mg/dL — ABNORMAL HIGH (ref 0.61–1.24)
GFR calc non Af Amer: 43 mL/min — ABNORMAL LOW (ref >60)
GFR, EST AFRICAN AMERICAN: 49 mL/min — AB (ref >60)
GLUCOSE: 287 mg/dL — AB (ref 65–99)
POTASSIUM: 4.5 mmol/L (ref 3.5–5.1)
Sodium: 137 mmol/L (ref 135–145)

## 2016-04-18 LAB — BRAIN NATRIURETIC PEPTIDE: B Natriuretic Peptide: 95.4 pg/mL (ref 0.0–100.0)

## 2016-04-18 MED ORDER — POTASSIUM CHLORIDE ER 10 MEQ PO TBCR: EXTENDED_RELEASE_TABLET | ORAL | 6 refills | Status: DC

## 2016-04-18 MED ORDER — METOLAZONE 2.5 MG PO TABS: ORAL_TABLET | ORAL | 3 refills | Status: DC

## 2016-04-18 NOTE — Patient Instructions (Signed)
Routine lab work today. Will notify you of abnormal results, otherwise no news is good news!  INCREASE metolazone to 2.5 mg *(1 tab) every Monday Wednesday and Friday morning.  INCREASE Potassium to... Monday Wednesday Friday: Take 40 meq (4 tabs) in amd and 20 meq (2 tabs) in pm All other days (Saturday, Sunday, Tuesday, Thursday): Take 20 meq (2 tabs) twice daily  Follow up 2 months with Dr. Aundra Dubin.  Do the following things EVERYDAY: 1) Weigh yourself in the morning before breakfast. Write it down and keep it in a log. 2) Take your medicines as prescribed 3) Eat low salt foods-Limit salt (sodium) to 2000 mg per day.  4) Stay as active as you can everyday 5) Limit all fluids for the day to less than 2 liters

## 2016-04-18 NOTE — Progress Notes (Signed)
Patient ID: William Braun, male   DOB: 1932/12/20, 82 y.o.   MRN: [id number] PCP: Dr. Michail Sermon Pulmonary: Dr Halford Chessman  82 yo with h/o HTN, CVA, diabetes, obesity-hypoventilation syndrome, diastolic CHF, and chronic atrial fibrillation presents for cardiology followup.  He has OHS and uses BIPAP at night and oxygen during the day.  He has atrial fibrillation with reasonable rate control and is on coumadin.   His legs continue to feel weak (normal ABIs in 10/16).    He returns for follow up. Since the last visit he was evaluated by pulmonary and started on mucinex and Augmentin for bronchitis. Overall feeling ok. Ongoing exertional dyspnea. Wears 3 liters oxygen daily.  Weight at home 290-295 pounds. Appetite ok. He is wheelchair-bound. Can stand to go to the bathroom. Taking all medications.    Labs (2/10): creatinine 1.15, BNP 73, TSH normal  Labs (9/12): K 4.1, creatinine 1.4, BNP 90 Labs (11/12): K 4.2, creatinine 1.9 Labs (2/13): K 4.2, creatinine 1.3, proBNP 49, LDL 39, HDL 35 Labs (4/13): K 4, creatinine 1.5, BNP 48 Labs (3/14): K 4 => 3.9, creatinine 1.4 => 1.6 Labs (4/14): K 4, creatinine 1.5 Labs (5/15): K 4.2, creatinine 1.4, BNP 49 Labs (8/15): K 3.7, creatinine 1.4 Labs (10/15): K 3.6, creatinine 1.29 Labs (8/16): K 4.7, creatinine 1.34, HCT 47.9 Labs (11/16): K 4, creatinine 1.23 => 1.39, BNP 78 Labs (2/17): K 3.5, creatinine 1.85 Labs (3/17): K 4.8, creatinine 1.69, BNP 56.5  Allergies (verified):  1) ! Quinine   Past Medical History:  1. Atrial Fibrillation: apparently developed post-op right TKR in 2/10. Pt was started on coumadin. Now chronic.  2. Diabetes Type 2  3. Hyperlipidemia  4. Hypertension  5. Cerebrovascular Disease-CVA-2008  6. Obesity  7. Osteoarthritis left knee, s/p R TKR  8. History of thrombocytopenia  9. Diastolic CHF: Echo (123XX123) with EF 55%, moderately dilated RV with moderate RV systolic dysfunction, PA systolic pressure 36 mmHg.  Echo (3/14) with EF  60-65%, moderate LVH, moderate RV dilation with normal systolic function, PA systolic pressure [id number] mmHg.  Echo (11/16) with EF 60-65%, mild LVH, mild aortic stenosis, mildly dilated aortic root 4.3 cm, PASP 71 mmHg.  10. Obesity hypoventilation syndrome/OSA: BIPAP at night, O2 during the day with exertion. PFTs (1/15) with FEV1 49%, ratio 97%, TLC 59%, DLCO 49% => mixed obstructive/restrictive.  11. Prolonged hospitalization in fall 2011 with Strep agalactiae bacteremia, MRSA PNA, and septic shock.  12. Lexiscan myoview (11/12) with EF 65%, no ischemia or infarction.  13. 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  14. CKD 15. CLL: Dr. Marin Olp 16. ABIs (7/15) normal.  ABIs (10/16) normal.   Family History:  Father: deceased MVA  Mother: deceased MVA   Social History:  Married  Tobacco Use - Former. 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   Current Outpatient Prescriptions  Medication Sig Dispense Refill  . albuterol (VENTOLIN HFA) 108 (90 BASE) MCG/ACT inhaler Inhale 2 puffs into the lungs every 6 (six) hours as needed for wheezing or shortness of breath. 3 Inhaler 1  . amLODipine (NORVASC) 5 MG tablet Take 0.5 tablets (2.5 mg total) by mouth daily. 15 tablet 3  . budesonide-formoterol (SYMBICORT) 160-4.5 MCG/ACT inhaler Inhale 2 puffs into the lungs 2 (two) times daily. 2 puffs twice daily 3 Inhaler 3  . glimepiride (AMARYL) 2 MG tablet Take 4 mg by mouth daily.     Marland Kitchen  HYDROcodone-acetaminophen (VICODIN) 5-500 MG per tablet Take 2 tablets by mouth every 6 (six) hours as needed for pain.     Marland Kitchen ipratropium-albuterol (DUONEB) 0.5-2.5 (3) MG/3ML SOLN Take 3 mLs by nebulization every 6 (six) hours as needed. 360 mL 6  . metFORMIN (GLUCOPHAGE) 500 MG tablet Take 1,000 mg by mouth 2 (two) times daily with a meal.     . metolazone (ZAROXOLYN) 2.5 MG tablet Metolazone 2.5 mg twice a week as directed 60 tablet 3  .  metoprolol succinate (TOPROL-XL) 25 MG 24 hr tablet Take 1/2 tab by mouth two times a day    . OXYGEN Inhale 3 L/min into the lungs.    . potassium chloride (K-DUR) 10 MEQ tablet Take 2 tablets (20 mEq total) by mouth 2 (two) times daily. 120 tablet 3  . silver sulfADIAZINE (SILVADENE) 1 % cream Apply 1 application topically daily. 50 g 0  . simvastatin (ZOCOR) 20 MG tablet Take 20 mg by mouth at bedtime.      Marland Kitchen tiotropium (SPIRIVA) 18 MCG inhalation capsule Place 1 capsule (18 mcg total) into inhaler and inhale daily. 90 capsule 3  . torsemide (DEMADEX) 20 MG tablet Take 3 tablets (60 mg total) by mouth 2 (two) times daily. 540 tablet 3  . warfarin (COUMADIN) 3 MG tablet Take by mouth as directed. 2 tabs sat sun, 1.5 tabs wed, all other days 1 tablet .    . nitroGLYCERIN (NITROSTAT) 0.4 MG SL tablet Place 1 tablet (0.4 mg total) under the tongue every 5 (five) minutes as needed for chest pain. (Patient not taking: Reported on 04/18/2016) 25 tablet 3   No current facility-administered medications for this encounter.     BP 124/66   Pulse 77   Wt 290 lb (131.5 kg) Comment: pt unable to weight today weight at home 04/16/2016  SpO2 93% Comment: on 3L of O2  BMI 37.23 kg/m  General: NAD, obese. Arrived in a scooter.  Neck: Thick, JVP 8-9 cm (difficult), no thyromegaly or thyroid nodule.  Lungs: Crackles at bases bilaterally.   CV: Nondisplaced PMI.  Heart irregular S1/S2, no S3/S4, 2/6 SEM RUSB. 1+ edema to knees bilaterally.  No carotid bruit.  Unable to feel pedal pulses in setting of edema. Abdomen: Soft, nontender, no hepatosplenomegaly, no distention.  Neurologic: Alert and oriented x 3.  Psych: Normal affect. Extremities: No clubbing or cyanosis.   Assessment/Plan: 1. Chronic diastolic CHF: Probably with a component of pulmonary arterial HTN from OHS/OSA with right heart failure.  He has NYHA class IIIb symptoms, stable.  Volume status mildly elevated, increased edema.  - Continue  torsemide 60 mg bid.     - Increase metolazone three times a week. Take an extra 20 meq potassium on metolazone days.   - BMET/BNP today and again in 2 wks.   2. OHS/OSA: Continue nocturnal Bipap and oxygen during the day.   3. Atrial fibrillation: Reasonable rate control on Toprol XL.  Continue coumadin. CBC today.  4. HTN: BP stable. 5. CKD: Stage III.  Check BMET today.   Follow up in 2 months.    Amy Clegg 04/18/2016   Patient seen with NP, agree with the above note.  He is mildly volume overloaded on exam.  Increase metolazone to three times a week as above.  BMET/BNP today and again in 2 wks.   [name] 04/18/2016

## 2016-04-20 ENCOUNTER — Ambulatory Visit (HOSPITAL_BASED_OUTPATIENT_CLINIC_OR_DEPARTMENT_OTHER): Payer: PPO | Admitting: Family

## 2016-04-20 ENCOUNTER — Encounter: Payer: Self-pay | Admitting: Family

## 2016-04-20 ENCOUNTER — Other Ambulatory Visit (HOSPITAL_BASED_OUTPATIENT_CLINIC_OR_DEPARTMENT_OTHER): Payer: PPO

## 2016-04-20 VITALS — BP 107/61 | HR 85 | Temp 97.8°F | Resp 20 | Ht 74.0 in

## 2016-04-20 DIAGNOSIS — C9110 Chronic lymphocytic leukemia of B-cell type not having achieved remission: Secondary | ICD-10-CM

## 2016-04-20 DIAGNOSIS — I509 Heart failure, unspecified: Secondary | ICD-10-CM | POA: Diagnosis not present

## 2016-04-20 DIAGNOSIS — R0602 Shortness of breath: Secondary | ICD-10-CM | POA: Diagnosis not present

## 2016-04-20 LAB — COMPREHENSIVE METABOLIC PANEL
ALT: 11 U/L (ref 0–55)
ANION GAP: 13 meq/L — AB (ref 3–11)
AST: 16 U/L (ref 5–34)
Albumin: 3.5 g/dL (ref 3.5–5.0)
Alkaline Phosphatase: 69 U/L (ref 40–150)
BUN: 30 mg/dL — ABNORMAL HIGH (ref 7.0–26.0)
CALCIUM: 10 mg/dL (ref 8.4–10.4)
CHLORIDE: 97 meq/L — AB (ref 98–109)
CO2: 31 mEq/L — ABNORMAL HIGH (ref 22–29)
CREATININE: 1.8 mg/dL — AB (ref 0.7–1.3)
EGFR: 35 mL/min/{1.73_m2} — AB (ref >90)
Glucose: 207 mg/dl — ABNORMAL HIGH (ref 70–140)
POTASSIUM: 4.4 meq/L (ref 3.5–5.1)
Sodium: 142 mEq/L (ref 136–145)
Total Bilirubin: 0.83 mg/dL (ref 0.20–1.20)
Total Protein: 8.2 g/dL (ref 6.4–8.3)

## 2016-04-20 LAB — CBC WITH DIFFERENTIAL (CANCER CENTER ONLY)
BASO#: 0.1 10*3/uL (ref 0.0–0.2)
BASO%: 0.4 % (ref 0.0–2.0)
EOS ABS: 0.7 10*3/uL — AB (ref 0.0–0.5)
EOS%: 3.1 % (ref 0.0–7.0)
HEMATOCRIT: 44.2 % (ref 38.7–49.9)
HGB: 15 g/dL (ref 13.0–17.1)
LYMPH#: 11.7 10*3/uL — AB (ref 0.9–3.3)
LYMPH%: 51.4 % — ABNORMAL HIGH (ref 14.0–48.0)
MCH: 31.5 pg (ref 28.0–33.4)
MCHC: 33.9 g/dL (ref 32.0–35.9)
MCV: 93 fL (ref 82–98)
MONO#: 1.8 10*3/uL — AB (ref 0.1–0.9)
MONO%: 8 % (ref 0.0–13.0)
NEUT#: 8.4 10*3/uL — ABNORMAL HIGH (ref 1.5–6.5)
NEUT%: 37.1 % — AB (ref 40.0–80.0)
Platelets: 105 10*3/uL — ABNORMAL LOW (ref 145–400)
RBC: 4.76 10*6/uL (ref 4.20–5.70)
RDW: 16.3 % — AB (ref 11.1–15.7)
WBC: 22.8 10*3/uL — ABNORMAL HIGH (ref 4.0–10.0)

## 2016-04-20 LAB — CHCC SATELLITE - SMEAR

## 2016-04-20 LAB — TECHNOLOGIST REVIEW CHCC SATELLITE

## 2016-04-20 NOTE — Progress Notes (Signed)
Hematology and Oncology Follow Up Visit  William Braun [id number] July 06, 1933 82 y.o. 04/20/2016   Principle Diagnosis:  CLL-stage A  Current Therapy:   Observation    Interim History:  William Braun is here today with his wife for a follow-up. He is doing fairly well. His SOB is unchanged. He is now on 3L [place] 24 hours a day. He uses a nebulizer every 6 hours as needed. He does use a CPAP at night. He saw pulmonology earlier this month.  His CHF seems to be controlled with his current medication regimen. He does have a productive cough off and on with grey sputum. He is followed by cardiology.  No fever, chills, n/v, rash, dizziness, chest pain, palpitations, abdominal pain or changes in bowel or bladder habits.   No lymphadenopathy found on exam. No episodes of bleeding or bruising.   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 He has chronic swelling in his lower extremities and takes Demadex and Zaroxolyn daily.  He still has a good appetite and is staying well hydrated. His weight is stable.  He does not really ambulate just stands and pivots into a chair. He uses his scooter to get around.   Medications:    Medication List       Accurate as of 04/20/16  4:43 PM. Always use your most recent med list.          albuterol 108 (90 Base) MCG/ACT inhaler Commonly known as:  VENTOLIN HFA Inhale 2 puffs into the lungs every 6 (six) hours as needed for wheezing or shortness of breath.   amLODipine 5 MG tablet Commonly known as:  NORVASC Take 0.5 tablets (2.5 mg total) by mouth daily.   budesonide-formoterol 160-4.5 MCG/ACT inhaler Commonly known as:  SYMBICORT Inhale 2 puffs into the lungs 2 (two) times daily. 2 puffs twice daily   glimepiride 2 MG tablet Commonly known as:  AMARYL Take 4 mg by mouth daily.   HYDROcodone-acetaminophen 5-500 MG tablet Commonly known as:  VICODIN Take 2 tablets by mouth every 6 (six) hours as needed for pain.   ipratropium-albuterol  0.5-2.5 (3) MG/3ML Soln Commonly known as:  DUONEB Take 3 mLs by nebulization every 6 (six) hours as needed.   metFORMIN 500 MG tablet Commonly known as:  GLUCOPHAGE Take 1,000 mg by mouth 2 (two) times daily with a meal.   metolazone 2.5 MG tablet Commonly known as:  ZAROXOLYN Metolazone 2.5 mg once every Monday Wednesday & Friday mornings   metoprolol succinate 25 MG 24 hr tablet Commonly known as:  TOPROL-XL Take 1/2 tab by mouth two times a day   nitroGLYCERIN 0.4 MG SL tablet Commonly known as:  NITROSTAT Place 1 tablet (0.4 mg total) under the tongue every 5 (five) minutes as needed for chest pain.   OXYGEN Inhale 3 L/min into the lungs.   potassium chloride 10 MEQ tablet Commonly known as:  K-DUR Monday Wednesday Friday Take 40 meq(4 tabs) in am and 20 meq(2 tabs) in pm; Saturday Sunday Tuesday Thursday Take 20 meq(2 tabs) twice daily   silver sulfADIAZINE 1 % cream Commonly known as:  SILVADENE Apply 1 application topically daily.   simvastatin 20 MG tablet Commonly known as:  ZOCOR Take 20 mg by mouth at bedtime.   tiotropium 18 MCG inhalation capsule Commonly known as:  SPIRIVA Place 1 capsule (18 mcg total) into inhaler and inhale daily.   torsemide 20 MG tablet Commonly known as:  DEMADEX Take 3 tablets (  60 mg total) by mouth 2 (two) times daily.   warfarin 3 MG tablet Commonly known as:  COUMADIN Take by mouth as directed. 2 tabs sat sun, 1.5 tabs wed, all other days 1 tablet .       Allergies:  Allergies  Allergen Reactions  . Quinine     Past Medical History, Surgical history, Social history, and Family History were reviewed and updated.  Review of Systems: All other 10 point review of systems is negative.   Physical Exam:  height is 6\' 2"  (1.88 m). His oral temperature is 97.8 F (36.6 C). His blood pressure is 107/61 and his pulse is 85. His respiration is 20 and oxygen saturation is 92%.   Wt Readings from Last 3 Encounters:    04/18/16 290 lb (131.5 kg)  03/31/16 289 lb (131.1 kg)  01/20/16 288 lb (130.6 kg)    Ocular: Sclerae unicteric, pupils equal, round and reactive to light Ear-nose-throat: Oropharynx clear, dentition fair Lymphatic: No cervical supraclavicular or axillary adenopathy Lungs no rales or rhonchi, good excursion bilaterally Heart regular rate and rhythm, no murmur appreciated Abd soft, nontender, positive bowel sounds, no liver or spleen tip palpated on exam MSK no focal spinal tenderness, no joint edema Neuro: non-focal, well-oriented, appropriate affect Breasts: Deferred  Lab Results  Component Value Date   WBC 22.8 (H) 04/20/2016   HGB 15.0 04/20/2016   HCT 44.2 04/20/2016   MCV 93 04/20/2016   PLT 105 (L) 04/20/2016   Lab Results  Component Value Date   FERRITIN 157 06/22/2010   IRON 15 (L) 06/22/2010   TIBC 182 (L) 06/22/2010   UIBC 167 06/22/2010   IRONPCTSAT 8 (L) 06/22/2010   Lab Results  Component Value Date   RETICCTPCT 1.9 06/22/2010   RBC 4.76 04/20/2016   Lab Results  Component Value Date   KPAFRELGTCHN 10.70 (H) 11/17/2014   LAMBDASER 7.06 (H) 11/17/2014   KAPLAMBRATIO 1.13 10/21/2015   Lab Results  Component Value Date   IGGSERUM 1,894 (H) 10/21/2015   IGA 448 (H) 11/17/2014   IGMSERUM 56 10/21/2015   Lab Results  Component Value Date   TOTALPROTELP 7.8 04/23/2014   ALBUMINELP 45.6 (L) 04/23/2014   A1GS 5.7 (H) 04/23/2014   A2GS 10.1 04/23/2014   BETS 7.0 04/23/2014   BETA2SER 6.0 04/23/2014   GAMS 25.6 (H) 04/23/2014   MSPIKE Not Observed 10/21/2015   SPEI * 04/23/2014     Chemistry      Component Value Date/Time   NA 142 04/20/2016 1143   K 4.4 04/20/2016 1143   CL 99 (L) 04/18/2016 1000   CL 93 (L) 04/23/2014 1019   CO2 31 (H) 04/20/2016 1143   BUN 30.0 (H) 04/20/2016 1143   CREATININE 1.8 (H) 04/20/2016 1143      Component Value Date/Time   CALCIUM 10.0 04/20/2016 1143   ALKPHOS 69 04/20/2016 1143   AST 16 04/20/2016 1143    ALT 11 04/20/2016 1143   BILITOT 0.83 04/20/2016 1143     Impression and Plan: William Braun is 82 yo male with stage A CLL documented by flow cytometry. He has multiple health issues including CHF. He has had no issue infections and is asymptomatic with his CLL at this time. His WBC and lymphocyte counts are slightly improved.  His platelet continues to decrease. We will keep an eye on this. He has had no episodes of bleeding or bruising on Coumadin.  No M-spike observed in February. Studies today are pending.  We will plan to see him back in 4 months for labs and follow-up.  Both he and his wife know to call with any questions or concerns. We can certainly see him sooner if need be.   Eliezer Bottom, NP 8/2/20174:43 PM

## 2016-04-21 LAB — KAPPA/LAMBDA LIGHT CHAINS
Ig Kappa Free Light Chain: 97 mg/L — ABNORMAL HIGH (ref 3.3–19.4)
Ig Lambda Free Light Chain: 89.7 mg/L — ABNORMAL HIGH (ref 5.7–26.3)
Kappa/Lambda FluidC Ratio: 1.08 (ref 0.26–1.65)

## 2016-04-22 LAB — MULTIPLE MYELOMA PANEL, SERUM
ALBUMIN/GLOB SERPL: 0.9 (ref 0.7–1.7)
ALPHA 1: 0.2 g/dL (ref 0.0–0.4)
ALPHA2 GLOB SERPL ELPH-MCNC: 0.8 g/dL (ref 0.4–1.0)
Albumin SerPl Elph-Mcnc: 3.6 g/dL (ref 2.9–4.4)
B-Globulin SerPl Elph-Mcnc: 1.1 g/dL (ref 0.7–1.3)
GAMMA GLOB SERPL ELPH-MCNC: 2 g/dL — AB (ref 0.4–1.8)
GLOBULIN, TOTAL: 4.1 g/dL — AB (ref 2.2–3.9)
IgA, Qn, Serum: 358 mg/dL (ref 61–437)
IgM, Qn, Serum: 62 mg/dL (ref 15–143)
Total Protein: 7.7 g/dL (ref 6.0–8.5)

## 2016-04-22 LAB — BETA 2 MICROGLOBULIN, SERUM: BETA 2: 5.5 mg/L — AB (ref 0.6–2.4)

## 2016-04-25 ENCOUNTER — Encounter: Payer: Self-pay | Admitting: Acute Care

## 2016-04-25 ENCOUNTER — Ambulatory Visit (INDEPENDENT_AMBULATORY_CARE_PROVIDER_SITE_OTHER): Payer: PPO | Admitting: Acute Care

## 2016-04-25 DIAGNOSIS — R0600 Dyspnea, unspecified: Secondary | ICD-10-CM

## 2016-04-25 DIAGNOSIS — J9611 Chronic respiratory failure with hypoxia: Secondary | ICD-10-CM

## 2016-04-25 MED ORDER — PREDNISONE 10 MG PO TABS
ORAL_TABLET | ORAL | 0 refills | Status: DC
Start: 2016-04-25 — End: 2016-05-30

## 2016-04-25 NOTE — Progress Notes (Signed)
History of Present Illness William Braun is a 82 y.o. male with COPD, OSA/OHS, Rt hemidiaphragm paralysis, and chronic respiratory failure followed by [name].Other significant history includes CHF, HTN, DM II, and CLL ( [place]).   04/25/2016 Follow Up OV :  Pt. Returns for a follow up visit for his dyspnea and cough. He is continuing to wear his oxygen a 3 L , He is using his DuoNebs every 6 hours and he is complian with his Spiriva and Symbicort.He states that he did complete his  Augmentin prescribed at his last visit.He states he is no better after the treatment with the antibiotic. He is not using his flutter valve because he feels it does not do anything for him excepy make him tired.He is also not using his Mucinex. Both of these were recommended as treatment at the last visit.He did go to see [name], and his swelling is better.He states he is continuing to cough up grey thick secretions. He states that he is having a significant cough, which is productive for these grey secretions. He denies fever, chest pain, orthopnea or hemoptysis. Again feel that some element of his dyspnea is due to his multi-factorial history including COPD, OSA/OHS, right hemidiaphragm paralysis,  heart failure, and overall deconditioning.  Tests CXR 03/31/2016:  Enlargement of cardiac silhouette. Mediastinal contours and pulmonary vascularity normal. Chronic elevation of RIGHT diaphragm. Perihilar infiltrates bilaterally extending to the lower lungs question pulmonary edema, little changed Mild RIGHT basilar atelectasis and tiny LEFT pleural effusions. No pneumothorax.  PFT 2011 >> FEV1 2.04 (66%), FEV1% 65 SNIFF 11/16/12 >> Rt hemidiaphragm paralysis ONO on BiPAP and 2 liters 11/19/12 >> test time 7 hrs 41 min. Mean SpO2 93%, low SpO2 64%. Spent 4 min 12 sec < 88% Echo 11/21/12 >> Mod LVH, EF 60 to 65%, mod LA dilation, mod RV dilation, PAS 55 mmHg BiPAP 02/21/13 to 08/18/13 >> Used on 179 of 179 nights  with average 9 hrs 56 min. Average AHI 1.9 with BiPAP 14/10 cm H2O. PFT 09/30/13 >> FEV1 1.78 (51%), FEV1% 73, TLC 4.67 (59%), DLCO 49%, no BD Echo 07/30/15 >> EF 60 to 65%, mild LVH, mild AS, PAS 71 mmHg BiPAP 12/20/15 to 01/18/16 >> used on 30 of 30 nights with average 11 hrs 40 min. Average AHI 0.4 with BiPAP 14/10 cm H2O   Past medical hx Past Medical History:  Diagnosis Date  . Atrial fibrillation ([place])   . Cerebrovascular disease   . CLL (chronic lymphocytic leukemia) ([place]) 11/17/2014  . History of thrombocytopenia   . Obesity   . Osteoarthritis   . Other and unspecified hyperlipidemia   . Type II or unspecified type diabetes mellitus without mention of complication, not stated as uncontrolled   . Unspecified essential hypertension      Past surgical hx, Family hx, Social hx all reviewed.  Current Outpatient Prescriptions on File Prior to Visit  Medication Sig  . albuterol (VENTOLIN HFA) 108 (90 BASE) MCG/ACT inhaler Inhale 2 puffs into the lungs every 6 (six) hours as needed for wheezing or shortness of breath.  Marland Kitchen amLODipine (NORVASC) 5 MG tablet Take 0.5 tablets (2.5 mg total) by mouth daily.  . budesonide-formoterol (SYMBICORT) 160-4.5 MCG/ACT inhaler Inhale 2 puffs into the lungs 2 (two) times daily. 2 puffs twice daily  . glimepiride (AMARYL) 2 MG tablet Take 4 mg by mouth daily.   Marland Kitchen HYDROcodone-acetaminophen (VICODIN) 5-500 MG per tablet Take 2 tablets by mouth every 6 (six) hours  as needed for pain.   Marland Kitchen ipratropium-albuterol (DUONEB) 0.5-2.5 (3) MG/3ML SOLN Take 3 mLs by nebulization every 6 (six) hours as needed.  . metFORMIN (GLUCOPHAGE) 500 MG tablet Take 1,000 mg by mouth 2 (two) times daily with a meal.   . metolazone (ZAROXOLYN) 2.5 MG tablet Metolazone 2.5 mg once every Monday Wednesday & Friday mornings  . metoprolol succinate (TOPROL-XL) 25 MG 24 hr tablet Take 1/2 tab by mouth two times a day  . nitroGLYCERIN (NITROSTAT) 0.4 MG SL tablet Place 1 tablet (0.4 mg  total) under the tongue every 5 (five) minutes as needed for chest pain.  . OXYGEN Inhale 3 L/min into the lungs.  . potassium chloride (K-DUR) 10 MEQ tablet Monday Wednesday Friday Take 40 meq(4 tabs) in am and 20 meq(2 tabs) in pm; Saturday Sunday Tuesday Thursday Take 20 meq(2 tabs) twice daily  . silver sulfADIAZINE (SILVADENE) 1 % cream Apply 1 application topically daily.  . simvastatin (ZOCOR) 20 MG tablet Take 20 mg by mouth at bedtime.    Marland Kitchen tiotropium (SPIRIVA) 18 MCG inhalation capsule Place 1 capsule (18 mcg total) into inhaler and inhale daily.  Marland Kitchen torsemide (DEMADEX) 20 MG tablet Take 3 tablets (60 mg total) by mouth 2 (two) times daily.  Marland Kitchen warfarin (COUMADIN) 3 MG tablet Take by mouth as directed. 2 tabs sat sun, 1.5 tabs wed, all other days 1 tablet .   No current facility-administered medications on file prior to visit.      Allergies  Allergen Reactions  . Quinine     Review Of Systems:  Constitutional:   No  weight loss, night sweats,  Fevers, chills, fatigue, or  lassitude.  HEENT:   No headaches,  Difficulty swallowing,  Tooth/dental problems, or  Sore throat,                No sneezing, itching, ear ache, nasal congestion, post nasal drip,   CV:  No chest pain,  Orthopnea, PND, + swelling in lower extremities, anasarca, dizziness, palpitations, syncope.   GI  No heartburn, indigestion, abdominal pain, nausea, vomiting, diarrhea, change in bowel habits, loss of appetite, bloody stools.   Resp: + shortness of breath with exertion less at rest.  + excess mucus,+ productive cough,  No non-productive cough,  No coughing up of blood.  + change in color of mucus.  No wheezing.  No chest wall deformity  Skin: no rash or lesions.  GU: no dysuria, change in color of urine, no urgency or frequency.  No flank pain, no hematuria   MS:  No joint pain or swelling.  No decreased range of motion.  No back pain.  Psych:  No change in mood or affect. No depression or anxiety.  No  memory loss.   Vital Signs BP 126/72 (BP Location: Right Arm, Cuff Size: Normal)   Pulse 85   SpO2 93%    Physical Exam:  General- No distress,  A&Ox3, obese, frail gentleman in wheel chair. ENT: No sinus tenderness, TM clear, pale nasal mucosa, no oral exudate,no post nasal drip, no LAN Cardiac: S1, S2, regular rate and rhythm, no murmur Chest: No wheeze/ rales/ dullness; no accessory muscle use, no nasal flaring, no sternal retractions Abd.: Soft Non-tender Ext: No clubbing cyanosis, trace lower extremity edema bilateral lower extremities. Neuro:  MAE x 4, deconditioned at his baseline Skin: No rashes, warm and dry Psych: normal mood and behavior   Assessment/Plan  Dyspnea Continued Dyspnea despite ABX tx and improvement in  Fluid volume status. Multi-factorial 2/2 COPD, OSA/OHS with morbid obesity, heart failure, right hemidiaphragm paralysis and deconditioning. Plan: We will do a prednisone taper for your continued shortness of breath. Prednisone taper; 10 mg tablets: 3 tabs x 2 days, 2 tabs x 2 days, 1 tab x 2 days , then 1/2 tablet x 2 days tab x 2 days then stop. Please take Mucinex as directed  Continue using your Spiriva and Symbicort as you have been doing. Rinse your mouth after use. Continue your Duo-Nebs. Continue your oxygen 3 L during the day, and your VPAP device at night. Follow up appointment in  1 month. Remember to get your flu shot this fall. Schedule 6 month follow up with [name] in early November, this will be his 6 month appointment ( Last seen by [facility] 01/20/2016) Please contact office for sooner follow up if symptoms do not improve or worsen or seek emergency care     Chronic respiratory failure Continue wearing oxygen at 3L [place] Continue VPAP at night. Please contact office for sooner follow up if symptoms do not improve or worsen or seek emergency care  Follow up in 1 month or as needed.    Magdalen Spatz, NP 04/25/2016  2:20  PM

## 2016-04-25 NOTE — Assessment & Plan Note (Signed)
Continue wearing oxygen at 3L [place] Continue VPAP at night. Please contact office for sooner follow up if symptoms do not improve or worsen or seek emergency care  Follow up in 1 month or as needed.

## 2016-04-25 NOTE — Assessment & Plan Note (Signed)
Continued Dyspnea despite ABX tx and improvement in  Fluid volume status. Multi-factorial 2/2 COPD, OSA/OHS with morbid obesity, heart failure, right hemidiaphragm paralysis and deconditioning. Plan: We will do a prednisone taper for your continued shortness of breath. Prednisone taper; 10 mg tablets: 3 tabs x 2 days, 2 tabs x 2 days, 1 tab x 2 days , then 1/2 tablet x 2 days tab x 2 days then stop. Please take Mucinex as directed  Continue using your Spiriva and Symbicort as you have been doing. Rinse your mouth after use. Continue your Duo-Nebs. Continue your oxygen 3 L during the day, and your VPAP device at night. Follow up appointment in  1 month. Remember to get your flu shot this fall. Schedule 6 month follow up with [name] in early November, this will be his 6 month appointment ( Last seen by [facility] 01/20/2016) Please contact office for sooner follow up if symptoms do not improve or worsen or seek emergency care

## 2016-04-25 NOTE — Patient Instructions (Addendum)
It is good to see you again today. We will do a prednisone taper for your continued shortness of breath. Prednisone taper; 10 mg tablets: 3 tabs x 2 days, 2 tabs x 2 days, 1 tab x 2 days , then 1/2 tablet x 2 days tab x 2 days then stop. Please take Mucinex as directed  Continue using your Spiriva and Symbicort as you have been doing. Rinse your mouth after use. Continue your Duo-Nebs. Continue your oxygen 3 L during the day, and your VPAP device at night. Follow up appointment in  1 month. Remember to get your flu shot this fall. Schedule 6 month follow up with [name] in early November, this will be his 6 month appointment ( Last seen by [facility] 01/20/2016) Please contact office for sooner follow up if symptoms do not improve or worsen or seek emergency care

## 2016-04-27 NOTE — Progress Notes (Signed)
Reviewed and agree with assessment/plan.  Chesley Mires, MD [facility] Pulmonary/Critical Care 04/27/2016, 9:58 AM Pager:  [phone]

## 2016-05-02 ENCOUNTER — Ambulatory Visit (HOSPITAL_COMMUNITY)
Admission: RE | Admit: 2016-05-02 | Discharge: 2016-05-02 | Disposition: A | Discharge status: Home / Self Care | Payer: PPO | Source: Ambulatory Visit | Attending: Cardiology | Admitting: Cardiology

## 2016-05-02 DIAGNOSIS — I5032 Chronic diastolic (congestive) heart failure: Secondary | ICD-10-CM | POA: Diagnosis not present

## 2016-05-02 LAB — BASIC METABOLIC PANEL
ANION GAP: 15 (ref 5–15)
BUN: 57 mg/dL — ABNORMAL HIGH (ref 6–20)
CHLORIDE: 88 mmol/L — AB (ref 101–111)
CO2: 34 mmol/L — AB (ref 22–32)
CREATININE: 1.73 mg/dL — AB (ref 0.61–1.24)
Calcium: 9.8 mg/dL (ref 8.9–10.3)
GFR calc non Af Amer: 36 mL/min — ABNORMAL LOW (ref >60)
GFR, EST AFRICAN AMERICAN: 41 mL/min — AB (ref >60)
Glucose, Bld: 210 mg/dL — ABNORMAL HIGH (ref 65–99)
POTASSIUM: 4.5 mmol/L (ref 3.5–5.1)
SODIUM: 137 mmol/L (ref 135–145)

## 2016-05-09 DIAGNOSIS — E11621 Type 2 diabetes mellitus with foot ulcer: Secondary | ICD-10-CM | POA: Diagnosis not present

## 2016-05-09 DIAGNOSIS — I5030 Unspecified diastolic (congestive) heart failure: Secondary | ICD-10-CM | POA: Diagnosis not present

## 2016-05-09 DIAGNOSIS — Z7901 Long term (current) use of anticoagulants: Secondary | ICD-10-CM | POA: Diagnosis not present

## 2016-05-09 DIAGNOSIS — I482 Chronic atrial fibrillation: Secondary | ICD-10-CM | POA: Diagnosis not present

## 2016-05-09 DIAGNOSIS — J449 Chronic obstructive pulmonary disease, unspecified: Secondary | ICD-10-CM | POA: Diagnosis not present

## 2016-05-09 DIAGNOSIS — Z7984 Long term (current) use of oral hypoglycemic drugs: Secondary | ICD-10-CM | POA: Diagnosis not present

## 2016-05-09 DIAGNOSIS — E1140 Type 2 diabetes mellitus with diabetic neuropathy, unspecified: Secondary | ICD-10-CM | POA: Diagnosis not present

## 2016-05-09 DIAGNOSIS — G8929 Other chronic pain: Secondary | ICD-10-CM | POA: Diagnosis not present

## 2016-05-09 DIAGNOSIS — M25512 Pain in left shoulder: Secondary | ICD-10-CM | POA: Diagnosis not present

## 2016-05-09 DIAGNOSIS — L97421 Non-pressure chronic ulcer of left heel and midfoot limited to breakdown of skin: Secondary | ICD-10-CM | POA: Diagnosis not present

## 2016-05-09 DIAGNOSIS — L601 Onycholysis: Secondary | ICD-10-CM | POA: Diagnosis not present

## 2016-05-09 DIAGNOSIS — S99922A Unspecified injury of left foot, initial encounter: Secondary | ICD-10-CM | POA: Diagnosis not present

## 2016-05-11 DIAGNOSIS — I509 Heart failure, unspecified: Secondary | ICD-10-CM | POA: Diagnosis not present

## 2016-05-11 DIAGNOSIS — J438 Other emphysema: Secondary | ICD-10-CM | POA: Diagnosis not present

## 2016-05-11 DIAGNOSIS — J449 Chronic obstructive pulmonary disease, unspecified: Secondary | ICD-10-CM | POA: Diagnosis not present

## 2016-05-11 DIAGNOSIS — E662 Morbid (severe) obesity with alveolar hypoventilation: Secondary | ICD-10-CM | POA: Diagnosis not present

## 2016-05-11 DIAGNOSIS — R0602 Shortness of breath: Secondary | ICD-10-CM | POA: Diagnosis not present

## 2016-05-11 DIAGNOSIS — J9610 Chronic respiratory failure, unspecified whether with hypoxia or hypercapnia: Secondary | ICD-10-CM | POA: Diagnosis not present

## 2016-05-19 DIAGNOSIS — J9610 Chronic respiratory failure, unspecified whether with hypoxia or hypercapnia: Secondary | ICD-10-CM | POA: Diagnosis not present

## 2016-05-19 DIAGNOSIS — R0602 Shortness of breath: Secondary | ICD-10-CM | POA: Diagnosis not present

## 2016-05-19 DIAGNOSIS — G4733 Obstructive sleep apnea (adult) (pediatric): Secondary | ICD-10-CM | POA: Diagnosis not present

## 2016-05-30 ENCOUNTER — Ambulatory Visit (INDEPENDENT_AMBULATORY_CARE_PROVIDER_SITE_OTHER)
Admission: RE | Admit: 2016-05-30 | Discharge: 2016-05-30 | Disposition: A | Discharge status: Home / Self Care | Payer: PPO | Source: Ambulatory Visit | Attending: Acute Care | Admitting: Acute Care

## 2016-05-30 ENCOUNTER — Ambulatory Visit (INDEPENDENT_AMBULATORY_CARE_PROVIDER_SITE_OTHER): Payer: PPO | Admitting: Acute Care

## 2016-05-30 ENCOUNTER — Encounter: Payer: Self-pay | Admitting: Acute Care

## 2016-05-30 VITALS — BP 92/68 | HR 78 | Ht 74.0 in | Wt 297.0 lb

## 2016-05-30 DIAGNOSIS — Z23 Encounter for immunization: Secondary | ICD-10-CM | POA: Diagnosis not present

## 2016-05-30 DIAGNOSIS — J9611 Chronic respiratory failure with hypoxia: Secondary | ICD-10-CM

## 2016-05-30 DIAGNOSIS — R918 Other nonspecific abnormal finding of lung field: Secondary | ICD-10-CM | POA: Diagnosis not present

## 2016-05-30 DIAGNOSIS — J439 Emphysema, unspecified: Secondary | ICD-10-CM | POA: Diagnosis not present

## 2016-05-30 NOTE — Assessment & Plan Note (Addendum)
  Slow to resolve flare  We will check a CXR today. Continue using your Symbicort and Spiriva as you have been doing. Use your Duo neb treatments up to every 6 hours for shortness of breath. Make sure you use them at night before bed as this is when you feel short of breath. Follow up with [name] in 8 weeks. Flu shot today Continue wearing your oxygen as you have been doing. Continue wearing your VPAP every night . Follow Up with [facility] as is scheduled in October. Please contact office for sooner follow up if symptoms do not improve or worsen or seek emergency care

## 2016-05-30 NOTE — Assessment & Plan Note (Signed)
Continue wearing oxygen at 3L [place] as you have been doing. Continue VPAP at night Follow up with [name] in 8 weeks Please contact office for sooner follow up if symptoms do not improve or worsen or seek emergency care

## 2016-05-30 NOTE — Progress Notes (Signed)
History of Present Illness William Braun is a 82 y.o. male with COPD, OSA/OHS, Rt hemidiaphragm paralysis, and chronic respiratory failure followed by [name].Other significant history includes CHF, HTN, DM II, and CLL ( [place])   05/30/2016 Follow Up OV: Pt. States he has completed his prednisone taper and it did not help at all. He still is having trouble breathing at night. He wears oxygen at 3L continuous. He wears his pulsed oxygen during the day.He is compliant with his William Braun and Symbicort. He is using his Duo Nebs only once daily. He states heb has been using his Mucinex. We discussed that he can use them up to every 6 hours.He is not weighing himself daily, as he has been instructed to do per the CHF clinic. He also feel he drinks more than 2 L daily which is the max he is supposed to limit himself to daily.He is compliant with wearing his VPAP every night , and often times during the day. He states that the swelling in his feet is better than it has been.He denies fever, chest pain, hemoptysis. He has a 2 pillow orthopnea. He denies any increase in secretions, however they remain a grey color. This has not changed with treatment. He is wheel chair bound and very deconditioned. He states that his breathing gets better as the day progresses. He does work on ramps for his home and fixing his wife's tractor while bending over in his wheelchair. He has noticed that he is more short of breath after exerting himself this way. He is wheelchair bound and obese.    Tests  CXR 05/30/2016  IMPRESSION: 1. Stable streaky linear opacities in the left lung base suspected to represent atelectasis or possible scarring. No acute pulmonary infiltrates are visualized. 2. Stable enlargement of the cardiomediastinal without overt edema identified  Past medical hx Past Medical History:  Diagnosis Date  . Atrial fibrillation ([place])   . Cerebrovascular disease   . CLL (chronic lymphocytic leukemia) ([place])  11/17/2014  . History of thrombocytopenia   . Obesity   . Osteoarthritis   . Other and unspecified hyperlipidemia   . Type II or unspecified type diabetes mellitus without mention of complication, not stated as uncontrolled   . Unspecified essential hypertension      Past surgical hx, Family hx, Social hx all reviewed.  Current Outpatient Prescriptions on File Prior to Visit  Medication Sig  . albuterol (VENTOLIN HFA) 108 (90 BASE) MCG/ACT inhaler Inhale 2 puffs into the lungs every 6 (six) hours as needed for wheezing or shortness of breath.  Marland Kitchen amLODipine (NORVASC) 5 MG tablet Take 0.5 tablets (2.5 mg total) by mouth daily.  . budesonide-formoterol (SYMBICORT) 160-4.5 MCG/ACT inhaler Inhale 2 puffs into the lungs 2 (two) times daily. 2 puffs twice daily  . glimepiride (AMARYL) 2 MG tablet Take 4 mg by mouth daily.   Marland Kitchen HYDROcodone-acetaminophen (VICODIN) 5-500 MG per tablet Take 2 tablets by mouth every 6 (six) hours as needed for pain.   Marland Kitchen ipratropium-albuterol (DUONEB) 0.5-2.5 (3) MG/3ML SOLN Take 3 mLs by nebulization every 6 (six) hours as needed.  . metFORMIN (GLUCOPHAGE) 500 MG tablet Take 1,000 mg by mouth 2 (two) times daily with a meal.   . metolazone (ZAROXOLYN) 2.5 MG tablet Metolazone 2.5 mg once every Monday Wednesday & Friday mornings  . metoprolol succinate (TOPROL-XL) 25 MG 24 hr tablet Take 1/2 tab by mouth two times a day  . nitroGLYCERIN (NITROSTAT) 0.4 MG SL tablet Place 1  tablet (0.4 mg total) under the tongue every 5 (five) minutes as needed for chest pain.  . OXYGEN Inhale 3 L/min into the lungs.  . potassium chloride (K-DUR) 10 MEQ tablet Monday Wednesday Friday Take 40 meq(4 tabs) in am and 20 meq(2 tabs) in pm; Saturday Sunday Tuesday Thursday Take 20 meq(2 tabs) twice daily  . silver sulfADIAZINE (SILVADENE) 1 % cream Apply 1 application topically daily.  . simvastatin (ZOCOR) 20 MG tablet Take 20 mg by mouth at bedtime.    Marland Kitchen tiotropium (SPIRIVA) 18 MCG inhalation  capsule Place 1 capsule (18 mcg total) into inhaler and inhale daily.  Marland Kitchen torsemide (DEMADEX) 20 MG tablet Take 3 tablets (60 mg total) by mouth 2 (two) times daily.  Marland Kitchen warfarin (COUMADIN) 3 MG tablet Take by mouth as directed. 2 tabs sat sun, 1.5 tabs wed, all other days 1 tablet .   No current facility-administered medications on file prior to visit.      Allergies  Allergen Reactions  . Quinine     Review Of Systems:  Constitutional:   No  weight loss, night sweats,  Fevers, chills, fatigue, or  lassitude.  HEENT:   No headaches,  Difficulty swallowing,  Tooth/dental problems, or  Sore throat,                No sneezing, itching, ear ache, nasal congestion, post nasal drip,   CV:  No chest pain,  Orthopnea, PND, swelling in lower extremities, anasarca, dizziness, palpitations, syncope.   GI  No heartburn, indigestion, abdominal pain, nausea, vomiting, diarrhea, change in bowel habits, loss of appetite, bloody stools.   Resp: + shortness of breath with exertion less at rest.  + excess mucus, + productive cough just in the morning,  No non-productive cough,  No coughing up of blood.  + change in color of mucus.  No wheezing.  No chest wall deformity  Skin: no rash or lesions.  GU: no dysuria, change in color of urine, no urgency or frequency.  No flank pain, no hematuria   MS:  No joint pain or swelling.  No decreased range of motion.  No back pain.  Psych:  No change in mood or affect. No depression or anxiety.  No memory loss.   Vital Signs BP 92/68 (BP Location: Left Arm, Cuff Size: Normal)   Pulse 78   Ht 6\' 2"  (1.88 m)   Wt 297 lb (134.7 kg)   SpO2 90% Comment: ranging 88-90%  BMI 38.13 kg/m    Physical Exam:  General- No distress,  A&Ox3, obese and deconditioned, wheelchair bound ENT: No sinus tenderness, TM clear, pale nasal mucosa, no oral exudate,no post nasal drip, no LAN Cardiac: S1, S2, regular rate and rhythm, no murmur Chest: No wheeze/ rales/ dullness;  no accessory muscle use, no nasal flaring, no sternal retractions, + diminished per bases. Abd.: Soft Non-tender Ext: No clubbing cyanosis, trace bilateral lower extremity edema Neuro:  normal strength Skin: No rashes, warm and dry Psych: normal mood and behavior   Assessment/Plan  COPD (chronic obstructive pulmonary disease) with emphysema  Slow to resolve flare  We will check a CXR today. Continue using your Symbicort and Spiriva as you have been doing. Use your Duo neb treatments up to every 6 hours for shortness of breath. Make sure you use them at night before bed as this is when you feel short of breath. Follow up with [name] in 8 weeks. Flu shot today Continue wearing your oxygen as  you have been doing. Continue wearing your VPAP every night . Follow Up with CHF Clinic as is scheduled in October. Please contact office for sooner follow up if symptoms do not improve or worsen or seek emergency care    Chronic respiratory failure Continue wearing oxygen at 3L [place] as you have been doing. Continue VPAP at night Follow up with [name] in 8 weeks Please contact office for sooner follow up if symptoms do not improve or worsen or seek emergency care     Magdalen Spatz, NP 05/30/2016  5:59 PM

## 2016-05-30 NOTE — Patient Instructions (Addendum)
It is good to see you today. We will check a CXR today. Continue using your Symbicort and Spiriva as you have been doing. Use your Duo neb treatments up to every 6 hours for shortness of breath. Make sure you use them at night before bed. Follow up with [name] in 8 weeks. Flu shot today Continue wearing your oxygen as you have been doing. Continue wearing your VPAP every night . Follow Up with CHF Clinic as is scheduled in October. Please contact office for sooner follow up if symptoms do not improve or worsen or seek emergency care

## 2016-05-31 ENCOUNTER — Telehealth: Payer: Self-pay | Admitting: Acute Care

## 2016-05-31 NOTE — Progress Notes (Signed)
Reviewed and agree with assessment/plan.  Chesley Mires, MD [facility] 05/31/2016, 1:21 AM Pager:  [phone]

## 2016-06-02 NOTE — Telephone Encounter (Signed)
William Braun  Needs to increase his nebulizer use, as we discussed at his appointment, and follow up with the CHF clinic as is already scheduled. If he has a significant worsening he needs to make an earlier appointment or seek emergency care.

## 2016-06-02 NOTE — Telephone Encounter (Signed)
See result note 05/30/2016. Will close this message.

## 2016-06-06 ENCOUNTER — Ambulatory Visit: Payer: PPO | Admitting: Podiatry

## 2016-06-11 DIAGNOSIS — I509 Heart failure, unspecified: Secondary | ICD-10-CM | POA: Diagnosis not present

## 2016-06-11 DIAGNOSIS — E662 Morbid (severe) obesity with alveolar hypoventilation: Secondary | ICD-10-CM | POA: Diagnosis not present

## 2016-06-11 DIAGNOSIS — J9610 Chronic respiratory failure, unspecified whether with hypoxia or hypercapnia: Secondary | ICD-10-CM | POA: Diagnosis not present

## 2016-06-11 DIAGNOSIS — R0602 Shortness of breath: Secondary | ICD-10-CM | POA: Diagnosis not present

## 2016-06-11 DIAGNOSIS — J449 Chronic obstructive pulmonary disease, unspecified: Secondary | ICD-10-CM | POA: Diagnosis not present

## 2016-06-11 DIAGNOSIS — J438 Other emphysema: Secondary | ICD-10-CM | POA: Diagnosis not present

## 2016-06-17 ENCOUNTER — Ambulatory Visit (INDEPENDENT_AMBULATORY_CARE_PROVIDER_SITE_OTHER): Payer: PPO | Admitting: Podiatry

## 2016-06-17 ENCOUNTER — Encounter: Payer: Self-pay | Admitting: Podiatry

## 2016-06-17 DIAGNOSIS — M79675 Pain in left toe(s): Secondary | ICD-10-CM

## 2016-06-17 DIAGNOSIS — L97521 Non-pressure chronic ulcer of other part of left foot limited to breakdown of skin: Secondary | ICD-10-CM

## 2016-06-17 DIAGNOSIS — M79674 Pain in right toe(s): Secondary | ICD-10-CM

## 2016-06-17 DIAGNOSIS — E1149 Type 2 diabetes mellitus with other diabetic neurological complication: Secondary | ICD-10-CM | POA: Diagnosis not present

## 2016-06-17 DIAGNOSIS — B351 Tinea unguium: Secondary | ICD-10-CM

## 2016-06-17 NOTE — Progress Notes (Signed)
Subjective: 82 y.o. returns the office today for painful, elongated, thickened toenails which he cannot trim himself. Denies any redness or drainage around the nails. States that the wound on the right foot did well and is completely healed. He has his own last 2 weeks superficial wound in the top of his left big toe which started as a blister. He has been using a cream that prescribed him previously for this and has been healing. Denies any recent injury or trauma. No swelling or redness or any drainage or pus. Denies any acute changes since last appointment and no new complaints today. Denies any systemic complaints such as fevers, chills, nausea, vomiting.   Objective: AAO 3, NAD; in wheelchair DP/PT pulses palpable, CRT less than 3 seconds  Nails hypertrophic, dystrophic, elongated, brittle, discolored 10. There is tenderness overlying the nails 1-5 bilaterally. There is no surrounding erythema or drainage along the nail sites. The dorsal aspect left hallux of the IPJ is a superficial granular with a small amount of epidermal lysis consistent with a deuce blister. There is no probing, undermining or tunneling. There is no surrounding erythema, ascending cellulitis, fluctuance, crepitus, malodor. No other open lesions are to try this time. There is pre-ulcerative lesions to the right second and third digit on the dorsal aspect of the toes. No signs of infection. No open lesions or pre-ulcerative lesions are identified. No other areas of tenderness bilateral lower extremities. No overlying edema, erythema, increased warmth. No pain with calf compression, swelling, warmth, erythema.  Assessment: Patient presents with symptomatic onychomycosis; Left hallux ulceration Plan: -Treatment options including alternatives, risks, complications were discussed -Nails sharply debrided 10 without complication/bleeding. -Silvadene to the wound daily. Monitor for infection call the office today. Follow up in 3  weeks if not healed.  -Discussed daily foot inspection. If there are any changes, to call the office immediately.  -Follow-up in 3 months or sooner if any problems are to arise. In the meantime, encouraged to call the office with any questions, concerns, changes symptoms.  Celesta Gentile, DPM

## 2016-06-18 DIAGNOSIS — R0602 Shortness of breath: Secondary | ICD-10-CM | POA: Diagnosis not present

## 2016-06-18 DIAGNOSIS — G4733 Obstructive sleep apnea (adult) (pediatric): Secondary | ICD-10-CM | POA: Diagnosis not present

## 2016-06-18 DIAGNOSIS — J9610 Chronic respiratory failure, unspecified whether with hypoxia or hypercapnia: Secondary | ICD-10-CM | POA: Diagnosis not present

## 2016-06-20 ENCOUNTER — Encounter (HOSPITAL_COMMUNITY): Payer: PPO

## 2016-06-21 ENCOUNTER — Ambulatory Visit (HOSPITAL_COMMUNITY)
Admission: RE | Admit: 2016-06-21 | Discharge: 2016-06-21 | Disposition: A | Discharge status: Home / Self Care | Payer: PPO | Source: Ambulatory Visit | Attending: Cardiology | Admitting: Cardiology

## 2016-06-21 VITALS — BP 92/60 | HR 65 | Wt 281.0 lb

## 2016-06-21 DIAGNOSIS — E662 Morbid (severe) obesity with alveolar hypoventilation: Secondary | ICD-10-CM

## 2016-06-21 DIAGNOSIS — N183 Chronic kidney disease, stage 3 (moderate): Secondary | ICD-10-CM | POA: Diagnosis not present

## 2016-06-21 DIAGNOSIS — Z8673 Personal history of transient ischemic attack (TIA), and cerebral infarction without residual deficits: Secondary | ICD-10-CM | POA: Insufficient documentation

## 2016-06-21 DIAGNOSIS — G4733 Obstructive sleep apnea (adult) (pediatric): Secondary | ICD-10-CM | POA: Insufficient documentation

## 2016-06-21 DIAGNOSIS — E1122 Type 2 diabetes mellitus with diabetic chronic kidney disease: Secondary | ICD-10-CM | POA: Insufficient documentation

## 2016-06-21 DIAGNOSIS — I5032 Chronic diastolic (congestive) heart failure: Secondary | ICD-10-CM

## 2016-06-21 DIAGNOSIS — I480 Paroxysmal atrial fibrillation: Secondary | ICD-10-CM | POA: Insufficient documentation

## 2016-06-21 DIAGNOSIS — I130 Hypertensive heart and chronic kidney disease with heart failure and stage 1 through stage 4 chronic kidney disease, or unspecified chronic kidney disease: Secondary | ICD-10-CM | POA: Insufficient documentation

## 2016-06-21 DIAGNOSIS — E669 Obesity, unspecified: Secondary | ICD-10-CM | POA: Insufficient documentation

## 2016-06-21 DIAGNOSIS — I482 Chronic atrial fibrillation, unspecified: Secondary | ICD-10-CM

## 2016-06-21 DIAGNOSIS — Z7901 Long term (current) use of anticoagulants: Secondary | ICD-10-CM | POA: Insufficient documentation

## 2016-06-21 DIAGNOSIS — Z7984 Long term (current) use of oral hypoglycemic drugs: Secondary | ICD-10-CM | POA: Diagnosis not present

## 2016-06-21 DIAGNOSIS — Z79899 Other long term (current) drug therapy: Secondary | ICD-10-CM | POA: Insufficient documentation

## 2016-06-21 LAB — BASIC METABOLIC PANEL WITH GFR
Anion gap: 9 (ref 5–15)
BUN: 43 mg/dL — ABNORMAL HIGH (ref 6–20)
CO2: 35 mmol/L — ABNORMAL HIGH (ref 22–32)
Calcium: 9.7 mg/dL (ref 8.9–10.3)
Chloride: 95 mmol/L — ABNORMAL LOW (ref 101–111)
Creatinine, Ser: 1.91 mg/dL — ABNORMAL HIGH (ref 0.61–1.24)
GFR calc Af Amer: 36 mL/min — ABNORMAL LOW
GFR calc non Af Amer: 31 mL/min — ABNORMAL LOW
Glucose, Bld: 219 mg/dL — ABNORMAL HIGH (ref 65–99)
Potassium: 4.8 mmol/L (ref 3.5–5.1)
Sodium: 139 mmol/L (ref 135–145)

## 2016-06-21 MED ORDER — TORSEMIDE 20 MG PO TABS: 80.0000 mg | ORAL_TABLET | Freq: Two times a day (BID) | ORAL | 3 refills | Status: DC

## 2016-06-21 MED ORDER — TORSEMIDE 20 MG PO TABS: 60.0000 mg | ORAL_TABLET | Freq: Two times a day (BID) | ORAL | 3 refills | Status: DC

## 2016-06-21 MED ORDER — TRAZODONE HCL 50 MG PO TABS: 25.0000 mg | ORAL_TABLET | Freq: Every evening | ORAL | 3 refills | Status: DC | PRN

## 2016-06-21 NOTE — Progress Notes (Signed)
Patient ID: William Braun, male   DOB: 04-12-1933, 82 y.o.   MRN: [id number] PCP: Dr. Michail Sermon Pulmonary: Dr Halford Chessman Cardiology: Dr. Aundra Dubin  82 yo with h/o HTN, CVA, diabetes, obesity-hypoventilation syndrome, diastolic CHF, and chronic atrial fibrillation presents for cardiology followup.  He has OHS and uses BIPAP at night and oxygen during the day.  He has atrial fibrillation with reasonable rate control and is on coumadin.   His legs continue to feel weak (normal ABIs in 10/16).    He returns for follow up. Overall feeling ok.  Wears 3 liters oxygen daily.  Weight is down 9 lbs. He is wheelchair-bound generally but is able to use his walker some around the house.  Recently, has been able to walk further without dyspnea. Limited considerably by knee pain. Taking all medications.  No orthopnea/PND. No chest pain.     Labs (2/10): creatinine 1.15, BNP 73, TSH normal  Labs (9/12): K 4.1, creatinine 1.4, BNP 90 Labs (11/12): K 4.2, creatinine 1.9 Labs (2/13): K 4.2, creatinine 1.3, proBNP 49, LDL 39, HDL 35 Labs (4/13): K 4, creatinine 1.5, BNP 48 Labs (3/14): K 4 => 3.9, creatinine 1.4 => 1.6 Labs (4/14): K 4, creatinine 1.5 Labs (5/15): K 4.2, creatinine 1.4, BNP 49 Labs (8/15): K 3.7, creatinine 1.4 Labs (10/15): K 3.6, creatinine 1.29 Labs (8/16): K 4.7, creatinine 1.34, HCT 47.9 Labs (11/16): K 4, creatinine 1.23 => 1.39, BNP 78 Labs (2/17): K 3.5, creatinine 1.85 Labs (3/17): K 4.8, creatinine 1.69, BNP 56.5 Labs (8/17): K 4.5, creatinine 1.73  Allergies (verified):  1) ! Quinine   Past Medical History:  1. Atrial Fibrillation: apparently developed post-op right TKR in 2/10. Pt was started on coumadin. Now chronic.  2. Diabetes Type 2  3. Hyperlipidemia  4. Hypertension  5. Cerebrovascular Disease-CVA-2008  6. Obesity  7. Osteoarthritis left knee, s/p R TKR  8. History of thrombocytopenia  9. Diastolic CHF: Echo ([id number]) with EF 55%, moderately dilated RV with moderate RV systolic  dysfunction, PA systolic pressure 36 mmHg.  Echo (3/14) with EF 60-65%, moderate LVH, moderate RV dilation with normal systolic function, PA systolic pressure [id number] mmHg.  Echo (11/16) with EF 60-65%, mild LVH, mild aortic stenosis, mildly dilated aortic root 4.3 cm, PASP 71 mmHg.  10. Obesity hypoventilation syndrome/OSA: BIPAP at night, O2 during the day with exertion. PFTs (1/15) with FEV1 49%, ratio 97%, TLC 59%, DLCO 49% => mixed obstructive/restrictive.  11. Prolonged hospitalization in fall 2011 with Strep agalactiae bacteremia, MRSA PNA, and septic shock.  12. Lexiscan myoview (11/12) with EF 65%, no ischemia or infarction.  13. 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  14. CKD 15. CLL: Dr. Marin Olp 16. ABIs (7/15) normal.  ABIs (10/16) normal.   Family History:  Father: deceased MVA  Mother: deceased MVA   Social History:  Married  Tobacco Use - Former. 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   Current Outpatient Prescriptions  Medication Sig Dispense Refill  . albuterol (VENTOLIN HFA) 108 (90 BASE) MCG/ACT inhaler Inhale 2 puffs into the lungs every 6 (six) hours as needed for wheezing or shortness of breath. 3 Inhaler 1  . amLODipine (NORVASC) 5 MG tablet Take 0.5 tablets (2.5 mg total) by mouth daily. 15 tablet 3  . budesonide-formoterol (SYMBICORT) 160-4.5 MCG/ACT inhaler Inhale 2 puffs into the lungs 2 (two) times daily. 2 puffs twice daily 3 Inhaler 3  .  glimepiride (AMARYL) 2 MG tablet Take 4 mg by mouth daily.     Marland Kitchen HYDROcodone-acetaminophen (VICODIN) 5-500 MG per tablet Take 2 tablets by mouth every 6 (six) hours as needed for pain.     Marland Kitchen ipratropium-albuterol (DUONEB) 0.5-2.5 (3) MG/3ML SOLN Take 3 mLs by nebulization every 6 (six) hours as needed. 360 mL 6  . metFORMIN (GLUCOPHAGE) 500 MG tablet Take 1,000 mg by mouth 2 (two) times daily with a meal.     . metolazone (ZAROXOLYN) 2.5 MG  tablet Metolazone 2.5 mg once every Monday Wednesday & Friday mornings 15 tablet 3  . metoprolol succinate (TOPROL-XL) 25 MG 24 hr tablet Take 1/2 tab by mouth two times a day    . OXYGEN Inhale 3 L/min into the lungs.    . potassium chloride (K-DUR) 10 MEQ tablet Monday Wednesday Friday Take 40 meq(4 tabs) in am and 20 meq(2 tabs) in pm; Saturday Sunday Tuesday Thursday Take 20 meq(2 tabs) twice daily 150 tablet 6  . silver sulfADIAZINE (SILVADENE) 1 % cream Apply 1 application topically daily. 50 g 0  . simvastatin (ZOCOR) 20 MG tablet Take 20 mg by mouth at bedtime.      Marland Kitchen tiotropium (SPIRIVA) 18 MCG inhalation capsule Place 1 capsule (18 mcg total) into inhaler and inhale daily. 90 capsule 3  . warfarin (COUMADIN) 3 MG tablet Take by mouth as directed. 2 tabs sat sun, 1.5 tabs wed, all other days 1 tablet .    . nitroGLYCERIN (NITROSTAT) 0.4 MG SL tablet Place 1 tablet (0.4 mg total) under the tongue every 5 (five) minutes as needed for chest pain. (Patient not taking: Reported on 06/21/2016) 25 tablet 3  . torsemide (DEMADEX) 20 MG tablet Take 3 tablets (60 mg total) by mouth 2 (two) times daily. 540 tablet 3  . traZODone (DESYREL) 50 MG tablet Take 0.5 tablets (25 mg total) by mouth at bedtime as needed for sleep. 15 tablet 3   No current facility-administered medications for this encounter.     BP 92/60 (BP Location: Left Arm, Patient Position: Sitting, Cuff Size: Large)   Pulse 65   Wt 281 lb (127.5 kg) Comment: at home  SpO2 95% Comment: 3 L  BMI 36.08 kg/m  General: NAD, obese. Arrived in a scooter.  Neck: Thick, JVP 8-9 cm (difficult), no thyromegaly or thyroid nodule.  Lungs: Crackles at bases bilaterally.   CV: Nondisplaced PMI.  Heart irregular S1/S2, no S3/S4, 2/6 SEM RUSB. 1+ edema ankle edema.  No carotid bruit.  Unable to feel pedal pulses in setting of edema. Abdomen: Soft, nontender, no hepatosplenomegaly, no distention.  Neurologic: Alert and oriented x 3.  Psych:  Normal affect. Extremities: No clubbing or cyanosis.   Assessment/Plan: 1. Chronic diastolic CHF: Probably with a component of pulmonary arterial HTN from OHS/OSA with right heart failure.  He has NYHA class IIIb symptoms, stable.  Peripheral edema looks better, probably only mild volume overload at this point.  Weight is down.  - Continue torsemide 60 mg bid.     - Continue metolazone three times a week. Take an extra 20 meq potassium on metolazone days.   - BMET today.   2. OHS/OSA: Continue nocturnal Bipap and oxygen during the day.   3. Atrial fibrillation: Reasonable rate control on Toprol XL.  Continue coumadin.  4. HTN: BP stable. 5. CKD: Stage III.  Check BMET today.   Follow up in 3 months.    [name] 06/21/2016

## 2016-06-21 NOTE — Progress Notes (Signed)
Advanced Heart Failure Medication Review by a Pharmacist  Does the patient  feel that his/her medications are working for him/her?  yes  Has the patient been experiencing any side effects to the medications prescribed?  no  Does the patient measure his/her own blood pressure or blood glucose at home?  yes   Does the patient have any problems obtaining medications due to transportation or finances?   no  Understanding of regimen: good Understanding of indications: good Potential of compliance: good Patient understands to avoid NSAIDs. Patient understands to avoid decongestants.  Issues to address at subsequent visits: None   Pharmacist comments:  William Braun is a pleasant 82 yo M presenting with his wife and without a medication list. He reports good compliance with his regimen. He does state that lately when he "exerts himself" he gets dizzy/SOB more than usual. He did not have any other medication-related questions or concerns for me at this time.   [name], PharmD, BCPS, CPP Clinical Pharmacist Pager: [phone] Phone: [phone] 06/21/2016 11:11 AM      Time with patient: 10 minutes Preparation and documentation time: 2 minutes Total time: 12 minutes

## 2016-06-21 NOTE — Patient Instructions (Addendum)
Lab today  We will contact you in 3 months to schedule your next appointment.  

## 2016-07-07 DIAGNOSIS — Z7901 Long term (current) use of anticoagulants: Secondary | ICD-10-CM | POA: Diagnosis not present

## 2016-07-11 DIAGNOSIS — J449 Chronic obstructive pulmonary disease, unspecified: Secondary | ICD-10-CM | POA: Diagnosis not present

## 2016-07-11 DIAGNOSIS — I509 Heart failure, unspecified: Secondary | ICD-10-CM | POA: Diagnosis not present

## 2016-07-11 DIAGNOSIS — J438 Other emphysema: Secondary | ICD-10-CM | POA: Diagnosis not present

## 2016-07-11 DIAGNOSIS — E662 Morbid (severe) obesity with alveolar hypoventilation: Secondary | ICD-10-CM | POA: Diagnosis not present

## 2016-07-11 DIAGNOSIS — R0602 Shortness of breath: Secondary | ICD-10-CM | POA: Diagnosis not present

## 2016-07-11 DIAGNOSIS — J9610 Chronic respiratory failure, unspecified whether with hypoxia or hypercapnia: Secondary | ICD-10-CM | POA: Diagnosis not present

## 2016-07-19 DIAGNOSIS — G4733 Obstructive sleep apnea (adult) (pediatric): Secondary | ICD-10-CM | POA: Diagnosis not present

## 2016-07-19 DIAGNOSIS — R0602 Shortness of breath: Secondary | ICD-10-CM | POA: Diagnosis not present

## 2016-07-19 DIAGNOSIS — J9610 Chronic respiratory failure, unspecified whether with hypoxia or hypercapnia: Secondary | ICD-10-CM | POA: Diagnosis not present

## 2016-07-20 ENCOUNTER — Encounter: Payer: Self-pay | Admitting: Pulmonary Disease

## 2016-07-20 ENCOUNTER — Ambulatory Visit (INDEPENDENT_AMBULATORY_CARE_PROVIDER_SITE_OTHER): Payer: PPO | Admitting: Pulmonary Disease

## 2016-07-20 VITALS — BP 126/74 | HR 83 | Ht 74.0 in | Wt 288.0 lb

## 2016-07-20 DIAGNOSIS — G4733 Obstructive sleep apnea (adult) (pediatric): Secondary | ICD-10-CM | POA: Diagnosis not present

## 2016-07-20 DIAGNOSIS — J432 Centrilobular emphysema: Secondary | ICD-10-CM | POA: Diagnosis not present

## 2016-07-20 DIAGNOSIS — E662 Morbid (severe) obesity with alveolar hypoventilation: Secondary | ICD-10-CM | POA: Diagnosis not present

## 2016-07-20 DIAGNOSIS — J986 Disorders of diaphragm: Secondary | ICD-10-CM

## 2016-07-20 DIAGNOSIS — J439 Emphysema, unspecified: Secondary | ICD-10-CM | POA: Diagnosis not present

## 2016-07-20 NOTE — Patient Instructions (Signed)
Follow up in 6 months 

## 2016-07-20 NOTE — Progress Notes (Signed)
Current Outpatient Prescriptions on File Prior to Visit  Medication Sig  . albuterol (VENTOLIN HFA) 108 (90 BASE) MCG/ACT inhaler Inhale 2 puffs into the lungs every 6 (six) hours as needed for wheezing or shortness of breath.  Marland Kitchen amLODipine (NORVASC) 5 MG tablet Take 0.5 tablets (2.5 mg total) by mouth daily.  . budesonide-formoterol (SYMBICORT) 160-4.5 MCG/ACT inhaler Inhale 2 puffs into the lungs 2 (two) times daily. 2 puffs twice daily  . glimepiride (AMARYL) 2 MG tablet Take 4 mg by mouth daily.   Marland Kitchen HYDROcodone-acetaminophen (VICODIN) 5-500 MG per tablet Take 2 tablets by mouth every 6 (six) hours as needed for pain.   Marland Kitchen ipratropium-albuterol (DUONEB) 0.5-2.5 (3) MG/3ML SOLN Take 3 mLs by nebulization every 6 (six) hours as needed.  . metFORMIN (GLUCOPHAGE) 500 MG tablet Take 1,000 mg by mouth 2 (two) times daily with a meal.   . metolazone (ZAROXOLYN) 2.5 MG tablet Metolazone 2.5 mg once every Monday Wednesday & Friday mornings  . metoprolol succinate (TOPROL-XL) 25 MG 24 hr tablet Take 1/2 tab by mouth two times a day  . nitroGLYCERIN (NITROSTAT) 0.4 MG SL tablet Place 1 tablet (0.4 mg total) under the tongue every 5 (five) minutes as needed for chest pain.  . OXYGEN Inhale 3 L/min into the lungs.  . potassium chloride (K-DUR) 10 MEQ tablet Monday Wednesday Friday Take 40 meq(4 tabs) in am and 20 meq(2 tabs) in pm; Saturday Sunday Tuesday Thursday Take 20 meq(2 tabs) twice daily  . silver sulfADIAZINE (SILVADENE) 1 % cream Apply 1 application topically daily.  . simvastatin (ZOCOR) 20 MG tablet Take 20 mg by mouth at bedtime.    Marland Kitchen tiotropium (SPIRIVA) 18 MCG inhalation capsule Place 1 capsule (18 mcg total) into inhaler and inhale daily.  Marland Kitchen torsemide (DEMADEX) 20 MG tablet Take 3 tablets (60 mg total) by mouth 2 (two) times daily.  . traZODone (DESYREL) 50 MG tablet Take 0.5 tablets (25 mg total) by mouth at bedtime as needed for sleep.  Marland Kitchen warfarin (COUMADIN) 3 MG tablet Take by mouth as  directed. 2 tabs sat sun, 1.5 tabs wed, all other days 1 tablet .   No current facility-administered medications on file prior to visit.     Chief Complaint  Patient presents with  . Follow-up    pt states he is baseline today, no new complaints.  tolerating vpap well.     Pulmonary tests PFT 2011 >> FEV1 2.04 (66%), FEV1% 65 SNIFF 11/16/12 >> Rt hemidiaphragm paralysis PFT 09/30/13 >> FEV1 1.78 (51%), FEV1% 73, TLC 4.67 (59%), DLCO 49%, no BD  Cardiac tests Echo 11/21/12 >>  Mod LVH, EF 60 to 65%, mod LA dilation, mod RV dilation, PAS 55 mmHg Echo 07/30/15 >> EF 60 to 65%, mild LVH, mild AS, PAS 71 mmHg  Sleep tests ONO on BiPAP and 2 liters 11/19/12 >> test time 7 hrs 41 min.  Mean SpO2 93%, low SpO2 64%.  Spent 4 min 12 sec < 88% BiPAP 06/19/16 to 07/18/16 >> used on 30 of 30 nights with average 14 hrs 36 min.  Average AHI 0.6 with BiPAP 14/10 cm H2O  Past medical history HTN, HLD, DM, A fib, CVA 2008, CLL  Past surgical history, Family history, Allergies reviewd  Vital signs BP 126/74 (BP Location: Left Arm, Cuff Size: Normal)   Pulse 83   Ht 6\' 2"  (1.88 m)   Wt 288 lb (130.6 kg)   SpO2 92%   BMI 36.98 kg/m  History of Present Illness: William Braun is a 82 y.o. male former smoker with COPD, OSA/OHS, Rt hemidiaphragm paralysis, and chronic respiratory failure.  His breathing has been stable.  He gets occasional cough and clear sputum.  He is not needing to use albuterol.  He uses Bipap nightly.  Needs new mask.  He uses oxygen 24/7.  He has lost about 20 lbs since last year and this has helped.  Physical Exam:  General - in wheel chair, wearing oxygen ENT - No sinus tenderness, no oral exudate, no LAN Cardiac - s1s2 regular, no murmur Chest - decreased breath sounds Rt base, no wheeze Back - No focal tenderness Abd - Soft, non-tender Ext - no edema Neuro - Normal strength Skin - No rashes Psych - normal mood, and behavior  Discussion:   Dyspnea >> related to  COPD, OSA/OHS, morbid obesity, chronic diastolic heart failure, Rt hemidiaphragm paralysis, and deconditioning.  Assessment/Plan:  COPD with emphysema. - continue spiriva, and symbicort - continue prn albuterol - flutter valve as needed  Obstructive sleep apnea. - he is compliant with BiPAP and reports benefit - continue BiPAP 14/10 cm H2O  Chronic respiratory failure with hypoxia >> related to COPD, OSA/OHS, Rt hemidiaphragm paralysis. - continue 2 liters oxygen at rest and with BiPAP at night - he needs to use 4 liters oxygen with exertion   Patient Instructions  Follow up in 6 months    Chesley Mires, MD [place] [facility] Pager:  [phone] 07/20/2016, 11:44 AM

## 2016-07-21 DIAGNOSIS — Z7901 Long term (current) use of anticoagulants: Secondary | ICD-10-CM | POA: Diagnosis not present

## 2016-07-27 ENCOUNTER — Ambulatory Visit: Payer: PPO | Admitting: Pulmonary Disease

## 2016-07-27 DIAGNOSIS — Z7901 Long term (current) use of anticoagulants: Secondary | ICD-10-CM | POA: Diagnosis not present

## 2016-07-27 DIAGNOSIS — R200 Anesthesia of skin: Secondary | ICD-10-CM | POA: Diagnosis not present

## 2016-08-05 DIAGNOSIS — R0602 Shortness of breath: Secondary | ICD-10-CM | POA: Diagnosis not present

## 2016-08-05 DIAGNOSIS — J449 Chronic obstructive pulmonary disease, unspecified: Secondary | ICD-10-CM | POA: Diagnosis not present

## 2016-08-05 DIAGNOSIS — E662 Morbid (severe) obesity with alveolar hypoventilation: Secondary | ICD-10-CM | POA: Diagnosis not present

## 2016-08-05 DIAGNOSIS — J9610 Chronic respiratory failure, unspecified whether with hypoxia or hypercapnia: Secondary | ICD-10-CM | POA: Diagnosis not present

## 2016-08-05 DIAGNOSIS — G4733 Obstructive sleep apnea (adult) (pediatric): Secondary | ICD-10-CM | POA: Diagnosis not present

## 2016-08-05 DIAGNOSIS — J438 Other emphysema: Secondary | ICD-10-CM | POA: Diagnosis not present

## 2016-08-05 DIAGNOSIS — I509 Heart failure, unspecified: Secondary | ICD-10-CM | POA: Diagnosis not present

## 2016-08-08 DIAGNOSIS — N183 Chronic kidney disease, stage 3 (moderate): Secondary | ICD-10-CM | POA: Diagnosis not present

## 2016-08-08 DIAGNOSIS — Z7901 Long term (current) use of anticoagulants: Secondary | ICD-10-CM | POA: Diagnosis not present

## 2016-08-08 DIAGNOSIS — R200 Anesthesia of skin: Secondary | ICD-10-CM | POA: Diagnosis not present

## 2016-08-11 DIAGNOSIS — E662 Morbid (severe) obesity with alveolar hypoventilation: Secondary | ICD-10-CM | POA: Diagnosis not present

## 2016-08-11 DIAGNOSIS — J438 Other emphysema: Secondary | ICD-10-CM | POA: Diagnosis not present

## 2016-08-11 DIAGNOSIS — J9610 Chronic respiratory failure, unspecified whether with hypoxia or hypercapnia: Secondary | ICD-10-CM | POA: Diagnosis not present

## 2016-08-11 DIAGNOSIS — R0602 Shortness of breath: Secondary | ICD-10-CM | POA: Diagnosis not present

## 2016-08-11 DIAGNOSIS — J449 Chronic obstructive pulmonary disease, unspecified: Secondary | ICD-10-CM | POA: Diagnosis not present

## 2016-08-11 DIAGNOSIS — I509 Heart failure, unspecified: Secondary | ICD-10-CM | POA: Diagnosis not present

## 2016-08-15 DIAGNOSIS — Z7901 Long term (current) use of anticoagulants: Secondary | ICD-10-CM | POA: Diagnosis not present

## 2016-08-18 DIAGNOSIS — G4733 Obstructive sleep apnea (adult) (pediatric): Secondary | ICD-10-CM | POA: Diagnosis not present

## 2016-08-18 DIAGNOSIS — J9610 Chronic respiratory failure, unspecified whether with hypoxia or hypercapnia: Secondary | ICD-10-CM | POA: Diagnosis not present

## 2016-08-18 DIAGNOSIS — R0602 Shortness of breath: Secondary | ICD-10-CM | POA: Diagnosis not present

## 2016-08-30 DIAGNOSIS — Z7901 Long term (current) use of anticoagulants: Secondary | ICD-10-CM | POA: Diagnosis not present

## 2016-09-05 ENCOUNTER — Other Ambulatory Visit: Payer: PPO

## 2016-09-05 ENCOUNTER — Ambulatory Visit: Payer: PPO | Admitting: Family

## 2016-09-10 DIAGNOSIS — J449 Chronic obstructive pulmonary disease, unspecified: Secondary | ICD-10-CM | POA: Diagnosis not present

## 2016-09-10 DIAGNOSIS — E662 Morbid (severe) obesity with alveolar hypoventilation: Secondary | ICD-10-CM | POA: Diagnosis not present

## 2016-09-10 DIAGNOSIS — J438 Other emphysema: Secondary | ICD-10-CM | POA: Diagnosis not present

## 2016-09-10 DIAGNOSIS — R0602 Shortness of breath: Secondary | ICD-10-CM | POA: Diagnosis not present

## 2016-09-10 DIAGNOSIS — I509 Heart failure, unspecified: Secondary | ICD-10-CM | POA: Diagnosis not present

## 2016-09-10 DIAGNOSIS — J9610 Chronic respiratory failure, unspecified whether with hypoxia or hypercapnia: Secondary | ICD-10-CM | POA: Diagnosis not present

## 2016-09-14 ENCOUNTER — Other Ambulatory Visit: Payer: Self-pay | Admitting: Nurse Practitioner

## 2016-09-14 ENCOUNTER — Ambulatory Visit
Admission: RE | Admit: 2016-09-14 | Discharge: 2016-09-14 | Disposition: A | Discharge status: Home / Self Care | Payer: PPO | Source: Ambulatory Visit | Attending: Nurse Practitioner | Admitting: Nurse Practitioner

## 2016-09-14 ENCOUNTER — Inpatient Hospital Stay (HOSPITAL_COMMUNITY)
Admission: EM | Admit: 2016-09-14 | Discharge: 2016-09-23 | DRG: 189 | Disposition: A | Discharge status: Home Health | Payer: PPO | Attending: Family Medicine | Admitting: Family Medicine

## 2016-09-14 ENCOUNTER — Encounter (HOSPITAL_COMMUNITY): Payer: Self-pay | Admitting: Emergency Medicine

## 2016-09-14 DIAGNOSIS — Z8249 Family history of ischemic heart disease and other diseases of the circulatory system: Secondary | ICD-10-CM

## 2016-09-14 DIAGNOSIS — R778 Other specified abnormalities of plasma proteins: Secondary | ICD-10-CM | POA: Diagnosis present

## 2016-09-14 DIAGNOSIS — I482 Chronic atrial fibrillation: Secondary | ICD-10-CM | POA: Diagnosis present

## 2016-09-14 DIAGNOSIS — Z7984 Long term (current) use of oral hypoglycemic drugs: Secondary | ICD-10-CM

## 2016-09-14 DIAGNOSIS — I5032 Chronic diastolic (congestive) heart failure: Secondary | ICD-10-CM | POA: Diagnosis not present

## 2016-09-14 DIAGNOSIS — Z9981 Dependence on supplemental oxygen: Secondary | ICD-10-CM

## 2016-09-14 DIAGNOSIS — I248 Other forms of acute ischemic heart disease: Secondary | ICD-10-CM | POA: Diagnosis present

## 2016-09-14 DIAGNOSIS — R0609 Other forms of dyspnea: Secondary | ICD-10-CM | POA: Diagnosis not present

## 2016-09-14 DIAGNOSIS — Y95 Nosocomial condition: Secondary | ICD-10-CM | POA: Diagnosis not present

## 2016-09-14 DIAGNOSIS — E1122 Type 2 diabetes mellitus with diabetic chronic kidney disease: Secondary | ICD-10-CM | POA: Diagnosis not present

## 2016-09-14 DIAGNOSIS — I480 Paroxysmal atrial fibrillation: Secondary | ICD-10-CM | POA: Diagnosis not present

## 2016-09-14 DIAGNOSIS — C9110 Chronic lymphocytic leukemia of B-cell type not having achieved remission: Secondary | ICD-10-CM | POA: Diagnosis present

## 2016-09-14 DIAGNOSIS — J440 Chronic obstructive pulmonary disease with acute lower respiratory infection: Secondary | ICD-10-CM | POA: Diagnosis not present

## 2016-09-14 DIAGNOSIS — R0989 Other specified symptoms and signs involving the circulatory and respiratory systems: Secondary | ICD-10-CM | POA: Diagnosis not present

## 2016-09-14 DIAGNOSIS — Z87891 Personal history of nicotine dependence: Secondary | ICD-10-CM

## 2016-09-14 DIAGNOSIS — E870 Hyperosmolality and hypernatremia: Secondary | ICD-10-CM | POA: Diagnosis not present

## 2016-09-14 DIAGNOSIS — I130 Hypertensive heart and chronic kidney disease with heart failure and stage 1 through stage 4 chronic kidney disease, or unspecified chronic kidney disease: Secondary | ICD-10-CM | POA: Diagnosis present

## 2016-09-14 DIAGNOSIS — J9690 Respiratory failure, unspecified, unspecified whether with hypoxia or hypercapnia: Secondary | ICD-10-CM

## 2016-09-14 DIAGNOSIS — I4891 Unspecified atrial fibrillation: Secondary | ICD-10-CM | POA: Diagnosis present

## 2016-09-14 DIAGNOSIS — T502X5A Adverse effect of carbonic-anhydrase inhibitors, benzothiadiazides and other diuretics, initial encounter: Secondary | ICD-10-CM | POA: Diagnosis present

## 2016-09-14 DIAGNOSIS — J189 Pneumonia, unspecified organism: Secondary | ICD-10-CM | POA: Diagnosis present

## 2016-09-14 DIAGNOSIS — R0602 Shortness of breath: Secondary | ICD-10-CM

## 2016-09-14 DIAGNOSIS — J986 Disorders of diaphragm: Secondary | ICD-10-CM | POA: Diagnosis not present

## 2016-09-14 DIAGNOSIS — Z8673 Personal history of transient ischemic attack (TIA), and cerebral infarction without residual deficits: Secondary | ICD-10-CM

## 2016-09-14 DIAGNOSIS — I472 Ventricular tachycardia: Secondary | ICD-10-CM | POA: Diagnosis not present

## 2016-09-14 DIAGNOSIS — R079 Chest pain, unspecified: Secondary | ICD-10-CM | POA: Diagnosis not present

## 2016-09-14 DIAGNOSIS — D72820 Lymphocytosis (symptomatic): Secondary | ICD-10-CM | POA: Diagnosis not present

## 2016-09-14 DIAGNOSIS — Z888 Allergy status to other drugs, medicaments and biological substances status: Secondary | ICD-10-CM

## 2016-09-14 DIAGNOSIS — G4733 Obstructive sleep apnea (adult) (pediatric): Secondary | ICD-10-CM | POA: Diagnosis not present

## 2016-09-14 DIAGNOSIS — R0902 Hypoxemia: Secondary | ICD-10-CM

## 2016-09-14 DIAGNOSIS — Z7901 Long term (current) use of anticoagulants: Secondary | ICD-10-CM

## 2016-09-14 DIAGNOSIS — E860 Dehydration: Secondary | ICD-10-CM | POA: Diagnosis not present

## 2016-09-14 DIAGNOSIS — Z79899 Other long term (current) drug therapy: Secondary | ICD-10-CM

## 2016-09-14 DIAGNOSIS — J441 Chronic obstructive pulmonary disease with (acute) exacerbation: Secondary | ICD-10-CM | POA: Diagnosis present

## 2016-09-14 DIAGNOSIS — I5033 Acute on chronic diastolic (congestive) heart failure: Secondary | ICD-10-CM | POA: Diagnosis present

## 2016-09-14 DIAGNOSIS — E669 Obesity, unspecified: Secondary | ICD-10-CM | POA: Diagnosis present

## 2016-09-14 DIAGNOSIS — M179 Osteoarthritis of knee, unspecified: Secondary | ICD-10-CM | POA: Diagnosis not present

## 2016-09-14 DIAGNOSIS — M25562 Pain in left knee: Secondary | ICD-10-CM

## 2016-09-14 DIAGNOSIS — J9621 Acute and chronic respiratory failure with hypoxia: Secondary | ICD-10-CM | POA: Diagnosis not present

## 2016-09-14 DIAGNOSIS — I679 Cerebrovascular disease, unspecified: Secondary | ICD-10-CM | POA: Diagnosis not present

## 2016-09-14 DIAGNOSIS — E785 Hyperlipidemia, unspecified: Secondary | ICD-10-CM | POA: Diagnosis present

## 2016-09-14 DIAGNOSIS — J9610 Chronic respiratory failure, unspecified whether with hypoxia or hypercapnia: Secondary | ICD-10-CM | POA: Diagnosis not present

## 2016-09-14 DIAGNOSIS — E1149 Type 2 diabetes mellitus with other diabetic neurological complication: Secondary | ICD-10-CM | POA: Diagnosis not present

## 2016-09-14 DIAGNOSIS — I509 Heart failure, unspecified: Secondary | ICD-10-CM | POA: Diagnosis not present

## 2016-09-14 DIAGNOSIS — G8929 Other chronic pain: Secondary | ICD-10-CM | POA: Diagnosis not present

## 2016-09-14 DIAGNOSIS — E1165 Type 2 diabetes mellitus with hyperglycemia: Secondary | ICD-10-CM | POA: Diagnosis not present

## 2016-09-14 DIAGNOSIS — N183 Chronic kidney disease, stage 3 (moderate): Secondary | ICD-10-CM | POA: Diagnosis not present

## 2016-09-14 DIAGNOSIS — M25462 Effusion, left knee: Secondary | ICD-10-CM | POA: Diagnosis not present

## 2016-09-14 DIAGNOSIS — I272 Pulmonary hypertension, unspecified: Secondary | ICD-10-CM | POA: Diagnosis present

## 2016-09-14 DIAGNOSIS — E876 Hypokalemia: Secondary | ICD-10-CM | POA: Diagnosis present

## 2016-09-14 DIAGNOSIS — M1990 Unspecified osteoarthritis, unspecified site: Secondary | ICD-10-CM | POA: Diagnosis present

## 2016-09-14 DIAGNOSIS — J9601 Acute respiratory failure with hypoxia: Secondary | ICD-10-CM | POA: Diagnosis not present

## 2016-09-14 DIAGNOSIS — E873 Alkalosis: Secondary | ICD-10-CM | POA: Diagnosis present

## 2016-09-14 DIAGNOSIS — I10 Essential (primary) hypertension: Secondary | ICD-10-CM | POA: Diagnosis present

## 2016-09-14 DIAGNOSIS — N189 Chronic kidney disease, unspecified: Secondary | ICD-10-CM

## 2016-09-14 DIAGNOSIS — N179 Acute kidney failure, unspecified: Secondary | ICD-10-CM | POA: Diagnosis not present

## 2016-09-14 DIAGNOSIS — R7989 Other specified abnormal findings of blood chemistry: Secondary | ICD-10-CM

## 2016-09-14 DIAGNOSIS — R918 Other nonspecific abnormal finding of lung field: Secondary | ICD-10-CM | POA: Diagnosis not present

## 2016-09-14 HISTORY — DX: Chronic obstructive pulmonary disease, unspecified: J44.9

## 2016-09-14 HISTORY — DX: Heart failure, unspecified: I50.9

## 2016-09-14 LAB — BASIC METABOLIC PANEL
Anion gap: 20 — ABNORMAL HIGH (ref 5–15)
BUN: 101 mg/dL — ABNORMAL HIGH (ref 6–20)
CHLORIDE: 82 mmol/L — AB (ref 101–111)
CO2: 32 mmol/L (ref 22–32)
CREATININE: 2.49 mg/dL — AB (ref 0.61–1.24)
Calcium: 9.2 mg/dL (ref 8.9–10.3)
GFR calc non Af Amer: 23 mL/min — ABNORMAL LOW (ref >60)
GFR, EST AFRICAN AMERICAN: 26 mL/min — AB (ref >60)
Glucose, Bld: 192 mg/dL — ABNORMAL HIGH (ref 65–99)
POTASSIUM: 3.1 mmol/L — AB (ref 3.5–5.1)
SODIUM: 134 mmol/L — AB (ref 135–145)

## 2016-09-14 LAB — CBC WITH DIFFERENTIAL/PLATELET
BASOS PCT: 0 %
Band Neutrophils: 0 %
Basophils Absolute: 0 10*3/uL (ref 0.0–0.1)
Blasts: 0 %
EOS PCT: 1 %
Eosinophils Absolute: 0.3 10*3/uL (ref 0.0–0.7)
HCT: 45.1 % (ref 39.0–52.0)
Hemoglobin: 15.6 g/dL (ref 13.0–17.0)
LYMPHS ABS: 15.7 10*3/uL — AB (ref 0.7–4.0)
Lymphocytes Relative: 47 %
MCH: 31.1 pg (ref 26.0–34.0)
MCHC: 34.6 g/dL (ref 30.0–36.0)
MCV: 89.8 fL (ref 78.0–100.0)
MONO ABS: 1 10*3/uL (ref 0.1–1.0)
MONOS PCT: 3 %
Metamyelocytes Relative: 0 %
Myelocytes: 0 %
NEUTROS ABS: 16.4 10*3/uL — AB (ref 1.7–7.7)
NEUTROS PCT: 49 %
NRBC: 0 /100{WBCs}
OTHER: 0 %
PLATELETS: 146 10*3/uL — AB (ref 150–400)
Promyelocytes Absolute: 0 %
RBC: 5.02 MIL/uL (ref 4.22–5.81)
RDW: 16.7 % — AB (ref 11.5–15.5)
WBC: 33.4 10*3/uL — AB (ref 4.0–10.5)

## 2016-09-14 LAB — PROTIME-INR
INR: 2.9
Prothrombin Time: 30.9 seconds — ABNORMAL HIGH (ref 11.4–15.2)

## 2016-09-14 LAB — I-STAT VENOUS BLOOD GAS, ED
ACID-BASE EXCESS: 14 mmol/L — AB (ref 0.0–2.0)
BICARBONATE: 40.4 mmol/L — AB (ref 20.0–28.0)
O2 SAT: 75 %
PCO2 VEN: 52.9 mmHg (ref 44.0–60.0)
PO2 VEN: 38 mmHg (ref 32.0–45.0)
TCO2: 42 mmol/L (ref 0–100)
pH, Ven: 7.491 — ABNORMAL HIGH (ref 7.250–7.430)

## 2016-09-14 LAB — MAGNESIUM: MAGNESIUM: 2.3 mg/dL (ref 1.7–2.4)

## 2016-09-14 LAB — TROPONIN I
TROPONIN I: 0.03 ng/mL — AB (ref <0.03)
TROPONIN I: 0.06 ng/mL — AB (ref <0.03)

## 2016-09-14 LAB — BRAIN NATRIURETIC PEPTIDE: B Natriuretic Peptide: 126 pg/mL — ABNORMAL HIGH (ref 0.0–100.0)

## 2016-09-14 MED ORDER — MOMETASONE FURO-FORMOTEROL FUM 200-5 MCG/ACT IN AERO
2.0000 | INHALATION_SPRAY | Freq: Two times a day (BID) | RESPIRATORY_TRACT | Status: DC
  Administered 2016-09-15 – 2016-09-18 (×7): 2 via RESPIRATORY_TRACT
  Filled 2016-09-14: qty 8.8

## 2016-09-14 MED ORDER — WARFARIN - PHARMACIST DOSING INPATIENT
Freq: Every day | Status: DC
  Administered 2016-09-16 – 2016-09-21 (×3)

## 2016-09-14 MED ORDER — TRAZODONE HCL 50 MG PO TABS: 25.0000 mg | ORAL_TABLET | Freq: Every evening | ORAL | Status: DC | PRN

## 2016-09-14 MED ORDER — DM-GUAIFENESIN ER 30-600 MG PO TB12
1.0000 | ORAL_TABLET | Freq: Two times a day (BID) | ORAL | Status: DC
  Administered 2016-09-14 – 2016-09-23 (×17): 1 via ORAL
  Filled 2016-09-14 (×18): qty 1

## 2016-09-14 MED ORDER — ALBUTEROL SULFATE (2.5 MG/3ML) 0.083% IN NEBU
2.5000 mg | INHALATION_SOLUTION | RESPIRATORY_TRACT | Status: DC | PRN
  Administered 2016-09-18: 2.5 mg via RESPIRATORY_TRACT
  Filled 2016-09-14: qty 3

## 2016-09-14 MED ORDER — METHYLPREDNISOLONE SODIUM SUCC 125 MG IJ SOLR
60.0000 mg | Freq: Two times a day (BID) | INTRAMUSCULAR | Status: DC
  Administered 2016-09-15 – 2016-09-16 (×3): 60 mg via INTRAVENOUS
  Filled 2016-09-14 (×4): qty 2

## 2016-09-14 MED ORDER — INSULIN ASPART 100 UNIT/ML ~~LOC~~ SOLN
0.0000 [IU] | Freq: Three times a day (TID) | SUBCUTANEOUS | Status: DC
  Administered 2016-09-15: 5 [IU] via SUBCUTANEOUS
  Administered 2016-09-15: 7 [IU] via SUBCUTANEOUS
  Filled 2016-09-14 (×2): qty 1

## 2016-09-14 MED ORDER — METOPROLOL SUCCINATE ER 25 MG PO TB24
12.5000 mg | ORAL_TABLET | Freq: Every day | ORAL | Status: DC
  Administered 2016-09-15 – 2016-09-23 (×8): 12.5 mg via ORAL
  Filled 2016-09-14 (×9): qty 1

## 2016-09-14 MED ORDER — TIOTROPIUM BROMIDE MONOHYDRATE 18 MCG IN CAPS: 18.0000 ug | ORAL_CAPSULE | Freq: Every day | RESPIRATORY_TRACT | Status: DC

## 2016-09-14 MED ORDER — HYDROCODONE-ACETAMINOPHEN 5-325 MG PO TABS
1.0000 | ORAL_TABLET | Freq: Four times a day (QID) | ORAL | Status: DC | PRN
  Administered 2016-09-14 – 2016-09-23 (×12): 1 via ORAL
  Filled 2016-09-14 (×12): qty 1

## 2016-09-14 MED ORDER — NITROGLYCERIN 0.4 MG SL SUBL: 0.4000 mg | SUBLINGUAL_TABLET | SUBLINGUAL | Status: DC | PRN

## 2016-09-14 MED ORDER — IPRATROPIUM-ALBUTEROL 0.5-2.5 (3) MG/3ML IN SOLN
3.0000 mL | RESPIRATORY_TRACT | Status: DC
  Administered 2016-09-14 – 2016-09-15 (×6): 3 mL via RESPIRATORY_TRACT
  Filled 2016-09-14 (×6): qty 3

## 2016-09-14 MED ORDER — FUROSEMIDE 10 MG/ML IJ SOLN
60.0000 mg | INTRAMUSCULAR | Status: DC
  Filled 2016-09-14: qty 6

## 2016-09-14 MED ORDER — TORSEMIDE 20 MG PO TABS
60.0000 mg | ORAL_TABLET | Freq: Two times a day (BID) | ORAL | Status: DC
  Administered 2016-09-15 (×2): 60 mg via ORAL
  Filled 2016-09-14 (×2): qty 3

## 2016-09-14 MED ORDER — INSULIN ASPART 100 UNIT/ML ~~LOC~~ SOLN: 0.0000 [IU] | Freq: Every day | SUBCUTANEOUS | Status: DC

## 2016-09-14 MED ORDER — POTASSIUM CHLORIDE 20 MEQ/15ML (10%) PO SOLN
40.0000 meq | Freq: Once | ORAL | Status: AC
Start: 2016-09-14 — End: 2016-09-14
  Administered 2016-09-14: 40 meq via ORAL
  Filled 2016-09-14: qty 30

## 2016-09-14 MED ORDER — SIMVASTATIN 20 MG PO TABS
20.0000 mg | ORAL_TABLET | Freq: Every day | ORAL | Status: DC
  Administered 2016-09-14 – 2016-09-22 (×9): 20 mg via ORAL
  Filled 2016-09-14 (×10): qty 1

## 2016-09-14 MED ORDER — AMLODIPINE BESYLATE 2.5 MG PO TABS
2.5000 mg | ORAL_TABLET | Freq: Every day | ORAL | Status: DC
  Administered 2016-09-15 – 2016-09-23 (×8): 2.5 mg via ORAL
  Filled 2016-09-14 (×9): qty 1

## 2016-09-14 NOTE — ED Notes (Signed)
Pt unable to tolerate face mask any longer, requested nasal cannula to be back on.   Placed [place] on 5L will continue to monitor

## 2016-09-14 NOTE — ED Provider Notes (Signed)
[facility] DEPT Provider Note   CSN: [id number] Arrival date & time: 09/14/16 1723     History    Chief Complaint  Patient presents with  . Shortness of Breath     HPI William Braun is a 82 y.o. male.  82yo M w/ PMH including COPD on 3L O2, A fib, CLL, CHF, T2DM, A fib who p/w SOB. Patient reports a 2 week history of progressively worsening shortness of breath. He has a dry cough with no new sputum production or worsening of cough. No associated fevers, vomiting, or chest pain. He saw his PCP today who noted a low O2 saturations on his normal 3 L of oxygen so he was sent here for further evaluation. He denies any orthopnea, worsening lower extremity edema, or weight gain recently.   Past Medical History:  Diagnosis Date  . Atrial fibrillation ([place])   . Cerebrovascular disease   . CHF (congestive heart failure) ([name])   . CLL (chronic lymphocytic leukemia) ([name]) 11/17/2014  . COPD (chronic obstructive pulmonary disease) ([name])   . History of thrombocytopenia   . Obesity   . Osteoarthritis   . Other and unspecified hyperlipidemia   . Type II or unspecified type diabetes mellitus without mention of complication, not stated as uncontrolled   . Unspecified essential hypertension      Patient Active Problem List   Diagnosis Date Noted  . Toe ulcer, right ([name]) 02/02/2016  . Type II diabetes mellitus with neurological manifestations ([name]) 02/02/2016  . CLL (chronic lymphocytic leukemia) ([name]) 11/17/2014  . Elevated WBCs 04/23/2014  . Other emphysema ([name]) 09/27/2013  . Dyspnea 08/13/2013  . 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 Chest pain, unspecified 08/03/2011  . Chronic diastolic CHF (congestive heart failure) ([name]) 05/11/2011  . Obesity hypoventilation syndrome ([name]) 03/12/2010  . CONGESTIVE HEART FAILURE, BIVENTRICULAR DYSFUNCTION 03/12/2010  . COPD (chronic obstructive pulmonary disease) with emphysema ([place]) 03/12/2010  . Chronic respiratory failure ([name]) 02/19/2010  .  HYPERLIPIDEMIA-MIXED 12/03/2008  . Morbid obesity ([name]) 12/03/2008  . HYPERTENSION, UNSPECIFIED 12/03/2008  . ATRIAL FIBRILLATION 12/03/2008  . PERIPHERAL EDEMA 12/03/2008    Past Surgical History:  Procedure Laterality Date  . KNEE ARTHROSCOPY W/ ALLOGRAFT IMPANT    . VASECTOMY          Home Medications    Prior to Admission medications   Medication Sig Start Date End Date Taking? Authorizing Provider  albuterol (VENTOLIN HFA) 108 (90 BASE) MCG/ACT inhaler Inhale 2 puffs into the lungs every 6 (six) hours as needed for wheezing or shortness of breath. 07/23/15   Chesley Mires, MD  amLODipine (NORVASC) 5 MG tablet Take 0.5 tablets (2.5 mg total) by mouth daily. 08/10/15   Larey Dresser, MD  budesonide-formoterol [facility]) 160-4.5 MCG/ACT inhaler Inhale 2 puffs into the lungs 2 (two) times daily. 2 puffs twice daily 11/13/15   Chesley Mires, MD  glimepiride (AMARYL) 2 MG tablet Take 4 mg by mouth daily.  06/23/15   Historical Provider, MD  HYDROcodone-acetaminophen (VICODIN) 5-500 MG per tablet Take 2 tablets by mouth every 6 (six) hours as needed for pain.     Historical Provider, MD  ipratropium-albuterol (DUONEB) 0.5-2.5 (3) MG/3ML SOLN Take 3 mLs by nebulization every 6 (six) hours as needed. 03/31/16   Magdalen Spatz, NP  metFORMIN (GLUCOPHAGE) 500 MG tablet Take 1,000 mg by mouth 2 (two) times daily with a meal.  07/20/11   Historical Provider, MD  metolazone (ZAROXOLYN) 2.5 MG tablet Metolazone 2.5 mg once every Monday  Wednesday & Friday mornings 04/18/16   Larey Dresser, MD  metoprolol succinate (TOPROL-XL) 25 MG 24 hr tablet Take 1/2 tab by mouth two times a day    Historical Provider, MD  nitroGLYCERIN (NITROSTAT) 0.4 MG SL tablet Place 1 tablet (0.4 mg total) under the tongue every 5 (five) minutes as needed for chest pain. 08/03/11   Liliane Shi, PA-C  OXYGEN Inhale 3 L/min into the lungs.    Historical Provider, MD  potassium chloride (K-DUR) 10 MEQ tablet Monday Wednesday  Friday Take 40 meq(4 tabs) in am and 20 meq(2 tabs) in pm; Saturday Sunday Tuesday Thursday Take 20 meq(2 tabs) twice daily 04/18/16   Larey Dresser, MD  silver sulfADIAZINE (SILVADENE) 1 % cream Apply 1 application topically daily. 06/15/15   Trula Slade, DPM  simvastatin (ZOCOR) 20 MG tablet Take 20 mg by mouth at bedtime.      Historical Provider, MD  tiotropium (SPIRIVA) 18 MCG inhalation capsule Place 1 capsule (18 mcg total) into inhaler and inhale daily. 11/13/15   Chesley Mires, MD  torsemide (DEMADEX) 20 MG tablet Take 3 tablets (60 mg total) by mouth 2 (two) times daily. 06/21/16   Larey Dresser, MD  traZODone (DESYREL) 50 MG tablet Take 0.5 tablets (25 mg total) by mouth at bedtime as needed for sleep. 06/21/16   Larey Dresser, MD  warfarin (COUMADIN) 3 MG tablet Take by mouth as directed. 2 tabs sat sun, 1.5 tabs wed, all other days 1 tablet .    Historical Provider, MD      Family History  Problem Relation Age of Onset  . Heart disease Father      Social History  Substance Use Topics  . Smoking status: Former Smoker    Packs/day: 2.00    Years: 34.00    Types: Cigarettes    Start date: 04/23/1946    Quit date: 09/19/1978  . Smokeless tobacco: Never Used     Comment: quit 35 years ago  . Alcohol use No     Allergies     Quinine    Review of Systems  10 Systems reviewed and are negative for acute change except as noted in the HPI.   Physical Exam Updated Vital Signs BP 104/69 (BP Location: Right Arm)   Pulse 105   Temp 98 F (36.7 C) (Oral)   SpO2 (!) 83%   Physical Exam  Constitutional: He is oriented to person, place, and time. He appears well-developed and well-nourished. No distress.  HENT:  Head: Normocephalic and atraumatic.  dry mucous membranes  Eyes: Conjunctivae are normal. Pupils are equal, round, and reactive to light.  Neck: Neck supple.  Cardiovascular: Normal rate, regular rhythm and normal heart sounds.   No murmur  heard. Pulmonary/Chest: No respiratory distress. He has wheezes.  Mild dyspnea but able to speak in full sentences, diminished BS b/l  Abdominal: Soft. Bowel sounds are normal. He exhibits no distension. There is no tenderness.  Musculoskeletal: He exhibits edema (trace BLE).  Neurological: He is alert and oriented to person, place, and time.  Fluent speech  Skin: Skin is warm and dry.  Psychiatric: He has a normal mood and affect. Judgment normal.  Nursing note and vitals reviewed.     ED Treatments / Results  Labs (all labs ordered are listed, but only abnormal results are displayed) Labs Reviewed  BASIC METABOLIC PANEL  CBC WITH DIFFERENTIAL/PLATELET  TROPONIN I  BRAIN NATRIURETIC PEPTIDE  PROTIME-INR  EKG  EKG Interpretation  Date/Time:  Wednesday September 14 2016 17:38:52 EST Ventricular Rate:  103 PR Interval:    QRS Duration: 120 QT Interval:  378 QTC Calculation: 495 R Axis:   -20 Text Interpretation:  Atrial fibrillation with rapid ventricular response with premature ventricular or aberrantly conducted complexes Right bundle branch block Cannot rule out Anterior infarct , age undetermined Abnormal ECG since previous tracing, rate faster occasional PVC Confirmed by Bascom Biel MD, Emerald Gehres [phone]) on 09/14/2016 6:23:24 PM         Radiology Dg Chest 2 View  Result Date: 09/14/2016 CLINICAL DATA:  82 year old male with shortness breath and chest pain. EXAM: CHEST  2 VIEW COMPARISON:  Chest x-ray a 05/30/2016. FINDINGS: Lung volumes are low. Persistent elevation of the right hemidiaphragm is unchanged. No acute consolidative airspace disease. No pleural effusions. No evidence of pulmonary edema. Heart size appears borderline enlarged. The patient is rotated to the right on today's exam, resulting in distortion of the mediastinal contours and reduced diagnostic sensitivity and specificity for mediastinal pathology. IMPRESSION: 1. Low lung volumes without radiographic  evidence of acute cardiopulmonary disease. Electronically Signed   By: Vinnie Langton M.D.   On: 09/14/2016 15:09   Dg Knee 1-2 Views Left  Result Date: 09/14/2016 CLINICAL DATA:  Chronic pain and swelling.  No injury. EXAM: LEFT KNEE - 1-2 VIEW COMPARISON:  None. FINDINGS: No acute fracture deformity or dislocation. Severe medial compartment narrowing with periarticular sclerosis and marginal spurring. Mild patellofemoral and lateral compartment marginal spurring. No destructive bony lesions. Moderate suprapatellar joint effusion. Mild vascular calcifications. Scattered popliteal phleboliths. IMPRESSION: Severe medial compartment osteoarthrosis. Moderate suprapatellar joint effusion. Electronically Signed   By: Elon Alas M.D.   On: 09/14/2016 15:07    Procedures Procedures (including critical care time) Procedures  Medications Ordered in ED  Medications - No data to display   Initial Impression / Assessment and Plan / ED Course  I have reviewed the triage vital signs and the nursing notes.  Pertinent labs & imaging results that were available during my care of the patient were reviewed by me and considered in my medical decision making (see chart for details).  Clinical Course      Patient presents with progressively worsening shortness of breath, increased oxygen requirement noted at PCP. He was dyspneic but mentating appropriately on arrival. He was hypoxic, requiring 5 L O2 to maintain normal saturations. No obvious wheezing on exam. He had mild bilateral lower extremity edema. Chest x-ray obtained earlier today was negative for acute process. Labs show acute on chronic kidney disease with creatinine 2.49, BUN 101; baseline is closer to 1.5. WBC 33,000 and however patient has chronic leukocytosis likely related to CLL. Initial troponin 0.03, INR 2.9 making PE less likely. I suspect a component of volume overload and gave a dose of IV Lasix. Discussed admission with [facility]  hospitalist, Dr. Blaine Hamper, and pt admitted for further treatment.   Final Clinical Impressions(s) / ED Diagnoses   Final diagnoses:  Shortness of breath  Hypoxia  Acute renal failure superimposed on chronic kidney disease, unspecified CKD stage, unspecified acute renal failure type [facility])     New Prescriptions   No medications on file       Sharlett Iles, MD 09/15/16 [phone]

## 2016-09-14 NOTE — H&P (Signed)
History and Physical    LEDGER SILERIO [id number] DOB: [birth year] DOA: 09/14/2016  Referring MD/NP/PA:   PCP: Ileana Roup, MD   Patient coming from:  The patient is coming from home.  At baseline, pt is independent for most of ADL.    Chief Complaint: Worsening Shortness of breath  HPI: William Braun is a 82 y.o. male with medical history significant of hypertension, hyperlipidemia, diabetes mellitus, COPD on 2 L oxygen at home, CLL, dCHF, atrial fibrillation on Coumadin, stroke, CKD-III, who presents with worsening shortness of breath.  Patient states that he has worsening shortness of breath in the past 2 days, which has been progressively getting worse. He can still speak in full sentences. He has mild dry cough, no runny nose or sore throat. Patient denies any chest pain, fever or chills. No nausea, vomiting, abdominal pain, diarrhea, symptoms of UTI or unilateral weakness. He states that he has left knee pain which has been going on for more than every month.  ED Course: pt was found to have oxygen desaturation to 83% on 2 L oxygen, positive troponin 0.03, BNP 126, INR 2.90, WBC 33.4, potassium 3.1, worsening renal function. Chest x-ray showed low lung volume without infiltration. X-ray of left knee showed severe medial compartment osteoarthrosis and moderate suprapatellar joint effusion. Pt is admitted to stepdown as inpatient. BiPAP is started.  Review of Systems:   General: no fevers, chills, no changes in body weight, has poor appetite, has fatigue HEENT: no blurry vision, hearing changes or sore throat Respiratory: has dyspnea, coughing, wheezing CV: no chest pain, no palpitations GI: no nausea, vomiting, abdominal pain, diarrhea, constipation GU: no dysuria, burning on urination, increased urinary frequency, hematuria  Ext: has mild leg edema Neuro: no unilateral weakness, numbness, or tingling, no vision change or hearing loss Skin: no rash, no skin tear. MSK: has  left knee pain Heme: No easy bruising.  Travel history: No recent long distant travel.  Allergy:  Allergies  Allergen Reactions  . Quinine     Seizure     Past Medical History:  Diagnosis Date  . Atrial fibrillation ([place])   . Cerebrovascular disease   . CHF (congestive heart failure) ([place])   . CLL (chronic lymphocytic leukemia) ([place]) 11/17/2014  . COPD (chronic obstructive pulmonary disease) ([place])   . History of thrombocytopenia   . Obesity   . Osteoarthritis   . Other and unspecified hyperlipidemia   . Type II or unspecified type diabetes mellitus without mention of complication, not stated as uncontrolled   . Unspecified essential hypertension     Past Surgical History:  Procedure Laterality Date  . KNEE ARTHROSCOPY W/ ALLOGRAFT IMPANT    . VASECTOMY      Social History:  reports that he quit smoking about 38 years ago. His smoking use included Cigarettes. He started smoking about 70 years ago. He has a 68.00 pack-year smoking history. He has never used smokeless tobacco. He reports that he does not drink alcohol. His drug history is not on file.  Family History:  Family History  Problem Relation Age of Onset  . Heart disease Father      Prior to Admission medications   Medication Sig Start Date End Date Taking? Authorizing Provider  albuterol (VENTOLIN HFA) 108 (90 BASE) MCG/ACT inhaler Inhale 2 puffs into the lungs every 6 (six) hours as needed for wheezing or shortness of breath. 07/23/15  Yes Chesley Mires, MD  amLODipine (NORVASC) 5 MG tablet Take 0.5 tablets (  2.5 mg total) by mouth daily. 08/10/15  Yes Larey Dresser, MD  budesonide-formoterol [facility]) 160-4.5 MCG/ACT inhaler Inhale 2 puffs into the lungs 2 (two) times daily. 2 puffs twice daily 11/13/15  Yes Chesley Mires, MD  glimepiride (AMARYL) 2 MG tablet Take 4 mg by mouth daily.  06/23/15  Yes Historical Provider, MD  HYDROcodone-acetaminophen (VICODIN) 5-500 MG per tablet Take 2 tablets by mouth every 6 (six)  hours as needed for pain.    Yes Historical Provider, MD  ipratropium-albuterol (DUONEB) 0.5-2.5 (3) MG/3ML SOLN Take 3 mLs by nebulization every 6 (six) hours as needed. 03/31/16  Yes Magdalen Spatz, NP  metFORMIN (GLUCOPHAGE) 500 MG tablet Take 1,000 mg by mouth 2 (two) times daily with a meal.  07/20/11  Yes Historical Provider, MD  metolazone (ZAROXOLYN) 2.5 MG tablet Metolazone 2.5 mg once every Monday Wednesday & Friday mornings 04/18/16  Yes Larey Dresser, MD  metoprolol succinate (TOPROL-XL) 25 MG 24 hr tablet Take 1/2 tab by mouth two times a day   Yes Historical Provider, MD  nitroGLYCERIN (NITROSTAT) 0.4 MG SL tablet Place 1 tablet (0.4 mg total) under the tongue every 5 (five) minutes as needed for chest pain. 08/03/11  Yes Liliane Shi, PA-C  OXYGEN Inhale 3 L/min into the lungs.   Yes Historical Provider, MD  potassium chloride (K-DUR) 10 MEQ tablet Monday Wednesday Friday Take 40 meq(4 tabs) in am and 20 meq(2 tabs) in pm; Saturday Sunday Tuesday Thursday Take 20 meq(2 tabs) twice daily 04/18/16  Yes Larey Dresser, MD  simvastatin (ZOCOR) 20 MG tablet Take 20 mg by mouth at bedtime.     Yes Historical Provider, MD  tiotropium (SPIRIVA) 18 MCG inhalation capsule Place 1 capsule (18 mcg total) into inhaler and inhale daily. 11/13/15  Yes Chesley Mires, MD  torsemide (DEMADEX) 20 MG tablet Take 3 tablets (60 mg total) by mouth 2 (two) times daily. 06/21/16  Yes Larey Dresser, MD  traZODone (DESYREL) 50 MG tablet Take 0.5 tablets (25 mg total) by mouth at bedtime as needed for sleep. 06/21/16  Yes Larey Dresser, MD  warfarin (COUMADIN) 3 MG tablet Take 3-6 mg by mouth as directed. 2 tabs by mouth on sat, sun,  1.5 tabs wed, all other days 1 tablet   Yes Historical Provider, MD  silver sulfADIAZINE (SILVADENE) 1 % cream Apply 1 application topically daily. Patient not taking: Reported on 09/14/2016 06/15/15   Trula Slade, DPM    Physical Exam: Vitals:   09/14/16 2000 09/14/16 2015  09/14/16 2045 09/14/16 2100  BP: 124/71 112/63 115/69 112/72  Pulse: (!) 49 92 90 98  Resp: 11 (!) 27 22 18   Temp:      TempSrc:      SpO2:  94% 91% (!) 85%   General: Not in acute distress HEENT:       Eyes: PERRL, EOMI, no scleral icterus.       ENT: No discharge from the ears and nose, no pharynx injection, no tonsillar enlargement.        Neck: No JVD, no bruit, no mass felt. Heme: No neck lymph node enlargement. Cardiac: S1/S2, RRR, No murmurs, No gallops or rubs. Respiratory: Has decreased air movement bilaterally (right is worse than the left), has mild wheezing on the right side. No rales or rubs. GI: Soft, nondistended, nontender, no rebound pain, no organomegaly, BS present. GU: No hematuria Ext: has trace leg edema bilaterally. 2+DP/PT pulse bilaterally. Musculoskeletal: has left  knee swelling and tenderness, no redness or warmth. Skin: No rashes.  Neuro: Alert, oriented X3, cranial nerves II-XII grossly intact, moves all extremities normally.  Psych: Patient is not psychotic, no suicidal or hemocidal ideation.  Labs on Admission: I have personally reviewed following labs and imaging studies  CBC:  Recent Labs Lab 09/14/16 1857  WBC 33.4*  NEUTROABS 16.4*  HGB 15.6  HCT 45.1  MCV 89.8  PLT 123456*   Basic Metabolic Panel:  Recent Labs Lab 09/14/16 1857  NA 134*  K 3.1*  CL 82*  CO2 32  GLUCOSE 192*  BUN 101*  CREATININE 2.49*  CALCIUM 9.2   GFR: CrCl cannot be calculated (Unknown ideal weight.). Liver Function Tests: No results for input(s): AST, ALT, ALKPHOS, BILITOT, PROT, ALBUMIN in the last 168 hours. No results for input(s): LIPASE, AMYLASE in the last 168 hours. No results for input(s): AMMONIA in the last 168 hours. Coagulation Profile:  Recent Labs Lab 09/14/16 1857  INR 2.90   Cardiac Enzymes:  Recent Labs Lab 09/14/16 1857  TROPONINI 0.03*   BNP (last 3 results) No results for input(s): PROBNP in the last 8760  hours. HbA1C: No results for input(s): HGBA1C in the last 72 hours. CBG: No results for input(s): GLUCAP in the last 168 hours. Lipid Profile: No results for input(s): CHOL, HDL, LDLCALC, TRIG, CHOLHDL, LDLDIRECT in the last 72 hours. Thyroid Function Tests: No results for input(s): TSH, T4TOTAL, FREET4, T3FREE, THYROIDAB in the last 72 hours. Anemia Panel: No results for input(s): VITAMINB12, FOLATE, FERRITIN, TIBC, IRON, RETICCTPCT in the last 72 hours. Urine analysis:    Component Value Date/Time   COLORURINE YELLOW 09/18/2010 1120   APPEARANCEUR CLOUDY (A) 09/18/2010 1120   LABSPEC 1.017 09/18/2010 1120   PHURINE 5.0 09/18/2010 1120   GLUCOSEU NEGATIVE 09/18/2010 1120   HGBUR LARGE (A) 09/18/2010 1120   BILIRUBINUR NEGATIVE 09/18/2010 1120   KETONESUR NEGATIVE 09/18/2010 1120   PROTEINUR NEGATIVE 09/18/2010 1120   UROBILINOGEN 0.2 09/18/2010 1120   NITRITE POSITIVE (A) 09/18/2010 1120   LEUKOCYTESUR SMALL (A) 09/18/2010 1120   Sepsis Labs: @LABRCNTIP (procalcitonin:4,lacticidven:4) )No results found for this or any previous visit (from the past 240 hour(s)).   Radiological Exams on Admission: Dg Chest 2 View  Result Date: 09/14/2016 CLINICAL DATA:  82 year old male with shortness breath and chest pain. EXAM: CHEST  2 VIEW COMPARISON:  Chest x-ray a 05/30/2016. FINDINGS: Lung volumes are low. Persistent elevation of the right hemidiaphragm is unchanged. No acute consolidative airspace disease. No pleural effusions. No evidence of pulmonary edema. Heart size appears borderline enlarged. The patient is rotated to the right on today's exam, resulting in distortion of the mediastinal contours and reduced diagnostic sensitivity and specificity for mediastinal pathology. IMPRESSION: 1. Low lung volumes without radiographic evidence of acute cardiopulmonary disease. Electronically Signed   By: Vinnie Langton M.D.   On: 09/14/2016 15:09   Dg Knee 1-2 Views Left  Result Date:  09/14/2016 CLINICAL DATA:  Chronic pain and swelling.  No injury. EXAM: LEFT KNEE - 1-2 VIEW COMPARISON:  None. FINDINGS: No acute fracture deformity or dislocation. Severe medial compartment narrowing with periarticular sclerosis and marginal spurring. Mild patellofemoral and lateral compartment marginal spurring. No destructive bony lesions. Moderate suprapatellar joint effusion. Mild vascular calcifications. Scattered popliteal phleboliths. IMPRESSION: Severe medial compartment osteoarthrosis. Moderate suprapatellar joint effusion. Electronically Signed   By: Elon Alas M.D.   On: 09/14/2016 15:07     EKG: Independently reviewed. Atrial fibrillation, QTC 495, LAD  Assessment/Plan Principal Problem:   Acute on chronic respiratory failure with hypoxia (HCC) Active Problems:   HLD (hyperlipidemia)   Essential hypertension   ATRIAL FIBRILLATION   COPD exacerbation (HCC)   Chronic diastolic CHF (congestive heart failure) (HCC)   CLL (chronic lymphocytic leukemia) (HCC)   Type II diabetes mellitus with neurological manifestations (HCC)   Elevated troponin   Hypokalemia   Acute renal failure superimposed on stage 3 chronic kidney disease (HCC)   Acute on chronic respiratory failure with hypoxia: This is likely due to progression of COPD and COPD exacerbation. Patient has severely decreased air movement bilaterally, with mild wheezing, does not have productive cough, no indication for antibiotics. Pt is on Coumadin for Afbi with therapeutic INR, less likely to have PE.  -will admit patient to SDU as inpt -start BiPAP -Nebulizers: scheduled Duoneb and prn albuterol -Solu-Medrol 60 mg IV bid  -Mucinex for cough  -Urine S. pneumococcal antigen -Follow up sputum culture, Flu pcr - May need to get pulm consult in AM  Chronic diastolic CHF: 2-D echo on 07/30/15 showed EF for 60-65 %. Patient is on Demadex and metolazone. He has trace leg edema, no JVD. Does not seem to have CHF  exacerbation. -will hold Demadex and metolazone tonight due to worsening renal function-->will start Demadex in morning -Continue metoprolol  DM-II: Last A1c 6.5 on 10/28/08, well controled. Patient is taking metformin and Amaryl at home -SSI -Check A1c  HLD: Last LDL was 39 on 11/02/11 -Continue home medications: Zocor -Check FLP  HTN: -continue amlodipine, metoprolol  Atrial Fibrillation: CHA2DS2-VASc Score is 7, needs oral anticoagulation. Patient is on Coumadin at home. INR is 2.90 on admission. Heart rate is controlled. -will continue metoprolol -Continue Coumadin per pharmacy dosing  Hypokalemia: K= 3.1 on admission. - Repleted - Check Mg level  AoCKD-III: Baseline Cre is 1.6-1.8, pt's Cre is 2.49 on admission. Likely due to prerenal secondary to dehydration and continuation ofdiruetics - Check FeUrea - Follow up renal function by BMP - Hold Diuretics  CLL (chronic lymphocytic leukemia) ([place]): has been followed up by Dr. Marin Olp. No on any medications currently. WBC 33.4 which was 22.8 on 04/20/16 -f/u with Dr. Marin Olp  Elevated troponin: trop 0.03. No CP. Most likely due to demand ischemia in the setting of worsening renal function. -Troponin 3 -Check A1c, FLP -Continue when necessary NTG, Zocor, metoprolol   DVT ppx: On coumadin Code Status: Full code Family Communication: Yes, patient's  Wife, son and daughter at bed side Disposition Plan:  Anticipate discharge back to previous home environment Consults called:  none Admission status: Inpatient/tele   Date of Service 09/14/2016    Mykira Hofmeister, [facility] Pager [phone]  If 7PM-7AM, please contact night-coverage [email or web address] Password [facility] 09/14/2016, 10:53 PM

## 2016-09-14 NOTE — ED Triage Notes (Signed)
Pt sts increased SOB and low O2 sats from PCP office x 2 days

## 2016-09-14 NOTE — Progress Notes (Signed)
ANTICOAGULATION CONSULT NOTE - Initial Consult  Pharmacy Consult for warfarin Indication: atrial fibrillation  Allergies  Allergen Reactions  . Quinine     Seizure     Patient Measurements:    Vital Signs: Temp: 98 F (36.7 C) (12/27 1732) Temp Source: Oral (12/27 1732) BP: 112/72 (12/27 2100) Pulse Rate: 98 (12/27 2100)  Labs:  Recent Labs  09/14/16 1857  HGB 15.6  HCT 45.1  PLT 146*  LABPROT 30.9*  INR 2.90  CREATININE 2.49*  TROPONINI 0.03*    CrCl cannot be calculated (Unknown ideal weight.).   Medical History: Past Medical History:  Diagnosis Date  . Atrial fibrillation ([place])   . Cerebrovascular disease   . CHF (congestive heart failure) ([place])   . CLL (chronic lymphocytic leukemia) ([place]) 11/17/2014  . COPD (chronic obstructive pulmonary disease) ([place])   . History of thrombocytopenia   . Obesity   . Osteoarthritis   . Other and unspecified hyperlipidemia   . Type II or unspecified type diabetes mellitus without mention of complication, not stated as uncontrolled   . Unspecified essential hypertension     Assessment: 86 yom presented to the ED with SOB. He is on chronic coumadin for history of afib which will be continued inpatient. INR is therapeutic and patient has already taken his dose today. H/H is WNL and platelets are slightly low.   Goal of Therapy:  INR 2-3 Monitor platelets by anticoagulation protocol: Yes   Plan:  No further warfarin today Daily INR  Muslima Toppins, Rande Lawman 09/14/2016,10:37 PM

## 2016-09-15 ENCOUNTER — Encounter: Payer: Self-pay | Admitting: Hematology & Oncology

## 2016-09-15 DIAGNOSIS — N179 Acute kidney failure, unspecified: Secondary | ICD-10-CM

## 2016-09-15 DIAGNOSIS — R748 Abnormal levels of other serum enzymes: Secondary | ICD-10-CM

## 2016-09-15 DIAGNOSIS — N183 Chronic kidney disease, stage 3 (moderate): Secondary | ICD-10-CM

## 2016-09-15 LAB — LIPID PANEL
CHOLESTEROL: 80 mg/dL (ref 0–200)
HDL: 23 mg/dL — ABNORMAL LOW (ref >40)
LDL CALC: 40 mg/dL (ref 0–99)
TRIGLYCERIDES: 86 mg/dL (ref <150)
Total CHOL/HDL Ratio: 3.5 RATIO
VLDL: 17 mg/dL (ref 0–40)

## 2016-09-15 LAB — MRSA PCR SCREENING: MRSA by PCR: NEGATIVE

## 2016-09-15 LAB — PROTIME-INR
INR: 2.51
Prothrombin Time: 27.5 seconds — ABNORMAL HIGH (ref 11.4–15.2)

## 2016-09-15 LAB — PATHOLOGIST SMEAR REVIEW

## 2016-09-15 LAB — CBG MONITORING, ED
GLUCOSE-CAPILLARY: 322 mg/dL — AB (ref 65–99)
Glucose-Capillary: 197 mg/dL — ABNORMAL HIGH (ref 65–99)
Glucose-Capillary: 276 mg/dL — ABNORMAL HIGH (ref 65–99)

## 2016-09-15 LAB — INFLUENZA PANEL BY PCR (TYPE A & B)
INFLBPCR: NEGATIVE
Influenza A By PCR: NEGATIVE

## 2016-09-15 LAB — TROPONIN I
TROPONIN I: 0.04 ng/mL — AB (ref <0.03)
Troponin I: 0.03 ng/mL (ref <0.03)

## 2016-09-15 LAB — CREATININE, URINE, RANDOM: CREATININE, URINE: 69.19 mg/dL

## 2016-09-15 LAB — GLUCOSE, CAPILLARY: GLUCOSE-CAPILLARY: 266 mg/dL — AB (ref 65–99)

## 2016-09-15 LAB — STREP PNEUMONIAE URINARY ANTIGEN: Strep Pneumo Urinary Antigen: NEGATIVE

## 2016-09-15 MED ORDER — INSULIN ASPART 100 UNIT/ML ~~LOC~~ SOLN
0.0000 [IU] | Freq: Three times a day (TID) | SUBCUTANEOUS | Status: DC
  Administered 2016-09-15: 8 [IU] via SUBCUTANEOUS
  Administered 2016-09-16: 11 [IU] via SUBCUTANEOUS
  Administered 2016-09-16: 5 [IU] via SUBCUTANEOUS
  Administered 2016-09-16 – 2016-09-17 (×3): 8 [IU] via SUBCUTANEOUS
  Administered 2016-09-17 – 2016-09-18 (×2): 5 [IU] via SUBCUTANEOUS
  Administered 2016-09-18: 11 [IU] via SUBCUTANEOUS
  Administered 2016-09-19: 3 [IU] via SUBCUTANEOUS
  Administered 2016-09-19 – 2016-09-20 (×3): 5 [IU] via SUBCUTANEOUS
  Administered 2016-09-20: 2 [IU] via SUBCUTANEOUS
  Administered 2016-09-21 (×2): 3 [IU] via SUBCUTANEOUS
  Administered 2016-09-21: 8 [IU] via SUBCUTANEOUS
  Administered 2016-09-22: 5 [IU] via SUBCUTANEOUS
  Administered 2016-09-22: 3 [IU] via SUBCUTANEOUS

## 2016-09-15 MED ORDER — IPRATROPIUM-ALBUTEROL 0.5-2.5 (3) MG/3ML IN SOLN
3.0000 mL | Freq: Four times a day (QID) | RESPIRATORY_TRACT | Status: DC
  Administered 2016-09-16 – 2016-09-18 (×11): 3 mL via RESPIRATORY_TRACT
  Filled 2016-09-15 (×11): qty 3

## 2016-09-15 MED ORDER — INSULIN ASPART 100 UNIT/ML ~~LOC~~ SOLN
0.0000 [IU] | Freq: Every day | SUBCUTANEOUS | Status: DC
  Administered 2016-09-16: 3 [IU] via SUBCUTANEOUS
  Administered 2016-09-17: 5 [IU] via SUBCUTANEOUS
  Administered 2016-09-18: 4 [IU] via SUBCUTANEOUS
  Administered 2016-09-22: 2 [IU] via SUBCUTANEOUS

## 2016-09-15 MED ORDER — WARFARIN SODIUM 3 MG PO TABS
3.0000 mg | ORAL_TABLET | Freq: Once | ORAL | Status: AC
  Administered 2016-09-15: 3 mg via ORAL
  Filled 2016-09-15 (×2): qty 1

## 2016-09-15 MED ORDER — INSULIN GLARGINE 100 UNIT/ML ~~LOC~~ SOLN
10.0000 [IU] | Freq: Every day | SUBCUTANEOUS | Status: DC
  Administered 2016-09-15 – 2016-09-16 (×2): 10 [IU] via SUBCUTANEOUS
  Filled 2016-09-15 (×3): qty 0.1

## 2016-09-15 MED ORDER — ENSURE ENLIVE PO LIQD: 237.0000 mL | Freq: Two times a day (BID) | ORAL | Status: DC

## 2016-09-15 NOTE — ED Notes (Signed)
Pt given urinal.

## 2016-09-15 NOTE — ED Notes (Signed)
Patient attempting to to get out of bed.  Encouraged patient to remain in bed, replaced O2 mask and provided pillows and reassurance.  Patient resting comfortably now.

## 2016-09-15 NOTE — Progress Notes (Signed)
PROGRESS NOTE  William OBERHELMAN  [id number] DOB: 1933-06-28  DOA: 09/14/2016 PCP: Ileana Roup, MD   Brief Narrative:  82 year old male with PMH of chronic hypoxic respiratory failure on home oxygen 3 L/m continuously, OSA on nightly CPAP, HTN, HLD, DM, CLL, chronic diastolic CHF, atrial fibrillation on Coumadin, stroke, stage III chronic kidney disease, presented to ED with 2 days history of worsening dyspnea, minimal dry cough but no chest pain, fever or chills. In the ED, found to be hypoxic at 83% on 2 L/m oxygen, briefly on BiPAP in ED. Admitted to stepdown for possible COPD exacerbation. Improving.   Assessment & Plan:   Principal Problem:   Acute on chronic respiratory failure with hypoxia (HCC) Active Problems:   HLD (hyperlipidemia)   Essential hypertension   ATRIAL FIBRILLATION   COPD exacerbation (HCC)   Chronic diastolic CHF (congestive heart failure) (HCC)   CLL (chronic lymphocytic leukemia) (HCC)   Type II diabetes mellitus with neurological manifestations (HCC)   Elevated troponin   Hypokalemia   Acute renal failure superimposed on stage 3 chronic kidney disease ([place])   1. COPD exacerbation: Briefly required BiPAP in ED. Now off of BiPAP. Treated with oxygen, bronchodilator nebulizations, IV Solu-Medrol. No antibiotics initiated due to nonproductive minimal cough. Influenza panel PCR and A&B: Negative. Urinary streptococcal antigen: Negative. Former smoker. Improving. 2. Acute on chronic respiratory failure with hypoxia: On home oxygen 3 L/m. Hypoxic in ED at 83% on 2 L. Briefly required BiPAP. Now off of BiPAP. Management as per problem #1. Monitor closely. Low index of suspicion for PE while therapeutic on Coumadin and improvement with above measures. 3. Chronic diastolic CHF: LVEF 123456 by echo 07/30/15. Appears compensated. Continue Demadex but hold metolazone for another day. Continue metoprolol. 4. Type II DM: A1c 6.5 on 11/07/08. Holding by mouth meds.  Continue SSI. Add low-dose Lantus due to worsening hyperglycemia while on steroids. Taper steroids as soon as possible. Repeat A1c. 5. HLD: Continue Zocor. 6. HTN: Continue amlodipine and metoprolol. Controlled. 7. Atrial fibrillation: CHA2DS2-VASc Score is 7. INR therapeutic. Continue Coumadin per pharmacy. Continue metoprolol. 8. Hypokalemia: Replaced. Follow BMP in a.m. Magnesium 2.3. 9. Acute on stage III chronic kidney disease: Baseline creatinine 1.6-1.8. Admitting creatinine 2.49. Likely due to diuretics and dehydration. Temporarily held diuretics. Follow BMP in a.m. 10. CLL: Follows with outpatient oncology. Not on any medications. 11. Elevated troponin: Flat trend. No chest pain. Likely demand ischemia from acute respiratory issues and renal insufficiency. Continue when necessary NTG, statins and metoprolol. 12. Left knee pain: No acute findings on the exam. X-ray suggestive of osteoarthritis. Supportive treatment.   DVT prophylaxis: Anticoagulated on Coumadin Code Status: Full Family Communication: None at bedside Disposition Plan: Admitted to step down. DC home when medically stable.   Consultants:   None  Procedures:   BiPAP  Antimicrobials:   None    Subjective: Feels better than he did on initial arrival to the ED. Seen in the ED this morning. Dyspnea improved. No chest pain reported.  Objective:  Vitals:   09/15/16 1500 09/15/16 1530 09/15/16 1600 09/15/16 1654  BP: 119/76 126/86 114/74   Pulse: 92 98 100   Resp: 24 26 (!) 27   Temp:    97.4 F (36.3 C)  TempSrc:    Oral  SpO2: 90% 91% 94%   Weight:    124.8 kg (275 lb 2.2 oz)  Height:    6\' 3"  (1.905 m)   No intake or output data in the  24 hours ending 09/15/16 1657 Filed Weights   09/15/16 1654  Weight: 124.8 kg (275 lb 2.2 oz)    Examination:  General exam: Pleasant elderly male, moderately built and obese, lying comfortably propped up in the gurney this morning. Respiratory system: Harsh  breath sounds bilaterally with occasional expiratory rhonchi. Respiratory effort normal. Cardiovascular system: S1 & S2 heard, RRR. No JVD, murmurs, rubs, gallops or clicks. No pedal edema. Gastrointestinal system: Abdomen is nondistended, soft and nontender. No organomegaly or masses felt. Normal bowel sounds heard. Central nervous system: Alert and oriented. No focal neurological deficits. Extremities: Symmetric 5 x 5 power. Skin: No rashes, lesions or ulcers Psychiatry: Judgement and insight appear normal. Mood & affect appropriate.     Data Reviewed: I have personally reviewed following labs and imaging studies  CBC:  Recent Labs Lab 09/14/16 1857  WBC 33.4*  NEUTROABS 16.4*  HGB 15.6  HCT 45.1  MCV 89.8  PLT 123456*   Basic Metabolic Panel:  Recent Labs Lab 09/14/16 1857 09/14/16 2250  NA 134*  --   K 3.1*  --   CL 82*  --   CO2 32  --   GLUCOSE 192*  --   BUN 101*  --   CREATININE 2.49*  --   CALCIUM 9.2  --   MG  --  2.3   GFR: Estimated Creatinine Clearance: 33.1 mL/min (by C-G formula based on SCr of 2.49 mg/dL (H)). Liver Function Tests: No results for input(s): AST, ALT, ALKPHOS, BILITOT, PROT, ALBUMIN in the last 168 hours. No results for input(s): LIPASE, AMYLASE in the last 168 hours. No results for input(s): AMMONIA in the last 168 hours. Coagulation Profile:  Recent Labs Lab 09/14/16 1857 09/15/16 0434  INR 2.90 2.51   Cardiac Enzymes:  Recent Labs Lab 09/14/16 1857 09/14/16 2250 09/15/16 0434 09/15/16 1029  TROPONINI 0.03* 0.06* 0.04* 0.03*   BNP (last 3 results) No results for input(s): PROBNP in the last 8760 hours. HbA1C: No results for input(s): HGBA1C in the last 72 hours. CBG:  Recent Labs Lab 09/15/16 0041 09/15/16 0713 09/15/16 1210  GLUCAP 197* 276* 322*   Lipid Profile:  Recent Labs  09/15/16 0434  CHOL 80  HDL 23*  LDLCALC 40  TRIG 86  CHOLHDL 3.5   Thyroid Function Tests: No results for input(s): TSH,  T4TOTAL, FREET4, T3FREE, THYROIDAB in the last 72 hours. Anemia Panel: No results for input(s): VITAMINB12, FOLATE, FERRITIN, TIBC, IRON, RETICCTPCT in the last 72 hours.  Sepsis Labs: No results for input(s): PROCALCITON, LATICACIDVEN in the last 168 hours.  No results found for this or any previous visit (from the past 240 hour(s)).       Radiology Studies: Dg Chest 2 View  Result Date: 09/14/2016 CLINICAL DATA:  82 year old male with shortness breath and chest pain. EXAM: CHEST  2 VIEW COMPARISON:  Chest x-ray a 05/30/2016. FINDINGS: Lung volumes are low. Persistent elevation of the right hemidiaphragm is unchanged. No acute consolidative airspace disease. No pleural effusions. No evidence of pulmonary edema. Heart size appears borderline enlarged. The patient is rotated to the right on today's exam, resulting in distortion of the mediastinal contours and reduced diagnostic sensitivity and specificity for mediastinal pathology. IMPRESSION: 1. Low lung volumes without radiographic evidence of acute cardiopulmonary disease. Electronically Signed   By: Vinnie Langton M.D.   On: 09/14/2016 15:09   Dg Knee 1-2 Views Left  Result Date: 09/14/2016 CLINICAL DATA:  Chronic pain and swelling.  No injury. EXAM:  LEFT KNEE - 1-2 VIEW COMPARISON:  None. FINDINGS: No acute fracture deformity or dislocation. Severe medial compartment narrowing with periarticular sclerosis and marginal spurring. Mild patellofemoral and lateral compartment marginal spurring. No destructive bony lesions. Moderate suprapatellar joint effusion. Mild vascular calcifications. Scattered popliteal phleboliths. IMPRESSION: Severe medial compartment osteoarthrosis. Moderate suprapatellar joint effusion. Electronically Signed   By: Elon Alas M.D.   On: 09/14/2016 15:07        Scheduled Meds: . amLODipine  2.5 mg Oral Daily  . dextromethorphan-guaiFENesin  1 tablet Oral BID  . insulin aspart  0-5 Units Subcutaneous  QHS  . insulin aspart  0-9 Units Subcutaneous TID WC  . ipratropium-albuterol  3 mL Nebulization Q4H  . methylPREDNISolone (SOLU-MEDROL) injection  60 mg Intravenous Q12H  . metoprolol succinate  12.5 mg Oral Daily  . mometasone-formoterol  2 puff Inhalation BID  . simvastatin  20 mg Oral QHS  . torsemide  60 mg Oral BID  . warfarin  3 mg Oral ONCE-1800  . Warfarin - Pharmacist Dosing Inpatient   Does not apply q1800   Continuous Infusions:   LOS: 1 day      [facility], MD [facility] Pager [phone]  If 7PM-7AM, please contact night-coverage [email or web address] Password [facility] 09/15/2016, 4:57 PM

## 2016-09-15 NOTE — ED Notes (Signed)
Assisted pt out of the restroom pt extremely SOB advised pt to use the restroom in the room due to pt becoming SOB RN aware

## 2016-09-15 NOTE — ED Notes (Signed)
Pt requested to take Bi pap off and to have his 5L of O2 put back on.

## 2016-09-15 NOTE — Progress Notes (Signed)
RT entered room to find patient has taken his CPAP mask off. Pt states he cannot wear it but has called family to bring his from home. RT has offered Bipap due to patients shortness of breath and increased work of breathing and patient refuses to allow RT to try this option or put CPAP back on. RT placing pt on venti mask as SATS are 87% on 4L [place]. RT will continue to monitor.

## 2016-09-15 NOTE — ED Notes (Signed)
William Braun son [id number]

## 2016-09-15 NOTE — Progress Notes (Signed)
ANTICOAGULATION CONSULT NOTE - Initial Consult  Pharmacy Consult for warfarin Indication: atrial fibrillation  Allergies  Allergen Reactions  . Quinine     Seizure     Patient Measurements:    Vital Signs: BP: 125/112 (12/28 0807) Pulse Rate: 101 (12/28 0807)  Labs:  Recent Labs  09/14/16 1857 09/14/16 2250 09/15/16 0434  HGB 15.6  --   --   HCT 45.1  --   --   PLT 146*  --   --   LABPROT 30.9*  --  27.5*  INR 2.90  --  2.51  CREATININE 2.49*  --   --   TROPONINI 0.03* 0.06* 0.04*    CrCl cannot be calculated (Unknown ideal weight.).   Medical History: Past Medical History:  Diagnosis Date  . Atrial fibrillation ([place])   . Cerebrovascular disease   . CHF (congestive heart failure) ([place])   . CLL (chronic lymphocytic leukemia) ([place]) 11/17/2014  . COPD (chronic obstructive pulmonary disease) ([place])   . History of thrombocytopenia   . Obesity   . Osteoarthritis   . Other and unspecified hyperlipidemia   . Type II or unspecified type diabetes mellitus without mention of complication, not stated as uncontrolled   . Unspecified essential hypertension     Assessment: 54 yom presented to the ED with SOB. He is on chronic coumadin for history of afib which will be continued inpatient. INR was therapeutic on admission.  PTA Dose: 6mg  Sat/Sun, 4.5mg  Wed, 3mg  Mon/Tues/Thurs/Fri  INR remains therapeutic today at 2.51. CBC from last night showed hemoglobin within normal limits and a platelet count slightly low at 146k/uL. No bleeding noted.   Goal of Therapy:  INR 2-3 Monitor platelets by anticoagulation protocol: Yes   Plan:  Warfarin 3mg  PO x1 tonight Daily INR Monitor CBC and signs/symptoms of bleeding   Demetrius Charity, PharmD Acute Care Pharmacy Resident  Pager: [phone] 09/15/2016

## 2016-09-15 NOTE — ED Notes (Signed)
Per admitting physician pt's o2 titrated from 5L to 4L. Goal to be at 3L [place] with sats 88%-92%.

## 2016-09-15 NOTE — Care Management Note (Signed)
Case Management Note  Patient Details  Name: William Braun MRN: [id number] Date of Birth: 01/26/1933  Subjective/Objective:                  From home with spouse. /83 y.o. male with medical history significant of hypertension, hyperlipidemia, diabetes mellitus, COPD on 2 L oxygen at home, CLL, dCHF, atrial fibrillation on Coumadin, stroke, CKD-III, who presents with worsening shortness of breath.  Action/Plan: Follow for disposition needs./ Admit to INPATIENT. COPD protocol.     Expected Discharge Date:  09/18/16               Expected Discharge Plan:  [place]  In-House Referral:  NA  Discharge planning Services  CM Consult   Status of Service:  In process, will continue to follow  If discussed at Long Length of Stay Meetings, dates discussed:    Additional Comments:  Fuller Mandril, RN 09/15/2016, 10:01 AM

## 2016-09-15 NOTE — ED Notes (Signed)
Ordered heart healthy/carb mod diet for pt 

## 2016-09-15 NOTE — ED Notes (Signed)
Attempted report x1. 

## 2016-09-15 NOTE — Progress Notes (Signed)
Attempted to call and obtain report but told nurse was off the floor. Left my name and number.

## 2016-09-15 NOTE — ED Notes (Signed)
Report given to [facility]

## 2016-09-15 NOTE — ED Notes (Signed)
Pt did not tolerate 4L  well and became short of breath breathing 30 times per minute and o2 saturation was 86%. Pt placed on Venti mask by RT with improvement. Pt is breathing easily at this time. o2 saturation 93% respirations 25.

## 2016-09-15 NOTE — ED Notes (Signed)
Pt eating lunch tray  

## 2016-09-15 NOTE — ED Notes (Signed)
Family at bedside. 

## 2016-09-16 ENCOUNTER — Ambulatory Visit: Payer: PPO | Admitting: Podiatry

## 2016-09-16 DIAGNOSIS — I480 Paroxysmal atrial fibrillation: Secondary | ICD-10-CM

## 2016-09-16 LAB — HEMOGLOBIN A1C
HEMOGLOBIN A1C: 8.1 % — AB (ref 4.8–5.6)
HEMOGLOBIN A1C: 8.1 % — AB (ref 4.8–5.6)
MEAN PLASMA GLUCOSE: 186 mg/dL
Mean Plasma Glucose: 186 mg/dL

## 2016-09-16 LAB — PROTIME-INR
INR: 2.54
PROTHROMBIN TIME: 27.9 s — AB (ref 11.4–15.2)

## 2016-09-16 LAB — CBC
HCT: 45.3 % (ref 39.0–52.0)
Hemoglobin: 15.4 g/dL (ref 13.0–17.0)
MCH: 30.6 pg (ref 26.0–34.0)
MCHC: 34 g/dL (ref 30.0–36.0)
MCV: 90.1 fL (ref 78.0–100.0)
Platelets: 179 10*3/uL (ref 150–400)
RBC: 5.03 MIL/uL (ref 4.22–5.81)
RDW: 16.3 % — ABNORMAL HIGH (ref 11.5–15.5)
WBC: 38.2 10*3/uL — ABNORMAL HIGH (ref 4.0–10.5)

## 2016-09-16 LAB — BASIC METABOLIC PANEL
ANION GAP: 17 — AB (ref 5–15)
BUN: 118 mg/dL — ABNORMAL HIGH (ref 6–20)
CALCIUM: 9.4 mg/dL (ref 8.9–10.3)
CHLORIDE: 80 mmol/L — AB (ref 101–111)
CO2: 39 mmol/L — AB (ref 22–32)
Creatinine, Ser: 2.43 mg/dL — ABNORMAL HIGH (ref 0.61–1.24)
GFR calc non Af Amer: 23 mL/min — ABNORMAL LOW (ref >60)
GFR, EST AFRICAN AMERICAN: 27 mL/min — AB (ref >60)
Glucose, Bld: 243 mg/dL — ABNORMAL HIGH (ref 65–99)
Potassium: 2.9 mmol/L — ABNORMAL LOW (ref 3.5–5.1)
Sodium: 136 mmol/L (ref 135–145)

## 2016-09-16 LAB — GLUCOSE, CAPILLARY
GLUCOSE-CAPILLARY: 240 mg/dL — AB (ref 65–99)
GLUCOSE-CAPILLARY: 320 mg/dL — AB (ref 65–99)
Glucose-Capillary: 278 mg/dL — ABNORMAL HIGH (ref 65–99)
Glucose-Capillary: 293 mg/dL — ABNORMAL HIGH (ref 65–99)

## 2016-09-16 LAB — UREA NITROGEN, URINE: Urea Nitrogen, Ur: 486 mg/dL

## 2016-09-16 MED ORDER — GLUCERNA SHAKE PO LIQD
237.0000 mL | Freq: Three times a day (TID) | ORAL | Status: DC
  Administered 2016-09-16 – 2016-09-23 (×12): 237 mL via ORAL

## 2016-09-16 MED ORDER — SENNOSIDES-DOCUSATE SODIUM 8.6-50 MG PO TABS
1.0000 | ORAL_TABLET | Freq: Two times a day (BID) | ORAL | Status: DC
  Administered 2016-09-16 – 2016-09-22 (×7): 1 via ORAL
  Filled 2016-09-16 (×11): qty 1

## 2016-09-16 MED ORDER — METHYLPREDNISOLONE SODIUM SUCC 40 MG IJ SOLR
40.0000 mg | Freq: Two times a day (BID) | INTRAMUSCULAR | Status: DC
  Administered 2016-09-16: 40 mg via INTRAVENOUS
  Filled 2016-09-16: qty 1

## 2016-09-16 MED ORDER — WARFARIN SODIUM 3 MG PO TABS
3.0000 mg | ORAL_TABLET | Freq: Once | ORAL | Status: AC
  Administered 2016-09-16: 3 mg via ORAL
  Filled 2016-09-16: qty 1

## 2016-09-16 MED ORDER — POTASSIUM CHLORIDE CRYS ER 20 MEQ PO TBCR
40.0000 meq | EXTENDED_RELEASE_TABLET | ORAL | Status: AC
  Administered 2016-09-16 (×2): 40 meq via ORAL
  Filled 2016-09-16 (×2): qty 2

## 2016-09-16 NOTE — Evaluation (Signed)
Physical Therapy Evaluation Patient Details Name: William Braun MRN: [id number] DOB: 10/26/1932 Today's Date: 09/16/2016   History of Present Illness  pt presents with COPD Exacerbation.  pt with hx of COPD, Chronic Respiratory Failure, OSA, HTN, DM, CLL, CHF, A-fib, CVA, and A-fib.    Clinical Impression  Pt very pleasant and agreeable to mobility OOB.  Pt does a good job directing PT on how to set-up position of recliner to simulate his transfers at home from bed to scooter.  At home pt uses 3L O2, however currently on 7L O2 with O2 sats ranging 89 - 94% throughout mobility.  Pt with DOE, but indicates it is still better than when he presented to ED.  Feel pt should be able to return to home with wife's support.  Will continue to follow while on acute.      Follow Up Recommendations Home health PT;Supervision/Assistance - 24 hour    Equipment Recommendations  None recommended by PT    Recommendations for Other Services       Precautions / Restrictions Precautions Precautions: Fall Precaution Comments: Watch O2 sats Restrictions Weight Bearing Restrictions: No      Mobility  Bed Mobility Overal bed mobility: Needs Assistance Bed Mobility: Supine to Sit     Supine to sit: Supervision;HOB elevated     General bed mobility comments: pt needs increased time and heavy use of UEs, but no physical A needed.    Transfers Overall transfer level: Needs assistance Equipment used: None Transfers: Squat Pivot Transfers     Squat pivot transfers: Min guard     General transfer comment: pt good at directing where pt needs recliner placed to simulate his home set-up for transfer to scooter.  pt remains squat and leans anteriorly on UEs utilizing armrests on chair and bed rail to pivot towards his L side.  No physical A needed only set-up and guarding.    Ambulation/Gait                Stairs            Wheelchair Mobility    Modified Rankin (Stroke Patients Only)       Balance Overall balance assessment: Needs assistance Sitting-balance support: No upper extremity supported;Feet supported Sitting balance-Leahy Scale: Good     Standing balance support: Bilateral upper extremity supported;During functional activity Standing balance-Leahy Scale: Poor                               Pertinent Vitals/Pain Pain Assessment: No/denies pain    Home Living Family/patient expects to be discharged to:: Private residence Living Arrangements: Spouse/significant other Available Help at Discharge: Family;Available 24 hours/day Type of Home: House Home Access: Ramped entrance     Home Layout: One level Home Equipment: Transport planner      Prior Function Level of Independence: Needs assistance   Gait / Transfers Assistance Needed: pt transfer Mod I to/from scooter only.  No Ambulation.    ADL's / Homemaking Assistance Needed: PT able to perform grooming tasks with only set-up.  Needs A for hard to reach bathing and dressing.  Wife performs home making tasks.          Hand Dominance        Extremity/Trunk Assessment   Upper Extremity Assessment Upper Extremity Assessment: Defer to OT evaluation    Lower Extremity Assessment Lower Extremity Assessment: Generalized weakness (L LE sensitive per pt report.  )  Cervical / Trunk Assessment Cervical / Trunk Assessment: Kyphotic  Communication   Communication: No difficulties  Cognition Arousal/Alertness: Awake/alert Behavior During Therapy: WFL for tasks assessed/performed Overall Cognitive Status: Within Functional Limits for tasks assessed                      General Comments      Exercises     Assessment/Plan    PT Assessment Patient needs continued PT services  PT Problem List Decreased strength;Decreased activity tolerance;Decreased mobility;Decreased balance;Decreased coordination;Decreased knowledge of use of DME;Cardiopulmonary status limiting  activity;Obesity          PT Treatment Interventions DME instruction;Functional mobility training;Therapeutic activities;Therapeutic exercise;Balance training;Patient/family education    PT Goals (Current goals can be found in the Care Plan section)  Acute Rehab PT Goals Patient Stated Goal: Get home with wife PT Goal Formulation: With patient Time For Goal Achievement: 09/23/16 Potential to Achieve Goals: Good    Frequency Min 3X/week   Barriers to discharge        Co-evaluation               End of Session Equipment Utilized During Treatment: Gait belt;Oxygen Activity Tolerance: Patient limited by fatigue Patient left: in chair;with call bell/phone within reach;with nursing/sitter in room Nurse Communication: Mobility status         Time: [id number] PT Time Calculation (min) (ACUTE ONLY): 27 min   Charges:   PT Evaluation $PT Eval Moderate Complexity: 1 Procedure PT Treatments $Therapeutic Activity: 8-22 mins   PT G CodesCatarina Hartshorn, [place]  [phone] 09/16/2016, 9:45 AM

## 2016-09-16 NOTE — Progress Notes (Signed)
William Braun for warfarin Indication: atrial fibrillation  Allergies  Allergen Reactions  . Quinine     Seizure     Patient Measurements: Height: 6\' 3"  (190.5 cm) Weight: 275 lb 2.2 oz (124.8 kg) IBW/kg (Calculated) : 84.5  Vital Signs: Temp: 97.7 F (36.5 C) (12/29 0812) Temp Source: Oral (12/29 0812) BP: 115/78 (12/29 0812) Pulse Rate: 96 (12/29 0812)  Labs:  Recent Labs  09/14/16 1857 09/14/16 2250 09/15/16 0434 09/15/16 1029 09/16/16 0442  HGB 15.6  --   --   --  15.4  HCT 45.1  --   --   --  45.3  PLT 146*  --   --   --  179  LABPROT 30.9*  --  27.5*  --  27.9*  INR 2.90  --  2.51  --  2.54  CREATININE 2.49*  --   --   --  2.43*  TROPONINI 0.03* 0.06* 0.04* 0.03*  --     Estimated Creatinine Clearance: 33.9 mL/min (by C-G formula based on SCr of 2.43 mg/dL (H)).   Medical History: Past Medical History:  Diagnosis Date  . Atrial fibrillation ([place])   . Cerebrovascular disease   . CHF (congestive heart failure) ([place])   . CLL (chronic lymphocytic leukemia) ([place]) 11/17/2014  . COPD (chronic obstructive pulmonary disease) ([place])   . History of thrombocytopenia   . Obesity   . Osteoarthritis   . Other and unspecified hyperlipidemia   . Type II or unspecified type diabetes mellitus without mention of complication, not stated as uncontrolled   . Unspecified essential hypertension     Assessment: 82 yo male admitted with SOB. He is on warfarin for history of afib which will be continued inpatient. INR was therapeutic on admission.  PTA Dose: 6mg  Sat/Sun, 4.5mg  Wed, 3mg  Mon/Tues/Thurs/Fri  INR remains therapeutic today at 2.5   Goal of Therapy:  INR 2-3 Monitor platelets by anticoagulation protocol: Yes   Plan:  -Warfarin 3 mg po x1 -Daily INR    Hughes Better, PharmD, BCPS Clinical Pharmacist 09/16/2016 11:31 AM

## 2016-09-16 NOTE — Progress Notes (Signed)
Initial Nutrition Assessment  DOCUMENTATION CODES:   Obesity unspecified  INTERVENTION:  Provide Glucerna Shake po TID, each supplement provides 220 kcal and 10 grams of protein   NUTRITION DIAGNOSIS:   Inadequate oral intake related to acute illness as evidenced by per patient/family report, energy intake < or equal to 50% for > or equal to 5 days.   GOAL:   Patient will meet greater than or equal to 90% of their needs   MONITOR:   PO intake, Supplement acceptance, Labs, Weight trends, I & O's, Skin  REASON FOR ASSESSMENT:   Malnutrition Screening Tool, Consult Assessment of nutrition requirement/status  ASSESSMENT:   82 y.o. male with medical history significant of hypertension, hyperlipidemia, diabetes mellitus, COPD on 2 L oxygen at home, CLL, dCHF, atrial fibrillation on Coumadin, stroke, CKD-III, who presents with worsening shortness of breath.  Pt states that he has had a poor appetite for the past week due to breathing difficulty and has only been eating 20% of his usual meals. He usually eats well and has a good appetite. He does not use nutritional supplements at home. He reports losing 35 lbs in the past month. Per weight history, pt has lost 13 lbs in the past 2 months- 5% wt loss not significant for time frame. Pt reports feeling better this morning and eating 100% of breakfast. Mild muscle wasting and mild fat wasting noted in nutrition-focused physical exam. Pt agreeable to receiving nutritional supplements throughout admission.  Labs: elevated glucose, low potassium, low chloride, low GFR, elevated creatinine, high BUN  Diet Order:  Diet heart healthy/carb modified Room service appropriate? Yes; Fluid consistency: Thin  Skin:  Reviewed, no issues  Last BM:  PTA  Height:   Ht Readings from Last 1 Encounters:  09/15/16 6\' 3"  (1.905 m)    Weight:   Wt Readings from Last 1 Encounters:  09/15/16 275 lb 2.2 oz (124.8 kg)    Ideal Body Weight:  89.09  kg  BMI:  Body mass index is 34.39 kg/m.  Estimated Nutritional Needs:   Kcal:  2200-2400  Protein:  130-140 grams  Fluid:  2.2-2.4 L/day  EDUCATION NEEDS:   No education needs identified at this time  [place], CSP, LDN Inpatient Clinical Dietitian Pager: [phone] After Hours Pager: [phone]

## 2016-09-16 NOTE — Evaluation (Signed)
Occupational Therapy Evaluation Patient Details Name: William Braun MRN: [id number] DOB: 12/18/1932 Today's Date: 09/16/2016    History of Present Illness pt presents with COPD Exacerbation.  pt with hx of COPD, Chronic Respiratory Failure, OSA, HTN, DM, CLL, CHF, A-fib, CVA, and A-fib.     Clinical Impression   Pt admitted with above. He demonstrates the below listed deficits and will benefit from continued OT to maximize safety and independence with BADLs.  Pt requires min - mod A for ADLs.  He primarily utilized scooter for functional mobility PTA, but did walk into bathroom.  Wife assisted him with LB ADLs and will be able to continue to assist him at discharge.  02 sats 88-93% on 7L supplemental 02.  Recommend HHOT.  Will follow acutely.       Follow Up Recommendations  Home health OT;Supervision/Assistance - 24 hour    Equipment Recommendations  None recommended by OT    Recommendations for Other Services       Precautions / Restrictions Precautions Precautions: Fall Precaution Comments: Watch O2 sats Restrictions Weight Bearing Restrictions: No      Mobility Bed Mobility Overal bed mobility: Needs Assistance Bed Mobility: Sit to Supine       Sit to supine: Min assist   General bed mobility comments: assist for LEs and to scoot to [facility]   Transfers Overall transfer level: Needs assistance Equipment used: Ambulation equipment used Transfers: Sit to/from Stand     Squat pivot transfers: Min assist     General transfer comment: Pt required min A to move all way into standing     Balance Overall balance assessment: Needs assistance Sitting-balance support: No upper extremity supported;Feet supported Sitting balance-Leahy Scale: Good     Standing balance support: Bilateral upper extremity supported;During functional activity Standing balance-Leahy Scale: Poor                              ADL Overall ADL's : Needs  assistance/impaired Eating/Feeding: Independent   Grooming: Wash/dry hands;Wash/dry face;Oral care;Brushing hair;Set up;Sitting   Upper Body Bathing: Minimal assistance;Sitting   Lower Body Bathing: Maximal assistance;Sit to/from stand   Upper Body Dressing : Minimal assistance;Sitting   Lower Body Dressing: Maximal assistance;Sit to/from stand   Toilet Transfer: Minimal assistance;Stand-pivot;BSC   Toileting- Clothing Manipulation and Hygiene: Maximal assistance;Sit to/from stand       Functional mobility during ADLs: Minimal assistance General ADL Comments: Pt fatigues quickly with 02 sats dropping to 88% on 7L supplemental 02. He was instructed in pursed lip breathing      Vision     Perception     Praxis      Pertinent Vitals/Pain Pain Assessment: No/denies pain     Hand Dominance     Extremity/Trunk Assessment Upper Extremity Assessment Upper Extremity Assessment: Generalized weakness   Lower Extremity Assessment Lower Extremity Assessment: Defer to PT evaluation   Cervical / Trunk Assessment Cervical / Trunk Assessment: Kyphotic   Communication Communication Communication: No difficulties   Cognition Arousal/Alertness: Awake/alert Behavior During Therapy: WFL for tasks assessed/performed Overall Cognitive Status: Within Functional Limits for tasks assessed                     General Comments       Exercises       Shoulder Instructions      Home Living Family/patient expects to be discharged to:: Private residence Living Arrangements: Spouse/significant other Available  Help at Discharge: Family;Available 24 hours/day Type of Home: House Home Access: Ramped entrance     Home Layout: One level     Bathroom Shower/Tub: Occupational psychologist: Handicapped height Bathroom Accessibility: No   Home Equipment: Transport planner          Prior Functioning/Environment Level of Independence: Needs assistance  Gait /  Transfers Assistance Needed: pt transfer Mod I to/from scooter only.   Bathroom is not accessible to his scooter so he parks in front of door and holds onto fixtures in the bathroom and ambulates 3-5''   ADL's / Homemaking Assistance Needed: wife assists pt with bathing and dressing feet as well as bathing his back.  She performs IADLs             OT Problem List: Decreased strength;Decreased activity tolerance;Impaired balance (sitting and/or standing);Decreased safety awareness;Decreased knowledge of use of DME or AE;Cardiopulmonary status limiting activity;Obesity   OT Treatment/Interventions: Self-care/ADL training;Therapeutic exercise;Energy conservation;DME and/or AE instruction;Therapeutic activities;Patient/family education;Balance training    OT Goals(Current goals can be found in the care plan section) Acute Rehab OT Goals Patient Stated Goal: to be able to walk into the bathroom  OT Goal Formulation: With patient Time For Goal Achievement: 09/30/16 Potential to Achieve Goals: Good ADL Goals Pt Will Perform Upper Body Bathing: with set-up;sitting Pt Will Perform Lower Body Bathing: with mod assist;sit to/from stand;with adaptive equipment Pt Will Perform Upper Body Dressing: with set-up;sitting Pt Will Perform Lower Body Dressing: with mod assist;with adaptive equipment;sit to/from stand Pt Will Transfer to Toilet: with min assist;ambulating;regular height toilet;grab bars Pt Will Perform Toileting - Clothing Manipulation and hygiene: with min assist;sit to/from stand Pt/caregiver will Perform Home Exercise Program: Increased strength;Right Upper extremity;Left upper extremity;With written HEP provided;With Supervision  OT Frequency: Min 2X/week   Barriers to D/C:            Co-evaluation              End of Session Equipment Utilized During Treatment: Oxygen Nurse Communication: Mobility status  Activity Tolerance: Patient limited by fatigue Patient left: in  bed;with call bell/phone within reach;with family/visitor present   Time: [id number] OT Time Calculation (min): 50 min Charges:  OT General Charges $OT Visit: 1 Procedure OT Evaluation $OT Eval Moderate Complexity: 1 Procedure OT Treatments $Therapeutic Activity: 23-37 mins G-Codes:    Tekila Caillouet M 10/09/2016, 6:04 PM

## 2016-09-16 NOTE — Care Management Note (Signed)
Case Management Note  Patient Details  Name: William Braun MRN: [id number] Date of Birth: 08-26-1933  Subjective/Objective:   Presents with copd ex, on 8 liters HFNC in SDU, NCM will cont to follow for dc needs.                 Action/Plan:   Expected Discharge Date:  09/18/16               Expected Discharge Plan:  [place]  In-House Referral:  NA  Discharge planning Services  CM Consult  Post Acute Care Choice:    Choice offered to:     DME Arranged:    DME Agency:     HH Arranged:    HH Agency:     Status of Service:  In process, will continue to follow  If discussed at Long Length of Stay Meetings, dates discussed:    Additional Comments:  Zenon Mayo, RN 09/16/2016, 5:43 PM

## 2016-09-16 NOTE — Progress Notes (Signed)
PROGRESS NOTE  William Braun  [id number] DOB: [birth year]  DOA: 09/14/2016 PCP: Ileana Roup, MD   Brief Narrative:  82 year old male with PMH of chronic hypoxic respiratory failure on home oxygen 3 L/m continuously, OSA on nightly CPAP, HTN, HLD, DM, CLL, chronic diastolic CHF, atrial fibrillation on Coumadin, stroke, stage III chronic kidney disease, presented to ED with 2 days history of worsening dyspnea, minimal dry cough but no chest pain, fever or chills. In the ED, found to be hypoxic at 83% on 2 L/m oxygen, briefly on BiPAP in ED. Admitted to stepdown for possible COPD exacerbation. Improving.   Assessment & Plan:   Principal Problem:   Acute on chronic respiratory failure with hypoxia (HCC) Active Problems:   HLD (hyperlipidemia)   Essential hypertension   ATRIAL FIBRILLATION   COPD exacerbation (HCC)   Chronic diastolic CHF (congestive heart failure) (HCC)   CLL (chronic lymphocytic leukemia) (HCC)   Type II diabetes mellitus with neurological manifestations (HCC)   Elevated troponin   Hypokalemia   Acute renal failure superimposed on stage 3 chronic kidney disease ([place])   1. COPD exacerbation: Briefly required BiPAP in ED. Now off of BiPAP. Treated with oxygen, bronchodilator nebulizations, IV Solu-Medrol. No antibiotics initiated due to nonproductive minimal cough. Influenza panel PCR A&B: Negative. Urinary streptococcal antigen: Negative. Former smoker. Improving. 2. Acute on chronic respiratory failure with hypoxia: On home oxygen 3 L/m. Hypoxic in ED at 83% on 2 L. Briefly required BiPAP. Now off of BiPAP. Management as per problem #1. Monitor closely. Low index of suspicion for PE while therapeutic on Coumadin and improvement with above measures. Wean oxygen to maintain saturations between 89-92 percent. Discussed with RN. Currently on high flow oxygen 8 L/m. 3. Chronic diastolic CHF: LVEF [id number] by echo 07/30/15. Appears compensated. Continue metoprolol.  Clinically not volume overloaded. Creatinine remained stable at 2.4 but higher than his baseline. Hold diuretics for today. Follow BMP in a.m. and clinically. 4. Type II DM: A1c 6.5 on 11/07/08. Holding by mouth meds. Continue SSI. Add low-dose Lantus due to worsening hyperglycemia while on steroids. Taper steroids as soon as possible. Repeat A1c: 8.1. 5. HLD: Continue Zocor. 6. HTN: Continue amlodipine and metoprolol. Controlled. 7. Atrial fibrillation: CHA2DS2-VASc Score is 7. INR therapeutic. Continue Coumadin per pharmacy. Continue metoprolol. INR therapeutic. 8. Hypokalemia: Replace aggressively. Follow BMP in a.m. Magnesium 2.3. 9. Acute on stage III chronic kidney disease: Baseline creatinine 1.6-1.8. Admitting creatinine 2.49. Likely due to diuretics and dehydration. Temporarily held diuretics. Follow BMP in a.m. 10. CLL: Follows with outpatient oncology. Not on any medications. 11. Elevated troponin: Flat trend. No chest pain. Likely demand ischemia from acute respiratory issues and renal insufficiency. Continue when necessary NTG, statins and metoprolol. 12. Left knee pain: No acute findings on the exam. X-ray suggestive of osteoarthritis. Supportive treatment. Last outpatient, chronic symptoms for the last 6-8 months. Has seen orthopedics for right knee replacement in past and can follow-up outpatient.   DVT prophylaxis: Anticoagulated on Coumadin Code Status: Full Family Communication: None at bedside Disposition Plan: Admitted to step down. DC home when medically stable.   Consultants:   None  Procedures:   BiPAP  Antimicrobials:   None    Subjective: Dyspnea continues to improve and may be close to baseline. Chronic left lower extremity pain and swelling without acute change in symptoms.  Objective:  Vitals:   09/16/16 0839 09/16/16 0845 09/16/16 0945 09/16/16 1231  BP:    107/66  Pulse:  91  Resp:    (!) 27  Temp:    97.6 F (36.4 C)  TempSrc:    Oral    SpO2: 95% 94% 95% 94%  Weight:      Height:        Intake/Output Summary (Last 24 hours) at 09/16/16 1504 Last data filed at 09/16/16 1210  Gross per 24 hour  Intake              480 ml  Output             1625 ml  Net            -1145 ml   Filed Weights   09/15/16 1654  Weight: 124.8 kg (275 lb 2.2 oz)    Examination:  General exam: Pleasant elderly male, moderately built and obese, lying comfortably propped up in the Bed. Respiratory system: Improved breath sounds. Occasional expiratory rhonchi. Respiratory effort normal. Cardiovascular system: S1 & S2 heard, RRR. No JVD, murmurs, rubs, gallops or clicks. No pedal edema. Telemetry: Sinus rhythm. 7 beat NSVT noted on 12/28 at 7:35 PM. Gastrointestinal system: Abdomen is nondistended, soft and nontender. No organomegaly or masses felt. Normal bowel sounds heard. Central nervous system: Alert and oriented. No focal neurological deficits. Extremities: Symmetric 5 x 5 power. Skin: No rashes, lesions or ulcers Psychiatry: Judgement and insight appear normal. Mood & affect appropriate.     Data Reviewed: I have personally reviewed following labs and imaging studies  CBC:  Recent Labs Lab 09/14/16 1857 09/16/16 0442  WBC 33.4* 38.2*  NEUTROABS 16.4*  --   HGB 15.6 15.4  HCT 45.1 45.3  MCV 89.8 90.1  PLT 146* 0000000   Basic Metabolic Panel:  Recent Labs Lab 09/14/16 1857 09/14/16 2250 09/16/16 0442  NA 134*  --  136  K 3.1*  --  2.9*  CL 82*  --  80*  CO2 32  --  39*  GLUCOSE 192*  --  243*  BUN 101*  --  118*  CREATININE 2.49*  --  2.43*  CALCIUM 9.2  --  9.4  MG  --  2.3  --    GFR: Estimated Creatinine Clearance: 33.9 mL/min (by C-G formula based on SCr of 2.43 mg/dL (H)). Liver Function Tests: No results for input(s): AST, ALT, ALKPHOS, BILITOT, PROT, ALBUMIN in the last 168 hours. No results for input(s): LIPASE, AMYLASE in the last 168 hours. No results for input(s): AMMONIA in the last 168  hours. Coagulation Profile:  Recent Labs Lab 09/14/16 1857 09/15/16 0434 09/16/16 0442  INR 2.90 2.51 2.54   Cardiac Enzymes:  Recent Labs Lab 09/14/16 1857 09/14/16 2250 09/15/16 0434 09/15/16 1029  TROPONINI 0.03* 0.06* 0.04* 0.03*   BNP (last 3 results) No results for input(s): PROBNP in the last 8760 hours. HbA1C:  Recent Labs  09/15/16 0434  HGBA1C 8.1*   CBG:  Recent Labs Lab 09/15/16 1210 09/15/16 1714 09/15/16 2136 09/16/16 0825 09/16/16 1308  GLUCAP 322* 266* 278* 240* 293*   Lipid Profile:  Recent Labs  09/15/16 0434  CHOL 80  HDL 23*  LDLCALC 40  TRIG 86  CHOLHDL 3.5   Thyroid Function Tests: No results for input(s): TSH, T4TOTAL, FREET4, T3FREE, THYROIDAB in the last 72 hours. Anemia Panel: No results for input(s): VITAMINB12, FOLATE, FERRITIN, TIBC, IRON, RETICCTPCT in the last 72 hours.  Sepsis Labs: No results for input(s): PROCALCITON, LATICACIDVEN in the last 168 hours.  Recent Results (from the past 240 hour(s))  MRSA PCR Screening     Status: None   Collection Time: 09/15/16  8:05 PM  Result Value Ref Range Status   MRSA by PCR NEGATIVE NEGATIVE Final    Comment:        The GeneXpert MRSA Assay (FDA approved for NASAL specimens only), is one component of a comprehensive MRSA colonization surveillance program. It is not intended to diagnose MRSA infection nor to guide or monitor treatment for MRSA infections.          Radiology Studies: No results found.      Scheduled Meds: . amLODipine  2.5 mg Oral Daily  . dextromethorphan-guaiFENesin  1 tablet Oral BID  . feeding supplement (GLUCERNA SHAKE)  237 mL Oral TID BM  . insulin aspart  0-15 Units Subcutaneous TID WC  . insulin aspart  0-5 Units Subcutaneous QHS  . insulin glargine  10 Units Subcutaneous Daily  . ipratropium-albuterol  3 mL Nebulization QID  . methylPREDNISolone (SOLU-MEDROL) injection  60 mg Intravenous Q12H  . metoprolol succinate  12.5  mg Oral Daily  . mometasone-formoterol  2 puff Inhalation BID  . simvastatin  20 mg Oral QHS  . torsemide  60 mg Oral BID  . warfarin  3 mg Oral ONCE-1800  . Warfarin - Pharmacist Dosing Inpatient   Does not apply q1800   Continuous Infusions:   LOS: 2 days      [facility], MD [facility] Pager [phone]  If 7PM-7AM, please contact night-coverage [email or web address] Password [email or web address] 09/16/2016, 3:04 PM

## 2016-09-17 LAB — BASIC METABOLIC PANEL
Anion gap: 18 — ABNORMAL HIGH (ref 5–15)
BUN: 131 mg/dL — AB (ref 6–20)
CALCIUM: 9.7 mg/dL (ref 8.9–10.3)
CO2: 35 mmol/L — ABNORMAL HIGH (ref 22–32)
CREATININE: 2.23 mg/dL — AB (ref 0.61–1.24)
Chloride: 83 mmol/L — ABNORMAL LOW (ref 101–111)
GFR calc Af Amer: 30 mL/min — ABNORMAL LOW (ref >60)
GFR, EST NON AFRICAN AMERICAN: 26 mL/min — AB (ref >60)
GLUCOSE: 253 mg/dL — AB (ref 65–99)
Potassium: 3.1 mmol/L — ABNORMAL LOW (ref 3.5–5.1)
SODIUM: 136 mmol/L (ref 135–145)

## 2016-09-17 LAB — PROTIME-INR
INR: 2.36
PROTHROMBIN TIME: 26.3 s — AB (ref 11.4–15.2)

## 2016-09-17 LAB — GLUCOSE, CAPILLARY
GLUCOSE-CAPILLARY: 256 mg/dL — AB (ref 65–99)
GLUCOSE-CAPILLARY: 215 mg/dL — AB (ref 65–99)
GLUCOSE-CAPILLARY: 256 mg/dL — AB (ref 65–99)
Glucose-Capillary: 371 mg/dL — ABNORMAL HIGH (ref 65–99)

## 2016-09-17 MED ORDER — PREDNISONE 20 MG PO TABS
40.0000 mg | ORAL_TABLET | Freq: Every day | ORAL | Status: DC
  Administered 2016-09-17 – 2016-09-18 (×2): 40 mg via ORAL
  Filled 2016-09-17 (×2): qty 2

## 2016-09-17 MED ORDER — SODIUM CHLORIDE 0.9% FLUSH
3.0000 mL | INTRAVENOUS | Status: DC | PRN
  Administered 2016-09-21: 3 mL via INTRAVENOUS
  Filled 2016-09-17: qty 3

## 2016-09-17 MED ORDER — MORPHINE SULFATE (PF) 2 MG/ML IV SOLN
1.0000 mg | Freq: Once | INTRAVENOUS | Status: AC
  Administered 2016-09-17: 1 mg via INTRAVENOUS
  Filled 2016-09-17: qty 1

## 2016-09-17 MED ORDER — POTASSIUM CHLORIDE CRYS ER 20 MEQ PO TBCR
40.0000 meq | EXTENDED_RELEASE_TABLET | Freq: Once | ORAL | Status: AC
  Administered 2016-09-17: 40 meq via ORAL
  Filled 2016-09-17: qty 2

## 2016-09-17 MED ORDER — SODIUM CHLORIDE 0.9% FLUSH
3.0000 mL | Freq: Two times a day (BID) | INTRAVENOUS | Status: DC
  Administered 2016-09-17 – 2016-09-23 (×10): 3 mL via INTRAVENOUS

## 2016-09-17 MED ORDER — WARFARIN SODIUM 6 MG PO TABS
6.0000 mg | ORAL_TABLET | Freq: Once | ORAL | Status: AC
  Administered 2016-09-17: 6 mg via ORAL
  Filled 2016-09-17: qty 1

## 2016-09-17 MED ORDER — INSULIN GLARGINE 100 UNIT/ML ~~LOC~~ SOLN
20.0000 [IU] | Freq: Every day | SUBCUTANEOUS | Status: DC
  Administered 2016-09-17 – 2016-09-23 (×7): 20 [IU] via SUBCUTANEOUS
  Filled 2016-09-17 (×7): qty 0.2

## 2016-09-17 NOTE — Progress Notes (Signed)
Physical Therapy Treatment Patient Details Name: William Braun MRN: [id number] DOB: Feb 07, 1933 Today's Date: 09/17/2016    History of Present Illness pt presents with COPD Exacerbation.  pt with hx of COPD, Chronic Respiratory Failure, OSA, HTN, DM, CLL, CHF, A-fib, CVA, and A-fib.      PT Comments    Patient progressing slowly with mobility. Continues to be limited by dyspnea on exertion and requiring 7L/min 02 during activity. Able to perform transfer to chair as simulated at home to scooter with min guard for safety. Attempting to wean pt's 02 down- decreased to 6L/min 02 and educated family and pt on Sp02 expectations. Encouraged up in chair for better positioning of lungs and ventilation. Will continue to follow.   Follow Up Recommendations  Home health PT;Supervision/Assistance - 24 hour     Equipment Recommendations  None recommended by PT    Recommendations for Other Services       Precautions / Restrictions Precautions Precautions: Fall Precaution Comments: Watch O2 sats Restrictions Weight Bearing Restrictions: No    Mobility  Bed Mobility Overal bed mobility: Needs Assistance Bed Mobility: Supine to Sit     Supine to sit: Supervision;HOB elevated     General bed mobility comments: Able to get to EOB without assist, increased time and use of rail. No dizziness but SOB noted.   Transfers Overall transfer level: Needs assistance   Transfers: Sit to/from [name] Sit to Stand: Min guard   Squat pivot transfers: Min guard     General transfer comment: Able to set up chair as he does at home, stand from EOB without assist reaching for armrests of chair and turn bottom around to sit. No assist needed. DOE. Sp02 89-94% on 7L/min 02. Cues for pursed lip breathing.  Ambulation/Gait                 Stairs            Wheelchair Mobility    Modified Rankin (Stroke Patients Only)       Balance Overall balance assessment:  Needs assistance Sitting-balance support: Feet supported;No upper extremity supported Sitting balance-Leahy Scale: Good     Standing balance support: During functional activity Standing balance-Leahy Scale: Poor                      Cognition Arousal/Alertness: Awake/alert Behavior During Therapy: WFL for tasks assessed/performed Overall Cognitive Status: Within Functional Limits for tasks assessed                      Exercises Other Exercises Other Exercises: Tricep chair lifts x2    General Comments General comments (skin integrity, edema, etc.): Wife and son present in room. Attempting to wean pt down on 02, so dropped to 6L/min 02 from 7L.      Pertinent Vitals/Pain Pain Assessment: Faces Faces Pain Scale: Hurts little more Pain Location: left knee with mobility Pain Descriptors / Indicators: Sore;Aching Pain Intervention(s): Monitored during session;Repositioned    Home Living                      Prior Function            PT Goals (current goals can now be found in the care plan section) Progress towards PT goals: Progressing toward goals (slowly)    Frequency    Min 3X/week      PT Plan Current plan remains appropriate    Co-evaluation  End of Session Equipment Utilized During Treatment: Gait belt;Oxygen Activity Tolerance: Patient limited by fatigue Patient left: in chair;with call bell/phone within reach;with family/visitor present     Time: [id number] PT Time Calculation (min) (ACUTE ONLY): 22 min  Charges:  $Therapeutic Activity: 8-22 mins                    G Codes:      William Braun A Nicole Defino 09/17/2016, 12:17 PM Wray Kearns, [place], DPT [phone]

## 2016-09-17 NOTE — Progress Notes (Signed)
[place] for warfarin Indication: atrial fibrillation  Allergies  Allergen Reactions  . Quinine     Seizure     Patient Measurements: Height: 6\' 3"  (190.5 cm) Weight: 275 lb 2.2 oz (124.8 kg) IBW/kg (Calculated) : 84.5  Vital Signs: Temp: 97.5 F (36.4 C) (12/30 0700) Temp Source: Oral (12/30 0700) BP: 122/74 (12/30 0405) Pulse Rate: 93 (12/30 0700)  Labs:  Recent Labs  09/14/16 1857 09/14/16 2250 09/15/16 0434 09/15/16 1029 09/16/16 0442 09/17/16 0550  HGB 15.6  --   --   --  15.4  --   HCT 45.1  --   --   --  45.3  --   PLT 146*  --   --   --  179  --   LABPROT 30.9*  --  27.5*  --  27.9* 26.3*  INR 2.90  --  2.51  --  2.54 2.36  CREATININE 2.49*  --   --   --  2.43* 2.23*  TROPONINI 0.03* 0.06* 0.04* 0.03*  --   --     Estimated Creatinine Clearance: 37 mL/min (by C-G formula based on SCr of 2.23 mg/dL (H)).   Medical History: Past Medical History:  Diagnosis Date  . Atrial fibrillation ([place])   . Cerebrovascular disease   . CHF (congestive heart failure) ([place])   . CLL (chronic lymphocytic leukemia) ([place]) 11/17/2014  . COPD (chronic obstructive pulmonary disease) ([place])   . History of thrombocytopenia   . Obesity   . Osteoarthritis   . Other and unspecified hyperlipidemia   . Type II or unspecified type diabetes mellitus without mention of complication, not stated as uncontrolled   . Unspecified essential hypertension     Assessment: 82 yo male admitted with SOB. He is on warfarin for history of afib which will be continued inpatient. INR was therapeutic on admission.  PTA Dose: 6mg  Sat/Sun, 4.5mg  Wed, 3mg  Mon/Tues/Thurs/Fri  INR remains therapeutic today at 2.36. No notes of bleeding.   Goal of Therapy:  INR 2-3 Monitor platelets by anticoagulation protocol: Yes   Plan:  -Warfarin 6 mg po x1 -Daily INR -Monitor for signs/symptoms of bleeding  Demetrius Charity, PharmD Acute Care Pharmacy Resident  Pager:  [phone] 09/17/2016

## 2016-09-17 NOTE — Progress Notes (Signed)
PROGRESS NOTE  William Braun  [id number] DOB: Nov 22, 1932  DOA: 09/14/2016 PCP: Ileana Roup, MD   Brief Narrative:  82 year old male with PMH of chronic hypoxic respiratory failure on home oxygen 3 L/m continuously, OSA on nightly CPAP, HTN, HLD, DM, CLL, chronic diastolic CHF, atrial fibrillation on Coumadin, stroke, stage III chronic kidney disease, presented to ED with 2 days history of worsening dyspnea, minimal dry cough but no chest pain, fever or chills. In the ED, found to be hypoxic at 83% on 2 L/m oxygen, briefly on BiPAP in ED. Admitted to stepdown for possible COPD exacerbation. Improving slowly.   Assessment & Plan:   Principal Problem:   Acute on chronic respiratory failure with hypoxia (HCC) Active Problems:   HLD (hyperlipidemia)   Essential hypertension   ATRIAL FIBRILLATION   COPD exacerbation (HCC)   Chronic diastolic CHF (congestive heart failure) (HCC)   CLL (chronic lymphocytic leukemia) (HCC)   Type II diabetes mellitus with neurological manifestations (HCC)   Elevated troponin   Hypokalemia   Acute renal failure superimposed on stage 3 chronic kidney disease ([place])   1. COPD exacerbation: Briefly required BiPAP in ED. Now off of BiPAP. Treated with oxygen, bronchodilator nebulizations, IV Solu-Medrol. No antibiotics initiated due to nonproductive minimal cough. Influenza panel PCR A&B: Negative. Urinary streptococcal antigen: Negative. Former smoker. Improving. Currently on high flow nasal cannula oxygen at 7 L/m. Wean to maintain saturations between 89-92 percent. Changed steroids to by mouth. 2. Acute on chronic respiratory failure with hypoxia: On home oxygen 3 L/m. Hypoxic in ED at 83% on 2 L. Briefly required BiPAP. Now off of BiPAP. Management as per problem #1. Monitor closely. Low index of suspicion for PE while therapeutic on Coumadin and improvement with above measures. Wean oxygen to maintain saturations between 89-92 percent. Discussed with RN.    3. Chronic diastolic CHF: LVEF [id number] by echo 07/30/15. Appears compensated. Continue metoprolol. Clinically not volume overloaded. Creatinine remained stable at 2.4 but higher than his baseline. Hold diuretics on 12/29 and 12/30. Follow BMP in a.m. and clinically. Consider resuming low-dose diuretics on 12/31 if creatinine continues to improve 4. Type II DM: A1c 6.5 on 11/07/08. Holding by mouth meds. Continue SSI. Added low-dose Lantus due to worsening hyperglycemia while on steroids. Taper steroids as soon as possible. Repeat A1c: 8.1. Changed steroids to by mouth which should help. Increased Lantus to 20 units daily. Monitor closely. 5. HLD: Continue Zocor. 6. HTN: Continue amlodipine and metoprolol. Controlled. 7. Atrial fibrillation: CHA2DS2-VASc Score is 7. INR therapeutic. Continue Coumadin per pharmacy. Continue metoprolol. INR therapeutic. 8. Hypokalemia: Replace aggressively. Follow BMP in a.m. Magnesium 2.3. 9. Acute on stage III chronic kidney disease: Baseline creatinine 1.6-1.8. Admitting creatinine 2.49. Likely due to diuretics and dehydration. Temporarily held diuretics. Creatinine has improved to 2.2. Follow BMP in a.m. 10. CLL: Follows with outpatient oncology. Not on any medications. 11. Elevated troponin: Flat trend. No chest pain. Likely demand ischemia from acute respiratory issues and renal insufficiency. Continue when necessary NTG, statins and metoprolol. 12. Left knee pain: No acute findings on the exam. X-ray suggestive of osteoarthritis. Supportive treatment. Last outpatient, chronic symptoms for the last 6-8 months. Has seen orthopedics for right knee replacement in past and can follow-up outpatient.   DVT prophylaxis: Anticoagulated on Coumadin Code Status: Full Family Communication: None at bedside Disposition Plan: Admitted to step down. Remains in stepdown due to high oxygen requirement.   Consultants:   None  Procedures:   BiPAP  Antimicrobials:   None     Subjective: Dyspnea continues to improve and may be close to baseline. Denies any other complaints.  Objective:  Vitals:   09/17/16 0800 09/17/16 0806 09/17/16 1100 09/17/16 1125  BP:   110/75   Pulse:   (!) 102   Resp:   20   Temp:   97.4 F (36.3 C)   TempSrc:   Oral   SpO2: 93% 94% 95% 92%  Weight:      Height:        Intake/Output Summary (Last 24 hours) at 09/17/16 1313 Last data filed at 09/17/16 0700  Gross per 24 hour  Intake              960 ml  Output             1650 ml  Net             -690 ml   Filed Weights   09/15/16 1654  Weight: 124.8 kg (275 lb 2.2 oz)    Examination:  General exam: Pleasant elderly male, moderately built and obese, Sitting up comfortably at edge of bed. Respiratory system: Clear to auscultation although distant breath sounds. No rhonchi or wheezing heard. Respiratory effort normal. Cardiovascular system: S1 & S2 heard, RRR. No JVD, murmurs, rubs, gallops or clicks. No pedal edema. Telemetry: A. fib with controlled ventricular rate Gastrointestinal system: Abdomen is nondistended, soft and nontender. No organomegaly or masses felt. Normal bowel sounds heard. Central nervous system: Alert and oriented. No focal neurological deficits. Extremities: Symmetric 5 x 5 power. Left knee with boggy chronic swelling without acute findings. Right knee with surgical scar. Skin: No rashes, lesions or ulcers Psychiatry: Judgement and insight appear normal. Mood & affect appropriate.     Data Reviewed: I have personally reviewed following labs and imaging studies  CBC:  Recent Labs Lab 09/14/16 1857 09/16/16 0442  WBC 33.4* 38.2*  NEUTROABS 16.4*  --   HGB 15.6 15.4  HCT 45.1 45.3  MCV 89.8 90.1  PLT 146* 0000000   Basic Metabolic Panel:  Recent Labs Lab 09/14/16 1857 09/14/16 2250 09/16/16 0442 09/17/16 0550  NA 134*  --  136 136  K 3.1*  --  2.9* 3.1*  CL 82*  --  80* 83*  CO2 32  --  39* 35*  GLUCOSE 192*  --  243* 253*   BUN 101*  --  118* 131*  CREATININE 2.49*  --  2.43* 2.23*  CALCIUM 9.2  --  9.4 9.7  MG  --  2.3  --   --    GFR: Estimated Creatinine Clearance: 37 mL/min (by C-G formula based on SCr of 2.23 mg/dL (H)). Liver Function Tests: No results for input(s): AST, ALT, ALKPHOS, BILITOT, PROT, ALBUMIN in the last 168 hours. No results for input(s): LIPASE, AMYLASE in the last 168 hours. No results for input(s): AMMONIA in the last 168 hours. Coagulation Profile:  Recent Labs Lab 09/14/16 1857 09/15/16 0434 09/16/16 0442 09/17/16 0550  INR 2.90 2.51 2.54 2.36   Cardiac Enzymes:  Recent Labs Lab 09/14/16 1857 09/14/16 2250 09/15/16 0434 09/15/16 1029  TROPONINI 0.03* 0.06* 0.04* 0.03*   BNP (last 3 results) No results for input(s): PROBNP in the last 8760 hours. HbA1C:  Recent Labs  09/15/16 0434 09/15/16 2000  HGBA1C 8.1* 8.1*   CBG:  Recent Labs Lab 09/16/16 0825 09/16/16 1308 09/16/16 1703 09/17/16 0740 09/17/16 1116  GLUCAP 240* 293* 320* 256* 215*  Lipid Profile:  Recent Labs  09/15/16 0434  CHOL 80  HDL 23*  LDLCALC 40  TRIG 86  CHOLHDL 3.5   Thyroid Function Tests: No results for input(s): TSH, T4TOTAL, FREET4, T3FREE, THYROIDAB in the last 72 hours. Anemia Panel: No results for input(s): VITAMINB12, FOLATE, FERRITIN, TIBC, IRON, RETICCTPCT in the last 72 hours.  Sepsis Labs: No results for input(s): PROCALCITON, LATICACIDVEN in the last 168 hours.  Recent Results (from the past 240 hour(s))  MRSA PCR Screening     Status: None   Collection Time: 09/15/16  8:05 PM  Result Value Ref Range Status   MRSA by PCR NEGATIVE NEGATIVE Final    Comment:        The GeneXpert MRSA Assay (FDA approved for NASAL specimens only), is one component of a comprehensive MRSA colonization surveillance program. It is not intended to diagnose MRSA infection nor to guide or monitor treatment for MRSA infections.          Radiology Studies: No  results found.      Scheduled Meds: . amLODipine  2.5 mg Oral Daily  . dextromethorphan-guaiFENesin  1 tablet Oral BID  . feeding supplement (GLUCERNA SHAKE)  237 mL Oral TID BM  . insulin aspart  0-15 Units Subcutaneous TID WC  . insulin aspart  0-5 Units Subcutaneous QHS  . insulin glargine  20 Units Subcutaneous Daily  . ipratropium-albuterol  3 mL Nebulization QID  . metoprolol succinate  12.5 mg Oral Daily  . mometasone-formoterol  2 puff Inhalation BID  . predniSONE  40 mg Oral Q breakfast  . senna-docusate  1 tablet Oral BID  . simvastatin  20 mg Oral QHS  . sodium chloride flush  3 mL Intravenous Q12H  . warfarin  6 mg Oral ONCE-1800  . Warfarin - Pharmacist Dosing Inpatient   Does not apply q1800   Continuous Infusions:   LOS: 3 days      [facility], MD [facility] Pager [phone]  If 7PM-7AM, please contact night-coverage [email or web address] Password [email or web address] 09/17/2016, 1:13 PM

## 2016-09-18 ENCOUNTER — Inpatient Hospital Stay (HOSPITAL_COMMUNITY): Payer: PPO

## 2016-09-18 LAB — BLOOD GAS, ARTERIAL
Acid-Base Excess: 14.2 mmol/L — ABNORMAL HIGH (ref 0.0–2.0)
Bicarbonate: 39.1 mmol/L — ABNORMAL HIGH (ref 20.0–28.0)
Delivery systems: POSITIVE
Drawn by: 40415
Expiratory PAP: 8
FIO2: 80
Inspiratory PAP: 16
O2 Saturation: 91.9 %
Patient temperature: 98.6
pCO2 arterial: 56.4 mmHg — ABNORMAL HIGH (ref 32.0–48.0)
pH, Arterial: 7.455 — ABNORMAL HIGH (ref 7.350–7.450)
pO2, Arterial: 64.4 mmHg — ABNORMAL LOW (ref 83.0–108.0)

## 2016-09-18 LAB — BASIC METABOLIC PANEL
ANION GAP: 13 (ref 5–15)
BUN: 115 mg/dL — AB (ref 6–20)
CHLORIDE: 87 mmol/L — AB (ref 101–111)
CO2: 38 mmol/L — ABNORMAL HIGH (ref 22–32)
Calcium: 9.4 mg/dL (ref 8.9–10.3)
Creatinine, Ser: 1.86 mg/dL — ABNORMAL HIGH (ref 0.61–1.24)
GFR calc non Af Amer: 32 mL/min — ABNORMAL LOW (ref >60)
GFR, EST AFRICAN AMERICAN: 37 mL/min — AB (ref >60)
Glucose, Bld: 142 mg/dL — ABNORMAL HIGH (ref 65–99)
POTASSIUM: 2.9 mmol/L — AB (ref 3.5–5.1)
SODIUM: 138 mmol/L (ref 135–145)

## 2016-09-18 LAB — GLUCOSE, CAPILLARY
Glucose-Capillary: 119 mg/dL — ABNORMAL HIGH (ref 65–99)
Glucose-Capillary: 240 mg/dL — ABNORMAL HIGH (ref 65–99)
Glucose-Capillary: 345 mg/dL — ABNORMAL HIGH (ref 65–99)
Glucose-Capillary: 348 mg/dL — ABNORMAL HIGH (ref 65–99)

## 2016-09-18 LAB — PROTIME-INR
INR: 2.36
Prothrombin Time: 26.3 s — ABNORMAL HIGH (ref 11.4–15.2)

## 2016-09-18 MED ORDER — WARFARIN SODIUM 6 MG PO TABS
6.0000 mg | ORAL_TABLET | Freq: Once | ORAL | Status: AC
  Administered 2016-09-18: 6 mg via ORAL
  Filled 2016-09-18: qty 1

## 2016-09-18 MED ORDER — GUAIFENESIN-DM 100-10 MG/5ML PO SYRP
10.0000 mL | ORAL_SOLUTION | ORAL | Status: DC | PRN
  Administered 2016-09-18: 10 mL via ORAL
  Filled 2016-09-18: qty 10

## 2016-09-18 MED ORDER — FUROSEMIDE 10 MG/ML IJ SOLN
60.0000 mg | Freq: Once | INTRAMUSCULAR | Status: AC
  Administered 2016-09-18: 60 mg via INTRAVENOUS
  Filled 2016-09-18: qty 6

## 2016-09-18 MED ORDER — POTASSIUM CHLORIDE CRYS ER 20 MEQ PO TBCR
40.0000 meq | EXTENDED_RELEASE_TABLET | ORAL | Status: AC
  Administered 2016-09-18 (×2): 40 meq via ORAL
  Filled 2016-09-18 (×2): qty 2

## 2016-09-18 MED ORDER — LORAZEPAM 2 MG/ML IJ SOLN
0.5000 mg | Freq: Once | INTRAMUSCULAR | Status: AC
Start: 2016-09-18 — End: 2016-09-18
  Administered 2016-09-18: 0.5 mg via INTRAVENOUS
  Filled 2016-09-18: qty 1

## 2016-09-18 NOTE — Progress Notes (Signed)
Pt refused flutter valve, pt states he's "had it before, doesn't want and is expensive".  RN aware.

## 2016-09-18 NOTE — Progress Notes (Signed)
PROGRESS NOTE  RIDGELY FAWCETT  [id number] DOB: 05/05/1933  DOA: 09/14/2016 PCP: Ileana Roup, MD   Brief Narrative:  82 year old male with PMH of chronic hypoxic respiratory failure on home oxygen 3 L/m continuously, OSA on nightly CPAP, HTN, HLD, DM, CLL, chronic diastolic CHF, atrial fibrillation on Coumadin, stroke, stage III chronic kidney disease, presented to ED with 2 days history of worsening dyspnea, minimal dry cough but no chest pain, fever or chills. In the ED, found to be hypoxic at 83% on 2 L/m oxygen, briefly on BiPAP in ED. Admitted to stepdown for possible COPD exacerbation. Improving slowly.   Assessment & Plan:   Principal Problem:   Acute on chronic respiratory failure with hypoxia (HCC) Active Problems:   HLD (hyperlipidemia)   Essential hypertension   ATRIAL FIBRILLATION   COPD exacerbation (HCC)   Chronic diastolic CHF (congestive heart failure) (HCC)   CLL (chronic lymphocytic leukemia) (HCC)   Type II diabetes mellitus with neurological manifestations (HCC)   Elevated troponin   Hypokalemia   Acute renal failure superimposed on stage 3 chronic kidney disease ([place])   1. COPD exacerbation: Briefly required BiPAP in ED. Now off of BiPAP. Treated with oxygen, bronchodilator nebulizations, IV Solu-Medrol. No antibiotics initiated due to nonproductive minimal cough. Influenza panel PCR A&B: Negative. Urinary streptococcal antigen: Negative. Former smoker. Improving. Currently on nasal cannula oxygen at 5 L/m. Wean to maintain saturations between 89-92 percent. Changed steroids to by mouth. Improving. Transfer to telemetry 12/31. 2. Acute on chronic respiratory failure with hypoxia: On home oxygen 3 L/m. Hypoxic in ED at 83% on 2 L. Briefly required BiPAP. Now off of BiPAP. Management as per problem #1. Monitor closely. Low index of suspicion for PE while therapeutic on Coumadin and improvement with above measures. Wean oxygen to maintain saturations between  89-92 percent. Discussed with RN.  3. Chronic diastolic CHF: LVEF [id number] by echo 07/30/15. Appears compensated. Continue metoprolol. Clinically not volume overloaded. Creatinine remained stable at 2.4 but higher than his baseline. Continue to hold diuretics for now. Reassess volume status in a.m. 4. Type II DM: A1c 6.5 on 11/07/08. Holding by mouth meds. Continue SSI. Added low-dose Lantus due to worsening hyperglycemia while on steroids. Taper steroids as soon as possible. Repeat A1c: 8.1. Changed steroids to by mouth which should help. Increased Lantus to 20 units daily. Monitor closely. 5. HLD: Continue Zocor. 6. HTN: Continue amlodipine and metoprolol. Controlled. 7. Atrial fibrillation: CHA2DS2-VASc Score is 7. INR therapeutic. Continue Coumadin per pharmacy. Continue metoprolol. INR therapeutic. 8. Hypokalemia: Replace aggressively. Follow BMP in a.m. Magnesium 2.3. 9. Acute on stage III chronic kidney disease: Baseline creatinine 1.6-1.8. Admitting creatinine 2.49. Likely due to diuretics and dehydration. Temporarily held diuretics. Creatinine has improved to 1.8. Follow BMP in a.m. 10. CLL: Follows with outpatient oncology. Not on any medications. 11. Elevated troponin: Flat trend. No chest pain. Likely demand ischemia from acute respiratory issues and renal insufficiency. Continue when necessary NTG, statins and metoprolol. 12. Left knee pain: No acute findings on the exam. X-ray suggestive of osteoarthritis. Supportive treatment. Last outpatient, chronic symptoms for the last 6-8 months. Has seen orthopedics for right knee replacement in past and can follow-up outpatient.   DVT prophylaxis: Anticoagulated on Coumadin Code Status: Full Family Communication: None at bedside Disposition Plan: Admitted to step down. Transfer to telemetry 12/31. Possible DC home once/1.   Consultants:   None  Procedures:   BiPAP  Antimicrobials:   None    Subjective: Dyspnea continues  to improve  back to baseline. Denies any other complaints. As per RN, no acute issues.  Objective:  Vitals:   09/18/16 0809 09/18/16 0834 09/18/16 1204 09/18/16 1253  BP:  128/75  107/74  Pulse:  88  96  Resp:  (!) 22  (!) 26  Temp:  98.8 F (37.1 C)  98.1 F (36.7 C)  TempSrc:  Oral  Oral  SpO2: 98% 93% 95% 92%  Weight:      Height:        Intake/Output Summary (Last 24 hours) at 09/18/16 1333 Last data filed at 09/18/16 1100  Gross per 24 hour  Intake             1050 ml  Output             1825 ml  Net             -775 ml   Filed Weights   09/15/16 1654  Weight: 124.8 kg (275 lb 2.2 oz)    Examination:  General exam: Pleasant elderly male, moderately built and obese, Sitting up comfortably at edge of bed. Respiratory system: Clear to auscultation although distant breath sounds. No rhonchi or wheezing heard. Respiratory effort normal. Cardiovascular system: S1 & S2 heard, RRR. No JVD, murmurs, rubs, gallops or clicks. No pedal edema. Telemetry: Paroxysmal A. fib with controlled ventricular rate Gastrointestinal system: Abdomen is nondistended, soft and nontender. No organomegaly or masses felt. Normal bowel sounds heard. Central nervous system: Alert and oriented. No focal neurological deficits. Extremities: Symmetric 5 x 5 power. Left knee with boggy chronic swelling without acute findings. Right knee with surgical scar. Skin: No rashes, lesions or ulcers Psychiatry: Judgement and insight appear normal. Mood & affect appropriate.     Data Reviewed: I have personally reviewed following labs and imaging studies  CBC:  Recent Labs Lab 09/14/16 1857 09/16/16 0442  WBC 33.4* 38.2*  NEUTROABS 16.4*  --   HGB 15.6 15.4  HCT 45.1 45.3  MCV 89.8 90.1  PLT 146* 0000000   Basic Metabolic Panel:  Recent Labs Lab 09/14/16 1857 09/14/16 2250 09/16/16 0442 09/17/16 0550 09/18/16 0412  NA 134*  --  136 136 138  K 3.1*  --  2.9* 3.1* 2.9*  CL 82*  --  80* 83* 87*  CO2 32  --   39* 35* 38*  GLUCOSE 192*  --  243* 253* 142*  BUN 101*  --  118* 131* 115*  CREATININE 2.49*  --  2.43* 2.23* 1.86*  CALCIUM 9.2  --  9.4 9.7 9.4  MG  --  2.3  --   --   --    GFR: Estimated Creatinine Clearance: 44.3 mL/min (by C-G formula based on SCr of 1.86 mg/dL (H)). Liver Function Tests: No results for input(s): AST, ALT, ALKPHOS, BILITOT, PROT, ALBUMIN in the last 168 hours. No results for input(s): LIPASE, AMYLASE in the last 168 hours. No results for input(s): AMMONIA in the last 168 hours. Coagulation Profile:  Recent Labs Lab 09/14/16 1857 09/15/16 0434 09/16/16 0442 09/17/16 0550 09/18/16 0412  INR 2.90 2.51 2.54 2.36 2.36   Cardiac Enzymes:  Recent Labs Lab 09/14/16 1857 09/14/16 2250 09/15/16 0434 09/15/16 1029  TROPONINI 0.03* 0.06* 0.04* 0.03*   BNP (last 3 results) No results for input(s): PROBNP in the last 8760 hours. HbA1C:  Recent Labs  09/15/16 2000  HGBA1C 8.1*   CBG:  Recent Labs Lab 09/17/16 1116 09/17/16 1658 09/17/16 2126 09/18/16 [id number] 09/18/16  [place]* 256* 371* 119* 240*   Lipid Profile: No results for input(s): CHOL, HDL, LDLCALC, TRIG, CHOLHDL, LDLDIRECT in the last 72 hours. Thyroid Function Tests: No results for input(s): TSH, T4TOTAL, FREET4, T3FREE, THYROIDAB in the last 72 hours. Anemia Panel: No results for input(s): VITAMINB12, FOLATE, FERRITIN, TIBC, IRON, RETICCTPCT in the last 72 hours.  Sepsis Labs: No results for input(s): PROCALCITON, LATICACIDVEN in the last 168 hours.  Recent Results (from the past 240 hour(s))  MRSA PCR Screening     Status: None   Collection Time: 09/15/16  8:05 PM  Result Value Ref Range Status   MRSA by PCR NEGATIVE NEGATIVE Final    Comment:        The GeneXpert MRSA Assay (FDA approved for NASAL specimens only), is one component of a comprehensive MRSA colonization surveillance program. It is not intended to diagnose MRSA infection nor to guide or monitor  treatment for MRSA infections.          Radiology Studies: No results found.      Scheduled Meds: . amLODipine  2.5 mg Oral Daily  . dextromethorphan-guaiFENesin  1 tablet Oral BID  . feeding supplement (GLUCERNA SHAKE)  237 mL Oral TID BM  . insulin aspart  0-15 Units Subcutaneous TID WC  . insulin aspart  0-5 Units Subcutaneous QHS  . insulin glargine  20 Units Subcutaneous Daily  . ipratropium-albuterol  3 mL Nebulization QID  . metoprolol succinate  12.5 mg Oral Daily  . mometasone-formoterol  2 puff Inhalation BID  . predniSONE  40 mg Oral Q breakfast  . senna-docusate  1 tablet Oral BID  . simvastatin  20 mg Oral QHS  . sodium chloride flush  3 mL Intravenous Q12H  . warfarin  6 mg Oral ONCE-1800  . Warfarin - Pharmacist Dosing Inpatient   Does not apply q1800   Continuous Infusions:   LOS: 4 days      [facility], MD [facility] Pager [phone]  If 7PM-7AM, please contact night-coverage [email or web address] Password [email or web address] 09/18/2016, 1:33 PM

## 2016-09-18 NOTE — Progress Notes (Signed)
[place] for warfarin Indication: atrial fibrillation  Allergies  Allergen Reactions  . Quinine     Seizure     Patient Measurements: Height: 6\' 3"  (190.5 cm) Weight: 275 lb 2.2 oz (124.8 kg) IBW/kg (Calculated) : 84.5  Vital Signs: Temp: 98.8 F (37.1 C) (12/31 0834) Temp Source: Oral (12/31 0834) BP: 128/75 (12/31 0834) Pulse Rate: 88 (12/31 0834)  Labs:  Recent Labs  09/15/16 1029 09/16/16 0442 09/17/16 0550 09/18/16 0412  HGB  --  15.4  --   --   HCT  --  45.3  --   --   PLT  --  179  --   --   LABPROT  --  27.9* 26.3* 26.3*  INR  --  2.54 2.36 2.36  CREATININE  --  2.43* 2.23* 1.86*  TROPONINI 0.03*  --   --   --     Estimated Creatinine Clearance: 44.3 mL/min (by C-G formula based on SCr of 1.86 mg/dL (H)).   Medical History: Past Medical History:  Diagnosis Date  . Atrial fibrillation ([place])   . Cerebrovascular disease   . CHF (congestive heart failure) ([place])   . CLL (chronic lymphocytic leukemia) ([place]) 11/17/2014  . COPD (chronic obstructive pulmonary disease) ([place])   . History of thrombocytopenia   . Obesity   . Osteoarthritis   . Other and unspecified hyperlipidemia   . Type II or unspecified type diabetes mellitus without mention of complication, not stated as uncontrolled   . Unspecified essential hypertension     Assessment: 82 yo male admitted with SOB. He is on warfarin for history of afib which will be continued inpatient. INR was therapeutic on admission.  PTA Dose: 6mg  Sat/Sun, 4.5mg  Wed, 3mg  Mon/Tues/Thurs/Fri  Drug interactions: steroids can have variable effect on INR. Currently, prednisone does not appear to be effecting patient's INR.   INR remains therapeutic today at 2.36. No notes of bleeding.   Goal of Therapy:  INR 2-3 Monitor platelets by anticoagulation protocol: Yes   Plan:  -Warfarin 6 mg po x1 -Daily INR -Monitor for signs/symptoms of bleeding  Demetrius Charity, PharmD Acute  Care Pharmacy Resident  Pager: [phone] 09/18/2016

## 2016-09-18 NOTE — Progress Notes (Signed)
RN called d/t pt desat earlier s/p using commode.  RN placed pt on 15 lpm HFNC.  Upon entering room, pt sat at 94-96%.  Pt was eating lunch and denies SOB currently, just c/o coughing spells.  PRN tx given.  Oxygen turned to 12 lpm, sat 96-97% now.  No distress noted.

## 2016-09-18 NOTE — Progress Notes (Signed)
Addendum  As per RN report, patient having increased coughing, congestion, wheezing and briefly cyanotic with minimal activity and oxygen had to be increased to 15 L/m and had 15 beat NSVT. Get repeat chest x-ray. Continue monitoring on telemetry in stepdown. Cancel transfer to telemetry. Replacing potassium. Magnesium recently 2.3. Get 2-D echo to assess LV function. Consider resuming diuretics after reviewing chest x-ray. If does not continue to make steady improvement, may consult pulmonology on 09/19/2016.  Vernell Leep, MD, FACP, FHM. [facility] Pager [phone]  If 7PM-7AM, please contact night-coverage [email or web address] Password [email or web address] 09/18/2016, 3:09 PM

## 2016-09-18 NOTE — Progress Notes (Signed)
Patient assisted to bedside commode after incontinent of large amt of grainy stool.  Was extremely SOB and cyanotic with oxygen sats in low 70's.  Having episodes of increased wheeziness followed by coughing.  Respiratory therapy called and prn breathing treatment administered.  Sats improving with rest and with increasing the flow rate on HFoxygen therapy unit.

## 2016-09-19 ENCOUNTER — Inpatient Hospital Stay (HOSPITAL_COMMUNITY): Payer: PPO

## 2016-09-19 DIAGNOSIS — I5033 Acute on chronic diastolic (congestive) heart failure: Secondary | ICD-10-CM

## 2016-09-19 DIAGNOSIS — J189 Pneumonia, unspecified organism: Secondary | ICD-10-CM

## 2016-09-19 DIAGNOSIS — J9601 Acute respiratory failure with hypoxia: Secondary | ICD-10-CM

## 2016-09-19 LAB — BLOOD GAS, ARTERIAL
Acid-Base Excess: 17.3 mmol/L — ABNORMAL HIGH (ref 0.0–2.0)
Bicarbonate: 42.3 mmol/L — ABNORMAL HIGH (ref 20.0–28.0)
Delivery systems: POSITIVE
Drawn by: 426241
Expiratory PAP: 8
FIO2: 1
Inspiratory PAP: 16
O2 Saturation: 93.1 %
PEEP: 8 cmH2O
Patient temperature: 98.6
RATE: 10 resp/min
pCO2 arterial: 57.5 mmHg — ABNORMAL HIGH (ref 32.0–48.0)
pH, Arterial: 7.479 — ABNORMAL HIGH (ref 7.350–7.450)
pO2, Arterial: 66.8 mmHg — ABNORMAL LOW (ref 83.0–108.0)

## 2016-09-19 LAB — GLUCOSE, CAPILLARY
GLUCOSE-CAPILLARY: 194 mg/dL — AB (ref 65–99)
GLUCOSE-CAPILLARY: 229 mg/dL — AB (ref 65–99)
Glucose-Capillary: 116 mg/dL — ABNORMAL HIGH (ref 65–99)
Glucose-Capillary: 119 mg/dL — ABNORMAL HIGH (ref 65–99)

## 2016-09-19 LAB — BASIC METABOLIC PANEL
ANION GAP: 13 (ref 5–15)
Anion gap: 16 — ABNORMAL HIGH (ref 5–15)
BUN: 88 mg/dL — AB (ref 6–20)
BUN: 89 mg/dL — ABNORMAL HIGH (ref 6–20)
CALCIUM: 9.5 mg/dL (ref 8.9–10.3)
CO2: 40 mmol/L — ABNORMAL HIGH (ref 22–32)
CO2: 39 mmol/L — ABNORMAL HIGH (ref 22–32)
CREATININE: 1.69 mg/dL — AB (ref 0.61–1.24)
CREATININE: 2.09 mg/dL — AB (ref 0.61–1.24)
Calcium: 9.9 mg/dL (ref 8.9–10.3)
Chloride: 88 mmol/L — ABNORMAL LOW (ref 101–111)
Chloride: 94 mmol/L — ABNORMAL LOW (ref 101–111)
GFR calc Af Amer: 42 mL/min — ABNORMAL LOW (ref >60)
GFR calc Af Amer: 33 mL/min — ABNORMAL LOW (ref >60)
GFR, EST NON AFRICAN AMERICAN: 36 mL/min — AB (ref >60)
GFR, EST NON AFRICAN AMERICAN: 28 mL/min — AB (ref >60)
Glucose, Bld: 92 mg/dL (ref 65–99)
Glucose, Bld: 133 mg/dL — ABNORMAL HIGH (ref 65–99)
Potassium: 3.1 mmol/L — ABNORMAL LOW (ref 3.5–5.1)
Potassium: 3.8 mmol/L (ref 3.5–5.1)
SODIUM: 144 mmol/L (ref 135–145)
SODIUM: 146 mmol/L — AB (ref 135–145)

## 2016-09-19 LAB — ECHOCARDIOGRAM COMPLETE
HEIGHTINCHES: 75 in
Weight: 4402.15 oz

## 2016-09-19 LAB — CBC
HEMATOCRIT: 51.2 % (ref 39.0–52.0)
HEMOGLOBIN: 17.5 g/dL — AB (ref 13.0–17.0)
MCH: 30.7 pg (ref 26.0–34.0)
MCHC: 34.2 g/dL (ref 30.0–36.0)
MCV: 89.8 fL (ref 78.0–100.0)
Platelets: 198 10*3/uL (ref 150–400)
RBC: 5.7 MIL/uL (ref 4.22–5.81)
RDW: 16.2 % — AB (ref 11.5–15.5)
WBC: 49.4 10*3/uL — AB (ref 4.0–10.5)

## 2016-09-19 LAB — PROTIME-INR
INR: 3.18
Prothrombin Time: 33.3 seconds — ABNORMAL HIGH (ref 11.4–15.2)

## 2016-09-19 LAB — MAGNESIUM: MAGNESIUM: 2.5 mg/dL — AB (ref 1.7–2.4)

## 2016-09-19 LAB — TROPONIN I: Troponin I: 0.05 ng/mL (ref <0.03)

## 2016-09-19 MED ORDER — IPRATROPIUM-ALBUTEROL 0.5-2.5 (3) MG/3ML IN SOLN
3.0000 mL | RESPIRATORY_TRACT | Status: DC
  Administered 2016-09-19 – 2016-09-20 (×7): 3 mL via RESPIRATORY_TRACT
  Filled 2016-09-19 (×6): qty 3
  Filled 2016-09-19: qty 39
  Filled 2016-09-19: qty 3

## 2016-09-19 MED ORDER — POTASSIUM CHLORIDE CRYS ER 20 MEQ PO TBCR
40.0000 meq | EXTENDED_RELEASE_TABLET | Freq: Three times a day (TID) | ORAL | Status: DC
  Filled 2016-09-19: qty 2

## 2016-09-19 MED ORDER — FUROSEMIDE 10 MG/ML IJ SOLN
60.0000 mg | Freq: Two times a day (BID) | INTRAMUSCULAR | Status: DC
  Administered 2016-09-19 – 2016-09-23 (×8): 60 mg via INTRAVENOUS
  Filled 2016-09-19 (×8): qty 6

## 2016-09-19 MED ORDER — WARFARIN SODIUM 3 MG PO TABS
3.0000 mg | ORAL_TABLET | Freq: Once | ORAL | Status: AC
  Administered 2016-09-19: 3 mg via ORAL
  Filled 2016-09-19 (×2): qty 1

## 2016-09-19 MED ORDER — ARFORMOTEROL TARTRATE 15 MCG/2ML IN NEBU
15.0000 ug | INHALATION_SOLUTION | Freq: Two times a day (BID) | RESPIRATORY_TRACT | Status: DC
  Administered 2016-09-19 – 2016-09-23 (×8): 15 ug via RESPIRATORY_TRACT
  Filled 2016-09-19 (×9): qty 2

## 2016-09-19 MED ORDER — VANCOMYCIN HCL 10 G IV SOLR
2000.0000 mg | Freq: Once | INTRAVENOUS | Status: AC
  Administered 2016-09-19: 2000 mg via INTRAVENOUS
  Filled 2016-09-19: qty 2000

## 2016-09-19 MED ORDER — MORPHINE SULFATE (PF) 2 MG/ML IV SOLN
1.0000 mg | Freq: Four times a day (QID) | INTRAVENOUS | Status: DC | PRN
  Administered 2016-09-19: 1 mg via INTRAVENOUS
  Filled 2016-09-19: qty 1

## 2016-09-19 MED ORDER — POTASSIUM CHLORIDE 2 MEQ/ML IV SOLN
30.0000 meq | INTRAVENOUS | Status: AC
  Administered 2016-09-19 (×2): 30 meq via INTRAVENOUS
  Filled 2016-09-19 (×2): qty 15

## 2016-09-19 MED ORDER — BUDESONIDE 0.5 MG/2ML IN SUSP
0.5000 mg | Freq: Two times a day (BID) | RESPIRATORY_TRACT | Status: DC
  Administered 2016-09-19 – 2016-09-23 (×8): 0.5 mg via RESPIRATORY_TRACT
  Filled 2016-09-19 (×9): qty 2

## 2016-09-19 MED ORDER — PIPERACILLIN-TAZOBACTAM 3.375 G IVPB 30 MIN
3.3750 g | Freq: Once | INTRAVENOUS | Status: AC
  Administered 2016-09-19: 3.375 g via INTRAVENOUS
  Filled 2016-09-19: qty 50

## 2016-09-19 MED ORDER — PIPERACILLIN-TAZOBACTAM 3.375 G IVPB
3.3750 g | Freq: Three times a day (TID) | INTRAVENOUS | Status: DC
  Administered 2016-09-19 – 2016-09-21 (×6): 3.375 g via INTRAVENOUS
  Filled 2016-09-19 (×7): qty 50

## 2016-09-19 MED ORDER — VANCOMYCIN HCL 10 G IV SOLR
1500.0000 mg | INTRAVENOUS | Status: DC
  Administered 2016-09-20: 1500 mg via INTRAVENOUS
  Filled 2016-09-19: qty 1500

## 2016-09-19 MED ORDER — ALBUTEROL SULFATE (2.5 MG/3ML) 0.083% IN NEBU: 2.5000 mg | INHALATION_SOLUTION | RESPIRATORY_TRACT | Status: DC

## 2016-09-19 MED ORDER — HYDRALAZINE HCL 20 MG/ML IJ SOLN: 10.0000 mg | Freq: Three times a day (TID) | INTRAMUSCULAR | Status: DC | PRN

## 2016-09-19 MED ORDER — MORPHINE SULFATE (PF) 2 MG/ML IV SOLN
1.0000 mg | Freq: Once | INTRAVENOUS | Status: AC
  Administered 2016-09-19: 1 mg via INTRAVENOUS
  Filled 2016-09-19: qty 1

## 2016-09-19 MED ORDER — ALBUTEROL SULFATE (2.5 MG/3ML) 0.083% IN NEBU: 2.5000 mg | INHALATION_SOLUTION | RESPIRATORY_TRACT | Status: DC | PRN

## 2016-09-19 MED ORDER — METHYLPREDNISOLONE SODIUM SUCC 40 MG IJ SOLR
40.0000 mg | Freq: Four times a day (QID) | INTRAMUSCULAR | Status: DC
  Administered 2016-09-19: 40 mg via INTRAVENOUS
  Filled 2016-09-19: qty 1

## 2016-09-19 MED ORDER — FUROSEMIDE 10 MG/ML IJ SOLN
60.0000 mg | Freq: Once | INTRAMUSCULAR | Status: AC
  Administered 2016-09-19: 60 mg via INTRAVENOUS
  Filled 2016-09-19: qty 6

## 2016-09-19 MED ORDER — SODIUM CHLORIDE 0.9 % IV BOLUS (SEPSIS)
250.0000 mL | Freq: Once | INTRAVENOUS | Status: AC
  Administered 2016-09-19: 250 mL via INTRAVENOUS

## 2016-09-19 NOTE — Progress Notes (Signed)
eLink Physician-Brief Progress Note Patient Name: William Braun DOB: [birth year] MRN: [id number]   Date of Service  09/19/2016  HPI/Events of Note  P CCM consult   Patient known to have severe COPD, FEV1 of 1.7 or 49% of predicted, on home oxygen, presents with acute exacerbation of COPD. Also has OSA, OHS.   Initially was on BiPAP but weaned off 2 days ago. Was switched to nasal cannula. He had acute desaturation yesterday requiring higher oxygen. He was eventually placed on 15L high flow O2 as well as nonrebreather mask. Dyspnea persisted. ABG done last night showed pH 7.45, PCO2 56, PO2 64 and that is on BiPAP 80% FiO2. He improved some but had desaturation early morning which responded to morphine push.   Baseline chest x-ray did not reveal any infiltrate. Chest x-ray yesterday showed increased interstitial markings. He was diuresed with Lasix.  Patient also with CLL.  Currently patient seen, in mild distress. Complains of dyspnea but is tolerating the BiPAP. Blood pressure 110/71, heart rate 110, respiratory rate of 18, sats of 92% on BiPAP 16/8, 100% FiO2.   eICU Interventions  Continue BiPAP with current settings for now. RT is about to get a blood gas.   Will discontinue dulera and switch to pulmicort, brovana BID and inc duoneb to q4.   We'll switch steroids to IV.   We'll hold off further diuresis given azotemia, elevated BUN and creatinine.  Will ask PCCM team on ground to evaluate.      Intervention Category Evaluation Type: Other  [place] 09/19/2016, 4:18 AM

## 2016-09-19 NOTE — Consult Note (Signed)
Name: William Braun MRN: [id number] DOB: December 14, 1932    ADMISSION DATE:  09/14/2016 CONSULTATION DATE:  09/19/2016  REFERRING MD :  Dr. Algis Liming  CHIEF COMPLAINT:  Hypoxemia   HISTORY OF PRESENT ILLNESS:   83 y.o. 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  He was admitted with 2 days of SOB, DOE and found to be in hypoxemic respiratory failure.  He was treated with his home COPD regimen and prednisone and improved but then declined yesterday.  The patient is a poor historian and he presents a limited history.  He states that subjectively he does not feel worse.  He just notices his O2 saturations decrease.  He denies wheezing or other adventitial breath sounds, and denies subjective fevers, chills, chest pain, pleurisy, cough and sputum production.  The use of NIPPV does not subjectively improve his breathing but he does feel better with nebulizers.   PAST MEDICAL HISTORY :   has a past medical history of Atrial fibrillation ([place]); Cerebrovascular disease; CHF (congestive heart failure) ([facility]); CLL (chronic lymphocytic leukemia) ([place]) (11/17/2014); COPD (chronic obstructive pulmonary disease) ([place]); History of thrombocytopenia; Obesity; Osteoarthritis; Other and unspecified hyperlipidemia; Type II or unspecified type diabetes mellitus without mention of complication, not stated as uncontrolled; and Unspecified essential hypertension.  has a past surgical history that includes Knee arthroscopy w/ allograft impant and Vasectomy. Prior to Admission medications   Medication Sig Start Date End Date Taking? Authorizing Provider  albuterol (VENTOLIN HFA) 108 (90 BASE) MCG/ACT inhaler Inhale 2 puffs into the lungs every 6 (six) hours as needed for wheezing or shortness of breath.  07/23/15  Yes Chesley Mires, MD  amLODipine (NORVASC) 5 MG tablet Take 0.5 tablets (2.5 mg total) by mouth daily. 08/10/15  Yes Larey Dresser, MD  budesonide-formoterol [facility]) 160-4.5 MCG/ACT inhaler Inhale 2 puffs into the lungs 2 (two) times daily. 2 puffs twice daily 11/13/15  Yes Chesley Mires, MD  glimepiride (AMARYL) 2 MG tablet Take 4 mg by mouth daily.  06/23/15  Yes Historical Provider, MD  HYDROcodone-acetaminophen (VICODIN) 5-500 MG per tablet Take 2 tablets by mouth every 6 (six) hours as needed for pain.    Yes Historical Provider, MD  ipratropium-albuterol (DUONEB) 0.5-2.5 (3) MG/3ML SOLN Take 3 mLs by nebulization every 6 (six) hours as needed. 03/31/16  Yes Magdalen Spatz, NP  metFORMIN (GLUCOPHAGE) 500 MG tablet Take 1,000 mg by mouth 2 (two) times daily with a meal.  07/20/11  Yes Historical Provider, MD  metolazone (ZAROXOLYN) 2.5 MG tablet Metolazone 2.5 mg once every Monday Wednesday & Friday mornings 04/18/16  Yes Larey Dresser, MD  metoprolol succinate (TOPROL-XL) 25 MG 24 hr tablet Take 1/2 tab by mouth two times a day   Yes Historical Provider, MD  nitroGLYCERIN (NITROSTAT) 0.4 MG SL tablet Place 1 tablet (0.4 mg total) under the tongue every 5 (five) minutes as needed for chest pain. 08/03/11  Yes Liliane Shi, PA-C  OXYGEN Inhale 3 L/min into the lungs.   Yes Historical Provider, MD  potassium chloride (K-DUR) 10 MEQ tablet Monday Wednesday Friday Take 40 meq(4 tabs) in am and 20 meq(2 tabs) in pm; Saturday Sunday Tuesday Thursday Take 20 meq(2 tabs) twice daily 04/18/16  Yes Larey Dresser, MD  simvastatin (ZOCOR) 20 MG tablet Take 20 mg  by mouth at bedtime.     Yes Historical Provider, MD  tiotropium (SPIRIVA) 18 MCG inhalation capsule Place 1 capsule (18 mcg total) into inhaler and inhale daily. 11/13/15  Yes Chesley Mires, MD  torsemide (DEMADEX) 20 MG tablet Take 3 tablets (60 mg total) by mouth 2 (two) times daily. 06/21/16  Yes Larey Dresser, MD  traZODone (DESYREL) 50  MG tablet Take 0.5 tablets (25 mg total) by mouth at bedtime as needed for sleep. 06/21/16  Yes Larey Dresser, MD  warfarin (COUMADIN) 3 MG tablet Take 3-6 mg by mouth as directed. 2 tabs by mouth on sat, sun,  1.5 tabs wed, all other days 1 tablet   Yes Historical Provider, MD  silver sulfADIAZINE (SILVADENE) 1 % cream Apply 1 application topically daily. Patient not taking: Reported on 09/14/2016 06/15/15   Trula Slade, DPM   Allergies  Allergen Reactions  . Quinine     Seizure     FAMILY HISTORY:  family history includes Heart disease in his father. SOCIAL HISTORY:  reports that he quit smoking about 38 years ago. His smoking use included Cigarettes. He started smoking about 70 years ago. He has a 68.00 pack-year smoking history. He has never used smokeless tobacco. He reports that he does not drink alcohol.  REVIEW OF SYSTEMS:   Constitutional: Negative for fever, chills, weight loss, malaise/fatigue and diaphoresis.  HENT: Negative for hearing loss, ear pain, nosebleeds, congestion, sore throat, neck pain, tinnitus and ear discharge.   Eyes: Negative for blurred vision, double vision, photophobia, pain, discharge and redness.  Respiratory: Negative for cough, hemoptysis, sputum production, shortness of breath, wheezing and stridor.   Cardiovascular: Negative for chest pain, palpitations, orthopnea, claudication, leg swelling and PND.  Gastrointestinal: Negative for heartburn, nausea, vomiting, abdominal pain, diarrhea, constipation, blood in stool and melena.  Genitourinary: Negative for dysuria, urgency, frequency, hematuria and flank pain.  Musculoskeletal: Negative for myalgias, back pain, joint pain and falls.  Skin: Negative for itching and rash.  Neurological: Negative for dizziness, tingling, tremors, sensory change, speech change, focal weakness, seizures, loss of consciousness, weakness and headaches.  Endo/Heme/Allergies: Negative for environmental allergies and  polydipsia. Does not bruise/bleed easily.  SUBJECTIVE:   VITAL SIGNS: Temp:  [97.4 F (36.3 C)-98.8 F (37.1 C)] 97.4 F (36.3 C) (12/31 2327) Pulse Rate:  [78-102] 88 (01/01 0203) Resp:  [12-32] 17 (01/01 0203) BP: (103-143)/(44-80) 143/80 (12/31 2327) SpO2:  [85 %-98 %] 89 % (01/01 0203) FiO2 (%):  [80 %-100 %] 100 % (01/01 0203)  PHYSICAL EXAMINATION: General:  NAD, AAOx3 Neuro:  CN II - XII intact HEENT:  Poor dentition, PERRLA, dry MM Cardiovascular:  RRR, S1/s2 no m/r/g Lungs:  Deminished breath sound on the right, mild rales LLL.  No wheezing or Rhonchi Abdomen:  Soft, NT/ND, normal bowel sounds Musculoskeletal:  Normal bulk and tone Skin:  No c/c/e   Recent Labs Lab 09/16/16 0442 09/17/16 0550 09/18/16 0412  NA 136 136 138  K 2.9* 3.1* 2.9*  CL 80* 83* 87*  CO2 39* 35* 38*  BUN 118* 131* 115*  CREATININE 2.43* 2.23* 1.86*  GLUCOSE 243* 253* 142*    Recent Labs Lab 09/14/16 1857 09/16/16 0442  HGB 15.6 15.4  HCT 45.1 45.3  WBC 33.4* 38.2*  PLT 146* 179   Dg Chest Port 1 View  Result Date: 09/18/2016 CLINICAL DATA:  COPD exacerbation EXAM: PORTABLE CHEST 1 VIEW COMPARISON:  September 14, 2016 FINDINGS: The heart size and mediastinal contours  are stable. The heart size is enlarged. The aorta is tortuous. There is pulmonary edema. There is no focal pneumonia or pleural effusion. Elevation of right hemidiaphragm is unchanged. The visualized skeletal structures are stable. IMPRESSION: Cardiomegaly with pulmonary edema. Electronically Signed   By: Abelardo Diesel M.D.   On: 09/18/2016 15:32   Dg Chest Port 1 View  Result Date: 09/19/2016 CLINICAL DATA:  Hypoxia. EXAM: PORTABLE CHEST 1 VIEW COMPARISON:  Chest radiograph September 18, 2016 FINDINGS: The cardiac silhouette appears mildly enlarged. Mediastinal silhouette is nonsuspicious. Similar pulmonary vascular congestion. Persistently elevated RIGHT hemidiaphragm. Patchy LEFT lung base airspace opacity. No  pneumothorax. Soft tissue planes and included osseous structures are nonsuspicious. IMPRESSION: Similar cardiomegaly and pulmonary vascular congestion. LEFT lung base atelectasis or pneumonia. Electronically Signed   By: Elon Alas M.D.   On: 09/19/2016 05:11    ASSESSMENT / PLAN:  84 yo male with Acute on chronic hypoxemic respiratory failure.  Based on the patient's last PFTs he has obstructive and restrictive lung disease.  His restriction is likely 2/2 right elevated hemidiaphragm and CHF.  Currently, given his lack of wheezing and ABG that demonstrates an acute respiratory alkalosis on chronic hypercapnia, I do not think his current hypoxemic respiratory failure is due to COPD exacerbation.  He does have CHF and pulmonary hypertension and his BUN/Cr have been improving with diuresis so pulmonary edema/ pulmonary hypertension is plausible explanation.  Additionally, he appears to have a new LLL infiltrate on his CT chest and a WBC that is elevated compared to usual for him.  Finally, given his therapeutic INR, a pulmonary embolism would be highly unlikely (though PEs have been documented in patients on warfarin).  In conclusion it would appear that his worsening hypoxemia is likely multifactorial and includes CHF, atalectasis, and possibly HCAP in a patient with already compromised pulmonary function (paralyzed hemidiaphragm and COPD).  Recommend: - d/c methylpred - change COPD meds to nebs - Duonebs Q4hrs scheduled - continued diuresis - 60mg  lasix IV now was ordered - CT chest without contrast to further elucidate pulmonary parenchyma - continue BiPAP for now - although he is on 100% FiO2, this should be weaned, no current indication for intubation. - No CTPE given therapeutic INR and elevated Scr - If PE remains in the ddx - recommend LE duplex  Total critical care time: 30 min  Critical care time was exclusive of separately billable procedures and treating other  patients.  Critical care was necessary to treat or prevent imminent or life-threatening deterioration.  Critical care was time spent personally by me on the following activities: development of treatment plan with patient and/or surrogate as well as nursing, discussions with consultants, evaluation of patient's response to treatment, examination of patient, obtaining history from patient or surrogate, ordering and performing treatments and interventions, ordering and review of laboratory studies, ordering and review of radiographic studies, pulse oximetry and re-evaluation of patient's condition.   Meribeth Mattes, DO., MS [facility]    P[facility] and [place] Pager: [phone]  09/19/2016, 5:46 AM

## 2016-09-19 NOTE — Progress Notes (Signed)
Transported pt on BIPAP to CT with RN

## 2016-09-19 NOTE — Progress Notes (Signed)
  Echocardiogram 2D Echocardiogram has been performed.  Darlina Sicilian M 09/19/2016, 9:04 AM

## 2016-09-19 NOTE — Progress Notes (Signed)
Pharmacy Antibiotic Note  William Braun is a 83 y.o. male admitted on 09/14/2016 with acute on chronic respiratory failure, now w/ concern for PNA w/ CXR that has changed since yesterday w/ left lung base atelectasis vs PNA.  Pharmacy has been consulted for Vancocin and Zosyn dosing.  Plan: Vancomycin 2000mg  x1 then 1500mg  IV every 24 hours.  Goal trough 15-20 mcg/mL. Zosyn 3.375g IV q8h (4 hour infusion).  Height: 6\' 3"  (190.5 cm) Weight: 275 lb 2.2 oz (124.8 kg) IBW/kg (Calculated) : 84.5  Temp (24hrs), Avg:98 F (36.7 C), Min:97.4 F (36.3 C), Max:98.8 F (37.1 C)   Recent Labs Lab 09/14/16 1857 09/16/16 0442 09/17/16 0550 09/18/16 0412  WBC 33.4* 38.2*  --   --   CREATININE 2.49* 2.43* 2.23* 1.86*    Estimated Creatinine Clearance: 44.3 mL/min (by C-G formula based on SCr of 1.86 mg/dL (H)).    Allergies  Allergen Reactions  . Quinine     Seizure      Thank you for allowing pharmacy to be a part of this patient's care.  Wynona Neat, PharmD, BCPS  09/19/2016 5:28 AM

## 2016-09-19 NOTE — Progress Notes (Signed)
Called to bedside to evaluate William Braun with concern for worsening respiratory status despite escalation from high flow William Braun to BiPAP. With poor saturations on BiPAP, FiO2 has been titrated up, now to 100% with saturations holding at ~90%. There was concern for acute CHF last night and he responded well to 60 mg IVP William Braun, putting out 3 L, but unfortunately, respiratory status continued to decline.   On exam, pt appears fatigued, no pallor or cyanosis. Breath sounds mildly diminished b/l without rhonchi, crackles or wheeze.   Given concern for continued worsening despite escalation of respiratory support, PCCM was contacted and kindly agrees to evaluate patient.

## 2016-09-19 NOTE — Progress Notes (Signed)
PROGRESS NOTE  William Braun  [id number] DOB: [birth year]  DOA: 09/14/2016 PCP: Ileana Roup, MD   Brief Narrative:  83 year old male with PMH of chronic hypoxic respiratory failure on home oxygen 3 L/m continuously, OSA on nightly CPAP, HTN, HLD, DM, CLL, chronic diastolic CHF, atrial fibrillation on Coumadin, stroke, stage III chronic kidney disease, presented to ED with 2 days history of worsening dyspnea, minimal dry cough but no chest pain, fever or chills. In the ED, found to be hypoxic at 83% on 2 L/m oxygen, briefly on BiPAP in ED. Admitted to stepdown for possible COPD exacerbation. Patient had gradually improved until the afternoon of 12/31 when noted progressively worsening dyspnea. CCM consulted. Back on BiPAP. Remains in stepdown until further improvement and stabilization.   Assessment & Plan:   Principal Problem:   Acute on chronic respiratory failure with hypoxia (HCC) Active Problems:   HLD (hyperlipidemia)   Essential hypertension   ATRIAL FIBRILLATION   COPD exacerbation (HCC)   Chronic diastolic CHF (congestive heart failure) (HCC)   CLL (chronic lymphocytic leukemia) (HCC)   Type II diabetes mellitus with neurological manifestations (HCC)   Elevated troponin   Hypokalemia   Acute renal failure superimposed on stage 3 chronic kidney disease ([place])   1. COPD exacerbation: Briefly required BiPAP in ED. Treated with oxygen, bronchodilator nebulizations, IV Solu-Medrol. No antibiotics initially initiated due to nonproductive minimal cough. Influenza panel PCR A&B: Negative. Urinary streptococcal antigen: Negative. Former smoker. Patient had gradually improved until the afternoon of 12/31 when noted progressively worsening dyspnea. CCM consultation appreciated and did not think that his current acute hypoxic respiratory failure is due to COPD exacerbation. Steroids were thereby discontinued. 2. Acute on chronic respiratory failure with hypoxia: On home oxygen 3 L/m.  Hypoxic in ED at 83% on 2 L. Briefly required BiPAP in ED. Management as per problem #1. Monitor closely. Low index of suspicion for PE while therapeutic on Coumadin. Wean oxygen to maintain saturations between 89-92 percent. Patient had gradually improved until the afternoon of 12/31 when noted progressively worsening dyspnea. CCM consultation appreciated. Suspect acute respiratory failure is multifactorial: Decompensated CHF, atelectasis and possible healthcare associated pneumonia in patient with already compromised pulmonary function (paralyzed hemidiaphragm and COPD). Steroids discontinued. Adjusted COPD meds and Duonebs, continued IV diuresis, BiPAP. Follow-up CT chest without contrast to further define pulmonary parenchyma. No CTA chest given therapeutic INR, elevated creatinine and low index of suspicion for PE. 3. Acute on Chronic diastolic CHF: LVEF [id number] by echo 07/30/15. Continued metoprolol. Clinically had not appeared significantly volume overloaded and due to elevated creatinine from his baseline, diuretics had been held for approximately 2 days. Follow-up chest x-ray 12/31 and clinically suggested worsening pulmonary edema and patient was initiated on IV Lasix. Continue IV Lasix. Follow repeat 2-D echo. 4. Healthcare associated pneumonia, LLL: Started IV vancomycin and Zosyn per pharmacy. Will need repeat chest x-ray to ensure resolution of pneumonia findings. 5. Type II DM: A1c 6.5 on 11/07/08. Holding by mouth meds. Continue SSI. Added low-dose Lantus due to worsening hyperglycemia while on steroids. Taper steroids as soon as possible. Repeat A1c: 8.1. Changed steroids to by mouth which should help. Increased Lantus to 20 units daily. Monitor closely. Still had significantly elevated CBGs in the 240-345 range on 12/31. 6. HLD: Continue Zocor. 7. HTN: Continue amlodipine and metoprolol. Controlled. 8. Atrial fibrillation: CHA2DS2-VASc Score is 7. INR therapeutic/3.18 on 09/19/16. Continue  Coumadin per pharmacy. Continue metoprolol. INR therapeutic. 9. Hypokalemia: Replace aggressively. Follow BMP in  a.m. Magnesium 2.5. 10. Acute on stage III chronic kidney disease: Baseline creatinine 1.6-1.8. Admitting creatinine 2.49. Likely due to diuretics and dehydration. Temporarily held diuretics. Creatinine has improved to 1.6. Creatinine back at baseline but follow closely while aggressively being diuresed. 11. CLL: Follows with outpatient oncology. Not on any medications. Worsening leukocytosis may be steroid effect. 12. Elevated troponin: Flat trend. No chest pain. Likely demand ischemia from acute respiratory issues and renal insufficiency. Continue when necessary NTG, statins and metoprolol. Follow repeat 2-D echo of 09/19/16. 13. Left knee pain: No acute findings on the exam. X-ray suggestive of osteoarthritis. Supportive treatment. Last outpatient, chronic symptoms for the last 6-8 months. Has seen orthopedics for right knee replacement in past and can follow-up outpatient. 14. NSVT: 17 beat NSVT noted on telemetry on 12/31. Asymptomatic. Continue telemetry. Replace potassium. Continue metoprolol. Follow 2-D echo results. Likely precipitated by acute respiratory issues.   DVT prophylaxis: Anticoagulated on Coumadin Code Status: Full Family Communication: None at bedside Disposition Plan: Admitted to step down. Continue close monitoring and management in stepdown unit given decline over the last 24 hours.   Consultants:   CCM  Procedures:   BiPAP  Antimicrobials:   IV Zosyn 1/1 >  IV vancomycin 1/1 >   Subjective: Overnight events noted. Patient had to go back on BiPAP due to worsening dyspnea and hypoxia. States that his breathing is slightly better. No other symptoms reported. No chest pain.   Objective:  Vitals:   09/19/16 0757 09/19/16 0800 09/19/16 1046 09/19/16 1105  BP:    109/75  Pulse:  97  (!) 104  Resp:  (!) 23  (!) 22  Temp:    98.1 F (36.7 C)    TempSrc:    Oral  SpO2: 98% 98% 95% 90%  Weight:      Height:        Intake/Output Summary (Last 24 hours) at 09/19/16 1109 Last data filed at 09/19/16 [id number]  Gross per 24 hour  Intake              890 ml  Output             2475 ml  Net            -1585 ml   Filed Weights   09/15/16 1654  Weight: 124.8 kg (275 lb 2.2 oz)    Examination:  General exam: Pleasant elderly male, moderately built and obese, Lying propped up in bed with mild respiratory distress. Has BiPAP on. Respiratory system: Definitely worse compared to 12/31 morning. Decreased breath sounds bilaterally with scattered bilateral few medium pitched expiratory rhonchi and few basal crackles. Mild increased work of breathing. Cardiovascular system: S1 & S2 heard, RRR. No JVD, murmurs, rubs, gallops or clicks. No pedal edema. Telemetry: Paroxysmal A. fib with controlled ventricular rate. 17 beat NSVT noted on 09/18/16.  Gastrointestinal system: Abdomen is nondistended, soft and nontender. No organomegaly or masses felt. Normal bowel sounds heard. Central nervous system: Alert and oriented. No focal neurological deficits. Extremities: Symmetric 5 x 5 power. Left knee with boggy chronic swelling without acute findings. Right knee with surgical scar. Skin: No rashes, lesions or ulcers Psychiatry: Judgement and insight appear normal. Mood & affect appropriate.     Data Reviewed: I have personally reviewed following labs and imaging studies  CBC:  Recent Labs Lab 09/14/16 1857 09/16/16 0442 09/19/16 0636  WBC 33.4* 38.2* 49.4*  NEUTROABS 16.4*  --   --   HGB 15.6 15.4 17.5*  HCT 45.1 45.3 51.2  MCV 89.8 90.1 89.8  PLT 146* 179 [id number]   Basic Metabolic Panel:  Recent Labs Lab 09/14/16 1857 09/14/16 2250 09/16/16 0442 09/17/16 0550 09/18/16 0412 09/19/16 0636  NA 134*  --  136 136 138 144  K 3.1*  --  2.9* 3.1* 2.9* 3.1*  CL 82*  --  80* 83* 87* 88*  CO2 32  --  39* 35* 38* 40*  GLUCOSE 192*  --  243*  253* 142* 92  BUN 101*  --  118* 131* 115* 88*  CREATININE 2.49*  --  2.43* 2.23* 1.86* 1.69*  CALCIUM 9.2  --  9.4 9.7 9.4 9.9  MG  --  2.3  --   --   --  2.5*   GFR: Estimated Creatinine Clearance: 48.8 mL/min (by C-G formula based on SCr of 1.69 mg/dL (H)). Liver Function Tests: No results for input(s): AST, ALT, ALKPHOS, BILITOT, PROT, ALBUMIN in the last 168 hours. No results for input(s): LIPASE, AMYLASE in the last 168 hours. No results for input(s): AMMONIA in the last 168 hours. Coagulation Profile:  Recent Labs Lab 09/15/16 0434 09/16/16 0442 09/17/16 0550 09/18/16 0412 09/19/16 0636  INR 2.51 2.54 2.36 2.36 3.18   Cardiac Enzymes:  Recent Labs Lab 09/14/16 1857 09/14/16 2250 09/15/16 0434 09/15/16 1029 09/19/16 0636  TROPONINI 0.03* 0.06* 0.04* 0.03* 0.05*   BNP (last 3 results) No results for input(s): PROBNP in the last 8760 hours. HbA1C: No results for input(s): HGBA1C in the last 72 hours. CBG:  Recent Labs Lab 09/18/16 0833 09/18/16 1259 09/18/16 1731 09/18/16 2114 09/19/16 0716  GLUCAP 119* 240* 345* 348* 116*   Lipid Profile: No results for input(s): CHOL, HDL, LDLCALC, TRIG, CHOLHDL, LDLDIRECT in the last 72 hours. Thyroid Function Tests: No results for input(s): TSH, T4TOTAL, FREET4, T3FREE, THYROIDAB in the last 72 hours. Anemia Panel: No results for input(s): VITAMINB12, FOLATE, FERRITIN, TIBC, IRON, RETICCTPCT in the last 72 hours.  Sepsis Labs: No results for input(s): PROCALCITON, LATICACIDVEN in the last 168 hours.  Recent Results (from the past 240 hour(s))  MRSA PCR Screening     Status: None   Collection Time: 09/15/16  8:05 PM  Result Value Ref Range Status   MRSA by PCR NEGATIVE NEGATIVE Final    Comment:        The GeneXpert MRSA Assay (FDA approved for NASAL specimens only), is one component of a comprehensive MRSA colonization surveillance program. It is not intended to diagnose MRSA infection nor to guide  or monitor treatment for MRSA infections.          Radiology Studies: Dg Chest Port 1 View  Result Date: 09/18/2016 CLINICAL DATA:  COPD exacerbation EXAM: PORTABLE CHEST 1 VIEW COMPARISON:  September 14, 2016 FINDINGS: The heart size and mediastinal contours are stable. The heart size is enlarged. The aorta is tortuous. There is pulmonary edema. There is no focal pneumonia or pleural effusion. Elevation of right hemidiaphragm is unchanged. The visualized skeletal structures are stable. IMPRESSION: Cardiomegaly with pulmonary edema. Electronically Signed   By: Abelardo Diesel M.D.   On: 09/18/2016 15:32   Dg Chest Port 1 View  Result Date: 09/19/2016 CLINICAL DATA:  Hypoxia. EXAM: PORTABLE CHEST 1 VIEW COMPARISON:  Chest radiograph September 18, 2016 FINDINGS: The cardiac silhouette appears mildly enlarged. Mediastinal silhouette is nonsuspicious. Similar pulmonary vascular congestion. Persistently elevated RIGHT hemidiaphragm. Patchy LEFT lung base airspace opacity. No pneumothorax. Soft tissue planes and included osseous structures are  nonsuspicious. IMPRESSION: Similar cardiomegaly and pulmonary vascular congestion. LEFT lung base atelectasis or pneumonia. Electronically Signed   By: Elon Alas M.D.   On: 09/19/2016 05:11        Scheduled Meds: . albuterol  2.5 mg Nebulization Q4H  . amLODipine  2.5 mg Oral Daily  . arformoterol  15 mcg Nebulization BID  . budesonide (PULMICORT) nebulizer solution  0.5 mg Nebulization BID  . dextromethorphan-guaiFENesin  1 tablet Oral BID  . feeding supplement (GLUCERNA SHAKE)  237 mL Oral TID BM  . furosemide  60 mg Intravenous BID  . insulin aspart  0-15 Units Subcutaneous TID WC  . insulin aspart  0-5 Units Subcutaneous QHS  . insulin glargine  20 Units Subcutaneous Daily  . ipratropium-albuterol  3 mL Nebulization Q4H  . metoprolol succinate  12.5 mg Oral Daily  . piperacillin-tazobactam (ZOSYN)  IV  3.375 g Intravenous Q8H  .  potassium chloride  40 mEq Oral TID  . senna-docusate  1 tablet Oral BID  . simvastatin  20 mg Oral QHS  . sodium chloride flush  3 mL Intravenous Q12H  . [START ON 09/20/2016] vancomycin  1,500 mg Intravenous Q24H  . warfarin  3 mg Oral ONCE-1800  . Warfarin - Pharmacist Dosing Inpatient   Does not apply q1800   Continuous Infusions:   LOS: 5 days      [facility], MD [facility] Pager [phone]  If 7PM-7AM, please contact night-coverage [email or web address] Password [email or web address] 09/19/2016, 11:09 AM

## 2016-09-19 NOTE — Progress Notes (Signed)
ANTICOAGULATION CONSULT NOTE  Pharmacy Consult for warfarin Indication: atrial fibrillation  Patient Measurements: Height: 6\' 3"  (190.5 cm) Weight: 275 lb 2.2 oz (124.8 kg) IBW/kg (Calculated) : 84.5  Vital Signs: Temp: 97.7 F (36.5 C) (01/01 0700) Temp Source: Axillary (01/01 0700) BP: 110/64 (01/01 0700) Pulse Rate: 97 (01/01 0800)  Labs:  Recent Labs  09/17/16 0550 09/18/16 0412 09/19/16 0636  HGB  --   --  17.5*  HCT  --   --  51.2  PLT  --   --  198  LABPROT 26.3* 26.3* 33.3*  INR 2.36 2.36 3.18  CREATININE 2.23* 1.86* 1.69*  TROPONINI  --   --  0.05*    Estimated Creatinine Clearance: 48.8 mL/min (by C-G formula based on SCr of 1.69 mg/dL (H)).   Assessment: 83yo M to continue on PTA warfarin for h/o afib. INR slightly supratherapeutic at 3.18 today, CBC stable, no bleeding documented.  PTA Dose: 6mg  Sat/Sun, 4.5mg  Wed, 3mg  Mon/Tues/Thurs/Fri  Goal of Therapy:  INR 2-3 Monitor platelets by anticoagulation protocol: Yes   Plan:  -Warfarin 3 mg PO tonight (to continue home regimen) -Daily INR -Monitor CBC, s/sx of bleeding   Gwenlyn Perking, PharmD PGY1 Pharmacy Resident Pager: [phone] 09/19/2016 10:39 AM

## 2016-09-19 NOTE — Progress Notes (Signed)
Pt Pox is 85% on high flow o2 on 15 L. Pt placed on a non re breather and Pox improved to 90%. Pt is still awake oriented times 4. Pt is in mild respiratory distress. Respiratory therapy was called and MD was paged. The pt was placed on Bi pap by RT and standing orders. Md returned page and gave orders. Will continue to monitor pt.

## 2016-09-19 NOTE — Progress Notes (Signed)
Addendum  As per RN report, patient was taken off of BiPAP for 30 minutes, saturating 90% on 8 L HFNC. Tried pills and he apparently choked and coughed pill up. As per discussion with RN, holding by mouth's for now, reassess ability to swallow in a couple of hours and determine if formal swallowing evaluation is needed (had been doing well with by mouth's until this morning), changed potassium supplements to IV, recheck BMP post IV replacement.  Vernell Leep, MD, FACP, FHM. [facility] Pager [phone]  If 7PM-7AM, please contact night-coverage [email or web address] Password [email or web address] 09/19/2016, 11:39 AM

## 2016-09-20 DIAGNOSIS — J9621 Acute and chronic respiratory failure with hypoxia: Principal | ICD-10-CM

## 2016-09-20 LAB — CBC
HCT: 51.5 % (ref 39.0–52.0)
Hemoglobin: 16.6 g/dL (ref 13.0–17.0)
MCH: 30.5 pg (ref 26.0–34.0)
MCHC: 32.2 g/dL (ref 30.0–36.0)
MCV: 94.7 fL (ref 78.0–100.0)
PLATELETS: 167 10*3/uL (ref 150–400)
RBC: 5.44 MIL/uL (ref 4.22–5.81)
RDW: 16.8 % — AB (ref 11.5–15.5)
WBC: 38 10*3/uL — AB (ref 4.0–10.5)

## 2016-09-20 LAB — BASIC METABOLIC PANEL
ANION GAP: 14 (ref 5–15)
BUN: 87 mg/dL — ABNORMAL HIGH (ref 6–20)
CO2: 41 mmol/L — ABNORMAL HIGH (ref 22–32)
Calcium: 9.7 mg/dL (ref 8.9–10.3)
Chloride: 94 mmol/L — ABNORMAL LOW (ref 101–111)
Creatinine, Ser: 2.17 mg/dL — ABNORMAL HIGH (ref 0.61–1.24)
GFR calc Af Amer: 31 mL/min — ABNORMAL LOW (ref >60)
GFR, EST NON AFRICAN AMERICAN: 27 mL/min — AB (ref >60)
GLUCOSE: 121 mg/dL — AB (ref 65–99)
Potassium: 3.1 mmol/L — ABNORMAL LOW (ref 3.5–5.1)
Sodium: 149 mmol/L — ABNORMAL HIGH (ref 135–145)

## 2016-09-20 LAB — GLUCOSE, CAPILLARY
GLUCOSE-CAPILLARY: 224 mg/dL — AB (ref 65–99)
Glucose-Capillary: 126 mg/dL — ABNORMAL HIGH (ref 65–99)
Glucose-Capillary: 224 mg/dL — ABNORMAL HIGH (ref 65–99)
Glucose-Capillary: 116 mg/dL — ABNORMAL HIGH (ref 65–99)

## 2016-09-20 LAB — PROTIME-INR
INR: 4.99 — AB
PROTHROMBIN TIME: 47.7 s — AB (ref 11.4–15.2)

## 2016-09-20 MED ORDER — IPRATROPIUM-ALBUTEROL 0.5-2.5 (3) MG/3ML IN SOLN: 3.0000 mL | RESPIRATORY_TRACT | Status: DC | PRN

## 2016-09-20 MED ORDER — IPRATROPIUM-ALBUTEROL 0.5-2.5 (3) MG/3ML IN SOLN
3.0000 mL | Freq: Three times a day (TID) | RESPIRATORY_TRACT | Status: DC
  Administered 2016-09-21 – 2016-09-23 (×7): 3 mL via RESPIRATORY_TRACT
  Filled 2016-09-20 (×8): qty 3

## 2016-09-20 NOTE — Progress Notes (Signed)
PROGRESS NOTE  William Braun  [id number] DOB: Aug 08, 1933  DOA: 09/14/2016 PCP: Ileana Roup, MD   Brief Narrative:  58 ?  PMH of chronic hypoxic respiratory failure copd on home oxygen 3 L/m continuously,  OSA Rt hemidiaphragm paralysis. on nightly BiPAP 14/10 cm H2O  HTN,  HLD,  DM,  CLL,  chronic r sided diastolic CHF,  Chr. atrial fibrillation CHad2Vasc2 score= 5 on Coumadin,  Lexiscan myoview (11/12) with EF 65% Prior Stroke 2008  stage III chronic kidney disease, presented to ED with 2 days history of worsening dyspnea, minimal dry cough but no chest pain, fever or chills.   In the ED, found to be hypoxic at 83% on 2 L/m oxygen, briefly on BiPAP in ED. Admitted to stepdown for possible COPD exacerbation.  Patient had gradually improved until the afternoon of 12/31 when noted progressively worsening dyspnea. CCM consulted. Back on BiPAP.  Remains in stepdown until further improvement and stabilization. slp consulted given coughing and choking on pills   Assessment & Plan:   Principal Problem:   Acute on chronic respiratory failure with hypoxia (HCC) Active Problems:   HLD (hyperlipidemia)   Essential hypertension   ATRIAL FIBRILLATION   COPD exacerbation (HCC)   Chronic diastolic CHF (congestive heart failure) (HCC)   CLL (chronic lymphocytic leukemia) (HCC)   Type II diabetes mellitus with neurological manifestations (HCC)   Elevated troponin   Hypokalemia   Acute renal failure superimposed on stage 3 chronic kidney disease ([place])   1. COPD exacerbation: Briefly required BiPAP in ED. Treated with oxygen, bronchodilator nebulizations, IV Solu-Medrol. No antibiotics initially initiated due to nonproductive minimal cough. Influenza panel PCR A&B: Negative. Urinary streptococcal antigen: Negative. Former smoker. Patient had gradually improved until the afternoon of 12/31 when noted progressively worsening dyspnea. CT chest showed probable  multifocal  pneumonia--currently on IV Zosyn 2. Acute on chronic respiratory failure with hypoxia: On home oxygen 3 L/m. Hypoxic in ED at 83% on 2 L. Briefly required BiPAP in ED. Management as per problem #1. Monitor closely. Low index of suspicion for PE while therapeutic on Coumadin. Wean oxygen to maintain saturations between 89-92 percent. Patient had gradually improved until the afternoon of 12/31 -acute respiratory failure is multifactorial: Decompensated CHF, atelectasis and possible healthcare associated pneumonia combination/ already compromised pulmonary function (paralyzed hemidiaphragm and COPD). Steroids discontinued. Adjusted COPD meds and Duonebs, continued IV diuresis, BiPAP.  3. Acute on Chronic diastolic CHF: 2-D echo [id number] = Pasp 56 EF 55-60%Continued metoprolol. Clinically had not appeared significantly volume overloaded and due to elevated creatinine from his baseline, diuretics had been held for approximately 2 days. Follow-up chest x-ray 12/31 and clinically suggested worsening pulmonary edema and patient was initiated on IV Lasix. Continue IV Lasix. Follow repeat 2-D echo. Is currently -5.9 L 4. Healthcare associated pneumonia, LLL: Started IV vancomycin and Zosyn per pharmacy. Will need repeat chest x-ray to ensure resolution of pneumonia findings. 5. Hypernatremia-with upward trend since 09/19/2016-might need to increase free water. Awaiting speech therapy input 6. Type II DM: A1c 6.5 on 11/07/08. Holding by mouth meds. Continue SSI. Added low-dose Lantus due to worsening hyperglycemia while on steroids. Taper steroids as soon as possible. Repeat A1c: 8.1. Changed steroids to by mouth which should help. Increased Lantus to 20 units daily. Monitor closely. CBGs in the 120 range 7. HLD: Continue Zocor. 8. HTN: Continue amlodipine 2.5 and metoprolol 12.5. Controlled. 9. Atrial fibrillation: CHA2DS2-VASc Score is 7. INR therapeutic/3.18 on 09/19/16. Continue Coumadin per pharmacy. Continue  metoprolol. INR 4.99 on 09/20/2016 and could be secondary to interaction with Zosyn--we will follow labs in the morning. 10. Hypokalemia: Replace aggressively with IV replacement. Follow BMP in a.m. Magnesium to repeat with next blood 11. Acute on stage III chronic kidney disease: Baseline creatinine 1.6-1.8. Admitting creatinine 2.49. Likely due to diuretics and dehydration. Temporarily held diuretics. Creatinine being monitored 12. CLL: Follows with outpatient oncology. Not on any medications. Worsening leukocytosis may be steroid effect. 13. Elevated troponin: Flat trend. No chest pain. Likely demand ischemia from acute respiratory issues and renal insufficiency. Continue when necessary NTG, statins and metoprolol. Follow repeat 2-D echo of 09/19/16. 14. Left knee pain: No acute findings on the exam. X-ray suggestive of osteoarthritis. Supportive treatment. Last outpatient, chronic symptoms for the last 6-8 months. Has seen orthopedics for right knee replacement in past and can follow-up outpatient. 15. NSVT: 17 beat NSVT noted on telemetry on 12/31. Asymptomatic. Continue telemetry. Replace potassium. Continue metoprolol 12.5 XL. 2-D echo 09/19/16 = Pasp 56 EF 55-60%  DVT prophylaxis: Anticoagulated on Coumadin Code Status: Full Family Communication: None at bedside Disposition Plan: Admitted to step down. Continue close monitoring overnight and if stabilizing we will transfer to telemetry in the next day and a half   Consultants:   CCM  Procedures:   BiPAP  Antimicrobials:   IV Zosyn 1/1 >  IV vancomycin 1/1 >   Subjective:  Feels better. Still short of breath but has improved to some extent and overall feels close to 75% of his normal self No chest pain Very hungry and was nothing by mouth for some uncertain cause-is currently eating now   Objective:  Vitals:   09/19/16 1948 09/19/16 2237 09/19/16 2357 09/20/16 0333  BP: 108/88 134/87  120/82  Pulse: 81 92  (!) 107  Resp:  (!) 24 (!) 25  (!) 21  Temp: 98 F (36.7 C) 98.2 F (36.8 C)  98 F (36.7 C)  TempSrc: Oral Oral  Axillary  SpO2: 96% 90% 93% 90%  Weight:      Height:        Intake/Output Summary (Last 24 hours) at 09/20/16 0743 Last data filed at 09/20/16 [id number]  Gross per 24 hour  Intake               50 ml  Output             2250 ml  Net            -2200 ml   Filed Weights   09/15/16 1654  Weight: 124.8 kg (275 lb 2.2 oz)    Examination:  General exam: Pleasant elderly male, moderately built and obese, Lying propped up.   Respiratory system: Definitely worse compared to 12/31 morning. Decreased breath sounds bilaterally few basal crackles. Mild work of breathing. Cardiovascular system: S1 & S2 heard, RRR. No JVD, murmurs, rubs, gallops or clicks. No pedal edema. Telemetry: Paroxysmal A. fib with controlled ventricular rate.   Gastrointestinal system: Abdomen is nondistended, soft and nontender. No organomegaly or masses felt. Normal bowel sounds heard. Central nervous system: Alert and oriented. No focal neurological deficits. Extremities: Symmetric 5 x 5 power. Left knee not examined today Skin: No rashes, lesions or ulcers Psychiatry: Judgement and insight appear normal. Mood & affect appropriate.     Data Reviewed: I have personally reviewed following labs and imaging studies  CBC:  Recent Labs Lab 09/14/16 1857 09/16/16 0442 09/19/16 0636 09/20/16 0513  WBC 33.4* 38.2* 49.4* 38.0*  NEUTROABS  16.4*  --   --   --   HGB 15.6 15.4 17.5* 16.6  HCT 45.1 45.3 51.2 51.5  MCV 89.8 90.1 89.8 94.7  PLT 146* 179 198 [id number]   Basic Metabolic Panel:  Recent Labs Lab 09/14/16 2250  09/17/16 0550 09/18/16 0412 09/19/16 0636 09/19/16 1946 09/20/16 0513  NA  --   < > 136 138 144 146* 149*  K  --   < > 3.1* 2.9* 3.1* 3.8 3.1*  CL  --   < > 83* 87* 88* 94* 94*  CO2  --   < > 35* 38* 40* 39* 41*  GLUCOSE  --   < > 253* 142* 92 133* 121*  BUN  --   < > 131* 115* 88* 89* 87*    CREATININE  --   < > 2.23* 1.86* 1.69* 2.09* 2.17*  CALCIUM  --   < > 9.7 9.4 9.9 9.5 9.7  MG 2.3  --   --   --  2.5*  --   --   < > = values in this interval not displayed. GFR: Estimated Creatinine Clearance: 38 mL/min (by C-G formula based on SCr of 2.17 mg/dL (H)). Liver Function Tests: No results for input(s): AST, ALT, ALKPHOS, BILITOT, PROT, ALBUMIN in the last 168 hours. No results for input(s): LIPASE, AMYLASE in the last 168 hours. No results for input(s): AMMONIA in the last 168 hours. Coagulation Profile:  Recent Labs Lab 09/16/16 0442 09/17/16 0550 09/18/16 0412 09/19/16 0636 09/20/16 0513  INR 2.54 2.36 2.36 3.18 4.99*   Cardiac Enzymes:  Recent Labs Lab 09/14/16 1857 09/14/16 2250 09/15/16 0434 09/15/16 1029 09/19/16 0636  TROPONINI 0.03* 0.06* 0.04* 0.03* 0.05*   BNP (last 3 results) No results for input(s): PROBNP in the last 8760 hours. HbA1C: No results for input(s): HGBA1C in the last 72 hours. CBG:  Recent Labs Lab 09/18/16 2114 09/19/16 0716 09/19/16 1103 09/19/16 1517 09/19/16 2059  GLUCAP 348* 116* 194* 229* 119*   Lipid Profile: No results for input(s): CHOL, HDL, LDLCALC, TRIG, CHOLHDL, LDLDIRECT in the last 72 hours. Thyroid Function Tests: No results for input(s): TSH, T4TOTAL, FREET4, T3FREE, THYROIDAB in the last 72 hours. Anemia Panel: No results for input(s): VITAMINB12, FOLATE, FERRITIN, TIBC, IRON, RETICCTPCT in the last 72 hours.  Sepsis Labs: No results for input(s): PROCALCITON, LATICACIDVEN in the last 168 hours.  Recent Results (from the past 240 hour(s))  MRSA PCR Screening     Status: None   Collection Time: 09/15/16  8:05 PM  Result Value Ref Range Status   MRSA by PCR NEGATIVE NEGATIVE Final    Comment:        The GeneXpert MRSA Assay (FDA approved for NASAL specimens only), is one component of a comprehensive MRSA colonization surveillance program. It is not intended to diagnose MRSA infection nor to  guide or monitor treatment for MRSA infections.          Radiology Studies: Ct Chest Wo Contrast  Result Date: 09/19/2016 CLINICAL DATA:  Hypoxemia EXAM: CT CHEST WITHOUT CONTRAST TECHNIQUE: Multidetector CT imaging of the chest was performed following the standard protocol without IV contrast. COMPARISON:  Chest x-ray today. FINDINGS: Cardiovascular: Heart is borderline enlarged. Valvular, coronary artery and aortic calcifications. No aneurysm. Mediastinum/Nodes: Numerous mildly enlarged mediastinal lymph nodes. Pre tracheal lymph node has a short axis diameter of 18 mm. Right paratracheal lymph node has a short axis diameter of 16 mm. Mildly enlarged prevascular in AP window small  a subcarinal lymph nodes. Subcarinal lymph node has a short axis diameter of 18 mm. No axillary adenopathy. Difficult to assess the hila without intravenous contrast. Lungs/Pleura: Airspace disease noted in both lower lobes, left greater than right concerning for multifocal pneumonia. No pleural effusions. Upper Abdomen: Imaging into the upper abdomen shows no acute findings. Musculoskeletal: Chest wall soft tissues are unremarkable. No acute bony abnormality or focal bone lesion. IMPRESSION: Bilateral lower lobe airspace opacities, left greater than right concerning for multifocal pneumonia. Mediastinal adenopathy. This may be reactive, but recommend follow-up to ensure stability or improvement. Follow-up CT with intravenous contrast if possible could be performed in 3-6 months. Coronary artery disease, aortic atherosclerosis. Electronically Signed   By: Rolm Baptise M.D.   On: 09/19/2016 14:35   Dg Chest Port 1 View  Result Date: 09/18/2016 CLINICAL DATA:  COPD exacerbation EXAM: PORTABLE CHEST 1 VIEW COMPARISON:  September 14, 2016 FINDINGS: The heart size and mediastinal contours are stable. The heart size is enlarged. The aorta is tortuous. There is pulmonary edema. There is no focal pneumonia or pleural effusion.  Elevation of right hemidiaphragm is unchanged. The visualized skeletal structures are stable. IMPRESSION: Cardiomegaly with pulmonary edema. Electronically Signed   By: Abelardo Diesel M.D.   On: 09/18/2016 15:32   Dg Chest Port 1 View  Result Date: 09/19/2016 CLINICAL DATA:  Hypoxia. EXAM: PORTABLE CHEST 1 VIEW COMPARISON:  Chest radiograph September 18, 2016 FINDINGS: The cardiac silhouette appears mildly enlarged. Mediastinal silhouette is nonsuspicious. Similar pulmonary vascular congestion. Persistently elevated RIGHT hemidiaphragm. Patchy LEFT lung base airspace opacity. No pneumothorax. Soft tissue planes and included osseous structures are nonsuspicious. IMPRESSION: Similar cardiomegaly and pulmonary vascular congestion. LEFT lung base atelectasis or pneumonia. Electronically Signed   By: Elon Alas M.D.   On: 09/19/2016 05:11        Scheduled Meds: . amLODipine  2.5 mg Oral Daily  . arformoterol  15 mcg Nebulization BID  . budesonide (PULMICORT) nebulizer solution  0.5 mg Nebulization BID  . dextromethorphan-guaiFENesin  1 tablet Oral BID  . feeding supplement (GLUCERNA SHAKE)  237 mL Oral TID BM  . furosemide  60 mg Intravenous BID  . insulin aspart  0-15 Units Subcutaneous TID WC  . insulin aspart  0-5 Units Subcutaneous QHS  . insulin glargine  20 Units Subcutaneous Daily  . ipratropium-albuterol  3 mL Nebulization Q4H  . metoprolol succinate  12.5 mg Oral Daily  . piperacillin-tazobactam (ZOSYN)  IV  3.375 g Intravenous Q8H  . senna-docusate  1 tablet Oral BID  . simvastatin  20 mg Oral QHS  . sodium chloride flush  3 mL Intravenous Q12H  . vancomycin  1,500 mg Intravenous Q24H  . Warfarin - Pharmacist Dosing Inpatient   Does not apply q1800   Continuous Infusions:   LOS: 6 days      Nita Sells, MD [facility] Pager [phone]  If 7PM-7AM, please contact night-coverage [email or web address] Password [facility] 09/20/2016, 7:43 AM

## 2016-09-20 NOTE — Progress Notes (Signed)
RT came by room x2 for neb treatment. Pt is on bedside commode.

## 2016-09-20 NOTE — Progress Notes (Signed)
2000 Scheduled neb not given, pt is on bedside commode. PRN neb will be given for SOB/wheezing.

## 2016-09-20 NOTE — Progress Notes (Signed)
Pt placed on Bipap for nighttime rest.  Tolerating well at this time.  Pt states wears Cpap at night at home.

## 2016-09-20 NOTE — Progress Notes (Signed)
ANTICOAGULATION CONSULT NOTE - Follow Up Consult  Pharmacy Consult for Coumadin Indication: atrial fibrillation  Allergies  Allergen Reactions  . Quinine     Seizure     Patient Measurements: Height: 6\' 3"  (190.5 cm) Weight: 275 lb 2.2 oz (124.8 kg) IBW/kg (Calculated) : 84.5 Heparin Dosing Weight:   Vital Signs: Temp: 97.6 F (36.4 C) (01/02 1445) Temp Source: Axillary (01/02 1445) BP: 114/82 (01/02 1445) Pulse Rate: 96 (01/02 1445)  Labs:  Recent Labs  09/18/16 0412 09/19/16 0636 09/19/16 1946 09/20/16 0513  HGB  --  17.5*  --  16.6  HCT  --  51.2  --  51.5  PLT  --  198  --  167  LABPROT 26.3* 33.3*  --  47.7*  INR 2.36 3.18  --  4.99*  CREATININE 1.86* 1.69* 2.09* 2.17*  TROPONINI  --  0.05*  --   --     Estimated Creatinine Clearance: 38 mL/min (by C-G formula based on SCr of 2.17 mg/dL (H)).   Medications:  Scheduled:  . amLODipine  2.5 mg Oral Daily  . arformoterol  15 mcg Nebulization BID  . budesonide (PULMICORT) nebulizer solution  0.5 mg Nebulization BID  . dextromethorphan-guaiFENesin  1 tablet Oral BID  . feeding supplement (GLUCERNA SHAKE)  237 mL Oral TID BM  . furosemide  60 mg Intravenous BID  . insulin aspart  0-15 Units Subcutaneous TID WC  . insulin aspart  0-5 Units Subcutaneous QHS  . insulin glargine  20 Units Subcutaneous Daily  . ipratropium-albuterol  3 mL Nebulization TID  . metoprolol succinate  12.5 mg Oral Daily  . piperacillin-tazobactam (ZOSYN)  IV  3.375 g Intravenous Q8H  . senna-docusate  1 tablet Oral BID  . simvastatin  20 mg Oral QHS  . sodium chloride flush  3 mL Intravenous Q12H  . vancomycin  1,500 mg Intravenous Q24H  . Warfarin - Pharmacist Dosing Inpatient   Does not apply q1800    Assessment: 83yo male on Coumadin home dose for AFib.  INR has been increasing and is supratherapeutic today.  CBC is OK, no bleeding noted.  Cr is up again and Vancomycin was started yesterday.  Goal of Therapy:  INR  2-3 Monitor platelets by anticoagulation protocol: Yes   Plan:  No Coumadin today Continue daily INR  Will hold Vancomycin and check random level in AM to assure clearing drug   Gracy Bruins, PharmD Clinical Pharmacist [facility]

## 2016-09-20 NOTE — Care Management Note (Addendum)
Case Management Note  Patient Details  Name: William Braun MRN: [id number] Date of Birth: 14-Aug-1933  Subjective/Objective:  Patient presents with aucte on chronic resp failure with hypoxia, diuresing on/off bipap at night.  He lives with wife at home, has a pcp, and has transportation at dc, no problem with getting meds at dc. He also has a walker and a scooter at home, and home oxygen with [facility] 3 liters.  Per pt/ot eval rec hhpt/hhot, they will have a co pay for each therapy with [employer] insurance,   Wife states they would like to have hhpt and [facility] for lab draws for his pt/inr, they do not want hhot.  She chose [facility], referral made to [facility] for [facility] for lab draws ( pt/inr) and HHPT, soc will begin 24-48 hrs post dc.  NCM will cont to follow for dc needs. ( Wife states she may decide to cancel the [facility] if she decides to take him to the MD office for lab draws but she will have to think on it.)                    Action/Plan:   Expected Discharge Date:  09/18/16               Expected Discharge Plan:  [place]  In-House Referral:  NA  Discharge planning Services  CM Consult  Post Acute Care Choice:    Choice offered to:     DME Arranged:    DME Agency:     HH Arranged:    [place] Agency:     Status of Service:  In process, will continue to follow  If discussed at Long Length of Stay Meetings, dates discussed:    Additional Comments:  Zenon Mayo, RN 09/20/2016, 4:05 PM

## 2016-09-20 NOTE — Progress Notes (Signed)
Occupational Therapy Treatment Patient Details Name: William Braun MRN: [id number] DOB: 01/03/1933 Today's Date: 09/20/2016    History of present illness pt presents with COPD Exacerbation.  pt with hx of COPD, Chronic Respiratory Failure, OSA, HTN, DM, CLL, CHF, A-fib, CVA, and A-fib.     OT comments  Pt requires incr rest breaks unable to tolerate static standing. Pt requires x2 (A) to complete don of adult diaper. Pt requires (A) to pivot to chair. Pt will require high level (A) for all adls. Question familys ability to (A) . Pt current desaturation 84% during session and cues to purse lip breath and decr talking to rebound.    Follow Up Recommendations  Home health OT (question need for high level if not progressing)    Equipment Recommendations  None recommended by OT    Recommendations for Other Services      Precautions / Restrictions Precautions Precautions: Fall Precaution Comments: Watch O2 sats Restrictions Weight Bearing Restrictions: No       Mobility Bed Mobility Overal bed mobility: Needs Assistance Bed Mobility: Supine to Sit     Supine to sit: Min assist;HOB elevated     General bed mobility comments: P requires (A) of pad to scoot buttock to EOB and HOB elevated to bring trunk to upright  Transfers Overall transfer level: Needs assistance   Transfers: Sit to/from Stand Sit to Stand: Mod assist   Squat pivot transfers: Mod assist     General transfer comment: stand pivot to chair x2 but unable to sustain. Reports pain and L knee buckle    Balance Overall balance assessment: Needs assistance Sitting-balance support: Bilateral upper extremity supported;Feet supported Sitting balance-Leahy Scale: Good     Standing balance support: Bilateral upper extremity supported;During functional activity Standing balance-Leahy Scale: Poor                     ADL Overall ADL's : Needs assistance/impaired   Eating/Feeding Details (indicate cue type  and reason): agreeable to transfer to chair to eat pending breakfast arrival Grooming: Wash/dry hands;Wash/dry face;Set up;Sitting Grooming Details (indicate cue type and reason): Pt requires seated position and completes task briskly Upper Body Bathing: Maximal assistance   Lower Body Bathing: Maximal assistance           Toilet Transfer: Minimal assistance Toilet Transfer Details (indicate cue type and reason): lateral scoot to chair with buttock clearance from surface           General ADL Comments: pt fatigues quickly and DOE 3 out4. pt states "what do you want me to do next?" Pt very anxious with movement and benefits from current step and one step ahead instructions to help with anxiety      Vision                     Perception     Praxis      Cognition   Behavior During Therapy: [facility] for tasks assessed/performed Overall Cognitive Status: Within Functional Limits for tasks assessed                       Extremity/Trunk Assessment               Exercises     Shoulder Instructions       General Comments      Pertinent Vitals/ Pain       Pain Assessment: Faces Faces Pain Scale: Hurts little more Pain Location: L  knee with don of sock and mobility Pain Descriptors / Indicators: Sore Pain Intervention(s): Monitored during session;Repositioned  Home Living                                          Prior Functioning/Environment              Frequency  Min 2X/week        Progress Toward Goals  OT Goals(current goals can now be found in the care plan section)  Progress towards OT goals: Progressing toward goals  Acute Rehab OT Goals Patient Stated Goal: to be able to walk into the bathroom  OT Goal Formulation: With patient Time For Goal Achievement: 09/30/16 Potential to Achieve Goals: Good ADL Goals Pt Will Perform Upper Body Bathing: with set-up;sitting Pt Will Perform Lower Body Bathing: with mod  assist;sit to/from stand;with adaptive equipment Pt Will Perform Upper Body Dressing: with set-up;sitting Pt Will Perform Lower Body Dressing: with mod assist;with adaptive equipment;sit to/from stand Pt Will Transfer to Toilet: with min assist;ambulating;regular height toilet;grab bars Pt Will Perform Toileting - Clothing Manipulation and hygiene: with min assist;sit to/from stand Pt/caregiver will Perform Home Exercise Program: Increased strength;Right Upper extremity;Left upper extremity;With written HEP provided;With Supervision  Plan Discharge plan remains appropriate    Co-evaluation                 End of Session Equipment Utilized During Treatment: Oxygen   Activity Tolerance Patient limited by fatigue   Patient Left with call bell/phone within reach;in chair   Nurse Communication Mobility status;Precautions        Time: [id number] OT Time Calculation (min): 20 min  Charges: OT General Charges $OT Visit: 1 Procedure OT Treatments $Self Care/Home Management : 8-22 mins  Parke Poisson B 09/20/2016, 10:43 AM   Jeri Modena   OTR/L Pager: [phone] Office: [phone] .

## 2016-09-20 NOTE — Progress Notes (Signed)
PT Cancellation Note  Patient Details Name: CHICK FELGAR MRN: [id number] DOB: 09/12/1933   Cancelled Treatment:    Reason Eval/Treat Not Completed: Other (comment)  Pt just finishing with OT.  Will f/u another time.     Thornton Papas Joyce Heitman 09/20/2016, 9:38 AM

## 2016-09-20 NOTE — Progress Notes (Signed)
ADMISSION DATE:  09/14/2016 CONSULTATION DATE:  09/19/2016 REFERRING MD :  Dr. Algis Liming  CHIEF COMPLAINT:  Short of breath   HISTORY OF PRESENT ILLNESS:   83 yo male former smoker presented with progressive dyspnea, hypoxia from pneumonia and acute on chronic diastolic CHF in setting of COPD, OSA/OHS, Rt hemidiaphragm paralysis.  SUBJECTIVE:  Breathing better.  Still feels weak.  VITAL SIGNS: BP 99/70 (BP Location: Left Arm)   Pulse 99   Temp 97.5 F (36.4 C) (Axillary)   Resp 20   Ht 6\' 3"  (1.905 m)   Wt 275 lb 2.2 oz (124.8 kg)   SpO2 97%   BMI 34.39 kg/m    INTAKE/OUTPUT: I/O last 3 completed shifts: In: 100 [IV Piggyback:100] Out: 4475 [Urine:4475]  PHYSICAL EXAMINATION: General:  NAD, AAOx3 Neuro:  CN II - XII intact HEENT:  Poor dentition, PERRLA, dry MM Cardiovascular:  RRR, S1/s2 no m/r/g Lungs:  Deminished breath sounds in bases Abdomen:  Soft, NT/ND, normal bowel sounds Musculoskeletal:  Normal bulk and tone Skin:  No c/c/e   CMP Latest Ref Rng & Units 09/20/2016 09/19/2016 09/19/2016  Glucose 65 - 99 mg/dL 121(H) 133(H) 92  BUN 6 - 20 mg/dL 87(H) 89(H) 88(H)  Creatinine 0.61 - 1.24 mg/dL 2.17(H) 2.09(H) 1.69(H)  Sodium 135 - 145 mmol/L 149(H) 146(H) 144  Potassium 3.5 - 5.1 mmol/L 3.1(L) 3.8 3.1(L)  Chloride 101 - 111 mmol/L 94(L) 94(L) 88(L)  CO2 22 - 32 mmol/L 41(H) 39(H) 40(H)  Calcium 8.9 - 10.3 mg/dL 9.7 9.5 9.9  Total Protein 6.0 - 8.5 g/dL - - -  Total Bilirubin 0.20 - 1.20 mg/dL - - -  Alkaline Phos 40 - 150 U/L - - -  AST 5 - 34 U/L - - -  ALT 0 - 55 U/L - - -    CBC Latest Ref Rng & Units 09/20/2016 09/19/2016 09/16/2016  WBC 4.0 - 10.5 K/uL 38.0(H) 49.4(H) 38.2(H)  Hemoglobin 13.0 - 17.0 g/dL 16.6 17.5(H) 15.4  Hematocrit 39.0 - 52.0 % 51.5 51.2 45.3  Platelets 150 - 400 K/uL 167 198 179    ABG    Component Value Date/Time   PHART 7.479 (H) 09/19/2016 0427   PCO2ART 57.5 (H) 09/19/2016 0427   PO2ART 66.8 (L) 09/19/2016 0427   HCO3  42.3 (H) 09/19/2016 0427   TCO2 42 09/14/2016 1900   ACIDBASEDEF 3.1 (H) 06/10/2010 0511   O2SAT 93.1 09/19/2016 0427    CBG (last 3)   Recent Labs  09/19/16 2059 09/20/16 0743 09/20/16 1146  GLUCAP 119* 126* 224*     IMAGING: Ct Chest Wo Contrast  Result Date: 09/19/2016 CLINICAL DATA:  Hypoxemia EXAM: CT CHEST WITHOUT CONTRAST TECHNIQUE: Multidetector CT imaging of the chest was performed following the standard protocol without IV contrast. COMPARISON:  Chest x-ray today. FINDINGS: Cardiovascular: Heart is borderline enlarged. Valvular, coronary artery and aortic calcifications. No aneurysm. Mediastinum/Nodes: Numerous mildly enlarged mediastinal lymph nodes. Pre tracheal lymph node has a short axis diameter of 18 mm. Right paratracheal lymph node has a short axis diameter of 16 mm. Mildly enlarged prevascular in AP window small a subcarinal lymph nodes. Subcarinal lymph node has a short axis diameter of 18 mm. No axillary adenopathy. Difficult to assess the hila without intravenous contrast. Lungs/Pleura: Airspace disease noted in both lower lobes, left greater than right concerning for multifocal pneumonia. No pleural effusions. Upper Abdomen: Imaging into the upper abdomen shows no acute findings. Musculoskeletal: Chest wall soft tissues are unremarkable.  No acute bony abnormality or focal bone lesion. IMPRESSION: Bilateral lower lobe airspace opacities, left greater than right concerning for multifocal pneumonia. Mediastinal adenopathy. This may be reactive, but recommend follow-up to ensure stability or improvement. Follow-up CT with intravenous contrast if possible could be performed in 3-6 months. Coronary artery disease, aortic atherosclerosis. Electronically Signed   By: Rolm Baptise M.D.   On: 09/19/2016 14:35   Dg Chest Port 1 View  Result Date: 09/18/2016 CLINICAL DATA:  COPD exacerbation EXAM: PORTABLE CHEST 1 VIEW COMPARISON:  September 14, 2016 FINDINGS: The heart size and  mediastinal contours are stable. The heart size is enlarged. The aorta is tortuous. There is pulmonary edema. There is no focal pneumonia or pleural effusion. Elevation of right hemidiaphragm is unchanged. The visualized skeletal structures are stable. IMPRESSION: Cardiomegaly with pulmonary edema. Electronically Signed   By: Abelardo Diesel M.D.   On: 09/18/2016 15:32   Dg Chest Port 1 View  Result Date: 09/19/2016 CLINICAL DATA:  Hypoxia. EXAM: PORTABLE CHEST 1 VIEW COMPARISON:  Chest radiograph September 18, 2016 FINDINGS: The cardiac silhouette appears mildly enlarged. Mediastinal silhouette is nonsuspicious. Similar pulmonary vascular congestion. Persistently elevated RIGHT hemidiaphragm. Patchy LEFT lung base airspace opacity. No pneumothorax. Soft tissue planes and included osseous structures are nonsuspicious. IMPRESSION: Similar cardiomegaly and pulmonary vascular congestion. LEFT lung base atelectasis or pneumonia. Electronically Signed   By: Elon Alas M.D.   On: 09/19/2016 05:11   STUDIES: Echo 1/01 >> mod LVH, EF 55 to 60%, mild AS, PAS 56 mmHg CT chest 1/01 >> b/l ASD Lt > Rt, mediastinal LAN  CULTURES: Pneumococcal Ag 12/27 >> negative Influenza PCR 12/28 >> negative Blood 1/01 >>   ANTIBIOTICS: Zosyn 12/31 >> Vancomycin 1/01 >>   ASSESSMENT / PLAN:  Acute on chronic hypoxic/hypercapnic respiratory failure. - oxygen to keep SpO2 90 to 95%  COPD. - brovana, pulmicort, duoneb  OSA/OHS. - Bipap qhs >> 14/10 cm H2O as outpt  Acute on chronic diastolic CHF. Hx of A fib. - BP control, diuresis per primary team - coumadin per pharmacy  HCAP. - Abx per primary team  Richardson Landry Minor ACNP [facility] PCCM Pager [phone] till 3 pm If no answer page [phone] 09/20/2016, 10:33 AM   Feels better.  Not as much cough.  Basilar crackles.  HR regular.  Abd soft.  BMP Latest Ref Rng & Units 09/20/2016 09/19/2016 09/19/2016  Glucose 65 - 99 mg/dL 121(H) 133(H) 92  BUN 6 - 20 mg/dL  87(H) 89(H) 88(H)  Creatinine 0.61 - 1.24 mg/dL 2.17(H) 2.09(H) 1.69(H)  Sodium 135 - 145 mmol/L 149(H) 146(H) 144  Potassium 3.5 - 5.1 mmol/L 3.1(L) 3.8 3.1(L)  Chloride 101 - 111 mmol/L 94(L) 94(L) 88(L)  CO2 22 - 32 mmol/L 41(H) 39(H) 40(H)  Calcium 8.9 - 10.3 mg/dL 9.7 9.5 9.9    CBC Latest Ref Rng & Units 09/20/2016 09/19/2016 09/16/2016  WBC 4.0 - 10.5 K/uL 38.0(H) 49.4(H) 38.2(H)  Hemoglobin 13.0 - 17.0 g/dL 16.6 17.5(H) 15.4  Hematocrit 39.0 - 52.0 % 51.5 51.2 45.3  Platelets 150 - 400 K/uL 167 198 179    Assessment/plan:  Acute on chronic respiratory failure. HCAP. COPD. OSA/OHS. Acute on chronic diastolic CHF. Rt hemidiaphragm elevation. - continue abx - negative fluid balance - continue BDs - Bipap qhs - continue oxygen - f/u CXR intermittently  Updated pt's wife at bedside  Chesley Mires, MD [place] 09/20/2016, 12:43 PM Pager:  [phone] After 3pm call: [phone]

## 2016-09-21 ENCOUNTER — Inpatient Hospital Stay (HOSPITAL_COMMUNITY): Payer: PPO

## 2016-09-21 ENCOUNTER — Encounter (HOSPITAL_COMMUNITY): Payer: PPO

## 2016-09-21 LAB — CBC WITH DIFFERENTIAL/PLATELET
Basophils Absolute: 0 10*3/uL (ref 0.0–0.1)
Basophils Relative: 0 %
EOS PCT: 4 %
Eosinophils Absolute: 0.6 10*3/uL (ref 0.0–0.7)
HEMATOCRIT: 48.6 % (ref 39.0–52.0)
Hemoglobin: 16.5 g/dL (ref 13.0–17.0)
LYMPHS PCT: 34 %
Lymphs Abs: 4.7 10*3/uL — ABNORMAL HIGH (ref 0.7–4.0)
MCH: 30.8 pg (ref 26.0–34.0)
MCHC: 34 g/dL (ref 30.0–36.0)
MCV: 90.8 fL (ref 78.0–100.0)
MONO ABS: 0.8 10*3/uL (ref 0.1–1.0)
Monocytes Relative: 6 %
Neutro Abs: 7.8 10*3/uL — ABNORMAL HIGH (ref 1.7–7.7)
Neutrophils Relative %: 56 %
PLATELETS: 192 10*3/uL (ref 150–400)
RBC: 5.35 MIL/uL (ref 4.22–5.81)
RDW: 16.5 % — ABNORMAL HIGH (ref 11.5–15.5)
WBC: 13.9 10*3/uL — AB (ref 4.0–10.5)

## 2016-09-21 LAB — BASIC METABOLIC PANEL
Anion gap: 13 (ref 5–15)
BUN: 73 mg/dL — AB (ref 6–20)
CHLORIDE: 90 mmol/L — AB (ref 101–111)
CO2: 38 mmol/L — AB (ref 22–32)
Calcium: 8.8 mg/dL — ABNORMAL LOW (ref 8.9–10.3)
Creatinine, Ser: 1.76 mg/dL — ABNORMAL HIGH (ref 0.61–1.24)
GFR calc non Af Amer: 35 mL/min — ABNORMAL LOW (ref >60)
GFR, EST AFRICAN AMERICAN: 40 mL/min — AB (ref >60)
Glucose, Bld: 135 mg/dL — ABNORMAL HIGH (ref 65–99)
POTASSIUM: 2.8 mmol/L — AB (ref 3.5–5.1)
SODIUM: 141 mmol/L (ref 135–145)

## 2016-09-21 LAB — GLUCOSE, CAPILLARY
GLUCOSE-CAPILLARY: 145 mg/dL — AB (ref 65–99)
Glucose-Capillary: 148 mg/dL — ABNORMAL HIGH (ref 65–99)
Glucose-Capillary: 283 mg/dL — ABNORMAL HIGH (ref 65–99)
Glucose-Capillary: 196 mg/dL — ABNORMAL HIGH (ref 65–99)

## 2016-09-21 LAB — CBC
HEMATOCRIT: 49.1 % (ref 39.0–52.0)
Hemoglobin: 16.3 g/dL (ref 13.0–17.0)
MCH: 30.6 pg (ref 26.0–34.0)
MCHC: 33.2 g/dL (ref 30.0–36.0)
MCV: 92.3 fL (ref 78.0–100.0)
Platelets: 148 10*3/uL — ABNORMAL LOW (ref 150–400)
RBC: 5.32 MIL/uL (ref 4.22–5.81)
RDW: 16.8 % — ABNORMAL HIGH (ref 11.5–15.5)
WBC: 18 10*3/uL — AB (ref 4.0–10.5)

## 2016-09-21 LAB — PROTIME-INR
INR: 5.36
Prothrombin Time: 50.6 seconds — ABNORMAL HIGH (ref 11.4–15.2)

## 2016-09-21 LAB — VANCOMYCIN, RANDOM: Vancomycin Rm: 20

## 2016-09-21 MED ORDER — VANCOMYCIN HCL 10 G IV SOLR
1750.0000 mg | INTRAVENOUS | Status: DC
  Filled 2016-09-21: qty 1750

## 2016-09-21 MED ORDER — POTASSIUM CHLORIDE CRYS ER 20 MEQ PO TBCR
40.0000 meq | EXTENDED_RELEASE_TABLET | Freq: Two times a day (BID) | ORAL | Status: DC
  Administered 2016-09-21 – 2016-09-23 (×5): 40 meq via ORAL
  Filled 2016-09-21 (×5): qty 2

## 2016-09-21 MED ORDER — AMOXICILLIN-POT CLAVULANATE 875-125 MG PO TABS
1.0000 | ORAL_TABLET | Freq: Two times a day (BID) | ORAL | Status: DC
  Administered 2016-09-21 – 2016-09-23 (×5): 1 via ORAL
  Filled 2016-09-21 (×6): qty 1

## 2016-09-21 NOTE — Progress Notes (Signed)
Pt yelling, not tolerating bipap.  Pt educated on the importance of bipap and why it is beneficial, pt still refusing after taking it off.  Pt placed back on HFNC 6 L, sats within desired range.  May tolerate cpap better at night.  Will continue to monitor.

## 2016-09-21 NOTE — Progress Notes (Signed)
Occupational Therapy Treatment Patient Details Name: William Braun MRN: [id number] DOB: Mar 30, 1933 Today's Date: 09/21/2016    History of present illness pt presents with COPD Exacerbation.  pt with hx of COPD, Chronic Respiratory Failure, OSA, HTN, DM, CLL, CHF, A-fib, CVA, and A-fib.     OT comments  Pt fatigued this pm.  He participated in bil. UE strengthening exercises.  02 sats dropped to 84% on 4L supplemental 02 and required significant amount of time to rebound to 90%.   Will continue to follow.   Follow Up Recommendations  Home health OT    Equipment Recommendations  None recommended by OT    Recommendations for Other Services      Precautions / Restrictions Precautions Precautions: Fall Precaution Comments: watch O2 stats       Mobility Bed Mobility                  Transfers                      Balance                                   ADL                                                Vision                     Perception     Praxis      Cognition   Behavior During Therapy: WFL for tasks assessed/performed Overall Cognitive Status: Within Functional Limits for tasks assessed                       Extremity/Trunk Assessment               Exercises General Exercises - Upper Extremity Shoulder Flexion: Strengthening;Right;Left;10 reps;Supine Shoulder Extension: Strengthening;Right;Left;10 reps;Supine Elbow Flexion: Strengthening;Right;Left;10 reps;Supine Elbow Extension: Strengthening;Right;Left;10 reps;Supine   Shoulder Instructions       General Comments      Pertinent Vitals/ Pain       Pain Assessment: Faces Faces Pain Scale: No hurt  Home Living                                          Prior Functioning/Environment              Frequency  Min 2X/week        Progress Toward Goals  OT Goals(current goals can now be found in the  care plan section)  Progress towards OT goals: Progressing toward goals     Plan Discharge plan remains appropriate    Co-evaluation                 End of Session Equipment Utilized During Treatment: Oxygen   Activity Tolerance Patient limited by fatigue   Patient Left in bed;with call bell/phone within reach;with family/visitor present   Nurse Communication Mobility status        Time: [id number] OT Time Calculation (min): 11 min  Charges: OT General Charges $OT Visit: 1 Procedure OT Treatments $Therapeutic Exercise: 8-22 mins  William Braun, Ellard Artis  M 09/21/2016, 6:16 PM

## 2016-09-21 NOTE — Care Management Important Message (Signed)
Important Message  Patient Details  Name: William Braun MRN: [id number] Date of Birth: Jan 05, 1933   Medicare Important Message Given:  Yes    Nathen May 09/21/2016, 11:47 AM

## 2016-09-21 NOTE — Progress Notes (Signed)
Report received from [place], [place] for tx to [phone]

## 2016-09-21 NOTE — Progress Notes (Signed)
ADMISSION DATE:  09/14/2016 CONSULTATION DATE:  09/19/2016 REFERRING MD :  Dr. Algis Liming  CHIEF COMPLAINT:  Short of breath   HISTORY OF PRESENT ILLNESS:   83 yo male former smoker presented with progressive dyspnea, hypoxia from pneumonia and acute on chronic diastolic CHF in setting of COPD, OSA/OHS, Rt hemidiaphragm paralysis.  SUBJECTIVE:  NSC will try home machine  VITAL SIGNS: BP 104/70 (BP Location: Left Arm)   Pulse 89   Temp 98.2 F (36.8 C) (Oral)   Resp 19   Ht 6\' 3"  (1.905 m)   Wt 275 lb 2.2 oz (124.8 kg)   SpO2 94%   BMI 34.39 kg/m    INTAKE/OUTPUT: I/O last 3 completed shifts: In: 1060 [P.O.:960; IV Piggyback:100] Out: [id number] [Urine:3850]  PHYSICAL EXAMINATION: General:  NAD, AAOx3, elderly Neuro:  CN II - XII intact. Sleepy but arousable HEENT:  Poor dentition, PERRLA, dry MM Cardiovascular:  HSIR,  no m/r/g, a fib Lungs:  Deminished breath sounds in bases. When asleep abd cw paradoxus Noted off bipap  Abdomen:  Soft, NT/ND, normal bowel sounds Musculoskeletal:  Normal bulk and tone Skin:  No c/c/e, lower ext PVD noted   CMP Latest Ref Rng & Units 09/21/2016 09/20/2016 09/19/2016  Glucose 65 - 99 mg/dL 135(H) 121(H) 133(H)  BUN 6 - 20 mg/dL 73(H) 87(H) 89(H)  Creatinine 0.61 - 1.24 mg/dL 1.76(H) 2.17(H) 2.09(H)  Sodium 135 - 145 mmol/L 141 149(H) 146(H)  Potassium 3.5 - 5.1 mmol/L 2.8(L) 3.1(L) 3.8  Chloride 101 - 111 mmol/L 90(L) 94(L) 94(L)  CO2 22 - 32 mmol/L 38(H) 41(H) 39(H)  Calcium 8.9 - 10.3 mg/dL 8.8(L) 9.7 9.5  Total Protein 6.0 - 8.5 g/dL - - -  Total Bilirubin 0.20 - 1.20 mg/dL - - -  Alkaline Phos 40 - 150 U/L - - -  AST 5 - 34 U/L - - -  ALT 0 - 55 U/L - - -    CBC Latest Ref Rng & Units 09/21/2016 09/20/2016 09/19/2016  WBC 4.0 - 10.5 K/uL PENDING 38.0(H) 49.4(H)  Hemoglobin 13.0 - 17.0 g/dL 16.3 16.6 17.5(H)  Hematocrit 39.0 - 52.0 % 49.1 51.5 51.2  Platelets 150 - 400 K/uL 148(L) 167 198    ABG    Component Value Date/Time   PHART 7.479 (H) 09/19/2016 0427   PCO2ART 57.5 (H) 09/19/2016 0427   PO2ART 66.8 (L) 09/19/2016 0427   HCO3 42.3 (H) 09/19/2016 0427   TCO2 42 09/14/2016 1900   ACIDBASEDEF 3.1 (H) 06/10/2010 0511   O2SAT 93.1 09/19/2016 0427    CBG (last 3)   Recent Labs  09/20/16 1707 09/20/16 2112 09/21/16 0729  GLUCAP 224* 116* 148*     IMAGING: Ct Chest Wo Contrast  Result Date: 09/19/2016 CLINICAL DATA:  Hypoxemia EXAM: CT CHEST WITHOUT CONTRAST TECHNIQUE: Multidetector CT imaging of the chest was performed following the standard protocol without IV contrast. COMPARISON:  Chest x-ray today. FINDINGS: Cardiovascular: Heart is borderline enlarged. Valvular, coronary artery and aortic calcifications. No aneurysm. Mediastinum/Nodes: Numerous mildly enlarged mediastinal lymph nodes. Pre tracheal lymph node has a short axis diameter of 18 mm. Right paratracheal lymph node has a short axis diameter of 16 mm. Mildly enlarged prevascular in AP window small a subcarinal lymph nodes. Subcarinal lymph node has a short axis diameter of 18 mm. No axillary adenopathy. Difficult to assess the hila without intravenous contrast. Lungs/Pleura: Airspace disease noted in both lower lobes, left greater than right concerning for multifocal pneumonia. No pleural effusions.  Upper Abdomen: Imaging into the upper abdomen shows no acute findings. Musculoskeletal: Chest wall soft tissues are unremarkable. No acute bony abnormality or focal bone lesion. IMPRESSION: Bilateral lower lobe airspace opacities, left greater than right concerning for multifocal pneumonia. Mediastinal adenopathy. This may be reactive, but recommend follow-up to ensure stability or improvement. Follow-up CT with intravenous contrast if possible could be performed in 3-6 months. Coronary artery disease, aortic atherosclerosis. Electronically Signed   By: Rolm Baptise M.D.   On: 09/19/2016 14:35   Dg Chest Port 1 View  Result Date: 09/21/2016 CLINICAL DATA:   Respiratory failure, COPD CHF. EXAM: PORTABLE CHEST 1 VIEW COMPARISON:  Chest x-ray and chest CT scan of September 19, 2016. FINDINGS: There is persistent elevation of the right hemidiaphragm. The aerated portion of the right lung appears clear. On the left mildly increased interstitial density is noted in the mid and lower lung but this is stable. There is no pleural effusion. The heart is normal in size. The pulmonary vascularity is not engorged. The mediastinum is subjectively widened but stable. The observed bony thorax is unremarkable. IMPRESSION: Improved pulmonary vascular congestion. Persistent mild interstitial prominence in the left lung consistent with residual but improving pneumonia. Persistent elevation of the right hemidiaphragm which limits evaluation of the right lung base. Electronically Signed   By: David  Martinique M.D.   On: 09/21/2016 07:43   STUDIES: Echo 1/01 >> mod LVH, EF 55 to 60%, mild AS, PAS 56 mmHg CT chest 1/01 >> b/l ASD Lt > Rt, mediastinal LAN  CULTURES: Pneumococcal Ag 12/27 >> negative Influenza PCR 12/28 >> negative Blood 1/01 >> ngtd>>  ANTIBIOTICS: Zosyn 12/31 >> Vancomycin 1/01 >>   Lab Results  Component Value Date   INR 5.36 (HH) 09/21/2016   INR 4.99 (HH) 09/20/2016   INR 3.18 09/19/2016     Intake/Output Summary (Last 24 hours) at 09/21/16 0923 Last data filed at 09/21/16 [id number]  Gross per 24 hour  Intake              770 ml  Output             1950 ml  Net            -1180 ml    ASSESSMENT / PLAN:  Acute on chronic hypoxic/hypercapnic respiratory failure. - oxygen to keep SpO2 90 to 95% -Use home cpap as of 1/3 as he does not tolerate hospital bipap well -neg I/o neg >4 l per 48 hrs as of 1/3  COPD. - brovana, pulmicort, duoneb  OSA/OHS. - Bipap qhs >> 14/10 cm H2O as outpt, use home machine  Acute on chronic diastolic CHF. Hx of A fib. - BP control, diuresis per primary team - coumadin per pharmacy(note INR 5.36 on 1/3)  HCAP. -  Abx per primary team  Richardson Landry Minor ACNP [facility] PCCM Pager [phone] till 3 pm If no answer page [phone] 09/21/2016, 9:20 AM   Breathing better.  Cough better.  No wheeze.  Decreased BS Rt base.  Decreased Edema.  CXR - better aeration.  Assessment/plan:  Acute on chronic respiratory failure. COPD. HCAP. OSA/OHS. Rt diaphragm elevation. - improving - abx per primary team  - continue BDs - continue oxygen and Bipap at night   I have scheduled pulmonary follow up with Eric Form on Wednesday, January 17 at 2:30 pm.  Please call if additional help needed while he is in hospital.  Chesley Mires, MD [place] 09/21/2016, 2:42 PM Pager:  [phone] After 3pm call: [phone]

## 2016-09-21 NOTE — Progress Notes (Signed)
Physical Therapy Treatment Patient Details Name: William Braun MRN: [id number] DOB: 21-Jun-1933 Today's Date: 09/21/2016    History of Present Illness pt presents with COPD Exacerbation.  pt with hx of COPD, Chronic Respiratory Failure, OSA, HTN, DM, CLL, CHF, A-fib, CVA, and A-fib.      PT Comments    Pt functioning near baseline. Pt con't to be limited by dec SpO2 into 70s with OOB mobility and in low-mid 80s with LE there ex. Noted DOE. Pt was on 4Lo2 via [place] during session. S/p 5 min rest pt able to achieve 91% SpO2. Acute PT to follow.  Follow Up Recommendations  Home health PT;Supervision/Assistance - 24 hour     Equipment Recommendations  None recommended by PT    Recommendations for Other Services       Precautions / Restrictions Precautions Precautions: Fall Precaution Comments: watch O2 stats Restrictions Weight Bearing Restrictions: No    Mobility  Bed Mobility Overal bed mobility: Needs Assistance Bed Mobility: Supine to Sit     Supine to sit: Min assist;HOB elevated     General bed mobility comments: pt pulled on PT to scoot to EOB, increased time, SpO2 dec into the 70s  Transfers Overall transfer level: Needs assistance   Transfers: Sit to/from Stand;Stand Pivot Transfers Sit to Stand: Min assist Stand pivot transfers: Min assist       General transfer comment: pt pulled self up using arm rest of chairs with chair being in front of him (directly facing him) and then turned self holding onto chair and then bed, no physical assist needed,   Ambulation/Gait             General Gait Details: pt doesn't amb due to poor pulmonary condition   Stairs            Wheelchair Mobility    Modified Rankin (Stroke Patients Only)       Balance Overall balance assessment: Needs assistance Sitting-balance support: Bilateral upper extremity supported;Feet supported Sitting balance-Leahy Scale: Good     Standing balance support: Bilateral upper  extremity supported;During functional activity Standing balance-Leahy Scale: Poor                      Cognition Arousal/Alertness: Awake/alert Behavior During Therapy: WFL for tasks assessed/performed Overall Cognitive Status: Within Functional Limits for tasks assessed                      Exercises General Exercises - Lower Extremity Ankle Circles/Pumps: AROM;Both;10 reps Long Arc Quad: AAROM;Left;5 reps;Seated Hip Flexion/Marching: AROM;Right;5 reps;Seated    General Comments        Pertinent Vitals/Pain Pain Assessment: 0-10 Pain Score: 5  Pain Location: chronic L knee pain Pain Descriptors / Indicators: Sore Pain Intervention(s): Monitored during session    Home Living                      Prior Function            PT Goals (current goals can now be found in the care plan section) Progress towards PT goals: Progressing toward goals    Frequency    Min 3X/week      PT Plan Current plan remains appropriate    Co-evaluation             End of Session Equipment Utilized During Treatment: Oxygen (4LO2 via [place]) Activity Tolerance: Patient limited by fatigue (and pulmonary condition) Patient left: in chair;with  call bell/phone within reach;with family/visitor present     Time: 0824-0848 PT Time Calculation (min) (ACUTE ONLY): 24 min  Charges:  $Therapeutic Exercise: 8-22 mins $Therapeutic Activity: 8-22 mins                    G Codes:      Berline Lopes 10/08/16, 8:56 AM   Kittie Plater, PT, DPT Pager #: [phone] Office #: [phone]

## 2016-09-21 NOTE — Progress Notes (Signed)
Inpatient Diabetes Program Recommendations  AACE/ADA: New Consensus Statement on Inpatient Glycemic Control (2015)  Target Ranges:  Prepandial:   less than 140 mg/dL      Peak postprandial:   less than 180 mg/dL (1-2 hours)      Critically ill patients:  140 - 180 mg/dL   Results for TREVIS, NYLEN (MRN [id number]) as of 09/21/2016 09:02  Ref. Range 09/20/2016 07:43 09/20/2016 11:46 09/20/2016 17:07 09/20/2016 21:12 09/21/2016 07:29  Glucose-Capillary Latest Ref Range: 65 - 99 mg/dL 126 (H) 224 (H) 224 (H) 116 (H) 148 (H)   Review of Glycemic Control  Current orders for Inpatient glycemic control: Lantus 20 units daily, Novolog 0-15 units TID with meals, Novolog 0-5 units QHS  Inpatient Diabetes Program Recommendations: Insulin - Meal Coverage: Please consider ordering Novolog 3 units TID with meals for meal coverage if patient eats at least 50% of meals.  Thanks, Barnie Alderman, RN, MSN, CDE Diabetes Coordinator Inpatient Diabetes Program [phone] (Team Pager from 8am to 5pm)

## 2016-09-21 NOTE — Progress Notes (Signed)
Patient transfer to room from 3S. Patient oriented to room and plan of care. VSS. Bed alarm placed. Telemetry box 25 placed.

## 2016-09-21 NOTE — Progress Notes (Signed)
ANTICOAGULATION and ANTIBIOTIC CONSULT NOTE - Follow Up Consult  Pharmacy Consult for Coumadin and Vancomycin Indication: atrial fibrillation and CAP  Allergies  Allergen Reactions  . Quinine     Seizure     Patient Measurements: Height: 6\' 3"  (190.5 cm) Weight: 275 lb 2.2 oz (124.8 kg) IBW/kg (Calculated) : 84.5  Vital Signs: Temp: 98.2 F (36.8 C) (01/03 0727) Temp Source: Oral (01/03 0727) BP: 104/70 (01/03 0727) Pulse Rate: 89 (01/03 0727)  Labs:  Recent Labs  09/19/16 0636 09/19/16 1946 09/20/16 0513 09/21/16 0715  HGB 17.5*  --  16.6 16.3  HCT 51.2  --  51.5 49.1  PLT 198  --  167 148*  LABPROT 33.3*  --  47.7* 50.6*  INR 3.18  --  4.99* 5.36*  CREATININE 1.69* 2.09* 2.17* 1.76*  TROPONINI 0.05*  --   --   --    Random Vancomycin level 20  1/1 BCx: ntd 12/28 Influenza A&B: neg  12/28 MRSA PCR: neg 12/27 strep urinary antigen: neg  Estimated Creatinine Clearance: 46.8 mL/min (by C-G formula based on SCr of 1.76 mg/dL (H)).   Medications:  Scheduled:  . amLODipine  2.5 mg Oral Daily  . arformoterol  15 mcg Nebulization BID  . budesonide (PULMICORT) nebulizer solution  0.5 mg Nebulization BID  . dextromethorphan-guaiFENesin  1 tablet Oral BID  . feeding supplement (GLUCERNA SHAKE)  237 mL Oral TID BM  . furosemide  60 mg Intravenous BID  . insulin aspart  0-15 Units Subcutaneous TID WC  . insulin aspart  0-5 Units Subcutaneous QHS  . insulin glargine  20 Units Subcutaneous Daily  . ipratropium-albuterol  3 mL Nebulization TID  . metoprolol succinate  12.5 mg Oral Daily  . piperacillin-tazobactam (ZOSYN)  IV  3.375 g Intravenous Q8H  . senna-docusate  1 tablet Oral BID  . simvastatin  20 mg Oral QHS  . sodium chloride flush  3 mL Intravenous Q12H  . Warfarin - Pharmacist Dosing Inpatient   Does not apply q1800    Assessment: 83yo male on Coumadin for AFib with supratherapeutic INR.  Dose held yesterday.  CBC is OK, no bleeding noted.  Pt on  Vancomycin for pneumonia.  Cr is improved today.  Random Vanc level drawn which is not steady state and is at upper limit of goal.  Expect patient to keep accumulating.  Goal of Therapy:  INR 2-3 Vanc trough 15-30 Monitor platelets by anticoagulation protocol: Yes   Plan:  No Coumadin today Daily INR Watch for s/s of bleeding  Resume Vancomycin 1750mg  IV q48 Check Vanc trough when appropriate Watch renal fxn and f/u cx results.   Gracy Bruins, PharmD Clinical Pharmacist [facility]

## 2016-09-21 NOTE — Progress Notes (Signed)
PROGRESS NOTE  William Braun  [id number] DOB: 1933-09-10  DOA: 09/14/2016 PCP: Ileana Roup, MD   Brief Narrative:  5 ?  PMH of chronic hypoxic respiratory failure copd on home oxygen 3 L/m continuously,  OSA Rt hemidiaphragm paralysis. on nightly BiPAP 14/10 cm H2O  HTN,  HLD,  DM,  CLL,  chronic r sided diastolic CHF,  Chr. atrial fibrillation CHad2Vasc2 score= 5 on Coumadin,  Lexiscan myoview (11/12) with EF 65% Prior Stroke 2008  stage III chronic kidney disease, presented to ED with 2 days history of worsening dyspnea, minimal dry cough but no chest pain, fever or chills.   In the ED, found to be hypoxic at 83% on 2 L/m oxygen, briefly on BiPAP in ED. Admitted to stepdown for possible COPD exacerbation.  Patient had gradually improved until the afternoon of 12/31 when noted progressively worsening dyspnea. CCM consulted. Back on BiPAP. slp consulted given coughing and choking on pills   Assessment & Plan:   Principal Problem:   Acute on chronic respiratory failure with hypoxia (HCC) Active Problems:   HLD (hyperlipidemia)   Essential hypertension   ATRIAL FIBRILLATION   COPD exacerbation (HCC)   Chronic diastolic CHF (congestive heart failure) (HCC)   CLL (chronic lymphocytic leukemia) (HCC)   Type II diabetes mellitus with neurological manifestations (HCC)   Elevated troponin   Hypokalemia   Acute renal failure superimposed on stage 3 chronic kidney disease ([place])   1. COPD exacerbation: Briefly required BiPAP in ED. Treated with oxygen, bronchodilator nebulizations, IV Solu-Medrol. No antibiotics initially initiated due to nonproductive minimal cough. Influenza panel PCR A&B: Negative. Urinary streptococcal antigen: Negative. Former smoker. Patient had gradually improved until the afternoon of 12/31 when noted progressively worsening dyspnea. CT chest showed probable  multifocal pneumonia--currently on IV Zosyn--will attempt to taper to po meds in the next 24  hrs 2. Acute on chronic respiratory failure with hypoxia: On home oxygen 3 L/m. Hypoxic in ED at 83% on 2 L. able to Wean oxygen to 4 L maintaining 89-92 percent. Patient had gradually improved until the afternoon of 12/31 -acute respiratory failure is multifactorial: Decompensated CHF, atelectasis and possible healthcare associated pneumonia combination/ already compromised pulmonary function (paralyzed hemidiaphragm and COPD). Steroids discontinued. Adjusted COPD meds and Duonebs, continued IV diuresis--he uses CPAP at home and I ordered this for nightly use as he was not able to tolerate BiPAP 3. Acute on Chronic diastolic CHF: 2-D echo [id number] = Pasp 56 EF 55-60% Continued metoprolol. Clinically had not appeared significantly volume overloaded and due to elevated creatinine from his baseline, diuretics had been held for approximately 2 days. Follow-up chest x-ray 12/31 and clinically suggested worsening pulmonary edema and patient was initiated on IV Lasix. Continue IV Lasix. Follow repeat 2-D echo. Is currently - 6.8 L fluid 4. Healthcare associated pneumonia, LLL: Started IV vancomycin and Zosyn per pharmacy on 09/19/2016--transitioning to augment by mouth 09/21/2016 5. Hypernatremia-with upward trend since 09/19/2016-however has resolved to 141 6. Type II DM: A1c 6.5 on 11/07/08. Holding by mouth meds. Continue SSI. Added low-dose Lantus due to worsening hyperglycemia while on steroids. Taper steroids as soon as possible. Repeat A1c: 8.1. Changed steroids to by mouth which should help. Increased Lantus to 20 units daily. Monitor closely. CBGs in the 120-140 range 7. HLD: Continue Zocor. 8. HTN: Continue amlodipine 2.5 and metoprolol 12.5. Controlled. 9. Atrial fibrillation: CHA2DS2-VASc Score is 7. I Continue Coumadin per pharmacy. Continue metoprolol. INR 4.99 on 09/20/2016 and could be secondary to  interaction with Zosyn--as no signs of bleeding, will monitor 10. Hypokalemia: Replace aggressively with  IV replacement. Follow BMP in a.m. potassium 2.8 therefore replace K Dur 40 twice a day by mouth --Magnesium to repeat with next blood 11. Acute on stage III chronic kidney disease: Baseline creatinine 1.6-1.8. Admitting creatinine 2.49. Likely due to diuretics and dehydration. Now on Lasix. See above discussion 12. CLL: Follows with outpatient oncology. Not on any medications. Worsening leukocytosis may be steroid effect. 13. Elevated troponin: Flat trend. No chest pain. Likely demand ischemia from acute respiratory issues and renal insufficiency. Continue when necessary NTG, statins and metoprolol. Follow repeat 2-D echo of 09/19/16. 14. Left knee pain: No acute findings on the exam. X-ray suggestive of osteoarthritis. Supportive treatment. Last outpatient, chronic symptoms for the last 6-8 months. Has seen orthopedics for right knee replacement in past and can follow-up outpatient. 15. NSVT: 17 beat NSVT noted on telemetry on 12/31. Asymptomatic. Continue telemetry. Replace potassium. Continue metoprolol 12.5 XL. 2-D echo 09/19/16 = Pasp 56 EF 55-60%  DVT prophylaxis: Anticoagulated on Coumadin Code Status: Full Family Communication: None at bedside Disposition Plan: Safe to transition to telemetry  Consultants:   CCM  Procedures:   BiPAP  Antimicrobials:   IV Zosyn 1/1 >  IV vancomycin 1/1 >   Subjective:  Caren Griffins is much better Carlyle Lipa T Still a little bit of cough No chest pain Knee is paining on the one side No blurred or double vision    Objective:  Vitals:   09/20/16 2233 09/21/16 0314 09/21/16 0727 09/21/16 0954  BP: 122/81 105/83 104/70   Pulse: 85 91 89   Resp: (!) 31 (!) 22 19   Temp: 98 F (36.7 C) 98.1 F (36.7 C) 98.2 F (36.8 C)   TempSrc: Oral Oral Oral   SpO2: 95% 94% 94% 92%  Weight:      Height:        Intake/Output Summary (Last 24 hours) at 09/21/16 1038 Last data filed at 09/21/16 [id number]  Gross per 24 hour  Intake              770 ml  Output              1950 ml  Net            -1180 ml   Filed Weights   09/15/16 1654  Weight: 124.8 kg (275 lb 2.2 oz)    Examination:  General exam: Pleasant elderly male, moderately built and obese, Lying propped up.   Respiratory system: Definitely worse compared to 12/31 morning.  Bilaterally basal crackles. Mild work of breathing. Cardiovascular system: S1 & S2 heard, RRR. No JVD, murmurs, rubs, gallops or clicks. No pedal edema. Telemetry: Paroxysmal A. fib with controlled ventricular rate.   Gastrointestinal system: Abdomen is nondistended, soft and nontender. No organomegaly or masses felt. Normal bowel sounds heard. Central nervous system: Alert and oriented. No focal neurological deficits. Extremities: Symmetric 5 x 5 power. Left knee not examined today Skin: No rashes, lesions or ulcers Psychiatry: Judgement and insight appear normal. Mood & affect appropriate.     Data Reviewed: I have personally reviewed following labs and imaging studies  CBC:  Recent Labs Lab 09/14/16 1857 09/16/16 0442 09/19/16 0636 09/20/16 0513 09/21/16 0715  WBC 33.4* 38.2* 49.4* 38.0* 18.0*  NEUTROABS 16.4*  --   --   --   --   HGB 15.6 15.4 17.5* 16.6 16.3  HCT 45.1 45.3 51.2 51.5 49.1  MCV 89.8  90.1 89.8 94.7 92.3  PLT 146* 179 198 167 [id number]*   Basic Metabolic Panel:  Recent Labs Lab 09/14/16 2250  09/18/16 0412 09/19/16 0636 09/19/16 1946 09/20/16 0513 09/21/16 0715  NA  --   < > 138 144 146* 149* 141  K  --   < > 2.9* 3.1* 3.8 3.1* 2.8*  CL  --   < > 87* 88* 94* 94* 90*  CO2  --   < > 38* 40* 39* 41* 38*  GLUCOSE  --   < > 142* 92 133* 121* 135*  BUN  --   < > 115* 88* 89* 87* 73*  CREATININE  --   < > 1.86* 1.69* 2.09* 2.17* 1.76*  CALCIUM  --   < > 9.4 9.9 9.5 9.7 8.8*  MG 2.3  --   --  2.5*  --   --   --   < > = values in this interval not displayed. GFR: Estimated Creatinine Clearance: 46.8 mL/min (by C-G formula based on SCr of 1.76 mg/dL (H)). Liver Function Tests: No  results for input(s): AST, ALT, ALKPHOS, BILITOT, PROT, ALBUMIN in the last 168 hours. No results for input(s): LIPASE, AMYLASE in the last 168 hours. No results for input(s): AMMONIA in the last 168 hours. Coagulation Profile:  Recent Labs Lab 09/17/16 0550 09/18/16 0412 09/19/16 0636 09/20/16 0513 09/21/16 0715  INR 2.36 2.36 3.18 4.99* 5.36*   Cardiac Enzymes:  Recent Labs Lab 09/14/16 1857 09/14/16 2250 09/15/16 0434 09/15/16 1029 09/19/16 0636  TROPONINI 0.03* 0.06* 0.04* 0.03* 0.05*   BNP (last 3 results) No results for input(s): PROBNP in the last 8760 hours. HbA1C: No results for input(s): HGBA1C in the last 72 hours. CBG:  Recent Labs Lab 09/20/16 0743 09/20/16 1146 09/20/16 1707 09/20/16 2112 09/21/16 0729  GLUCAP 126* 224* 224* 116* 148*   Lipid Profile: No results for input(s): CHOL, HDL, LDLCALC, TRIG, CHOLHDL, LDLDIRECT in the last 72 hours. Thyroid Function Tests: No results for input(s): TSH, T4TOTAL, FREET4, T3FREE, THYROIDAB in the last 72 hours. Anemia Panel: No results for input(s): VITAMINB12, FOLATE, FERRITIN, TIBC, IRON, RETICCTPCT in the last 72 hours.  Sepsis Labs: No results for input(s): PROCALCITON, LATICACIDVEN in the last 168 hours.  Recent Results (from the past 240 hour(s))  MRSA PCR Screening     Status: None   Collection Time: 09/15/16  8:05 PM  Result Value Ref Range Status   MRSA by PCR NEGATIVE NEGATIVE Final    Comment:        The GeneXpert MRSA Assay (FDA approved for NASAL specimens only), is one component of a comprehensive MRSA colonization surveillance program. It is not intended to diagnose MRSA infection nor to guide or monitor treatment for MRSA infections.   Culture, blood (routine x 2)     Status: None (Preliminary result)   Collection Time: 09/19/16  6:36 AM  Result Value Ref Range Status   Specimen Description BLOOD LEFT ANTECUBITAL  Final   Special Requests BOTTLES DRAWN AEROBIC AND ANAEROBIC 5CC   Final   Culture NO GROWTH 1 DAY  Final   Report Status PENDING  Incomplete  Culture, blood (routine x 2)     Status: None (Preliminary result)   Collection Time: 09/19/16  6:43 AM  Result Value Ref Range Status   Specimen Description BLOOD RIGHT ANTECUBITAL  Final   Special Requests BOTTLES DRAWN AEROBIC AND ANAEROBIC [place]  Final   Culture NO GROWTH 1 DAY  Final  Report Status PENDING  Incomplete         Radiology Studies: Ct Chest Wo Contrast  Result Date: 09/19/2016 CLINICAL DATA:  Hypoxemia EXAM: CT CHEST WITHOUT CONTRAST TECHNIQUE: Multidetector CT imaging of the chest was performed following the standard protocol without IV contrast. COMPARISON:  Chest x-ray today. FINDINGS: Cardiovascular: Heart is borderline enlarged. Valvular, coronary artery and aortic calcifications. No aneurysm. Mediastinum/Nodes: Numerous mildly enlarged mediastinal lymph nodes. Pre tracheal lymph node has a short axis diameter of 18 mm. Right paratracheal lymph node has a short axis diameter of 16 mm. Mildly enlarged prevascular in AP window small a subcarinal lymph nodes. Subcarinal lymph node has a short axis diameter of 18 mm. No axillary adenopathy. Difficult to assess the hila without intravenous contrast. Lungs/Pleura: Airspace disease noted in both lower lobes, left greater than right concerning for multifocal pneumonia. No pleural effusions. Upper Abdomen: Imaging into the upper abdomen shows no acute findings. Musculoskeletal: Chest wall soft tissues are unremarkable. No acute bony abnormality or focal bone lesion. IMPRESSION: Bilateral lower lobe airspace opacities, left greater than right concerning for multifocal pneumonia. Mediastinal adenopathy. This may be reactive, but recommend follow-up to ensure stability or improvement. Follow-up CT with intravenous contrast if possible could be performed in 3-6 months. Coronary artery disease, aortic atherosclerosis. Electronically Signed   By: Rolm Baptise M.D.    On: 09/19/2016 14:35   Dg Chest Port 1 View  Result Date: 09/21/2016 CLINICAL DATA:  Respiratory failure, COPD CHF. EXAM: PORTABLE CHEST 1 VIEW COMPARISON:  Chest x-ray and chest CT scan of September 19, 2016. FINDINGS: There is persistent elevation of the right hemidiaphragm. The aerated portion of the right lung appears clear. On the left mildly increased interstitial density is noted in the mid and lower lung but this is stable. There is no pleural effusion. The heart is normal in size. The pulmonary vascularity is not engorged. The mediastinum is subjectively widened but stable. The observed bony thorax is unremarkable. IMPRESSION: Improved pulmonary vascular congestion. Persistent mild interstitial prominence in the left lung consistent with residual but improving pneumonia. Persistent elevation of the right hemidiaphragm which limits evaluation of the right lung base. Electronically Signed   By: David  Martinique M.D.   On: 09/21/2016 07:43        Scheduled Meds: . amLODipine  2.5 mg Oral Daily  . arformoterol  15 mcg Nebulization BID  . budesonide (PULMICORT) nebulizer solution  0.5 mg Nebulization BID  . dextromethorphan-guaiFENesin  1 tablet Oral BID  . feeding supplement (GLUCERNA SHAKE)  237 mL Oral TID BM  . furosemide  60 mg Intravenous BID  . insulin aspart  0-15 Units Subcutaneous TID WC  . insulin aspart  0-5 Units Subcutaneous QHS  . insulin glargine  20 Units Subcutaneous Daily  . ipratropium-albuterol  3 mL Nebulization TID  . metoprolol succinate  12.5 mg Oral Daily  . piperacillin-tazobactam (ZOSYN)  IV  3.375 g Intravenous Q8H  . potassium chloride  40 mEq Oral BID  . senna-docusate  1 tablet Oral BID  . simvastatin  20 mg Oral QHS  . sodium chloride flush  3 mL Intravenous Q12H  . vancomycin  1,750 mg Intravenous Q48H  . Warfarin - Pharmacist Dosing Inpatient   Does not apply q1800   Continuous Infusions:   LOS: 7 days   Verneita Griffes, MD [facility]   If 7PM-7AM, please contact night-coverage [email or web address] Password [email or web address] 09/21/2016, 10:38 AM

## 2016-09-22 LAB — MAGNESIUM: Magnesium: 2 mg/dL (ref 1.7–2.4)

## 2016-09-22 LAB — COMPREHENSIVE METABOLIC PANEL
ALBUMIN: 2.8 g/dL — AB (ref 3.5–5.0)
ALT: 33 U/L (ref 17–63)
AST: 29 U/L (ref 15–41)
Alkaline Phosphatase: 53 U/L (ref 38–126)
Anion gap: 12 (ref 5–15)
BUN: 64 mg/dL — AB (ref 6–20)
CHLORIDE: 91 mmol/L — AB (ref 101–111)
CO2: 36 mmol/L — ABNORMAL HIGH (ref 22–32)
CREATININE: 1.71 mg/dL — AB (ref 0.61–1.24)
Calcium: 9 mg/dL (ref 8.9–10.3)
GFR calc Af Amer: 41 mL/min — ABNORMAL LOW (ref >60)
GFR calc non Af Amer: 36 mL/min — ABNORMAL LOW (ref >60)
GLUCOSE: 109 mg/dL — AB (ref 65–99)
POTASSIUM: 3.2 mmol/L — AB (ref 3.5–5.1)
Sodium: 139 mmol/L (ref 135–145)
Total Bilirubin: 1.3 mg/dL — ABNORMAL HIGH (ref 0.3–1.2)
Total Protein: 7 g/dL (ref 6.5–8.1)

## 2016-09-22 LAB — GLUCOSE, CAPILLARY
GLUCOSE-CAPILLARY: 183 mg/dL — AB (ref 65–99)
Glucose-Capillary: 109 mg/dL — ABNORMAL HIGH (ref 65–99)
Glucose-Capillary: 226 mg/dL — ABNORMAL HIGH (ref 65–99)
Glucose-Capillary: 205 mg/dL — ABNORMAL HIGH (ref 65–99)

## 2016-09-22 LAB — PROTIME-INR
INR: 4.02 — AB
PROTHROMBIN TIME: 40.2 s — AB (ref 11.4–15.2)

## 2016-09-22 MED ORDER — INSULIN STARTER KIT- PEN NEEDLES (ENGLISH)
1.0000 | Freq: Once | Status: DC
  Filled 2016-09-22: qty 1

## 2016-09-22 MED ORDER — AMOXICILLIN-POT CLAVULANATE 875-125 MG PO TABS: 1.0000 | ORAL_TABLET | Freq: Two times a day (BID) | ORAL | 0 refills | Status: DC

## 2016-09-22 MED ORDER — LIVING WELL WITH DIABETES BOOK
Freq: Once | Status: AC
  Administered 2016-09-22: 15:00:00
  Filled 2016-09-22: qty 1

## 2016-09-22 MED ORDER — INSULIN GLARGINE 100 UNIT/ML SOLOSTAR PEN: 20.0000 [IU] | PEN_INJECTOR | Freq: Every day | SUBCUTANEOUS | 11 refills | Status: DC

## 2016-09-22 MED ORDER — ARFORMOTEROL TARTRATE 15 MCG/2ML IN NEBU: 15.0000 ug | INHALATION_SOLUTION | Freq: Two times a day (BID) | RESPIRATORY_TRACT | 0 refills | Status: DC

## 2016-09-22 MED ORDER — INSULIN STARTER KIT- PEN NEEDLES (ENGLISH): 1.0000 | Freq: Once | 0 refills | Status: AC

## 2016-09-22 MED ORDER — POTASSIUM CHLORIDE CRYS ER 20 MEQ PO TBCR: 40.0000 meq | EXTENDED_RELEASE_TABLET | Freq: Two times a day (BID) | ORAL | 0 refills | Status: DC

## 2016-09-22 NOTE — Progress Notes (Signed)
Pt having SOB. Pt states he couldn't get his breath. Pt's O2 on 4L of O2 is 83%, pulled pt up in bed and O2 sat went up to 89% on 4L and pt states he is breathing much better now. Pt states his personal CPAP is not working. Notified Respiratory Therapist that pt's CPAP not working. Respiratory stated he would be up to bedside to check on CPAP. Pt is agreement to wear CPAP if respiratory can make it work. Will continue to monitor pt. Ranelle Oyster, RN

## 2016-09-22 NOTE — Discharge Summary (Addendum)
Physician Discharge Summary  ESHAWN COOR PVV:[id number] DOB: [birth year] DOA: 09/14/2016  PCP: Ileana Roup, MD  Admit date: 09/14/2016 Discharge date: 09/22/2016  Admitted From: Home Disposition:  Home  Recommendations for Outpatient Follow-up:  1. Follow up with PCP in 1-2 weeks 2. Please obtain BMP/CBC in one week 3. Will need outpatient diabetic eating as will be new to taking Lantus   Home Health: YES home health OT recommended Equipment/Devices:  2 Liters oxygen ?L  Discharge Condition: Stable CODE STATUS:  FULL Diet recommendation: Heart Healthy / Carb Modified  Brief/Interim Summary:  83 ?  PMH of chronic hypoxic respiratory failure copd on home oxygen 3 L/m continuously,  OSA Rt hemidiaphragm paralysis. on nightly BiPAP 14/10 cm H2O  HTN,  HLD,  DM,  CLL,  chronic r sided diastolic CHF,  Chr. atrial fibrillation CHad2Vasc2 score= 5 on Coumadin,  Lexiscan myoview (11/12) with EF 65% Prior Stroke 2008  stage III chronic kidney disease, presented to ED with 2 days history of worsening dyspnea, minimal dry cough but no chest pain, fever or chills.   In the ED, found to be hypoxic at 83% on 2 L/m oxygen, briefly on BiPAP in ED. Admitted to stepdown for possible COPD exacerbation.  Patient had gradually improved until the afternoon of 12/31 when noted progressively worsening dyspnea. CCM consulted. Back on BiPAP.  Discharge Diagnoses:  Principal Problem:   Acute on chronic respiratory failure with hypoxia (HCC) Active Problems:   HLD (hyperlipidemia)   Essential hypertension   ATRIAL FIBRILLATION   COPD exacerbation (HCC)   Chronic diastolic CHF (congestive heart failure) (HCC)   CLL (chronic lymphocytic leukemia) (HCC)   Type II diabetes mellitus with neurological manifestations (HCC)   Elevated troponin   Hypokalemia   Acute renal failure superimposed on stage 3 chronic kidney disease ([place])   1. COPD exacerbation: Briefly required BiPAP in ED.  Treated with oxygen, bronchodilator nebulizations, IV Solu-Medrol. No antibiotics initially initiated due to nonproductive minimal cough. Influenza panel PCR A&B: Negative. Urinary streptococcal antigen: Negative. Former smoker.12/31 noted progressively worsening dyspnea. CT chest showed probable  multifocal pneumonia--was placed on IV Zosyn and tapered to Augmentin to complete outpatient course 09/27/2016 2. Acute on chronic respiratory failure with hypoxia: On home oxygen 3 L/m. Hypoxic in ED at 83% on 2 L. able to Wean oxygen to 4 L maintaining 89-92 percent. Patient had gradually improved until the afternoon of 12/31 -acute respiratory failure is multifactorial: Decompensated CHF, atelectasis and possible healthcare associated pneumonia combination/ already compromised pulmonary function (paralyzed hemidiaphragm and COPD). Steroids discontinued. Adjusted COPD meds and Duonebs, continued IV diuresis--transitione 09/22/16 2 Augmentin orally 3. Acute on Chronic diastolic CHF: 2-D echo 03/24/43 = Pasp 56 EF 55-60% Continued metoprolol. Clinically had not appeared significantly volume overloaded and due to elevated creatinine from his baseline, diuretics had been held for approximately 2 days. Follow-up chest x-ray 12/31 and clinically suggested worsening pulmonary edema   Is currently - 5.9 L fluid 4. Healthcare associated pneumonia, LLL: Started IV vancomycin and Zosyn per pharmacy on 09/19/2016--transitioning to augment by mouth 09/21/2016 5. Hypernatremia-with upward trend since 09/19/2016-however has resolved to 141 6. Type II DM: A1c 6.5 on 11/07/08. Holding by mouth meds. Continue SSI. Added low-dose Lantus due to worsening hyperglycemia while on steroids. Taper steroids as soon as possible. Repeat A1c: 8.1. Changed steroids to by mouth which should help. Increased Lantus to 20 units daily. Monitor closely. CBGs in the 120-140 range--she needs diabetic education inpatient and out agent prior  to discharge as is  new to insulin.  She cannot take metformin going forward given creatinine 1.7 but may be able to continue Amaryl 7. HLD: Continue Zocor. 8. HTN: Continue amlodipine 2.5 and metoprolol 12.5. Controlled. 9. Atrial fibrillation: CHA2DS2-VASc Scoreis 7. I Continue Coumadin per pharmacy. Continue metoprolol. INR 4.99--> 4.0 on 09/22/16 and could be secondary to interaction with Zosyn--no signs of bleeding, needs blood count in about 2 weeks 10. Hypokalemia: Replace aggressively with IV replacement. Follow BMP in a.m. potassium 2.8 therefore replace K Dur 40 twice a day by mouth --Magnesium 2.0 09/22/16 11. Acute on stage III chronic kidney disease: Baseline creatinine 1.6-1.8. Admitting creatinine 2.49. Likely due to diuretics and dehydration. Now on Lasix. See above discussion 12. CLL: Follows with outpatient oncology. Not on any medications. Worsening leukocytosis may be steroid effect. 13. Elevated troponin: Flat trend. No chest pain. Likely demand ischemia from acute respiratory issues and renal insufficiency. Continue when necessary NTG, statins and metoprolol. Follow repeat 2-D echo of 09/19/16. 14. Left knee pain: No acute findings on the exam. X-ray suggestive of osteoarthritis. Supportive treatment. Last outpatient, chronic symptoms for the last 6-8 months. Has seen orthopedics for right knee replacement in past and can follow-up outpatient. 15. NSVT: 17 beat NSVT noted on telemetry on 12/31. Asymptomatic. Continue telemetry. Replace potassium. Continue metoprolol 12.5 XL. 2-D echo 09/19/16 = Pasp 56 EF 55-60%  Discharge Instructions  Discharge Instructions    Diet - low sodium heart healthy    Complete by:  As directed    Discharge instructions    Complete by:  As directed    Please see medication list for changes to your medicine No Metformin going forwards You will need to learn to use Lantus-we will Rx this for you You will need lab work on d/c in about 1 week or so. Please monitor ur  weight on a day-to-day basis and if he gains more than pounds in 24 hours may benefit from using an extra tablet of demadex   Increase activity slowly    Complete by:  As directed      Allergies as of 09/22/2016      Reactions   Quinine    Seizure       Medication List    STOP taking these medications   metFORMIN 500 MG tablet Commonly known as:  GLUCOPHAGE     TAKE these medications   albuterol 108 (90 Base) MCG/ACT inhaler Commonly known as:  VENTOLIN HFA Inhale 2 puffs into the lungs every 6 (six) hours as needed for wheezing or shortness of breath.   amLODipine 5 MG tablet Commonly known as:  NORVASC Take 0.5 tablets (2.5 mg total) by mouth daily.   amoxicillin-clavulanate 875-125 MG tablet Commonly known as:  AUGMENTIN Take 1 tablet by mouth every 12 (twelve) hours.   budesonide-formoterol 160-4.5 MCG/ACT inhaler Commonly known as:  SYMBICORT Inhale 2 puffs into the lungs 2 (two) times daily. 2 puffs twice daily   glimepiride 2 MG tablet Commonly known as:  AMARYL Take 4 mg by mouth daily.   HYDROcodone-acetaminophen 5-500 MG tablet Commonly known as:  VICODIN Take 2 tablets by mouth every 6 (six) hours as needed for pain.   Insulin Glargine 100 UNIT/ML Solostar Pen Commonly known as:  LANTUS Inject 20 Units into the skin daily at 10 pm.   insulin starter kit- pen needles Misc 1 kit by Other route once.   ipratropium-albuterol 0.5-2.5 (3) MG/3ML Soln Commonly known as:  DUONEB Take  3 mLs by nebulization every 6 (six) hours as needed.   metolazone 2.5 MG tablet Commonly known as:  ZAROXOLYN Metolazone 2.5 mg once every Monday Wednesday & Friday mornings   metoprolol succinate 25 MG 24 hr tablet Commonly known as:  TOPROL-XL Take 1/2 tab by mouth two times a day   nitroGLYCERIN 0.4 MG SL tablet Commonly known as:  NITROSTAT Place 1 tablet (0.4 mg total) under the tongue every 5 (five) minutes as needed for chest pain.   OXYGEN Inhale 3 L/min into  the lungs.   potassium chloride 10 MEQ tablet Commonly known as:  K-DUR Monday Wednesday Friday Take 40 meq(4 tabs) in am and 20 meq(2 tabs) in pm; Saturday Sunday Tuesday Thursday Take 20 meq(2 tabs) twice daily   potassium chloride SA 20 MEQ tablet Commonly known as:  K-DUR,KLOR-CON Take 2 tablets (40 mEq total) by mouth 2 (two) times daily.   silver sulfADIAZINE 1 % cream Commonly known as:  SILVADENE Apply 1 application topically daily.   simvastatin 20 MG tablet Commonly known as:  ZOCOR Take 20 mg by mouth at bedtime.   tiotropium 18 MCG inhalation capsule Commonly known as:  SPIRIVA Place 1 capsule (18 mcg total) into inhaler and inhale daily.   torsemide 20 MG tablet Commonly known as:  DEMADEX Take 3 tablets (60 mg total) by mouth 2 (two) times daily.   traZODone 50 MG tablet Commonly known as:  DESYREL Take 0.5 tablets (25 mg total) by mouth at bedtime as needed for sleep.   warfarin 3 MG tablet Commonly known as:  COUMADIN Take 3-6 mg by mouth as directed. 2 tabs by mouth on sat, sun,  1.5 tabs wed, all other days 1 tablet      Follow-up Information    Magdalen Spatz, NP Follow up on 10/05/2016.   Specialty:  Pulmonary Disease Why:  Follow up with lung doctors at 2:30 pm Contact information: [address] [place] [address] [phone]          Allergies  Allergen Reactions  . Quinine     Seizure     Consultations: Pulmonology  Procedures/Studies: Dg Chest 2 View  Result Date: 09/14/2016 CLINICAL DATA:  83 year old male with shortness breath and chest pain. EXAM: CHEST  2 VIEW COMPARISON:  Chest x-ray a 05/30/2016. FINDINGS: Lung volumes are low. Persistent elevation of the right hemidiaphragm is unchanged. No acute consolidative airspace disease. No pleural effusions. No evidence of pulmonary edema. Heart size appears borderline enlarged. The patient is rotated to the right on today's exam, resulting in distortion of the  mediastinal contours and reduced diagnostic sensitivity and specificity for mediastinal pathology. IMPRESSION: 1. Low lung volumes without radiographic evidence of acute cardiopulmonary disease. Electronically Signed   By: Vinnie Langton M.D.   On: 09/14/2016 15:09   Dg Knee 1-2 Views Left  Result Date: 09/14/2016 CLINICAL DATA:  Chronic pain and swelling.  No injury. EXAM: LEFT KNEE - 1-2 VIEW COMPARISON:  None. FINDINGS: No acute fracture deformity or dislocation. Severe medial compartment narrowing with periarticular sclerosis and marginal spurring. Mild patellofemoral and lateral compartment marginal spurring. No destructive bony lesions. Moderate suprapatellar joint effusion. Mild vascular calcifications. Scattered popliteal phleboliths. IMPRESSION: Severe medial compartment osteoarthrosis. Moderate suprapatellar joint effusion. Electronically Signed   By: Elon Alas M.D.   On: 09/14/2016 15:07   Ct Chest Wo Contrast  Result Date: 09/19/2016 CLINICAL DATA:  Hypoxemia EXAM: CT CHEST WITHOUT CONTRAST TECHNIQUE: Multidetector CT imaging of  the chest was performed following the standard protocol without IV contrast. COMPARISON:  Chest x-ray today. FINDINGS: Cardiovascular: Heart is borderline enlarged. Valvular, coronary artery and aortic calcifications. No aneurysm. Mediastinum/Nodes: Numerous mildly enlarged mediastinal lymph nodes. Pre tracheal lymph node has a short axis diameter of 18 mm. Right paratracheal lymph node has a short axis diameter of 16 mm. Mildly enlarged prevascular in AP window small a subcarinal lymph nodes. Subcarinal lymph node has a short axis diameter of 18 mm. No axillary adenopathy. Difficult to assess the hila without intravenous contrast. Lungs/Pleura: Airspace disease noted in both lower lobes, left greater than right concerning for multifocal pneumonia. No pleural effusions. Upper Abdomen: Imaging into the upper abdomen shows no acute findings. Musculoskeletal: Chest  wall soft tissues are unremarkable. No acute bony abnormality or focal bone lesion. IMPRESSION: Bilateral lower lobe airspace opacities, left greater than right concerning for multifocal pneumonia. Mediastinal adenopathy. This may be reactive, but recommend follow-up to ensure stability or improvement. Follow-up CT with intravenous contrast if possible could be performed in 3-6 months. Coronary artery disease, aortic atherosclerosis. Electronically Signed   By: Rolm Baptise M.D.   On: 09/19/2016 14:35   Dg Chest Port 1 View  Result Date: 09/21/2016 CLINICAL DATA:  Respiratory failure, COPD CHF. EXAM: PORTABLE CHEST 1 VIEW COMPARISON:  Chest x-ray and chest CT scan of September 19, 2016. FINDINGS: There is persistent elevation of the right hemidiaphragm. The aerated portion of the right lung appears clear. On the left mildly increased interstitial density is noted in the mid and lower lung but this is stable. There is no pleural effusion. The heart is normal in size. The pulmonary vascularity is not engorged. The mediastinum is subjectively widened but stable. The observed bony thorax is unremarkable. IMPRESSION: Improved pulmonary vascular congestion. Persistent mild interstitial prominence in the left lung consistent with residual but improving pneumonia. Persistent elevation of the right hemidiaphragm which limits evaluation of the right lung base. Electronically Signed   By: David  Martinique M.D.   On: 09/21/2016 07:43   Dg Chest Port 1 View  Result Date: 09/18/2016 CLINICAL DATA:  COPD exacerbation EXAM: PORTABLE CHEST 1 VIEW COMPARISON:  September 14, 2016 FINDINGS: The heart size and mediastinal contours are stable. The heart size is enlarged. The aorta is tortuous. There is pulmonary edema. There is no focal pneumonia or pleural effusion. Elevation of right hemidiaphragm is unchanged. The visualized skeletal structures are stable. IMPRESSION: Cardiomegaly with pulmonary edema. Electronically Signed   By:  Abelardo Diesel M.D.   On: 09/18/2016 15:32   Dg Chest Port 1 View  Result Date: 09/19/2016 CLINICAL DATA:  Hypoxia. EXAM: PORTABLE CHEST 1 VIEW COMPARISON:  Chest radiograph September 18, 2016 FINDINGS: The cardiac silhouette appears mildly enlarged. Mediastinal silhouette is nonsuspicious. Similar pulmonary vascular congestion. Persistently elevated RIGHT hemidiaphragm. Patchy LEFT lung base airspace opacity. No pneumothorax. Soft tissue planes and included osseous structures are nonsuspicious. IMPRESSION: Similar cardiomegaly and pulmonary vascular congestion. LEFT lung base atelectasis or pneumonia. Electronically Signed   By: Elon Alas M.D.   On: 09/19/2016 05:11    (Echo, Carotid, EGD, Colonoscopy, ERCP)    Subjective:   Discharge Exam: Vitals:   09/22/16 0618 09/22/16 1300  BP:  122/63  Pulse:  81  Resp:  20  Temp: 97.4 F (36.3 C) 98.2 F (36.8 C)   Vitals:   09/22/16 0502 09/22/16 0618 09/22/16 0801 09/22/16 1300  BP: 114/81   122/63  Pulse: 76   81  Resp: 20  20  Temp:  97.4 F (36.3 C)  98.2 F (36.8 C)  TempSrc:  Oral  Oral  SpO2: 93%  91% 91%  Weight:      Height:        General: Pt is alert, awake, not in acute distress-Ray oxygen and verbalizing well without any difficulty or accessory muscle use Cardiovascular: RRR, S1/S2 +, no rubs, no gallops, no lower extremity edema Respiratory: CTA bilaterally, no wheezing, no rhonchi Abdominal: Soft, NT, ND, bowel sounds + Extremities: no edema, no cyanosis    The results of significant diagnostics from this hospitalization (including imaging, microbiology, ancillary and laboratory) are listed below for reference.     Microbiology: Recent Results (from the past 240 hour(s))  MRSA PCR Screening     Status: None   Collection Time: 09/15/16  8:05 PM  Result Value Ref Range Status   MRSA by PCR NEGATIVE NEGATIVE Final    Comment:        The GeneXpert MRSA Assay (FDA approved for NASAL specimens only), is  one component of a comprehensive MRSA colonization surveillance program. It is not intended to diagnose MRSA infection nor to guide or monitor treatment for MRSA infections.   Culture, blood (routine x 2)     Status: None (Preliminary result)   Collection Time: 09/19/16  6:36 AM  Result Value Ref Range Status   Specimen Description BLOOD LEFT ANTECUBITAL  Final   Special Requests BOTTLES DRAWN AEROBIC AND ANAEROBIC 5CC  Final   Culture NO GROWTH 2 DAYS  Final   Report Status PENDING  Incomplete  Culture, blood (routine x 2)     Status: None (Preliminary result)   Collection Time: 09/19/16  6:43 AM  Result Value Ref Range Status   Specimen Description BLOOD RIGHT ANTECUBITAL  Final   Special Requests BOTTLES DRAWN AEROBIC AND ANAEROBIC [place]  Final   Culture NO GROWTH 2 DAYS  Final   Report Status PENDING  Incomplete     Labs: BNP (last 3 results)  Recent Labs  11/30/15 1153 04/18/16 1000 09/14/16 1857  BNP 56.5 95.4 737.1*   Basic Metabolic Panel:  Recent Labs Lab 09/19/16 0636 09/19/16 1946 09/20/16 0513 09/21/16 0715 09/22/16 0437  NA 144 146* 149* 141 139  K 3.1* 3.8 3.1* 2.8* 3.2*  CL 88* 94* 94* 90* 91*  CO2 40* 39* 41* 38* 36*  GLUCOSE 92 133* 121* 135* 109*  BUN 88* 89* 87* 73* 64*  CREATININE 1.69* 2.09* 2.17* 1.76* 1.71*  CALCIUM 9.9 9.5 9.7 8.8* 9.0  MG 2.5*  --   --   --  2.0   Liver Function Tests:  Recent Labs Lab 09/22/16 0437  AST 29  ALT 33  ALKPHOS 53  BILITOT 1.3*  PROT 7.0  ALBUMIN 2.8*   No results for input(s): LIPASE, AMYLASE in the last 168 hours. No results for input(s): AMMONIA in the last 168 hours. CBC:  Recent Labs Lab 09/16/16 0442 09/19/16 0636 09/20/16 0513 09/21/16 0715 09/21/16 1215  WBC 38.2* 49.4* 38.0* 18.0* 13.9*  NEUTROABS  --   --   --   --  7.8*  HGB 15.4 17.5* 16.6 16.3 16.5  HCT 45.3 51.2 51.5 49.1 48.6  MCV 90.1 89.8 94.7 92.3 90.8  PLT 179 198 167 148* 192   Cardiac Enzymes:  Recent  Labs Lab 09/19/16 0636  TROPONINI 0.05*   BNP: Invalid input(s): POCBNP CBG:  Recent Labs Lab 09/21/16 1132 09/21/16 1521 09/21/16 2138 09/22/16 0805 09/22/16  1159  GLUCAP 283* 196* 145* 109* 226*   D-Dimer No results for input(s): DDIMER in the last 72 hours. Hgb A1c No results for input(s): HGBA1C in the last 72 hours. Lipid Profile No results for input(s): CHOL, HDL, LDLCALC, TRIG, CHOLHDL, LDLDIRECT in the last 72 hours. Thyroid function studies No results for input(s): TSH, T4TOTAL, T3FREE, THYROIDAB in the last 72 hours.  Invalid input(s): FREET3 Anemia work up No results for input(s): VITAMINB12, FOLATE, FERRITIN, TIBC, IRON, RETICCTPCT in the last 72 hours. Urinalysis    Component Value Date/Time   COLORURINE YELLOW 09/18/2010 1120   APPEARANCEUR CLOUDY (A) 09/18/2010 1120   LABSPEC 1.017 09/18/2010 1120   PHURINE 5.0 09/18/2010 1120   GLUCOSEU NEGATIVE 09/18/2010 1120   HGBUR LARGE (A) 09/18/2010 [place] 09/18/2010 1120   KETONESUR NEGATIVE 09/18/2010 1120   PROTEINUR NEGATIVE 09/18/2010 1120   UROBILINOGEN 0.2 09/18/2010 1120   NITRITE POSITIVE (A) 09/18/2010 1120   LEUKOCYTESUR SMALL (A) 09/18/2010 1120   Sepsis Labs Invalid input(s): PROCALCITONIN,  WBC,  LACTICIDVEN Microbiology Recent Results (from the past 240 hour(s))  MRSA PCR Screening     Status: None   Collection Time: 09/15/16  8:05 PM  Result Value Ref Range Status   MRSA by PCR NEGATIVE NEGATIVE Final    Comment:        The GeneXpert MRSA Assay (FDA approved for NASAL specimens only), is one component of a comprehensive MRSA colonization surveillance program. It is not intended to diagnose MRSA infection nor to guide or monitor treatment for MRSA infections.   Culture, blood (routine x 2)     Status: None (Preliminary result)   Collection Time: 09/19/16  6:36 AM  Result Value Ref Range Status   Specimen Description BLOOD LEFT ANTECUBITAL  Final   Special  Requests BOTTLES DRAWN AEROBIC AND ANAEROBIC 5CC  Final   Culture NO GROWTH 2 DAYS  Final   Report Status PENDING  Incomplete  Culture, blood (routine x 2)     Status: None (Preliminary result)   Collection Time: 09/19/16  6:43 AM  Result Value Ref Range Status   Specimen Description BLOOD RIGHT ANTECUBITAL  Final   Special Requests BOTTLES DRAWN AEROBIC AND ANAEROBIC [place]  Final   Culture NO GROWTH 2 DAYS  Final   Report Status PENDING  Incomplete     Time coordinating discharge: Over 30 minutes  SIGNED:   Nita Sells, MD  [facility] 09/22/2016, 2:04 PM Pager   If 7PM-7AM, please contact night-coverage [email or web address] Password [email or web address]

## 2016-09-22 NOTE — Progress Notes (Signed)
ANTICOAGULATION CONSULT NOTE - Follow Up Consult  Pharmacy Consult for Coumadin Indication: atrial fibrillation  Allergies  Allergen Reactions  . Quinine     Seizure     Patient Measurements: Height: 6\' 3"  (190.5 cm) Weight: 275 lb 2.2 oz (124.8 kg) IBW/kg (Calculated) : 84.5  Vital Signs: Temp: 98.2 F (36.8 C) (01/04 1300) Temp Source: Oral (01/04 1300) BP: 122/63 (01/04 1300) Pulse Rate: 81 (01/04 1300)  Labs:  Recent Labs  09/20/16 0513 09/21/16 0715 09/21/16 1215 09/22/16 0437  HGB 16.6 16.3 16.5  --   HCT 51.5 49.1 48.6  --   PLT 167 148* 192  --   LABPROT 47.7* 50.6*  --  40.2*  INR 4.99* 5.36*  --  4.02*  CREATININE 2.17* 1.76*  --  1.71*    Estimated Creatinine Clearance: 48.2 mL/min (by C-G formula based on SCr of 1.71 mg/dL (H)).    Assessment: 84yo male on Coumadin for AFib.  INR remains supratherapeutic today at 4.02 after dose held the past 2 days. Last coumadin dose given 1/1.   CBC is OK, no bleeding noted.  Goal of Therapy:  INR 2-3 Monitor platelets by anticoagulation protocol: Yes   Plan:  Continue to hold Coumadin today Continue daily INR  Eudelia Bunch, Pharm.D. BP:[phone] 09/22/2016 1:45 PM

## 2016-09-22 NOTE — Progress Notes (Signed)
Spoke with patient and wife about the solostar insulin pen. Demonstrated use of the pen. Patient to give own injection this evening with syringe. Wife to check with pharmacy on cost of insulin pens. Talked with patient briefly about hypoglycemia, symptoms, and treatment; HgbA1C, general DM care. Wife states that outpatient DM education would be difficult right now to get him there. Will consider at a later date. Will follow up with patient before discharge. Harvel Ricks RN BSN CDE

## 2016-09-22 NOTE — Progress Notes (Signed)
Respiratory came to bedside. Pt has CPAP on now and resting comfortably. Will continue to monitor pt. Ranelle Oyster, RN

## 2016-09-22 NOTE — Progress Notes (Signed)
Pt states his CPAP is leaking. Tired to fix CPAP from leaking, unsuccessful. Called respiratory, stated to just put back on nasal cannula. Will continue to monitor pt. Ranelle Oyster, RN

## 2016-09-22 NOTE — Progress Notes (Signed)
CRITICAL VALUE ALERT  Critical value received:  INR 4.02  Date of notification:  09/22/16  Time of notification:  0528  Critical value read back:Yes.    Nurse who received alert:  Ranelle Oyster, RN  MD notified (1st page):  Schorr, NP  Time of first page:  [phone]  MD notified (2nd page):  Time of second page:  Responding MD: none, INR decreased from 09/21/16  Time MD responded:  none

## 2016-09-23 LAB — PROTIME-INR
INR: 2.6
Prothrombin Time: 28.3 seconds — ABNORMAL HIGH (ref 11.4–15.2)

## 2016-09-23 LAB — GLUCOSE, CAPILLARY
Glucose-Capillary: 154 mg/dL — ABNORMAL HIGH (ref 65–99)
Glucose-Capillary: 262 mg/dL — ABNORMAL HIGH (ref 65–99)

## 2016-09-23 MED ORDER — INSULIN STARTER KIT- PEN NEEDLES (ENGLISH)
1.0000 | Freq: Once | Status: AC
  Administered 2016-09-23: 1
  Filled 2016-09-23: qty 1

## 2016-09-23 MED ORDER — INSULIN ASPART 100 UNIT/ML ~~LOC~~ SOLN
0.0000 [IU] | Freq: Three times a day (TID) | SUBCUTANEOUS | Status: DC
  Administered 2016-09-23: 8 [IU] via SUBCUTANEOUS
  Administered 2016-09-23: 3 [IU] via SUBCUTANEOUS

## 2016-09-23 MED ORDER — WARFARIN SODIUM 3 MG PO TABS
3.0000 mg | ORAL_TABLET | Freq: Once | ORAL | Status: DC
  Filled 2016-09-23: qty 1

## 2016-09-23 NOTE — Care Management Note (Signed)
Case Management Note  Patient Details  Name: William Braun MRN: [id number] Date of Birth: 08/22/1933  Subjective/Objective:    Acute resp failure, COPD exac, HTN, DM                Action/Plan: Discharge Planning: please see previous NCM notes  NCM spoke to pt and wife, William Braun at bedside. Wife states pt has oxygen and neb machine at home. Has portable in room but empty. Contacted [facility] for portable to go home. Pt has oxygen with [facility]. Spoke to attending and [place] dc and AVS updated. Pt copay was $188. Pt's wife states they can afford $90 for Lantus Solostar Pens. Unit RN to do teaching on how to use pen.   PCP William Dawson MD   Expected Discharge Date:  09/18/16               Expected Discharge Plan:  [place]  In-House Referral:  NA  Discharge planning Services  CM Consult  Post Acute Care Choice:  Home Health Choice offered to:  Spouse  DME Arranged:  N/A DME Agency:  NA  HH Arranged:  RN, PT William Braun Agency:  [place]  Status of Service:  Completed, signed off  If discussed at [place] of Stay Meetings, dates discussed:    Additional Comments:  Erenest Rasher, RN 09/23/2016, 2:47 PM

## 2016-09-23 NOTE — Care Management Important Message (Signed)
Important Message  Patient Details  Name: FINLAY SALONEN MRN: [id number] Date of Birth: 1933/02/19   Medicare Important Message Given:  Yes    Erenest Rasher, RN 09/23/2016, 3:10 PM

## 2016-09-23 NOTE — Progress Notes (Signed)
PT Cancellation Note  Patient Details Name: PINCUS BALYEAT MRN: [id number] DOB: [birth year]   Cancelled Treatment:    Reason Eval/Treat Not Completed: Other (comment) (refused due to impending discharge).  Asked pt and wife to let rehab know if they change their minds.   Ramond Dial 09/23/2016, 1:28 PM   Mee Hives, PT MS Acute Rehab Dept. Number: [place] [facility] [place]

## 2016-09-23 NOTE — Progress Notes (Signed)
NURSING PROGRESS NOTE  William Braun [id number] Discharge Data: 09/23/2016 5:33 PM Attending Provider: Nita Sells, MD [id number] William Cumins, MD     William Braun to be D/C'd Home per MD order.  Discussed with the patient and his wife the After Visit Summary and all questions fully answered. All IV's discontinued with no bleeding noted. All belongings returned to patient for patient to take home. Showed the wife with a demo insulin pen how to use it.   Last Vital Signs:  Blood pressure 116/74, pulse 85, temperature 97.5 F (36.4 C), temperature source Oral, resp. rate 18, height 6' 3"  (1.905 m), weight 124.8 kg (275 lb 2.2 oz), SpO2 91 %.  Discharge Medication List Allergies as of 09/23/2016      Reactions   Quinine    Seizure       Medication List    STOP taking these medications   metFORMIN 500 MG tablet Commonly known as:  GLUCOPHAGE     TAKE these medications   albuterol 108 (90 Base) MCG/ACT inhaler Commonly known as:  VENTOLIN HFA Inhale 2 puffs into the lungs every 6 (six) hours as needed for wheezing or shortness of breath.   amLODipine 5 MG tablet Commonly known as:  NORVASC Take 0.5 tablets (2.5 mg total) by mouth daily.   amoxicillin-clavulanate 875-125 MG tablet Commonly known as:  AUGMENTIN Take 1 tablet by mouth every 12 (twelve) hours.   budesonide-formoterol 160-4.5 MCG/ACT inhaler Commonly known as:  SYMBICORT Inhale 2 puffs into the lungs 2 (two) times daily. 2 puffs twice daily   glimepiride 2 MG tablet Commonly known as:  AMARYL Take 4 mg by mouth daily.   HYDROcodone-acetaminophen 5-500 MG tablet Commonly known as:  VICODIN Take 2 tablets by mouth every 6 (six) hours as needed for pain.   Insulin Glargine 100 UNIT/ML Solostar Pen Commonly known as:  LANTUS Inject 20 Units into the skin daily at 10 pm.   ipratropium-albuterol 0.5-2.5 (3) MG/3ML Soln Commonly known as:  DUONEB Take 3 mLs by nebulization every 6 (six) hours as needed.    metolazone 2.5 MG tablet Commonly known as:  ZAROXOLYN Metolazone 2.5 mg once every Monday Wednesday & Friday mornings   metoprolol succinate 25 MG 24 hr tablet Commonly known as:  TOPROL-XL Take 1/2 tab by mouth two times a day   nitroGLYCERIN 0.4 MG SL tablet Commonly known as:  NITROSTAT Place 1 tablet (0.4 mg total) under the tongue every 5 (five) minutes as needed for chest pain.   OXYGEN Inhale 3 L/min into the lungs.   potassium chloride 10 MEQ tablet Commonly known as:  K-DUR Monday Wednesday Friday Take 40 meq(4 tabs) in am and 20 meq(2 tabs) in pm; Saturday Sunday Tuesday Thursday Take 20 meq(2 tabs) twice daily   potassium chloride SA 20 MEQ tablet Commonly known as:  K-DUR,KLOR-CON Take 2 tablets (40 mEq total) by mouth 2 (two) times daily.   silver sulfADIAZINE 1 % cream Commonly known as:  SILVADENE Apply 1 application topically daily.   simvastatin 20 MG tablet Commonly known as:  ZOCOR Take 20 mg by mouth at bedtime.   tiotropium 18 MCG inhalation capsule Commonly known as:  SPIRIVA Place 1 capsule (18 mcg total) into inhaler and inhale daily.   torsemide 20 MG tablet Commonly known as:  DEMADEX Take 3 tablets (60 mg total) by mouth 2 (two) times daily.   traZODone 50 MG tablet Commonly known as:  DESYREL Take 0.5 tablets (25 mg  total) by mouth at bedtime as needed for sleep.   warfarin 3 MG tablet Commonly known as:  COUMADIN Take 3-6 mg by mouth as directed. 2 tabs by mouth on sat, sun,  1.5 tabs wed, all other days 1 tablet     ASK your doctor about these medications   insulin starter kit- pen needles Misc 1 kit by Other route once. Ask about: Should I take this medication?

## 2016-09-23 NOTE — Progress Notes (Signed)
ANTICOAGULATION CONSULT NOTE - Follow Up Consult  Pharmacy Consult for Coumadin Indication: atrial fibrillation  Allergies  Allergen Reactions  . Quinine     Seizure     Patient Measurements: Height: 6\' 3"  (190.5 cm) Weight: 275 lb 2.2 oz (124.8 kg) IBW/kg (Calculated) : 84.5  Vital Signs: Temp: 97.5 F (36.4 C) (01/05 0546) Temp Source: Oral (01/05 0546) BP: 116/74 (01/05 0546) Pulse Rate: 85 (01/05 0546)  Labs:  Recent Labs  09/21/16 0715 09/21/16 1215 09/22/16 0437 09/23/16 0600  HGB 16.3 16.5  --   --   HCT 49.1 48.6  --   --   PLT 148* 192  --   --   LABPROT 50.6*  --  40.2* 28.3*  INR 5.36*  --  4.02* 2.60  CREATININE 1.76*  --  1.71*  --     Estimated Creatinine Clearance: 48.2 mL/min (by C-G formula based on SCr of 1.71 mg/dL (H)).    Assessment: 85 YOM who continues on warfarin for hx Afib. INR today has trended back down to the therapeutic range after holding for 3 days (INR 2.6 << 4.02, goal of 2-3). Last warfarin dose was on 1/1. No CBC this AM however appears stable from 1/3 labs - no overt s/sx of bleeding noted.   The patient has been re-educated on warfarin this admission. Noted plans for possible discharge soon.  Goal of Therapy:  INR 2-3 Monitor platelets by anticoagulation protocol: Yes   Plan:  1. Warfarin 3 mg x 1 dose at 1800 today (if still here) 2. Will continue to monitor for any signs/symptoms of bleeding and will follow up with PT/INR in the a.m (if still here)  Thank you for allowing pharmacy to be a part of this patient's care.  Alycia Rossetti, PharmD, BCPS Clinical Pharmacist Pager: [phone] Clinical phone for 09/23/2016 from 7a-3:30p: [phone] If after 3:30p, please call main pharmacy at: [phone] 09/23/2016 9:19 AM

## 2016-09-23 NOTE — Progress Notes (Signed)
See d/c summary yesterday No acute changes Will d/c home today   Verneita Griffes, MD [facility] [phone]

## 2016-09-24 LAB — CULTURE, BLOOD (ROUTINE X 2)
CULTURE: NO GROWTH
Culture: NO GROWTH

## 2016-09-27 DIAGNOSIS — I130 Hypertensive heart and chronic kidney disease with heart failure and stage 1 through stage 4 chronic kidney disease, or unspecified chronic kidney disease: Secondary | ICD-10-CM | POA: Diagnosis not present

## 2016-09-27 DIAGNOSIS — C9111 Chronic lymphocytic leukemia of B-cell type in remission: Secondary | ICD-10-CM | POA: Diagnosis not present

## 2016-09-27 DIAGNOSIS — Z87891 Personal history of nicotine dependence: Secondary | ICD-10-CM | POA: Diagnosis not present

## 2016-09-27 DIAGNOSIS — I5033 Acute on chronic diastolic (congestive) heart failure: Secondary | ICD-10-CM | POA: Diagnosis not present

## 2016-09-27 DIAGNOSIS — Z9981 Dependence on supplemental oxygen: Secondary | ICD-10-CM | POA: Diagnosis not present

## 2016-09-27 DIAGNOSIS — Z7901 Long term (current) use of anticoagulants: Secondary | ICD-10-CM | POA: Diagnosis not present

## 2016-09-27 DIAGNOSIS — I482 Chronic atrial fibrillation: Secondary | ICD-10-CM | POA: Diagnosis not present

## 2016-09-27 DIAGNOSIS — J189 Pneumonia, unspecified organism: Secondary | ICD-10-CM | POA: Diagnosis not present

## 2016-09-27 DIAGNOSIS — M1712 Unilateral primary osteoarthritis, left knee: Secondary | ICD-10-CM | POA: Diagnosis not present

## 2016-09-27 DIAGNOSIS — J440 Chronic obstructive pulmonary disease with acute lower respiratory infection: Secondary | ICD-10-CM | POA: Diagnosis not present

## 2016-09-27 DIAGNOSIS — N183 Chronic kidney disease, stage 3 (moderate): Secondary | ICD-10-CM | POA: Diagnosis not present

## 2016-09-27 DIAGNOSIS — J9621 Acute and chronic respiratory failure with hypoxia: Secondary | ICD-10-CM | POA: Diagnosis not present

## 2016-09-27 DIAGNOSIS — Z794 Long term (current) use of insulin: Secondary | ICD-10-CM | POA: Diagnosis not present

## 2016-09-27 DIAGNOSIS — J441 Chronic obstructive pulmonary disease with (acute) exacerbation: Secondary | ICD-10-CM | POA: Diagnosis not present

## 2016-09-29 ENCOUNTER — Other Ambulatory Visit (HOSPITAL_BASED_OUTPATIENT_CLINIC_OR_DEPARTMENT_OTHER): Payer: PPO

## 2016-09-29 ENCOUNTER — Ambulatory Visit (HOSPITAL_BASED_OUTPATIENT_CLINIC_OR_DEPARTMENT_OTHER): Payer: PPO | Admitting: Hematology & Oncology

## 2016-09-29 VITALS — BP 99/55 | HR 84 | Temp 97.9°F | Resp 18 | Wt 260.0 lb

## 2016-09-29 DIAGNOSIS — E119 Type 2 diabetes mellitus without complications: Secondary | ICD-10-CM

## 2016-09-29 DIAGNOSIS — I509 Heart failure, unspecified: Secondary | ICD-10-CM

## 2016-09-29 DIAGNOSIS — I4891 Unspecified atrial fibrillation: Secondary | ICD-10-CM

## 2016-09-29 DIAGNOSIS — D72829 Elevated white blood cell count, unspecified: Secondary | ICD-10-CM

## 2016-09-29 DIAGNOSIS — C9110 Chronic lymphocytic leukemia of B-cell type not having achieved remission: Secondary | ICD-10-CM

## 2016-09-29 LAB — CMP (CANCER CENTER ONLY)
ALT: 50 U/L — AB (ref 10–47)
AST: 40 U/L — AB (ref 11–38)
Albumin: 3.6 g/dL (ref 3.3–5.5)
Alkaline Phosphatase: 72 U/L (ref 26–84)
BILIRUBIN TOTAL: 1 mg/dL (ref 0.20–1.60)
BUN: 50 mg/dL — AB (ref 7–22)
CALCIUM: 9.6 mg/dL (ref 8.0–10.3)
CO2: 33 mEq/L (ref 18–33)
Chloride: 88 mEq/L — ABNORMAL LOW (ref 98–108)
Creat: 1.6 mg/dl — ABNORMAL HIGH (ref 0.6–1.2)
Glucose, Bld: 135 mg/dL — ABNORMAL HIGH (ref 73–118)
POTASSIUM: 3.6 meq/L (ref 3.3–4.7)
Sodium: 139 mEq/L (ref 128–145)
TOTAL PROTEIN: 7.8 g/dL (ref 6.4–8.1)

## 2016-09-29 LAB — CBC WITH DIFFERENTIAL (CANCER CENTER ONLY)
HEMATOCRIT: 47.5 % (ref 38.7–49.9)
HGB: 16.3 g/dL (ref 13.0–17.1)
MCH: 30.5 pg (ref 28.0–33.4)
MCHC: 34.3 g/dL (ref 32.0–35.9)
MCV: 89 fL (ref 82–98)
Platelets: 162 10*3/uL (ref 145–400)
RBC: 5.35 10*6/uL (ref 4.20–5.70)
RDW: 18.7 % — ABNORMAL HIGH (ref 11.1–15.7)
WBC: 36.2 10*3/uL — AB (ref 4.0–10.0)

## 2016-09-29 LAB — MANUAL DIFFERENTIAL (CHCC SATELLITE)
ALC: 9.4 10*3/uL — ABNORMAL HIGH (ref 0.9–3.3)
ANC (CHCC HP manual diff): 24.3 10*3/uL — ABNORMAL HIGH (ref 1.5–6.5)
BASO: 1 % (ref 0–2)
Eos: 2 % (ref 0–7)
LYMPH: 26 % (ref 14–48)
METAMYELOCYTES PCT: 1 % — AB (ref 0–0)
MONO: 4 % (ref 0–13)
PLT EST ~~LOC~~: ADEQUATE
RBC COMMENTS: NORMAL
SEG: 66 % (ref 40–75)

## 2016-09-29 LAB — CHCC SATELLITE - SMEAR

## 2016-09-30 LAB — KAPPA/LAMBDA LIGHT CHAINS
Ig Kappa Free Light Chain: 66.2 mg/L — ABNORMAL HIGH (ref 3.3–19.4)
Ig Lambda Free Light Chain: 68 mg/L — ABNORMAL HIGH (ref 5.7–26.3)
Kappa/Lambda FluidC Ratio: 0.97 (ref 0.26–1.65)

## 2016-09-30 LAB — BETA 2 MICROGLOBULIN, SERUM: Beta-2: 5.8 mg/L — ABNORMAL HIGH (ref 0.6–2.4)

## 2016-09-30 NOTE — Progress Notes (Signed)
Hematology and Oncology Follow Up Visit  BILLYE HOPPA [id number] 06/30/1933 83 y.o. 09/30/2016   Principle Diagnosis:  CLL-stage A  Current Therapy:   Observation    Interim History:  Mr. Borjas is here today with his wife for a follow-up. He was recently hospitalized. He had pneumonia. He was on antibiotics. Thankfully got through this. He did not have any issues with congestive heart failure. His white cell count was elevated but seemed to come down a little bit.  He is hospitalized for about 8 days. This was right after Christmas.  Currently, he seems be doing okay. He comes in a wheelchair.  He was put on some antibiotics for home.  He's had no problems with congestive heart failure. He had an echocardiogram done on January 1. He had a good LVEF of 55-60%.    He is on supplemental oxygen.  He is on Coumadin. He has atrial fibrillation.  He is on  insulin for diabetes.   Currently, his performance status is ECOG 3.  Medications:  Allergies as of 09/29/2016      Reactions   Quinine    Seizure       Medication List       Accurate as of 09/29/16 11:59 PM. Always use your most recent med list.          albuterol 108 (90 Base) MCG/ACT inhaler Commonly known as:  VENTOLIN HFA Inhale 2 puffs into the lungs every 6 (six) hours as needed for wheezing or shortness of breath.   budesonide-formoterol 160-4.5 MCG/ACT inhaler Commonly known as:  SYMBICORT Inhale 2 puffs into the lungs 2 (two) times daily. 2 puffs twice daily   glimepiride 2 MG tablet Commonly known as:  AMARYL Take 4 mg by mouth daily.   HYDROcodone-acetaminophen 5-500 MG tablet Commonly known as:  VICODIN Take 2 tablets by mouth every 6 (six) hours as needed for pain.   Insulin Glargine 100 UNIT/ML Solostar Pen Commonly known as:  LANTUS Inject 20 Units into the skin daily at 10 pm.   ipratropium-albuterol 0.5-2.5 (3) MG/3ML Soln Commonly known as:  DUONEB Take 3 mLs by nebulization every 6 (six)  hours as needed.   metolazone 2.5 MG tablet Commonly known as:  ZAROXOLYN Metolazone 2.5 mg once every Monday Wednesday & Friday mornings   metoprolol succinate 25 MG 24 hr tablet Commonly known as:  TOPROL-XL Take 1/2 tab by mouth two times a day   nitroGLYCERIN 0.4 MG SL tablet Commonly known as:  NITROSTAT Place 1 tablet (0.4 mg total) under the tongue every 5 (five) minutes as needed for chest pain.   OXYGEN Inhale 3 L/min into the lungs.   potassium chloride SA 20 MEQ tablet Commonly known as:  K-DUR,KLOR-CON Take 40 mEq by mouth 2 (two) times daily.   potassium chloride SA 20 MEQ tablet Commonly known as:  K-DUR,KLOR-CON Take 2 tablets (40 mEq total) by mouth 2 (two) times daily.   silver sulfADIAZINE 1 % cream Commonly known as:  SILVADENE Apply 1 application topically daily.   simvastatin 20 MG tablet Commonly known as:  ZOCOR Take 20 mg by mouth at bedtime.   tiotropium 18 MCG inhalation capsule Commonly known as:  SPIRIVA Place 1 capsule (18 mcg total) into inhaler and inhale daily.   torsemide 20 MG tablet Commonly known as:  DEMADEX Take 3 tablets (60 mg total) by mouth 2 (two) times daily.   traZODone 50 MG tablet Commonly known as:  DESYREL Take 0.5 tablets (  25 mg total) by mouth at bedtime as needed for sleep.   warfarin 3 MG tablet Commonly known as:  COUMADIN Take 3-6 mg by mouth as directed. 2 tabs by mouth on sat, sun,  1.5 tabs wed, all other days 1 tablet       Allergies:  Allergies  Allergen Reactions  . Quinine     Seizure     Past Medical History, Surgical history, Social history, and Family History were reviewed and updated.  Review of Systems: All other 10 point review of systems is negative.   Physical Exam:  weight is 260 lb (117.9 kg). His temperature is 97.9 F (36.6 C). His blood pressure is 99/55 (abnormal) and his pulse is 84. His respiration is 18 and oxygen saturation is 97%.   Wt Readings from Last 3 Encounters:    09/29/16 260 lb (117.9 kg)  09/15/16 275 lb 2.2 oz (124.8 kg)  07/20/16 288 lb (130.6 kg)    Ocular: Sclerae unicteric, pupils equal, round and reactive to light Ear-nose-throat: Oropharynx clear, dentition fair Lymphatic: No cervical supraclavicular or axillary adenopathy Lungs no rales or rhonchi, good excursion bilaterally Heart regular rate and rhythm, no murmur appreciated Abd soft, nontender, positive bowel sounds, no liver or spleen tip palpated on exam MSK no focal spinal tenderness, no joint edema Neuro: non-focal, well-oriented, appropriate affect Breasts: Deferred  Lab Results  Component Value Date   WBC 36.2 (H) 09/29/2016   HGB 16.3 09/29/2016   HCT 47.5 09/29/2016   MCV 89 09/29/2016   PLT 162 09/29/2016   Lab Results  Component Value Date   FERRITIN 157 06/22/2010   IRON 15 (L) 06/22/2010   TIBC 182 (L) 06/22/2010   UIBC 167 06/22/2010   IRONPCTSAT 8 (L) 06/22/2010   Lab Results  Component Value Date   RETICCTPCT 1.9 06/22/2010   RBC 5.35 09/29/2016   Lab Results  Component Value Date   KPAFRELGTCHN 10.70 (H) 11/17/2014   LAMBDASER 7.06 (H) 11/17/2014   KAPLAMBRATIO 1.08 04/20/2016   Lab Results  Component Value Date   IGGSERUM 1,840 (H) 04/20/2016   IGA 448 (H) 11/17/2014   IGMSERUM 62 04/20/2016   Lab Results  Component Value Date   TOTALPROTELP 7.8 04/23/2014   ALBUMINELP 45.6 (L) 04/23/2014   A1GS 5.7 (H) 04/23/2014   A2GS 10.1 04/23/2014   BETS 7.0 04/23/2014   BETA2SER 6.0 04/23/2014   GAMS 25.6 (H) 04/23/2014   MSPIKE Not Observed 10/21/2015   SPEI * 04/23/2014     Chemistry      Component Value Date/Time   NA 139 09/29/2016 1153   NA 142 04/20/2016 1143   K 3.6 09/29/2016 1153   K 4.4 04/20/2016 1143   CL 88 (L) 09/29/2016 1153   CO2 33 09/29/2016 1153   CO2 31 (H) 04/20/2016 1143   BUN 50 (H) 09/29/2016 1153   BUN 30.0 (H) 04/20/2016 1143   CREATININE 1.6 (H) 09/29/2016 1153   CREATININE 1.8 (H) 04/20/2016 1143       Component Value Date/Time   CALCIUM 9.6 09/29/2016 1153   CALCIUM 10.0 04/20/2016 1143   ALKPHOS 72 09/29/2016 1153   ALKPHOS 69 04/20/2016 1143   AST 40 (H) 09/29/2016 1153   AST 16 04/20/2016 1143   ALT 50 (H) 09/29/2016 1153   ALT 11 04/20/2016 1143   BILITOT 1.00 09/29/2016 1153   BILITOT 0.83 04/20/2016 1143     Impression and Plan: Mr. Gerena is 83 yo male with stage A  CLL documented by flow cytometry. He has multiple health issues including CHF.   His white cell count is up. However, this is mostly neutrophils. This might still be from his hospitalization for pneumonia.  Again, Mr. Bieker has a lot of non-oncologic issues. I think these will be much more important and will really determine his prognosis more so than CLL.  I do not see an indication for any treatment for his CLL. We do have to watch his IgG level. This is always been okay. 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 If so, and he has recurrent pneumonia, and he may need IVIG.  I talked to he and his wife about this. I spent about 25 minutes with them.  I will like to see him back in another month.    Volanda Napoleon, MD 1/12/20187:05 AM

## 2016-10-03 LAB — MULTIPLE MYELOMA PANEL, SERUM
ALBUMIN/GLOB SERPL: 0.9 (ref 0.7–1.7)
Albumin SerPl Elph-Mcnc: 3.1 g/dL (ref 2.9–4.4)
Alpha 1: 0.3 g/dL (ref 0.0–0.4)
Alpha2 Glob SerPl Elph-Mcnc: 0.9 g/dL (ref 0.4–1.0)
B-GLOBULIN SERPL ELPH-MCNC: 1 g/dL (ref 0.7–1.3)
GAMMA GLOB SERPL ELPH-MCNC: 1.4 g/dL (ref 0.4–1.8)
Globulin, Total: 3.7 g/dL (ref 2.2–3.9)
IgA, Qn, Serum: 332 mg/dL (ref 61–437)
IgM, Qn, Serum: 64 mg/dL (ref 15–143)
TOTAL PROTEIN: 6.8 g/dL (ref 6.0–8.5)

## 2016-10-05 ENCOUNTER — Inpatient Hospital Stay: Payer: PPO | Admitting: Acute Care

## 2016-10-07 ENCOUNTER — Inpatient Hospital Stay (HOSPITAL_COMMUNITY): Admission: RE | Admit: 2016-10-07 | Payer: PPO | Source: Ambulatory Visit

## 2016-10-10 DIAGNOSIS — I482 Chronic atrial fibrillation: Secondary | ICD-10-CM | POA: Diagnosis not present

## 2016-10-10 DIAGNOSIS — J189 Pneumonia, unspecified organism: Secondary | ICD-10-CM | POA: Diagnosis not present

## 2016-10-10 DIAGNOSIS — Z9981 Dependence on supplemental oxygen: Secondary | ICD-10-CM | POA: Diagnosis not present

## 2016-10-10 DIAGNOSIS — I5033 Acute on chronic diastolic (congestive) heart failure: Secondary | ICD-10-CM | POA: Diagnosis not present

## 2016-10-10 DIAGNOSIS — J9621 Acute and chronic respiratory failure with hypoxia: Secondary | ICD-10-CM | POA: Diagnosis not present

## 2016-10-10 DIAGNOSIS — J441 Chronic obstructive pulmonary disease with (acute) exacerbation: Secondary | ICD-10-CM | POA: Diagnosis not present

## 2016-10-10 DIAGNOSIS — M1712 Unilateral primary osteoarthritis, left knee: Secondary | ICD-10-CM | POA: Diagnosis not present

## 2016-10-10 DIAGNOSIS — N183 Chronic kidney disease, stage 3 (moderate): Secondary | ICD-10-CM | POA: Diagnosis not present

## 2016-10-10 DIAGNOSIS — Z7901 Long term (current) use of anticoagulants: Secondary | ICD-10-CM | POA: Diagnosis not present

## 2016-10-10 DIAGNOSIS — Z87891 Personal history of nicotine dependence: Secondary | ICD-10-CM | POA: Diagnosis not present

## 2016-10-10 DIAGNOSIS — Z794 Long term (current) use of insulin: Secondary | ICD-10-CM | POA: Diagnosis not present

## 2016-10-10 DIAGNOSIS — I130 Hypertensive heart and chronic kidney disease with heart failure and stage 1 through stage 4 chronic kidney disease, or unspecified chronic kidney disease: Secondary | ICD-10-CM | POA: Diagnosis not present

## 2016-10-10 DIAGNOSIS — J440 Chronic obstructive pulmonary disease with acute lower respiratory infection: Secondary | ICD-10-CM | POA: Diagnosis not present

## 2016-10-10 DIAGNOSIS — C9111 Chronic lymphocytic leukemia of B-cell type in remission: Secondary | ICD-10-CM | POA: Diagnosis not present

## 2016-10-11 DIAGNOSIS — I509 Heart failure, unspecified: Secondary | ICD-10-CM | POA: Diagnosis not present

## 2016-10-11 DIAGNOSIS — I482 Chronic atrial fibrillation: Secondary | ICD-10-CM | POA: Diagnosis not present

## 2016-10-11 DIAGNOSIS — N183 Chronic kidney disease, stage 3 (moderate): Secondary | ICD-10-CM | POA: Diagnosis not present

## 2016-10-11 DIAGNOSIS — Z9981 Dependence on supplemental oxygen: Secondary | ICD-10-CM | POA: Diagnosis not present

## 2016-10-11 DIAGNOSIS — J189 Pneumonia, unspecified organism: Secondary | ICD-10-CM | POA: Diagnosis not present

## 2016-10-11 DIAGNOSIS — C9111 Chronic lymphocytic leukemia of B-cell type in remission: Secondary | ICD-10-CM | POA: Diagnosis not present

## 2016-10-11 DIAGNOSIS — N390 Urinary tract infection, site not specified: Secondary | ICD-10-CM | POA: Diagnosis not present

## 2016-10-11 DIAGNOSIS — E662 Morbid (severe) obesity with alveolar hypoventilation: Secondary | ICD-10-CM | POA: Diagnosis not present

## 2016-10-11 DIAGNOSIS — J9621 Acute and chronic respiratory failure with hypoxia: Secondary | ICD-10-CM | POA: Diagnosis not present

## 2016-10-11 DIAGNOSIS — I130 Hypertensive heart and chronic kidney disease with heart failure and stage 1 through stage 4 chronic kidney disease, or unspecified chronic kidney disease: Secondary | ICD-10-CM | POA: Diagnosis not present

## 2016-10-11 DIAGNOSIS — I5033 Acute on chronic diastolic (congestive) heart failure: Secondary | ICD-10-CM | POA: Diagnosis not present

## 2016-10-11 DIAGNOSIS — Z794 Long term (current) use of insulin: Secondary | ICD-10-CM | POA: Diagnosis not present

## 2016-10-11 DIAGNOSIS — Z87891 Personal history of nicotine dependence: Secondary | ICD-10-CM | POA: Diagnosis not present

## 2016-10-11 DIAGNOSIS — R0602 Shortness of breath: Secondary | ICD-10-CM | POA: Diagnosis not present

## 2016-10-11 DIAGNOSIS — Z7901 Long term (current) use of anticoagulants: Secondary | ICD-10-CM | POA: Diagnosis not present

## 2016-10-11 DIAGNOSIS — M1712 Unilateral primary osteoarthritis, left knee: Secondary | ICD-10-CM | POA: Diagnosis not present

## 2016-10-11 DIAGNOSIS — J449 Chronic obstructive pulmonary disease, unspecified: Secondary | ICD-10-CM | POA: Diagnosis not present

## 2016-10-11 DIAGNOSIS — J441 Chronic obstructive pulmonary disease with (acute) exacerbation: Secondary | ICD-10-CM | POA: Diagnosis not present

## 2016-10-11 DIAGNOSIS — J440 Chronic obstructive pulmonary disease with acute lower respiratory infection: Secondary | ICD-10-CM | POA: Diagnosis not present

## 2016-10-11 DIAGNOSIS — J9610 Chronic respiratory failure, unspecified whether with hypoxia or hypercapnia: Secondary | ICD-10-CM | POA: Diagnosis not present

## 2016-10-11 DIAGNOSIS — J438 Other emphysema: Secondary | ICD-10-CM | POA: Diagnosis not present

## 2016-10-12 DIAGNOSIS — C9111 Chronic lymphocytic leukemia of B-cell type in remission: Secondary | ICD-10-CM | POA: Diagnosis not present

## 2016-10-12 DIAGNOSIS — Z87891 Personal history of nicotine dependence: Secondary | ICD-10-CM | POA: Diagnosis not present

## 2016-10-12 DIAGNOSIS — Z7901 Long term (current) use of anticoagulants: Secondary | ICD-10-CM | POA: Diagnosis not present

## 2016-10-12 DIAGNOSIS — J9621 Acute and chronic respiratory failure with hypoxia: Secondary | ICD-10-CM | POA: Diagnosis not present

## 2016-10-12 DIAGNOSIS — Z794 Long term (current) use of insulin: Secondary | ICD-10-CM | POA: Diagnosis not present

## 2016-10-12 DIAGNOSIS — Z9981 Dependence on supplemental oxygen: Secondary | ICD-10-CM | POA: Diagnosis not present

## 2016-10-12 DIAGNOSIS — J441 Chronic obstructive pulmonary disease with (acute) exacerbation: Secondary | ICD-10-CM | POA: Diagnosis not present

## 2016-10-12 DIAGNOSIS — I482 Chronic atrial fibrillation: Secondary | ICD-10-CM | POA: Diagnosis not present

## 2016-10-12 DIAGNOSIS — I130 Hypertensive heart and chronic kidney disease with heart failure and stage 1 through stage 4 chronic kidney disease, or unspecified chronic kidney disease: Secondary | ICD-10-CM | POA: Diagnosis not present

## 2016-10-12 DIAGNOSIS — J189 Pneumonia, unspecified organism: Secondary | ICD-10-CM | POA: Diagnosis not present

## 2016-10-12 DIAGNOSIS — I5033 Acute on chronic diastolic (congestive) heart failure: Secondary | ICD-10-CM | POA: Diagnosis not present

## 2016-10-12 DIAGNOSIS — M1712 Unilateral primary osteoarthritis, left knee: Secondary | ICD-10-CM | POA: Diagnosis not present

## 2016-10-12 DIAGNOSIS — J440 Chronic obstructive pulmonary disease with acute lower respiratory infection: Secondary | ICD-10-CM | POA: Diagnosis not present

## 2016-10-12 DIAGNOSIS — N183 Chronic kidney disease, stage 3 (moderate): Secondary | ICD-10-CM | POA: Diagnosis not present

## 2016-10-17 DIAGNOSIS — I130 Hypertensive heart and chronic kidney disease with heart failure and stage 1 through stage 4 chronic kidney disease, or unspecified chronic kidney disease: Secondary | ICD-10-CM | POA: Diagnosis not present

## 2016-10-17 DIAGNOSIS — J189 Pneumonia, unspecified organism: Secondary | ICD-10-CM | POA: Diagnosis not present

## 2016-10-17 DIAGNOSIS — J441 Chronic obstructive pulmonary disease with (acute) exacerbation: Secondary | ICD-10-CM | POA: Diagnosis not present

## 2016-10-17 DIAGNOSIS — M1712 Unilateral primary osteoarthritis, left knee: Secondary | ICD-10-CM | POA: Diagnosis not present

## 2016-10-17 DIAGNOSIS — Z87891 Personal history of nicotine dependence: Secondary | ICD-10-CM | POA: Diagnosis not present

## 2016-10-17 DIAGNOSIS — Z7901 Long term (current) use of anticoagulants: Secondary | ICD-10-CM | POA: Diagnosis not present

## 2016-10-17 DIAGNOSIS — J440 Chronic obstructive pulmonary disease with acute lower respiratory infection: Secondary | ICD-10-CM | POA: Diagnosis not present

## 2016-10-17 DIAGNOSIS — I5033 Acute on chronic diastolic (congestive) heart failure: Secondary | ICD-10-CM | POA: Diagnosis not present

## 2016-10-17 DIAGNOSIS — Z9981 Dependence on supplemental oxygen: Secondary | ICD-10-CM | POA: Diagnosis not present

## 2016-10-17 DIAGNOSIS — C9111 Chronic lymphocytic leukemia of B-cell type in remission: Secondary | ICD-10-CM | POA: Diagnosis not present

## 2016-10-17 DIAGNOSIS — I482 Chronic atrial fibrillation: Secondary | ICD-10-CM | POA: Diagnosis not present

## 2016-10-17 DIAGNOSIS — Z794 Long term (current) use of insulin: Secondary | ICD-10-CM | POA: Diagnosis not present

## 2016-10-17 DIAGNOSIS — J9621 Acute and chronic respiratory failure with hypoxia: Secondary | ICD-10-CM | POA: Diagnosis not present

## 2016-10-17 DIAGNOSIS — N183 Chronic kidney disease, stage 3 (moderate): Secondary | ICD-10-CM | POA: Diagnosis not present

## 2016-10-19 ENCOUNTER — Ambulatory Visit (INDEPENDENT_AMBULATORY_CARE_PROVIDER_SITE_OTHER): Payer: PPO | Admitting: Acute Care

## 2016-10-19 ENCOUNTER — Encounter: Payer: Self-pay | Admitting: Acute Care

## 2016-10-19 ENCOUNTER — Ambulatory Visit (INDEPENDENT_AMBULATORY_CARE_PROVIDER_SITE_OTHER)
Admission: RE | Admit: 2016-10-19 | Discharge: 2016-10-19 | Disposition: A | Discharge status: Home / Self Care | Payer: PPO | Source: Ambulatory Visit | Attending: Acute Care | Admitting: Acute Care

## 2016-10-19 VITALS — BP 90/60 | HR 96 | Ht 74.0 in | Wt 260.0 lb

## 2016-10-19 DIAGNOSIS — J9611 Chronic respiratory failure with hypoxia: Secondary | ICD-10-CM

## 2016-10-19 DIAGNOSIS — J441 Chronic obstructive pulmonary disease with (acute) exacerbation: Secondary | ICD-10-CM | POA: Diagnosis not present

## 2016-10-19 DIAGNOSIS — R0600 Dyspnea, unspecified: Secondary | ICD-10-CM

## 2016-10-19 DIAGNOSIS — R0602 Shortness of breath: Secondary | ICD-10-CM | POA: Diagnosis not present

## 2016-10-19 DIAGNOSIS — J9610 Chronic respiratory failure, unspecified whether with hypoxia or hypercapnia: Secondary | ICD-10-CM | POA: Diagnosis not present

## 2016-10-19 DIAGNOSIS — G4733 Obstructive sleep apnea (adult) (pediatric): Secondary | ICD-10-CM | POA: Diagnosis not present

## 2016-10-19 MED ORDER — PREDNISONE 10 MG PO TABS: ORAL_TABLET | ORAL | 0 refills | Status: DC

## 2016-10-19 NOTE — Assessment & Plan Note (Addendum)
Recent Multifactorial Exacerbation requiring 6 day hospital admission Non-compliant with maintenance medication schedule Continued Shortness of breath Saturation today on 3 L [place] 93% Plan We will check a CXR today. Prednisone taper; 10 mg tablets: 4 tabs x 2 days, 3 tabs x 2 days, 2 tabs x 2 days 1 tab x 2 days then stop. Watch blood sugars carefully while on prednisone Please take your Symbicort 2 puffs twice daily. Continue your Spiriva once daily. Increase your DuoNeb treatments to twice daily.( Can be used up to every 6 hours as needed) Use your Ventolin inhaler as needed for break through shortness of breath. Continue taking your fluid pill. Continue Weighing every day, and let your cardiologist know if your weight is up. Continue wearing your oxygen at 3 L [place]. Oxygen saturation goal of 90-95% Continue wearing BIPAP every night. Goal is 6 hours every night. Follow up appointment with [name] or NP in 1 month. Please contact office for sooner follow up if symptoms do not improve or worsen or seek emergency care

## 2016-10-19 NOTE — Assessment & Plan Note (Addendum)
Continue wearing oxygen at 3 L [place] Continue VPAP every night for at least 6 hours per night Follow up with [name] in 1 month Please contact office for sooner follow up if symptoms do not improve or worsen or seek emergency care

## 2016-10-19 NOTE — Progress Notes (Addendum)
History of Present Illness William Braun is a 83 y.o. male male former smoker with COPD, OSA/OHS, Rt hemidiaphragm paralysis, and chronic respiratory failure on home oxygen. He is followed by [name].Other significant medical  history includes CHF, HTN, DM II, and CLL ( [place]).  Synopsis:   24 Male: PMH of chronic hypoxic respiratory failure copd on home oxygen 3 L/m continuously, OSA Rt hemidiaphragm paralysis. on nightly BiPAP 14/10 cm H2O HTN, HLD,DM,CLL chronic r sided diastolic CHF, Chr. atrial fibrillation CHad2Vasc2 score= 5 on Coumadin,  Lexiscan myoview (11/12) with EF 65% Prior Stroke 2008 stage III chronic kidney disease,   10/19/2016 Hospital Follow up: Pt. Presents today for hospital follow up. He was admitted 09/14/2016-09/22/2016 with progressive dyspnea, hypoxia from pneumonia and acute on chronic diastolic CHF in setting of COPD, OSA/OHS, and Rt hemidiaphragm paralysis. He presented to ED 09/14/2017 with a 2 days history of worsening dyspnea, minimal dry cough but no chest pain, fever or chills. In the ED, pt. Was found to be hypoxic at 83% on 2 L/m oxygen. He was placed  briefly on BiPAP in the ED and then  Admitted to stepdown for   COPD exacerbation. The Patient had gradually improved until the afternoon of 12/31 when noted progressively worsening dyspnea. CXR showed worsening pulmonary edema and HAP.CCM was consulted. He was placed back on his home BIPAP ( Did not tolerate hospital machine), oxygen was weaned for saturations of 90 to 95%, brovana, pulmicort, and duonebs were given on a scheduled basis, BP was controlled with diuresis per the primary team, and antibiotic treatment was started with Vanc and Zosyn, with transition to Augmentin at D/C. The patient is here with his wife. He did complete his oral antibiotics. Approximately 1 week ago he was diagnosed with a UTI and was treated per his PCP with Cipro.He will complete treatment 10/20/2016. Since discharge the  patient states he has continued shortness of breath.Upon medication review, it became evident that he is not using his maintenance medications as ordered. He is using his Symbicort only once daily. He is using his DuoNebs only once daily, despite complaints of shortness of breath.He states he is compliant with his Spiriva once daily. He could not tell me how often he uses his rescue inhaler. We spend 10 minutes educating [name] and his wife on his home medication maintenance regimen. I explained that he needs to take his medications as prescribed for full benefit. He is compliant with his diuretics, and weight is stable.He denies any purulent secretions.He denies fever, chest pain, orthopnea or hemoptysis. He is sitting slumped forward in his wheel chair. We did discuss body position to better ventilate his lung bases. He is wearing his oxygen at 3 L [place] as prescribed.   Tests:  CXR 10/19/2016: FINDINGS: The lungs are well-aerated. There is elevation of the right hemidiaphragm. Bibasilar atelectasis is noted. There is no evidence of pleural effusion or pneumothorax.  The heart is borderline normal in size. No acute osseous abnormalities are seen.  IMPRESSION: Elevation of the right hemidiaphragm.  Bibasilar atelectasis noted.  STUDIES: Echo 1/01 >> mod LVH, EF 55 to 60%, mild AS, PAS 56 mmHg CT chest 1/01 >> b/l ASD Lt > Rt, mediastinal LAN  CULTURES: Pneumococcal Ag 12/27 >> negative Influenza PCR 12/28 >> negative Blood 1/01 >> ngtd>> negative  ANTIBIOTICS: Zosyn  Vancomycin Transitioned home on Augmentin PO   Pulmonary tests PFT 2011 >> FEV1 2.04 (66%), FEV1% 65 SNIFF 11/16/12 >> Rt hemidiaphragm paralysis  PFT 09/30/13 >> FEV1 1.78 (51%), FEV1% 73, TLC 4.67 (59%), DLCO 49%, no BD  Cardiac tests Echo 11/21/12 >>  Mod LVH, EF 60 to 65%, mod LA dilation, mod RV dilation, PAS 55 mmHg Echo 07/30/15 >> EF 60 to 65%, mild LVH, mild AS, PAS 71 mmHg  Sleep tests ONO on BiPAP  and 2 liters 11/19/12 >> test time 7 hrs 41 min.  Mean SpO2 93%, low SpO2 64%.  Spent 4 min 12 sec < 88% BiPAP 06/19/16 to 07/18/16 >> used on 30 of 30 nights with average 14 hrs 36 min.  Average AHI 0.6 with BiPAP 14/10 cm H2O  Past medical hx Past Medical History:  Diagnosis Date  . Atrial fibrillation ([place])   . Cerebrovascular disease   . CHF (congestive heart failure) ([place])   . CLL (chronic lymphocytic leukemia) ([place]) 11/17/2014  . COPD (chronic obstructive pulmonary disease) ([place])   . History of thrombocytopenia   . Obesity   . Osteoarthritis   . Other and unspecified hyperlipidemia   . Type II or unspecified type diabetes mellitus without mention of complication, not stated as uncontrolled   . Unspecified essential hypertension      Past surgical hx, Family hx, Social hx all reviewed.  Current Outpatient Prescriptions on File Prior to Visit  Medication Sig  . albuterol (VENTOLIN HFA) 108 (90 BASE) MCG/ACT inhaler Inhale 2 puffs into the lungs every 6 (six) hours as needed for wheezing or shortness of breath.  . budesonide-formoterol (SYMBICORT) 160-4.5 MCG/ACT inhaler Inhale 2 puffs into the lungs 2 (two) times daily. 2 puffs twice daily  . glimepiride (AMARYL) 2 MG tablet Take 4 mg by mouth daily.   Marland Kitchen HYDROcodone-acetaminophen (VICODIN) 5-500 MG per tablet Take 2 tablets by mouth every 6 (six) hours as needed for pain.   . Insulin Glargine (LANTUS) 100 UNIT/ML Solostar Pen Inject 20 Units into the skin daily at 10 pm.  . ipratropium-albuterol (DUONEB) 0.5-2.5 (3) MG/3ML SOLN Take 3 mLs by nebulization every 6 (six) hours as needed.  . metolazone (ZAROXOLYN) 2.5 MG tablet Metolazone 2.5 mg once every Monday Wednesday & Friday mornings  . metoprolol succinate (TOPROL-XL) 25 MG 24 hr tablet Take 1/2 tab by mouth two times a day  . nitroGLYCERIN (NITROSTAT) 0.4 MG SL tablet Place 1 tablet (0.4 mg total) under the tongue every 5 (five) minutes as needed for chest pain.  . OXYGEN  Inhale 3 L/min into the lungs.  . potassium chloride SA (K-DUR,KLOR-CON) 20 MEQ tablet Take 2 tablets (40 mEq total) by mouth 2 (two) times daily.  . silver sulfADIAZINE (SILVADENE) 1 % cream Apply 1 application topically daily.  . simvastatin (ZOCOR) 20 MG tablet Take 20 mg by mouth at bedtime.    Marland Kitchen tiotropium (SPIRIVA) 18 MCG inhalation capsule Place 1 capsule (18 mcg total) into inhaler and inhale daily.  Marland Kitchen torsemide (DEMADEX) 20 MG tablet Take 3 tablets (60 mg total) by mouth 2 (two) times daily.  . traZODone (DESYREL) 50 MG tablet Take 0.5 tablets (25 mg total) by mouth at bedtime as needed for sleep.  Marland Kitchen warfarin (COUMADIN) 3 MG tablet Take 3-6 mg by mouth as directed. 2 tabs by mouth on sat, sun,  1.5 tabs wed, all other days 1 tablet   No current facility-administered medications on file prior to visit.      Allergies  Allergen Reactions  . Quinine     Seizure     Review Of Systems:  Constitutional:   No  weight loss, night sweats,  Fevers, chills, +fatigue, or  lassitude.  HEENT:   No headaches,  Difficulty swallowing,  Tooth/dental problems, or  Sore throat,                No sneezing, itching, ear ache, nasal congestion, post nasal drip,   CV:  No chest pain,  Orthopnea, PND, swelling in lower extremities, anasarca, dizziness, palpitations, syncope.   GI  No heartburn, indigestion, abdominal pain, nausea, vomiting, diarrhea, change in bowel habits, loss of appetite, bloody stools.   Resp: + shortness of breath with exertion or at rest.  No excess mucus, no productive cough,  No non-productive cough,  No coughing up of blood.  No change in color of mucus.  + wheezing.  No chest wall deformity  Skin: no rash or lesions.  GU: no dysuria, change in color of urine, no urgency or frequency.  No flank pain, no hematuria   MS:  No joint pain or swelling.  No decreased range of motion.  No back pain.  Psych:  No change in mood or affect. No depression or anxiety.  No memory  loss.   Vital Signs BP 90/60 (BP Location: Left Arm, Cuff Size: Normal)   Pulse 96   Ht 6\' 2"  (1.88 m)   Wt 260 lb (117.9 kg)   SpO2 93%   BMI 33.38 kg/m   Body mass index is 33.38 kg/m.   Blood Pressure was reviewed. Per both the patient and his wife, patient's blood pressure baseline is in the 90-100/ 50-60 range. I did review previous OV and this does appear to be an accurate assessment.  Physical Exam:  General- No distress,  A&Ox3, obese deconditioned male in a wheel chair ENT: No sinus tenderness, TM clear, pale nasal mucosa, no oral exudate,no post nasal drip, no LAN Cardiac: S1, S2, regular rate and rhythm, no murmur Chest: + wheeze/ no rales/ dullness; no accessory muscle use, no nasal flaring, no sternal retractions, diminished per bases Abd.: Soft Non-tender, obese Ext: No clubbing cyanosis, trace edema bilateral ankles Neuro:  deconditioned at baseline Skin: No rashes, warm and dry Psych: Appears depressed   Assessment/Plan  COPD exacerbation (HCC) Recent Multifactorial Exacerbation requiring 6 day hospital admission Non-compliant with maintenance medication schedule Continued Shortness of breath Saturation today on 3 L [place] 93% Plan We will check a CXR today. Prednisone taper; 10 mg tablets: 4 tabs x 2 days, 3 tabs x 2 days, 2 tabs x 2 days 1 tab x 2 days then stop. Watch blood sugars carefully while on prednisone Please take your Symbicort 2 puffs twice daily. Continue your Spiriva once daily. Increase your DuoNeb treatments to twice daily.( Can be used up to every 6 hours as needed) Use your Ventolin inhaler as needed for break through shortness of breath. Continue taking your fluid pill. Continue Weighing every day, and let your cardiologist know if your weight is up. Continue wearing your oxygen at 3 L [place]. Oxygen saturation goal of 90-95% Continue wearing BIPAP every night. Goal is 6 hours every night. Follow up appointment with [name] or NP in 1  month. Please contact office for sooner follow up if symptoms do not improve or worsen or seek emergency care    Chronic respiratory failure Continue wearing oxygen at 3 L [place] Continue VPAP every night for at least 6 hours per night Follow up with [name] in 1 month Please contact office for sooner follow up if symptoms do not improve  or worsen or seek emergency care     Magdalen Spatz, NP 10/19/2016  8:59 PM

## 2016-10-19 NOTE — Patient Instructions (Addendum)
We will check a CXR today. Prednisone taper; 10 mg tablets: 4 tabs x 2 days, 3 tabs x 2 days, 2 tabs x 2 days 1 tab x 2 days then stop. Watch blood sugars carefully while on prednisone Please take your Symbicort 2 puffs twice daily. Continue your Spiriva once daily. Increase your DuoNeb treatments to twice daily.( Can be used up to every 6 hours as needed) Use your Ventolin inhaler as needed for break through shortness of breath. Continue taking your fluid pill. Continue Weighing every day, and let your cardiologist know if your weight is up. Continue wearing your oxygen at 3 L [place]. Oxygen saturation goal of 90-95% Continue wearing BIPAP every night. Goal is 6 hours every night. Follow up appointment with [name] or NP in 1 month. Please contact office for sooner follow up if symptoms do not improve or worsen or seek emergency care

## 2016-10-20 ENCOUNTER — Other Ambulatory Visit (HOSPITAL_COMMUNITY): Payer: Self-pay | Admitting: Cardiology

## 2016-10-20 NOTE — Progress Notes (Signed)
I have reviewed and agree with assessment/plan.  Chesley Mires, MD [facility] Pulmonary/Critical Care 10/20/2016, 12:55 PM Pager:  [phone]

## 2016-10-24 ENCOUNTER — Telehealth: Payer: Self-pay | Admitting: Pulmonary Disease

## 2016-10-24 DIAGNOSIS — M1712 Unilateral primary osteoarthritis, left knee: Secondary | ICD-10-CM | POA: Diagnosis not present

## 2016-10-24 DIAGNOSIS — J9621 Acute and chronic respiratory failure with hypoxia: Secondary | ICD-10-CM | POA: Diagnosis not present

## 2016-10-24 DIAGNOSIS — I130 Hypertensive heart and chronic kidney disease with heart failure and stage 1 through stage 4 chronic kidney disease, or unspecified chronic kidney disease: Secondary | ICD-10-CM | POA: Diagnosis not present

## 2016-10-24 DIAGNOSIS — I5033 Acute on chronic diastolic (congestive) heart failure: Secondary | ICD-10-CM | POA: Diagnosis not present

## 2016-10-24 DIAGNOSIS — Z87891 Personal history of nicotine dependence: Secondary | ICD-10-CM | POA: Diagnosis not present

## 2016-10-24 DIAGNOSIS — Z7901 Long term (current) use of anticoagulants: Secondary | ICD-10-CM | POA: Diagnosis not present

## 2016-10-24 DIAGNOSIS — Z794 Long term (current) use of insulin: Secondary | ICD-10-CM | POA: Diagnosis not present

## 2016-10-24 DIAGNOSIS — C9111 Chronic lymphocytic leukemia of B-cell type in remission: Secondary | ICD-10-CM | POA: Diagnosis not present

## 2016-10-24 DIAGNOSIS — J440 Chronic obstructive pulmonary disease with acute lower respiratory infection: Secondary | ICD-10-CM | POA: Diagnosis not present

## 2016-10-24 DIAGNOSIS — I482 Chronic atrial fibrillation: Secondary | ICD-10-CM | POA: Diagnosis not present

## 2016-10-24 DIAGNOSIS — J189 Pneumonia, unspecified organism: Secondary | ICD-10-CM | POA: Diagnosis not present

## 2016-10-24 DIAGNOSIS — Z9981 Dependence on supplemental oxygen: Secondary | ICD-10-CM | POA: Diagnosis not present

## 2016-10-24 DIAGNOSIS — J441 Chronic obstructive pulmonary disease with (acute) exacerbation: Secondary | ICD-10-CM | POA: Diagnosis not present

## 2016-10-24 DIAGNOSIS — N183 Chronic kidney disease, stage 3 (moderate): Secondary | ICD-10-CM | POA: Diagnosis not present

## 2016-10-24 NOTE — Telephone Encounter (Signed)
Notes Recorded by Magdalen Spatz, NP on 10/19/2016 at 4:56 PM EST Please let Mr. Pebley know his CXR was negative for pneumonia. Have him follow the plan of care we developed in the office today. Thanks so much.  Called and left detailed msg with results per spouses request

## 2016-10-29 ENCOUNTER — Encounter (HOSPITAL_COMMUNITY): Payer: Self-pay | Admitting: Emergency Medicine

## 2016-10-29 ENCOUNTER — Inpatient Hospital Stay (HOSPITAL_COMMUNITY)
Admission: EM | Admit: 2016-10-29 | Discharge: 2016-11-07 | DRG: 871 | Disposition: A | Discharge status: Skilled Nursing Facility | Payer: PPO | Attending: Internal Medicine | Admitting: Internal Medicine

## 2016-10-29 ENCOUNTER — Emergency Department (HOSPITAL_COMMUNITY): Payer: PPO

## 2016-10-29 DIAGNOSIS — J189 Pneumonia, unspecified organism: Secondary | ICD-10-CM | POA: Diagnosis present

## 2016-10-29 DIAGNOSIS — I130 Hypertensive heart and chronic kidney disease with heart failure and stage 1 through stage 4 chronic kidney disease, or unspecified chronic kidney disease: Secondary | ICD-10-CM | POA: Diagnosis not present

## 2016-10-29 DIAGNOSIS — A419 Sepsis, unspecified organism: Principal | ICD-10-CM | POA: Diagnosis present

## 2016-10-29 DIAGNOSIS — Z6833 Body mass index (BMI) 33.0-33.9, adult: Secondary | ICD-10-CM

## 2016-10-29 DIAGNOSIS — I272 Pulmonary hypertension, unspecified: Secondary | ICD-10-CM | POA: Diagnosis present

## 2016-10-29 DIAGNOSIS — Z87891 Personal history of nicotine dependence: Secondary | ICD-10-CM

## 2016-10-29 DIAGNOSIS — R6520 Severe sepsis without septic shock: Secondary | ICD-10-CM | POA: Diagnosis not present

## 2016-10-29 DIAGNOSIS — Z9981 Dependence on supplemental oxygen: Secondary | ICD-10-CM | POA: Diagnosis not present

## 2016-10-29 DIAGNOSIS — Z79899 Other long term (current) drug therapy: Secondary | ICD-10-CM

## 2016-10-29 DIAGNOSIS — I509 Heart failure, unspecified: Secondary | ICD-10-CM

## 2016-10-29 DIAGNOSIS — M6281 Muscle weakness (generalized): Secondary | ICD-10-CM | POA: Diagnosis not present

## 2016-10-29 DIAGNOSIS — S50812A Abrasion of left forearm, initial encounter: Secondary | ICD-10-CM | POA: Diagnosis present

## 2016-10-29 DIAGNOSIS — B370 Candidal stomatitis: Secondary | ICD-10-CM | POA: Diagnosis not present

## 2016-10-29 DIAGNOSIS — J449 Chronic obstructive pulmonary disease, unspecified: Secondary | ICD-10-CM | POA: Diagnosis not present

## 2016-10-29 DIAGNOSIS — I493 Ventricular premature depolarization: Secondary | ICD-10-CM | POA: Diagnosis present

## 2016-10-29 DIAGNOSIS — I5033 Acute on chronic diastolic (congestive) heart failure: Secondary | ICD-10-CM | POA: Diagnosis not present

## 2016-10-29 DIAGNOSIS — C9110 Chronic lymphocytic leukemia of B-cell type not having achieved remission: Secondary | ICD-10-CM | POA: Diagnosis present

## 2016-10-29 DIAGNOSIS — N183 Chronic kidney disease, stage 3 (moderate): Secondary | ICD-10-CM | POA: Diagnosis not present

## 2016-10-29 DIAGNOSIS — J440 Chronic obstructive pulmonary disease with acute lower respiratory infection: Secondary | ICD-10-CM | POA: Diagnosis not present

## 2016-10-29 DIAGNOSIS — R410 Disorientation, unspecified: Secondary | ICD-10-CM | POA: Diagnosis not present

## 2016-10-29 DIAGNOSIS — J441 Chronic obstructive pulmonary disease with (acute) exacerbation: Secondary | ICD-10-CM | POA: Diagnosis not present

## 2016-10-29 DIAGNOSIS — S61201D Unspecified open wound of left index finger without damage to nail, subsequent encounter: Secondary | ICD-10-CM | POA: Diagnosis not present

## 2016-10-29 DIAGNOSIS — E785 Hyperlipidemia, unspecified: Secondary | ICD-10-CM | POA: Diagnosis present

## 2016-10-29 DIAGNOSIS — E662 Morbid (severe) obesity with alveolar hypoventilation: Secondary | ICD-10-CM | POA: Diagnosis present

## 2016-10-29 DIAGNOSIS — J9621 Acute and chronic respiratory failure with hypoxia: Secondary | ICD-10-CM | POA: Diagnosis not present

## 2016-10-29 DIAGNOSIS — C9510 Chronic leukemia of unspecified cell type not having achieved remission: Secondary | ICD-10-CM | POA: Diagnosis not present

## 2016-10-29 DIAGNOSIS — S70212D Abrasion, left hip, subsequent encounter: Secondary | ICD-10-CM | POA: Diagnosis not present

## 2016-10-29 DIAGNOSIS — E874 Mixed disorder of acid-base balance: Secondary | ICD-10-CM | POA: Diagnosis not present

## 2016-10-29 DIAGNOSIS — R0602 Shortness of breath: Secondary | ICD-10-CM

## 2016-10-29 DIAGNOSIS — N184 Chronic kidney disease, stage 4 (severe): Secondary | ICD-10-CM | POA: Diagnosis not present

## 2016-10-29 DIAGNOSIS — R488 Other symbolic dysfunctions: Secondary | ICD-10-CM | POA: Diagnosis not present

## 2016-10-29 DIAGNOSIS — N179 Acute kidney failure, unspecified: Secondary | ICD-10-CM | POA: Diagnosis not present

## 2016-10-29 DIAGNOSIS — E119 Type 2 diabetes mellitus without complications: Secondary | ICD-10-CM | POA: Diagnosis not present

## 2016-10-29 DIAGNOSIS — E1122 Type 2 diabetes mellitus with diabetic chronic kidney disease: Secondary | ICD-10-CM | POA: Diagnosis present

## 2016-10-29 DIAGNOSIS — R0682 Tachypnea, not elsewhere classified: Secondary | ICD-10-CM | POA: Diagnosis not present

## 2016-10-29 DIAGNOSIS — G4733 Obstructive sleep apnea (adult) (pediatric): Secondary | ICD-10-CM | POA: Diagnosis not present

## 2016-10-29 DIAGNOSIS — E876 Hypokalemia: Secondary | ICD-10-CM | POA: Diagnosis present

## 2016-10-29 DIAGNOSIS — I959 Hypotension, unspecified: Secondary | ICD-10-CM | POA: Diagnosis present

## 2016-10-29 DIAGNOSIS — J9610 Chronic respiratory failure, unspecified whether with hypoxia or hypercapnia: Secondary | ICD-10-CM | POA: Diagnosis present

## 2016-10-29 DIAGNOSIS — I4891 Unspecified atrial fibrillation: Secondary | ICD-10-CM | POA: Diagnosis present

## 2016-10-29 DIAGNOSIS — I10 Essential (primary) hypertension: Secondary | ICD-10-CM | POA: Diagnosis present

## 2016-10-29 DIAGNOSIS — M1990 Unspecified osteoarthritis, unspecified site: Secondary | ICD-10-CM | POA: Diagnosis present

## 2016-10-29 DIAGNOSIS — S30810A Abrasion of lower back and pelvis, initial encounter: Secondary | ICD-10-CM | POA: Diagnosis present

## 2016-10-29 DIAGNOSIS — Z7901 Long term (current) use of anticoagulants: Secondary | ICD-10-CM

## 2016-10-29 DIAGNOSIS — S61209A Unspecified open wound of unspecified finger without damage to nail, initial encounter: Secondary | ICD-10-CM | POA: Diagnosis present

## 2016-10-29 DIAGNOSIS — D72820 Lymphocytosis (symptomatic): Secondary | ICD-10-CM | POA: Diagnosis not present

## 2016-10-29 DIAGNOSIS — N19 Unspecified kidney failure: Secondary | ICD-10-CM

## 2016-10-29 DIAGNOSIS — E860 Dehydration: Secondary | ICD-10-CM | POA: Diagnosis present

## 2016-10-29 DIAGNOSIS — I482 Chronic atrial fibrillation: Secondary | ICD-10-CM | POA: Diagnosis not present

## 2016-10-29 DIAGNOSIS — R319 Hematuria, unspecified: Secondary | ICD-10-CM | POA: Diagnosis not present

## 2016-10-29 DIAGNOSIS — X58XXXA Exposure to other specified factors, initial encounter: Secondary | ICD-10-CM | POA: Diagnosis not present

## 2016-10-29 DIAGNOSIS — N390 Urinary tract infection, site not specified: Secondary | ICD-10-CM | POA: Diagnosis present

## 2016-10-29 DIAGNOSIS — C9111 Chronic lymphocytic leukemia of B-cell type in remission: Secondary | ICD-10-CM | POA: Diagnosis not present

## 2016-10-29 DIAGNOSIS — Z794 Long term (current) use of insulin: Secondary | ICD-10-CM | POA: Diagnosis not present

## 2016-10-29 DIAGNOSIS — G9340 Encephalopathy, unspecified: Secondary | ICD-10-CM | POA: Diagnosis present

## 2016-10-29 DIAGNOSIS — M1712 Unilateral primary osteoarthritis, left knee: Secondary | ICD-10-CM | POA: Diagnosis not present

## 2016-10-29 LAB — URINALYSIS, ROUTINE W REFLEX MICROSCOPIC
Bilirubin Urine: NEGATIVE
GLUCOSE, UA: NEGATIVE mg/dL
Ketones, ur: NEGATIVE mg/dL
NITRITE: POSITIVE — AB
Protein, ur: 100 mg/dL — AB
SPECIFIC GRAVITY, URINE: 1.009 (ref 1.005–1.030)
Squamous Epithelial / LPF: NONE SEEN
pH: 6 (ref 5.0–8.0)

## 2016-10-29 LAB — CBC WITH DIFFERENTIAL/PLATELET
Basophils Absolute: 0 10*3/uL (ref 0.0–0.1)
Basophils Relative: 0 %
Eosinophils Absolute: 0 10*3/uL (ref 0.0–0.7)
Eosinophils Relative: 0 %
HCT: 47.2 % (ref 39.0–52.0)
Hemoglobin: 16.6 g/dL (ref 13.0–17.0)
LYMPHS PCT: 53 %
Lymphs Abs: 20.5 10*3/uL — ABNORMAL HIGH (ref 0.7–4.0)
MCH: 31.5 pg (ref 26.0–34.0)
MCHC: 35.2 g/dL (ref 30.0–36.0)
MCV: 89.6 fL (ref 78.0–100.0)
MONO ABS: 1.5 10*3/uL — AB (ref 0.1–1.0)
MONOS PCT: 4 %
NEUTROS PCT: 43 %
Neutro Abs: 16.6 10*3/uL — ABNORMAL HIGH (ref 1.7–7.7)
PLATELETS: 130 10*3/uL — AB (ref 150–400)
RBC: 5.27 MIL/uL (ref 4.22–5.81)
RDW: 18 % — ABNORMAL HIGH (ref 11.5–15.5)
WBC: 38.6 10*3/uL — AB (ref 4.0–10.5)

## 2016-10-29 LAB — PROTIME-INR
INR: 1.57
PROTHROMBIN TIME: 18.9 s — AB (ref 11.4–15.2)

## 2016-10-29 LAB — COMPREHENSIVE METABOLIC PANEL
ALBUMIN: 3.2 g/dL — AB (ref 3.5–5.0)
ALT: 18 U/L (ref 17–63)
ANION GAP: 18 — AB (ref 5–15)
AST: 32 U/L (ref 15–41)
Alkaline Phosphatase: 79 U/L (ref 38–126)
BUN: 106 mg/dL — ABNORMAL HIGH (ref 6–20)
CHLORIDE: 76 mmol/L — AB (ref 101–111)
CO2: 40 mmol/L — AB (ref 22–32)
Calcium: 9.7 mg/dL (ref 8.9–10.3)
Creatinine, Ser: 2.23 mg/dL — ABNORMAL HIGH (ref 0.61–1.24)
GFR calc Af Amer: 30 mL/min — ABNORMAL LOW (ref >60)
GFR calc non Af Amer: 26 mL/min — ABNORMAL LOW (ref >60)
GLUCOSE: 188 mg/dL — AB (ref 65–99)
POTASSIUM: 2.8 mmol/L — AB (ref 3.5–5.1)
SODIUM: 134 mmol/L — AB (ref 135–145)
Total Bilirubin: 1.6 mg/dL — ABNORMAL HIGH (ref 0.3–1.2)
Total Protein: 7.6 g/dL (ref 6.5–8.1)

## 2016-10-29 LAB — I-STAT CG4 LACTIC ACID, ED
Lactic Acid, Venous: 2.79 mmol/L (ref 0.5–1.9)
Lactic Acid, Venous: 3.61 mmol/L (ref 0.5–1.9)

## 2016-10-29 MED ORDER — DEXTROSE 5 % IV SOLN
1.0000 g | Freq: Once | INTRAVENOUS | Status: AC
  Administered 2016-10-29: 1 g via INTRAVENOUS
  Filled 2016-10-29: qty 10

## 2016-10-29 MED ORDER — SODIUM CHLORIDE 0.9 % IV BOLUS (SEPSIS)
1000.0000 mL | Freq: Once | INTRAVENOUS | Status: AC
  Administered 2016-10-29: 1000 mL via INTRAVENOUS

## 2016-10-29 MED ORDER — POTASSIUM CHLORIDE CRYS ER 20 MEQ PO TBCR
40.0000 meq | EXTENDED_RELEASE_TABLET | Freq: Once | ORAL | Status: AC
  Administered 2016-10-29: 40 meq via ORAL
  Filled 2016-10-29: qty 2

## 2016-10-29 MED ORDER — SODIUM CHLORIDE 0.9 % IV SOLN
Freq: Once | INTRAVENOUS | Status: AC
  Administered 2016-10-29: 23:00:00 via INTRAVENOUS
  Filled 2016-10-29: qty 1000

## 2016-10-29 NOTE — H&P (Signed)
History and Physical   TASHEEM CONDRON [id number] DOB: Sep 18, 1933 DOA: 10/29/2016  PCP: Leeroy Cha, MD  Chief Complaint: SOB  HPI:  Mr. Capobianco is a 83 year old Caucasian male with past medical history significant for right-sided congestive heart failure, essential hypertension, hyperlipidemia, diabetes mellitus type 2, atrial fibrillation on warfarin therapy. Patient presents to the emergency department on 2/10 complaining of confusion. He is accompanied by his wife and son, who also provided substantial part of the history. Of note, patient was recently hospitalized and treated for acute hypoxic respiratory failure, acute exacerbation of diastolic heart failure, and healthcare acquired pneumonia. He reports patient made a recovery from the pneumonia and recently completed the prednisone. Over the past 3 days, family notes dark brown urine and poor oral intake. Patient denies having any appetite. No fever chills at home. Patient denies any respiratory symptoms such as cough, fever, shortness of breath. No recent change in medications. Patient is otherwise been in his normal state of health.  ED Course:  On arrival to the ER, patient had temperature of 100.2, heart rate in the low 100s, respiratory rate in the 20s, and variable blood pressure readings (most with MAP around 65). Patient was saturating 93% on nasal cannula. Patient was given 2 L bolus normal saline and ceftriaxone.  Review of Systems: A complete ROS was obtained; pertinent positives negatives are denoted in the HPI. Otherwise, all systems are negative.   Past Medical History:  Diagnosis Date  . Atrial fibrillation ([place])   . Cerebrovascular disease   . CHF (congestive heart failure) ([place])   . CLL (chronic lymphocytic leukemia) ([place]) 11/17/2014  . COPD (chronic obstructive pulmonary disease) ([place])   . History of thrombocytopenia   . Obesity   . Osteoarthritis   . Other and unspecified hyperlipidemia   . Type II or  unspecified type diabetes mellitus without mention of complication, not stated as uncontrolled   . Unspecified essential hypertension    Social History   Social History  . Marital status: Married    Spouse name: N/A  . Number of children: N/A  . Years of education: N/A   Occupational History  . Retired    Social History Main Topics  . Smoking status: Former Smoker    Packs/day: 2.00    Years: 34.00    Types: Cigarettes    Start date: 04/23/1946    Quit date: 09/19/1978  . Smokeless tobacco: Never Used     Comment: quit 35 years ago  . Alcohol use No  . Drug use: Unknown  . Sexual activity: Not on file   Other Topics Concern  . Not on file   Social History Narrative  . No narrative on file   Family History  Problem Relation Age of Onset  . Heart disease Father     Physical Exam: Vitals:   10/29/16 2007 10/29/16 2026 10/29/16 2131 10/29/16 2220  BP:   109/61 100/78  Pulse:   103 97  Resp:   (!) 27 (!) 30  Temp:  100.2 F (37.9 C)    TempSrc:  Rectal    SpO2: 95%   91%   General: Appears confused. ENT: Grossly normal hearing, MMM. Cardiovascular: Tachycardic. Irregularly irregular rhythm. Respiratory: Slight increased work of breathing. Decreased breath sounds diffusely. Abdomen: Soft, non-tender. Bowel sounds present.  Skin: No rash or induration seen on limited exam. Musculoskeletal: Grossly normal tone BUE/BLE. Appropriate ROM.  Psychiatric: Grossly normal mood and affect. Neurologic: Confused. Does not know the year. Knows  name and location. Distracted easily.  I have personally reviewed the following labs, culture data, and imaging studies.  Labs:  CBC reveals white blood cell count 38.6, hemoglobin 16.6, platelet count 130. [place] of metabolic panel shows sodium 134, potassium 2.8, BUN 106, creatinine 2.2. Lactic acid 2.79.  Cultures:  Blood cx obtained in ED.  Radiology:  No obvious sign of pneumonia.  Assessment/Plan: Mr. Flaugher is a  83 year old Caucasian male with past medical history significant for right-sided congestive heart failure, essential hypertension, hyperlipidemia, diabetes mellitus type 2, atrial fibrillation on warfarin therapy. Patient presents to the emergency department on 2/10 complaining of confusion.  #1 Acute encephalopathy Patient presented with confusion, which family states is different than his baseline mental status. Certainly the differential diagnosis is quite broad in an elderly gentleman with multiple comorbid conditions. The elevated BUN and other metabolic disturbances such as hypokalemia are likely contributing. Patient's urinalysis also raises the suspicion for urinary tract infection, which I think is most responsible for the patient's presentation. I doubt underlying pneumonia. CVA is possible, though neuro exam is nonfocal and patient takes warfarin for atrial fibrillation. I highly doubt underlying bacterial meningitis. Plan is to empirically treat for complicated urinary tract infection. Patient has received IV fluids for suspected dehydration. Will recheck basic metabolic panel to check for metabolic disturbances. No acute need for hemodialysis at this time. Will order urine culture to tailor antibiotic choice. If no improvement in mental status, patient will need CT head.  #2 Atrial fibrillation Patient has a long-standing history of atrial fibrillation with elevated chads 2 Vascor. He takes warfarin at home. Heart rates are in the 90-100 range in the emergency department. Underlying infection may be worsening tachycardia. Will continue home beta blocker and warfarin.  #3 Diabetes mellitus type 2 Patient takes Lantus at home. Will continue Lantus and add sliding scale insulin for glycemic control.  #4 Diastolic congestive heart failure Patient seems to have more right-sided disease. Clinical presentation and history suggest dehydration given extremely poor oral intake over the preceding 3  days. Given aggressive volume resuscitation in the emergency department, patient may benefit from Lasix at a future date. Will continue to monitor volume status. Will hold home diuretics for the time being.   DVT prophylaxis: Subq Lovenox Code Status: Full Code Disposition Plan: Anticipate D/C home in 2-5 days Consults called: ER physician called nephrology consultation; planning to see patient in the morning. Admission status: Inpatient admission. Given tachycardia and lower normal blood pressure, will admit to stepdown unit.   Clydene Laming, MD [facility] [phone]  If 7PM-7AM, please contact night-coverage [email or web address] Password [email or web address]

## 2016-10-29 NOTE — ED Notes (Signed)
X-ray at bedside

## 2016-10-29 NOTE — ED Notes (Signed)
Nurse will draw labs. 

## 2016-10-29 NOTE — ED Provider Notes (Signed)
[facility] DEPT Provider Note   CSN: [id number] Arrival date & time: 10/29/16  1944     History   Chief Complaint Chief Complaint  Patient presents with  . Shortness of Breath    HPI OSEI GORSKY is a 83 y.o. male.  The history is provided by the spouse and a relative.  Altered Mental Status   This is a new problem. The current episode started 2 days ago. The problem has been gradually worsening. Associated symptoms include confusion, somnolence and weakness. Risk factors include a recent illness. His past medical history is significant for COPD. Past medical history comments: CKD.    Past Medical History:  Diagnosis Date  . Atrial fibrillation ([place])   . Cerebrovascular disease   . CHF (congestive heart failure) ([place])   . CLL (chronic lymphocytic leukemia) ([place]) 11/17/2014  . COPD (chronic obstructive pulmonary disease) ([place])   . History of thrombocytopenia   . Obesity   . Osteoarthritis   . Other and unspecified hyperlipidemia   . Type II or unspecified type diabetes mellitus without mention of complication, not stated as uncontrolled   . Unspecified essential hypertension     Patient Active Problem List   Diagnosis Date Noted  . Acute on chronic respiratory failure with hypoxia ([place]) 09/14/2016  . Elevated troponin 09/14/2016  . Hypokalemia 09/14/2016  . Acute renal failure superimposed on stage 3 chronic kidney disease ([place]) 09/14/2016  . Toe ulcer, right ([place]) 02/02/2016  . Type II diabetes mellitus with neurological manifestations ([place]) 02/02/2016  . CLL (chronic lymphocytic leukemia) ([place]) 11/17/2014  . Elevated WBCs 04/23/2014  . Other emphysema ([place]) 09/27/2013  . Dyspnea 08/13/2013  . 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 Chest pain, unspecified 08/03/2011  . Chronic diastolic CHF (congestive heart failure) ([place]) 05/11/2011  . Obesity hypoventilation syndrome ([place]) 03/12/2010  . CONGESTIVE HEART FAILURE, BIVENTRICULAR DYSFUNCTION 03/12/2010  . COPD  exacerbation ([place]) 03/12/2010  . Chronic respiratory failure ([place]) 02/19/2010  . HLD (hyperlipidemia) 12/03/2008  . Morbid obesity ([place]) 12/03/2008  . Essential hypertension 12/03/2008  . ATRIAL FIBRILLATION 12/03/2008  . PERIPHERAL EDEMA 12/03/2008    Past Surgical History:  Procedure Laterality Date  . KNEE ARTHROSCOPY W/ ALLOGRAFT IMPANT    . VASECTOMY         Home Medications    Prior to Admission medications   Medication Sig Start Date End Date Taking? Authorizing Provider  albuterol (VENTOLIN HFA) 108 (90 BASE) MCG/ACT inhaler Inhale 2 puffs into the lungs every 6 (six) hours as needed for wheezing or shortness of breath. 07/23/15  Yes Chesley Mires, MD  budesonide-formoterol (SYMBICORT) 160-4.5 MCG/ACT inhaler Inhale 2 puffs into the lungs 2 (two) times daily. 2 puffs twice daily 11/13/15  Yes Chesley Mires, MD  ciprofloxacin (CIPRO) 250 MG tablet Take 250 mg by mouth 2 (two) times daily.   Yes Historical Provider, MD  glimepiride (AMARYL) 4 MG tablet Take 8 mg by mouth daily with breakfast.   Yes Historical Provider, MD  HYDROcodone-acetaminophen (NORCO/VICODIN) 5-325 MG tablet Take 1 tablet by mouth every 6 (six) hours as needed for moderate pain.   Yes Historical Provider, MD  Insulin Glargine (LANTUS) 100 UNIT/ML Solostar Pen Inject 20 Units into the skin daily at 10 pm. 09/22/16  Yes Nita Sells, MD  ipratropium-albuterol (DUONEB) 0.5-2.5 (3) MG/3ML SOLN Take 3 mLs by nebulization every 6 (six) hours as needed. Patient taking differently: Take 3 mLs by nebulization 2 (two) times daily.  03/31/16  Yes Magdalen Spatz, NP  metolazone (ZAROXOLYN) 2.5 MG tablet TAKE ONE TABLET BY MOUTH EVERY MONDAY, [facility] AND FRIDAY MORNINGS 10/20/16  Yes Larey Dresser, MD  metoprolol tartrate (LOPRESSOR) 25 MG tablet Take 12.5 mg by mouth 2 (two) times daily.   Yes Historical Provider, MD  nitroGLYCERIN (NITROSTAT) 0.4 MG SL tablet Place 1 tablet (0.4 mg total) under the tongue every 5  (five) minutes as needed for chest pain. 08/03/11  Yes Scott Joylene Draft, PA-C  OXYGEN Inhale 3 L/min into the lungs continuous.    Yes Historical Provider, MD  potassium chloride (K-DUR,KLOR-CON) 10 MEQ tablet Take 40 mEq by mouth 2 (two) times daily.   Yes Historical Provider, MD  simvastatin (ZOCOR) 20 MG tablet Take 20 mg by mouth daily.    Yes Historical Provider, MD  tiotropium (SPIRIVA) 18 MCG inhalation capsule Place 1 capsule (18 mcg total) into inhaler and inhale daily. 11/13/15  Yes Chesley Mires, MD  torsemide (DEMADEX) 20 MG tablet Take 80 mg by mouth 2 (two) times daily.   Yes Historical Provider, MD  warfarin (COUMADIN) 2.5 MG tablet Take 2.5 mg by mouth daily at 6 PM.   Yes Historical Provider, MD  predniSONE (DELTASONE) 10 MG tablet Take 4 tabs po x 2 days, then 3 x 2 days, then 2 x 2 days, then 1 x 2 days then stop. Patient not taking: Reported on 10/29/2016 10/19/16   Magdalen Spatz, NP    Family History Family History  Problem Relation Age of Onset  . Heart disease Father     Social History Social History  Substance Use Topics  . Smoking status: Former Smoker    Packs/day: 2.00    Years: 34.00    Types: Cigarettes    Start date: 04/23/1946    Quit date: 09/19/1978  . Smokeless tobacco: Never Used     Comment: quit 35 years ago  . Alcohol use No     Allergies   Quinine   Review of Systems Review of Systems  Neurological: Positive for weakness.  Psychiatric/Behavioral: Positive for confusion.  All other systems reviewed and are negative.    Physical Exam Updated Vital Signs BP 109/61   Pulse 103   Temp 100.2 F (37.9 C) (Rectal)   Resp (!) 27   SpO2 95%   Physical Exam  Constitutional: He appears well-developed and well-nourished. No distress.  HENT:  Head: Normocephalic and atraumatic.  Nose: Nose normal.  Eyes: Conjunctivae are normal.  Neck: Neck supple. No tracheal deviation present.  Cardiovascular: An irregularly irregular rhythm present.  Tachycardia present.   Pulmonary/Chest: Effort normal and breath sounds normal. Tachypnea noted. No respiratory distress.  Abdominal: Soft. He exhibits no distension.  Neurological: He is alert. He is disoriented (oriented to self and location with intermittently worsening confusion).  Skin: Skin is warm and dry.  Psychiatric: He has a normal mood and affect.     ED Treatments / Results  Labs (all labs ordered are listed, but only abnormal results are displayed) Labs Reviewed  COMPREHENSIVE METABOLIC PANEL - Abnormal; Notable for the following:       Result Value   Sodium 134 (*)    Potassium 2.8 (*)    Chloride 76 (*)    CO2 40 (*)    Glucose, Bld 188 (*)    BUN 106 (*)    Creatinine, Ser 2.23 (*)    Albumin 3.2 (*)    Total Bilirubin 1.6 (*)    GFR calc non Af Amer 26 (*)  GFR calc Af Amer 30 (*)    Anion gap 18 (*)    All other components within normal limits  CBC WITH DIFFERENTIAL/PLATELET - Abnormal; Notable for the following:    WBC 38.6 (*)    RDW 18.0 (*)    Platelets 130 (*)    Neutro Abs 16.6 (*)    Lymphs Abs 20.5 (*)    Monocytes Absolute 1.5 (*)    All other components within normal limits  PROTIME-INR - Abnormal; Notable for the following:    Prothrombin Time 18.9 (*)    All other components within normal limits  I-STAT CG4 LACTIC ACID, ED - Abnormal; Notable for the following:    Lactic Acid, Venous 2.79 (*)    All other components within normal limits  CULTURE, BLOOD (ROUTINE X 2)  CULTURE, BLOOD (ROUTINE X 2)  URINALYSIS, ROUTINE W REFLEX MICROSCOPIC    EKG  EKG Interpretation  Date/Time:  Saturday October 29 2016 19:53:46 EST Ventricular Rate:  95 PR Interval:    QRS Duration: 154 QT Interval:  420 QTC Calculation: 528 R Axis:   -34 Text Interpretation:  Atrial fibrillation Right bundle branch block Baseline wander in lead(s) V5 No significant change since last tracing Confirmed by Annajulia Lewing MD, Yassmine Tamm [id number]) on 10/29/2016 8:00:19 PM        Radiology Dg Chest Portable 1 View  Result Date: 10/29/2016 CLINICAL DATA:  Acute onset of shortness of breath. Altered mental status. Initial encounter. EXAM: PORTABLE CHEST 1 VIEW COMPARISON:  Chest radiograph performed 10/19/2016 FINDINGS: There is elevation of the right hemidiaphragm. There is no evidence of effusion or pneumothorax. The cardiomediastinal silhouette is borderline normal in size. No acute osseous abnormalities are seen. IMPRESSION: Elevation of the right hemidiaphragm.  Lungs remain grossly clear. Electronically Signed   By: Garald Balding M.D.   On: 10/29/2016 20:47    Procedures Procedures (including critical care time)  Medications Ordered in ED Medications  sodium chloride 0.9 % 1,000 mL with potassium chloride 80 mEq infusion ( Intravenous Given 10/29/16 2326)  sodium chloride 0.9 % bolus 1,000 mL (0 mLs Intravenous Stopped 10/29/16 2326)  cefTRIAXone (ROCEPHIN) 1 g in dextrose 5 % 50 mL IVPB (0 g Intravenous Stopped 10/29/16 2227)  potassium chloride SA (K-DUR,KLOR-CON) CR tablet 40 mEq (40 mEq Oral Given 10/29/16 2320)  sodium chloride 0.9 % bolus 1,000 mL (0 mLs Intravenous Stopped 10/30/16 0020)     Initial Impression / Assessment and Plan / ED Course  I have reviewed the triage vital signs and the nursing notes.  Pertinent labs & imaging results that were available during my care of the patient were reviewed by me and considered in my medical decision making (see chart for details).     83 y.o. male presents with increasing confusion over last 2 days. Symptoms first noted around 1 week ago. Pt disoriented but no loss of consciousness. Similar symptoms during recent admission and has had UTI previously with similar presentation. Here has significant  uremia and AKI likely contributing to encephalopathy secondary to sepsis from UTI. Does not have lactate >4 or prolonged hypotension so no signs of septic shock currently. Hypokalemia corrected via IV and po  repletion. Hospitalist was consulted for admission and will see the patient in the emergency department.   Final Clinical Impressions(s) / ED Diagnoses   Final diagnoses:  Acute encephalopathy  Sepsis, due to unspecified organism ([place])  AKI (acute kidney injury) ([place])  Uremia  Acute hypokalemia  Urinary tract infection with  hematuria, site unspecified    New Prescriptions New Prescriptions   No medications on file     Leo Grosser, MD 10/30/16 [phone]

## 2016-10-29 NOTE — ED Notes (Signed)
ED Provider at bedside. 

## 2016-10-29 NOTE — ED Triage Notes (Signed)
Per EMS, pt from home and was having SOB. Upon EMS arrival, pt was in tripod position with diminished lung sounds and 80% RA. Pt placed on nonrebreather and went into the 90s. EMS gave 125 solumedrol, 10 Albuterol, 0.5 Atrovent and 229ml fluid. Pt has hx of end stage COPD and Afib. Pt on 3L at home. Pt's family mentioned pt has had AMS frequently. Pt has had blood in urine, dark in color and strong odor. Pt had a fall this morning out of chair and denied EMS transport. Pt is total care and has home health care. Pt reporting 10/10 back pain, bottom is red and small laceration to left buttock. EMS VS BP 110/60, 90s Hr. Pt A&O x3 at this time.

## 2016-10-30 DIAGNOSIS — I5033 Acute on chronic diastolic (congestive) heart failure: Secondary | ICD-10-CM

## 2016-10-30 DIAGNOSIS — S50812A Abrasion of left forearm, initial encounter: Secondary | ICD-10-CM | POA: Diagnosis present

## 2016-10-30 LAB — COMPREHENSIVE METABOLIC PANEL
ALT: 18 U/L (ref 17–63)
AST: 31 U/L (ref 15–41)
Albumin: 2.5 g/dL — ABNORMAL LOW (ref 3.5–5.0)
Alkaline Phosphatase: 61 U/L (ref 38–126)
Anion gap: 18 — ABNORMAL HIGH (ref 5–15)
BILIRUBIN TOTAL: 1.3 mg/dL — AB (ref 0.3–1.2)
BUN: 105 mg/dL — AB (ref 6–20)
CO2: 33 mmol/L — ABNORMAL HIGH (ref 22–32)
Calcium: 8.5 mg/dL — ABNORMAL LOW (ref 8.9–10.3)
Chloride: 83 mmol/L — ABNORMAL LOW (ref 101–111)
Creatinine, Ser: 2.21 mg/dL — ABNORMAL HIGH (ref 0.61–1.24)
GFR calc Af Amer: 30 mL/min — ABNORMAL LOW (ref >60)
GFR, EST NON AFRICAN AMERICAN: 26 mL/min — AB (ref >60)
Glucose, Bld: 376 mg/dL — ABNORMAL HIGH (ref 65–99)
Potassium: 3.8 mmol/L (ref 3.5–5.1)
Sodium: 134 mmol/L — ABNORMAL LOW (ref 135–145)
TOTAL PROTEIN: 6.2 g/dL — AB (ref 6.5–8.1)

## 2016-10-30 LAB — CBG MONITORING, ED: Glucose-Capillary: 332 mg/dL — ABNORMAL HIGH (ref 65–99)

## 2016-10-30 LAB — CBC
HEMATOCRIT: 39.5 % (ref 39.0–52.0)
Hemoglobin: 13.4 g/dL (ref 13.0–17.0)
MCH: 30.5 pg (ref 26.0–34.0)
MCHC: 33.9 g/dL (ref 30.0–36.0)
MCV: 89.8 fL (ref 78.0–100.0)
Platelets: 128 10*3/uL — ABNORMAL LOW (ref 150–400)
RBC: 4.4 MIL/uL (ref 4.22–5.81)
RDW: 18 % — ABNORMAL HIGH (ref 11.5–15.5)
WBC: 34.1 10*3/uL — AB (ref 4.0–10.5)

## 2016-10-30 LAB — RETICULOCYTES
RBC.: 4.42 MIL/uL (ref 4.22–5.81)
Retic Count, Absolute: 123.8 10*3/uL (ref 19.0–186.0)
Retic Ct Pct: 2.8 % (ref 0.4–3.1)

## 2016-10-30 LAB — MAGNESIUM: Magnesium: 1.9 mg/dL (ref 1.7–2.4)

## 2016-10-30 LAB — PHOSPHORUS: PHOSPHORUS: 4.6 mg/dL (ref 2.5–4.6)

## 2016-10-30 LAB — PROTIME-INR
INR: 1.89
Prothrombin Time: 22 seconds — ABNORMAL HIGH (ref 11.4–15.2)

## 2016-10-30 LAB — MONONUCLEOSIS SCREEN: Mono Screen: NEGATIVE

## 2016-10-30 LAB — OSMOLALITY: OSMOLALITY: 335 mosm/kg — AB (ref 275–295)

## 2016-10-30 LAB — CREATININE, URINE, RANDOM: CREATININE, URINE: 44.22 mg/dL

## 2016-10-30 LAB — GLUCOSE, CAPILLARY
Glucose-Capillary: 306 mg/dL — ABNORMAL HIGH (ref 65–99)
Glucose-Capillary: 246 mg/dL — ABNORMAL HIGH (ref 65–99)
Glucose-Capillary: 164 mg/dL — ABNORMAL HIGH (ref 65–99)

## 2016-10-30 LAB — VITAMIN B12: Vitamin B-12: 354 pg/mL (ref 180–914)

## 2016-10-30 LAB — TSH: TSH: 0.682 u[IU]/mL (ref 0.350–4.500)

## 2016-10-30 LAB — OSMOLALITY, URINE: Osmolality, Ur: 310 mOsm/kg (ref 300–900)

## 2016-10-30 LAB — SODIUM, URINE, RANDOM: SODIUM UR: 37 mmol/L

## 2016-10-30 LAB — AMMONIA: Ammonia: 19 umol/L (ref 9–35)

## 2016-10-30 LAB — MRSA PCR SCREENING: MRSA by PCR: NEGATIVE

## 2016-10-30 LAB — LACTIC ACID, PLASMA: Lactic Acid, Venous: 3 mmol/L (ref 0.5–1.9)

## 2016-10-30 MED ORDER — INSULIN ASPART 100 UNIT/ML ~~LOC~~ SOLN
0.0000 [IU] | Freq: Three times a day (TID) | SUBCUTANEOUS | Status: DC
  Administered 2016-10-30: 5 [IU] via SUBCUTANEOUS
  Administered 2016-10-31: 2 [IU] via SUBCUTANEOUS
  Administered 2016-10-31: 5 [IU] via SUBCUTANEOUS
  Administered 2016-11-01: 3 [IU] via SUBCUTANEOUS
  Administered 2016-11-01: 5 [IU] via SUBCUTANEOUS
  Administered 2016-11-01: 2 [IU] via SUBCUTANEOUS
  Administered 2016-11-02: 8 [IU] via SUBCUTANEOUS
  Administered 2016-11-02 – 2016-11-03 (×3): 3 [IU] via SUBCUTANEOUS
  Administered 2016-11-04: 2 [IU] via SUBCUTANEOUS
  Administered 2016-11-04 – 2016-11-05 (×3): 3 [IU] via SUBCUTANEOUS
  Administered 2016-11-05: 8 [IU] via SUBCUTANEOUS
  Administered 2016-11-05: 3 [IU] via SUBCUTANEOUS
  Administered 2016-11-06 (×2): 5 [IU] via SUBCUTANEOUS
  Administered 2016-11-06: 3 [IU] via SUBCUTANEOUS
  Administered 2016-11-07 (×2): 5 [IU] via SUBCUTANEOUS

## 2016-10-30 MED ORDER — ONDANSETRON HCL 4 MG/2ML IJ SOLN
4.0000 mg | Freq: Four times a day (QID) | INTRAMUSCULAR | Status: DC | PRN
  Administered 2016-10-30: 4 mg via INTRAVENOUS

## 2016-10-30 MED ORDER — WARFARIN - PHYSICIAN DOSING INPATIENT
Freq: Every day | Status: DC
  Administered 2016-10-30 – 2016-10-31 (×2)

## 2016-10-30 MED ORDER — DEXTROSE 5 % IV SOLN
2.0000 g | INTRAVENOUS | Status: DC
  Administered 2016-10-30: 2 g via INTRAVENOUS
  Filled 2016-10-30: qty 2

## 2016-10-30 MED ORDER — HYDROCODONE-ACETAMINOPHEN 5-325 MG PO TABS
1.0000 | ORAL_TABLET | Freq: Once | ORAL | Status: AC
  Administered 2016-10-30: 1 via ORAL
  Filled 2016-10-30: qty 1

## 2016-10-30 MED ORDER — SODIUM CHLORIDE 0.9 % IV SOLN
INTRAVENOUS | Status: DC
  Administered 2016-10-30: 1000 mL via INTRAVENOUS
  Administered 2016-10-30: 09:00:00 via INTRAVENOUS

## 2016-10-30 MED ORDER — ALUM & MAG HYDROXIDE-SIMETH 200-200-20 MG/5ML PO SUSP
30.0000 mL | ORAL | Status: DC | PRN
  Administered 2016-10-30: 30 mL via ORAL
  Filled 2016-10-30: qty 30

## 2016-10-30 MED ORDER — ONDANSETRON HCL 4 MG/2ML IJ SOLN
INTRAMUSCULAR | Status: AC
  Filled 2016-10-30: qty 2

## 2016-10-30 MED ORDER — INSULIN ASPART 100 UNIT/ML ~~LOC~~ SOLN
0.0000 [IU] | Freq: Three times a day (TID) | SUBCUTANEOUS | Status: DC
  Administered 2016-10-30: 7 [IU] via SUBCUTANEOUS
  Filled 2016-10-30: qty 1

## 2016-10-30 MED ORDER — WARFARIN SODIUM 2.5 MG PO TABS
2.5000 mg | ORAL_TABLET | Freq: Every day | ORAL | Status: DC
  Administered 2016-10-30 – 2016-11-01 (×3): 2.5 mg via ORAL
  Filled 2016-10-30 (×4): qty 1

## 2016-10-30 MED ORDER — ALBUTEROL SULFATE HFA 108 (90 BASE) MCG/ACT IN AERS: 2.0000 | INHALATION_SPRAY | Freq: Four times a day (QID) | RESPIRATORY_TRACT | Status: DC | PRN

## 2016-10-30 MED ORDER — INSULIN GLARGINE 100 UNIT/ML ~~LOC~~ SOLN
10.0000 [IU] | Freq: Every day | SUBCUTANEOUS | Status: DC
Start: 2016-10-30 — End: 2016-11-07
  Administered 2016-10-30 – 2016-11-06 (×8): 10 [IU] via SUBCUTANEOUS
  Filled 2016-10-30 (×8): qty 0.1

## 2016-10-30 MED ORDER — LIDOCAINE 5 % EX PTCH
1.0000 | MEDICATED_PATCH | CUTANEOUS | Status: DC
  Administered 2016-10-30 – 2016-11-06 (×8): 1 via TRANSDERMAL
  Filled 2016-10-30 (×9): qty 1

## 2016-10-30 MED ORDER — PROMETHAZINE HCL 25 MG/ML IJ SOLN
12.5000 mg | Freq: Once | INTRAMUSCULAR | Status: AC
  Administered 2016-10-31: 12.5 mg via INTRAVENOUS
  Filled 2016-10-30: qty 1

## 2016-10-30 MED ORDER — DEXTROSE 5 % IV SOLN: 1.0000 g | Freq: Once | INTRAVENOUS | Status: DC

## 2016-10-30 MED ORDER — METOPROLOL TARTRATE 25 MG PO TABS
12.5000 mg | ORAL_TABLET | Freq: Two times a day (BID) | ORAL | Status: DC
Start: 2016-10-30 — End: 2016-10-30

## 2016-10-30 MED ORDER — SIMVASTATIN 20 MG PO TABS
20.0000 mg | ORAL_TABLET | Freq: Every day | ORAL | Status: DC
  Administered 2016-10-30 – 2016-11-04 (×6): 20 mg via ORAL
  Filled 2016-10-30 (×7): qty 1

## 2016-10-30 MED ORDER — ALBUTEROL SULFATE (2.5 MG/3ML) 0.083% IN NEBU
2.5000 mg | INHALATION_SOLUTION | Freq: Four times a day (QID) | RESPIRATORY_TRACT | Status: DC | PRN
  Administered 2016-10-31: 2.5 mg via RESPIRATORY_TRACT
  Filled 2016-10-30 (×2): qty 3

## 2016-10-30 MED ORDER — LIDOCAINE 5 % EX PTCH
1.0000 | MEDICATED_PATCH | Freq: Once | CUTANEOUS | Status: AC
  Administered 2016-10-30: 1 via TRANSDERMAL
  Filled 2016-10-30: qty 1

## 2016-10-30 MED ORDER — SODIUM CHLORIDE 0.9 % IV BOLUS (SEPSIS)
500.0000 mL | Freq: Once | INTRAVENOUS | Status: AC
  Administered 2016-10-30: 500 mL via INTRAVENOUS

## 2016-10-30 MED ORDER — MOMETASONE FURO-FORMOTEROL FUM 200-5 MCG/ACT IN AERO
2.0000 | INHALATION_SPRAY | Freq: Two times a day (BID) | RESPIRATORY_TRACT | Status: DC
  Administered 2016-10-30 – 2016-11-07 (×16): 2 via RESPIRATORY_TRACT
  Filled 2016-10-30 (×2): qty 8.8

## 2016-10-30 MED ORDER — TIOTROPIUM BROMIDE MONOHYDRATE 18 MCG IN CAPS
18.0000 ug | ORAL_CAPSULE | Freq: Every day | RESPIRATORY_TRACT | Status: DC
  Administered 2016-10-30 – 2016-10-31 (×2): 18 ug via RESPIRATORY_TRACT
  Filled 2016-10-30: qty 5

## 2016-10-30 MED ORDER — ENOXAPARIN SODIUM 60 MG/0.6ML ~~LOC~~ SOLN
60.0000 mg | SUBCUTANEOUS | Status: DC
  Administered 2016-10-30: 60 mg via SUBCUTANEOUS
  Filled 2016-10-30: qty 0.6

## 2016-10-30 MED ORDER — INSULIN ASPART 100 UNIT/ML ~~LOC~~ SOLN
0.0000 [IU] | Freq: Every day | SUBCUTANEOUS | Status: DC
  Administered 2016-10-31: 2 [IU] via SUBCUTANEOUS
  Administered 2016-11-04: 5 [IU] via SUBCUTANEOUS
  Administered 2016-11-06: 2 [IU] via SUBCUTANEOUS

## 2016-10-30 NOTE — Progress Notes (Signed)
Patient has been complaining of severe nausea over the last couple of hours. He has been belching frequently. K. Schorr was notified and orderes received for zofran IV. Zofran was given with no relief. Maalox was also given. Performed bedside EKG which showed RBBB that he was already noted to have. Raliegh Ip Schorr was notified of critical Lactic Acid of 3.0 and that patent still felt nauseated and was very uncomfortable. New orders received for 500 cc ns bolus which is infusing now.Will continue to monitor closely and notify MD as needed.

## 2016-10-30 NOTE — Progress Notes (Signed)
CRITICAL VALUE ALERT  Critical value received:  Lactic acid 3.0  Date of notification:  10/30/2016  Time of notification:  2240  Critical value read back:yes  Nurse who received alert:  Val Riles, RN  MD notified (1st page):  Raliegh Ip Schorr  Time of first page: 2245  MD notified (2nd page):  Time of second page:  Responding MD:  Lamar Blinks  Time MD [id number]

## 2016-10-30 NOTE — Progress Notes (Signed)
[facility] Progress Note  Patient: William Braun [id number]   PCP: Leeroy Cha, MD DOB: [birth year]   DOA: 10/29/2016   DOS: 10/30/2016   Date of Service: the patient was seen and examined on 10/30/2016   Subjective: Feeling better, no confusion. Has some shortness of breath.  Brief hospital course: Pt. with PMH of CHF, HTN, type II DM, A. fib on Coumadin; admitted on 10/29/2016, with complaint of infusion and shortness of breath, was found to have UTI. Currently further plan is continue antibiotics and monitor renal function.  Assessment and Plan:  1 Acute encephalopathy- UTI Patient presented with confusion, which family states is different than his baseline mental status. Elevated BUN and other metabolic disturbances such as hypokalemia are likely contributing.  Patient's urinalysis also raises the suspicion for urinary tract infection,  CVA is possible, though neuro exam is nonfocal and patient takes warfarin for atrial fibrillation.  doubt underlying bacterial meningitis. Empirically treat for complicated urinary tract infection. urine culture to tailor antibiotic choice. If no improvement in mental status, patient will need CT head.  2 Atrial fibrillation Patient has a long-standing history of atrial fibrillation with elevated chadsVas score. He takes warfarin at home.  Heart rates are in the 90-100 range in the emergency department.  Will continue home beta blocker and warfarin.  3 Diabetes mellitus type 2 Patient takes Lantus at home. Will continue Lantus and add sliding scale insulin for glycemic control.  4 Diastolic congestive heart failure Patient seems to have more right-sided disease. Clinical presentation and history suggest dehydration given extremely poor oral intake over the preceding 3 days.  Given aggressive volume resuscitation in the emergency department, patient may benefit from Lasix at a future date.  Will continue to monitor volume  status. Will hold home diuretics for the time being.   Bowel regimen: last BM prior to admission Diet: cardiac diet DVT Prophylaxis: subcutaneous Heparin  Advance goals of care discussion: full code  Family Communication: no family was present at bedside, at the time of interview.  Disposition:  Discharge to home. Expected discharge date: 10/31/2016,   Consultants: none Procedures: none  Antibiotics: Anti-infectives    Start     Dose/Rate Route Frequency Ordered Stop   10/30/16 2200  cefTRIAXone (ROCEPHIN) 2 g in dextrose 5 % 50 mL IVPB     2 g 100 mL/hr over 30 Minutes Intravenous Every 24 hours 10/30/16 0219     10/30/16 0230  cefTRIAXone (ROCEPHIN) 1 g in dextrose 5 % 50 mL IVPB  Status:  Discontinued     1 g 100 mL/hr over 30 Minutes Intravenous  Once 10/30/16 0219 10/30/16 0229   10/29/16 2145  cefTRIAXone (ROCEPHIN) 1 g in dextrose 5 % 50 mL IVPB     1 g 100 mL/hr over 30 Minutes Intravenous  Once 10/29/16 2130 10/29/16 2227      Objective: Physical Exam: Vitals:   10/30/16 1015 10/30/16 1045 10/30/16 1130 10/30/16 1200  BP: 111/75 125/79 112/82 105/67  Pulse: 83 81 87 82  Resp: 16 22 20 18   Temp:      TempSrc:      SpO2: 95% 92% 93% 94%    Intake/Output Summary (Last 24 hours) at 10/30/16 1558 Last data filed at 10/30/16 0354  Gross per 24 hour  Intake             2550 ml  Output              175 ml  Net  2375 ml   There were no vitals filed for this visit.  General: Alert, Awake and Oriented to Time, Place and Person. Appear in mild distress, affect appropriate Eyes: PERRL, Conjunctiva normal ENT: Oral Mucosa clear moist. Neck: no JVD, no Abnormal Mass Or lumps Cardiovascular: S1 and S2 Present, no Murmur, Respiratory: Bilateral Air entry equal and Decreased, no use of accessory muscle, faint basal Crackles, no wheezes Abdomen: Bowel Sound present, Soft and no tenderness Skin: no redness, no Rash, no induration Extremities: trace Pedal  edema, no calf tenderness Neurologic: Grossly no focal neuro deficit. Bilaterally Equal motor strength  Data Reviewed: CBC:  Recent Labs Lab 10/29/16 2038 10/30/16 0238  WBC 38.6* 34.1*  NEUTROABS 16.6*  --   HGB 16.6 13.4  HCT 47.2 39.5  MCV 89.6 89.8  PLT 130* 0000000*   Basic Metabolic Panel:  Recent Labs Lab 10/29/16 2038 10/30/16 0238  NA 134* 134*  K 2.8* 3.8  CL 76* 83*  CO2 40* 33*  GLUCOSE 188* 376*  BUN 106* 105*  CREATININE 2.23* 2.21*  CALCIUM 9.7 8.5*  MG  --  1.9  PHOS  --  4.6    Liver Function Tests:  Recent Labs Lab 10/29/16 2038 10/30/16 0238  AST 32 31  ALT 18 18  ALKPHOS 79 61  BILITOT 1.6* 1.3*  PROT 7.6 6.2*  ALBUMIN 3.2* 2.5*   No results for input(s): LIPASE, AMYLASE in the last 168 hours.  Recent Labs Lab 10/30/16 0903  AMMONIA 19   Coagulation Profile:  Recent Labs Lab 10/29/16 2038 10/30/16 0238  INR 1.57 1.89   Cardiac Enzymes: No results for input(s): CKTOTAL, CKMB, CKMBINDEX, TROPONINI in the last 168 hours. BNP (last 3 results) No results for input(s): PROBNP in the last 8760 hours.  CBG:  Recent Labs Lab 10/30/16 0730 10/30/16 1237  GLUCAP 332* 306*    Studies: Dg Chest Portable 1 View  Result Date: 10/29/2016 CLINICAL DATA:  Acute onset of shortness of breath. Altered mental status. Initial encounter. EXAM: PORTABLE CHEST 1 VIEW COMPARISON:  Chest radiograph performed 10/19/2016 FINDINGS: There is elevation of the right hemidiaphragm. There is no evidence of effusion or pneumothorax. The cardiomediastinal silhouette is borderline normal in size. No acute osseous abnormalities are seen. IMPRESSION: Elevation of the right hemidiaphragm.  Lungs remain grossly clear. Electronically Signed   By: Garald Balding M.D.   On: 10/29/2016 20:47     Scheduled Meds: . cefTRIAXone (ROCEPHIN)  IV  2 g Intravenous Q24H  . insulin aspart  0-15 Units Subcutaneous TID WC  . insulin aspart  0-5 Units Subcutaneous QHS  .  insulin glargine  10 Units Subcutaneous Q2200  . lidocaine  1 patch Transdermal Q24H  . mometasone-formoterol  2 puff Inhalation BID  . simvastatin  20 mg Oral q1800  . tiotropium  18 mcg Inhalation Daily  . warfarin  2.5 mg Oral q1800  . Warfarin - Physician Dosing Inpatient   Does not apply q1800   Continuous Infusions: . sodium chloride 100 mL/hr at 10/30/16 0855   PRN Meds: albuterol  Time spent: 30 minutes  Author: Berle Mull, MD [facility] Pager: [phone] 10/30/2016 3:58 PM  If 7PM-7AM, please contact night-coverage at [email or web address], password [facility]

## 2016-10-30 NOTE — Progress Notes (Signed)
[facility]  Patient Status: Active (receiving services up to time of hospitalization)  [facility] is providing the following services: RN and PT  If patient discharges after hours, please call [phone].   William Braun 10/30/2016, 12:58 PM

## 2016-10-30 NOTE — ED Notes (Signed)
Pt's son Cletus Gash phone number [phone] would like to be notified when pt gets a bed assignment/any updates.

## 2016-10-30 NOTE — ED Notes (Signed)
Turned pt. . Pt eating breakfast

## 2016-10-30 NOTE — ED Notes (Signed)
Turned pt and placed allevyn pad on left buttocks

## 2016-10-30 NOTE — ED Notes (Signed)
CRITICAL VALUE ALERT  Critical value received:  Osmolality 335  Date of notification:  10/30/2016   Time of notification: [id number]  Critical value read back:Yes.    Nurse who received alert:  Leodis Rains  MD notified (1st page):  Dr. Posey Pronto No further orders

## 2016-10-31 ENCOUNTER — Inpatient Hospital Stay (HOSPITAL_COMMUNITY): Payer: PPO

## 2016-10-31 ENCOUNTER — Encounter (HOSPITAL_COMMUNITY): Payer: Self-pay

## 2016-10-31 DIAGNOSIS — I5033 Acute on chronic diastolic (congestive) heart failure: Secondary | ICD-10-CM | POA: Diagnosis present

## 2016-10-31 LAB — CBC WITH DIFFERENTIAL/PLATELET
Basophils Absolute: 0 10*3/uL (ref 0.0–0.1)
Basophils Relative: 0 %
EOS ABS: 0 10*3/uL (ref 0.0–0.7)
EOS PCT: 0 %
HCT: 40 % (ref 39.0–52.0)
Hemoglobin: 13.4 g/dL (ref 13.0–17.0)
LYMPHS ABS: 16.3 10*3/uL — AB (ref 0.7–4.0)
Lymphocytes Relative: 46 %
MCH: 30.5 pg (ref 26.0–34.0)
MCHC: 33.5 g/dL (ref 30.0–36.0)
MCV: 91.1 fL (ref 78.0–100.0)
MONO ABS: 1.4 10*3/uL — AB (ref 0.1–1.0)
Monocytes Relative: 4 %
Neutro Abs: 17.8 10*3/uL — ABNORMAL HIGH (ref 1.7–7.7)
Neutrophils Relative %: 50 %
PLATELETS: 155 10*3/uL (ref 150–400)
RBC: 4.39 MIL/uL (ref 4.22–5.81)
RDW: 18 % — AB (ref 11.5–15.5)
WBC: 35.5 10*3/uL — AB (ref 4.0–10.5)

## 2016-10-31 LAB — BASIC METABOLIC PANEL
Anion gap: 11 (ref 5–15)
BUN: 79 mg/dL — AB (ref 6–20)
CHLORIDE: 94 mmol/L — AB (ref 101–111)
CO2: 39 mmol/L — ABNORMAL HIGH (ref 22–32)
CREATININE: 1.6 mg/dL — AB (ref 0.61–1.24)
Calcium: 9.1 mg/dL (ref 8.9–10.3)
GFR calc Af Amer: 45 mL/min — ABNORMAL LOW (ref >60)
GFR calc non Af Amer: 39 mL/min — ABNORMAL LOW (ref >60)
Glucose, Bld: 214 mg/dL — ABNORMAL HIGH (ref 65–99)
Potassium: 3.1 mmol/L — ABNORMAL LOW (ref 3.5–5.1)
SODIUM: 144 mmol/L (ref 135–145)

## 2016-10-31 LAB — COMPREHENSIVE METABOLIC PANEL
ALT: 17 U/L (ref 17–63)
AST: 26 U/L (ref 15–41)
Albumin: 2.7 g/dL — ABNORMAL LOW (ref 3.5–5.0)
Alkaline Phosphatase: 60 U/L (ref 38–126)
Anion gap: 14 (ref 5–15)
BUN: 94 mg/dL — ABNORMAL HIGH (ref 6–20)
CHLORIDE: 92 mmol/L — AB (ref 101–111)
CO2: 35 mmol/L — ABNORMAL HIGH (ref 22–32)
Calcium: 8.7 mg/dL — ABNORMAL LOW (ref 8.9–10.3)
Creatinine, Ser: 1.84 mg/dL — ABNORMAL HIGH (ref 0.61–1.24)
GFR, EST AFRICAN AMERICAN: 38 mL/min — AB (ref >60)
GFR, EST NON AFRICAN AMERICAN: 33 mL/min — AB (ref >60)
Glucose, Bld: 157 mg/dL — ABNORMAL HIGH (ref 65–99)
POTASSIUM: 3.3 mmol/L — AB (ref 3.5–5.1)
Sodium: 141 mmol/L (ref 135–145)
Total Bilirubin: 0.8 mg/dL (ref 0.3–1.2)
Total Protein: 6.5 g/dL (ref 6.5–8.1)

## 2016-10-31 LAB — GLUCOSE, CAPILLARY
GLUCOSE-CAPILLARY: 115 mg/dL — AB (ref 65–99)
GLUCOSE-CAPILLARY: 129 mg/dL — AB (ref 65–99)
GLUCOSE-CAPILLARY: 205 mg/dL — AB (ref 65–99)
Glucose-Capillary: 240 mg/dL — ABNORMAL HIGH (ref 65–99)

## 2016-10-31 LAB — LACTIC ACID, PLASMA
LACTIC ACID, VENOUS: 1.6 mmol/L (ref 0.5–1.9)
Lactic Acid, Venous: 2.6 mmol/L (ref 0.5–1.9)

## 2016-10-31 LAB — BLOOD GAS, ARTERIAL
ACID-BASE EXCESS: 14.3 mmol/L — AB (ref 0.0–2.0)
Bicarbonate: 39.2 mmol/L — ABNORMAL HIGH (ref 20.0–28.0)
DRAWN BY: 270221
O2 CONTENT: 4 L/min
O2 SAT: 86 %
Patient temperature: 98.6
pCO2 arterial: 57.5 mmHg — ABNORMAL HIGH (ref 32.0–48.0)
pH, Arterial: 7.448 (ref 7.350–7.450)
pO2, Arterial: 53 mmHg — ABNORMAL LOW (ref 83.0–108.0)

## 2016-10-31 LAB — PROTIME-INR
INR: 1.98
Prothrombin Time: 22.8 seconds — ABNORMAL HIGH (ref 11.4–15.2)

## 2016-10-31 LAB — HEMOGLOBIN A1C
Hgb A1c MFr Bld: 7.7 % — ABNORMAL HIGH (ref 4.8–5.6)
Mean Plasma Glucose: 174 mg/dL

## 2016-10-31 LAB — URINE CULTURE

## 2016-10-31 LAB — MAGNESIUM: MAGNESIUM: 2.2 mg/dL (ref 1.7–2.4)

## 2016-10-31 MED ORDER — SENNOSIDES-DOCUSATE SODIUM 8.6-50 MG PO TABS
1.0000 | ORAL_TABLET | Freq: Two times a day (BID) | ORAL | Status: DC
  Administered 2016-10-31 – 2016-11-06 (×14): 1 via ORAL
  Filled 2016-10-31 (×16): qty 1

## 2016-10-31 MED ORDER — METHYLPREDNISOLONE SODIUM SUCC 125 MG IJ SOLR: 125.0000 mg | Freq: Once | INTRAMUSCULAR | Status: DC

## 2016-10-31 MED ORDER — ORAL CARE MOUTH RINSE
15.0000 mL | Freq: Two times a day (BID) | OROMUCOSAL | Status: DC
  Administered 2016-11-04 – 2016-11-07 (×7): 15 mL via OROMUCOSAL

## 2016-10-31 MED ORDER — POTASSIUM CHLORIDE CRYS ER 20 MEQ PO TBCR
40.0000 meq | EXTENDED_RELEASE_TABLET | Freq: Once | ORAL | Status: AC
  Administered 2016-10-31: 40 meq via ORAL

## 2016-10-31 MED ORDER — PIPERACILLIN-TAZOBACTAM 3.375 G IVPB
3.3750 g | Freq: Three times a day (TID) | INTRAVENOUS | Status: DC
  Filled 2016-10-31: qty 50

## 2016-10-31 MED ORDER — FUROSEMIDE 10 MG/ML IJ SOLN
40.0000 mg | Freq: Once | INTRAMUSCULAR | Status: AC
  Administered 2016-10-31: 40 mg via INTRAVENOUS
  Filled 2016-10-31: qty 4

## 2016-10-31 MED ORDER — ALBUMIN HUMAN 25 % IV SOLN
25.0000 g | Freq: Once | INTRAVENOUS | Status: AC
  Administered 2016-10-31: 25 g via INTRAVENOUS
  Filled 2016-10-31: qty 100

## 2016-10-31 MED ORDER — IPRATROPIUM-ALBUTEROL 0.5-2.5 (3) MG/3ML IN SOLN
3.0000 mL | Freq: Four times a day (QID) | RESPIRATORY_TRACT | Status: DC
  Administered 2016-10-31 – 2016-11-01 (×3): 3 mL via RESPIRATORY_TRACT
  Filled 2016-10-31 (×3): qty 3

## 2016-10-31 MED ORDER — CEPHALEXIN 500 MG PO CAPS: 500.0000 mg | ORAL_CAPSULE | Freq: Three times a day (TID) | ORAL | Status: DC

## 2016-10-31 MED ORDER — CEPHALEXIN 500 MG PO CAPS
500.0000 mg | ORAL_CAPSULE | Freq: Two times a day (BID) | ORAL | Status: DC
  Administered 2016-10-31 – 2016-11-02 (×5): 500 mg via ORAL
  Filled 2016-10-31 (×5): qty 1

## 2016-10-31 MED ORDER — POLYETHYLENE GLYCOL 3350 17 G PO PACK
17.0000 g | PACK | Freq: Every day | ORAL | Status: DC
  Administered 2016-11-01 – 2016-11-06 (×6): 17 g via ORAL
  Filled 2016-10-31 (×7): qty 1

## 2016-10-31 MED ORDER — PIPERACILLIN-TAZOBACTAM 3.375 G IVPB 30 MIN
3.3750 g | Freq: Once | INTRAVENOUS | Status: DC
  Filled 2016-10-31: qty 50

## 2016-10-31 MED ORDER — CHLORHEXIDINE GLUCONATE 0.12 % MT SOLN
15.0000 mL | Freq: Two times a day (BID) | OROMUCOSAL | Status: DC
  Administered 2016-10-31 – 2016-11-07 (×14): 15 mL via OROMUCOSAL
  Filled 2016-10-31 (×13): qty 15

## 2016-10-31 MED ORDER — ALPRAZOLAM 0.25 MG PO TABS
0.1250 mg | ORAL_TABLET | Freq: Two times a day (BID) | ORAL | Status: DC | PRN
  Administered 2016-10-31 – 2016-11-04 (×5): 0.125 mg via ORAL
  Filled 2016-10-31 (×5): qty 1

## 2016-10-31 NOTE — Progress Notes (Signed)
Pharmacy Antibiotic Note  William Braun is a 83 y.o. male concern of sepsis.  Pharmacy has been consulted for zosyn dosing. He has been on rocephin for UTI -WBC= 35.5, afebrile, CrCL ~ 40  Plan: -Zosyn 3.375gm IV q8h -Will follow renal function, cultures and clinical progress     Temp (24hrs), Avg:97.8 F (36.6 C), Min:97.4 F (36.3 C), Max:98 F (36.7 C)   Recent Labs Lab 10/29/16 2038 10/29/16 2046 10/29/16 2341 10/30/16 0238 10/30/16 1447 10/31/16 0226 10/31/16 0459  WBC 38.6*  --   --  34.1*  --  35.5*  --   CREATININE 2.23*  --   --  2.21*  --  1.84*  --   LATICACIDVEN  --  2.79* 3.61*  --  3.0* 2.6* 1.6    Estimated Creatinine Clearance: 43 mL/min (by C-G formula based on SCr of 1.84 mg/dL (H)).    Allergies  Allergen Reactions  . Quinine Palpitations and Other (See Comments)    Seizure     Antimicrobials this admission: 2/10 rocephin>>2/12 2/12 zosyn>>  Dose adjustments this admission:   Microbiology results: 2/11 MRSA PCR- neg 2/12 urine- neg 2/10 blood x2- ngtd  Thank you for allowing pharmacy to be a part of this patient's care.  Hildred Laser, Pharm D 10/31/2016 10:18 AM

## 2016-10-31 NOTE — Care Management Note (Signed)
Case Management Note  Patient Details  Name: William Braun MRN: [id number] Date of Birth: 1932/11/15  Subjective/Objective:     Adm w sepsis               Action/Plan: lives at home w fam, pcp dr Fara Olden. Act w [facility] for hhrn and hhpt pta.   Expected Discharge Date:                  Expected Discharge Plan:  [facility]  In-House Referral:     Discharge planning Services  CM Consult  Post Acute Care Choice:  Resumption of Svcs/PTA Provider Choice offered to:     DME Arranged:    DME Agency:     HH Arranged:  PT, RN [place] Agency:  [facility]  Status of Service:  In process, will continue to follow  If discussed at Long Length of Stay Meetings, dates discussed:    Additional Comments: [facility] aware pt in hosp.  Lacretia Leigh, RN 10/31/2016, 10:47 AM

## 2016-10-31 NOTE — Progress Notes (Signed)
Notified K. Schorr that lactic acid is now 1.6 and was previously 2.6. He states that he is feeling much better now and has not complained any more of stomach upset or nausea after receiving phenergan and also passing gas.

## 2016-10-31 NOTE — Progress Notes (Addendum)
[facility] Progress Note  Patient: William Braun [id number]   PCP: Leeroy Cha, MD DOB: Feb 12, 1933   DOA: 10/29/2016   DOS: 10/31/2016   Date of Service: the patient was seen and examined on 10/31/2016   Subjective: Feeling better, no confusion. Has some shortness of breath.  Brief hospital course: Pt. with PMH of CHF, HTN, type II DM, A. fib on Coumadin; admitted on 10/29/2016, with complaint of infusion and shortness of breath, was found to have UTI. Currently further plan is continue antibiotics and monitor Respiratory function.  Assessment and Plan:  1 Acute encephalopathy- UTI, Resolved Patient presented with confusion, which family states is different than his baseline mental status. Elevated BUN and other metabolic disturbances such as hypokalemia are likely contributing.  Patient's urinalysis also raises the suspicion for urinary tract infection,  Empirically treat for complicated urinary tract infection. Changing antibiotics to oral Keflex Blood cultures negative for 2 days, urine culture no significant growth  2 acute on chronic hypoxic respiratory failure. Acute on chronic diastolic CHF. UnSpecified pulmonary hypertension History of COPD on 3 L of oxygen at home, OSA on C Pap Given aggressive volume resuscitation in the emergency department, I will give him one dose of IV Lasix after albumin. BiPAP when necessary. ABG shows primary respiratory alkalosis with respiratory acidosis. C Pap daily at bedtime. Chest x-ray shows mild vascular congestion examination shows basal crackles. Changed to DuoNeb's, does not appear to have any COPD exacerbation at present.  3 Diabetes mellitus type 2 Patient takes Lantus at home. Will continue Lantus and add sliding scale insulin for glycemic control. Hemoglobin A1c 7.7, uncontrolled.  4. Leukocytosis. History of CLL. Patient presented with leukocytosis concerning for severe sepsis although patient has history of  CLL, follows up with oncology,. We will check IgG levels.  5.Atrial fibrillation Patient has a long-standing history of atrial fibrillation with elevated chadsVas score. He takes warfarin at home. Rate controlled Beta blocker currently on hold due to hypotension  5. Chronic kidney disease stage IV. Hypokalemia Mild worsening on admission, receiving IV fluids. Chronic elevation of BUN with acute worsening suspected initially from dehydration. Noted to acute volume overload overnight. Renal function shows mild improvement. We will use IV albumin followed by Lasix and recheck BMP later in the afternoon. Replace potassium.  6. Lactic acidosis. Etiology unclear suspected sepsis on admission patient received IV fluids. Lactic acid values normal this morning. We'll monitor.  Bowel regimen: last BM 10/28/2016, bowel regimen initiated Diet: cardiac diet DVT Prophylaxis: Warfarin  Advance goals of care discussion: full code  Family Communication: no family was present at bedside, at the time of interview.  Disposition:  Discharge to SNF. Expected discharge date: 11/03/2016, stabilization of respiratory status and renal function  Consultants: none Procedures: BiPAP  Antibiotics: Anti-infectives    Start     Dose/Rate Route Frequency Ordered Stop   10/31/16 1700  piperacillin-tazobactam (ZOSYN) IVPB 3.375 g  Status:  Discontinued     3.375 g 12.5 mL/hr over 240 Minutes Intravenous Every 8 hours 10/31/16 1019 10/31/16 1109   10/31/16 1400  cephALEXin (KEFLEX) capsule 500 mg  Status:  Discontinued     500 mg Oral Every 8 hours 10/31/16 1109 10/31/16 1119   10/31/16 1130  cephALEXin (KEFLEX) capsule 500 mg     500 mg Oral Every 12 hours 10/31/16 1119     10/31/16 1100  piperacillin-tazobactam (ZOSYN) IVPB 3.375 g  Status:  Discontinued     3.375 g 100 mL/hr over 30 Minutes  Intravenous  Once 10/31/16 1019 10/31/16 1109   10/30/16 2200  cefTRIAXone (ROCEPHIN) 2 g in dextrose 5 %  50 mL IVPB  Status:  Discontinued     2 g 100 mL/hr over 30 Minutes Intravenous Every 24 hours 10/30/16 0219 10/31/16 1006   10/30/16 0230  cefTRIAXone (ROCEPHIN) 1 g in dextrose 5 % 50 mL IVPB  Status:  Discontinued     1 g 100 mL/hr over 30 Minutes Intravenous  Once 10/30/16 0219 10/30/16 0229   10/29/16 2145  cefTRIAXone (ROCEPHIN) 1 g in dextrose 5 % 50 mL IVPB     1 g 100 mL/hr over 30 Minutes Intravenous  Once 10/29/16 2130 10/29/16 2227      Objective: Physical Exam: Vitals:   10/31/16 0814 10/31/16 0829 10/31/16 1036 10/31/16 1100  BP:    (!) 124/94  Pulse:   (!) 101 94  Resp:   (!) 27 (!) 21  Temp:      TempSrc:      SpO2: 92% 95% 97% 94%    Intake/Output Summary (Last 24 hours) at 10/31/16 1320 Last data filed at 10/31/16 1200  Gross per 24 hour  Intake          2901.67 ml  Output             1850 ml  Net          1051.67 ml   There were no vitals filed for this visit.  General: Alert, Awake and Oriented to Time, Place and Person. Appear in moderate distress, Eyes: PERRL, Conjunctiva normal ENT: Oral Mucosa clear moist. Neck: no JVD, no Abnormal Mass Or lumps Cardiovascular: S1 and S2 Present, no Murmur, Respiratory: Bilateral Air entry equal and Decreased, no use of accessory muscle, basal Crackles, Occasional wheezes Abdomen: Bowel Sound present, Soft and no tenderness Skin: no redness, no Rash, no induration Extremities: bilateral Pedal edema, no calf tenderness Neurologic: Grossly no focal neuro deficit. Bilaterally Equal motor strength  Data Reviewed: CBC:  Recent Labs Lab 10/29/16 2038 10/30/16 0238 10/31/16 0226  WBC 38.6* 34.1* 35.5*  NEUTROABS 16.6*  --  17.8*  HGB 16.6 13.4 13.4  HCT 47.2 39.5 40.0  MCV 89.6 89.8 91.1  PLT 130* 128* [id number]   Basic Metabolic Panel:  Recent Labs Lab 10/29/16 2038 10/30/16 0238 10/31/16 0226  NA 134* 134* 141  K 2.8* 3.8 3.3*  CL 76* 83* 92*  CO2 40* 33* 35*  GLUCOSE 188* 376* 157*  BUN 106* 105* 94*   CREATININE 2.23* 2.21* 1.84*  CALCIUM 9.7 8.5* 8.7*  MG  --  1.9 2.2  PHOS  --  4.6  --     Liver Function Tests:  Recent Labs Lab 10/29/16 2038 10/30/16 0238 10/31/16 0226  AST 32 31 26  ALT 18 18 17   ALKPHOS 79 61 60  BILITOT 1.6* 1.3* 0.8  PROT 7.6 6.2* 6.5  ALBUMIN 3.2* 2.5* 2.7*   No results for input(s): LIPASE, AMYLASE in the last 168 hours.  Recent Labs Lab 10/30/16 0903  AMMONIA 19   Coagulation Profile:  Recent Labs Lab 10/29/16 2038 10/30/16 0238 10/31/16 0226  INR 1.57 1.89 1.98   Cardiac Enzymes: No results for input(s): CKTOTAL, CKMB, CKMBINDEX, TROPONINI in the last 168 hours. BNP (last 3 results) No results for input(s): PROBNP in the last 8760 hours.  CBG:  Recent Labs Lab 10/30/16 1237 10/30/16 1704 10/30/16 2111 10/31/16 0800 10/31/16 1126  GLUCAP 306* 246* 164* 115* 129*  Studies: Dg Chest Port 1 View  Result Date: 10/31/2016 CLINICAL DATA:  83 year old with acute onset shortness of breath. EXAM: PORTABLE CHEST 1 VIEW COMPARISON:  10/29/2016, 10/19/2016 and earlier, including CT chest 09/19/2016. FINDINGS: Cardiac silhouette moderately to markedly enlarged, unchanged. Mild diffuse interstitial pulmonary edema, new since the examination 2 days ago. No visible pleural effusions. Stable chronic elevation of the right hemidiaphragm. IMPRESSION: Acute mild CHF, with stable moderate to marked cardiomegaly and new mild interstitial pulmonary edema. Electronically Signed   By: Evangeline Dakin M.D.   On: 10/31/2016 10:29     Scheduled Meds: . cephALEXin  500 mg Oral Q12H  . furosemide  40 mg Intravenous Once  . insulin aspart  0-15 Units Subcutaneous TID WC  . insulin aspart  0-5 Units Subcutaneous QHS  . insulin glargine  10 Units Subcutaneous Q2200  . ipratropium-albuterol  3 mL Nebulization Q6H  . lidocaine  1 patch Transdermal Q24H  . mometasone-formoterol  2 puff Inhalation BID  . simvastatin  20 mg Oral q1800  . warfarin  2.5  mg Oral q1800  . Warfarin - Physician Dosing Inpatient   Does not apply q1800   Continuous Infusions:  PRN Meds: albuterol, ALPRAZolam, alum & mag hydroxide-simeth, ondansetron  Time spent: 30 minutes  Author: Berle Mull, MD [facility] Pager: [phone] 10/31/2016 1:20 PM  If 7PM-7AM, please contact night-coverage at [email or web address], password [facility]

## 2016-11-01 LAB — COMPREHENSIVE METABOLIC PANEL
ALT: 16 U/L — ABNORMAL LOW (ref 17–63)
AST: 22 U/L (ref 15–41)
Albumin: 2.9 g/dL — ABNORMAL LOW (ref 3.5–5.0)
Alkaline Phosphatase: 57 U/L (ref 38–126)
Anion gap: 12 (ref 5–15)
BILIRUBIN TOTAL: 0.9 mg/dL (ref 0.3–1.2)
BUN: 70 mg/dL — ABNORMAL HIGH (ref 6–20)
CHLORIDE: 90 mmol/L — AB (ref 101–111)
CO2: 40 mmol/L — ABNORMAL HIGH (ref 22–32)
Calcium: 9.3 mg/dL (ref 8.9–10.3)
Creatinine, Ser: 1.55 mg/dL — ABNORMAL HIGH (ref 0.61–1.24)
GFR, EST AFRICAN AMERICAN: 47 mL/min — AB (ref >60)
GFR, EST NON AFRICAN AMERICAN: 40 mL/min — AB (ref >60)
Glucose, Bld: 151 mg/dL — ABNORMAL HIGH (ref 65–99)
POTASSIUM: 3.1 mmol/L — AB (ref 3.5–5.1)
Sodium: 142 mmol/L (ref 135–145)
TOTAL PROTEIN: 6.4 g/dL — AB (ref 6.5–8.1)

## 2016-11-01 LAB — PATHOLOGIST SMEAR REVIEW

## 2016-11-01 LAB — CBC WITH DIFFERENTIAL/PLATELET
Basophils Absolute: 0 K/uL (ref 0.0–0.1)
Basophils Relative: 0 %
Eosinophils Absolute: 0.3 K/uL (ref 0.0–0.7)
Eosinophils Relative: 1 %
HCT: 40.4 % (ref 39.0–52.0)
Hemoglobin: 13.1 g/dL (ref 13.0–17.0)
Lymphocytes Relative: 50 %
Lymphs Abs: 12.9 K/uL — ABNORMAL HIGH (ref 0.7–4.0)
MCH: 30.2 pg (ref 26.0–34.0)
MCHC: 32.4 g/dL (ref 30.0–36.0)
MCV: 93.1 fL (ref 78.0–100.0)
Monocytes Absolute: 1.6 K/uL — ABNORMAL HIGH (ref 0.1–1.0)
Monocytes Relative: 6 %
Neutro Abs: 11.1 K/uL — ABNORMAL HIGH (ref 1.7–7.7)
Neutrophils Relative %: 43 %
Platelets: 135 K/uL — ABNORMAL LOW (ref 150–400)
RBC: 4.34 MIL/uL (ref 4.22–5.81)
RDW: 18.3 % — ABNORMAL HIGH (ref 11.5–15.5)
WBC: 25.9 K/uL — ABNORMAL HIGH (ref 4.0–10.5)

## 2016-11-01 LAB — GLUCOSE, CAPILLARY
GLUCOSE-CAPILLARY: 151 mg/dL — AB (ref 65–99)
GLUCOSE-CAPILLARY: 145 mg/dL — AB (ref 65–99)
Glucose-Capillary: 130 mg/dL — ABNORMAL HIGH (ref 65–99)
Glucose-Capillary: 205 mg/dL — ABNORMAL HIGH (ref 65–99)

## 2016-11-01 LAB — PROTIME-INR
INR: 2.28
Prothrombin Time: 25.5 s — ABNORMAL HIGH (ref 11.4–15.2)

## 2016-11-01 LAB — MAGNESIUM: Magnesium: 2.4 mg/dL (ref 1.7–2.4)

## 2016-11-01 MED ORDER — FUROSEMIDE 10 MG/ML IJ SOLN
40.0000 mg | Freq: Two times a day (BID) | INTRAMUSCULAR | Status: DC
  Administered 2016-11-01 – 2016-11-03 (×6): 40 mg via INTRAVENOUS
  Filled 2016-11-01 (×6): qty 4

## 2016-11-01 MED ORDER — IPRATROPIUM-ALBUTEROL 0.5-2.5 (3) MG/3ML IN SOLN
3.0000 mL | Freq: Four times a day (QID) | RESPIRATORY_TRACT | Status: DC
  Administered 2016-11-01 – 2016-11-02 (×8): 3 mL via RESPIRATORY_TRACT
  Filled 2016-11-01 (×8): qty 3

## 2016-11-01 MED ORDER — BISACODYL 10 MG RE SUPP
10.0000 mg | Freq: Every day | RECTAL | Status: DC | PRN
  Administered 2016-11-02: 10 mg via RECTAL
  Filled 2016-11-01: qty 1

## 2016-11-01 MED ORDER — METOPROLOL SUCCINATE ER 25 MG PO TB24
12.5000 mg | ORAL_TABLET | Freq: Every day | ORAL | Status: DC
  Administered 2016-11-02 – 2016-11-03 (×2): 12.5 mg via ORAL
  Filled 2016-11-01 (×2): qty 1

## 2016-11-01 MED ORDER — POTASSIUM CHLORIDE CRYS ER 20 MEQ PO TBCR
20.0000 meq | EXTENDED_RELEASE_TABLET | Freq: Two times a day (BID) | ORAL | Status: DC
  Administered 2016-11-01 – 2016-11-02 (×4): 20 meq via ORAL
  Filled 2016-11-01 (×4): qty 1

## 2016-11-01 MED ORDER — ALBUMIN HUMAN 25 % IV SOLN
25.0000 g | Freq: Once | INTRAVENOUS | Status: AC
  Administered 2016-11-01: 25 g via INTRAVENOUS
  Filled 2016-11-01: qty 100

## 2016-11-01 MED ORDER — POTASSIUM CHLORIDE CRYS ER 20 MEQ PO TBCR
40.0000 meq | EXTENDED_RELEASE_TABLET | Freq: Once | ORAL | Status: AC
  Administered 2016-11-01: 40 meq via ORAL
  Filled 2016-11-01: qty 2

## 2016-11-01 NOTE — Progress Notes (Signed)
[facility] Progress Note  Patient: William Braun [id number]   PCP: Leeroy Cha, MD DOB: 1933-08-29   DOA: 10/29/2016   DOS: 11/01/2016   Date of Service: the patient was seen and examined on 11/01/2016   Subjective: Feeling better, no confusion. Improvement in shortness of breath.  Brief hospital course: Pt. with PMH of CHF, HTN, type II DM, A. fib on Coumadin; admitted on 10/29/2016, with complaint of infusion and shortness of breath, was found to have UTI. Currently further plan is continue antibiotics and monitor Respiratory function.  Assessment and Plan:  1 Acute encephalopathy- UTI, Resolved Patient presented with confusion, which family states is different than his baseline mental status. Elevated BUN and other metabolic disturbances such as hypokalemia are likely contributing.  Patient's urinalysis also raises the suspicion for urinary tract infection,  Empirically treat for complicated urinary tract infection. Changing antibiotics to oral Keflex Blood cultures negative To date, urine culture no significant growth  2 acute on chronic hypoxic respiratory failure. Acute on chronic diastolic CHF. UnSpecified pulmonary hypertension History of COPD on 3 L of oxygen at home, OSA on C Pap Given aggressive volume resuscitation in the emergency department,  Respiratory distress improved after Lasix, renal function also improved. Continue IV Lasix continue albumin for today. BiPAP when necessary. ABG shows primary respiratory alkalosis with respiratory acidosis. C Pap daily at bedtime. Chest x-ray shows mild vascular congestion examination shows basal crackles. Changed to DuoNeb's, does not appear to have any COPD exacerbation at present.  3 Diabetes mellitus type 2 Patient takes Lantus at home. Will continue Lantus and add sliding scale insulin for glycemic control. Hemoglobin A1c 7.7, uncontrolled.  4. Leukocytosis. Improving History of CLL. Patient presented  with leukocytosis concerning for severe sepsis although patient has history of CLL, follows up with oncology,. We will check IgG levels.  5.Atrial fibrillation Patient has a long-standing history of atrial fibrillation with elevated chadsVas score. He takes warfarin at home. Rate controlled. Resuming low-dose 12.5 mg Toprol daily.  5. Chronic kidney disease stage IV. Hypokalemia Mild worsening on admission, received IV fluids. Chronic elevation of BUN with acute worsening suspected initially from dehydration. Renal function better with IV Lasix. Replace potassium.  6. Lactic acidosis. Etiology unclear suspected sepsis on admission patient received IV fluids. Lactic acid values normal this morning. We'll monitor.  Bowel regimen: last BM 10/28/2016, bowel regimen initiated Diet: cardiac diet DVT Prophylaxis: Warfarin  Advance goals of care discussion: full code  Family Communication: no family was present at bedside, at the time of interview.  Disposition:  Discharge to SNF. Social worker consulted Expected discharge date: 11/04/2016, stabilization of respiratory status and renal function  Consultants: none Procedures: BiPAP  Antibiotics: Anti-infectives    Start     Dose/Rate Route Frequency Ordered Stop   10/31/16 1700  piperacillin-tazobactam (ZOSYN) IVPB 3.375 g  Status:  Discontinued     3.375 g 12.5 mL/hr over 240 Minutes Intravenous Every 8 hours 10/31/16 1019 10/31/16 1109   10/31/16 1400  cephALEXin (KEFLEX) capsule 500 mg  Status:  Discontinued     500 mg Oral Every 8 hours 10/31/16 1109 10/31/16 1119   10/31/16 1130  cephALEXin (KEFLEX) capsule 500 mg     500 mg Oral Every 12 hours 10/31/16 1119     10/31/16 1100  piperacillin-tazobactam (ZOSYN) IVPB 3.375 g  Status:  Discontinued     3.375 g 100 mL/hr over 30 Minutes Intravenous  Once 10/31/16 1019 10/31/16 1109   10/30/16 2200  cefTRIAXone (  ROCEPHIN) 2 g in dextrose 5 % 50 mL IVPB  Status:  Discontinued      2 g 100 mL/hr over 30 Minutes Intravenous Every 24 hours 10/30/16 0219 10/31/16 1006   10/30/16 0230  cefTRIAXone (ROCEPHIN) 1 g in dextrose 5 % 50 mL IVPB  Status:  Discontinued     1 g 100 mL/hr over 30 Minutes Intravenous  Once 10/30/16 0219 10/30/16 0229   10/29/16 2145  cefTRIAXone (ROCEPHIN) 1 g in dextrose 5 % 50 mL IVPB     1 g 100 mL/hr over 30 Minutes Intravenous  Once 10/29/16 2130 10/29/16 2227      Objective: Physical Exam: Vitals:   11/01/16 0726 11/01/16 0919 11/01/16 1100 11/01/16 1303  BP: (!) 141/99  (!) 118/104   Pulse: 93  96   Resp: (!) 29  (!) 24   Temp: 97.4 F (36.3 C)  97.6 F (36.4 C)   TempSrc: Oral  Oral   SpO2: 93% 95% 93% 93%    Intake/Output Summary (Last 24 hours) at 11/01/16 1359 Last data filed at 11/01/16 0900  Gross per 24 hour  Intake             1020 ml  Output             2525 ml  Net            -1505 ml   There were no vitals filed for this visit.  General: Alert, Awake and Oriented to Time, Place and Person. Appear in moderate distress, Eyes: PERRL, Conjunctiva normal ENT: Oral Mucosa clear moist. Neck: no JVD, no Abnormal Mass Or lumps Cardiovascular: S1 and S2 Present, no Murmur, Respiratory: Bilateral Air entry equal and Decreased, no use of accessory muscle, basal Crackles, no wheezes Abdomen: Bowel Sound present, Soft and no tenderness Skin: no redness, no Rash, no induration Extremities: trace Pedal edema, no calf tenderness Neurologic: Grossly no focal neuro deficit. Bilaterally Equal motor strength  Data Reviewed: CBC:  Recent Labs Lab 10/29/16 2038 10/30/16 0238 10/31/16 0226 11/01/16 0224  WBC 38.6* 34.1* 35.5* 25.9*  NEUTROABS 16.6*  --  17.8* 11.1*  HGB 16.6 13.4 13.4 13.1  HCT 47.2 39.5 40.0 40.4  MCV 89.6 89.8 91.1 93.1  PLT 130* 128* 155 [id number]*   Basic Metabolic Panel:  Recent Labs Lab 10/29/16 2038 10/30/16 0238 10/31/16 0226 10/31/16 1819 11/01/16 0224  NA 134* 134* 141 144 142  K 2.8* 3.8  3.3* 3.1* 3.1*  CL 76* 83* 92* 94* 90*  CO2 40* 33* 35* 39* 40*  GLUCOSE 188* 376* 157* 214* 151*  BUN 106* 105* 94* 79* 70*  CREATININE 2.23* 2.21* 1.84* 1.60* 1.55*  CALCIUM 9.7 8.5* 8.7* 9.1 9.3  MG  --  1.9 2.2  --  2.4  PHOS  --  4.6  --   --   --     Liver Function Tests:  Recent Labs Lab 10/29/16 2038 10/30/16 0238 10/31/16 0226 11/01/16 0224  AST 32 31 26 22   ALT 18 18 17  16*  ALKPHOS 79 61 60 57  BILITOT 1.6* 1.3* 0.8 0.9  PROT 7.6 6.2* 6.5 6.4*  ALBUMIN 3.2* 2.5* 2.7* 2.9*   No results for input(s): LIPASE, AMYLASE in the last 168 hours.  Recent Labs Lab 10/30/16 0903  AMMONIA 19   Coagulation Profile:  Recent Labs Lab 10/29/16 2038 10/30/16 0238 10/31/16 0226 11/01/16 0224  INR 1.57 1.89 1.98 2.28   Cardiac Enzymes: No results for input(s): CKTOTAL,  CKMB, CKMBINDEX, TROPONINI in the last 168 hours. BNP (last 3 results) No results for input(s): PROBNP in the last 8760 hours.  CBG:  Recent Labs Lab 10/31/16 1126 10/31/16 1647 10/31/16 2154 11/01/16 0751 11/01/16 1206  GLUCAP 129* 205* 240* 130* 151*    Studies: No results found.   Scheduled Meds: . albumin human  25 g Intravenous Once  . cephALEXin  500 mg Oral Q12H  . chlorhexidine  15 mL Mouth Rinse BID  . furosemide  40 mg Intravenous BID  . insulin aspart  0-15 Units Subcutaneous TID WC  . insulin aspart  0-5 Units Subcutaneous QHS  . insulin glargine  10 Units Subcutaneous Q2200  . ipratropium-albuterol  3 mL Nebulization QID  . lidocaine  1 patch Transdermal Q24H  . mouth rinse  15 mL Mouth Rinse q12n4p  . metoprolol succinate  12.5 mg Oral Daily  . mometasone-formoterol  2 puff Inhalation BID  . polyethylene glycol  17 g Oral Daily  . potassium chloride  20 mEq Oral BID  . senna-docusate  1 tablet Oral BID  . simvastatin  20 mg Oral q1800  . warfarin  2.5 mg Oral q1800  . Warfarin - Physician Dosing Inpatient   Does not apply q1800   Continuous Infusions:  PRN Meds:  albuterol, ALPRAZolam, alum & mag hydroxide-simeth, bisacodyl, ondansetron  Time spent: 30 minutes  Author: Berle Mull, MD [facility] Pager: [phone] 11/01/2016 1:59 PM  If 7PM-7AM, please contact night-coverage at [email or web address], password [facility]

## 2016-11-01 NOTE — Evaluation (Signed)
Physical Therapy Evaluation Patient Details Name: STRUMMER POLMAN MRN: [id number] DOB: June 15, 1933 Today's Date: 11/01/2016   History of Present Illness  pt presents with complicated UTI, Sepsis,  COPD Exacerbation.  pt with hx of COPD, Chronic Respiratory Failure, OSA, HTN, DM, CLL, CHF, A-fib, CVA, and A-fib.    Clinical Impression   Pt admitted with above diagnosis. Pt currently with functional limitations due to the deficits listed below (see PT Problem List). Presents with generalized weakness and decr functional capacity; Pt's HR tends to incr rapidly during any activity (bed mobility and transfers); noted 156 bpm at the highest; Desatted as well with activity, lowest observed O2 saturation 83%; recovers to HR in the 100s and O2 sats 88-92% with 3-5 minutes of seated rest;  Pt will benefit from skilled PT to increase their independence and safety with mobility to allow discharge to the venue listed below.       Follow Up Recommendations SNF    Equipment Recommendations  None recommended by PT    Recommendations for Other Services OT consult     Precautions / Restrictions Precautions Precautions: Other (comment) Precaution Comments: Desats with activity; very tachycardic with activity Restrictions Weight Bearing Restrictions: No      Mobility  Bed Mobility Overal bed mobility: Needs Assistance Bed Mobility: Supine to Sit     Supine to sit: Min assist     General bed mobility comments: Min assist squaring off hips at EOB; HR incr to 143, O2 sats drop to 85% with effort -- on 5 L supplemental O2  Transfers Overall transfer level: Needs assistance   Transfers: Sit to/from Stand;Stand Pivot Transfers Sit to Stand: Min guard Stand pivot transfers: Min guard       General transfer comment: Stood from elevated bed, heavy dependence on UEs to push off and for steadiness; did not acheive fully upright standing; pivot steps to turn and sit into recliner positioned directly in  front of pt (they way he tends to set up his power chair for transfers at home); very tachy cardic during transfer and O2 sats dropped  Ambulation/Gait                Stairs            Wheelchair Mobility    Modified Rankin (Stroke Patients Only)       Balance                                             Pertinent Vitals/Pain Pain Assessment: No/denies pain    Home Living Family/patient expects to be discharged to:: Private residence Living Arrangements: Spouse/significant other Available Help at Discharge: Family;Available 24 hours/day Type of Home: House Home Access: Ramped entrance     Home Layout: One level Home Equipment: Shower seat;Wheelchair - power      Prior Function Level of Independence: Needs assistance   Gait / Transfers Assistance Needed: pt transfer Mod I to/from scooter only.   Bathroom is not accessible to his scooter so he parks in front of door and holds onto fixtures in the bathroom and ambulates 3-5''    ADL's / Homemaking Assistance Needed: wife assists pt with bathing and dressing feet as well as bathing his back.  She performs IADLs         Hand Dominance        Extremity/Trunk Assessment  Upper Extremity Assessment Upper Extremity Assessment: Generalized weakness    Lower Extremity Assessment Lower Extremity Assessment: Generalized weakness       Communication   Communication: No difficulties  Cognition Arousal/Alertness: Awake/alert Behavior During Therapy: WFL for tasks assessed/performed Overall Cognitive Status: Within Functional Limits for tasks assessed                      General Comments General comments (skin integrity, edema, etc.): Pt's HR tends to incr rapidly during any activity (bed mobility and transfers); noted 156 bpm at the highest; Desatted as well with activity, lowest observed O2 saturation 83%; recovers to HR in the 100s and O2 sats 88-92% with 3-5 minutes of seated  rest    Exercises     Assessment/Plan    PT Assessment Patient needs continued PT services  PT Problem List Decreased strength;Decreased activity tolerance;Decreased balance;Decreased mobility;Decreased knowledge of use of DME;Decreased safety awareness (Decr functional capacity)          PT Treatment Interventions DME instruction;Gait training;Functional mobility training;Therapeutic activities;Therapeutic exercise;Balance training;Patient/family education    PT Goals (Current goals can be found in the Care Plan section)  Acute Rehab PT Goals Patient Stated Goal: Wants to ge better and be home PT Goal Formulation: With patient Time For Goal Achievement: 11/15/16 Potential to Achieve Goals: Fair    Frequency Min 3X/week   Barriers to discharge        Co-evaluation               End of Session Equipment Utilized During Treatment: Gait belt;Oxygen Activity Tolerance: Patient limited by fatigue Patient left: in chair;with call bell/phone within reach;with nursing/sitter in room Nurse Communication: Mobility status         Time: 1016-1040 PT Time Calculation (min) (ACUTE ONLY): 24 min   Charges:   PT Evaluation $PT Eval Moderate Complexity: 1 Procedure PT Treatments $Therapeutic Activity: 8-22 mins   PT G Codes:        Colletta [place] 11/01/2016, 12:24 PM  Roney Marion, PT  Acute Rehabilitation Services Pager [phone] Office [phone]

## 2016-11-01 NOTE — Progress Notes (Signed)
On call Baltazar Najjar NP notified of 5 beat run of William Braun. Pt asymptomatic

## 2016-11-02 ENCOUNTER — Encounter (HOSPITAL_COMMUNITY): Payer: PPO

## 2016-11-02 LAB — IGG, IGA, IGM
IGA: 271 mg/dL (ref 61–437)
IGM, SERUM: 53 mg/dL (ref 15–143)
IgG (Immunoglobin G), Serum: 1079 mg/dL (ref 700–1600)

## 2016-11-02 LAB — BASIC METABOLIC PANEL
Anion gap: 10 (ref 5–15)
BUN: 49 mg/dL — ABNORMAL HIGH (ref 6–20)
CO2: 43 mmol/L — AB (ref 22–32)
CREATININE: 1.38 mg/dL — AB (ref 0.61–1.24)
Calcium: 9.2 mg/dL (ref 8.9–10.3)
Chloride: 90 mmol/L — ABNORMAL LOW (ref 101–111)
GFR calc non Af Amer: 46 mL/min — ABNORMAL LOW (ref >60)
GFR, EST AFRICAN AMERICAN: 54 mL/min — AB (ref >60)
Glucose, Bld: 138 mg/dL — ABNORMAL HIGH (ref 65–99)
Potassium: 3.6 mmol/L (ref 3.5–5.1)
Sodium: 143 mmol/L (ref 135–145)

## 2016-11-02 LAB — PROTIME-INR
INR: 2.85
PROTHROMBIN TIME: 30.5 s — AB (ref 11.4–15.2)

## 2016-11-02 LAB — GLUCOSE, CAPILLARY
GLUCOSE-CAPILLARY: 92 mg/dL (ref 65–99)
GLUCOSE-CAPILLARY: 196 mg/dL — AB (ref 65–99)
GLUCOSE-CAPILLARY: 258 mg/dL — AB (ref 65–99)
Glucose-Capillary: 89 mg/dL (ref 65–99)

## 2016-11-02 LAB — CBC
HEMATOCRIT: 38.3 % — AB (ref 39.0–52.0)
HEMOGLOBIN: 12.8 g/dL — AB (ref 13.0–17.0)
MCH: 31.1 pg (ref 26.0–34.0)
MCHC: 33.4 g/dL (ref 30.0–36.0)
MCV: 93.2 fL (ref 78.0–100.0)
Platelets: 105 10*3/uL — ABNORMAL LOW (ref 150–400)
RBC: 4.11 MIL/uL — ABNORMAL LOW (ref 4.22–5.81)
RDW: 18.2 % — ABNORMAL HIGH (ref 11.5–15.5)
WBC: 21.4 10*3/uL — ABNORMAL HIGH (ref 4.0–10.5)

## 2016-11-02 LAB — MAGNESIUM: Magnesium: 2.1 mg/dL (ref 1.7–2.4)

## 2016-11-02 MED ORDER — WARFARIN SODIUM 2.5 MG PO TABS
1.2500 mg | ORAL_TABLET | Freq: Once | ORAL | Status: AC
  Administered 2016-11-02: 1.25 mg via ORAL
  Filled 2016-11-02: qty 0.5

## 2016-11-02 MED ORDER — WARFARIN - PHARMACIST DOSING INPATIENT
Freq: Every day | Status: DC
  Administered 2016-11-02 – 2016-11-04 (×3)

## 2016-11-02 MED ORDER — CEPHALEXIN 500 MG PO CAPS
500.0000 mg | ORAL_CAPSULE | Freq: Three times a day (TID) | ORAL | Status: AC
  Administered 2016-11-02 – 2016-11-05 (×10): 500 mg via ORAL
  Filled 2016-11-02 (×10): qty 1

## 2016-11-02 NOTE — NC FL2 (Signed)
[place] LEVEL OF CARE SCREENING TOOL     IDENTIFICATION  Patient Name: William Braun Birthdate: 1933/01/09 Sex: male Admission Date (Current Location): 10/29/2016  [facility] and [place] Number:  Herbalist and Address:  [facility], [place] [address], [place], [place] [address]      Provider Number: [id number]  Attending Physician Name and Address:  Lavina Hamman, MD  Relative Name and Phone Number:  Bonnita Nasuti, spouse, [phone]    Current Level of Care: Hospital Recommended Level of Care: [place] Prior Approval Number:    Date Approved/Denied:   PASRR Number: [id number]  Discharge Plan: SNF    Current Diagnoses: Patient Active Problem List   Diagnosis Date Noted  . Acute on chronic diastolic CHF (congestive heart failure) ([place]) 10/31/2016  . Pressure injury of skin 10/30/2016  . Complicated UTI (urinary tract infection) 10/29/2016  . Acute on chronic respiratory failure with hypoxia ([place]) 09/14/2016  . Elevated troponin 09/14/2016  . Hypokalemia 09/14/2016  . Acute renal failure superimposed on stage 3 chronic kidney disease ([place]) 09/14/2016  . Toe ulcer, right ([place]) 02/02/2016  . Type II diabetes mellitus with neurological manifestations ([place]) 02/02/2016  . CLL (chronic lymphocytic leukemia) ([place]) 11/17/2014  . Elevated WBCs 04/23/2014  . Other emphysema ([place]) 09/27/2013  . Dyspnea 08/13/2013  . 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 Chest pain, unspecified 08/03/2011  . Chronic diastolic CHF (congestive heart failure) ([place]) 05/11/2011  . Obesity hypoventilation syndrome ([place]) 03/12/2010  . CONGESTIVE HEART FAILURE, BIVENTRICULAR DYSFUNCTION 03/12/2010  . COPD exacerbation ([place]) 03/12/2010  . Chronic respiratory failure ([place]) 02/19/2010  . HLD (hyperlipidemia) 12/03/2008  . Morbid obesity ([place]) 12/03/2008  . Essential hypertension 12/03/2008  . ATRIAL FIBRILLATION 12/03/2008  . PERIPHERAL EDEMA  12/03/2008    Orientation RESPIRATION BLADDER Height & Weight     Self, Time, Situation, Place  O2, Other (Comment) (Nasal cannula 4L; BiPap 12/6) Continent Weight:   Height:     BEHAVIORAL SYMPTOMS/MOOD NEUROLOGICAL BOWEL NUTRITION STATUS      Continent Diet (Please see DC Summary)  AMBULATORY STATUS COMMUNICATION OF NEEDS Skin   Limited Assist Verbally PU Stage and Appropriate Care (Pressure Injury Stage I on Buttocks)                       Personal Care Assistance Level of Assistance  Bathing, Feeding, Dressing Bathing Assistance: Maximum assistance Feeding assistance: Independent Dressing Assistance: Limited assistance     Functional Limitations Info             SPECIAL CARE FACTORS FREQUENCY  PT (By licensed PT)     PT Frequency: 5x/week              Contractures      Additional Factors Info  Code Status, Allergies, Insulin Sliding Scale Code Status Info: Full Allergies Info: Quinine   Insulin Sliding Scale Info: 3 times daily with meals and at bedtime       Current Medications (11/02/2016):  This is the current hospital active medication list Current Facility-Administered Medications  Medication Dose Route Frequency Provider Last Rate Last Dose  . albuterol (PROVENTIL) (2.5 MG/3ML) 0.083% nebulizer solution 2.5 mg  2.5 mg Nebulization Q6H PRN Lavina Hamman, MD   2.5 mg at 10/31/16 0829  . ALPRAZolam Duanne Moron) tablet 0.125 mg  0.125 mg Oral BID PRN Lavina Hamman, MD   0.125 mg at 11/01/16 1408  . alum & mag hydroxide-simeth (  MAALOX/MYLANTA) 200-200-20 MG/5ML suspension 30 mL  30 mL Oral PRN Lavina Hamman, MD   30 mL at 10/30/16 2209  . bisacodyl (DULCOLAX) suppository 10 mg  10 mg Rectal Daily PRN Lavina Hamman, MD      . cephALEXin [facility]) capsule 500 mg  500 mg Oral Q12H Kris Mouton, RPH   500 mg at 11/01/16 2241  . chlorhexidine (PERIDEX) 0.12 % solution 15 mL  15 mL Mouth Rinse BID Lavina Hamman, MD   15 mL at 11/01/16 2241  . furosemide  (LASIX) injection 40 mg  40 mg Intravenous BID Lavina Hamman, MD   40 mg at 11/02/16 0834  . insulin aspart (novoLOG) injection 0-15 Units  0-15 Units Subcutaneous TID WC Lavina Hamman, MD   5 Units at 11/01/16 1709  . insulin aspart (novoLOG) injection 0-5 Units  0-5 Units Subcutaneous QHS Lavina Hamman, MD   2 Units at 10/31/16 2209  . insulin glargine (LANTUS) injection 10 Units  10 Units Subcutaneous Q2200 Rexanne Mano, MD   10 Units at 11/01/16 2241  . ipratropium-albuterol (DUONEB) 0.5-2.5 (3) MG/3ML nebulizer solution 3 mL  3 mL Nebulization QID Lavina Hamman, MD   3 mL at 11/02/16 0818  . lidocaine (LIDODERM) 5 % 1 patch  1 patch Transdermal Q24H Rexanne Mano, MD   1 patch at 11/01/16 2241  . MEDLINE mouth rinse  15 mL Mouth Rinse q12n4p Lavina Hamman, MD      . metoprolol succinate (TOPROL-XL) 24 hr tablet 12.5 mg  12.5 mg Oral Daily Lavina Hamman, MD      . mometasone-formoterol [facility]) 200-5 MCG/ACT inhaler 2 puff  2 puff Inhalation BID Rexanne Mano, MD   2 puff at 11/02/16 0818  . ondansetron (ZOFRAN) injection 4 mg  4 mg Intravenous Q6H PRN Jeryl Columbia, NP   4 mg at 10/30/16 2117  . polyethylene glycol (MIRALAX / GLYCOLAX) packet 17 g  17 g Oral Daily Lavina Hamman, MD   17 g at 11/01/16 [id number]  . potassium chloride SA (K-DUR,KLOR-CON) CR tablet 20 mEq  20 mEq Oral BID Lavina Hamman, MD   20 mEq at 11/01/16 2241  . senna-docusate (Senokot-S) tablet 1 tablet  1 tablet Oral BID Lavina Hamman, MD   1 tablet at 11/01/16 2241  . simvastatin (ZOCOR) tablet 20 mg  20 mg Oral q1800 Rexanne Mano, MD   20 mg at 11/01/16 1708  . warfarin (COUMADIN) tablet 2.5 mg  2.5 mg Oral q1800 Rexanne Mano, MD   2.5 mg at 11/01/16 1709  . Warfarin - Physician Dosing Inpatient   Does not apply [place], [facility]         Discharge Medications: Please see discharge summary for a list of discharge medications.  Relevant Imaging Results:  Relevant Lab  Results:   Additional Information SSN: [place] Benjamin Perez, [place]

## 2016-11-02 NOTE — Progress Notes (Signed)
Placed pt. On bipap. Pt. Breathing was starting to become labored. Pt. It tolerating the bipap well at this time.

## 2016-11-02 NOTE — Progress Notes (Signed)
[facility] Progress Note  Patient: William Braun [id number]   PCP: Leeroy Cha, MD DOB: Apr 19, 1933   DOA: 10/29/2016   DOS: 11/02/2016   Date of Service: the patient was seen and examined on 11/02/2016   Subjective: Feeling better, no confusion. Wife at bedside complains about on and off confusion.   Brief hospital course: Pt. with PMH of CHF, HTN, type II DM, A. fib on Coumadin; admitted on 10/29/2016, with complaint of infusion and shortness of breath, was found to have UTI. Currently further plan is continue antibiotics and monitor Respiratory function.  Assessment and Plan:  1 Acute encephalopathy- UTI, Resolved Patient presented with confusion, which family states is different than his baseline mental status. Elevated BUN and other metabolic disturbances such as hypokalemia are likely contributing.  Patient's urinalysis also raises the suspicion for urinary tract infection,  Empirically treat for complicated urinary tract infection. Changing antibiotics to oral Keflex, finish 7 days course. Last day 11/05/2016 Blood cultures negative To date, urine culture no significant growth  2 acute on chronic hypoxic respiratory failure. Acute on chronic diastolic CHF. UnSpecified pulmonary hypertension History of COPD on 3 L of oxygen at home, OSA on C Pap Given aggressive volume resuscitation in the emergency department,  Respiratory distress improved after Lasix, renal function also improved. Continue IV Lasix. BiPAP when necessary. ABG on admission shows primary respiratory alkalosis with respiratory acidosis. C Pap daily at bedtime. Chest x-ray shows mild vascular congestion examination shows basal crackles. Changed to DuoNeb's, does not appear to have any COPD exacerbation at present. On 5 LPM, oxygenation improves to 93-94%on 4.5LPM when he takes deeps breath. Continue incentive spirometry.  3 Diabetes mellitus type 2 Patient takes Lantus at home. Will continue  Lantus and add sliding scale insulin for glycemic control. Hemoglobin A1c 7.7, uncontrolled.  4. Leukocytosis. Improving History of CLL. Patient presented with leukocytosis concerning for severe sepsis although patient has history of CLL, follows up with oncology, notified oncology of admission. Normal IgG levels.  5.Atrial fibrillation Patient has a long-standing history of atrial fibrillation with elevated chadsVas score. He takes warfarin at home. Rate controlled. Resuming low-dose 12.5 mg Toprol daily.  5. Chronic kidney disease stage IV. Hypokalemia Back to baseline levels. Mild worsening on admission, received IV fluids. Chronic elevation of BUN with acute worsening suspected initially from dehydration. Renal function better with IV Lasix. Replace potassium.  6. Lactic acidosis. Etiology unclear suspected sepsis on admission patient received IV fluids. Lactic acid values normal this morning. We'll monitor.  Bowel regimen: last BM 10/30/2016, bowel regimen initiated Diet: cardiac diet DVT Prophylaxis: Warfarin  Advance goals of care discussion: full code  Family Communication: family was present at bedside, at the time of interview.  Disposition:  Discharge to SNF. Social worker consulted Expected discharge date: 11/05/2016, stabilization of respiratory status and renal function  Consultants: none Procedures: BiPAP  Antibiotics: Anti-infectives    Start     Dose/Rate Route Frequency Ordered Stop   11/02/16 2200  cephALEXin (KEFLEX) capsule 500 mg     500 mg Oral Every 8 hours 11/02/16 1041     10/31/16 1700  piperacillin-tazobactam (ZOSYN) IVPB 3.375 g  Status:  Discontinued     3.375 g 12.5 mL/hr over 240 Minutes Intravenous Every 8 hours 10/31/16 1019 10/31/16 1109   10/31/16 1400  cephALEXin (KEFLEX) capsule 500 mg  Status:  Discontinued     500 mg Oral Every 8 hours 10/31/16 1109 10/31/16 1119   10/31/16 1130  cephALEXin (KEFLEX)  capsule 500 mg  Status:   Discontinued     500 mg Oral Every 12 hours 10/31/16 1119 11/02/16 1041   10/31/16 1100  piperacillin-tazobactam (ZOSYN) IVPB 3.375 g  Status:  Discontinued     3.375 g 100 mL/hr over 30 Minutes Intravenous  Once 10/31/16 1019 10/31/16 1109   10/30/16 2200  cefTRIAXone (ROCEPHIN) 2 g in dextrose 5 % 50 mL IVPB  Status:  Discontinued     2 g 100 mL/hr over 30 Minutes Intravenous Every 24 hours 10/30/16 0219 10/31/16 1006   10/30/16 0230  cefTRIAXone (ROCEPHIN) 1 g in dextrose 5 % 50 mL IVPB  Status:  Discontinued     1 g 100 mL/hr over 30 Minutes Intravenous  Once 10/30/16 0219 10/30/16 0229   10/29/16 2145  cefTRIAXone (ROCEPHIN) 1 g in dextrose 5 % 50 mL IVPB     1 g 100 mL/hr over 30 Minutes Intravenous  Once 10/29/16 2130 10/29/16 2227      Objective: Physical Exam: Vitals:   11/02/16 1150 11/02/16 1200 11/02/16 1229 11/02/16 1537  BP: 108/90     Pulse:  93    Resp: (!) 35 (!) 27    Temp:      TempSrc:      SpO2:  92% 96% 92%    Intake/Output Summary (Last 24 hours) at 11/02/16 1605 Last data filed at 11/02/16 1200  Gross per 24 hour  Intake              960 ml  Output             1550 ml  Net             -590 ml   There were no vitals filed for this visit.  General: Alert, Awake and Oriented to Time, Place and Person. Appear in moderate distress, Eyes: PERRL, Conjunctiva normal ENT: Oral Mucosa clear moist. Neck: no JVD, no Abnormal Mass Or lumps Cardiovascular: S1 and S2 Present, no Murmur, Respiratory: Bilateral Air entry equal and Decreased, no use of accessory muscle, basal Crackles, no wheezes Abdomen: Bowel Sound present, Soft and no tenderness Skin: no redness, no Rash, no induration Extremities: trace Pedal edema, no calf tenderness Neurologic: Grossly no focal neuro deficit. Bilaterally Equal motor strength  Data Reviewed: CBC:  Recent Labs Lab 10/29/16 2038 10/30/16 0238 10/31/16 0226 11/01/16 0224 11/02/16 0222  WBC 38.6* 34.1* 35.5* 25.9*  21.4*  NEUTROABS 16.6*  --  17.8* 11.1*  --   HGB 16.6 13.4 13.4 13.1 12.8*  HCT 47.2 39.5 40.0 40.4 38.3*  MCV 89.6 89.8 91.1 93.1 93.2  PLT 130* 128* 155 135* 123456*   Basic Metabolic Panel:  Recent Labs Lab 10/30/16 0238 10/31/16 0226 10/31/16 1819 11/01/16 0224 11/02/16 0222  NA 134* 141 144 142 143  K 3.8 3.3* 3.1* 3.1* 3.6  CL 83* 92* 94* 90* 90*  CO2 33* 35* 39* 40* 43*  GLUCOSE 376* 157* 214* 151* 138*  BUN 105* 94* 79* 70* 49*  CREATININE 2.21* 1.84* 1.60* 1.55* 1.38*  CALCIUM 8.5* 8.7* 9.1 9.3 9.2  MG 1.9 2.2  --  2.4 2.1  PHOS 4.6  --   --   --   --     Liver Function Tests:  Recent Labs Lab 10/29/16 2038 10/30/16 0238 10/31/16 0226 11/01/16 0224  AST 32 31 26 22   ALT 18 18 17  16*  ALKPHOS 79 61 60 57  BILITOT 1.6* 1.3* 0.8 0.9  PROT 7.6 6.2*  6.5 6.4*  ALBUMIN 3.2* 2.5* 2.7* 2.9*   No results for input(s): LIPASE, AMYLASE in the last 168 hours.  Recent Labs Lab 10/30/16 0903  AMMONIA 19   Coagulation Profile:  Recent Labs Lab 10/29/16 2038 10/30/16 0238 10/31/16 0226 11/01/16 0224 11/02/16 0222  INR 1.57 1.89 1.98 2.28 2.85   Cardiac Enzymes: No results for input(s): CKTOTAL, CKMB, CKMBINDEX, TROPONINI in the last 168 hours. BNP (last 3 results) No results for input(s): PROBNP in the last 8760 hours.  CBG:  Recent Labs Lab 11/01/16 1206 11/01/16 1621 11/01/16 2213 11/02/16 0755 11/02/16 1139  GLUCAP 151* 205* 145* 92 196*    Studies: No results found.   Scheduled Meds: . cephALEXin  500 mg Oral Q8H  . chlorhexidine  15 mL Mouth Rinse BID  . furosemide  40 mg Intravenous BID  . insulin aspart  0-15 Units Subcutaneous TID WC  . insulin aspart  0-5 Units Subcutaneous QHS  . insulin glargine  10 Units Subcutaneous Q2200  . ipratropium-albuterol  3 mL Nebulization QID  . lidocaine  1 patch Transdermal Q24H  . mouth rinse  15 mL Mouth Rinse q12n4p  . metoprolol succinate  12.5 mg Oral Daily  . mometasone-formoterol  2 puff  Inhalation BID  . polyethylene glycol  17 g Oral Daily  . potassium chloride  20 mEq Oral BID  . senna-docusate  1 tablet Oral BID  . simvastatin  20 mg Oral q1800  . warfarin  1.25 mg Oral ONCE-1800  . Warfarin - Pharmacist Dosing Inpatient   Does not apply q1800   Continuous Infusions:  PRN Meds: albuterol, ALPRAZolam, alum & mag hydroxide-simeth, bisacodyl, ondansetron  Time spent: 30 minutes  Author: Berle Mull, MD [facility] Pager: [phone] 11/02/2016 4:05 PM  If 7PM-7AM, please contact night-coverage at [email or web address], password [facility]

## 2016-11-02 NOTE — Progress Notes (Signed)
ANTICOAGULATION CONSULT NOTE - Initial Consult  Pharmacy Consult for coumadin Indication: atrial fibrillation  Allergies  Allergen Reactions  . Quinine Palpitations and Other (See Comments)    Seizure      Vital Signs: Temp: 97.2 F (36.2 C) (02/14 1136) Temp Source: Oral (02/14 1136) BP: 78/55 (02/14 1136) Pulse Rate: 90 (02/14 1136)  Labs:  Recent Labs  10/31/16 0226 10/31/16 1819 11/01/16 0224 11/02/16 0222  HGB 13.4  --  13.1 12.8*  HCT 40.0  --  40.4 38.3*  PLT 155  --  135* 105*  LABPROT 22.8*  --  25.5* 30.5*  INR 1.98  --  2.28 2.85  CREATININE 1.84* 1.60* 1.55* 1.38*    Estimated Creatinine Clearance: 57.3 mL/min (by C-G formula based on SCr of 1.38 mg/dL (H)).   Medical History: Past Medical History:  Diagnosis Date  . Atrial fibrillation ([place])   . Cerebrovascular disease   . CHF (congestive heart failure) ([place])   . CLL (chronic lymphocytic leukemia) ([place]) 11/17/2014  . COPD (chronic obstructive pulmonary disease) ([place])   . History of thrombocytopenia   . Obesity   . Osteoarthritis   . Other and unspecified hyperlipidemia   . Type II or unspecified type diabetes mellitus without mention of complication, not stated as uncontrolled   . Unspecified essential hypertension     Medications:  Prescriptions Prior to Admission  Medication Sig Dispense Refill Last Dose  . albuterol (VENTOLIN HFA) 108 (90 BASE) MCG/ACT inhaler Inhale 2 puffs into the lungs every 6 (six) hours as needed for wheezing or shortness of breath. 3 Inhaler 1 Past Month at Unknown time  . budesonide-formoterol (SYMBICORT) 160-4.5 MCG/ACT inhaler Inhale 2 puffs into the lungs 2 (two) times daily. 2 puffs twice daily 3 Inhaler 3 10/29/2016 at Unknown time  . ciprofloxacin (CIPRO) 250 MG tablet Take 250 mg by mouth 2 (two) times daily.   10/29/2016 at AM  . glimepiride (AMARYL) 4 MG tablet Take 8 mg by mouth daily with breakfast.   10/29/2016 at Unknown time  . HYDROcodone-acetaminophen  (NORCO/VICODIN) 5-325 MG tablet Take 1 tablet by mouth every 6 (six) hours as needed for moderate pain.   10/28/2016 at Unknown time  . Insulin Glargine (LANTUS) 100 UNIT/ML Solostar Pen Inject 20 Units into the skin daily at 10 pm. 15 mL 11 10/28/2016 at Unknown time  . ipratropium-albuterol (DUONEB) 0.5-2.5 (3) MG/3ML SOLN Take 3 mLs by nebulization every 6 (six) hours as needed. (Patient taking differently: Take 3 mLs by nebulization 2 (two) times daily. ) 360 mL 6 10/29/2016 at Unknown time  . metolazone (ZAROXOLYN) 2.5 MG tablet TAKE ONE TABLET BY MOUTH EVERY MONDAY, WEDNESDAY AND FRIDAY MORNINGS 15 tablet 3 10/28/2016 at Unknown time  . metoprolol tartrate (LOPRESSOR) 25 MG tablet Take 12.5 mg by mouth 2 (two) times daily.   10/29/2016 at Unknown time  . nitroGLYCERIN (NITROSTAT) 0.4 MG SL tablet Place 1 tablet (0.4 mg total) under the tongue every 5 (five) minutes as needed for chest pain. 25 tablet 3 PRN  . OXYGEN Inhale 3 L/min into the lungs continuous.    10/29/2016 at Unknown time  . potassium chloride (K-DUR,KLOR-CON) 10 MEQ tablet Take 40 mEq by mouth 2 (two) times daily.   10/29/2016 at Unknown time  . simvastatin (ZOCOR) 20 MG tablet Take 20 mg by mouth daily.    10/29/2016 at Unknown time  . tiotropium (SPIRIVA) 18 MCG inhalation capsule Place 1 capsule (18 mcg total) into inhaler and inhale daily.  90 capsule 3 10/29/2016 at Unknown time  . torsemide (DEMADEX) 20 MG tablet Take 80 mg by mouth 2 (two) times daily.   10/29/2016 at Unknown time  . warfarin (COUMADIN) 2.5 MG tablet Take 2.5 mg by mouth daily at 6 PM.   10/28/2016 at Unknown time   Scheduled:  . cephALEXin  500 mg Oral Q8H  . chlorhexidine  15 mL Mouth Rinse BID  . furosemide  40 mg Intravenous BID  . insulin aspart  0-15 Units Subcutaneous TID WC  . insulin aspart  0-5 Units Subcutaneous QHS  . insulin glargine  10 Units Subcutaneous Q2200  . ipratropium-albuterol  3 mL Nebulization QID  . lidocaine  1 patch Transdermal Q24H  .  mouth rinse  15 mL Mouth Rinse q12n4p  . metoprolol succinate  12.5 mg Oral Daily  . mometasone-formoterol  2 puff Inhalation BID  . polyethylene glycol  17 g Oral Daily  . potassium chloride  20 mEq Oral BID  . senna-docusate  1 tablet Oral BID  . simvastatin  20 mg Oral q1800  . warfarin  1.25 mg Oral ONCE-1800  . Warfarin - Pharmacist Dosing Inpatient   Does not apply q1800    Assessment: 83 yo male her with HF and UTI with history of afib on coumadin PTA. Pharmacy consulted to dose coumadin. -INR= 2.85 (trend up; 1.98>>2.28>>2.85)  Home coumadin dose: 2.5mg  po daily  Goal of Therapy:  INR 2-3 Monitor platelets by anticoagulation protocol: Yes   Plan:   -Coumadin 1.25 mg po today -Daily PT/INR  Hildred Laser, Pharm D 11/02/2016 11:49 AM

## 2016-11-02 NOTE — Clinical Social Work Note (Signed)
Clinical Social Work Assessment  Patient Details  Name: William Braun MRN: [id number] Date of Birth: May 28, 1933  Date of referral:  11/02/16               Reason for consult:  Facility Placement                Permission sought to share information with:  Facility Sport and exercise psychologist, Family Supports Permission granted to share information::  Yes, Verbal Permission Granted  Name::     Leisure centre manager::  SNFs  Relationship::  Spouse  Contact Information:  [phone]  Housing/Transportation Living arrangements for the past 2 months:  [place] of Information:  Patient, Spouse Patient Interpreter Needed:  None Criminal Activity/Legal Involvement Pertinent to Current Situation/Hospitalization:  No - Comment as needed Significant Relationships:  Adult Children, Spouse Lives with:  Spouse Do you feel safe going back to the place where you live?  No Need for family participation in patient care:  Yes (Comment)  Care giving concerns:  CSW received consult for possible SNF placement at time of discharge. CSW spoke with patient and patient's wife regarding PT recommendation of SNF placement at time of discharge. Patient reported that patient's spouse is currently unable to care for patient at their home given patient's current physical needs and fall risk. Patient expressed understanding of PT recommendation and is agreeable to SNF placement at time of discharge. CSW to continue to follow and assist with discharge planning needs.   Social Worker assessment / plan:  CSW spoke with patient concerning possibility of rehab at [facility] before returning home.  Employment status:  Retired Surveyor, [employer] Care PT Recommendations:  [place] / Referral to community resources:  [place]  Patient/Family's Response to care:  Patient recognizes need for rehab before returning home and is agreeable to a SNF in [place]. Patient  reported preference for [place] [facility] first, then [facility] since he has been there before.  Patient/Family's Understanding of and Emotional Response to Diagnosis, Current Treatment, and Prognosis:  Patient/family is realistic regarding therapy needs and expressed being hopeful for SNF placement. Patient expressed understanding of CSW role and discharge process. No questions/concerns about plan or treatment.    Emotional Assessment Appearance:  Appears stated age Attitude/Demeanor/Rapport:  Other (Appropriate) Affect (typically observed):  Accepting, Appropriate Orientation:  Oriented to Self, Oriented to Place, Oriented to  Time, Oriented to Situation Alcohol / Substance use:  Not Applicable Psych involvement (Current and /or in the community):  No (Comment)  Discharge Needs  Concerns to be addressed:  Care Coordination Readmission within the last 30 days:  No Current discharge risk:  None Barriers to Discharge:  Continued Medical Work up   [employer], [place] 11/02/2016, 3:00 PM

## 2016-11-03 ENCOUNTER — Other Ambulatory Visit: Payer: PPO

## 2016-11-03 ENCOUNTER — Inpatient Hospital Stay (HOSPITAL_COMMUNITY): Payer: PPO

## 2016-11-03 ENCOUNTER — Ambulatory Visit: Payer: PPO | Admitting: Hematology & Oncology

## 2016-11-03 LAB — CBC
HEMATOCRIT: 40.6 % (ref 39.0–52.0)
HEMOGLOBIN: 13.3 g/dL (ref 13.0–17.0)
MCH: 30.1 pg (ref 26.0–34.0)
MCHC: 32.8 g/dL (ref 30.0–36.0)
MCV: 91.9 fL (ref 78.0–100.0)
Platelets: 101 10*3/uL — ABNORMAL LOW (ref 150–400)
RBC: 4.42 MIL/uL (ref 4.22–5.81)
RDW: 18 % — ABNORMAL HIGH (ref 11.5–15.5)
WBC: 24.5 10*3/uL — ABNORMAL HIGH (ref 4.0–10.5)

## 2016-11-03 LAB — CULTURE, BLOOD (ROUTINE X 2)
CULTURE: NO GROWTH
CULTURE: NO GROWTH

## 2016-11-03 LAB — BASIC METABOLIC PANEL
Anion gap: 11 (ref 5–15)
BUN: 45 mg/dL — ABNORMAL HIGH (ref 6–20)
CHLORIDE: 93 mmol/L — AB (ref 101–111)
CO2: 38 mmol/L — AB (ref 22–32)
Calcium: 9 mg/dL (ref 8.9–10.3)
Creatinine, Ser: 1.27 mg/dL — ABNORMAL HIGH (ref 0.61–1.24)
GFR calc Af Amer: 59 mL/min — ABNORMAL LOW (ref >60)
GFR calc non Af Amer: 51 mL/min — ABNORMAL LOW (ref >60)
GLUCOSE: 113 mg/dL — AB (ref 65–99)
Potassium: 3 mmol/L — ABNORMAL LOW (ref 3.5–5.1)
Sodium: 142 mmol/L (ref 135–145)

## 2016-11-03 LAB — GLUCOSE, CAPILLARY
GLUCOSE-CAPILLARY: 185 mg/dL — AB (ref 65–99)
Glucose-Capillary: 120 mg/dL — ABNORMAL HIGH (ref 65–99)
Glucose-Capillary: 197 mg/dL — ABNORMAL HIGH (ref 65–99)
Glucose-Capillary: 172 mg/dL — ABNORMAL HIGH (ref 65–99)

## 2016-11-03 LAB — PROTIME-INR
INR: 2.65
Prothrombin Time: 28.8 seconds — ABNORMAL HIGH (ref 11.4–15.2)

## 2016-11-03 MED ORDER — WARFARIN SODIUM 2.5 MG PO TABS
2.5000 mg | ORAL_TABLET | Freq: Every day | ORAL | Status: DC
  Administered 2016-11-03 – 2016-11-06 (×4): 2.5 mg via ORAL
  Filled 2016-11-03 (×4): qty 1

## 2016-11-03 MED ORDER — POTASSIUM CHLORIDE CRYS ER 20 MEQ PO TBCR
40.0000 meq | EXTENDED_RELEASE_TABLET | ORAL | Status: AC
  Administered 2016-11-03 (×2): 40 meq via ORAL
  Filled 2016-11-03 (×2): qty 2

## 2016-11-03 MED ORDER — POTASSIUM CHLORIDE CRYS ER 10 MEQ PO TBCR
30.0000 meq | EXTENDED_RELEASE_TABLET | Freq: Two times a day (BID) | ORAL | Status: DC
Start: 2016-11-03 — End: 2016-11-04
  Administered 2016-11-03: 21:00:00 30 meq via ORAL
  Filled 2016-11-03: qty 1

## 2016-11-03 MED ORDER — IPRATROPIUM-ALBUTEROL 0.5-2.5 (3) MG/3ML IN SOLN
3.0000 mL | Freq: Three times a day (TID) | RESPIRATORY_TRACT | Status: DC
  Administered 2016-11-03 – 2016-11-07 (×13): 3 mL via RESPIRATORY_TRACT
  Filled 2016-11-03 (×12): qty 3

## 2016-11-03 MED ORDER — METOPROLOL SUCCINATE ER 25 MG PO TB24
25.0000 mg | ORAL_TABLET | Freq: Every day | ORAL | Status: DC
  Administered 2016-11-04 – 2016-11-05 (×2): 25 mg via ORAL
  Filled 2016-11-03 (×2): qty 1

## 2016-11-03 MED ORDER — METOPROLOL SUCCINATE ER 25 MG PO TB24
12.5000 mg | ORAL_TABLET | Freq: Once | ORAL | Status: AC
  Administered 2016-11-03: 12.5 mg via ORAL
  Filled 2016-11-03: qty 1

## 2016-11-03 NOTE — Progress Notes (Signed)
[facility] Progress Note  Patient: William Braun [id number]   PCP: Leeroy Cha, MD DOB: 06/20/1933   DOA: 10/29/2016   DOS: 11/03/2016   Date of Service: the patient was seen and examined on 11/03/2016   Subjective: Feeling about the same, no confusion. Wife at bedside. No acute events.  Brief hospital course: Pt. with PMH of CHF, HTN, type II DM, A. fib on Coumadin; admitted on 10/29/2016, with complaint of infusion and shortness of breath, was found to have UTI. Currently further plan is continue antibiotics and monitor Respiratory function.  Assessment and Plan: 1 Acute encephalopathy- UTI, Resolved Patient presented with confusion, which family states is different than his baseline mental status. Elevated BUN and other metabolic disturbances such as hypokalemia are likely contributing.  Patient's urinalysis also raises the suspicion for urinary tract infection,  Empirically treated for complicated urinary tract infection. Repeat CXR today shows possible pneumonia. Continue oral Keflex, finish 7 days course. Last day 11/05/2016. Blood cultures negative To date, urine culture no significant growth  2 acute on chronic hypoxic respiratory failure. Acute on chronic diastolic CHF. UnSpecified pulmonary hypertension History of COPD on 3 L of oxygen at home, OSA on C Pap Given aggressive volume resuscitation in the emergency department,  Respiratory distress improved after Lasix, renal function also improved. Continue IV Lasix. Stop BiPAP. ABG on admission shows primary respiratory alkalosis with respiratory acidosis. C Pap daily at bedtime. Continue DuoNeb's, does not appear to have any COPD exacerbation at present. On 5 LPM, oxygenation improves to 93-94%on 4.5LPM when he takes deeps breath. Continue incentive spirometry.  3 Diabetes mellitus type 2 Patient takes Lantus at home. Will continue Lantus and add sliding scale insulin for glycemic control. Hemoglobin A1c  7.7, uncontrolled.  4. Leukocytosis. Improving History of CLL. Patient presented with leukocytosis concerning for severe sepsis although patient has history of CLL, follows up with oncology, notified oncology of admission. Normal IgG levels.  5.Atrial fibrillation Patient has a long-standing history of atrial fibrillation with elevated chadsVas score. He takes warfarin at home. Rate controlled. Increase to 25mg  Toprol daily.  5. Chronic kidney disease stage IV. Hypokalemia Better then baseline  Mild worsening on admission, received IV fluids. Chronic elevation of BUN with acute worsening suspected initially from dehydration. Renal function better with IV Lasix. Replace potassium.  6. Lactic acidosis. Etiology unclear suspected sepsis on admission patient received IV fluids. Lactic acid values normal this morning. We'll monitor.  Bowel regimen: last BM 11/02/2016, bowel regimen continue Diet: cardiac diet DVT Prophylaxis: Warfarin  Advance goals of care discussion: full code  Family Communication: family was present at bedside, at the time of interview.  Disposition:  Discharge to SNF. Social worker consulted Expected discharge date: 11/07/2016, stabilization of respiratory status and renal function  Consultants: none Procedures: BiPAP  Antibiotics: Anti-infectives    Start     Dose/Rate Route Frequency Ordered Stop   11/02/16 2200  cephALEXin (KEFLEX) capsule 500 mg     500 mg Oral Every 8 hours 11/02/16 1041     10/31/16 1700  piperacillin-tazobactam (ZOSYN) IVPB 3.375 g  Status:  Discontinued     3.375 g 12.5 mL/hr over 240 Minutes Intravenous Every 8 hours 10/31/16 1019 10/31/16 1109   10/31/16 1400  cephALEXin (KEFLEX) capsule 500 mg  Status:  Discontinued     500 mg Oral Every 8 hours 10/31/16 1109 10/31/16 1119   10/31/16 1130  cephALEXin (KEFLEX) capsule 500 mg  Status:  Discontinued     500  mg Oral Every 12 hours 10/31/16 1119 11/02/16 1041   10/31/16 1100   piperacillin-tazobactam (ZOSYN) IVPB 3.375 g  Status:  Discontinued     3.375 g 100 mL/hr over 30 Minutes Intravenous  Once 10/31/16 1019 10/31/16 1109   10/30/16 2200  cefTRIAXone (ROCEPHIN) 2 g in dextrose 5 % 50 mL IVPB  Status:  Discontinued     2 g 100 mL/hr over 30 Minutes Intravenous Every 24 hours 10/30/16 0219 10/31/16 1006   10/30/16 0230  cefTRIAXone (ROCEPHIN) 1 g in dextrose 5 % 50 mL IVPB  Status:  Discontinued     1 g 100 mL/hr over 30 Minutes Intravenous  Once 10/30/16 0219 10/30/16 0229   10/29/16 2145  cefTRIAXone (ROCEPHIN) 1 g in dextrose 5 % 50 mL IVPB     1 g 100 mL/hr over 30 Minutes Intravenous  Once 10/29/16 2130 10/29/16 2227      Objective: Physical Exam: Vitals:   11/03/16 0801 11/03/16 0806 11/03/16 1238 11/03/16 1606  BP:  121/88 99/67   Pulse:  92 98   Resp:  16 (!) 24   Temp:  97.1 F (36.2 C) 97.7 F (36.5 C)   TempSrc:  Axillary Oral   SpO2: 95% 96% 92% 91%    Intake/Output Summary (Last 24 hours) at 11/03/16 1645 Last data filed at 11/03/16 1200  Gross per 24 hour  Intake              240 ml  Output             1800 ml  Net            -1560 ml   There were no vitals filed for this visit.  General: Alert, Awake and Oriented to Time, Place and Person. Appear in moderate distress, Eyes: PERRL, Conjunctiva normal ENT: Oral Mucosa clear moist. Neck: no JVD, no Abnormal Mass Or lumps Cardiovascular: S1 and S2 Present, no Murmur, Respiratory: Bilateral Air entry equal and Decreased, no use of accessory muscle, basal Crackles, no wheezes Abdomen: Bowel Sound present, Soft and no tenderness Skin: no redness, no Rash, no induration Extremities: trace Pedal edema, no calf tenderness Neurologic: Grossly no focal neuro deficit. Bilaterally Equal motor strength  Data Reviewed: CBC:  Recent Labs Lab 10/29/16 2038 10/30/16 0238 10/31/16 0226 11/01/16 0224 11/02/16 0222 11/03/16 0202  WBC 38.6* 34.1* 35.5* 25.9* 21.4* 24.5*  NEUTROABS  16.6*  --  17.8* 11.1*  --   --   HGB 16.6 13.4 13.4 13.1 12.8* 13.3  HCT 47.2 39.5 40.0 40.4 38.3* 40.6  MCV 89.6 89.8 91.1 93.1 93.2 91.9  PLT 130* 128* 155 135* 105* [id number]*   Basic Metabolic Panel:  Recent Labs Lab 10/30/16 0238 10/31/16 0226 10/31/16 1819 11/01/16 0224 11/02/16 0222 11/03/16 0202  NA 134* 141 144 142 143 142  K 3.8 3.3* 3.1* 3.1* 3.6 3.0*  CL 83* 92* 94* 90* 90* 93*  CO2 33* 35* 39* 40* 43* 38*  GLUCOSE 376* 157* 214* 151* 138* 113*  BUN 105* 94* 79* 70* 49* 45*  CREATININE 2.21* 1.84* 1.60* 1.55* 1.38* 1.27*  CALCIUM 8.5* 8.7* 9.1 9.3 9.2 9.0  MG 1.9 2.2  --  2.4 2.1  --   PHOS 4.6  --   --   --   --   --     Liver Function Tests:  Recent Labs Lab 10/29/16 2038 10/30/16 0238 10/31/16 0226 11/01/16 0224  AST 32 31 26 22   ALT 18 18  17 16*  ALKPHOS 79 61 60 57  BILITOT 1.6* 1.3* 0.8 0.9  PROT 7.6 6.2* 6.5 6.4*  ALBUMIN 3.2* 2.5* 2.7* 2.9*   No results for input(s): LIPASE, AMYLASE in the last 168 hours.  Recent Labs Lab 10/30/16 0903  AMMONIA 19   Coagulation Profile:  Recent Labs Lab 10/30/16 0238 10/31/16 0226 11/01/16 0224 11/02/16 0222 11/03/16 0202  INR 1.89 1.98 2.28 2.85 2.65   Cardiac Enzymes: No results for input(s): CKTOTAL, CKMB, CKMBINDEX, TROPONINI in the last 168 hours. BNP (last 3 results) No results for input(s): PROBNP in the last 8760 hours.  CBG:  Recent Labs Lab 11/02/16 1139 11/02/16 1612 11/02/16 2127 11/03/16 0805 11/03/16 1236  GLUCAP 196* 258* 89 120* 185*    Studies: Dg Chest Port 1 View  Result Date: 11/03/2016 CLINICAL DATA:  Shortness of breath.  CHF . EXAM: PORTABLE CHEST 1 VIEW COMPARISON:  10/20/2016.  10/29/2016.  Chest CT 09/19/2016. FINDINGS: Mediastinal and hilar fullness again noted consistent adenopathy. Stable cardiomegaly. No focal infiltrate. Mild infiltrate left lung base. No pleural effusion or pneumothorax . IMPRESSION: 1. Mediastinum and hilar fullness again noted consistent  adenopathy. Similar findings noted on prior CT of 09/19/2016 . 2.  Mild infiltrate left lower lobe consistent pneumonia . Electronically Signed   By: Marcello Moores  Register   On: 11/03/2016 09:26     Scheduled Meds: . cephALEXin  500 mg Oral Q8H  . chlorhexidine  15 mL Mouth Rinse BID  . furosemide  40 mg Intravenous BID  . insulin aspart  0-15 Units Subcutaneous TID WC  . insulin aspart  0-5 Units Subcutaneous QHS  . insulin glargine  10 Units Subcutaneous Q2200  . ipratropium-albuterol  3 mL Nebulization TID  . lidocaine  1 patch Transdermal Q24H  . mouth rinse  15 mL Mouth Rinse q12n4p  . [START ON 11/04/2016] metoprolol succinate  25 mg Oral Daily  . mometasone-formoterol  2 puff Inhalation BID  . polyethylene glycol  17 g Oral Daily  . potassium chloride  30 mEq Oral BID  . senna-docusate  1 tablet Oral BID  . simvastatin  20 mg Oral q1800  . warfarin  2.5 mg Oral q1800  . Warfarin - Pharmacist Dosing Inpatient   Does not apply q1800   Continuous Infusions:  PRN Meds: albuterol, ALPRAZolam, alum & mag hydroxide-simeth, bisacodyl, ondansetron  Time spent: 30 minutes  Author: Berle Mull, MD [facility] Pager: [phone] 11/03/2016 4:45 PM  If 7PM-7AM, please contact night-coverage at [email or web address], password [facility]

## 2016-11-03 NOTE — Progress Notes (Signed)
PT Cancellation Note  Patient Details Name: William Braun MRN: [id number] DOB: 08/09/1933   Cancelled Treatment:    Reason Eval/Treat Not Completed: Patient declined, no reason specified. Pt sitting OOB in recliner when PT entered. Pt requesting to remain in recliner and therefore deferred transfer at this time. Pt also refusing to participate in therex at this time. PT will continue to f/u with pt as appropriate.   [place] 11/03/2016, 4:39 PM

## 2016-11-03 NOTE — Consult Note (Addendum)
[place] Nurse wound consult note Reason for Consult: Consult requested for buttocks. Pt had reddish purple skin changes to these locations, and a small open wound on the left buttock; these were noted as present on admission.  Wife at bedside to assess skin and discuss plan of care. Wound type: Generalized erythremia and dark purple skin across bilt buttocks, affected area approx 3X3cm Left buttock with partial thickness abrasion; .2X.2X.1cm, pink and moist.  It is not located over a bony prominence and appearance and location are consistent with a shear injury Pressure Injury POA: This was present on admission but is NOT a pressure injury Dressing procedure/placement/frequency: Foam dressing has been appplied to protect and promote healing.  Discussed plan of care with patient and wife and they verbalized understanding. Please re-consult if further assistance is needed.  Thank-you,  Julien Girt MSN, [place], [place], [place], [place]

## 2016-11-03 NOTE — Progress Notes (Signed)
[place] for coumadin Indication: atrial fibrillation  Allergies  Allergen Reactions  . Quinine Palpitations and Other (See Comments)    Seizure      Vital Signs: Temp: 97.1 F (36.2 C) (02/15 0806) Temp Source: Axillary (02/15 0806) BP: 121/88 (02/15 0806) Pulse Rate: 92 (02/15 0806)  Labs:  Recent Labs  11/01/16 0224 11/02/16 0222 11/03/16 0202  HGB 13.1 12.8* 13.3  HCT 40.4 38.3* 40.6  PLT 135* 105* 101*  LABPROT 25.5* 30.5* 28.8*  INR 2.28 2.85 2.65  CREATININE 1.55* 1.38* 1.27*    CrCl cannot be calculated (Unknown ideal weight.).   Medical History: Past Medical History:  Diagnosis Date  . Atrial fibrillation ([place])   . Cerebrovascular disease   . CHF (congestive heart failure) ([place])   . CLL (chronic lymphocytic leukemia) ([place]) 11/17/2014  . COPD (chronic obstructive pulmonary disease) ([place])   . History of thrombocytopenia   . Obesity   . Osteoarthritis   . Other and unspecified hyperlipidemia   . Type II or unspecified type diabetes mellitus without mention of complication, not stated as uncontrolled   . Unspecified essential hypertension     Medications:  Prescriptions Prior to Admission  Medication Sig Dispense Refill Last Dose  . albuterol (VENTOLIN HFA) 108 (90 BASE) MCG/ACT inhaler Inhale 2 puffs into the lungs every 6 (six) hours as needed for wheezing or shortness of breath. 3 Inhaler 1 Past Month at Unknown time  . budesonide-formoterol (SYMBICORT) 160-4.5 MCG/ACT inhaler Inhale 2 puffs into the lungs 2 (two) times daily. 2 puffs twice daily 3 Inhaler 3 10/29/2016 at Unknown time  . ciprofloxacin (CIPRO) 250 MG tablet Take 250 mg by mouth 2 (two) times daily.   10/29/2016 at AM  . glimepiride (AMARYL) 4 MG tablet Take 8 mg by mouth daily with breakfast.   10/29/2016 at Unknown time  . HYDROcodone-acetaminophen (NORCO/VICODIN) 5-325 MG tablet Take 1 tablet by mouth every 6 (six) hours as needed for moderate  pain.   10/28/2016 at Unknown time  . Insulin Glargine (LANTUS) 100 UNIT/ML Solostar Pen Inject 20 Units into the skin daily at 10 pm. 15 mL 11 10/28/2016 at Unknown time  . ipratropium-albuterol (DUONEB) 0.5-2.5 (3) MG/3ML SOLN Take 3 mLs by nebulization every 6 (six) hours as needed. (Patient taking differently: Take 3 mLs by nebulization 2 (two) times daily. ) 360 mL 6 10/29/2016 at Unknown time  . metolazone (ZAROXOLYN) 2.5 MG tablet TAKE ONE TABLET BY MOUTH EVERY MONDAY, WEDNESDAY AND FRIDAY MORNINGS 15 tablet 3 10/28/2016 at Unknown time  . metoprolol tartrate (LOPRESSOR) 25 MG tablet Take 12.5 mg by mouth 2 (two) times daily.   10/29/2016 at Unknown time  . nitroGLYCERIN (NITROSTAT) 0.4 MG SL tablet Place 1 tablet (0.4 mg total) under the tongue every 5 (five) minutes as needed for chest pain. 25 tablet 3 PRN  . OXYGEN Inhale 3 L/min into the lungs continuous.    10/29/2016 at Unknown time  . potassium chloride (K-DUR,KLOR-CON) 10 MEQ tablet Take 40 mEq by mouth 2 (two) times daily.   10/29/2016 at Unknown time  . simvastatin (ZOCOR) 20 MG tablet Take 20 mg by mouth daily.    10/29/2016 at Unknown time  . tiotropium (SPIRIVA) 18 MCG inhalation capsule Place 1 capsule (18 mcg total) into inhaler and inhale daily. 90 capsule 3 10/29/2016 at Unknown time  . torsemide (DEMADEX) 20 MG tablet Take 80 mg by mouth 2 (two) times daily.   10/29/2016 at Unknown time  .  warfarin (COUMADIN) 2.5 MG tablet Take 2.5 mg by mouth daily at 6 PM.   10/28/2016 at Unknown time   Scheduled:  . cephALEXin  500 mg Oral Q8H  . chlorhexidine  15 mL Mouth Rinse BID  . furosemide  40 mg Intravenous BID  . insulin aspart  0-15 Units Subcutaneous TID WC  . insulin aspart  0-5 Units Subcutaneous QHS  . insulin glargine  10 Units Subcutaneous Q2200  . ipratropium-albuterol  3 mL Nebulization TID  . lidocaine  1 patch Transdermal Q24H  . mouth rinse  15 mL Mouth Rinse q12n4p  . metoprolol succinate  12.5 mg Oral Daily  .  mometasone-formoterol  2 puff Inhalation BID  . polyethylene glycol  17 g Oral Daily  . potassium chloride  40 mEq Oral Q2H  . senna-docusate  1 tablet Oral BID  . simvastatin  20 mg Oral q1800  . Warfarin - Pharmacist Dosing Inpatient   Does not apply q1800    Assessment: 83 yo male her with HF and UTI with history of afib on coumadin PTA. Pharmacy consulted to dose coumadin. -INR= 2.65   Home coumadin dose: 2.5mg  po daily  Goal of Therapy:  INR 2-3 Monitor platelets by anticoagulation protocol: Yes   Plan:   -Coumadin 2.5mg  po daily -Daily PT/INR  Hildred Laser, Pharm D 11/03/2016 9:51 AM

## 2016-11-04 LAB — CBC
HEMATOCRIT: 40.5 % (ref 39.0–52.0)
Hemoglobin: 13.2 g/dL (ref 13.0–17.0)
MCH: 30.1 pg (ref 26.0–34.0)
MCHC: 32.6 g/dL (ref 30.0–36.0)
MCV: 92.5 fL (ref 78.0–100.0)
Platelets: 114 10*3/uL — ABNORMAL LOW (ref 150–400)
RBC: 4.38 MIL/uL (ref 4.22–5.81)
RDW: 18.1 % — ABNORMAL HIGH (ref 11.5–15.5)
WBC: 24.2 10*3/uL — AB (ref 4.0–10.5)

## 2016-11-04 LAB — GLUCOSE, CAPILLARY
GLUCOSE-CAPILLARY: 183 mg/dL — AB (ref 65–99)
GLUCOSE-CAPILLARY: 185 mg/dL — AB (ref 65–99)
GLUCOSE-CAPILLARY: 249 mg/dL — AB (ref 65–99)
Glucose-Capillary: 121 mg/dL — ABNORMAL HIGH (ref 65–99)

## 2016-11-04 LAB — BASIC METABOLIC PANEL
ANION GAP: 10 (ref 5–15)
BUN: 39 mg/dL — ABNORMAL HIGH (ref 6–20)
CO2: 39 mmol/L — AB (ref 22–32)
Calcium: 9.1 mg/dL (ref 8.9–10.3)
Chloride: 94 mmol/L — ABNORMAL LOW (ref 101–111)
Creatinine, Ser: 1.47 mg/dL — ABNORMAL HIGH (ref 0.61–1.24)
GFR calc Af Amer: 50 mL/min — ABNORMAL LOW (ref >60)
GFR, EST NON AFRICAN AMERICAN: 43 mL/min — AB (ref >60)
GLUCOSE: 134 mg/dL — AB (ref 65–99)
POTASSIUM: 4.6 mmol/L (ref 3.5–5.1)
Sodium: 143 mmol/L (ref 135–145)

## 2016-11-04 LAB — PROTIME-INR
INR: 2.47
Prothrombin Time: 27.2 seconds — ABNORMAL HIGH (ref 11.4–15.2)

## 2016-11-04 MED ORDER — METHYLPREDNISOLONE SODIUM SUCC 125 MG IJ SOLR
60.0000 mg | Freq: Two times a day (BID) | INTRAMUSCULAR | Status: DC
  Administered 2016-11-04 (×2): 60 mg via INTRAVENOUS
  Filled 2016-11-04 (×2): qty 2

## 2016-11-04 MED ORDER — TORSEMIDE 20 MG PO TABS
40.0000 mg | ORAL_TABLET | Freq: Two times a day (BID) | ORAL | Status: DC
  Administered 2016-11-04 – 2016-11-07 (×7): 40 mg via ORAL
  Filled 2016-11-04 (×7): qty 2

## 2016-11-04 MED ORDER — POTASSIUM CHLORIDE CRYS ER 20 MEQ PO TBCR
20.0000 meq | EXTENDED_RELEASE_TABLET | Freq: Two times a day (BID) | ORAL | Status: DC
  Administered 2016-11-04 – 2016-11-07 (×7): 20 meq via ORAL
  Filled 2016-11-04 (×7): qty 1

## 2016-11-04 NOTE — Progress Notes (Addendum)
[place] for coumadin Indication: atrial fibrillation    Assessment: 83 yo male her with HF and UTI with history of afib on coumadin PTA. Pharmacy consulted to dose coumadin.  -INR= 2.4  Home coumadin dose: 2.5mg  po daily  Goal of Therapy:  INR 2-3 Monitor platelets by anticoagulation protocol: Yes   Plan:   -Continue coumadin 2.5mg  po daily -Daily PT/INR    Allergies  Allergen Reactions  . Quinine Palpitations and Other (See Comments)    Seizure      Vital Signs: Temp: 98.2 F (36.8 C) (02/16 0700) Temp Source: Oral (02/16 0700) BP: 126/80 (02/16 0800) Pulse Rate: 91 (02/16 0800)  Labs:  Recent Labs  11/02/16 0222 11/03/16 0202 11/04/16 0250  HGB 12.8* 13.3 13.2  HCT 38.3* 40.6 40.5  PLT 105* 101* 114*  LABPROT 30.5* 28.8* 27.2*  INR 2.85 2.65 2.47  CREATININE 1.38* 1.27* 1.47*    Estimated Creatinine Clearance: 53.8 mL/min (by C-G formula based on SCr of 1.47 mg/dL (H)).   Medical History: Past Medical History:  Diagnosis Date  . Atrial fibrillation ([place])   . Cerebrovascular disease   . CHF (congestive heart failure) ([place])   . CLL (chronic lymphocytic leukemia) ([place]) 11/17/2014  . COPD (chronic obstructive pulmonary disease) ([place])   . History of thrombocytopenia   . Obesity   . Osteoarthritis   . Other and unspecified hyperlipidemia   . Type II or unspecified type diabetes mellitus without mention of complication, not stated as uncontrolled   . Unspecified essential hypertension    Erin Hearing PharmD., BCPS Clinical Pharmacist Pager [phone]

## 2016-11-04 NOTE — Consult Note (Signed)
   [facility] CM Inpatient Consult   11/04/2016  William Braun 1933/09/14 [id number]   Patient reviewed for Hx of HF and previously active with [facility] Management. Patient is in [place] with [employer] plan.  Chart review reveals patient current disposiiton is for a skilled nursing facility for rehab. Will follow for changes to plan or [facility] Care Management needs. For questions please contact:  Natividad Brood, RN BSN [facility] Liaison  [phone] business mobile phone Toll free office [phone]

## 2016-11-04 NOTE — Progress Notes (Signed)
Physical Therapy Treatment Patient Details Name: William Braun MRN: [id number] DOB: May 18, 1933 Today's Date: 11/04/2016    History of Present Illness pt presents with complicated UTI, Sepsis,  COPD Exacerbation.  pt with hx of COPD, Chronic Respiratory Failure, OSA, HTN, DM, CLL, CHF, A-fib, CVA, and A-fib.      PT Comments    Pt presented supine in bed with HOB elevated, awake and willing to participate in therapy session. Pt's wife present throughout. Pt making slow progress with mobility. He performed sit-to-stand transfer from bed x2 with mod A. Pt on 4.5L of O2 via [place] throughout, with decrease in SPO2 to mid 80's with activity and increase in HR to 127 bpm with standing. At rest in sitting, pt's SPO2 in low 90's with HR in 110's. Pt would continue to benefit from skilled physical therapy services at this time while admitted and after d/c to address his limitations in order to improve his overall safety and independence with functional mobility.    Follow Up Recommendations  SNF     Equipment Recommendations  None recommended by PT    Recommendations for Other Services OT consult     Precautions / Restrictions Precautions Precautions: Other (comment) Precaution Comments: monitor SPO2 and HR Restrictions Weight Bearing Restrictions: No    Mobility  Bed Mobility Overal bed mobility: Needs Assistance Bed Mobility: Supine to Sit     Supine to sit: Min guard;HOB elevated     General bed mobility comments: increased time, use of bed rails, min guard for safety  Transfers Overall transfer level: Needs assistance Equipment used: Rolling walker (2 wheeled) Transfers: Sit to/from Stand Sit to Stand: Mod assist         General transfer comment: increased time, used bilateral UEs on RW to pull into standing, required mod A to rise from bed. Pt performed sit-to-stand x2, standing for one minute and then 30 seconds  Ambulation/Gait                 Stairs             Wheelchair Mobility    Modified Rankin (Stroke Patients Only)       Balance Overall balance assessment: Needs assistance Sitting-balance support: Feet supported Sitting balance-Leahy Scale: Fair     Standing balance support: During functional activity;Bilateral upper extremity supported Standing balance-Leahy Scale: Poor Standing balance comment: heavy reliance on bilateral UEs on RW                    Cognition Arousal/Alertness: Awake/alert Behavior During Therapy: WFL for tasks assessed/performed Overall Cognitive Status: Within Functional Limits for tasks assessed                      Exercises General Exercises - Lower Extremity Ankle Circles/Pumps: AROM;Both;15 reps;Supine Heel Slides: AROM;Strengthening;Both;10 reps;Supine    General Comments        Pertinent Vitals/Pain Pain Assessment: Faces Faces Pain Scale: Hurts little more Pain Location: buttocks Pain Descriptors / Indicators: Sore Pain Intervention(s): Monitored during session    Home Living                      Prior Function            PT Goals (current goals can now be found in the care plan section) Acute Rehab PT Goals Patient Stated Goal: Wants to ge better and be home PT Goal Formulation: With patient Time For Goal Achievement: 11/15/16  Potential to Achieve Goals: Fair Progress towards PT goals: Progressing toward goals    Frequency    Min 3X/week      PT Plan Current plan remains appropriate    Co-evaluation             End of Session Equipment Utilized During Treatment: Gait belt;Oxygen (4.5 L of O2) Activity Tolerance: Patient limited by fatigue;Patient limited by pain Patient left: in bed;with call bell/phone within reach;with family/visitor present (sitting EOB with wife)     Time: [id number] PT Time Calculation (min) (ACUTE ONLY): 15 min  Charges:  $Therapeutic Activity: 8-22 mins                    G CodesClearnce Sorrel  Lesle Faron 2016/11/06, 3:34 PM Sherie Don, [place], DPT [phone]

## 2016-11-04 NOTE — Consult Note (Addendum)
[place] Nurse wound consult note Reason for Consult: Consult requested for left finger.  Pt states he developed a blister at home but does not recall how it occurred, "it may have been a burn". Previous blister has ruptured and evolved into a full thickness wound with a loose piece of skin flap Wound type: Full thickness wound to tip of finger; 1X1.2X.1cm, red and moist, no odor, minimal amt yellow drainage.  Removed loose flap of skin with scissors without c/o pain or bleeding. Dressing procedure/placement/frequency: Aquacel to absorb drainage and provide antimicrobial benefits.  Foam dressing to protect from further injury. Pt's wife at the bedside to assess wound and discuss plan of care. Please re-consult if further assistance is needed.  Thank-you,  Julien Girt MSN, [place], [place], [place], [place]

## 2016-11-04 NOTE — Progress Notes (Signed)
[facility] Progress Note  Patient: William Braun [id number]   PCP: Leeroy Cha, MD DOB: 1933-05-21   DOA: 10/29/2016   DOS: 11/04/2016   Date of Service: the patient was seen and examined on 11/04/2016   Subjective: Continues to have shortness of breath. No chest pain and abdominal pain.  Brief hospital course: Pt. with PMH of CHF, HTN, type II DM, A. fib on Coumadin; admitted on 10/29/2016, with complaint of infusion and shortness of breath, was found to have UTI. Appears to have acute on chronic diastolic CHF with acute hypoxic respiratory failure. Possible pneumonia also cannot be ruled out. Currently further plan is continue diuresis and monitor Respiratory function.  Assessment and Plan: 1 Acute encephalopathy- UTI, Resolved Patient presented with confusion, which family states is different than his baseline mental status. Elevated BUN and other metabolic disturbances such as hypokalemia are likely contributing.  Patient's urinalysis also raises the suspicion for urinary tract infection,  Empirically treated for complicated urinary tract infection. Repeat CXR today shows possible pneumonia. Continue oral Keflex, finish 7 days course. Last day 11/05/2016. Blood cultures negative To date, urine culture no significant growth  2 acute on chronic hypoxic respiratory failure. Acute on chronic diastolic CHF. UnSpecified pulmonary hypertension. History of COPD on 3 L of oxygen at home, OSA on C Pap. Given aggressive volume resuscitation in the emergency department,  Respiratory distress improved after Lasix, renal function also improved. I'll worsening of the renal function today, we will change IV Lasix to oral torsemide 40 mg twice a day. Continue diuresis, Stop BiPAP. ABG on admission shows primary respiratory alkalosis with respiratory acidosis. C Pap daily at bedtime. Continue DuoNeb's, started on Solu-Medrol every 12 hours. On 5 LPM,Continue incentive  spirometry.  3 Diabetes mellitus type 2 Patient takes Lantus at home. Will continue Lantus and add sliding scale insulin for glycemic control. Hemoglobin A1c 7.7, uncontrolled.  4. Leukocytosis. Improving History of CLL. Patient presented with leukocytosis concerning for severe sepsis although patient has history of CLL, follows up with oncology, notified oncology of admission. Normal IgG levels. D.w oncology, no change in plan.   5.Atrial fibrillation Patient has a long-standing history of atrial fibrillation with elevated chadsVas score. He takes warfarin at home. Rate controlled. Increase to 25mg  Toprol daily.  5. Chronic kidney disease stage IV. Hypokalemia Better then baseline  Mild worsening on admission, received IV fluids. Chronic elevation of BUN with acute worsening suspected initially from dehydration. Renal function better with IV Lasix. Replace potassium.  6. Lactic acidosis. Etiology unclear suspected sepsis on admission patient received IV fluids. Lactic acid values near normal.  Bowel regimen: last BM 11/04/2016, bowel regimen continue Diet: cardiac diet DVT Prophylaxis: Warfarin  Advance goals of care discussion: full code  Family Communication: no family was present at bedside, at the time of interview.  Disposition:  Discharge to SNF. Social worker consulted Expected discharge date: 11/07/2016, stabilization of respiratory status and renal function  Consultants: none Procedures: BiPAP  Antibiotics: Anti-infectives    Start     Dose/Rate Route Frequency Ordered Stop   11/02/16 2200  cephALEXin (KEFLEX) capsule 500 mg     500 mg Oral Every 8 hours 11/02/16 1041     10/31/16 1700  piperacillin-tazobactam (ZOSYN) IVPB 3.375 g  Status:  Discontinued     3.375 g 12.5 mL/hr over 240 Minutes Intravenous Every 8 hours 10/31/16 1019 10/31/16 1109   10/31/16 1400  cephALEXin (KEFLEX) capsule 500 mg  Status:  Discontinued  500 mg Oral Every 8 hours  10/31/16 1109 10/31/16 1119   10/31/16 1130  cephALEXin (KEFLEX) capsule 500 mg  Status:  Discontinued     500 mg Oral Every 12 hours 10/31/16 1119 11/02/16 1041   10/31/16 1100  piperacillin-tazobactam (ZOSYN) IVPB 3.375 g  Status:  Discontinued     3.375 g 100 mL/hr over 30 Minutes Intravenous  Once 10/31/16 1019 10/31/16 1109   10/30/16 2200  cefTRIAXone (ROCEPHIN) 2 g in dextrose 5 % 50 mL IVPB  Status:  Discontinued     2 g 100 mL/hr over 30 Minutes Intravenous Every 24 hours 10/30/16 0219 10/31/16 1006   10/30/16 0230  cefTRIAXone (ROCEPHIN) 1 g in dextrose 5 % 50 mL IVPB  Status:  Discontinued     1 g 100 mL/hr over 30 Minutes Intravenous  Once 10/30/16 0219 10/30/16 0229   10/29/16 2145  cefTRIAXone (ROCEPHIN) 1 g in dextrose 5 % 50 mL IVPB     1 g 100 mL/hr over 30 Minutes Intravenous  Once 10/29/16 2130 10/29/16 2227      Objective: Physical Exam: Vitals:   11/04/16 1000 11/04/16 1100 11/04/16 1200 11/04/16 1300  BP: 105/79 109/71 107/78 119/84  Pulse: (!) 111 (!) 105 87 90  Resp: (!) 36 16 (!) 24 (!) 28  Temp: 98.1 F (36.7 C)     TempSrc: Oral     SpO2: (!) 89% (!) 87% 94% 91%  Weight: 117.9 kg (259 lb 14.4 oz)     Height: 6\' 2"  (1.88 m)       Intake/Output Summary (Last 24 hours) at 11/04/16 1518 Last data filed at 11/04/16 1200  Gross per 24 hour  Intake              480 ml  Output             1925 ml  Net            -1445 ml   Filed Weights   11/04/16 1000  Weight: 117.9 kg (259 lb 14.4 oz)    General: Alert, Awake and Oriented to Time, Place and Person. Appear in mild distress, Eyes: PERRL, Conjunctiva normal ENT: Oral Mucosa clear moist. Neck: no JVD, no Abnormal Mass Or lumps Cardiovascular: S1 and S2 Present, no Murmur, Respiratory: Bilateral Air entry equal and Decreased, no use of accessory muscle, basal Crackles, no wheezes Abdomen: Bowel Sound present, Soft and no tenderness Skin: no redness, no Rash, no induration Extremities: trace Pedal  edema, no calf tenderness Neurologic: Grossly no focal neuro deficit. Bilaterally Equal motor strength  Data Reviewed: CBC:  Recent Labs Lab 10/29/16 2038  10/31/16 0226 11/01/16 0224 11/02/16 0222 11/03/16 0202 11/04/16 0250  WBC 38.6*  < > 35.5* 25.9* 21.4* 24.5* 24.2*  NEUTROABS 16.6*  --  17.8* 11.1*  --   --   --   HGB 16.6  < > 13.4 13.1 12.8* 13.3 13.2  HCT 47.2  < > 40.0 40.4 38.3* 40.6 40.5  MCV 89.6  < > 91.1 93.1 93.2 91.9 92.5  PLT 130*  < > 155 135* 105* 101* 114*  < > = values in this interval not displayed. Basic Metabolic Panel:  Recent Labs Lab 10/30/16 0238 10/31/16 0226 10/31/16 1819 11/01/16 0224 11/02/16 0222 11/03/16 0202 11/04/16 0250  NA 134* 141 144 142 143 142 143  K 3.8 3.3* 3.1* 3.1* 3.6 3.0* 4.6  CL 83* 92* 94* 90* 90* 93* 94*  CO2 33* 35* 39* 40*  43* 38* 39*  GLUCOSE 376* 157* 214* 151* 138* 113* 134*  BUN 105* 94* 79* 70* 49* 45* 39*  CREATININE 2.21* 1.84* 1.60* 1.55* 1.38* 1.27* 1.47*  CALCIUM 8.5* 8.7* 9.1 9.3 9.2 9.0 9.1  MG 1.9 2.2  --  2.4 2.1  --   --   PHOS 4.6  --   --   --   --   --   --     Liver Function Tests:  Recent Labs Lab 10/29/16 2038 10/30/16 0238 10/31/16 0226 11/01/16 0224  AST 32 31 26 22   ALT 18 18 17  16*  ALKPHOS 79 61 60 57  BILITOT 1.6* 1.3* 0.8 0.9  PROT 7.6 6.2* 6.5 6.4*  ALBUMIN 3.2* 2.5* 2.7* 2.9*   No results for input(s): LIPASE, AMYLASE in the last 168 hours.  Recent Labs Lab 10/30/16 0903  AMMONIA 19   Coagulation Profile:  Recent Labs Lab 10/31/16 0226 11/01/16 0224 11/02/16 0222 11/03/16 0202 11/04/16 0250  INR 1.98 2.28 2.85 2.65 2.47   Cardiac Enzymes: No results for input(s): CKTOTAL, CKMB, CKMBINDEX, TROPONINI in the last 168 hours. BNP (last 3 results) No results for input(s): PROBNP in the last 8760 hours.  CBG:  Recent Labs Lab 11/03/16 1236 11/03/16 1647 11/03/16 2115 11/04/16 0740 11/04/16 1118  GLUCAP 185* 197* 172* 121* 183*    Studies: No  results found.   Scheduled Meds: . cephALEXin  500 mg Oral Q8H  . chlorhexidine  15 mL Mouth Rinse BID  . insulin aspart  0-15 Units Subcutaneous TID WC  . insulin aspart  0-5 Units Subcutaneous QHS  . insulin glargine  10 Units Subcutaneous Q2200  . ipratropium-albuterol  3 mL Nebulization TID  . lidocaine  1 patch Transdermal Q24H  . mouth rinse  15 mL Mouth Rinse q12n4p  . methylPREDNISolone (SOLU-MEDROL) injection  60 mg Intravenous Q12H  . metoprolol succinate  25 mg Oral Daily  . mometasone-formoterol  2 puff Inhalation BID  . polyethylene glycol  17 g Oral Daily  . potassium chloride  20 mEq Oral BID  . senna-docusate  1 tablet Oral BID  . simvastatin  20 mg Oral q1800  . torsemide  40 mg Oral BID  . warfarin  2.5 mg Oral q1800  . Warfarin - Pharmacist Dosing Inpatient   Does not apply q1800   Continuous Infusions:  PRN Meds: albuterol, ALPRAZolam, alum & mag hydroxide-simeth, bisacodyl, ondansetron  Time spent: 30 minutes  Author: Berle Mull, MD [facility] Pager: [phone] 11/04/2016 3:18 PM  If 7PM-7AM, please contact night-coverage at [email or web address], password [facility]

## 2016-11-05 LAB — CBC
HCT: 42.2 % (ref 39.0–52.0)
Hemoglobin: 13.8 g/dL (ref 13.0–17.0)
MCH: 29.9 pg (ref 26.0–34.0)
MCHC: 32.7 g/dL (ref 30.0–36.0)
MCV: 91.5 fL (ref 78.0–100.0)
PLATELETS: 126 10*3/uL — AB (ref 150–400)
RBC: 4.61 MIL/uL (ref 4.22–5.81)
RDW: 17.8 % — ABNORMAL HIGH (ref 11.5–15.5)
WBC: 25.9 10*3/uL — AB (ref 4.0–10.5)

## 2016-11-05 LAB — GLUCOSE, CAPILLARY
GLUCOSE-CAPILLARY: 191 mg/dL — AB (ref 65–99)
Glucose-Capillary: 191 mg/dL — ABNORMAL HIGH (ref 65–99)
Glucose-Capillary: 254 mg/dL — ABNORMAL HIGH (ref 65–99)
Glucose-Capillary: 190 mg/dL — ABNORMAL HIGH (ref 65–99)

## 2016-11-05 LAB — BASIC METABOLIC PANEL
ANION GAP: 13 (ref 5–15)
BUN: 39 mg/dL — ABNORMAL HIGH (ref 6–20)
CO2: 33 mmol/L — ABNORMAL HIGH (ref 22–32)
Calcium: 9.1 mg/dL (ref 8.9–10.3)
Chloride: 93 mmol/L — ABNORMAL LOW (ref 101–111)
Creatinine, Ser: 1.39 mg/dL — ABNORMAL HIGH (ref 0.61–1.24)
GFR, EST AFRICAN AMERICAN: 53 mL/min — AB (ref >60)
GFR, EST NON AFRICAN AMERICAN: 46 mL/min — AB (ref >60)
Glucose, Bld: 156 mg/dL — ABNORMAL HIGH (ref 65–99)
Potassium: 4.1 mmol/L (ref 3.5–5.1)
SODIUM: 139 mmol/L (ref 135–145)

## 2016-11-05 LAB — PROTIME-INR
INR: 2.34
Prothrombin Time: 26.1 seconds — ABNORMAL HIGH (ref 11.4–15.2)

## 2016-11-05 MED ORDER — NYSTATIN 100000 UNIT/ML MT SUSP
5.0000 mL | Freq: Four times a day (QID) | OROMUCOSAL | Status: DC
  Administered 2016-11-05 – 2016-11-07 (×8): 500000 [IU] via ORAL
  Filled 2016-11-05 (×8): qty 5

## 2016-11-05 MED ORDER — PREDNISONE 10 MG (21) PO TBPK: 10.0000 mg | ORAL_TABLET | Freq: Four times a day (QID) | ORAL | Status: DC

## 2016-11-05 MED ORDER — METOPROLOL SUCCINATE ER 25 MG PO TB24
12.5000 mg | ORAL_TABLET | Freq: Once | ORAL | Status: AC
Start: 2016-11-05 — End: 2016-11-05
  Administered 2016-11-05: 12.5 mg via ORAL
  Filled 2016-11-05: qty 1

## 2016-11-05 MED ORDER — ATORVASTATIN CALCIUM 10 MG PO TABS
10.0000 mg | ORAL_TABLET | Freq: Every day | ORAL | Status: DC
  Administered 2016-11-05 – 2016-11-06 (×2): 10 mg via ORAL
  Filled 2016-11-05 (×2): qty 1

## 2016-11-05 MED ORDER — PREDNISONE 10 MG (21) PO TBPK
10.0000 mg | ORAL_TABLET | ORAL | Status: AC
  Administered 2016-11-05: 10 mg via ORAL

## 2016-11-05 MED ORDER — PREDNISONE 10 MG (21) PO TBPK
20.0000 mg | ORAL_TABLET | Freq: Every evening | ORAL | Status: AC
  Administered 2016-11-06: 20 mg via ORAL

## 2016-11-05 MED ORDER — PREDNISONE 10 MG (21) PO TBPK
20.0000 mg | ORAL_TABLET | Freq: Every evening | ORAL | Status: AC
  Administered 2016-11-05: 20 mg via ORAL

## 2016-11-05 MED ORDER — METOPROLOL SUCCINATE ER 25 MG PO TB24
37.5000 mg | ORAL_TABLET | Freq: Every day | ORAL | Status: DC
  Administered 2016-11-06: 37.5 mg via ORAL
  Filled 2016-11-05 (×3): qty 2

## 2016-11-05 MED ORDER — PREDNISONE 10 MG (21) PO TBPK
10.0000 mg | ORAL_TABLET | Freq: Three times a day (TID) | ORAL | Status: AC
  Administered 2016-11-06 (×3): 10 mg via ORAL

## 2016-11-05 MED ORDER — PREDNISONE 10 MG (21) PO TBPK
20.0000 mg | ORAL_TABLET | Freq: Every morning | ORAL | Status: AC
  Administered 2016-11-05: 20 mg via ORAL
  Filled 2016-11-05: qty 21

## 2016-11-05 NOTE — Progress Notes (Signed)
[facility] Progress Note  Patient: William Braun [id number]   PCP: Leeroy Cha, MD DOB: 1932/10/03   DOA: 10/29/2016   DOS: 11/05/2016   Date of Service: the patient was seen and examined on 11/05/2016   Subjective: Breathing is better even lying down. No chest pain abdominal pain. Still significantly short of breath clinically on examination.  Brief hospital course: Pt. with PMH of CHF, HTN, type II DM, A. fib on Coumadin; admitted on 10/29/2016, with complaint of infusion and shortness of breath, was found to have UTI. Appears to have acute on chronic diastolic CHF with acute hypoxic respiratory failure. Possible pneumonia also cannot be ruled out. Currently further plan is continue diuresis and monitor Respiratory function.  Assessment and Plan: 1 Acute encephalopathy- UTI, Resolved Patient presented with confusion, which family states is different than his baseline mental status. Elevated BUN and other metabolic disturbances such as hypokalemia are likely contributing.  Patient's urinalysis also raises the suspicion for urinary tract infection,  Empirically treated for complicated urinary tract infection. Repeat CXR today shows possible pneumonia. Continue oral Keflex, finish 7 days course. Last day 11/05/2016. Blood cultures negative To date, urine culture no significant growth  2 acute on chronic hypoxic respiratory failure. Acute on chronic diastolic CHF. UnSpecified pulmonary hypertension. History of COPD on 3 L of oxygen at home, OSA on C Pap. Given aggressive volume resuscitation in the emergency department,  Respiratory distress improved after Lasix, renal function also improved. change IV Lasix to oral torsemide 40 mg twice a day. Continue diuresis, Stop BiPAP. ABG on admission shows primary respiratory alkalosis with respiratory acidosis. C Pap daily at bedtime. Continue DuoNeb's, started on Solu-Medrol every 12 hours. I would change to oral  prednisone On 4LPM,Continue incentive spirometry. Wean to home oxygen 3 L. Patient still significantly short of breath, at risk for deterioration, requires close monitoring in stepdown overnight.  3 Diabetes mellitus type 2 Patient takes Lantus at home. Will continue Lantus and add sliding scale insulin for glycemic control. Hemoglobin A1c 7.7, uncontrolled.  4. Leukocytosis. Improving History of CLL. Patient presented with leukocytosis concerning for severe sepsis although patient has history of CLL, follows up with oncology, notified oncology of admission. Normal IgG levels. D/w oncology Dr Marin Olp, no change in plan.   5.Atrial fibrillation Patient has a long-standing history of atrial fibrillation with elevated chadsVas score. He takes warfarin at home. Rate controlled. Increase to 37.5 mg Toprol daily.  5. Chronic kidney disease stage IV. Hypokalemia Better then baseline  Mild worsening on admission, received IV fluids. Chronic elevation of BUN with acute worsening suspected initially from dehydration. Renal function better with IV Lasix. Continue to watch daily. Replace potassium.  6. Lactic acidosis. Etiology unclear suspected sepsis on admission patient received IV fluids. Lactic acid values near normal.  Bowel regimen: last BM 11/04/2016, bowel regimen continue Diet: cardiac diet DVT Prophylaxis: Warfarin  Advance goals of care discussion: full code  Family Communication: no family was present at bedside, at the time of interview.  Disposition:  Discharge to SNF. Social worker consulted Expected discharge date: 11/07/2016, stabilization of respiratory status and renal function  Consultants: none Procedures: BiPAP  Antibiotics: Anti-infectives    Start     Dose/Rate Route Frequency Ordered Stop   11/02/16 2200  cephALEXin (KEFLEX) capsule 500 mg     500 mg Oral Every 8 hours 11/02/16 1041 11/05/16 2359   10/31/16 1700  piperacillin-tazobactam (ZOSYN) IVPB  3.375 g  Status:  Discontinued  3.375 g 12.5 mL/hr over 240 Minutes Intravenous Every 8 hours 10/31/16 1019 10/31/16 1109   10/31/16 1400  cephALEXin (KEFLEX) capsule 500 mg  Status:  Discontinued     500 mg Oral Every 8 hours 10/31/16 1109 10/31/16 1119   10/31/16 1130  cephALEXin (KEFLEX) capsule 500 mg  Status:  Discontinued     500 mg Oral Every 12 hours 10/31/16 1119 11/02/16 1041   10/31/16 1100  piperacillin-tazobactam (ZOSYN) IVPB 3.375 g  Status:  Discontinued     3.375 g 100 mL/hr over 30 Minutes Intravenous  Once 10/31/16 1019 10/31/16 1109   10/30/16 2200  cefTRIAXone (ROCEPHIN) 2 g in dextrose 5 % 50 mL IVPB  Status:  Discontinued     2 g 100 mL/hr over 30 Minutes Intravenous Every 24 hours 10/30/16 0219 10/31/16 1006   10/30/16 0230  cefTRIAXone (ROCEPHIN) 1 g in dextrose 5 % 50 mL IVPB  Status:  Discontinued     1 g 100 mL/hr over 30 Minutes Intravenous  Once 10/30/16 0219 10/30/16 0229   10/29/16 2145  cefTRIAXone (ROCEPHIN) 1 g in dextrose 5 % 50 mL IVPB     1 g 100 mL/hr over 30 Minutes Intravenous  Once 10/29/16 2130 10/29/16 2227      Objective: Physical Exam: Vitals:   11/05/16 0923 11/05/16 1249 11/05/16 1413 11/05/16 1700  BP: (!) 158/75 105/76  (!) 164/108  Pulse: 89 80  87  Resp: 19 (!) 27  (!) 30  Temp:  98.4 F (36.9 C)  97.4 F (36.3 C)  TempSrc:  Oral  Oral  SpO2: 96% 97% 100% 97%  Weight:      Height:        Intake/Output Summary (Last 24 hours) at 11/05/16 1708 Last data filed at 11/05/16 [id number]  Gross per 24 hour  Intake              360 ml  Output              626 ml  Net             -266 ml   Filed Weights   11/04/16 1000 11/05/16 0500  Weight: 117.9 kg (259 lb 14.4 oz) 120.2 kg (264 lb 15.9 oz)    General: Alert, Awake and Oriented to Time, Place and Person. Appear in mild distress, Eyes: PERRL, Conjunctiva normal ENT: Oral Mucosa clear moist. Neck: no JVD, no Abnormal Mass Or lumps Cardiovascular: S1 and S2 Present, no  Murmur, Respiratory: Bilateral Air entry equal and Decreased, no use of accessory muscle, basal Crackles, no wheezes Abdomen: Bowel Sound present, Soft and no tenderness Skin: no redness, no Rash, no induration Extremities: trace Pedal edema, no calf tenderness Neurologic: Grossly no focal neuro deficit. Bilaterally Equal motor strength  Data Reviewed: CBC:  Recent Labs Lab 10/29/16 2038  10/31/16 0226 11/01/16 0224 11/02/16 0222 11/03/16 0202 11/04/16 0250 11/05/16 0337  WBC 38.6*  < > 35.5* 25.9* 21.4* 24.5* 24.2* 25.9*  NEUTROABS 16.6*  --  17.8* 11.1*  --   --   --   --   HGB 16.6  < > 13.4 13.1 12.8* 13.3 13.2 13.8  HCT 47.2  < > 40.0 40.4 38.3* 40.6 40.5 42.2  MCV 89.6  < > 91.1 93.1 93.2 91.9 92.5 91.5  PLT 130*  < > 155 135* 105* 101* 114* 126*  < > = values in this interval not displayed. Basic Metabolic Panel:  Recent Labs Lab 10/30/16 0238 10/31/16  [id number]  11/01/16 0224 11/02/16 0222 11/03/16 0202 11/04/16 0250 11/05/16 0337  NA 134* 141  < > 142 143 142 143 139  K 3.8 3.3*  < > 3.1* 3.6 3.0* 4.6 4.1  CL 83* 92*  < > 90* 90* 93* 94* 93*  CO2 33* 35*  < > 40* 43* 38* 39* 33*  GLUCOSE 376* 157*  < > 151* 138* 113* 134* 156*  BUN 105* 94*  < > 70* 49* 45* 39* 39*  CREATININE 2.21* 1.84*  < > 1.55* 1.38* 1.27* 1.47* 1.39*  CALCIUM 8.5* 8.7*  < > 9.3 9.2 9.0 9.1 9.1  MG 1.9 2.2  --  2.4 2.1  --   --   --   PHOS 4.6  --   --   --   --   --   --   --   < > = values in this interval not displayed.  Liver Function Tests:  Recent Labs Lab 10/29/16 2038 10/30/16 0238 10/31/16 0226 11/01/16 0224  AST 32 31 26 22   ALT 18 18 17  16*  ALKPHOS 79 61 60 57  BILITOT 1.6* 1.3* 0.8 0.9  PROT 7.6 6.2* 6.5 6.4*  ALBUMIN 3.2* 2.5* 2.7* 2.9*   No results for input(s): LIPASE, AMYLASE in the last 168 hours.  Recent Labs Lab 10/30/16 0903  AMMONIA 19   Coagulation Profile:  Recent Labs Lab 11/01/16 0224 11/02/16 0222 11/03/16 0202 11/04/16 0250  11/05/16 0337  INR 2.28 2.85 2.65 2.47 2.34   Cardiac Enzymes: No results for input(s): CKTOTAL, CKMB, CKMBINDEX, TROPONINI in the last 168 hours. BNP (last 3 results) No results for input(s): PROBNP in the last 8760 hours.  CBG:  Recent Labs Lab 11/04/16 1118 11/04/16 1608 11/04/16 2158 11/05/16 0831 11/05/16 1248  GLUCAP 183* 185* 249* 191* 254*    Studies: No results found.   Scheduled Meds: . atorvastatin  10 mg Oral q1800  . cephALEXin  500 mg Oral Q8H  . chlorhexidine  15 mL Mouth Rinse BID  . insulin aspart  0-15 Units Subcutaneous TID WC  . insulin aspart  0-5 Units Subcutaneous QHS  . insulin glargine  10 Units Subcutaneous Q2200  . ipratropium-albuterol  3 mL Nebulization TID  . lidocaine  1 patch Transdermal Q24H  . mouth rinse  15 mL Mouth Rinse q12n4p  . metoprolol succinate  25 mg Oral Daily  . mometasone-formoterol  2 puff Inhalation BID  . nystatin  5 mL Oral QID  . polyethylene glycol  17 g Oral Daily  . potassium chloride  20 mEq Oral BID  . predniSONE  10 mg Oral PC supper  . [START ON 11/06/2016] predniSONE  10 mg Oral 3 x daily with food  . [START ON 11/07/2016] predniSONE  10 mg Oral 4X daily taper  . predniSONE  20 mg Oral Nightly  . [START ON 11/06/2016] predniSONE  20 mg Oral Nightly  . senna-docusate  1 tablet Oral BID  . torsemide  40 mg Oral BID  . warfarin  2.5 mg Oral q1800  . Warfarin - Pharmacist Dosing Inpatient   Does not apply q1800   Continuous Infusions:  PRN Meds: albuterol, ALPRAZolam, alum & mag hydroxide-simeth, bisacodyl, ondansetron  Time spent: 30 minutes  Author: Berle Mull, MD [facility] Pager: [phone] 11/05/2016 5:08 PM  If 7PM-7AM, please contact night-coverage at [email or web address], password [facility] [email or web address]

## 2016-11-05 NOTE — Progress Notes (Signed)
[place] for coumadin Indication: atrial fibrillation    Assessment: 83 yo male her with HF and UTI with history of afib on coumadin PTA. Pharmacy consulted to dose coumadin.  -INR= 2.3  Home coumadin dose: 2.5mg  po daily  Goal of Therapy:  INR 2-3 Monitor platelets by anticoagulation protocol: Yes   Plan:   -Continue coumadin 2.5mg  po daily -Daily PT/INR    Allergies  Allergen Reactions  . Quinine Palpitations and Other (See Comments)    Seizure      Vital Signs: Temp: 97 F (36.1 C) (02/17 0439) Temp Source: Axillary (02/17 0439) BP: 123/81 (02/17 0439) Pulse Rate: 76 (02/17 0439)  Labs:  Recent Labs  11/03/16 0202 11/04/16 0250 11/05/16 0337  HGB 13.3 13.2 13.8  HCT 40.6 40.5 42.2  PLT 101* 114* 126*  LABPROT 28.8* 27.2* 26.1*  INR 2.65 2.47 2.34  CREATININE 1.27* 1.47* 1.39*    Estimated Creatinine Clearance: 57.4 mL/min (by C-G formula based on SCr of 1.39 mg/dL (H)).   Medical History: Past Medical History:  Diagnosis Date  . Atrial fibrillation ([place])   . Cerebrovascular disease   . CHF (congestive heart failure) ([place])   . CLL (chronic lymphocytic leukemia) ([place]) 11/17/2014  . COPD (chronic obstructive pulmonary disease) ([place])   . History of thrombocytopenia   . Obesity   . Osteoarthritis   . Other and unspecified hyperlipidemia   . Type II or unspecified type diabetes mellitus without mention of complication, not stated as uncontrolled   . Unspecified essential hypertension    Erin Hearing PharmD., BCPS Clinical Pharmacist Pager [phone] 11/05/2016 8:16 AM

## 2016-11-06 LAB — CBC
HEMATOCRIT: 41.1 % (ref 39.0–52.0)
HEMOGLOBIN: 13.6 g/dL (ref 13.0–17.0)
MCH: 30.3 pg (ref 26.0–34.0)
MCHC: 33.1 g/dL (ref 30.0–36.0)
MCV: 91.5 fL (ref 78.0–100.0)
Platelets: 135 10*3/uL — ABNORMAL LOW (ref 150–400)
RBC: 4.49 MIL/uL (ref 4.22–5.81)
RDW: 18.1 % — AB (ref 11.5–15.5)
WBC: 27.9 10*3/uL — AB (ref 4.0–10.5)

## 2016-11-06 LAB — BASIC METABOLIC PANEL
ANION GAP: 10 (ref 5–15)
BUN: 43 mg/dL — ABNORMAL HIGH (ref 6–20)
CHLORIDE: 95 mmol/L — AB (ref 101–111)
CO2: 32 mmol/L (ref 22–32)
Calcium: 8.6 mg/dL — ABNORMAL LOW (ref 8.9–10.3)
Creatinine, Ser: 1.48 mg/dL — ABNORMAL HIGH (ref 0.61–1.24)
GFR calc non Af Amer: 43 mL/min — ABNORMAL LOW (ref >60)
GFR, EST AFRICAN AMERICAN: 49 mL/min — AB (ref >60)
GLUCOSE: 209 mg/dL — AB (ref 65–99)
Potassium: 4.4 mmol/L (ref 3.5–5.1)
Sodium: 137 mmol/L (ref 135–145)

## 2016-11-06 LAB — GLUCOSE, CAPILLARY
GLUCOSE-CAPILLARY: 233 mg/dL — AB (ref 65–99)
Glucose-Capillary: 174 mg/dL — ABNORMAL HIGH (ref 65–99)
Glucose-Capillary: 237 mg/dL — ABNORMAL HIGH (ref 65–99)
Glucose-Capillary: 239 mg/dL — ABNORMAL HIGH (ref 65–99)

## 2016-11-06 MED ORDER — MAGNESIUM SULFATE IN D5W 1-5 GM/100ML-% IV SOLN
1.0000 g | Freq: Once | INTRAVENOUS | Status: AC
  Administered 2016-11-06: 1 g via INTRAVENOUS
  Filled 2016-11-06: qty 100

## 2016-11-06 MED ORDER — HYDROCODONE-ACETAMINOPHEN 5-325 MG PO TABS
1.0000 | ORAL_TABLET | Freq: Once | ORAL | Status: AC
  Administered 2016-11-06: 1 via ORAL
  Filled 2016-11-06: qty 1

## 2016-11-06 MED ORDER — HYDROCODONE-ACETAMINOPHEN 5-325 MG PO TABS
1.0000 | ORAL_TABLET | Freq: Four times a day (QID) | ORAL | Status: DC | PRN
  Administered 2016-11-06 (×2): 1 via ORAL
  Filled 2016-11-06 (×2): qty 1

## 2016-11-06 MED ORDER — GLUCERNA SHAKE PO LIQD
237.0000 mL | Freq: Three times a day (TID) | ORAL | Status: DC
  Administered 2016-11-06 – 2016-11-07 (×2): 237 mL via ORAL

## 2016-11-06 NOTE — Progress Notes (Signed)
[facility] Progress Note  Patient: William Braun [id number]   PCP: Leeroy Cha, MD DOB: Dec 19, 1932   DOA: 10/29/2016   DOS: 11/06/2016   Date of Service: the patient was seen and examined on 11/06/2016   Subjective: Breathing is better, no acute complains.  Brief hospital course: Pt. with PMH of CHF, HTN, type II DM, A. fib on Coumadin; admitted on 10/29/2016, with complaint of infusion and shortness of breath, was found to have UTI. Appears to have acute on chronic diastolic CHF with acute hypoxic respiratory failure. Possible pneumonia also cannot be ruled out. Currently further plan is continue diuresis and monitor Respiratory function.  Assessment and Plan: 1 Acute encephalopathy- UTI, Resolved Patient presented with confusion, which family states is different than his baseline mental status. Elevated BUN and other metabolic disturbances such as hypokalemia are likely contributing.  Patient's urinalysis also raised the suspicion for urinary tract infection. Empirically treated for complicated urinary tract infection. Repeat CXR shows possible pneumonia. oral Keflex, finish 7 days course. Last day 11/05/2016. Blood cultures negative To date, urine culture no significant growth  2 acute on chronic hypoxic respiratory failure. Acute on chronic diastolic CHF. UnSpecified pulmonary hypertension. History of COPD on 3 L of oxygen at home, OSA on C Pap. Given aggressive volume resuscitation in the emergency department,  Respiratory distress improved after Lasix, renal function also improved. Changed IV Lasix to oral torsemide 40 mg twice a day. Continue diuresis, Stop BiPAP. ABG on admission shows primary respiratory alkalosis with respiratory acidosis. C Pap daily at bedtime. Continue DuoNeb's, started on Solu-Medrol every 12 hours. Changed to oral prednisone. On 3LPM, Continue incentive spirometry. Check home o2 evaluation tomorrow. Patient still at risk for  deterioration, requires close monitoring overnight.  3 Diabetes mellitus type 2 Patient takes Lantus at home. Will continue Lantus and add sliding scale insulin for glycemic control. Hemoglobin A1c 7.7, uncontrolled.  4. Leukocytosis. Improving History of CLL. Patient presented with leukocytosis concerning for severe sepsis although patient has history of CLL, follows up with oncology, notified oncology of admission. Normal IgG levels. D/w oncology Dr Marin Olp, no change in plan.   5.Atrial fibrillation, NSVT Patient has a long-standing history of atrial fibrillation with elevated chadsVas score. He takes warfarin at home. Rate controlled. Increase to 37.5 mg Toprol daily. Given IV mg.   5. Chronic kidney disease stage IV. Hypokalemia Better then baseline  Mild worsening on admission, received IV fluids. Chronic elevation of BUN with acute worsening suspected initially from dehydration. Renal function better with IV Lasix. Continue to watch daily. Replace potassium.  6. Lactic acidosis. Etiology unclear suspected sepsis on admission patient received IV fluids. Lactic acid values near normal.  7. Oral thrush  Started on nystatin and now getting better.  Bowel regimen: last BM 11/05/2016, bowel regimen continue Diet: cardiac diet DVT Prophylaxis: Warfarin  Advance goals of care discussion: full code  Family Communication: no family was present at bedside, at the time of interview.  Disposition:  Discharge to SNF. Social worker consulted Expected discharge date: 11/07/2016, stabilization of respiratory status and renal function  Consultants: none Procedures: BiPAP  Antibiotics: Anti-infectives    Start     Dose/Rate Route Frequency Ordered Stop   11/02/16 2200  cephALEXin (KEFLEX) capsule 500 mg     500 mg Oral Every 8 hours 11/02/16 1041 11/05/16 2119   10/31/16 1700  piperacillin-tazobactam (ZOSYN) IVPB 3.375 g  Status:  Discontinued     3.375 g 12.5 mL/hr over 240  Minutes  Intravenous Every 8 hours 10/31/16 1019 10/31/16 1109   10/31/16 1400  cephALEXin (KEFLEX) capsule 500 mg  Status:  Discontinued     500 mg Oral Every 8 hours 10/31/16 1109 10/31/16 1119   10/31/16 1130  cephALEXin (KEFLEX) capsule 500 mg  Status:  Discontinued     500 mg Oral Every 12 hours 10/31/16 1119 11/02/16 1041   10/31/16 1100  piperacillin-tazobactam (ZOSYN) IVPB 3.375 g  Status:  Discontinued     3.375 g 100 mL/hr over 30 Minutes Intravenous  Once 10/31/16 1019 10/31/16 1109   10/30/16 2200  cefTRIAXone (ROCEPHIN) 2 g in dextrose 5 % 50 mL IVPB  Status:  Discontinued     2 g 100 mL/hr over 30 Minutes Intravenous Every 24 hours 10/30/16 0219 10/31/16 1006   10/30/16 0230  cefTRIAXone (ROCEPHIN) 1 g in dextrose 5 % 50 mL IVPB  Status:  Discontinued     1 g 100 mL/hr over 30 Minutes Intravenous  Once 10/30/16 0219 10/30/16 0229   10/29/16 2145  cefTRIAXone (ROCEPHIN) 1 g in dextrose 5 % 50 mL IVPB     1 g 100 mL/hr over 30 Minutes Intravenous  Once 10/29/16 2130 10/29/16 2227      Objective: Physical Exam: Vitals:   11/06/16 0940 11/06/16 0959 11/06/16 1013 11/06/16 1441  BP:  107/64    Pulse:  90    Resp:  20    Temp:  97.6 F (36.4 C)    TempSrc:  Oral    SpO2: 95% 94% 100% 94%  Weight:  117.8 kg (259 lb 11.2 oz)    Height:  6\' 2"  (1.88 m)      Intake/Output Summary (Last 24 hours) at 11/06/16 1452 Last data filed at 11/06/16 0800  Gross per 24 hour  Intake              480 ml  Output             1176 ml  Net             -696 ml   Filed Weights   11/05/16 0500 11/06/16 0306 11/06/16 0959  Weight: 120.2 kg (264 lb 15.9 oz) 117.7 kg (259 lb 7.7 oz) 117.8 kg (259 lb 11.2 oz)    General: Alert, Awake and Oriented to Time, Place and Person. Appear in mild distress, Eyes: PERRL, Conjunctiva normal ENT: Oral Mucosa thrush clearing Neck: no JVD, no Abnormal Mass Or lumps Cardiovascular: S1 and S2 Present, no Murmur, Respiratory: Bilateral Air entry equal  and Decreased, no use of accessory muscle, basal Crackles, no wheezes Abdomen: Bowel Sound present, Soft and no tenderness Skin: no redness, no Rash, no induration Extremities: trace Pedal edema, no calf tenderness Neurologic: Grossly no focal neuro deficit. Bilaterally Equal motor strength  Data Reviewed: CBC:  Recent Labs Lab 10/31/16 0226 11/01/16 0224 11/02/16 0222 11/03/16 0202 11/04/16 0250 11/05/16 0337 11/06/16 0236  WBC 35.5* 25.9* 21.4* 24.5* 24.2* 25.9* 27.9*  NEUTROABS 17.8* 11.1*  --   --   --   --   --   HGB 13.4 13.1 12.8* 13.3 13.2 13.8 13.6  HCT 40.0 40.4 38.3* 40.6 40.5 42.2 41.1  MCV 91.1 93.1 93.2 91.9 92.5 91.5 91.5  PLT 155 135* 105* 101* 114* 126* [id number]*   Basic Metabolic Panel:  Recent Labs Lab 10/31/16 0226  11/01/16 0224 11/02/16 0222 11/03/16 0202 11/04/16 0250 11/05/16 0337 11/06/16 0236  NA 141  < > 142 143 142 143 139 137  K 3.3*  < > 3.1* 3.6 3.0* 4.6 4.1 4.4  CL 92*  < > 90* 90* 93* 94* 93* 95*  CO2 35*  < > 40* 43* 38* 39* 33* 32  GLUCOSE 157*  < > 151* 138* 113* 134* 156* 209*  BUN 94*  < > 70* 49* 45* 39* 39* 43*  CREATININE 1.84*  < > 1.55* 1.38* 1.27* 1.47* 1.39* 1.48*  CALCIUM 8.7*  < > 9.3 9.2 9.0 9.1 9.1 8.6*  MG 2.2  --  2.4 2.1  --   --   --   --   < > = values in this interval not displayed.  Liver Function Tests:  Recent Labs Lab 10/31/16 0226 11/01/16 0224  AST 26 22  ALT 17 16*  ALKPHOS 60 57  BILITOT 0.8 0.9  PROT 6.5 6.4*  ALBUMIN 2.7* 2.9*   No results for input(s): LIPASE, AMYLASE in the last 168 hours. No results for input(s): AMMONIA in the last 168 hours. Coagulation Profile:  Recent Labs Lab 11/01/16 0224 11/02/16 0222 11/03/16 0202 11/04/16 0250 11/05/16 0337  INR 2.28 2.85 2.65 2.47 2.34   Cardiac Enzymes: No results for input(s): CKTOTAL, CKMB, CKMBINDEX, TROPONINI in the last 168 hours. BNP (last 3 results) No results for input(s): PROBNP in the last 8760 hours.  CBG:  Recent  Labs Lab 11/05/16 1248 11/05/16 1659 11/05/16 2124 11/06/16 0820 11/06/16 1116  GLUCAP 254* 190* 191* 174* 233*    Studies: No results found.   Scheduled Meds: . atorvastatin  10 mg Oral q1800  . chlorhexidine  15 mL Mouth Rinse BID  . insulin aspart  0-15 Units Subcutaneous TID WC  . insulin aspart  0-5 Units Subcutaneous QHS  . insulin glargine  10 Units Subcutaneous Q2200  . ipratropium-albuterol  3 mL Nebulization TID  . lidocaine  1 patch Transdermal Q24H  . mouth rinse  15 mL Mouth Rinse q12n4p  . metoprolol succinate  37.5 mg Oral Daily  . mometasone-formoterol  2 puff Inhalation BID  . nystatin  5 mL Oral QID  . polyethylene glycol  17 g Oral Daily  . potassium chloride  20 mEq Oral BID  . predniSONE  10 mg Oral 3 x daily with food  . [START ON 11/07/2016] predniSONE  10 mg Oral 4X daily taper  . predniSONE  20 mg Oral Nightly  . senna-docusate  1 tablet Oral BID  . torsemide  40 mg Oral BID  . warfarin  2.5 mg Oral q1800  . Warfarin - Pharmacist Dosing Inpatient   Does not apply q1800   Continuous Infusions:  PRN Meds: albuterol, alum & mag hydroxide-simeth, bisacodyl, HYDROcodone-acetaminophen, ondansetron  Time spent: 30 minutes  Author: Berle Mull, MD [facility] Pager: [phone] 11/06/2016 2:52 PM  If 7PM-7AM, please contact night-coverage at [email or web address], password [facility]

## 2016-11-06 NOTE — Progress Notes (Signed)
Patient arrived in the unit accompanied by 2 NT via stretcher. Orientation to the unit given.Cardiac monitoring in placed. Bed alarm activated for safety precaution.  Patient verbalizes understanding.

## 2016-11-06 NOTE — Progress Notes (Signed)
md notified per pt's request for home pain medication.  Pt c/o of left shoulder pain.  Will continue to monitor [facility] T

## 2016-11-07 DIAGNOSIS — R634 Abnormal weight loss: Secondary | ICD-10-CM | POA: Diagnosis not present

## 2016-11-07 DIAGNOSIS — J449 Chronic obstructive pulmonary disease, unspecified: Secondary | ICD-10-CM | POA: Diagnosis not present

## 2016-11-07 DIAGNOSIS — N184 Chronic kidney disease, stage 4 (severe): Secondary | ICD-10-CM | POA: Diagnosis not present

## 2016-11-07 DIAGNOSIS — J9610 Chronic respiratory failure, unspecified whether with hypoxia or hypercapnia: Secondary | ICD-10-CM | POA: Diagnosis not present

## 2016-11-07 DIAGNOSIS — R488 Other symbolic dysfunctions: Secondary | ICD-10-CM | POA: Diagnosis not present

## 2016-11-07 DIAGNOSIS — J209 Acute bronchitis, unspecified: Secondary | ICD-10-CM | POA: Diagnosis not present

## 2016-11-07 DIAGNOSIS — S61201D Unspecified open wound of left index finger without damage to nail, subsequent encounter: Secondary | ICD-10-CM | POA: Diagnosis not present

## 2016-11-07 DIAGNOSIS — E785 Hyperlipidemia, unspecified: Secondary | ICD-10-CM | POA: Diagnosis not present

## 2016-11-07 DIAGNOSIS — M6281 Muscle weakness (generalized): Secondary | ICD-10-CM | POA: Diagnosis not present

## 2016-11-07 DIAGNOSIS — E662 Morbid (severe) obesity with alveolar hypoventilation: Secondary | ICD-10-CM | POA: Diagnosis not present

## 2016-11-07 DIAGNOSIS — E876 Hypokalemia: Secondary | ICD-10-CM | POA: Diagnosis not present

## 2016-11-07 DIAGNOSIS — E1122 Type 2 diabetes mellitus with diabetic chronic kidney disease: Secondary | ICD-10-CM | POA: Diagnosis not present

## 2016-11-07 DIAGNOSIS — I5033 Acute on chronic diastolic (congestive) heart failure: Secondary | ICD-10-CM | POA: Diagnosis not present

## 2016-11-07 DIAGNOSIS — C9510 Chronic leukemia of unspecified cell type not having achieved remission: Secondary | ICD-10-CM | POA: Diagnosis not present

## 2016-11-07 DIAGNOSIS — B370 Candidal stomatitis: Secondary | ICD-10-CM | POA: Diagnosis not present

## 2016-11-07 DIAGNOSIS — I130 Hypertensive heart and chronic kidney disease with heart failure and stage 1 through stage 4 chronic kidney disease, or unspecified chronic kidney disease: Secondary | ICD-10-CM | POA: Diagnosis not present

## 2016-11-07 DIAGNOSIS — E119 Type 2 diabetes mellitus without complications: Secondary | ICD-10-CM | POA: Diagnosis not present

## 2016-11-07 DIAGNOSIS — Z7901 Long term (current) use of anticoagulants: Secondary | ICD-10-CM | POA: Diagnosis not present

## 2016-11-07 DIAGNOSIS — J9611 Chronic respiratory failure with hypoxia: Secondary | ICD-10-CM | POA: Diagnosis not present

## 2016-11-07 DIAGNOSIS — R590 Localized enlarged lymph nodes: Secondary | ICD-10-CM | POA: Diagnosis not present

## 2016-11-07 DIAGNOSIS — J411 Mucopurulent chronic bronchitis: Secondary | ICD-10-CM | POA: Diagnosis not present

## 2016-11-07 DIAGNOSIS — G4733 Obstructive sleep apnea (adult) (pediatric): Secondary | ICD-10-CM | POA: Diagnosis not present

## 2016-11-07 DIAGNOSIS — S70212D Abrasion, left hip, subsequent encounter: Secondary | ICD-10-CM | POA: Diagnosis not present

## 2016-11-07 DIAGNOSIS — J440 Chronic obstructive pulmonary disease with acute lower respiratory infection: Secondary | ICD-10-CM | POA: Diagnosis not present

## 2016-11-07 DIAGNOSIS — I4891 Unspecified atrial fibrillation: Secondary | ICD-10-CM | POA: Diagnosis not present

## 2016-11-07 DIAGNOSIS — I482 Chronic atrial fibrillation: Secondary | ICD-10-CM | POA: Diagnosis not present

## 2016-11-07 DIAGNOSIS — M1712 Unilateral primary osteoarthritis, left knee: Secondary | ICD-10-CM | POA: Diagnosis not present

## 2016-11-07 LAB — GLUCOSE, CAPILLARY
GLUCOSE-CAPILLARY: 246 mg/dL — AB (ref 65–99)
GLUCOSE-CAPILLARY: 238 mg/dL — AB (ref 65–99)

## 2016-11-07 LAB — PROTIME-INR
INR: 2.31
PROTHROMBIN TIME: 25.8 s — AB (ref 11.4–15.2)

## 2016-11-07 MED ORDER — NYSTATIN 100000 UNIT/ML MT SUSP: 5.0000 mL | Freq: Four times a day (QID) | OROMUCOSAL | 0 refills | Status: AC

## 2016-11-07 MED ORDER — POLYETHYLENE GLYCOL 3350 17 G PO PACK: 17.0000 g | PACK | Freq: Every day | ORAL | 0 refills | Status: AC

## 2016-11-07 MED ORDER — INSULIN GLARGINE 100 UNIT/ML SOLOSTAR PEN: 18.0000 [IU] | PEN_INJECTOR | Freq: Every day | SUBCUTANEOUS | 0 refills | Status: DC

## 2016-11-07 MED ORDER — SENNOSIDES-DOCUSATE SODIUM 8.6-50 MG PO TABS: 1.0000 | ORAL_TABLET | Freq: Every evening | ORAL | 0 refills | Status: AC | PRN

## 2016-11-07 MED ORDER — ALUM & MAG HYDROXIDE-SIMETH 200-200-20 MG/5ML PO SUSP: 30.0000 mL | ORAL | 0 refills | Status: DC | PRN

## 2016-11-07 MED ORDER — METOPROLOL SUCCINATE ER 50 MG PO TB24
50.0000 mg | ORAL_TABLET | Freq: Every day | ORAL | Status: DC
  Administered 2016-11-07: 50 mg via ORAL

## 2016-11-07 MED ORDER — PREDNISONE 10 MG PO TABS
10.0000 mg | ORAL_TABLET | Freq: Three times a day (TID) | ORAL | Status: DC
  Administered 2016-11-07: 10 mg via ORAL
  Filled 2016-11-07: qty 1

## 2016-11-07 MED ORDER — PREDNISONE 10 MG PO TABS: 10.0000 mg | ORAL_TABLET | Freq: Three times a day (TID) | ORAL | Status: DC

## 2016-11-07 MED ORDER — GLUCERNA SHAKE PO LIQD: 237.0000 mL | Freq: Three times a day (TID) | ORAL | 0 refills | Status: DC

## 2016-11-07 MED ORDER — ATORVASTATIN CALCIUM 10 MG PO TABS: 10.0000 mg | ORAL_TABLET | Freq: Every day | ORAL | 0 refills | Status: AC

## 2016-11-07 MED ORDER — TORSEMIDE 20 MG PO TABS: 40.0000 mg | ORAL_TABLET | Freq: Two times a day (BID) | ORAL | 0 refills | Status: DC

## 2016-11-07 MED ORDER — POTASSIUM CHLORIDE CRYS ER 20 MEQ PO TBCR: 20.0000 meq | EXTENDED_RELEASE_TABLET | Freq: Two times a day (BID) | ORAL | 0 refills | Status: DC

## 2016-11-07 MED ORDER — INSULIN GLARGINE 100 UNIT/ML SOLOSTAR PEN: 12.0000 [IU] | PEN_INJECTOR | Freq: Every day | SUBCUTANEOUS | 0 refills | Status: DC

## 2016-11-07 MED ORDER — PREDNISONE 10 MG PO TABS: 10.0000 mg | ORAL_TABLET | Freq: Every day | ORAL | Status: DC

## 2016-11-07 MED ORDER — METOPROLOL SUCCINATE ER 25 MG PO TB24: 50.0000 mg | ORAL_TABLET | Freq: Every day | ORAL | 0 refills | Status: DC

## 2016-11-07 MED ORDER — HYDROCODONE-ACETAMINOPHEN 5-325 MG PO TABS: 1.0000 | ORAL_TABLET | Freq: Four times a day (QID) | ORAL | 0 refills | Status: DC | PRN

## 2016-11-07 MED ORDER — PREDNISONE 10 MG PO TABS: ORAL_TABLET | ORAL | 0 refills | Status: DC

## 2016-11-07 MED ORDER — PREDNISONE 10 MG PO TABS: 10.0000 mg | ORAL_TABLET | Freq: Two times a day (BID) | ORAL | Status: DC

## 2016-11-07 MED ORDER — LIDOCAINE 5 % EX PTCH: 1.0000 | MEDICATED_PATCH | CUTANEOUS | 0 refills | Status: DC

## 2016-11-07 NOTE — Progress Notes (Signed)
PT Cancellation Note  Patient Details Name: William Braun MRN: [id number] DOB: May 08, 1933   Cancelled Treatment:    Reason Eval/Treat Not Completed: Other (comment). Pt declining Rx this AM due to plan to d/c to SNF today.    Lorriane Shire 11/07/2016, 9:44 AM

## 2016-11-07 NOTE — Clinical Social Work Placement (Signed)
   CLINICAL SOCIAL WORK PLACEMENT  NOTE  Date:  11/07/2016  Patient Details  Name: William Braun MRN: [id number] Date of Birth: 1933/06/13  Clinical Social Work is seeking post-discharge placement for this patient at the [place] level of care (*CSW will initial, date and re-position this form in  chart as items are completed):  Yes   Patient/family provided with [place] Work Department's list of facilities offering this level of care within the geographic area requested by the patient (or if unable, by the patient's family).  Yes   Patient/family informed of their freedom to choose among providers that offer the needed level of care, that participate in Medicare, Medicaid or managed care program needed by the patient, have an available bed and are willing to accept the patient.  Yes   Patient/family informed of [facility]'s ownership interest in [facility] and [facility], as well as of the fact that they are under no obligation to receive care at these facilities.  PASRR submitted to EDS on       PASRR number received on       Existing PASRR number confirmed on 11/02/16     FL2 transmitted to all facilities in geographic area requested by pt/family on 11/02/16     FL2 transmitted to all facilities within larger geographic area on       Patient informed that his/her managed care company has contracts with or will negotiate with certain facilities, including the following:        Yes   Patient/family informed of bed offers received.  Patient chooses bed at [facility], [place]     Physician recommends and patient chooses bed at      Patient to be transferred to [facility], [place] on 11/07/16.  Patient to be transferred to facility by Wife's car     Patient family notified on 11/07/16 of transfer.  Name of family member notified:  Leda Quail     PHYSICIAN Please prepare prescriptions     Additional Comment:     _______________________________________________ Candie Chroman, LCSW 11/07/2016, 12:24 PM

## 2016-11-07 NOTE — Consult Note (Addendum)
[place] Nurse wound consult note Reason for Consult: Consult requested for right knee; partial thickness abrasion.  This is NOT a pressure injury and was present prior to admission according to the patient Measurement: .8X.8X.1cm Wound bed: pink and moist Drainage (amount, consistency, odor) Removed mod amt tan drainage and old scabbed skin, revealing 100% pink dry partial thickness wound Dressing procedure/placement/frequency: Band-Aid applied to protect and promote healing.  Discussed plan of care with patient and he verbalized understanding. Please re-consult if further assistance is needed.  Thank-you,  Julien Girt MSN, [place], [place], [place], [place]

## 2016-11-07 NOTE — Progress Notes (Signed)
Attempted to call report to SNF put on hold and no one came to the phone after pt had left .  Karie Kirks,  Therapist, sports.

## 2016-11-07 NOTE — Discharge Summary (Addendum)
[facility] Discharge Summary   Patient: William Braun [id number]   PCP: Leeroy Cha, MD DOB: 1932-10-18   Date of admission: 10/29/2016   Date of discharge:  11/07/2016    Discharge Diagnoses:  Active Problems:   HLD (hyperlipidemia)   Morbid obesity (HCC)   Obesity hypoventilation syndrome (HCC)   Essential hypertension   ATRIAL FIBRILLATION   Chronic respiratory failure (HCC)   CLL (chronic lymphocytic leukemia) (HCC)   Acute on chronic respiratory failure with hypoxia (HCC)   Hypokalemia   Complicated UTI (urinary tract infection)   Pressure injury of skin   Acute on chronic diastolic CHF (congestive heart failure) ([place])   Admitted From: home Disposition:  SNF with oxygen  Recommendations for Outpatient Follow-up:  1. Please maintain follow up as Recommended, need a BMP check in 1 week and cardiology follow up in 1 week for CHF   Follow-up Information    Leeroy Cha, MD. Schedule an appointment as soon as possible for a visit in 1 week(s).   Specialty:  Internal Medicine Why:  with BMP. Contact information: [address] [place] [address] [phone]        Volanda Napoleon, MD. Schedule an appointment as soon as possible for a visit in 2 week(s).   Specialty:  Oncology Why:  missed appointment Contact information: [place] [address] [phone]        [name], MD. Schedule an appointment as soon as possible for a visit in 1 week(s).   Specialty:  Cardiology Why:  for CHF medication adjustment.  Contact information: [place] [address] [phone]          Diet recommendation: cardiac and carb modified diet  Activity: The patient is advised to gradually reintroduce usual activities.  Discharge Condition: good  Code Status: full code  History of present illness: As per the H and P dictated on admission, "[name] is a 83 year old  Caucasian male with past medical history significant for right-sided congestive heart failure, essential hypertension, hyperlipidemia, diabetes mellitus type 2, atrial fibrillation on warfarin therapy. Patient presents to the emergency department on 2/10 complaining of confusion. He is accompanied by his wife and son, who also provided substantial part of the history. Of note, patient was recently hospitalized and treated for acute hypoxic respiratory failure, acute exacerbation of diastolic heart failure, and healthcare acquired pneumonia. He reports patient made a recovery from the pneumonia and recently completed the prednisone. Over the past 3 days, family notes dark brown urine and poor oral intake. Patient denies having any appetite. No fever chills at home. Patient denies any respiratory symptoms such as cough, fever, shortness of breath. No recent change in medications. Patient is otherwise been in his normal state of health."  Hospital Course:   Summary of his active problems in the hospital is as following. 1 Acute encephalopathy- UTI, Resolved Patient presented with confusion, which family states is different than his baseline mental status. Elevated BUN and other metabolic disturbances such as hypokalemia are likely contributing.  Patient's urinalysis also raised the suspicion for urinary tract infection. Empirically treated for complicated urinary tract infection. Repeat CXR shows possible pneumonia. oral Keflex, finish 7 days course. Last day 11/05/2016. Blood cultures negative To date, urine culture no significant growth  2 acute on chronic hypoxic respiratory failure. Acute on chronic diastolic CHF. UnSpecified pulmonary hypertension. History of COPD on 3 L of oxygen at home, OSA on C Pap.  Was Given aggressive volume resuscitation in the emergency department. Respiratory distress improved after Lasix, renal function also improved. Changed IV Lasix to oral torsemide 40 mg twice a  day. No volume overload, will continue this dose for now.  May need to go back on torsemide 80 bid that he was taking before admission in 1 week and also may need PRN zaroxolyn. ABG on admission shows primary respiratory alkalosis with respiratory acidosis. C Pap daily at bedtime. Complete rapid taper of prednisone. On 3LPM, Continue incentive spirometry. Follow up with pulmonary as scheduled on 11/10/2016 and cardiology in 1 week.  3 Diabetes mellitus type 2 Patient takes Lantus 20 units at home.  Will continue 18 units Lantus and may need to adjust at SNF. Hemoglobin A1c 7.7, uncontrolled.  4. Leukocytosis. Improving History of CLL. Patient presented with leukocytosis concerning for severe sepsis although patient has history of CLL, follows up with oncology, notified oncology of admission. Normal IgG levels. D/w oncology Dr Marin Olp, no change in plan. Follow up as an outpatient  5.Atrial fibrillation, PVC Patient has a long-standing history of atrial fibrillation with elevated chadsVas score. He takes warfarin at home. Continue. Rate controlled. Increase to 50 mg Toprol daily.  5. Chronic kidney disease stage IV. Hypokalemia Better then baseline  Mild worsening on admission, received IV fluids. Chronic elevation of BUN with acute worsening suspected initially from dehydration. Renal function better with IV Lasix. Recheck in 1 week.  6. Lactic acidosis. Etiology unclear suspected sepsis on admission patient received IV fluids. Lactic acid values near normal.  7. Oral thrush  Started on nystatin and now getting better.   8. Left Full thickness wound to tip of finger; 1X1.2X.1cm POA Aquacel to absorb drainage and provide antimicrobial benefits.  Foam dressing to protect from further injury.  9. Abrasion left buttock POA Foam dressing   All other chronic medical condition were stable during the hospitalization.  Patient was seen by physical therapy, who recommended  SNF, which was arranged by Education officer, museum and case Freight forwarder. On the day of the discharge the patient's vitals were stable, and no other acute medical condition were reported by patient. the patient was felt safe to be discharge at SNF with close follow up.  Procedures and Results:  bipap    Consultations:  none  DISCHARGE MEDICATION: Current Discharge Medication List    START taking these medications   Details  alum & mag hydroxide-simeth (MAALOX/MYLANTA) 200-200-20 MG/5ML suspension Take 30 mLs by mouth as needed for indigestion or heartburn. Qty: 355 mL, Refills: 0    atorvastatin (LIPITOR) 10 MG tablet Take 1 tablet (10 mg total) by mouth daily at 6 PM. Qty: 30 tablet, Refills: 0    feeding supplement, GLUCERNA SHAKE, (GLUCERNA SHAKE) LIQD Take 237 mLs by mouth 3 (three) times daily between meals. Qty: 21 Can, Refills: 0    lidocaine (LIDODERM) 5 % Place 1 patch onto the skin daily. Remove & Discard patch within 12 hours or as directed by MD Qty: 30 patch, Refills: 0    metoprolol succinate (TOPROL-XL) 25 MG 24 hr tablet Take 2 tablets (50 mg total) by mouth daily. Qty: 60 tablet, Refills: 0    nystatin (MYCOSTATIN) 100000 UNIT/ML suspension Take 5 mLs (500,000 Units total) by mouth 4 (four) times daily. Qty: 60 mL, Refills: 0    polyethylene glycol (MIRALAX / GLYCOLAX) packet Take 17 g by mouth daily. Qty: 14 each, Refills: 0    predniSONE (DELTASONE) 10 MG tablet Take 40mg  daily for  1 day,Take 30mg  daily for 1day,Take 20mg  daily for 1 day,Take 10mg  daily for 2day, then stop. Qty: 11 tablet, Refills: 0    senna-docusate (SENOKOT-S) 8.6-50 MG tablet Take 1 tablet by mouth at bedtime as needed for mild constipation. Qty: 30 tablet, Refills: 0      CONTINUE these medications which have CHANGED   Details  HYDROcodone-acetaminophen (NORCO/VICODIN) 5-325 MG tablet Take 1 tablet by mouth every 6 (six) hours as needed for moderate pain or severe pain. Qty: 8 tablet, Refills:  0    Insulin Glargine (LANTUS) 100 UNIT/ML Solostar Pen Inject 18 Units into the skin daily at 10 pm. Qty: 15 mL, Refills: 0    potassium chloride SA (K-DUR,KLOR-CON) 20 MEQ tablet Take 1 tablet (20 mEq total) by mouth 2 (two) times daily. Qty: 60 tablet, Refills: 0    torsemide (DEMADEX) 20 MG tablet Take 2 tablets (40 mg total) by mouth 2 (two) times daily. Qty: 60 tablet, Refills: 0      CONTINUE these medications which have NOT CHANGED   Details  albuterol (VENTOLIN HFA) 108 (90 BASE) MCG/ACT inhaler Inhale 2 puffs into the lungs every 6 (six) hours as needed for wheezing or shortness of breath. Qty: 3 Inhaler, Refills: 1    budesonide-formoterol (SYMBICORT) 160-4.5 MCG/ACT inhaler Inhale 2 puffs into the lungs 2 (two) times daily. 2 puffs twice daily Qty: 3 Inhaler, Refills: 3    glimepiride (AMARYL) 4 MG tablet Take 8 mg by mouth daily with breakfast.    ipratropium-albuterol (DUONEB) 0.5-2.5 (3) MG/3ML SOLN Take 3 mLs by nebulization every 6 (six) hours as needed. Qty: 360 mL, Refills: 6    nitroGLYCERIN (NITROSTAT) 0.4 MG SL tablet Place 1 tablet (0.4 mg total) under the tongue every 5 (five) minutes as needed for chest pain. Qty: 25 tablet, Refills: 3   Associated Diagnoses: Chronic diastolic heart failure ([place]); Chest pain, unspecified    OXYGEN Inhale 3 L/min into the lungs continuous.     tiotropium (SPIRIVA) 18 MCG inhalation capsule Place 1 capsule (18 mcg total) into inhaler and inhale daily. Qty: 90 capsule, Refills: 3    warfarin (COUMADIN) 2.5 MG tablet Take 2.5 mg by mouth daily at 6 PM.      STOP taking these medications     ciprofloxacin (CIPRO) 250 MG tablet      metolazone (ZAROXOLYN) 2.5 MG tablet      metoprolol tartrate (LOPRESSOR) 25 MG tablet      simvastatin (ZOCOR) 20 MG tablet        Allergies  Allergen Reactions  . Quinine Palpitations and Other (See Comments)    Seizure    Discharge Instructions    Diet - low sodium heart healthy     Complete by:  As directed    Discharge instructions    Complete by:  As directed    It is important that you read following instructions as well as go over your medication list with RN to help you understand your care after this hospitalization.  Discharge Instructions: Please follow-up with PCP in one week  Please request your primary care physician to go over all Hospital Tests and Procedure/Radiological results at the follow up,  Please get all Hospital records sent to your PCP by signing hospital release before you go home.   Do not take more than prescribed Pain, Sleep and Anxiety Medications. You were cared for by a hospitalist during your hospital stay. If you have any questions about your discharge medications or  the care you received while you were in the hospital after you are discharged, you can call the unit and ask to speak with the hospitalist on call if the hospitalist that took care of you is not available.  Once you are discharged, your primary care physician will handle any further medical issues. Please note that NO REFILLS for any discharge medications will be authorized once you are discharged, as it is imperative that you return to your primary care physician (or establish a relationship with a primary care physician if you do not have one) for your aftercare needs so that they can reassess your need for medications and monitor your lab values. You Must read complete instructions/literature along with all the possible adverse reactions/side effects for all the Medicines you take and that have been prescribed to you. Take any new Medicines after you have completely understood and accept all the possible adverse reactions/side effects. Wear Seat belts while driving. If you have smoked or chewed Tobacco in the last 2 yrs please stop smoking and/or stop any Recreational drug use.   Increase activity slowly    Complete by:  As directed      Discharge Exam: Filed Weights    11/06/16 0306 11/06/16 0959 11/07/16 0531  Weight: 117.7 kg (259 lb 7.7 oz) 117.8 kg (259 lb 11.2 oz) 116.8 kg (257 lb 9.6 oz)   Vitals:   11/06/16 2235 11/07/16 0531  BP:  128/83  Pulse: 88 83  Resp: 18 18  Temp:  97.5 F (36.4 C)   General: Appear in no distress, no Rash; Oral Mucosa thrush clearing Cardiovascular: S1 and S2 Present, no Murmur, no JVD Respiratory: Bilateral Air entry present and Clear to Auscultation, no Crackles, no wheezes Abdomen: Bowel Sound present, Soft and no tenderness Extremities: no Pedal edema, no calf tenderness Neurology: Grossly no focal neuro deficit.  The results of significant diagnostics from this hospitalization (including imaging, microbiology, ancillary and laboratory) are listed below for reference.    Significant Diagnostic Studies: Dg Chest 2 View  Result Date: 10/19/2016 CLINICAL DATA:  Chronic worsening shortness of breath. Initial encounter. EXAM: CHEST  2 VIEW COMPARISON:  Chest radiograph performed 09/21/2016 FINDINGS: The lungs are well-aerated. There is elevation of the right hemidiaphragm. Bibasilar atelectasis is noted. There is no evidence of pleural effusion or pneumothorax. The heart is borderline normal in size. No acute osseous abnormalities are seen. IMPRESSION: Elevation of the right hemidiaphragm.  Bibasilar atelectasis noted. Electronically Signed   By: Garald Balding M.D.   On: 10/19/2016 16:21   Dg Chest Port 1 View  Result Date: 11/03/2016 CLINICAL DATA:  Shortness of breath.  CHF . EXAM: PORTABLE CHEST 1 VIEW COMPARISON:  10/20/2016.  10/29/2016.  Chest CT 09/19/2016. FINDINGS: Mediastinal and hilar fullness again noted consistent adenopathy. Stable cardiomegaly. No focal infiltrate. Mild infiltrate left lung base. No pleural effusion or pneumothorax . IMPRESSION: 1. Mediastinum and hilar fullness again noted consistent adenopathy. Similar findings noted on prior CT of 09/19/2016 . 2.  Mild infiltrate left lower lobe  consistent pneumonia . Electronically Signed   By: Marcello Moores  Register   On: 11/03/2016 09:26   Dg Chest Port 1 View  Result Date: 10/31/2016 CLINICAL DATA:  83 year old with acute onset shortness of breath. EXAM: PORTABLE CHEST 1 VIEW COMPARISON:  10/29/2016, 10/19/2016 and earlier, including CT chest 09/19/2016. FINDINGS: Cardiac silhouette moderately to markedly enlarged, unchanged. Mild diffuse interstitial pulmonary edema, new since the examination 2 days ago. No visible pleural effusions. Stable chronic  elevation of the right hemidiaphragm. IMPRESSION: Acute mild CHF, with stable moderate to marked cardiomegaly and new mild interstitial pulmonary edema. Electronically Signed   By: Evangeline Dakin M.D.   On: 10/31/2016 10:29   Dg Chest Portable 1 View  Result Date: 10/29/2016 CLINICAL DATA:  Acute onset of shortness of breath. Altered mental status. Initial encounter. EXAM: PORTABLE CHEST 1 VIEW COMPARISON:  Chest radiograph performed 10/19/2016 FINDINGS: There is elevation of the right hemidiaphragm. There is no evidence of effusion or pneumothorax. The cardiomediastinal silhouette is borderline normal in size. No acute osseous abnormalities are seen. IMPRESSION: Elevation of the right hemidiaphragm.  Lungs remain grossly clear. Electronically Signed   By: Garald Balding M.D.   On: 10/29/2016 20:47    Microbiology: Recent Results (from the past 240 hour(s))  Culture, blood (Routine x 2)     Status: None   Collection Time: 10/29/16  9:00 PM  Result Value Ref Range Status   Specimen Description BLOOD RIGHT HAND  Final   Special Requests IN PEDIATRIC BOTTLE 3CC  Final   Culture NO GROWTH 5 DAYS  Final   Report Status 11/03/2016 FINAL  Final  Culture, blood (Routine x 2)     Status: None   Collection Time: 10/29/16  9:10 PM  Result Value Ref Range Status   Specimen Description BLOOD RIGHT ANTECUBITAL  Final   Special Requests BOTTLES DRAWN AEROBIC ONLY 5CC  Final   Culture NO GROWTH 5 DAYS   Final   Report Status 11/03/2016 FINAL  Final  Culture, Urine     Status: Abnormal   Collection Time: 10/29/16  9:20 PM  Result Value Ref Range Status   Specimen Description URINE, RANDOM  Final   Special Requests ADDED AT [id number] ON [id number]  Final   Culture <10,000 COLONIES/mL INSIGNIFICANT GROWTH (A)  Final   Report Status 10/31/2016 FINAL  Final  MRSA PCR Screening     Status: None   Collection Time: 10/30/16 12:36 PM  Result Value Ref Range Status   MRSA by PCR NEGATIVE NEGATIVE Final    Comment:        The GeneXpert MRSA Assay (FDA approved for NASAL specimens only), is one component of a comprehensive MRSA colonization surveillance program. It is not intended to diagnose MRSA infection nor to guide or monitor treatment for MRSA infections.      Labs: CBC:  Recent Labs Lab 11/01/16 0224 11/02/16 0222 11/03/16 0202 11/04/16 0250 11/05/16 0337 11/06/16 0236  WBC 25.9* 21.4* 24.5* 24.2* 25.9* 27.9*  NEUTROABS 11.1*  --   --   --   --   --   HGB 13.1 12.8* 13.3 13.2 13.8 13.6  HCT 40.4 38.3* 40.6 40.5 42.2 41.1  MCV 93.1 93.2 91.9 92.5 91.5 91.5  PLT 135* 105* 101* 114* 126* [id number]*   Basic Metabolic Panel:  Recent Labs Lab 11/01/16 0224 11/02/16 0222 11/03/16 0202 11/04/16 0250 11/05/16 0337 11/06/16 0236  NA 142 143 142 143 139 137  K 3.1* 3.6 3.0* 4.6 4.1 4.4  CL 90* 90* 93* 94* 93* 95*  CO2 40* 43* 38* 39* 33* 32  GLUCOSE 151* 138* 113* 134* 156* 209*  BUN 70* 49* 45* 39* 39* 43*  CREATININE 1.55* 1.38* 1.27* 1.47* 1.39* 1.48*  CALCIUM 9.3 9.2 9.0 9.1 9.1 8.6*  MG 2.4 2.1  --   --   --   --    Liver Function Tests:  Recent Labs Lab 11/01/16 0224  AST 22  ALT  16*  ALKPHOS 57  BILITOT 0.9  PROT 6.4*  ALBUMIN 2.9*   No results for input(s): LIPASE, AMYLASE in the last 168 hours. No results for input(s): AMMONIA in the last 168 hours. Cardiac Enzymes: No results for input(s): CKTOTAL, CKMB, CKMBINDEX, TROPONINI in the last 168 hours. BNP (last  3 results)  Recent Labs  11/30/15 1153 04/18/16 1000 09/14/16 1857  BNP 56.5 95.4 126.0*   CBG:  Recent Labs Lab 11/06/16 0820 11/06/16 1116 11/06/16 1621 11/06/16 2138 11/07/16 0754  GLUCAP 174* 233* 237* 239* 246*   Time spent: 40 minutes  Signed:  PATEL, PRANAV  [facility]  11/07/2016  , 9:39 AM

## 2016-11-07 NOTE — Progress Notes (Signed)
At 1403 all d/c instructions explained and given to pt and his wife & son  at bedside.  Verbalized understanding. D/c off floor via w/c to awaiting transport.  Karie Kirks, Therapist, sports.

## 2016-11-07 NOTE — Care Management Important Message (Signed)
Important Message  Patient Details  Name: William Braun MRN: [id number] Date of Birth: 12/23/1932   Medicare Important Message Given:  Yes    Erminie Foulks [address] 11/07/2016, 12:40 PM

## 2016-11-07 NOTE — Clinical Social Work Note (Addendum)
CSW confirmed discharge plans with patient, patient's wife via telephone, and [place]. Insurance authorization started with [employer]  Dayton Scrape, [place] [phone]  12:08 pm Insurance authorization obtained: [id number]. SNF and patient's wife notified. CSW submitted prior authorization form for non-emergency ambulance but patient's wife prefers to take patient by car. SNF will bring wheelchair out to the car when he arrives as long as he is safe to transfer. Per nurse tech, patient is safe to transfer, he just has difficulty with steps. Patient's room will be ready in one hour.  Dayton Scrape, [place]

## 2016-11-07 NOTE — Progress Notes (Signed)
.   Noted stage 2 ulcer to Rt knee with bandage dsg intact.  Small old drainage noted on dsg.  Pt stated he had it a month or so ago from hitting knee on a fence driving a tractor.  Dr. Posey Pronto notified this am for wound nurse to assess.  Will continue to monitor.  Karie Kirks, Therapist, sports.

## 2016-11-07 NOTE — Clinical Social Work Note (Signed)
CSW facilitated patient discharge including contacting patient family and facility to confirm patient discharge plans. Clinical information faxed to facility and family agreeable with plan. Patient's wife to transport by car to [facility] after lunch. RN to call report prior to discharge [phone] Room 603A).  CSW will sign off for now as social work intervention is no longer needed. Please consult [place] again if new needs arise.  Dayton Scrape, [place]

## 2016-11-09 ENCOUNTER — Non-Acute Institutional Stay (SKILLED_NURSING_FACILITY): Payer: PPO | Admitting: Internal Medicine

## 2016-11-09 DIAGNOSIS — M1712 Unilateral primary osteoarthritis, left knee: Secondary | ICD-10-CM | POA: Diagnosis not present

## 2016-11-09 DIAGNOSIS — I5033 Acute on chronic diastolic (congestive) heart failure: Secondary | ICD-10-CM

## 2016-11-09 DIAGNOSIS — I482 Chronic atrial fibrillation, unspecified: Secondary | ICD-10-CM

## 2016-11-09 DIAGNOSIS — E1122 Type 2 diabetes mellitus with diabetic chronic kidney disease: Secondary | ICD-10-CM | POA: Diagnosis not present

## 2016-11-09 DIAGNOSIS — J209 Acute bronchitis, unspecified: Secondary | ICD-10-CM

## 2016-11-09 DIAGNOSIS — N184 Chronic kidney disease, stage 4 (severe): Secondary | ICD-10-CM

## 2016-11-09 DIAGNOSIS — J440 Chronic obstructive pulmonary disease with acute lower respiratory infection: Secondary | ICD-10-CM | POA: Diagnosis not present

## 2016-11-09 NOTE — Progress Notes (Signed)
11/09/2016  Facility [facility]  Chief complaint; admission to SNF post stay at [facility] 10/29/16 through 11/07/16  History; this is an 83 year old man with a multitude of medical issues.He was hospitalized from 12/27 hrough 09/22/16 with acute on chronic respiratory failure, COPD acute, diastolic heart failure and left lower lobe pneumonia.He was admitted n this occasion with 3 days of altered mental status with G, poor appetite.The family noted a change in his urine color and poor oral intake. The patient waUTI. Was noted to be slightly dehydrated and hypokalemic. He was empirically treated for a UTI. In addition a chest x-ray suggested possible pneumonia and he was given Keflex to finish on 11/05/16 blood cultures were negative.He was also felt to be having acute on chronic hypoxemic respiratory failure and acute on chronic diastolic heart failure. He received aggressive fluid volume resuscitation in the ER and eventually required Lasix. His IV Lasix was changed to oral torsemide 40 mg twice a day There are instructions that his torsemide may need to be increased back to 80 mg twice a day in one week and also when necessary Zaroxolyn.  Other significant medical issues include a history of CLL with elevated white count and also thrombocytopenia. He has atrial fibrillation on long-standing Coumadin. His rate was controlled.He is felt to have stage IV chronic renal failure with instructions to recheck this in one week. He was also treated for oral thrush.type 2 diabetes on Lantus insulin 18 units at at bedtime  Finally he was noted to have a left index finger wound the tip of his finger with suggestions for Aquacel. He had an abasion of his at I could not see today  BMP Latest Ref Rng & Units 11/06/2016 11/05/2016 11/04/2016  Glucose 65 - 99 mg/dL 209(H) 156(H) 134(H)  BUN 6 - 20 mg/dL 43(H) 39(H) 39(H)  Creatinine 0.61 - 1.24 mg/dL 1.48(H) 1.39(H) 1.47(H)  Sodium 135 - 145 mmol/L 137 139 143    Potassium 3.5 - 5.1 mmol/L 4.4 4.1 4.6  Chloride 101 - 111 mmol/L 95(L) 93(L) 94(L)  CO2 22 - 32 mmol/L 32 33(H) 39(H)  Calcium 8.9 - 10.3 mg/dL 8.6(L) 9.1 9.1    CBC Latest Ref Rng & Units 11/06/2016 11/05/2016 11/04/2016  WBC 4.0 - 10.5 K/uL 27.9(H) 25.9(H) 24.2(H)  Hemoglobin 13.0 - 17.0 g/dL 13.6 13.8 13.2  Hematocrit 39.0 - 52.0 % 41.1 42.2 40.5  Platelets 150 - 400 K/uL 135(L) 126(L) 114(L)    Past Medical History:  Diagnosis Date  . Atrial fibrillation ([place])   . Cerebrovascular disease   . CHF (congestive heart failure) ([place])   . CLL (chronic lymphocytic leukemia) ([place]) 11/17/2014  . COPD (chronic obstructive pulmonary disease) ([place])   . History of thrombocytopenia   . Obesity   . Osteoarthritis   . Other and unspecified hyperlipidemia   . Type II or unspecified type diabetes mellitus without mention of complication, not stated as uncontrolled   . Unspecified essential hypertension    Past Surgical History:  Procedure Laterality Date  . KNEE ARTHROSCOPY W/ ALLOGRAFT IMPANT    . VASECTOMY      Current Outpatient Prescriptions on File Prior to Visit  Medication Sig Dispense Refill  . albuterol (VENTOLIN HFA) 108 (90 BASE) MCG/ACT inhaler Inhale 2 puffs into the lungs every 6 (six) hours as needed for wheezing or shortness of breath. 3 Inhaler 1  . alum & mag hydroxide-simeth (MAALOX/MYLANTA) 200-200-20 MG/5ML suspension Take 30 mLs by mouth as needed for indigestion or  heartburn. 355 mL 0  . atorvastatin (LIPITOR) 10 MG tablet Take 1 tablet (10 mg total) by mouth daily at 6 PM. 30 tablet 0  . budesonide-formoterol (SYMBICORT) 160-4.5 MCG/ACT inhaler Inhale 2 puffs into the lungs 2 (two) times daily. 2 puffs twice daily 3 Inhaler 3  . feeding supplement, GLUCERNA SHAKE, (GLUCERNA SHAKE) LIQD Take 237 mLs by mouth 3 (three) times daily between meals. 21 Can 0  . glimepiride (AMARYL) 4 MG tablet Take 8 mg by mouth daily with breakfast.    . HYDROcodone-acetaminophen  (NORCO/VICODIN) 5-325 MG tablet Take 1 tablet by mouth every 6 (six) hours as needed for moderate pain or severe pain. 8 tablet 0  . Insulin Glargine (LANTUS) 100 UNIT/ML Solostar Pen Inject 18 Units into the skin daily at 10 pm. 15 mL 0  . ipratropium-albuterol (DUONEB) 0.5-2.5 (3) MG/3ML SOLN Take 3 mLs by nebulization every 6 (six) hours as needed. (Patient taking differently: Take 3 mLs by nebulization 2 (two) times daily. ) 360 mL 6  . lidocaine (LIDODERM) 5 % Place 1 patch onto the skin daily. Remove & Discard patch within 12 hours or as directed by MD 30 patch 0  . metoprolol succinate (TOPROL-XL) 25 MG 24 hr tablet Take 2 tablets (50 mg total) by mouth daily. 60 tablet 0  . nitroGLYCERIN (NITROSTAT) 0.4 MG SL tablet Place 1 tablet (0.4 mg total) under the tongue every 5 (five) minutes as needed for chest pain. 25 tablet 3  . nystatin (MYCOSTATIN) 100000 UNIT/ML suspension Take 5 mLs (500,000 Units total) by mouth 4 (four) times daily. 60 mL 0  . OXYGEN Inhale 3 L/min into the lungs continuous.     . polyethylene glycol (MIRALAX / GLYCOLAX) packet Take 17 g by mouth daily. 14 each 0  . potassium chloride SA (K-DUR,KLOR-CON) 20 MEQ tablet Take 1 tablet (20 mEq total) by mouth 2 (two) times daily. 60 tablet 0  . predniSONE (DELTASONE) 10 MG tablet Take 40mg  daily for 1 day,Take 30mg  daily for 1day,Take 20mg  daily for 1 day,Take 10mg  daily for 2day, then stop. 11 tablet 0  . senna-docusate (SENOKOT-S) 8.6-50 MG tablet Take 1 tablet by mouth at bedtime as needed for mild constipation. 30 tablet 0  . tiotropium (SPIRIVA) 18 MCG inhalation capsule Place 1 capsule (18 mcg total) into inhaler and inhale daily. 90 capsule 3  . torsemide (DEMADEX) 20 MG tablet Take 2 tablets (40 mg total) by mouth 2 (two) times daily. 60 tablet 0  . warfarin (COUMADIN) 2.5 MG tablet Take 2.5 mg by mouth daily at 6 PM.       Social reports that he quit smoking about 38 years ago. His smoking use included Cigarettes. He  started smoking about 70 years ago. He has a 68.00 pack-year smoking history. He has never used smokeless tobacco. He reports that he does not drink alcohol.patient tellse he lives at home with his wife he has a wheelchair. Am not sure of his exact functional status. He is on O2 3 L chronically also apparently uses BiPAP at night.  family history includes Heart disease in his father.   Review of systems; Gen;feels his weight is at baseline.no fever HEENT no throat pain or dysphagia Respiratory; no shortness of breath, no cough,states his breathing is at baseline Cardiac; no exertional chest pain, no palpitations GI; no abnormal pain, no change in bowel habits GU; no dysuria, no voiding difficulties Musculoskeletal complaining of pain in both knees states he saw ortho today Neurologic;  he does not complain of focal weakness or numbness. States he ble to walk with a walker Mental status; no depression however patient feels his memory I  Physical examination Gen. Patient lying in bed in absolutely no distress alert and responsive Vitals; he is afebrile O2 sat 95% on 3 L pulse 67 and irregular. Respiratory rate 18 HEENT no oral thrush is seen Respiratory shallow air entry bilaterally but no crackles or wheezes work of breathing  Cardiac heart sounds are irregular there is no murmurs no S3 JVP is borderline at 45 Abdomen; soft no liver no spleen. Small peri-umbilicus hernia that reduces easily GU; bladder is not distended there is no CVA tenderness Extremities; chronic venous insufficiency. Dorsalis pedis posterior tibial pulses are intact. He had arterial studies in 2015 were normal. He has gauze on t I did not remove this. Left index finger also has gauze. There is a small area at the tip of his finger that almost looks like a burn. Topical antibiotic should suffice. Musculoskeletal; osteoarthritis of both knees no effusion Neurologic; no lateralizing findings. No pronator drift. He has some  bilateral hip flexor weakness also hip abductor at 4 out of 5. Mental status; no depression. Some short-term memory loss possible. This attention is well focused suggesting resolution of this acute delirium Gait I did not test  Impression/plan #1 COPD on chronic this seems stable for the moment.he is on a ta of prednisone, Symbicort duo nebs when necessary as well as Spiriva metabolic.status currently seems stable #2 diastolic congestive heart failure. We will need to follow his weights daily,currently on torsemide 40 twice a day with instructions that he may need to be increased to 80 twice a day plus or minus when necessary Zaroxolyn.he appears to be euvolemic we will need to c #3chronic renal failure listed is stage IV related to diabetes #4 cll;with lymphocytosis and thrombocytopenia #5 atrial fibrillation. Heart rate controlled onmetoprolol and Coumadinwill need to check his INR this is not already been done #6 delirium in hospital. This appears to have resolved. He appears to have some short-term memory problems although this may be resolving delirium #7 type 2 diabetes oinsulin hemoglobin A1c was 7.7.currently on Amaryl 4 mg a day and Lantus 18 units a day. Not really in favor o #8 according tohis notes he has obstructive sleep apnea and is on BiPAPI'm not sure we have orders for this.   Really the issue here seems to be his functional status at home and his mobility. I don't have any current information on this. His medical issues seem stable for the moment. He will require lab work next week and an INR of t

## 2016-11-10 ENCOUNTER — Encounter: Payer: Self-pay | Admitting: Pulmonary Disease

## 2016-11-10 ENCOUNTER — Ambulatory Visit (INDEPENDENT_AMBULATORY_CARE_PROVIDER_SITE_OTHER): Payer: PPO | Admitting: Pulmonary Disease

## 2016-11-10 VITALS — BP 102/68 | HR 99 | Ht 74.0 in | Wt 256.0 lb

## 2016-11-10 DIAGNOSIS — J9611 Chronic respiratory failure with hypoxia: Secondary | ICD-10-CM

## 2016-11-10 DIAGNOSIS — J411 Mucopurulent chronic bronchitis: Secondary | ICD-10-CM | POA: Diagnosis not present

## 2016-11-10 DIAGNOSIS — G4733 Obstructive sleep apnea (adult) (pediatric): Secondary | ICD-10-CM | POA: Diagnosis not present

## 2016-11-10 DIAGNOSIS — R590 Localized enlarged lymph nodes: Secondary | ICD-10-CM

## 2016-11-10 DIAGNOSIS — E662 Morbid (severe) obesity with alveolar hypoventilation: Secondary | ICD-10-CM | POA: Diagnosis not present

## 2016-11-10 DIAGNOSIS — R634 Abnormal weight loss: Secondary | ICD-10-CM | POA: Diagnosis not present

## 2016-11-10 NOTE — Patient Instructions (Signed)
Follow up in 3 months

## 2016-11-10 NOTE — Progress Notes (Signed)
Current Outpatient Prescriptions on File Prior to Visit  Medication Sig  . albuterol (VENTOLIN HFA) 108 (90 BASE) MCG/ACT inhaler Inhale 2 puffs into the lungs every 6 (six) hours as needed for wheezing or shortness of breath.  Marland Kitchen alum & mag hydroxide-simeth (MAALOX/MYLANTA) 200-200-20 MG/5ML suspension Take 30 mLs by mouth as needed for indigestion or heartburn.  Marland Kitchen atorvastatin (LIPITOR) 10 MG tablet Take 1 tablet (10 mg total) by mouth daily at 6 PM.  . budesonide-formoterol (SYMBICORT) 160-4.5 MCG/ACT inhaler Inhale 2 puffs into the lungs 2 (two) times daily. 2 puffs twice daily  . feeding supplement, GLUCERNA SHAKE, (GLUCERNA SHAKE) LIQD Take 237 mLs by mouth 3 (three) times daily between meals.  Marland Kitchen glimepiride (AMARYL) 4 MG tablet Take 8 mg by mouth daily with breakfast.  . HYDROcodone-acetaminophen (NORCO/VICODIN) 5-325 MG tablet Take 1 tablet by mouth every 6 (six) hours as needed for moderate pain or severe pain.  . Insulin Glargine (LANTUS) 100 UNIT/ML Solostar Pen Inject 18 Units into the skin daily at 10 pm.  . ipratropium-albuterol (DUONEB) 0.5-2.5 (3) MG/3ML SOLN Take 3 mLs by nebulization every 6 (six) hours as needed. (Patient taking differently: Take 3 mLs by nebulization 2 (two) times daily. )  . lidocaine (LIDODERM) 5 % Place 1 patch onto the skin daily. Remove & Discard patch within 12 hours or as directed by MD  . metoprolol succinate (TOPROL-XL) 25 MG 24 hr tablet Take 2 tablets (50 mg total) by mouth daily.  . nitroGLYCERIN (NITROSTAT) 0.4 MG SL tablet Place 1 tablet (0.4 mg total) under the tongue every 5 (five) minutes as needed for chest pain.  Marland Kitchen nystatin (MYCOSTATIN) 100000 UNIT/ML suspension Take 5 mLs (500,000 Units total) by mouth 4 (four) times daily.  . OXYGEN Inhale 3 L/min into the lungs continuous.   . polyethylene glycol (MIRALAX / GLYCOLAX) packet Take 17 g by mouth daily.  . potassium chloride SA (K-DUR,KLOR-CON) 20 MEQ tablet Take 1 tablet (20 mEq total) by mouth  2 (two) times daily.  . predniSONE (DELTASONE) 10 MG tablet Take 40mg  daily for 1 day,Take 30mg  daily for 1day,Take 20mg  daily for 1 day,Take 10mg  daily for 2day, then stop.  Marland Kitchen senna-docusate (SENOKOT-S) 8.6-50 MG tablet Take 1 tablet by mouth at bedtime as needed for mild constipation.  Marland Kitchen tiotropium (SPIRIVA) 18 MCG inhalation capsule Place 1 capsule (18 mcg total) into inhaler and inhale daily.  Marland Kitchen torsemide (DEMADEX) 20 MG tablet Take 2 tablets (40 mg total) by mouth 2 (two) times daily.  Marland Kitchen warfarin (COUMADIN) 2.5 MG tablet Take 2.5 mg by mouth daily at 6 PM.   No current facility-administered medications on file prior to visit.     Chief Complaint  Patient presents with  . Follow-up    Pt wears VPAP nightly. Denies any issues with mask/pressure. DME: [facility] ; Wears 3 Liters O2 24/7 pulsed through [place]. Pt states that he is much improved since last seen with Judson Roch. Pt was seen in [facility] 10/29/16 for kidney issues and breathing.     Pulmonary tests PFT 2011 >> FEV1 2.04 (66%), FEV1% 65 SNIFF 11/16/12 >> Rt hemidiaphragm paralysis PFT 09/30/13 >> FEV1 1.78 (51%), FEV1% 73, TLC 4.67 (59%), DLCO 49%, no BD CT chest 09/19/16 >> b/l ASD, mediastinal LAN  Cardiac tests Echo 11/21/12 >>  Mod LVH, EF 60 to 65%, mod LA dilation, mod RV dilation, PAS 55 mmHg Echo 09/19/16 >> mod LVH, EF 55 to 60%, mild AS, mod LA dilation, mild/mod TR, PAS  56 mmHg  Sleep tests ONO on BiPAP and 2 liters 11/19/12 >> test time 7 hrs 41 min.  Mean SpO2 93%, low SpO2 64%.  Spent 4 min 12 sec < 88% BiPAP 10/10/16 to 11/08/16 >> used on 21 of 30 nights with average 5 hrs 55 min.  Average AHI 5.5 with Bipap 14/10 cm H2O  Past medical history HTN, HLD, DM, A fib, CVA 2008, CLL  Past surgical history, Family history, Allergies reviewd  Vital signs BP 102/68 (BP Location: Left Arm, Cuff Size: Normal)   Pulse 99   Ht 6\' 2"  (1.88 m)   Wt 256 lb (116.1 kg)   SpO2 98%   BMI 32.87 kg/m    History of Present Illness: William Braun is a 83 y.o. male former smoker with COPD, OSA/OHS, Rt hemidiaphragm paralysis, and chronic respiratory failure.  He was in hospital from 10/29/16 to 11/07/16 with UTI and TME.  Prior to that he was in hospital from 09/14/16 to 09/22/16 with PNA and AECOPD.  He is staying in [place].  He can stand on his own, but needs assistance with walking.  He has lost significant weight during his illnesses.  He still doesn't have much appetite.    He has cough with clear to yellow sputum.  Denies fever, hemoptysis, chest pain, abdominal pain, diarrhea.  Leg swelling improved.    Uses 3 liters oxygen.  Uses Bipap nightly.  No issues with mask fit.  Physical Exam:  General - in wheelchair, wearing oxygen Eyes - pupils reactive ENT - no sinus tenderness, no oral exudate, poor dentition Cardiac - regular, no murmur Chest - decreased BS, no wheeze Back - no tenderness Abd - soft, non tender Ext - ankle edema Neuro - CN intact Skin - venous stasis changes Psych - normal mood  Dg Chest Portable 1 View  Result Date: 10/29/2016 CLINICAL DATA:  Acute onset of shortness of breath. Altered mental status. Initial encounter. EXAM: PORTABLE CHEST 1 VIEW COMPARISON:  Chest radiograph performed 10/19/2016 FINDINGS: There is elevation of the right hemidiaphragm. There is no evidence of effusion or pneumothorax. The cardiomediastinal silhouette is borderline normal in size. No acute osseous abnormalities are seen. IMPRESSION: Elevation of the right hemidiaphragm.  Lungs remain grossly clear. Electronically Signed   By: Garald Balding M.D.   On: 10/29/2016 20:47    CMP Latest Ref Rng & Units 11/06/2016 11/05/2016 11/04/2016  Glucose 65 - 99 mg/dL 209(H) 156(H) 134(H)  BUN 6 - 20 mg/dL 43(H) 39(H) 39(H)  Creatinine 0.61 - 1.24 mg/dL 1.48(H) 1.39(H) 1.47(H)  Sodium 135 - 145 mmol/L 137 139 143  Potassium 3.5 - 5.1 mmol/L 4.4 4.1 4.6  Chloride 101 - 111 mmol/L 95(L) 93(L) 94(L)  CO2 22 - 32 mmol/L 32 33(H)  39(H)  Calcium 8.9 - 10.3 mg/dL 8.6(L) 9.1 9.1  Total Protein 6.5 - 8.1 g/dL - - -  Total Bilirubin 0.3 - 1.2 mg/dL - - -  Alkaline Phos 38 - 126 U/L - - -  AST 15 - 41 U/L - - -  ALT 17 - 63 U/L - - -    CBC Latest Ref Rng & Units 11/06/2016 11/05/2016 11/04/2016  WBC 4.0 - 10.5 K/uL 27.9(H) 25.9(H) 24.2(H)  Hemoglobin 13.0 - 17.0 g/dL 13.6 13.8 13.2  Hematocrit 39.0 - 52.0 % 41.1 42.2 40.5  Platelets 150 - 400 K/uL 135(L) 126(L) 114(L)    ABG    Component Value Date/Time   PHART 7.448 10/31/2016 0940  PCO2ART 57.5 (H) 10/31/2016 0940   PO2ART 53.0 (L) 10/31/2016 0940   HCO3 39.2 (H) 10/31/2016 0940   TCO2 42 09/14/2016 1900   ACIDBASEDEF 3.1 (H) 06/10/2010 0511   O2SAT 86.0 10/31/2016 0940    Discussion:   Dyspnea >> related to COPD, OSA/OHS, morbid obesity, chronic diastolic heart failure, Rt hemidiaphragm paralysis, and deconditioning.  Assessment/Plan:  COPD with emphysema and chronic bronchitis. - continue spiriva, symbicort, and prn albuterol - advised him to use mucinex and flutter valve - wean off prednisone from recent hospital stay  Obstructive sleep apnea. - he is compliant with Bipap and reports benefit - continue Bipap 14/10 cm H2O  Chronic respiratory failure with hypoxia/hypercapnic >> related to COPD, OSA/OHS, Rt hemidiaphragm paralysis. - continue 3 liters oxygen 24/7  Recent pneumonia with mediastinal adenopathy. - f/u CXR at next visit  Weight loss, deconditioning. - discussed importance of maintaining his muscle mass and conditioning   Patient Instructions  Follow up in 3 months    Chesley Mires, MD [facility] Pager:  [phone] 11/10/2016, 10:04 AM

## 2016-11-11 LAB — PROTIME-INR: Protime: 21.3 seconds — AB (ref 10.0–13.8)

## 2016-11-11 LAB — POCT INR: INR: 1.9 — AB (ref 0.9–1.1)

## 2016-11-14 ENCOUNTER — Encounter (HOSPITAL_COMMUNITY): Payer: Self-pay | Admitting: Emergency Medicine

## 2016-11-14 ENCOUNTER — Emergency Department (HOSPITAL_COMMUNITY)
Admission: EM | Admit: 2016-11-14 | Discharge: 2016-11-14 | Disposition: A | Discharge status: Home / Self Care | Payer: PPO | Attending: Emergency Medicine | Admitting: Emergency Medicine

## 2016-11-14 ENCOUNTER — Emergency Department (HOSPITAL_COMMUNITY): Payer: PPO

## 2016-11-14 DIAGNOSIS — Z87891 Personal history of nicotine dependence: Secondary | ICD-10-CM | POA: Diagnosis not present

## 2016-11-14 DIAGNOSIS — R9431 Abnormal electrocardiogram [ECG] [EKG]: Secondary | ICD-10-CM | POA: Diagnosis not present

## 2016-11-14 DIAGNOSIS — Z7901 Long term (current) use of anticoagulants: Secondary | ICD-10-CM | POA: Insufficient documentation

## 2016-11-14 DIAGNOSIS — R0602 Shortness of breath: Secondary | ICD-10-CM | POA: Diagnosis not present

## 2016-11-14 DIAGNOSIS — R531 Weakness: Secondary | ICD-10-CM | POA: Diagnosis not present

## 2016-11-14 DIAGNOSIS — Z794 Long term (current) use of insulin: Secondary | ICD-10-CM | POA: Diagnosis not present

## 2016-11-14 DIAGNOSIS — R0600 Dyspnea, unspecified: Secondary | ICD-10-CM | POA: Diagnosis not present

## 2016-11-14 DIAGNOSIS — R069 Unspecified abnormalities of breathing: Secondary | ICD-10-CM | POA: Diagnosis not present

## 2016-11-14 DIAGNOSIS — I110 Hypertensive heart disease with heart failure: Secondary | ICD-10-CM | POA: Diagnosis not present

## 2016-11-14 DIAGNOSIS — I5033 Acute on chronic diastolic (congestive) heart failure: Secondary | ICD-10-CM | POA: Insufficient documentation

## 2016-11-14 DIAGNOSIS — J80 Acute respiratory distress syndrome: Secondary | ICD-10-CM | POA: Diagnosis not present

## 2016-11-14 DIAGNOSIS — J449 Chronic obstructive pulmonary disease, unspecified: Secondary | ICD-10-CM | POA: Diagnosis not present

## 2016-11-14 DIAGNOSIS — E119 Type 2 diabetes mellitus without complications: Secondary | ICD-10-CM | POA: Diagnosis not present

## 2016-11-14 DIAGNOSIS — Z8673 Personal history of transient ischemic attack (TIA), and cerebral infarction without residual deficits: Secondary | ICD-10-CM | POA: Insufficient documentation

## 2016-11-14 DIAGNOSIS — Z79899 Other long term (current) drug therapy: Secondary | ICD-10-CM | POA: Insufficient documentation

## 2016-11-14 LAB — CBC WITH DIFFERENTIAL/PLATELET
BASOS PCT: 0 %
Basophils Absolute: 0 10*3/uL (ref 0.0–0.1)
EOS PCT: 1 %
Eosinophils Absolute: 0.3 10*3/uL (ref 0.0–0.7)
HCT: 43.7 % (ref 39.0–52.0)
HEMOGLOBIN: 14.7 g/dL (ref 13.0–17.0)
LYMPHS PCT: 60 %
Lymphs Abs: 15.7 10*3/uL — ABNORMAL HIGH (ref 0.7–4.0)
MCH: 30.9 pg (ref 26.0–34.0)
MCHC: 33.6 g/dL (ref 30.0–36.0)
MCV: 92 fL (ref 78.0–100.0)
MONOS PCT: 5 %
Monocytes Absolute: 1.3 10*3/uL — ABNORMAL HIGH (ref 0.1–1.0)
NEUTROS ABS: 8.9 10*3/uL — AB (ref 1.7–7.7)
NEUTROS PCT: 34 %
Platelets: 143 10*3/uL — ABNORMAL LOW (ref 150–400)
RBC: 4.75 MIL/uL (ref 4.22–5.81)
RDW: 18.4 % — ABNORMAL HIGH (ref 11.5–15.5)
WBC: 26.2 10*3/uL — ABNORMAL HIGH (ref 4.0–10.5)

## 2016-11-14 LAB — BASIC METABOLIC PANEL
Anion gap: 10 (ref 5–15)
BUN: 35 mg/dL — ABNORMAL HIGH (ref 6–20)
CALCIUM: 8.6 mg/dL — AB (ref 8.9–10.3)
CO2: 33 mmol/L — ABNORMAL HIGH (ref 22–32)
Chloride: 97 mmol/L — ABNORMAL LOW (ref 101–111)
Creatinine, Ser: 1.5 mg/dL — ABNORMAL HIGH (ref 0.61–1.24)
GFR calc Af Amer: 49 mL/min — ABNORMAL LOW (ref >60)
GFR, EST NON AFRICAN AMERICAN: 42 mL/min — AB (ref >60)
GLUCOSE: 140 mg/dL — AB (ref 65–99)
Potassium: 4.3 mmol/L (ref 3.5–5.1)
SODIUM: 140 mmol/L (ref 135–145)

## 2016-11-14 LAB — BRAIN NATRIURETIC PEPTIDE: B Natriuretic Peptide: 89 pg/mL (ref 0.0–100.0)

## 2016-11-14 MED ORDER — ALBUTEROL SULFATE (2.5 MG/3ML) 0.083% IN NEBU: 5.0000 mg | INHALATION_SOLUTION | Freq: Once | RESPIRATORY_TRACT | Status: DC

## 2016-11-14 NOTE — ED Notes (Addendum)
Camille RN, Case Management spoke with [place] and they will be going to [facility] today to look at BiPAP machine and repair/replace as needed. Dr. Laverta Baltimore aware and pt to be placed up for discharge

## 2016-11-14 NOTE — ED Notes (Signed)
PT called out stating he felt short of breath, pt breathing heavily and at 24-26RR/min and speaking in 2-3 words at a time, SpO2 remained 97% on 3L [place]. Upon returning to room with Dr. Laverta Baltimore pt is breathing normally and does not c/o shortness of breath, speaks in complete sentences, VSS.

## 2016-11-14 NOTE — ED Notes (Addendum)
Spoke with Corlis Leak [phone]) from [place] and she states pt bought his BiPap 15 years ago from [place]. Camille Case Management made aware. Breakfast tray ordered for patient.

## 2016-11-14 NOTE — ED Triage Notes (Signed)
Ems pt from [place], pt was feeling short of breath tonight and was on his cpap but not getting any better, ems arrived and said it appeared to have a leak in the tubing. Initial sat 85% changed to EMS bipap and sats up to 99% pt was given 5 of albuterol PTA

## 2016-11-14 NOTE — ED Notes (Signed)
Pt comfortable with d/c and f/u instructions. Report given to [facility].

## 2016-11-14 NOTE — ED Provider Notes (Signed)
[facility] DEPT Provider Note   CSN: [id number] Arrival date & time: 11/14/16  [id number]  By signing my name below, I, William Braun, attest that this documentation has been prepared under the direction and in the presence of William Hacker, MD. Electronically Signed: Oleh Braun, Scribe. 11/14/16. 3:54 AM.   History   Chief Complaint Chief Complaint  Patient presents with  . Shortness of Breath    HPI AIRON Braun is a 83 y.o. male with history of CHF, HTN, HLD, diabetes, A-fib on Coumadin, and obesity who presents to the ED with a social problem. This patient was discharged from this facility 1 week ago following admission for respiratory failure and acute encephalopathy. Transitioned to skilled nursing facility. He presents tonight stating "I was at the nursing facility. They couldn't hook up my CPAP properly. Finally I said just send me to the emergency department and they will get this sorted out." At interview, the patient states his dyspnea has been Worsens at night when he has not been able to get his CPAP.  Otherwise he has no acute complaints. Per EMS, he was hypoxic and his see Pap appear to have a leak. Upon their arrival and placement on their machine, he improved drastically. He is on 3L nasal cannula at home. Does report a cough; he received plain films of the chest during prior admission that demonstrated possible pneumonia. He finished a course of oral Keflex during admission. Denies any fevers or chest pain.   The history is provided by the patient. No language interpreter was used.    Past Medical History:  Diagnosis Date  . Atrial fibrillation ([place])   . Cerebrovascular disease   . CHF (congestive heart failure) ([place])   . CLL (chronic lymphocytic leukemia) ([place]) 11/17/2014  . COPD (chronic obstructive pulmonary disease) ([place])   . History of thrombocytopenia   . Obesity   . Osteoarthritis   . Other and unspecified hyperlipidemia   . Type II or unspecified  type diabetes mellitus without mention of complication, not stated as uncontrolled   . Unspecified essential hypertension     Patient Active Problem List   Diagnosis Date Noted  . Acute on chronic diastolic CHF (congestive heart failure) ([place]) 10/31/2016  . Pressure injury of skin 10/30/2016  . Complicated UTI (urinary tract infection) 10/29/2016  . Acute on chronic respiratory failure with hypoxia ([place]) 09/14/2016  . Elevated troponin 09/14/2016  . Hypokalemia 09/14/2016  . Acute renal failure superimposed on stage 3 chronic kidney disease ([place]) 09/14/2016  . Toe ulcer, right ([place]) 02/02/2016  . Type II diabetes mellitus with neurological manifestations ([place]) 02/02/2016  . CLL (chronic lymphocytic leukemia) ([place]) 11/17/2014  . Elevated WBCs 04/23/2014  . Other emphysema ([place]) 09/27/2013  . Dyspnea 08/13/2013  . 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 Chest pain, unspecified 08/03/2011  . Chronic diastolic CHF (congestive heart failure) ([place]) 05/11/2011  . Obesity hypoventilation syndrome ([place]) 03/12/2010  . CONGESTIVE HEART FAILURE, BIVENTRICULAR DYSFUNCTION 03/12/2010  . COPD exacerbation ([place]) 03/12/2010  . Chronic respiratory failure ([place]) 02/19/2010  . HLD (hyperlipidemia) 12/03/2008  . Morbid obesity ([place]) 12/03/2008  . Essential hypertension 12/03/2008  . ATRIAL FIBRILLATION 12/03/2008  . PERIPHERAL EDEMA 12/03/2008    Past Surgical History:  Procedure Laterality Date  . KNEE ARTHROSCOPY W/ ALLOGRAFT IMPANT    . VASECTOMY         Home Medications    Prior to Admission medications   Medication Sig Start Date End Date Taking? Authorizing Provider  albuterol (  VENTOLIN HFA) 108 (90 BASE) MCG/ACT inhaler Inhale 2 puffs into the lungs every 6 (six) hours as needed for wheezing or shortness of breath. 07/23/15  Yes Chesley Mires, MD  alum & mag hydroxide-simeth (MAALOX/MYLANTA) 200-200-20 MG/5ML suspension Take 30 mLs by mouth as needed for indigestion or heartburn. Patient  taking differently: Take 30 mLs by mouth every 4 (four) hours as needed for indigestion or heartburn.  11/07/16  Yes Lavina Hamman, MD  atorvastatin (LIPITOR) 10 MG tablet Take 1 tablet (10 mg total) by mouth daily at 6 PM. 11/07/16  Yes Lavina Hamman, MD  budesonide-formoterol [facility]) 160-4.5 MCG/ACT inhaler Inhale 2 puffs into the lungs 2 (two) times daily. 2 puffs twice daily Patient taking differently: Inhale 2 puffs into the lungs 2 (two) times daily. Rinse mouth after use 11/13/15  Yes Chesley Mires, MD  glimepiride (AMARYL) 4 MG tablet Take 8 mg by mouth daily with breakfast.   Yes Historical Provider, MD  Guaifenesin (MUCINEX MAXIMUM STRENGTH) 1200 MG TB12 Take 1,200 mg by mouth 2 (two) times daily as needed (cough).   Yes Historical Provider, MD  HYDROcodone-acetaminophen (NORCO/VICODIN) 5-325 MG tablet Take 1 tablet by mouth every 6 (six) hours as needed for moderate pain or severe pain. 11/07/16  Yes Lavina Hamman, MD  Insulin Glargine (LANTUS) 100 UNIT/ML Solostar Pen Inject 18 Units into the skin daily at 10 pm. 11/07/16  Yes Lavina Hamman, MD  ipratropium-albuterol (DUONEB) 0.5-2.5 (3) MG/3ML SOLN Take 3 mLs by nebulization every 6 (six) hours as needed. Patient taking differently: Take 3 mLs by nebulization every 6 (six) hours as needed (COPD & CHF).  03/31/16  Yes Magdalen Spatz, NP  lidocaine (LIDODERM) 5 % Place 1 patch onto the skin daily. Remove & Discard patch within 12 hours or as directed by MD Patient taking differently: Place 1 patch onto the skin daily. Remove & Discard patch within 12 hours or as directed by MD - apply to left knee 11/07/16  Yes Lavina Hamman, MD  metoprolol succinate (TOPROL-XL) 25 MG 24 hr tablet Take 2 tablets (50 mg total) by mouth daily. 11/07/16  Yes Lavina Hamman, MD  nitroGLYCERIN (NITROSTAT) 0.4 MG SL tablet Place 1 tablet (0.4 mg total) under the tongue every 5 (five) minutes as needed for chest pain. 08/03/11  Yes Liliane Shi, PA-C  Nutritional  Supplements (NUTRITIONAL SUPPLEMENT PO) Take 120 mLs by mouth 3 (three) times daily. Medpass   Yes Historical Provider, MD  nystatin (MYCOSTATIN) 100000 UNIT/ML suspension Take 5 mLs (500,000 Units total) by mouth 4 (four) times daily. Patient taking differently: Take 5 mLs by mouth See admin instructions. 7 day course for thrush - stop date 11/14/16 - take 5 mls 4 times daily 11/07/16 11/19/16 Yes Lavina Hamman, MD  OXYGEN Inhale 3 L into the lungs continuous.    Yes Historical Provider, MD  polyethylene glycol (MIRALAX / GLYCOLAX) packet Take 17 g by mouth daily. Patient taking differently: Take 17 g by mouth daily. Mix in 8 oz liquid and drink 11/07/16  Yes Lavina Hamman, MD  potassium chloride SA (K-DUR,KLOR-CON) 20 MEQ tablet Take 1 tablet (20 mEq total) by mouth 2 (two) times daily. 11/07/16  Yes Lavina Hamman, MD  senna-docusate (SENOKOT-S) 8.6-50 MG tablet Take 1 tablet by mouth at bedtime as needed for mild constipation. Patient taking differently: Take 1 tablet by mouth at bedtime as needed (constipation).  11/07/16  Yes Lavina Hamman, MD  tiotropium (  SPIRIVA) 18 MCG inhalation capsule Place 1 capsule (18 mcg total) into inhaler and inhale daily. 11/13/15  Yes Chesley Mires, MD  torsemide (DEMADEX) 20 MG tablet Take 2 tablets (40 mg total) by mouth 2 (two) times daily. 11/07/16  Yes Lavina Hamman, MD  warfarin (COUMADIN) 2.5 MG tablet Take 2.5 mg by mouth daily at 6 PM.   Yes Historical Provider, MD  feeding supplement, GLUCERNA SHAKE, (GLUCERNA SHAKE) LIQD Take 237 mLs by mouth 3 (three) times daily between meals. Patient not taking: Reported on 11/14/2016 11/07/16   Lavina Hamman, MD    Family History Family History  Problem Relation Age of Onset  . Heart disease Father     Social History Social History  Substance Use Topics  . Smoking status: Former Smoker    Packs/day: 2.00    Years: 34.00    Types: Cigarettes    Start date: 04/23/1946    Quit date: 09/19/1978  . Smokeless tobacco:  Never Used     Comment: quit 35 years ago  . Alcohol use No     Allergies   Quinine   Review of Systems Review of Systems  Constitutional: Negative for fever.  Respiratory: Positive for cough and shortness of breath.   Cardiovascular: Negative for chest pain and leg swelling.  Gastrointestinal: Negative for abdominal pain.  All other systems reviewed and are negative.    Physical Exam Updated Vital Signs BP 101/66   Pulse 87   Temp 98.2 F (36.8 C) (Oral)   Ht 6\' 2"  (1.88 m)   Wt 256 lb (116.1 kg)   SpO2 100%   BMI 32.87 kg/m   Physical Exam  Constitutional: He is oriented to person, place, and time.  Elderly, no acute respiratory distress  HENT:  Head: Normocephalic and atraumatic.  Cardiovascular: Normal rate and normal heart sounds.   No murmur heard. Irregular rhythm  Pulmonary/Chest: Effort normal. No respiratory distress. He has no wheezes.  Coarse breath sounds bilaterally, fair air movement, no acute respiratory distress, nasal cannula in place  Abdominal: Soft. Bowel sounds are normal. There is no tenderness. There is no rebound.  Musculoskeletal:  Trace bilateral lower extremity edema, skin darkening and venous stasis changes of the bilateral lower extremities  Neurological: He is alert and oriented to person, place, and time.  Skin: Skin is warm and dry.  Psychiatric: He has a normal mood and affect.  Nursing note and vitals reviewed.    ED Treatments / Results  Labs (all labs ordered are listed, but only abnormal results are displayed) Labs Reviewed  CBC WITH DIFFERENTIAL/PLATELET - Abnormal; Notable for the following:       Result Value   WBC 26.2 (*)    RDW 18.4 (*)    Platelets 143 (*)    Neutro Abs 8.9 (*)    Lymphs Abs 15.7 (*)    Monocytes Absolute 1.3 (*)    All other components within normal limits  BASIC METABOLIC PANEL - Abnormal; Notable for the following:    Chloride 97 (*)    CO2 33 (*)    Glucose, Bld 140 (*)    BUN 35 (*)     Creatinine, Ser 1.50 (*)    Calcium 8.6 (*)    GFR calc non Af Amer 42 (*)    GFR calc Af Amer 49 (*)    All other components within normal limits  BRAIN NATRIURETIC PEPTIDE    EKG  EKG Interpretation  Date/Time:  Monday November 14 2016 03:28:21 EST Ventricular Rate:  94 PR Interval:    QRS Duration: 147 QT Interval:  394 QTC Calculation: 493 R Axis:   -11 Text Interpretation:  Atrial fibrillation Right bundle branch block No significant change since last tracing Confirmed by HORTON  MD, COURTNEY ([id number]) on 11/14/2016 3:36:40 AM       Radiology Dg Chest 2 View  Result Date: 11/14/2016 CLINICAL DATA:  Shortness of breath today. EXAM: CHEST  2 VIEW COMPARISON:  Radiographs 11/03/2016.  Chest CT 09/19/2016 FINDINGS: Stable heart size and mediastinal contours with mild cardiomegaly. Unchanged elevation of the right hemidiaphragm. Minimal vascular congestion without pulmonary edema. Improving left lung base aeration with mild atelectasis. No new airspace opacity. No pleural fluid or pneumothorax. Unchanged osseous structures. IMPRESSION: Improving left basilar aeration with mild residual atelectasis. Stable cardiomegaly.  Mild vascular congestion without overt edema. Electronically Signed   By: Jeb Levering M.D.   On: 11/14/2016 04:04    Procedures Procedures (including critical care time)  Medications Ordered in ED Medications - No data to display   Initial Impression / Assessment and Plan / ED Course  I have reviewed the triage vital signs and the nursing notes.  Pertinent labs & imaging results that were available during my care of the patient were reviewed by me and considered in my medical decision making (see chart for details).     Patient presents with shortness of breath. This is in the setting of inability to have C Pap at night which is at his baseline. He is in rehabilitation at the moment following hospital stay. At home he is on 3 L of oxygen at baseline.  He is otherwise nontoxic-appearing. Coarse breath sounds bilaterally but fair air movement. Chest x-ray is improved from last hospitalization. Lab work is consistent with discharge lab work including a white count of 26 which is persistent but unchanged. Per EMS, they report that the facility was unsure how to work his home C Pap machine. There was also a question of whether it was working appropriately. Patient states that it has never malfunctioned at home. He baseline has difficulty operating machines independently because of some difficulties with his right hand. I will have case management consulted to ensure that patient can have appropriate C Pap machine at his facility to prevent further episodes of shortness of breath.  Final Clinical Impressions(s) / ED Diagnoses   Final diagnoses:  None    New Prescriptions New Prescriptions   No medications on file   I personally performed the services described in this documentation, which was scribed in my presence. The recorded information has been reviewed and is accurate.    William Hacker, MD 11/14/16 [phone]

## 2016-11-14 NOTE — Discharge Instructions (Signed)
You were seen in the ED today with difficulty breathing. We will have someone come out to look at your CPAP machine and repair or replace it as needed.   Return to the ED with any new or worsening difficulty breathing or new chest pain.

## 2016-11-14 NOTE — ED Provider Notes (Signed)
Blood pressure 115/95, pulse 92, temperature 98.2 F (36.8 C), temperature source Oral, resp. rate 16, height 6\' 2"  (1.88 m), weight 256 lb (116.1 kg), SpO2 99 %.  Assuming care from Dr. Dina Rich.  In short, William Braun is a 83 y.o. male with a chief complaint of Shortness of Breath .  Refer to the original H&P for additional details.  The current plan of care is to follow Case Manager evaluation for home CPAP.  09:22 AM Spoke with Case Manager. The patient will have his CPAP evaluated at [facility] today for either repair vs replacement. Will also educate staff on its use. Patient is awake and alert. No respiratory distress. Seems stable for discharge.   Nanda Quinton, MD    Margette Fast, MD 11/14/16 [phone]

## 2016-11-14 NOTE — Discharge Planning (Signed)
[facility] consulted to assist with pt home CPAP machine repairs/replacement.  [place] contacted Edwinna Areola, RN [facility] liason to report faulty equipment.  Santiago Glad will send technician to pt residence today [facility]) to access the condition of the equipment.

## 2016-11-15 ENCOUNTER — Ambulatory Visit: Payer: PPO | Admitting: Family

## 2016-11-15 ENCOUNTER — Other Ambulatory Visit: Payer: PPO

## 2016-11-15 ENCOUNTER — Other Ambulatory Visit: Payer: Self-pay | Admitting: *Deleted

## 2016-11-15 LAB — BASIC METABOLIC PANEL
BUN: 31 mg/dL — AB (ref 4–21)
Creatinine: 1.1 mg/dL (ref 0.6–1.3)
GLUCOSE: 124 mg/dL
Potassium: 3.3 mmol/L — AB (ref 3.4–5.3)
Sodium: 146 mmol/L (ref 137–147)

## 2016-11-15 MED ORDER — HYDROCODONE-ACETAMINOPHEN 5-325 MG PO TABS: 1.0000 | ORAL_TABLET | Freq: Four times a day (QID) | ORAL | 0 refills | Status: DC | PRN

## 2016-11-15 NOTE — Telephone Encounter (Signed)
[facility] [phone] Fax: [phone]  

## 2016-11-16 DIAGNOSIS — J438 Other emphysema: Secondary | ICD-10-CM | POA: Diagnosis not present

## 2016-11-16 DIAGNOSIS — E662 Morbid (severe) obesity with alveolar hypoventilation: Secondary | ICD-10-CM | POA: Diagnosis not present

## 2016-11-16 DIAGNOSIS — J449 Chronic obstructive pulmonary disease, unspecified: Secondary | ICD-10-CM | POA: Diagnosis not present

## 2016-11-16 DIAGNOSIS — R0602 Shortness of breath: Secondary | ICD-10-CM | POA: Diagnosis not present

## 2016-11-16 DIAGNOSIS — J9610 Chronic respiratory failure, unspecified whether with hypoxia or hypercapnia: Secondary | ICD-10-CM | POA: Diagnosis not present

## 2016-11-16 DIAGNOSIS — G4733 Obstructive sleep apnea (adult) (pediatric): Secondary | ICD-10-CM | POA: Diagnosis not present

## 2016-11-17 ENCOUNTER — Other Ambulatory Visit (HOSPITAL_BASED_OUTPATIENT_CLINIC_OR_DEPARTMENT_OTHER): Payer: PPO

## 2016-11-17 ENCOUNTER — Ambulatory Visit (HOSPITAL_BASED_OUTPATIENT_CLINIC_OR_DEPARTMENT_OTHER): Payer: PPO | Admitting: Hematology & Oncology

## 2016-11-17 VITALS — BP 106/68 | HR 93 | Temp 98.2°F | Resp 20

## 2016-11-17 DIAGNOSIS — Z96651 Presence of right artificial knee joint: Secondary | ICD-10-CM | POA: Diagnosis not present

## 2016-11-17 DIAGNOSIS — R488 Other symbolic dysfunctions: Secondary | ICD-10-CM | POA: Diagnosis not present

## 2016-11-17 DIAGNOSIS — E785 Hyperlipidemia, unspecified: Secondary | ICD-10-CM | POA: Diagnosis not present

## 2016-11-17 DIAGNOSIS — E876 Hypokalemia: Secondary | ICD-10-CM | POA: Diagnosis not present

## 2016-11-17 DIAGNOSIS — G4733 Obstructive sleep apnea (adult) (pediatric): Secondary | ICD-10-CM | POA: Diagnosis not present

## 2016-11-17 DIAGNOSIS — C9110 Chronic lymphocytic leukemia of B-cell type not having achieved remission: Secondary | ICD-10-CM | POA: Diagnosis not present

## 2016-11-17 DIAGNOSIS — E1142 Type 2 diabetes mellitus with diabetic polyneuropathy: Secondary | ICD-10-CM | POA: Insufficient documentation

## 2016-11-17 DIAGNOSIS — Z9981 Dependence on supplemental oxygen: Secondary | ICD-10-CM | POA: Diagnosis not present

## 2016-11-17 DIAGNOSIS — E1122 Type 2 diabetes mellitus with diabetic chronic kidney disease: Secondary | ICD-10-CM | POA: Diagnosis not present

## 2016-11-17 DIAGNOSIS — C9510 Chronic leukemia of unspecified cell type not having achieved remission: Secondary | ICD-10-CM | POA: Diagnosis not present

## 2016-11-17 DIAGNOSIS — E1121 Type 2 diabetes mellitus with diabetic nephropathy: Secondary | ICD-10-CM | POA: Insufficient documentation

## 2016-11-17 DIAGNOSIS — J209 Acute bronchitis, unspecified: Secondary | ICD-10-CM | POA: Diagnosis not present

## 2016-11-17 DIAGNOSIS — Z6832 Body mass index (BMI) 32.0-32.9, adult: Secondary | ICD-10-CM | POA: Diagnosis not present

## 2016-11-17 DIAGNOSIS — I5032 Chronic diastolic (congestive) heart failure: Secondary | ICD-10-CM | POA: Diagnosis not present

## 2016-11-17 DIAGNOSIS — I482 Chronic atrial fibrillation: Secondary | ICD-10-CM | POA: Diagnosis not present

## 2016-11-17 DIAGNOSIS — S61201D Unspecified open wound of left index finger without damage to nail, subsequent encounter: Secondary | ICD-10-CM | POA: Diagnosis not present

## 2016-11-17 DIAGNOSIS — N183 Chronic kidney disease, stage 3 unspecified: Secondary | ICD-10-CM | POA: Insufficient documentation

## 2016-11-17 DIAGNOSIS — G4700 Insomnia, unspecified: Secondary | ICD-10-CM | POA: Diagnosis not present

## 2016-11-17 DIAGNOSIS — Z79899 Other long term (current) drug therapy: Secondary | ICD-10-CM | POA: Diagnosis not present

## 2016-11-17 DIAGNOSIS — S61201A Unspecified open wound of left index finger without damage to nail, initial encounter: Secondary | ICD-10-CM | POA: Diagnosis not present

## 2016-11-17 DIAGNOSIS — Z87891 Personal history of nicotine dependence: Secondary | ICD-10-CM | POA: Diagnosis not present

## 2016-11-17 DIAGNOSIS — Z8673 Personal history of transient ischemic attack (TIA), and cerebral infarction without residual deficits: Secondary | ICD-10-CM | POA: Diagnosis not present

## 2016-11-17 DIAGNOSIS — Z7901 Long term (current) use of anticoagulants: Secondary | ICD-10-CM | POA: Diagnosis not present

## 2016-11-17 DIAGNOSIS — Z8614 Personal history of Methicillin resistant Staphylococcus aureus infection: Secondary | ICD-10-CM | POA: Diagnosis not present

## 2016-11-17 DIAGNOSIS — I5082 Biventricular heart failure: Secondary | ICD-10-CM | POA: Diagnosis not present

## 2016-11-17 DIAGNOSIS — Z7951 Long term (current) use of inhaled steroids: Secondary | ICD-10-CM | POA: Diagnosis not present

## 2016-11-17 DIAGNOSIS — S70212D Abrasion, left hip, subsequent encounter: Secondary | ICD-10-CM | POA: Diagnosis not present

## 2016-11-17 DIAGNOSIS — J42 Unspecified chronic bronchitis: Secondary | ICD-10-CM | POA: Diagnosis not present

## 2016-11-17 DIAGNOSIS — N184 Chronic kidney disease, stage 4 (severe): Secondary | ICD-10-CM | POA: Diagnosis not present

## 2016-11-17 DIAGNOSIS — I4891 Unspecified atrial fibrillation: Secondary | ICD-10-CM | POA: Diagnosis not present

## 2016-11-17 DIAGNOSIS — G3184 Mild cognitive impairment, so stated: Secondary | ICD-10-CM | POA: Diagnosis not present

## 2016-11-17 DIAGNOSIS — E1140 Type 2 diabetes mellitus with diabetic neuropathy, unspecified: Secondary | ICD-10-CM | POA: Insufficient documentation

## 2016-11-17 DIAGNOSIS — B370 Candidal stomatitis: Secondary | ICD-10-CM | POA: Diagnosis not present

## 2016-11-17 DIAGNOSIS — I130 Hypertensive heart and chronic kidney disease with heart failure and stage 1 through stage 4 chronic kidney disease, or unspecified chronic kidney disease: Secondary | ICD-10-CM | POA: Diagnosis not present

## 2016-11-17 DIAGNOSIS — I5033 Acute on chronic diastolic (congestive) heart failure: Secondary | ICD-10-CM | POA: Diagnosis not present

## 2016-11-17 DIAGNOSIS — E119 Type 2 diabetes mellitus without complications: Secondary | ICD-10-CM

## 2016-11-17 DIAGNOSIS — J449 Chronic obstructive pulmonary disease, unspecified: Secondary | ICD-10-CM | POA: Diagnosis not present

## 2016-11-17 DIAGNOSIS — Z794 Long term (current) use of insulin: Secondary | ICD-10-CM | POA: Diagnosis not present

## 2016-11-17 DIAGNOSIS — M6281 Muscle weakness (generalized): Secondary | ICD-10-CM | POA: Diagnosis not present

## 2016-11-17 DIAGNOSIS — J9610 Chronic respiratory failure, unspecified whether with hypoxia or hypercapnia: Secondary | ICD-10-CM | POA: Diagnosis not present

## 2016-11-17 DIAGNOSIS — F39 Unspecified mood [affective] disorder: Secondary | ICD-10-CM | POA: Diagnosis not present

## 2016-11-17 DIAGNOSIS — E662 Morbid (severe) obesity with alveolar hypoventilation: Secondary | ICD-10-CM | POA: Diagnosis not present

## 2016-11-17 LAB — CBC WITH DIFFERENTIAL (CANCER CENTER ONLY)
BASO#: 0.1 10*3/uL (ref 0.0–0.2)
BASO%: 0.2 % (ref 0.0–2.0)
EOS%: 1.3 % (ref 0.0–7.0)
Eosinophils Absolute: 0.4 10*3/uL (ref 0.0–0.5)
HCT: 43.4 % (ref 38.7–49.9)
HGB: 14.5 g/dL (ref 13.0–17.1)
LYMPH#: 15.1 10*3/uL — ABNORMAL HIGH (ref 0.9–3.3)
LYMPH%: 55.9 % — AB (ref 14.0–48.0)
MCH: 31.1 pg (ref 28.0–33.4)
MCHC: 33.4 g/dL (ref 32.0–35.9)
MCV: 93 fL (ref 82–98)
MONO#: 1.5 10*3/uL — AB (ref 0.1–0.9)
MONO%: 5.5 % (ref 0.0–13.0)
NEUT#: 10.1 10*3/uL — ABNORMAL HIGH (ref 1.5–6.5)
NEUT%: 37.1 % — AB (ref 40.0–80.0)
PLATELETS: 150 10*3/uL (ref 145–400)
RBC: 4.66 10*6/uL (ref 4.20–5.70)
RDW: 18.9 % — AB (ref 11.1–15.7)
WBC: 27.1 10*3/uL — ABNORMAL HIGH (ref 4.0–10.0)

## 2016-11-17 LAB — CMP (CANCER CENTER ONLY)
ALK PHOS: 98 U/L — AB (ref 26–84)
ALT: 50 U/L — AB (ref 10–47)
AST: 34 U/L (ref 11–38)
Albumin: 3.1 g/dL — ABNORMAL LOW (ref 3.3–5.5)
BUN: 33 mg/dL — AB (ref 7–22)
CO2: 33 meq/L (ref 18–33)
Calcium: 9.3 mg/dL (ref 8.0–10.3)
Chloride: 99 mEq/L (ref 98–108)
Creat: 1.5 mg/dl — ABNORMAL HIGH (ref 0.6–1.2)
GLUCOSE: 135 mg/dL — AB (ref 73–118)
POTASSIUM: 4 meq/L (ref 3.3–4.7)
Sodium: 145 mEq/L (ref 128–145)
Total Bilirubin: 0.9 mg/dl (ref 0.20–1.60)
Total Protein: 6.7 g/dL (ref 6.4–8.1)

## 2016-11-17 LAB — TECHNOLOGIST REVIEW CHCC SATELLITE

## 2016-11-17 NOTE — Progress Notes (Signed)
Hematology and Oncology Follow Up Visit  William Braun [id number] 1933-04-21 83 y.o. 11/17/2016   Principle Diagnosis:  CLL-stage A  Current Therapy:   Observation    Interim History:  William Braun is here today with his wife for a follow-up. He was recently hospitalized. He had a bad urinary tract infection.Marland Kitchen He was on antibiotics. Thankfully he got through this. He did not have any issues with congestive heart failure. His white cell count was elevated but seemed to come down a little bit.  Currently, he seems be doing okay. He comes in a wheelchair. He is in a rehabilitation facility/nursing home.  He's had no problems with congestive heart failure. He had an echocardiogram done on January 1. He had a good LVEF of 55-60%.    He is on supplemental oxygen.  He is on Coumadin. He has atrial fibrillation.  He is on  insulin for diabetes.   Currently, his performance status is ECOG 3.  Medications:  Allergies as of 11/17/2016      Reactions   Quinine Palpitations, Other (See Comments)   Seizure       Medication List       Accurate as of 11/17/16  1:47 PM. Always use your most recent med list.          albuterol 108 (90 Base) MCG/ACT inhaler Commonly known as:  VENTOLIN HFA Inhale 2 puffs into the lungs every 6 (six) hours as needed for wheezing or shortness of breath.   alum & mag hydroxide-simeth 200-200-20 MG/5ML suspension Commonly known as:  MAALOX/MYLANTA Take 30 mLs by mouth as needed for indigestion or heartburn.   atorvastatin 10 MG tablet Commonly known as:  LIPITOR Take 1 tablet (10 mg total) by mouth daily at 6 PM.   budesonide-formoterol 160-4.5 MCG/ACT inhaler Commonly known as:  SYMBICORT Inhale 2 puffs into the lungs 2 (two) times daily. 2 puffs twice daily   NUTRITIONAL SUPPLEMENT PO Take 120 mLs by mouth 3 (three) times daily. Medpass   feeding supplement (GLUCERNA SHAKE) Liqd Take 237 mLs by mouth 3 (three) times daily between meals.   glimepiride  4 MG tablet Commonly known as:  AMARYL Take 8 mg by mouth daily with breakfast.   HYDROcodone-acetaminophen 5-325 MG tablet Commonly known as:  NORCO/VICODIN Take 1 tablet by mouth every 6 (six) hours as needed for moderate pain or severe pain.   Insulin Glargine 100 UNIT/ML Solostar Pen Commonly known as:  LANTUS Inject 18 Units into the skin daily at 10 pm.   ipratropium-albuterol 0.5-2.5 (3) MG/3ML Soln Commonly known as:  DUONEB Take 3 mLs by nebulization every 6 (six) hours as needed.   lidocaine 5 % Commonly known as:  LIDODERM Place 1 patch onto the skin daily. Remove & Discard patch within 12 hours or as directed by MD   metoprolol succinate 25 MG 24 hr tablet Commonly known as:  TOPROL-XL Take 2 tablets (50 mg total) by mouth daily.   MUCINEX MAXIMUM STRENGTH 1200 MG Tb12 Generic drug:  Guaifenesin Take 1,200 mg by mouth 2 (two) times daily as needed (cough).   nitroGLYCERIN 0.4 MG SL tablet Commonly known as:  NITROSTAT Place 1 tablet (0.4 mg total) under the tongue every 5 (five) minutes as needed for chest pain.   nystatin 100000 UNIT/ML suspension Commonly known as:  MYCOSTATIN Take 5 mLs (500,000 Units total) by mouth 4 (four) times daily.   OXYGEN Inhale 3 L into the lungs continuous.   polyethylene glycol packet  Commonly known as:  MIRALAX / GLYCOLAX Take 17 g by mouth daily.   potassium chloride SA 20 MEQ tablet Commonly known as:  K-DUR,KLOR-CON Take 1 tablet (20 mEq total) by mouth 2 (two) times daily.   senna-docusate 8.6-50 MG tablet Commonly known as:  Senokot-S Take 1 tablet by mouth at bedtime as needed for mild constipation.   tiotropium 18 MCG inhalation capsule Commonly known as:  SPIRIVA Place 1 capsule (18 mcg total) into inhaler and inhale daily.   torsemide 20 MG tablet Commonly known as:  DEMADEX Take 2 tablets (40 mg total) by mouth 2 (two) times daily.   warfarin 2.5 MG tablet Commonly known as:  COUMADIN Take 2.5 mg by  mouth daily at 6 PM.       Allergies:  Allergies  Allergen Reactions  . Quinine Palpitations and Other (See Comments)    Seizure     Past Medical History, Surgical history, Social history, and Family History were reviewed and updated.  Review of Systems: All other 10 point review of systems is negative.   Physical Exam:  oral temperature is 98.2 F (36.8 C). His blood pressure is 106/68 and his pulse is 93. His respiration is 20 and oxygen saturation is 100%.   Wt Readings from Last 3 Encounters:  11/14/16 256 lb (116.1 kg)  11/10/16 256 lb (116.1 kg)  11/07/16 257 lb 9.6 oz (116.8 kg)    Ocular: Sclerae unicteric, pupils equal, round and reactive to light Ear-nose-throat: Oropharynx clear, dentition fair Lymphatic: No cervical supraclavicular or axillary adenopathy Lungs no rales or rhonchi, good excursion bilaterally Heart regular rate and rhythm, no murmur appreciated Abd soft, nontender, positive bowel sounds, no liver or spleen tip palpated on exam MSK no focal spinal tenderness, no joint edema Neuro: non-focal, well-oriented, appropriate affect Breasts: Deferred  Lab Results  Component Value Date   WBC 27.1 (H) 11/17/2016   HGB 14.5 11/17/2016   HCT 43.4 11/17/2016   MCV 93 11/17/2016   PLT 150 11/17/2016   Lab Results  Component Value Date   FERRITIN 157 06/22/2010   IRON 15 (L) 06/22/2010   TIBC 182 (L) 06/22/2010   UIBC 167 06/22/2010   IRONPCTSAT 8 (L) 06/22/2010   Lab Results  Component Value Date   RETICCTPCT 2.8 10/30/2016   RBC 4.66 11/17/2016   Lab Results  Component Value Date   KPAFRELGTCHN 10.70 (H) 11/17/2014   LAMBDASER 7.06 (H) 11/17/2014   KAPLAMBRATIO 0.97 09/29/2016   Lab Results  Component Value Date   IGGSERUM 1,079 11/01/2016   IGA 271 11/01/2016   IGMSERUM 53 11/01/2016   Lab Results  Component Value Date   TOTALPROTELP 7.8 04/23/2014   ALBUMINELP 45.6 (L) 04/23/2014   A1GS 5.7 (H) 04/23/2014   A2GS 10.1 04/23/2014    BETS 7.0 04/23/2014   BETA2SER 6.0 04/23/2014   GAMS 25.6 (H) 04/23/2014   MSPIKE Not Observed 10/21/2015   SPEI * 04/23/2014     Chemistry      Component Value Date/Time   NA 145 11/17/2016 1251   NA 142 04/20/2016 1143   K 4.0 11/17/2016 1251   K 4.4 04/20/2016 1143   CL 99 11/17/2016 1251   CO2 33 11/17/2016 1251   CO2 31 (H) 04/20/2016 1143   BUN 33 (H) 11/17/2016 1251   BUN 30.0 (H) 04/20/2016 1143   CREATININE 1.5 (H) 11/17/2016 1251   CREATININE 1.8 (H) 04/20/2016 1143   GLU 124 11/15/2016      Component  Value Date/Time   CALCIUM 9.3 11/17/2016 1251   CALCIUM 10.0 04/20/2016 1143   ALKPHOS 98 (H) 11/17/2016 1251   ALKPHOS 69 04/20/2016 1143   AST 34 11/17/2016 1251   AST 16 04/20/2016 1143   ALT 50 (H) 11/17/2016 1251   ALT 11 04/20/2016 1143   BILITOT 0.90 11/17/2016 1251   BILITOT 0.83 04/20/2016 1143     Impression and Plan: Mr. Ishmael is 83 yo male with stage A CLL documented by flow cytometry. He has multiple health issues including CHF.   I'm glad that he is doing okay. His CLL really is not an issue. I will think this will cause him to have a higher risk of infection. His immunoglobulin status is adequate.  I just don't feel that his CLL will ever be treated. I don't think it will need to be treated. I discussed don't see anything that looks suspicious from my point of view.   His white cell count is actually stable. He is not anemic or thrombocytopenic. He does not have a large predominance of lymphocytes.  Given that his performance status is just so poor (ECOG 3) I just am not sure we are really going to be adding to his quality of life by having to treat his CLL was there was some drastic change in the status of his CLL.  For now, I think we can probably get him back in 3 months. I think this would be very reasonable.    Volanda Napoleon, MD 3/1/20181:47 PM

## 2016-11-18 LAB — IGG, IGA, IGM
IGA/IMMUNOGLOBULIN A, SERUM: 294 mg/dL (ref 61–437)
IgM, Qn, Serum: 54 mg/dL (ref 15–143)

## 2016-11-21 ENCOUNTER — Ambulatory Visit (HOSPITAL_COMMUNITY)
Admission: RE | Admit: 2016-11-21 | Discharge: 2016-11-21 | Disposition: A | Discharge status: Home / Self Care | Payer: PPO | Source: Ambulatory Visit | Attending: Cardiology | Admitting: Cardiology

## 2016-11-21 VITALS — BP 104/70 | HR 84 | Wt 250.8 lb

## 2016-11-21 DIAGNOSIS — Z96651 Presence of right artificial knee joint: Secondary | ICD-10-CM | POA: Insufficient documentation

## 2016-11-21 DIAGNOSIS — Z8673 Personal history of transient ischemic attack (TIA), and cerebral infarction without residual deficits: Secondary | ICD-10-CM | POA: Diagnosis not present

## 2016-11-21 DIAGNOSIS — I4891 Unspecified atrial fibrillation: Secondary | ICD-10-CM | POA: Diagnosis not present

## 2016-11-21 DIAGNOSIS — E785 Hyperlipidemia, unspecified: Secondary | ICD-10-CM | POA: Insufficient documentation

## 2016-11-21 DIAGNOSIS — Z7951 Long term (current) use of inhaled steroids: Secondary | ICD-10-CM | POA: Diagnosis not present

## 2016-11-21 DIAGNOSIS — E662 Morbid (severe) obesity with alveolar hypoventilation: Secondary | ICD-10-CM | POA: Insufficient documentation

## 2016-11-21 DIAGNOSIS — Z87891 Personal history of nicotine dependence: Secondary | ICD-10-CM | POA: Insufficient documentation

## 2016-11-21 DIAGNOSIS — I5032 Chronic diastolic (congestive) heart failure: Secondary | ICD-10-CM | POA: Insufficient documentation

## 2016-11-21 DIAGNOSIS — I482 Chronic atrial fibrillation, unspecified: Secondary | ICD-10-CM

## 2016-11-21 DIAGNOSIS — Z8614 Personal history of Methicillin resistant Staphylococcus aureus infection: Secondary | ICD-10-CM | POA: Insufficient documentation

## 2016-11-21 DIAGNOSIS — Z794 Long term (current) use of insulin: Secondary | ICD-10-CM | POA: Diagnosis not present

## 2016-11-21 DIAGNOSIS — N183 Chronic kidney disease, stage 3 (moderate): Secondary | ICD-10-CM | POA: Diagnosis not present

## 2016-11-21 DIAGNOSIS — Z6832 Body mass index (BMI) 32.0-32.9, adult: Secondary | ICD-10-CM | POA: Insufficient documentation

## 2016-11-21 DIAGNOSIS — I130 Hypertensive heart and chronic kidney disease with heart failure and stage 1 through stage 4 chronic kidney disease, or unspecified chronic kidney disease: Secondary | ICD-10-CM | POA: Insufficient documentation

## 2016-11-21 DIAGNOSIS — Z79899 Other long term (current) drug therapy: Secondary | ICD-10-CM | POA: Insufficient documentation

## 2016-11-21 DIAGNOSIS — Z9981 Dependence on supplemental oxygen: Secondary | ICD-10-CM | POA: Insufficient documentation

## 2016-11-21 DIAGNOSIS — E1122 Type 2 diabetes mellitus with diabetic chronic kidney disease: Secondary | ICD-10-CM | POA: Insufficient documentation

## 2016-11-21 DIAGNOSIS — C9110 Chronic lymphocytic leukemia of B-cell type not having achieved remission: Secondary | ICD-10-CM | POA: Insufficient documentation

## 2016-11-21 DIAGNOSIS — Z7901 Long term (current) use of anticoagulants: Secondary | ICD-10-CM | POA: Insufficient documentation

## 2016-11-21 NOTE — Patient Instructions (Signed)
Following orders sent to [facility]:   Needs to wear CPAP nightly at SNF  Follow up in 3 months

## 2016-11-22 NOTE — Progress Notes (Signed)
Patient ID: William Braun, male   DOB: 1932/12/01, 84 y.o.   MRN: [id number] PCP: Dr. Michail Sermon Pulmonary: Dr Halford Chessman Cardiology: Dr. Aundra Dubin  83 yo with h/o HTN, CVA, diabetes, obesity-hypoventilation syndrome, diastolic CHF, and chronic atrial fibrillation presents for cardiology followup.  He has OHS and uses BIPAP at night and oxygen during the day.  He has atrial fibrillation with reasonable rate control and is on coumadin.   His legs continue to feel weak (normal ABIs in 10/16).    He was admitted in 12/17 with COPD exacerbation/LLL PNA. He was admitted again in 2/18 with UTI/encephalopathy and AKI.  Torsemide was decreased to 40 mg bid.   He returns for follow up. He is now living in a SNF.  Weight is down 31 lbs.  Wears 3 liters oxygen daily, wife is not sure that he is keeping CPAP on at night.  He is wheelchair-bound generally but is able to use his walker some around his room.  He is short of breath walking more than 10-15 feet. Limited considerably by knee pain. No orthopnea/PND. No chest pain.  No falls, no lightheadedness. No BRBPR/melena.    Labs (2/10): creatinine 1.15, BNP 73, TSH normal  Labs (9/12): K 4.1, creatinine 1.4, BNP 90 Labs (11/12): K 4.2, creatinine 1.9 Labs (2/13): K 4.2, creatinine 1.3, proBNP 49, LDL 39, HDL 35 Labs (4/13): K 4, creatinine 1.5, BNP 48 Labs (3/14): K 4 => 3.9, creatinine 1.4 => 1.6 Labs (4/14): K 4, creatinine 1.5 Labs (5/15): K 4.2, creatinine 1.4, BNP 49 Labs (8/15): K 3.7, creatinine 1.4 Labs (10/15): K 3.6, creatinine 1.29 Labs (8/16): K 4.7, creatinine 1.34, HCT 47.9 Labs (11/16): K 4, creatinine 1.23 => 1.39, BNP 78 Labs (2/17): K 3.5, creatinine 1.85 Labs (3/17): K 4.8, creatinine 1.69, BNP 56.5 Labs (8/17): K 4.5, creatinine 1.73 Labs (3/18): K 4, creatinine 1.5, hgb 14.5  Allergies (verified):  1) ! Quinine   Past Medical History:  1. Atrial Fibrillation: apparently developed post-op right TKR in 2/10. Pt was started on coumadin. Now  chronic.  2. Diabetes Type 2  3. Hyperlipidemia  4. Hypertension  5. Cerebrovascular Disease-CVA-2008  6. Obesity  7. Osteoarthritis left knee, s/p R TKR  8. History of thrombocytopenia  9. Diastolic CHF: Echo ([id number]) with EF 55%, moderately dilated RV with moderate RV systolic dysfunction, PA systolic pressure 36 mmHg.  Echo (3/14) with EF 60-65%, moderate LVH, moderate RV dilation with normal systolic function, PA systolic pressure [id number] mmHg.  Echo (11/16) with EF 60-65%, mild LVH, mild aortic stenosis, mildly dilated aortic root 4.3 cm, PASP 71 mmHg.  10. Obesity hypoventilation syndrome/OSA: BIPAP at night, O2 during the day with exertion. PFTs (1/15) with FEV1 49%, ratio 97%, TLC 59%, DLCO 49% => mixed obstructive/restrictive.  11. Prolonged hospitalization in fall 2011 with Strep agalactiae bacteremia, MRSA PNA, and septic shock.  12. Lexiscan myoview (11/12) with EF 65%, no ischemia or infarction.  13. 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  14. CKD 15. CLL: Dr. Marin Olp 16. ABIs (7/15) normal.  ABIs (10/16) normal.   Family History:  Father: deceased MVA  Mother: deceased MVA   Social History:  Married  Tobacco Use - Former. 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   Current Outpatient Prescriptions  Medication Sig Dispense Refill  . albuterol (VENTOLIN HFA) 108 (90 BASE) MCG/ACT inhaler Inhale 2 puffs into the lungs every  6 (six) hours as needed for wheezing or shortness of breath. 3 Inhaler 1  . alum & mag hydroxide-simeth (MAALOX/MYLANTA) 200-200-20 MG/5ML suspension Take 30 mLs by mouth as needed for indigestion or heartburn. (Patient taking differently: Take 30 mLs by mouth every 4 (four) hours as needed for indigestion or heartburn. ) 355 mL 0  . atorvastatin (LIPITOR) 10 MG tablet Take 1 tablet (10 mg total) by mouth daily at 6 PM. 30 tablet 0  . budesonide-formoterol (SYMBICORT) 160-4.5  MCG/ACT inhaler Inhale 2 puffs into the lungs 2 (two) times daily. 2 puffs twice daily (Patient taking differently: Inhale 2 puffs into the lungs 2 (two) times daily. Rinse mouth after use) 3 Inhaler 3  . feeding supplement, GLUCERNA SHAKE, (GLUCERNA SHAKE) LIQD Take 237 mLs by mouth 3 (three) times daily between meals. 21 Can 0  . glimepiride (AMARYL) 4 MG tablet Take 8 mg by mouth daily with breakfast.    . Guaifenesin (MUCINEX MAXIMUM STRENGTH) 1200 MG TB12 Take 1,200 mg by mouth 2 (two) times daily as needed (cough).    Marland Kitchen HYDROcodone-acetaminophen (NORCO/VICODIN) 5-325 MG tablet Take 1 tablet by mouth every 6 (six) hours as needed for moderate pain or severe pain. 120 tablet 0  . Insulin Glargine (LANTUS) 100 UNIT/ML Solostar Pen Inject 18 Units into the skin daily at 10 pm. 15 mL 0  . ipratropium-albuterol (DUONEB) 0.5-2.5 (3) MG/3ML SOLN Take 3 mLs by nebulization every 6 (six) hours as needed. 360 mL 6  . lidocaine (LIDODERM) 5 % Place 1 patch onto the skin daily. Remove & Discard patch within 12 hours or as directed by MD 30 patch 0  . metoprolol succinate (TOPROL-XL) 25 MG 24 hr tablet Take 2 tablets (50 mg total) by mouth daily. 60 tablet 0  . nitroGLYCERIN (NITROSTAT) 0.4 MG SL tablet Place 1 tablet (0.4 mg total) under the tongue every 5 (five) minutes as needed for chest pain. 25 tablet 3  . Nutritional Supplements (NUTRITIONAL SUPPLEMENT PO) Take 120 mLs by mouth 3 (three) times daily. Medpass    . OXYGEN Inhale 3 L into the lungs continuous.     . polyethylene glycol (MIRALAX / GLYCOLAX) packet Take 17 g by mouth daily. 14 each 0  . potassium chloride SA (K-DUR,KLOR-CON) 20 MEQ tablet Take 1 tablet (20 mEq total) by mouth 2 (two) times daily. 60 tablet 0  . senna-docusate (SENOKOT-S) 8.6-50 MG tablet Take 1 tablet by mouth at bedtime as needed for mild constipation. 30 tablet 0  . tiotropium (SPIRIVA) 18 MCG inhalation capsule Place 1 capsule (18 mcg total) into inhaler and inhale  daily. 90 capsule 3  . torsemide (DEMADEX) 20 MG tablet Take 2 tablets (40 mg total) by mouth 2 (two) times daily. 60 tablet 0  . warfarin (COUMADIN) 2.5 MG tablet Take 2.5 mg by mouth daily at 6 PM.     No current facility-administered medications for this encounter.     BP 104/70   Pulse 84   Wt 250 lb 12 oz (113.7 kg)   SpO2 95%   BMI 32.19 kg/m  General: NAD, obese. Arrived in a scooter.  Neck: Thick, JVP 7 cm (difficult), no thyromegaly or thyroid nodule.  Lungs: Mild crackles at bases bilaterally.  Distant breath sounds.  CV: Nondisplaced PMI.  Heart irregular S1/S2, no S3/S4, 1/6 SEM RUSB. No edema.  No carotid bruit.  Unable to feel pedal pulses. Abdomen: Soft, nontender, no hepatosplenomegaly, no distention.  Neurologic: Alert and oriented x 3.  Psych: Normal affect. Extremities: No clubbing or cyanosis.   Assessment/Plan: 1. Chronic diastolic CHF: Probably with a component of pulmonary arterial HTN from OHS/OSA with right heart failure.  He has NYHA class IIIb symptoms, stable. Suspect OSA/OHS plays a significant role in his dyspnea. Weight is down, volume status looks ok on current torsemide.  - Continue torsemide 40 mg bid.  Recent BMET stable.  - He has not taken metolazone recently, I think he can stay off it for now.      2. OHS/OSA: Continue nocturnal Bipap and oxygen during the day.   3. Atrial fibrillation: Reasonable rate control on Toprol XL.  Continue coumadin.  4. HTN: BP stable. 5. CKD: Stage III.  Recent BMET stable.   Follow up in 3 months.    [name] 11/22/2016

## 2016-11-24 DIAGNOSIS — S61201A Unspecified open wound of left index finger without damage to nail, initial encounter: Secondary | ICD-10-CM | POA: Diagnosis not present

## 2016-11-24 DIAGNOSIS — S61201D Unspecified open wound of left index finger without damage to nail, subsequent encounter: Secondary | ICD-10-CM | POA: Diagnosis not present

## 2016-11-25 LAB — POCT INR: INR: 3.7 — AB (ref 0.9–1.1)

## 2016-11-25 LAB — PROTIME-INR: PROTIME: 36.9 s — AB (ref 10.0–13.8)

## 2016-11-28 LAB — PROTIME-INR: Protime: 29.5 seconds — AB (ref 10.0–13.8)

## 2016-11-28 LAB — POCT INR: INR: 2.8 — AB (ref 0.9–1.1)

## 2016-11-30 DIAGNOSIS — G3184 Mild cognitive impairment, so stated: Secondary | ICD-10-CM | POA: Diagnosis not present

## 2016-11-30 DIAGNOSIS — G4700 Insomnia, unspecified: Secondary | ICD-10-CM | POA: Diagnosis not present

## 2016-11-30 DIAGNOSIS — F39 Unspecified mood [affective] disorder: Secondary | ICD-10-CM | POA: Diagnosis not present

## 2016-12-01 ENCOUNTER — Non-Acute Institutional Stay (SKILLED_NURSING_FACILITY): Payer: PPO | Admitting: Internal Medicine

## 2016-12-01 ENCOUNTER — Encounter: Payer: Self-pay | Admitting: Internal Medicine

## 2016-12-01 DIAGNOSIS — J42 Unspecified chronic bronchitis: Secondary | ICD-10-CM | POA: Diagnosis not present

## 2016-12-01 DIAGNOSIS — S61201A Unspecified open wound of left index finger without damage to nail, initial encounter: Secondary | ICD-10-CM | POA: Diagnosis not present

## 2016-12-01 DIAGNOSIS — J209 Acute bronchitis, unspecified: Secondary | ICD-10-CM | POA: Diagnosis not present

## 2016-12-01 DIAGNOSIS — I5082 Biventricular heart failure: Secondary | ICD-10-CM | POA: Diagnosis not present

## 2016-12-01 DIAGNOSIS — S61201D Unspecified open wound of left index finger without damage to nail, subsequent encounter: Secondary | ICD-10-CM | POA: Diagnosis not present

## 2016-12-01 NOTE — Progress Notes (Signed)
[facility] Room: 107  PCP Leeroy Cha, MD [address] [place] [address]  This is a nursing facility follow up for specific acute issue of cough & congestion.  Interim medical record and care since last [place] visit was updated with review of diagnostic studies and change in clinical status since last visit were documented.  HPI: Evaluation of chest congestion was requested by nursing. He has both advanced cardiac & pulmonary disease. Dr Halford Chessman saw him 11/10/16 ; his chronic respiratory failure in context of COPD, OSA/OHS & R hemidiaphragm paralysis was clinically stable. Cardiology saw the patient 11/22/16 for chronic diastolic congestive heart failure. Weight at that time was 250 lbs. 12 oz.( 113.7 kg). Dr Aundra Dubin also questioned a component of pulmonary arterial hypertension from his obstructive sleep apnea and possible right heart failure. He was to continue torsemide 40 mg twice a day. Metolazone had been administered on an as-needed basis felt to be necessary. His rate control was felt to be adequate on the extended release beta blocker.  Review of systems:  Answers provided by patient and wife.  Reports dyspnea on exertion while transferring to wheelchair.  Reports occasional lightheadedness upon standing.  Chronic cough with occasional light green sputum production.  Pleuritic pain during coughing.  He validates that these pulmonary symptoms are basically chronic and stable.  Constitutional: No fever,significant weight change, fatigue  Eyes: No redness, discharge, pain, vision change ENT/mouth: No nasal congestion,  purulent discharge, earache,change in hearing ,sore throat  Cardiovascular: No chest pain, palpitations,paroxysmal nocturnal dyspnea, claudication, edema  Respiratory: No hemoptysis , significant snoring,apnea (CPAP compliant)   Gastrointestinal: No heartburn,dysphagia,abdominal pain, nausea /  vomiting,rectal bleeding, melena,change in bowels Genitourinary: No dysuria,hematuria, pyuria,  incontinence, nocturia Musculoskeletal: No joint stiffness, joint swelling, weakness Dermatologic: No rash, pruritus, change in appearance of skin Neurologic: No headache,syncope, seizures, numbness , tingling Psychiatric: No significant anxiety, depression, insomnia, anorexia Endocrine: No change in hair/skin/ nails, excessive thirst, excessive hunger, excessive urination  Hematologic/lymphatic: No significant bruising, lymphadenopathy,abnormal bleeding Allergy/immunology: No itchy/ watery eyes, significant sneezing, urticaria, angioedema  Physical exam:  Pertinent or positive findings: Alert and Oriented to self, place, and president.  Unable to name date.  Tachypnea Patient seated upright with 2 pillows  Poor dentition  Low grade wet rhonchi asymmetrically throughout lung fields. Rub-like rhonchi in the right posterior chest. No abnormalities noted on whispered pectoriloquy or tactile fremitus exams. Distant heart sounds , respiratory variation to rhythm. Umbilical hernia. Stasis hyperpigmentation to bilateral lower extremities Yellow, thickened toenails  Sores to dorsal surface of left and right 5th digit of foot.  Wife reports upcoming podiatry appointment.  Flexion contracture of the fifth right finger  General appearance:Adequately nourished; no acute distress.   Lymphatic: No lymphadenopathy about the head, neck, axilla . Eyes: No conjunctival inflammation or lid edema is present. There is no scleral icterus. Ears:  External ear exam shows no significant lesions or deformities.   Nose:  External nasal examination shows no deformity or inflammation. Nasal mucosa are pink and moist without lesions ,exudates Oral exam: lips and gums are healthy appearing.There is no oropharyngeal erythema or exudate but tissues dry . Neck:  No thyromegaly, masses, tenderness noted.    Heart:  No gallop,  murmur, click, rub .  Abdomen:Bowel sounds are normal. Abdomen is soft and nontender with no organomegaly, masses. GU: deferred  Extremities:  No cyanosis, clubbing Neurologic exam : Cn 2-7 intact Strength equal  in upper &  lower extremities Skin: Warm & dry w/o tenting. No significant rash.   #1 exacerbation of COPD. The asymmetric rhonchi are worrisome for secretion retention and risk for pneumonia on the right. #2 biventricular congestive heart failure appears to be stable based on his exam and weight Plan: abbreviated course of prednisone orally as well as azithromycin. His PT/INR will be monitored closely. He was encouraged to use the Mucinex for thick secretions and to request the neb treatments. He will continue his incentive spirometry and flutter valve.

## 2016-12-01 NOTE — Patient Instructions (Signed)
See orders for acute diagnosis for this visit

## 2016-12-02 LAB — PROTIME-INR
PROTIME: 20.9 s — AB (ref 10.0–13.8)
Protime: 21.1 seconds — AB (ref 10.0–13.8)

## 2016-12-02 LAB — POCT INR
INR: 1.8 — AB (ref 0.9–1.1)
INR: 1.8 — AB (ref 0.9–1.1)

## 2016-12-05 LAB — PROTIME-INR: Protime: 23.5 seconds — AB (ref 10.0–13.8)

## 2016-12-05 LAB — POCT INR: INR: 2.1 — AB (ref 0.9–1.1)

## 2016-12-08 LAB — POCT INR: INR: 1.8 — AB (ref 0.9–1.1)

## 2016-12-08 LAB — PROTIME-INR: Protime: 21 s — AB (ref 10.0–13.8)

## 2016-12-09 ENCOUNTER — Non-Acute Institutional Stay (SKILLED_NURSING_FACILITY): Payer: PPO | Admitting: Nurse Practitioner

## 2016-12-09 DIAGNOSIS — N183 Chronic kidney disease, stage 3 unspecified: Secondary | ICD-10-CM

## 2016-12-09 DIAGNOSIS — J42 Unspecified chronic bronchitis: Secondary | ICD-10-CM | POA: Diagnosis not present

## 2016-12-09 DIAGNOSIS — I5032 Chronic diastolic (congestive) heart failure: Secondary | ICD-10-CM | POA: Diagnosis not present

## 2016-12-09 DIAGNOSIS — I482 Chronic atrial fibrillation, unspecified: Secondary | ICD-10-CM

## 2016-12-09 DIAGNOSIS — E1142 Type 2 diabetes mellitus with diabetic polyneuropathy: Secondary | ICD-10-CM

## 2016-12-09 DIAGNOSIS — S61201D Unspecified open wound of left index finger without damage to nail, subsequent encounter: Secondary | ICD-10-CM | POA: Diagnosis not present

## 2016-12-09 DIAGNOSIS — J209 Acute bronchitis, unspecified: Secondary | ICD-10-CM | POA: Diagnosis not present

## 2016-12-09 DIAGNOSIS — S61201A Unspecified open wound of left index finger without damage to nail, initial encounter: Secondary | ICD-10-CM | POA: Diagnosis not present

## 2016-12-09 NOTE — Progress Notes (Signed)
Nursing Home Location:    [facility]  Place of Service: SNF (31)  PCP: Leeroy Cha, MD  Allergies  Allergen Reactions  . Quinine Palpitations and Other (See Comments)    Seizure     Chief Complaint  Patient presents with  . Medical Management of Chronic Issues    Resident is being seen for routine visit.     HPI:  Patient is a 83 y.o. male seen today at  [facility] for routine follow up on chronic conditions. Pt with hx of CHF, COPD, OSA. Pt recently with COPD exacerbation and treatment with prednisone and azithromycin. Pt reports shortness of breath, cough and congestion back to baseline. Using cpap at night.  Staff reports pt with intermittent confusion, this has currently improved. Pt working with therapy for gait and strength training.   Review of Systems:  Review of Systems  Constitutional: Negative for activity change, appetite change, fatigue and unexpected weight change.  HENT: Negative for congestion and hearing loss.   Eyes: Negative.   Respiratory: Positive for cough and shortness of breath.        Shortness of breath, cough and congestion at baseline.   Cardiovascular: Negative for chest pain and palpitations.  Gastrointestinal: Negative for abdominal pain, constipation and diarrhea.  Genitourinary: Negative for difficulty urinating and dysuria.  Musculoskeletal: Negative for arthralgias and myalgias.  Skin: Negative for color change and wound.  Neurological: Positive for weakness. Negative for dizziness.  Psychiatric/Behavioral: Positive for confusion (at times). Negative for agitation and behavioral problems.    Past Medical History:  Diagnosis Date  . Atrial fibrillation ([place])   . Cerebrovascular disease   . CHF (congestive heart failure) ([place])   . CLL (chronic lymphocytic leukemia) ([place]) 11/17/2014  . COPD (chronic obstructive pulmonary disease) ([place])   . History of thrombocytopenia   . Obesity   . Osteoarthritis   . Other and unspecified  hyperlipidemia   . Type II or unspecified type diabetes mellitus without mention of complication, not stated as uncontrolled   . Unspecified essential hypertension    Past Surgical History:  Procedure Laterality Date  . KNEE ARTHROSCOPY W/ ALLOGRAFT IMPANT    . VASECTOMY     Social History:   reports that he quit smoking about 38 years ago. His smoking use included Cigarettes. He started smoking about 70 years ago. He has a 68.00 pack-year smoking history. He has never used smokeless tobacco. He reports that he does not drink alcohol. His drug history is not on file.  Family History  Problem Relation Age of Onset  . Heart disease Father     Medications: Patient's Medications  New Prescriptions   No medications on file  Previous Medications   ALBUTEROL (VENTOLIN HFA) 108 (90 BASE) MCG/ACT INHALER    Inhale 2 puffs into the lungs every 6 (six) hours as needed for wheezing or shortness of breath.   ALUM & MAG HYDROXIDE-SIMETH (MAALOX/MYLANTA) 200-200-20 MG/5ML SUSPENSION    Take 30 mLs by mouth as needed for indigestion or heartburn.   ATORVASTATIN (LIPITOR) 10 MG TABLET    Take 1 tablet (10 mg total) by mouth daily at 6 PM.   BUDESONIDE-FORMOTEROL (SYMBICORT) 160-4.5 MCG/ACT INHALER    Inhale 2 puffs into the lungs 2 (two) times daily. 2 puffs twice daily   DIVALPROEX (DEPAKOTE) 250 MG DR TABLET    Take 250 mg by mouth 2 (two) times daily.   GLIMEPIRIDE (AMARYL) 4 MG TABLET    Take 8 mg by  mouth daily with breakfast.   GUAIFENESIN (MUCINEX MAXIMUM STRENGTH) 1200 MG TB12    Take 1,200 mg by mouth 2 (two) times daily as needed (cough).   HYDROCODONE-ACETAMINOPHEN (NORCO/VICODIN) 5-325 MG TABLET    Take 1 tablet by mouth every 6 (six) hours as needed for moderate pain or severe pain.   INSULIN GLARGINE (LANTUS) 100 UNIT/ML SOLOSTAR PEN    Inject 18 Units into the skin daily at 10 pm.   IPRATROPIUM-ALBUTEROL (DUONEB) 0.5-2.5 (3) MG/3ML SOLN    Take 3 mLs by nebulization every 6 (six) hours  as needed.   LIDOCAINE (LIDODERM) 5 %    Place 1 patch onto the skin daily. Remove & Discard patch within 12 hours or as directed by MD   MELATONIN 3 MG TABS    Take two tablets by mouth once daily at bedtime as needed for sleep   METOPROLOL SUCCINATE (TOPROL-XL) 25 MG 24 HR TABLET    Take 2 tablets (50 mg total) by mouth daily.   NITROGLYCERIN (NITROSTAT) 0.4 MG SL TABLET    Place 1 tablet (0.4 mg total) under the tongue every 5 (five) minutes as needed for chest pain.   NUTRITIONAL SUPPLEMENTS (NUTRITIONAL SUPPLEMENT PO)    Take 120 mLs by mouth 3 (three) times daily. Medpass   OXYGEN    Inhale 3 L into the lungs continuous.    POLYETHYLENE GLYCOL (MIRALAX / GLYCOLAX) PACKET    Take 17 g by mouth daily.   POTASSIUM CHLORIDE SA (K-DUR,KLOR-CON) 20 MEQ TABLET    Take 1 tablet (20 mEq total) by mouth 2 (two) times daily.   SENNA-DOCUSATE (SENOKOT-S) 8.6-50 MG TABLET    Take 1 tablet by mouth at bedtime as needed for mild constipation.   TIOTROPIUM (SPIRIVA) 18 MCG INHALATION CAPSULE    Place 1 capsule (18 mcg total) into inhaler and inhale daily.   TORSEMIDE (DEMADEX) 20 MG TABLET    Take 2 tablets (40 mg total) by mouth 2 (two) times daily.   WARFARIN (COUMADIN) 2 MG TABLET    Take 2 mg by mouth on Mon, Wed, Fri, and Sun. Take 3 mg (1 1/2 tab) on Tue, Thurs, and Sat.  Modified Medications   No medications on file  Discontinued Medications   DIVALPROEX (DEPAKOTE SPRINKLE) 125 MG CAPSULE    Take 125mg  by mouth at bedtime for 3 days (start 11/30/16) then 250mg  by mouth at bedtime for 3 days then take twice daily.   FEEDING SUPPLEMENT, GLUCERNA SHAKE, (GLUCERNA SHAKE) LIQD    Take 237 mLs by mouth 3 (three) times daily between meals.     Physical Exam: Vitals:   12/09/16 1118  BP: 132/74  Pulse: 83  Resp: 19  Temp: (!) 96.7 F (35.9 C)  SpO2: 95%  Weight: 251 lb 3.2 oz (113.9 kg)  Height: 6\' 2"  (1.88 m)    Physical Exam  Constitutional: He appears well-developed and well-nourished. No  distress.  Frail, ill appearing  HENT:  Head: Normocephalic and atraumatic.  Mouth/Throat: Oropharynx is clear and moist. No oropharyngeal exudate.  Eyes: Conjunctivae and EOM are normal. Pupils are equal, round, and reactive to light.  Neck: Normal range of motion. Neck supple.  Cardiovascular: Normal rate, regular rhythm and normal heart sounds.   Pulmonary/Chest: Effort normal.  Diminished throughout   Abdominal: Soft. Bowel sounds are normal.  Musculoskeletal: He exhibits no edema or tenderness.  Stasis hyperpigmentation to bilateral lower extremities  Neurological: He is alert.  Skin: Skin is warm and dry.  He is not diaphoretic.  Psychiatric: He has a normal mood and affect.    Labs reviewed: Basic Metabolic Panel:  Recent Labs  10/30/16 0238 10/31/16 0226  11/01/16 0224 11/02/16 0222  11/06/16 0236 11/14/16 0353 11/15/16 11/17/16 1251  NA 134* 141  < > 142 143  < > 137 140 146 145  K 3.8 3.3*  < > 3.1* 3.6  < > 4.4 4.3 3.3* 4.0  CL 83* 92*  < > 90* 90*  < > 95* 97*  --  99  CO2 33* 35*  < > 40* 43*  < > 32 33*  --  33  GLUCOSE 376* 157*  < > 151* 138*  < > 209* 140*  --  135*  BUN 105* 94*  < > 70* 49*  < > 43* 35* 31* 33*  CREATININE 2.21* 1.84*  < > 1.55* 1.38*  < > 1.48* 1.50* 1.1 1.5*  CALCIUM 8.5* 8.7*  < > 9.3 9.2  < > 8.6* 8.6*  --  9.3  MG 1.9 2.2  --  2.4 2.1  --   --   --   --   --   PHOS 4.6  --   --   --   --   --   --   --   --   --   < > = values in this interval not displayed. Liver Function Tests:  Recent Labs  10/31/16 0226 11/01/16 0224 11/17/16 1251  AST 26 22 34  ALT 17 16* 50*  ALKPHOS 60 57 98*  BILITOT 0.8 0.9 0.90  PROT 6.5 6.4* 6.7  ALBUMIN 2.7* 2.9* 3.1*   No results for input(s): LIPASE, AMYLASE in the last 8760 hours.  Recent Labs  10/30/16 0903  AMMONIA 19   CBC:  Recent Labs  11/01/16 0224  11/06/16 0236 11/14/16 0353 11/17/16 1251  WBC 25.9*  < > 27.9* 26.2* 27.1*  NEUTROABS 11.1*  --   --  8.9* 10.1*  HGB  13.1  < > 13.6 14.7 14.5  HCT 40.4  < > 41.1 43.7 43.4  MCV 93.1  < > 91.5 92.0 93  PLT 135*  < > 135* 143* 150  < > = values in this interval not displayed. TSH:  Recent Labs  10/30/16 0903  TSH 0.682   A1C: Lab Results  Component Value Date   HGBA1C 7.7 (H) 10/30/2016   Lipid Panel:  Recent Labs  09/15/16 0434  CHOL 80  HDL 23*  LDLCALC 40  TRIG 86  CHOLHDL 3.5   Lab Results  Component Value Date   INR 1.8 (A) 12/08/2016   INR 2.1 (A) 12/05/2016   INR 1.8 (A) 12/02/2016   INR 1.8 (A) 12/02/2016   PROTIME 21.0 (A) 12/08/2016   PROTIME 23.5 (A) 12/05/2016   PROTIME 20.9 (A) 12/02/2016   PROTIME 21.1 (A) 12/02/2016     Assessment/Plan 1. Chronic bronchitis with acute exacerbation (HCC) Acute exacerbation has improved. Completed prednisone and Azithromycin. Reports pulmonary status back to baseline. conts on Symbicort, spiriva with albuterol PRN.   2. Chronic atrial fibrillation (HCC) Rate controlled on toprol-XL, conts on coumadin for anticoagulation   3. Chronic diastolic CHF (congestive heart failure) (HCC) euvolemic conts on demedex with potassium and toprol  4. Diabetic polyneuropathy associated with type 2 diabetes mellitus (HCC) A1c 7.7, goal for age and comorbities, conts on lantus with Amaryl.   5. Chronic kidney disease (CKD), stage III (moderate) BUN/Cr at baseline, will  cont to monitor.     Carlos American. Harle Battiest  [facility] [phone] 8 am - 5 pm) [phone] (after hours)

## 2016-12-13 DIAGNOSIS — S61201D Unspecified open wound of left index finger without damage to nail, subsequent encounter: Secondary | ICD-10-CM | POA: Diagnosis not present

## 2016-12-13 DIAGNOSIS — S61201A Unspecified open wound of left index finger without damage to nail, initial encounter: Secondary | ICD-10-CM | POA: Diagnosis not present

## 2016-12-15 ENCOUNTER — Encounter: Payer: Self-pay | Admitting: Internal Medicine

## 2016-12-15 ENCOUNTER — Non-Acute Institutional Stay (SKILLED_NURSING_FACILITY): Payer: PPO | Admitting: Internal Medicine

## 2016-12-15 DIAGNOSIS — Z91199 Patient's noncompliance with other medical treatment and regimen due to unspecified reason: Secondary | ICD-10-CM

## 2016-12-15 DIAGNOSIS — R682 Dry mouth, unspecified: Secondary | ICD-10-CM

## 2016-12-15 DIAGNOSIS — K117 Disturbances of salivary secretion: Secondary | ICD-10-CM

## 2016-12-15 DIAGNOSIS — Z9119 Patient's noncompliance with other medical treatment and regimen: Secondary | ICD-10-CM

## 2016-12-15 DIAGNOSIS — R600 Localized edema: Secondary | ICD-10-CM | POA: Diagnosis not present

## 2016-12-15 LAB — PROTIME-INR: Protime: 19.8 seconds — AB (ref 10.0–13.8)

## 2016-12-15 LAB — POCT INR: INR: 1.7 — AB (ref 0.9–1.1)

## 2016-12-15 NOTE — Progress Notes (Signed)
[facility] Room: 107  PCP: Leeroy Cha, MD [address] [place] [address]  This is a nursing facility follow up for specific acute issue of edema.  Interim medical record and care since last [place] visit was updated with review of diagnostic studies and change in clinical status since last visit were documented.  HPI: The edema has been a chronic, recurrent problem. Staff feels that this is related to his noncompliance with fluid restriction ; he freely admits that this is the case. According to his cardiologist, Dr. Aundra Dubin, fluid restriction is 2 L per day. According to staff he drinks from the bathroom faucet and his wife repeatedly suppplies him with large cups of ice. His fluid restriction was verified by webmail contact with his cardiologist. He is on Demadex 40 mg 2 times a day. He is not on amlodipine. Renal function was last checked 11/17/16. Creatinine was 1.5 and GFR 42. He is on a sulfonylurea, diabetic control is adequate based on A1c of 7.7% on 2/11 He is on warfarin, last PT/INR was 1.8 on 3/22. Repeat is pending today.  Review of systems:  Pertinent positives:  Reports xerostomia unrelieved with ice chips, contributing to his poor compliance with fluid restriction.  Chronic cough without sputum production.  Shortness of breath that is chronic and dyspnea on exertion. By his report these are stable.  Constitutional: No fever,significant weight change, fatigue  Eyes: No redness, discharge, pain, vision change ENT/mouth: No nasal congestion,  purulent discharge, earache,change in hearing ,sore throat  Cardiovascular: No chest pain, palpitations,paroxysmal nocturnal dyspnea, claudication  Respiratory: No hemoptysis, significant snoring,apnea   Gastrointestinal: No heartburn,dysphagia,abdominal pain, nausea / vomiting,rectal bleeding, melena,change in bowels Genitourinary: No dysuria,hematuria, pyuria,   incontinence, nocturia Musculoskeletal: No joint stiffness, joint swelling, weakness,pain Dermatologic: No rash, pruritus, change in appearance of skin Neurologic: No dizziness,headache,syncope, seizures, numbness , tingling Psychiatric: No significant anxiety , depression, insomnia, anorexia Endocrine: No change in hair/skin/ nails, excessive thirst, excessive hunger, excessive urination  Hematologic/lymphatic: No lymphadenopathy,abnormal bleeding Allergy/immunology: No itchy/ watery eyes, significant sneezing, urticaria, angioedema  Physical exam:  Pertinent or positive findings: Patient leaning forward with mild increased work of breathing.  Ptosis OS greater than OD Pattern alopecia Dry mucous membranes in mouth Clubbing of fingernails Right fifth digit of hand flexion contracture Diffuse ecchymosis of bilateral upper extremities  Distant heart sounds , heard best in the epigastrium, slight Rhonchi in bilateral upper lung fields, diminished bases Purplish discoloration of bilateral lower extremities.   Wound to left medial shin, covered in dressing and followed by wound care.  Skin tight to bilateral lower extremities with 2+ pitting edema.   General appearance:Adequately nourished; no acute distress. Lymphatic: No lymphadenopathy about the head, neck, axilla . Eyes: No conjunctival inflammation or lid edema is present. There is no scleral icterus. Ears:  External ear exam shows no significant lesions or deformities.   Nose:  External nasal examination shows no deformity or inflammation. Nasal mucosa are pink and moist without lesions ,exudates Oral exam: There is no oropharyngeal erythema or exudate . Neck:  No thyromegaly, masses, tenderness noted.    Heart:  Normal rate . S1 and S2 normal without gallop, murmur, click, rub .  Lungs:Chest without wheezes, rales , rubs. Abdomen:Bowel sounds are normal. Abdomen is soft and nontender with no organomegaly, hernias,masses. GU:  deferred  Extremities:  No cyanosis Skin: Warm & dry w/o tenting. No significant lesions or rash.  #1) Bilateral lower extremity  edema.  Patient reports drinking more than fluid restriction.  Educated patient and wife about relationship between fluid restriction and edema.  Continue Demadex 40mg  twice daily.    #2) xerostomia contributing to poor compliance with fluid restriction-  Biotene hydrating oral rinse every 6 hours as needed.  Sugar-free candies recommended to increase salivation.

## 2016-12-15 NOTE — Patient Instructions (Signed)
Pathophysiology of CHF and the need for fluid restriction was discussed with him & wife. He seems to be very motivated to implement these restrictions Follow-up with his cardiologist

## 2016-12-17 DIAGNOSIS — R0602 Shortness of breath: Secondary | ICD-10-CM | POA: Diagnosis not present

## 2016-12-17 DIAGNOSIS — J9610 Chronic respiratory failure, unspecified whether with hypoxia or hypercapnia: Secondary | ICD-10-CM | POA: Diagnosis not present

## 2016-12-17 DIAGNOSIS — G4733 Obstructive sleep apnea (adult) (pediatric): Secondary | ICD-10-CM | POA: Diagnosis not present

## 2016-12-20 DIAGNOSIS — S61201A Unspecified open wound of left index finger without damage to nail, initial encounter: Secondary | ICD-10-CM | POA: Diagnosis not present

## 2016-12-20 DIAGNOSIS — S61201D Unspecified open wound of left index finger without damage to nail, subsequent encounter: Secondary | ICD-10-CM | POA: Diagnosis not present

## 2016-12-21 ENCOUNTER — Encounter: Payer: Self-pay | Admitting: Podiatry

## 2016-12-21 ENCOUNTER — Ambulatory Visit (INDEPENDENT_AMBULATORY_CARE_PROVIDER_SITE_OTHER): Payer: PPO | Admitting: Podiatry

## 2016-12-21 ENCOUNTER — Telehealth (HOSPITAL_COMMUNITY): Payer: Self-pay | Admitting: Cardiology

## 2016-12-21 DIAGNOSIS — L97511 Non-pressure chronic ulcer of other part of right foot limited to breakdown of skin: Secondary | ICD-10-CM | POA: Diagnosis not present

## 2016-12-21 DIAGNOSIS — E1149 Type 2 diabetes mellitus with other diabetic neurological complication: Secondary | ICD-10-CM | POA: Diagnosis not present

## 2016-12-21 DIAGNOSIS — M79675 Pain in left toe(s): Secondary | ICD-10-CM

## 2016-12-21 DIAGNOSIS — B351 Tinea unguium: Secondary | ICD-10-CM | POA: Diagnosis not present

## 2016-12-21 DIAGNOSIS — M79674 Pain in right toe(s): Secondary | ICD-10-CM | POA: Diagnosis not present

## 2016-12-21 LAB — PROTIME-INR: PROTIME: 18.6 s — AB (ref 10.0–13.8)

## 2016-12-21 LAB — POCT INR: INR: 1.6 — AB (ref 0.9–1.1)

## 2016-12-21 MED ORDER — MUPIROCIN 2 % EX OINT: 1.0000 "application " | TOPICAL_OINTMENT | Freq: Two times a day (BID) | CUTANEOUS | 0 refills | Status: DC

## 2016-12-21 NOTE — Progress Notes (Signed)
Subjective: 83 y.o. returns the office today for painful, elongated, thickened toenails which he cannot trim himself. Denies any redness or drainage around the nails. He is currently in a rehab facility. Denies any acute changes since last appointment and no new complaints today. Denies any systemic complaints such as fevers, chills, nausea, vomiting.   Objective: AAO 3, NAD; in wheelchair DP/PT pulses palpable, CRT less than 3 seconds  Nails hypertrophic, dystrophic, elongated, brittle, discolored 10. There is tenderness overlying the nails 1-5 bilaterally. There is no surrounding erythema or drainage along the nail sites. On the lateral aspect of the right fifth toe is a superficial wound. This or drainage or pus there is no surrounding erythema, ascending cellulitis. Dried blood with all the toenails. Significant venous changes are present No other areas of tenderness bilateral lower extremities. No overlying edema, erythema, increased warmth. No pain with calf compression, swelling, warmth, erythema.  Assessment: Patient presents with symptomatic onychomycosis; right fifth toe ulceration, venous insufficiency  Plan: -Treatment options including alternatives, risks, complications were discussed -Nails sharply debrided 10 without complication/bleeding. -Bactroban ointment to the right fifth toe daily.-Discussed daily foot inspection. If there are any changes, to call the office immediately.  -Follow-up in 3 weeks for wound check or sooner if any problems are to arise. In the meantime, encouraged to call the office with any questions, concerns, changes symptoms.  Celesta Gentile, DPM

## 2016-12-21 NOTE — Telephone Encounter (Signed)
Per Kevan Rosebush, RN called to get patient appt with APP per Dr. Aundra Dubin.  LM for patient to call back so we can schedule appt.

## 2016-12-23 NOTE — Telephone Encounter (Signed)
Spoke with patient's wife.  Pt is scheduled 12/26/16 @1 :40 pm w/APP.

## 2016-12-26 ENCOUNTER — Ambulatory Visit (HOSPITAL_BASED_OUTPATIENT_CLINIC_OR_DEPARTMENT_OTHER)
Admission: RE | Admit: 2016-12-26 | Discharge: 2016-12-26 | Disposition: A | Discharge status: Home / Self Care | Payer: PPO | Source: Ambulatory Visit | Attending: Cardiology | Admitting: Cardiology

## 2016-12-26 ENCOUNTER — Ambulatory Visit (HOSPITAL_COMMUNITY)
Admission: RE | Admit: 2016-12-26 | Discharge: 2016-12-26 | Disposition: A | Discharge status: Home / Self Care | Payer: PPO | Source: Ambulatory Visit | Attending: Cardiology | Admitting: Cardiology

## 2016-12-26 ENCOUNTER — Encounter (HOSPITAL_COMMUNITY): Payer: Self-pay

## 2016-12-26 VITALS — BP 130/90 | HR 85 | Wt 231.4 lb

## 2016-12-26 DIAGNOSIS — C9110 Chronic lymphocytic leukemia of B-cell type not having achieved remission: Secondary | ICD-10-CM | POA: Insufficient documentation

## 2016-12-26 DIAGNOSIS — I5033 Acute on chronic diastolic (congestive) heart failure: Secondary | ICD-10-CM | POA: Insufficient documentation

## 2016-12-26 DIAGNOSIS — N183 Chronic kidney disease, stage 3 unspecified: Secondary | ICD-10-CM

## 2016-12-26 DIAGNOSIS — I482 Chronic atrial fibrillation: Secondary | ICD-10-CM | POA: Insufficient documentation

## 2016-12-26 DIAGNOSIS — G4733 Obstructive sleep apnea (adult) (pediatric): Secondary | ICD-10-CM | POA: Insufficient documentation

## 2016-12-26 DIAGNOSIS — Z8673 Personal history of transient ischemic attack (TIA), and cerebral infarction without residual deficits: Secondary | ICD-10-CM | POA: Insufficient documentation

## 2016-12-26 DIAGNOSIS — R918 Other nonspecific abnormal finding of lung field: Secondary | ICD-10-CM

## 2016-12-26 DIAGNOSIS — E1122 Type 2 diabetes mellitus with diabetic chronic kidney disease: Secondary | ICD-10-CM | POA: Insufficient documentation

## 2016-12-26 DIAGNOSIS — E669 Obesity, unspecified: Secondary | ICD-10-CM | POA: Insufficient documentation

## 2016-12-26 DIAGNOSIS — E785 Hyperlipidemia, unspecified: Secondary | ICD-10-CM

## 2016-12-26 DIAGNOSIS — I130 Hypertensive heart and chronic kidney disease with heart failure and stage 1 through stage 4 chronic kidney disease, or unspecified chronic kidney disease: Secondary | ICD-10-CM

## 2016-12-26 DIAGNOSIS — Z7901 Long term (current) use of anticoagulants: Secondary | ICD-10-CM | POA: Insufficient documentation

## 2016-12-26 DIAGNOSIS — I5032 Chronic diastolic (congestive) heart failure: Secondary | ICD-10-CM | POA: Diagnosis not present

## 2016-12-26 DIAGNOSIS — I10 Essential (primary) hypertension: Secondary | ICD-10-CM

## 2016-12-26 DIAGNOSIS — R0600 Dyspnea, unspecified: Secondary | ICD-10-CM

## 2016-12-26 DIAGNOSIS — Z96651 Presence of right artificial knee joint: Secondary | ICD-10-CM | POA: Insufficient documentation

## 2016-12-26 DIAGNOSIS — Z794 Long term (current) use of insulin: Secondary | ICD-10-CM

## 2016-12-26 DIAGNOSIS — R05 Cough: Secondary | ICD-10-CM | POA: Diagnosis not present

## 2016-12-26 DIAGNOSIS — Z6829 Body mass index (BMI) 29.0-29.9, adult: Secondary | ICD-10-CM | POA: Insufficient documentation

## 2016-12-26 DIAGNOSIS — Z7951 Long term (current) use of inhaled steroids: Secondary | ICD-10-CM

## 2016-12-26 DIAGNOSIS — Z87891 Personal history of nicotine dependence: Secondary | ICD-10-CM

## 2016-12-26 DIAGNOSIS — R059 Cough, unspecified: Secondary | ICD-10-CM

## 2016-12-26 DIAGNOSIS — Z79899 Other long term (current) drug therapy: Secondary | ICD-10-CM | POA: Insufficient documentation

## 2016-12-26 LAB — BASIC METABOLIC PANEL
Anion gap: 11 (ref 5–15)
BUN: 26 mg/dL — ABNORMAL HIGH (ref 6–20)
CALCIUM: 8.8 mg/dL — AB (ref 8.9–10.3)
CO2: 31 mmol/L (ref 22–32)
CREATININE: 1.52 mg/dL — AB (ref 0.61–1.24)
Chloride: 97 mmol/L — ABNORMAL LOW (ref 101–111)
GFR, EST AFRICAN AMERICAN: 48 mL/min — AB (ref >60)
GFR, EST NON AFRICAN AMERICAN: 41 mL/min — AB (ref >60)
Glucose, Bld: 195 mg/dL — ABNORMAL HIGH (ref 65–99)
Potassium: 4.4 mmol/L (ref 3.5–5.1)
SODIUM: 139 mmol/L (ref 135–145)

## 2016-12-26 LAB — BRAIN NATRIURETIC PEPTIDE: B NATRIURETIC PEPTIDE 5: 112.8 pg/mL — AB (ref 0.0–100.0)

## 2016-12-26 MED ORDER — POTASSIUM CHLORIDE CRYS ER 20 MEQ PO TBCR: 40.0000 meq | EXTENDED_RELEASE_TABLET | Freq: Two times a day (BID) | ORAL | 6 refills | Status: DC

## 2016-12-26 MED ORDER — METOLAZONE 2.5 MG PO TABS: 2.5000 mg | ORAL_TABLET | Freq: Every day | ORAL | 1 refills | Status: DC

## 2016-12-26 MED ORDER — TORSEMIDE 20 MG PO TABS: 60.0000 mg | ORAL_TABLET | Freq: Two times a day (BID) | ORAL | 6 refills | Status: DC

## 2016-12-26 NOTE — Patient Instructions (Addendum)
Vest today 49%.  Chest xray today for shortness of breath.  Wear compression stockings as much as tolerated for lower extremity swelling. Remove for bathing and sleeping.  Take Metolazone 2.5 mg tablet once today.  INCREASE Torsemide to 60 mg (3 tabs) twice daily.  INCREASE Potassium 40 meq (2 tabs) twice daily.  Follow up next week.  Do the following things EVERYDAY: 1) Weigh yourself in the morning before breakfast. Write it down and keep it in a log. 2) Take your medicines as prescribed 3) Eat low salt foods-Limit salt (sodium) to 2000 mg per day.  4) Stay as active as you can everyday 5) Limit all fluids for the day to less than 2 liters

## 2016-12-26 NOTE — Progress Notes (Signed)
    ReDS Vest - 12/26/16 1400      ReDS Vest   MR  No   Fitting Posture Sitting   Height Marker Tall   Ruler Value 6   Center Strip Aligned   ReDS Value 49

## 2016-12-26 NOTE — Progress Notes (Signed)
Patient ID: William Braun, male   DOB: Sep 21, 1932, 83 y.o.   MRN: [id number] PCP: Dr. Michail Sermon Pulmonary: Dr Halford Chessman Cardiology: Dr. Aundra Dubin  83 yo with h/o HTN, CVA, diabetes, obesity-hypoventilation syndrome, diastolic CHF, and chronic atrial fibrillation presents for cardiology followup.  He has OHS and uses BIPAP at night and oxygen during the day.  He has atrial fibrillation with reasonable rate control and is on coumadin.   His legs continue to feel weak (normal ABIs in 10/16).    He was admitted in 12/17 with COPD exacerbation/LLL PNA. He was admitted again in 2/18 with UTI/encephalopathy and AKI.  Torsemide was decreased to 40 mg bid. Residing at [facility] facility.   Returns today for HF follow up. Weighs daily at [place], weight ranging 250-257 pounds. Weight today in the clinic is 231 pounds, this is inaccurate. He was unable to stand on the scale long enough to rezero to get an accurate weight.  Using 2 pillows for sleep, endorses PND. Eating a low sodium diet at [facility], drinking more than 2L a day. Eats a lot of ice. Working with PT at [facility], feels SOB with chair exercises. Not able to walk with walker. On 3L o2, goes to 4L at times when he feels SOB.Has a productive cough today worse in the past few days. Afebrile, his wife is concerned and wants him to have a chest x ray.    Labs (2/10): creatinine 1.15, BNP 73, TSH normal  Labs (9/12): K 4.1, creatinine 1.4, BNP 90 Labs (11/12): K 4.2, creatinine 1.9 Labs (2/13): K 4.2, creatinine 1.3, proBNP 49, LDL 39, HDL 35 Labs (4/13): K 4, creatinine 1.5, BNP 48 Labs (3/14): K 4 => 3.9, creatinine 1.4 => 1.6 Labs (4/14): K 4, creatinine 1.5 Labs (5/15): K 4.2, creatinine 1.4, BNP 49 Labs (8/15): K 3.7, creatinine 1.4 Labs (10/15): K 3.6, creatinine 1.29 Labs (8/16): K 4.7, creatinine 1.34, HCT 47.9 Labs (11/16): K 4, creatinine 1.23 => 1.39, BNP 78 Labs (2/17): K 3.5, creatinine 1.85 Labs (3/17): K 4.8, creatinine  1.69, BNP 56.5 Labs (8/17): K 4.5, creatinine 1.73 Labs (3/18): K 4, creatinine 1.5, hgb 14.5  Allergies (verified):  1) ! Quinine   Past Medical History:  1. Atrial Fibrillation: apparently developed post-op right TKR in 2/10. Pt was started on coumadin. Now chronic.  2. Diabetes Type 2  3. Hyperlipidemia  4. Hypertension  5. Cerebrovascular Disease-CVA-2008  6. Obesity  7. Osteoarthritis left knee, s/p R TKR  8. History of thrombocytopenia  9. Diastolic CHF: Echo (1/77) with EF 55%, moderately dilated RV with moderate RV systolic dysfunction, PA systolic pressure 36 mmHg.  Echo (3/14) with EF 60-65%, moderate LVH, moderate RV dilation with normal systolic function, PA systolic pressure 93-90 mmHg.  Echo (11/16) with EF 60-65%, mild LVH, mild aortic stenosis, mildly dilated aortic root 4.3 cm, PASP 71 mmHg.  10. Obesity hypoventilation syndrome/OSA: BIPAP at night, O2 during the day with exertion. PFTs (1/15) with FEV1 49%, ratio 97%, TLC 59%, DLCO 49% => mixed obstructive/restrictive.  11. Prolonged hospitalization in fall 2011 with Strep agalactiae bacteremia, MRSA PNA, and septic shock.  12. Lexiscan myoview (11/12) with EF 65%, no ischemia or infarction.  13. 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  14. CKD 15. CLL: Dr. Marin Olp 16. ABIs (7/15) normal.  ABIs (10/16) normal.   Family History:  Father: deceased MVA  Mother: deceased MVA   Social History:  Married  Tobacco Use - Former. -  quit >35 years ago  3 children  Former truck driver  ROS: All systems reviewed and negative except as per HPI.   Current Outpatient Prescriptions  Medication Sig Dispense Refill  . albuterol (VENTOLIN HFA) 108 (90 BASE) MCG/ACT inhaler Inhale 2 puffs into the lungs every 6 (six) hours as needed for wheezing or shortness of breath. 3 Inhaler 1  . alum & mag hydroxide-simeth (MAALOX/MYLANTA) 200-200-20 MG/5ML suspension Take 30 mLs by mouth as needed for indigestion or heartburn. 355  mL 0  . atorvastatin (LIPITOR) 10 MG tablet Take 1 tablet (10 mg total) by mouth daily at 6 PM. 30 tablet 0  . budesonide-formoterol (SYMBICORT) 160-4.5 MCG/ACT inhaler Inhale 2 puffs into the lungs 2 (two) times daily. 2 puffs twice daily 3 Inhaler 3  . divalproex (DEPAKOTE) 250 MG DR tablet Take 250 mg by mouth 2 (two) times daily.    Marland Kitchen glimepiride (AMARYL) 4 MG tablet Take 8 mg by mouth daily with breakfast.    . Guaifenesin (MUCINEX MAXIMUM STRENGTH) 1200 MG TB12 Take 1,200 mg by mouth 2 (two) times daily as needed (cough).    Marland Kitchen HYDROcodone-acetaminophen (NORCO/VICODIN) 5-325 MG tablet Take 1 tablet by mouth every 6 (six) hours as needed for moderate pain or severe pain. 120 tablet 0  . Insulin Glargine (LANTUS) 100 UNIT/ML Solostar Pen Inject 18 Units into the skin daily at 10 pm. 15 mL 0  . ipratropium-albuterol (DUONEB) 0.5-2.5 (3) MG/3ML SOLN Take 3 mLs by nebulization every 6 (six) hours as needed. 360 mL 6  . lidocaine (LIDODERM) 5 % Place 1 patch onto the skin daily. Remove & Discard patch within 12 hours or as directed by MD 30 patch 0  . Melatonin 3 MG TABS Take two tablets by mouth once daily at bedtime as needed for sleep    . metoprolol succinate (TOPROL-XL) 25 MG 24 hr tablet Take 2 tablets (50 mg total) by mouth daily. 60 tablet 0  . mupirocin ointment (BACTROBAN) 2 % Apply 1 application topically 2 (two) times daily. 22 g 0  . nitroGLYCERIN (NITROSTAT) 0.4 MG SL tablet Place 1 tablet (0.4 mg total) under the tongue every 5 (five) minutes as needed for chest pain. 25 tablet 3  . Nutritional Supplements (NUTRITIONAL SUPPLEMENT PO) Take 120 mLs by mouth 3 (three) times daily. Medpass    . OXYGEN Inhale 3 L into the lungs continuous.     . polyethylene glycol (MIRALAX / GLYCOLAX) packet Take 17 g by mouth daily. 14 each 0  . potassium chloride SA (K-DUR,KLOR-CON) 20 MEQ tablet Take 1 tablet (20 mEq total) by mouth 2 (two) times daily. 60 tablet 0  . senna-docusate (SENOKOT-S) 8.6-50  MG tablet Take 1 tablet by mouth at bedtime as needed for mild constipation. 30 tablet 0  . tiotropium (SPIRIVA) 18 MCG inhalation capsule Place 1 capsule (18 mcg total) into inhaler and inhale daily. 90 capsule 3  . torsemide (DEMADEX) 20 MG tablet Take 2 tablets (40 mg total) by mouth 2 (two) times daily. 60 tablet 0  . warfarin (COUMADIN) 2 MG tablet Take 2 mg by mouth on Mon, Wed, Fri, and Sun. Take 3 mg (1 1/2 tab) on Tue, Thurs, and Sat.     No current facility-administered medications for this encounter.     BP 130/90 (BP Location: Left Arm, Patient Position: Sitting, Cuff Size: Normal)   Pulse 85   Wt 231 lb 6.4 oz (105 kg)   SpO2 90% Comment: on 2L  BMI  29.71 kg/m .  General: Elderly male, NAD, arrived in wheelchair, wife present.  Neck: JVP to jaw, no thyromegaly.  Lungs:Clear in upper lobes, coarse crackles in bases.  + cough. On 3L o2.  CV: Nondisplaced PMI.  Heart rate irregular. 2/6 systolic murmur. 2+ pedal and pretibial edema.  No carotid bruit.   Abdomen: Soft, nontender, no hepatosplenomegaly, no distention.  Neurologic: Alert and oriented x 3.  Psych: Normal affect. Extremities: No clubbing or cyanosis, warm. Chronic venous stasis changes bilaterally.   Assessment/Plan: 1. Acute on chronic diastolic CHF: Probably with a component of pulmonary arterial HTN from OHS/OSA with right heart failure.  - NYHA Class III, suspect OSA/OHS plays a role in his dyspnea. However, he does appear volume overloaded today. His weight is stable, but with JVP to jaw and lower extremity edema.  - Give one dose of metolazone 2.5mg  today. REDs vest today - 49. - Increase torsemide to 60mg  BID.  - Increase Kcl to 62mEq BID.  - BMET, BNP today - Apply TED hose, Rx provided.  2. OHS/OSA: Continue nocturnal Bipap and oxygen during the day.   3. Atrial fibrillation: Rate controlled.  - Continue coumadin.  4. HTN: BP elevated today. Will hold off on increasing beta blocker with volume  overload.  5. CKD: Stage III.   - Stable, baseline 1.3-1.5 6. Cough: Will get chest x ray today.   Follow up in one week with BMET  Arbutus Leas 12/26/2016

## 2016-12-27 ENCOUNTER — Telehealth (HOSPITAL_COMMUNITY): Payer: Self-pay | Admitting: Cardiology

## 2016-12-27 DIAGNOSIS — S61201A Unspecified open wound of left index finger without damage to nail, initial encounter: Secondary | ICD-10-CM | POA: Diagnosis not present

## 2016-12-27 DIAGNOSIS — S61201D Unspecified open wound of left index finger without damage to nail, subsequent encounter: Secondary | ICD-10-CM | POA: Diagnosis not present

## 2016-12-27 DIAGNOSIS — I4891 Unspecified atrial fibrillation: Secondary | ICD-10-CM | POA: Diagnosis not present

## 2016-12-27 NOTE — Telephone Encounter (Signed)
Spoke with and forwarded chest X ray results to Sherrie Mustache, NP with [facility] as they are directing care while the patient is at [facility].

## 2016-12-28 ENCOUNTER — Emergency Department (HOSPITAL_COMMUNITY): Payer: PPO

## 2016-12-28 ENCOUNTER — Encounter: Payer: Self-pay | Admitting: Nurse Practitioner

## 2016-12-28 ENCOUNTER — Non-Acute Institutional Stay (SKILLED_NURSING_FACILITY): Payer: PPO | Admitting: Nurse Practitioner

## 2016-12-28 ENCOUNTER — Inpatient Hospital Stay (HOSPITAL_COMMUNITY)
Admission: EM | Admit: 2016-12-28 | Discharge: 2017-01-03 | DRG: 871 | Disposition: A | Discharge status: Skilled Nursing Facility | Payer: PPO | Attending: Internal Medicine | Admitting: Internal Medicine

## 2016-12-28 DIAGNOSIS — Z66 Do not resuscitate: Secondary | ICD-10-CM | POA: Diagnosis present

## 2016-12-28 DIAGNOSIS — I50813 Acute on chronic right heart failure: Secondary | ICD-10-CM | POA: Diagnosis present

## 2016-12-28 DIAGNOSIS — M25531 Pain in right wrist: Secondary | ICD-10-CM

## 2016-12-28 DIAGNOSIS — Z6831 Body mass index (BMI) 31.0-31.9, adult: Secondary | ICD-10-CM

## 2016-12-28 DIAGNOSIS — R000 Tachycardia, unspecified: Secondary | ICD-10-CM | POA: Diagnosis not present

## 2016-12-28 DIAGNOSIS — Z87440 Personal history of urinary (tract) infections: Secondary | ICD-10-CM

## 2016-12-28 DIAGNOSIS — E1122 Type 2 diabetes mellitus with diabetic chronic kidney disease: Secondary | ICD-10-CM | POA: Diagnosis present

## 2016-12-28 DIAGNOSIS — Z888 Allergy status to other drugs, medicaments and biological substances status: Secondary | ICD-10-CM

## 2016-12-28 DIAGNOSIS — N179 Acute kidney failure, unspecified: Secondary | ICD-10-CM | POA: Diagnosis present

## 2016-12-28 DIAGNOSIS — R0602 Shortness of breath: Secondary | ICD-10-CM | POA: Diagnosis not present

## 2016-12-28 DIAGNOSIS — J449 Chronic obstructive pulmonary disease, unspecified: Secondary | ICD-10-CM | POA: Diagnosis present

## 2016-12-28 DIAGNOSIS — Z794 Long term (current) use of insulin: Secondary | ICD-10-CM

## 2016-12-28 DIAGNOSIS — M25431 Effusion, right wrist: Secondary | ICD-10-CM | POA: Insufficient documentation

## 2016-12-28 DIAGNOSIS — E1149 Type 2 diabetes mellitus with other diabetic neurological complication: Secondary | ICD-10-CM | POA: Diagnosis present

## 2016-12-28 DIAGNOSIS — N3001 Acute cystitis with hematuria: Secondary | ICD-10-CM | POA: Diagnosis present

## 2016-12-28 DIAGNOSIS — I130 Hypertensive heart and chronic kidney disease with heart failure and stage 1 through stage 4 chronic kidney disease, or unspecified chronic kidney disease: Secondary | ICD-10-CM | POA: Diagnosis present

## 2016-12-28 DIAGNOSIS — R0989 Other specified symptoms and signs involving the circulatory and respiratory systems: Secondary | ICD-10-CM

## 2016-12-28 DIAGNOSIS — Z7901 Long term (current) use of anticoagulants: Secondary | ICD-10-CM

## 2016-12-28 DIAGNOSIS — L03113 Cellulitis of right upper limb: Secondary | ICD-10-CM | POA: Diagnosis present

## 2016-12-28 DIAGNOSIS — Z7951 Long term (current) use of inhaled steroids: Secondary | ICD-10-CM

## 2016-12-28 DIAGNOSIS — C9110 Chronic lymphocytic leukemia of B-cell type not having achieved remission: Secondary | ICD-10-CM | POA: Diagnosis present

## 2016-12-28 DIAGNOSIS — I5032 Chronic diastolic (congestive) heart failure: Secondary | ICD-10-CM

## 2016-12-28 DIAGNOSIS — R0600 Dyspnea, unspecified: Secondary | ICD-10-CM | POA: Diagnosis not present

## 2016-12-28 DIAGNOSIS — J9621 Acute and chronic respiratory failure with hypoxia: Secondary | ICD-10-CM | POA: Diagnosis present

## 2016-12-28 DIAGNOSIS — M109 Gout, unspecified: Secondary | ICD-10-CM | POA: Diagnosis present

## 2016-12-28 DIAGNOSIS — T380X5A Adverse effect of glucocorticoids and synthetic analogues, initial encounter: Secondary | ICD-10-CM | POA: Diagnosis present

## 2016-12-28 DIAGNOSIS — A419 Sepsis, unspecified organism: Principal | ICD-10-CM | POA: Diagnosis present

## 2016-12-28 DIAGNOSIS — I5033 Acute on chronic diastolic (congestive) heart failure: Secondary | ICD-10-CM | POA: Diagnosis present

## 2016-12-28 DIAGNOSIS — I482 Chronic atrial fibrillation: Secondary | ICD-10-CM | POA: Diagnosis present

## 2016-12-28 DIAGNOSIS — R609 Edema, unspecified: Secondary | ICD-10-CM

## 2016-12-28 DIAGNOSIS — E784 Other hyperlipidemia: Secondary | ICD-10-CM | POA: Diagnosis present

## 2016-12-28 DIAGNOSIS — I10 Essential (primary) hypertension: Secondary | ICD-10-CM | POA: Diagnosis present

## 2016-12-28 DIAGNOSIS — E662 Morbid (severe) obesity with alveolar hypoventilation: Secondary | ICD-10-CM | POA: Diagnosis present

## 2016-12-28 DIAGNOSIS — R52 Pain, unspecified: Secondary | ICD-10-CM

## 2016-12-28 DIAGNOSIS — Z8249 Family history of ischemic heart disease and other diseases of the circulatory system: Secondary | ICD-10-CM

## 2016-12-28 DIAGNOSIS — I878 Other specified disorders of veins: Secondary | ICD-10-CM | POA: Diagnosis present

## 2016-12-28 DIAGNOSIS — D6959 Other secondary thrombocytopenia: Secondary | ICD-10-CM | POA: Diagnosis present

## 2016-12-28 DIAGNOSIS — I6789 Other cerebrovascular disease: Secondary | ICD-10-CM | POA: Diagnosis present

## 2016-12-28 DIAGNOSIS — I4891 Unspecified atrial fibrillation: Secondary | ICD-10-CM | POA: Diagnosis present

## 2016-12-28 DIAGNOSIS — E1165 Type 2 diabetes mellitus with hyperglycemia: Secondary | ICD-10-CM | POA: Diagnosis present

## 2016-12-28 DIAGNOSIS — Z87891 Personal history of nicotine dependence: Secondary | ICD-10-CM

## 2016-12-28 DIAGNOSIS — E440 Moderate protein-calorie malnutrition: Secondary | ICD-10-CM | POA: Diagnosis present

## 2016-12-28 DIAGNOSIS — Z9981 Dependence on supplemental oxygen: Secondary | ICD-10-CM

## 2016-12-28 DIAGNOSIS — N183 Chronic kidney disease, stage 3 (moderate): Secondary | ICD-10-CM | POA: Diagnosis present

## 2016-12-28 DIAGNOSIS — D72820 Lymphocytosis (symptomatic): Secondary | ICD-10-CM | POA: Diagnosis not present

## 2016-12-28 DIAGNOSIS — M609 Myositis, unspecified: Secondary | ICD-10-CM | POA: Diagnosis present

## 2016-12-28 LAB — BLOOD GAS, ARTERIAL
ACID-BASE EXCESS: 11.4 mmol/L — AB (ref 0.0–2.0)
Bicarbonate: 35.4 mmol/L — ABNORMAL HIGH (ref 20.0–28.0)
DRAWN BY: 41977
Delivery systems: POSITIVE
EXPIRATORY PAP: 5
FIO2: 40
Inspiratory PAP: 15
LHR: 10 {breaths}/min
MODE: POSITIVE
O2 SAT: 98.6 %
PATIENT TEMPERATURE: 98.6
PCO2 ART: 46.4 mmHg (ref 32.0–48.0)
PH ART: 7.495 — AB (ref 7.350–7.450)
pO2, Arterial: 116 mmHg — ABNORMAL HIGH (ref 83.0–108.0)

## 2016-12-28 LAB — URINALYSIS, ROUTINE W REFLEX MICROSCOPIC
Bilirubin Urine: NEGATIVE
GLUCOSE, UA: NEGATIVE mg/dL
KETONES UR: NEGATIVE mg/dL
NITRITE: POSITIVE — AB
PH: 6 (ref 5.0–8.0)
PROTEIN: 100 mg/dL — AB
Specific Gravity, Urine: 1.008 (ref 1.005–1.030)
Squamous Epithelial / LPF: NONE SEEN

## 2016-12-28 LAB — CBC WITH DIFFERENTIAL/PLATELET
Band Neutrophils: 0 %
Basophils Absolute: 0.2 10*3/uL — ABNORMAL HIGH (ref 0.0–0.1)
Basophils Relative: 1 %
Blasts: 0 %
EOS PCT: 2 %
Eosinophils Absolute: 0.5 10*3/uL (ref 0.0–0.7)
HEMATOCRIT: 42.3 % (ref 39.0–52.0)
Hemoglobin: 13.8 g/dL (ref 13.0–17.0)
LYMPHS PCT: 41 %
Lymphs Abs: 10.1 10*3/uL — ABNORMAL HIGH (ref 0.7–4.0)
MCH: 30.6 pg (ref 26.0–34.0)
MCHC: 32.6 g/dL (ref 30.0–36.0)
MCV: 93.8 fL (ref 78.0–100.0)
MYELOCYTES: 0 %
Metamyelocytes Relative: 0 %
Monocytes Absolute: 2 10*3/uL — ABNORMAL HIGH (ref 0.1–1.0)
Monocytes Relative: 8 %
NEUTROS PCT: 48 %
NRBC: 0 /100{WBCs}
Neutro Abs: 11.8 10*3/uL — ABNORMAL HIGH (ref 1.7–7.7)
OTHER: 0 %
PROMYELOCYTES ABS: 0 %
Platelets: 148 10*3/uL — ABNORMAL LOW (ref 150–400)
RBC: 4.51 MIL/uL (ref 4.22–5.81)
RDW: 18.4 % — ABNORMAL HIGH (ref 11.5–15.5)
WBC: 24.6 10*3/uL — AB (ref 4.0–10.5)

## 2016-12-28 LAB — COMPREHENSIVE METABOLIC PANEL
ALT: 16 U/L — AB (ref 17–63)
ANION GAP: 13 (ref 5–15)
AST: 30 U/L (ref 15–41)
Albumin: 2.7 g/dL — ABNORMAL LOW (ref 3.5–5.0)
Alkaline Phosphatase: 88 U/L (ref 38–126)
BUN: 32 mg/dL — ABNORMAL HIGH (ref 6–20)
CALCIUM: 9.2 mg/dL (ref 8.9–10.3)
CHLORIDE: 91 mmol/L — AB (ref 101–111)
CO2: 35 mmol/L — ABNORMAL HIGH (ref 22–32)
CREATININE: 1.77 mg/dL — AB (ref 0.61–1.24)
GFR, EST AFRICAN AMERICAN: 40 mL/min — AB (ref >60)
GFR, EST NON AFRICAN AMERICAN: 34 mL/min — AB (ref >60)
Glucose, Bld: 234 mg/dL — ABNORMAL HIGH (ref 65–99)
Potassium: 4.7 mmol/L (ref 3.5–5.1)
SODIUM: 139 mmol/L (ref 135–145)
Total Bilirubin: 0.7 mg/dL (ref 0.3–1.2)
Total Protein: 7.1 g/dL (ref 6.5–8.1)

## 2016-12-28 LAB — I-STAT ARTERIAL BLOOD GAS, ED
Acid-Base Excess: 16 mmol/L — ABNORMAL HIGH (ref 0.0–2.0)
BICARBONATE: 41.3 mmol/L — AB (ref 20.0–28.0)
O2 Saturation: 98 %
PCO2 ART: 49.7 mmHg — AB (ref 32.0–48.0)
PH ART: 7.528 — AB (ref 7.350–7.450)
PO2 ART: 103 mmHg (ref 83.0–108.0)
TCO2: 43 mmol/L (ref 0–100)

## 2016-12-28 LAB — I-STAT TROPONIN, ED: Troponin i, poc: 0 ng/mL (ref 0.00–0.08)

## 2016-12-28 LAB — BRAIN NATRIURETIC PEPTIDE: B Natriuretic Peptide: 141.7 pg/mL — ABNORMAL HIGH (ref 0.0–100.0)

## 2016-12-28 LAB — I-STAT CG4 LACTIC ACID, ED: Lactic Acid, Venous: 3.88 mmol/L (ref 0.5–1.9)

## 2016-12-28 MED ORDER — WARFARIN SODIUM 3 MG PO TABS: ORAL_TABLET | ORAL | Status: DC

## 2016-12-28 MED ORDER — LORAZEPAM 2 MG/ML IJ SOLN
0.5000 mg | Freq: Once | INTRAMUSCULAR | Status: AC
  Administered 2016-12-28: 0.5 mg via INTRAVENOUS
  Filled 2016-12-28: qty 1

## 2016-12-28 MED ORDER — NITROGLYCERIN 0.4 MG SL SUBL
SUBLINGUAL_TABLET | SUBLINGUAL | Status: AC
  Filled 2016-12-28: qty 1

## 2016-12-28 MED ORDER — SODIUM CHLORIDE 0.9 % IV SOLN
2000.0000 mg | Freq: Once | INTRAVENOUS | Status: AC
  Administered 2016-12-28: 2000 mg via INTRAVENOUS
  Filled 2016-12-28: qty 2000

## 2016-12-28 MED ORDER — LORAZEPAM 2 MG/ML IJ SOLN
0.5000 mg | Freq: Once | INTRAMUSCULAR | Status: AC
  Administered 2016-12-28 – 2016-12-29 (×2): 0.5 mg via INTRAVENOUS

## 2016-12-28 MED ORDER — VANCOMYCIN HCL 10 G IV SOLR
1250.0000 mg | INTRAVENOUS | Status: DC
  Administered 2016-12-29: 1250 mg via INTRAVENOUS
  Filled 2016-12-28: qty 1250

## 2016-12-28 MED ORDER — VANCOMYCIN HCL IN DEXTROSE 1-5 GM/200ML-% IV SOLN
1000.0000 mg | Freq: Once | INTRAVENOUS | Status: DC
Start: 2016-12-28 — End: 2016-12-29
  Administered 2016-12-29: 1000 mg via INTRAVENOUS

## 2016-12-28 MED ORDER — LORAZEPAM 2 MG/ML IJ SOLN
INTRAMUSCULAR | Status: AC
  Administered 2016-12-29: 0.5 mg via INTRAVENOUS
  Filled 2016-12-28: qty 1

## 2016-12-28 MED ORDER — PIPERACILLIN-TAZOBACTAM 3.375 G IVPB
3.3750 g | Freq: Three times a day (TID) | INTRAVENOUS | Status: DC
  Administered 2016-12-29 – 2016-12-30 (×4): 3.375 g via INTRAVENOUS
  Filled 2016-12-28 (×6): qty 50

## 2016-12-28 MED ORDER — PIPERACILLIN-TAZOBACTAM 3.375 G IVPB 30 MIN
3.3750 g | Freq: Once | INTRAVENOUS | Status: AC
  Administered 2016-12-28: 3.375 g via INTRAVENOUS
  Filled 2016-12-28: qty 50

## 2016-12-28 NOTE — ED Provider Notes (Signed)
[facility] DEPT Provider Note   CSN: [id number] Arrival date & time: 12/28/16  1952     History   Chief Complaint Chief Complaint  Patient presents with  . Shortness of Breath    HPI William Braun is a 83 y.o. male.  The history is provided by the patient, the EMS personnel and medical records.  Shortness of Breath  This is a recurrent problem. The average episode lasts 4 hours. The problem occurs intermittently.The current episode started 3 to 5 hours ago. Progression since onset: Worsened. Associated symptoms include cough (chronic). Pertinent negatives include no fever, no rhinorrhea, no sore throat, no ear pain, no neck pain, no chest pain, no syncope, no vomiting, no abdominal pain, no rash and no leg swelling. Precipitated by: No obvious trigger. He has tried beta-agonist inhalers for the symptoms. The treatment provided no relief. He has had prior hospitalizations. He has had prior ED visits. Associated medical issues include COPD. Associated medical issues comments: CHF, CKD, recurrent UTI, DM.    Past Medical History:  Diagnosis Date  . Atrial fibrillation ([place])   . Cerebrovascular disease   . CHF (congestive heart failure) ([place])   . CLL (chronic lymphocytic leukemia) ([place]) 11/17/2014  . COPD (chronic obstructive pulmonary disease) ([place])   . History of thrombocytopenia   . Obesity   . Osteoarthritis   . Other and unspecified hyperlipidemia   . Type II or unspecified type diabetes mellitus without mention of complication, not stated as uncontrolled   . Unspecified essential hypertension     Patient Active Problem List   Diagnosis Date Noted  . Sepsis ([place]) 12/29/2016  . Acute respiratory failure ([place]) 12/29/2016  . Pain and swelling of right wrist 12/28/2016  . Chronic kidney disease (CKD), stage III (moderate) 11/17/2016  . Diabetic neuropathy ([place]) 11/17/2016  . Diabetic kidney ([place]) 11/17/2016  . Diabetes mellitus with polyneuropathy ([place]) 11/17/2016  .  Acute on chronic diastolic CHF (congestive heart failure) ([place]) 10/31/2016  . Pressure injury of skin 10/30/2016  . Complicated UTI (urinary tract infection) 10/29/2016  . Acute on chronic respiratory failure with hypoxia ([place]) 09/14/2016  . Elevated troponin 09/14/2016  . Hypokalemia 09/14/2016  . Acute renal failure superimposed on stage 3 chronic kidney disease ([place]) 09/14/2016  . Toe ulcer, right ([place]) 02/02/2016  . Type II diabetes mellitus with neurological manifestations ([place]) 02/02/2016  . CLL (chronic lymphocytic leukemia) ([place]) 11/17/2014  . Elevated WBCs 04/23/2014  . Other emphysema ([place]) 09/27/2013  . Dyspnea 08/13/2013  . 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 Chest pain, unspecified 08/03/2011  . Chronic diastolic CHF (congestive heart failure) ([place]) 05/11/2011  . Obesity hypoventilation syndrome ([place]) 03/12/2010  . CONGESTIVE HEART FAILURE, BIVENTRICULAR DYSFUNCTION 03/12/2010  . COPD exacerbation ([place]) 03/12/2010  . Chronic respiratory failure ([place]) 02/19/2010  . HLD (hyperlipidemia) 12/03/2008  . Morbid obesity ([place]) 12/03/2008  . Essential hypertension 12/03/2008  . ATRIAL FIBRILLATION 12/03/2008  . PERIPHERAL EDEMA 12/03/2008    Past Surgical History:  Procedure Laterality Date  . KNEE ARTHROSCOPY W/ ALLOGRAFT IMPANT    . VASECTOMY         Home Medications    Prior to Admission medications   Medication Sig Start Date End Date Taking? Authorizing Provider  albuterol (VENTOLIN HFA) 108 (90 BASE) MCG/ACT inhaler Inhale 2 puffs into the lungs every 6 (six) hours as needed for wheezing or shortness of breath. 07/23/15  Yes Chesley Mires, MD  alum & mag hydroxide-simeth (MAALOX/MYLANTA) 200-200-20 MG/5ML suspension Take 30 mLs by  mouth as needed for indigestion or heartburn. Patient taking differently: Take 30 mLs by mouth every 4 (four) hours as needed for indigestion or heartburn.  11/07/16  Yes Lavina Hamman, MD  antiseptic oral rinse (BIOTENE) LIQD 15 mLs by Mouth  Rinse route every 6 (six) hours as needed for dry mouth.   Yes Historical Provider, MD  atorvastatin (LIPITOR) 10 MG tablet Take 1 tablet (10 mg total) by mouth daily at 6 PM. 11/07/16  Yes Lavina Hamman, MD  bisacodyl (DULCOLAX) 10 MG suppository Place 10 mg rectally daily as needed for moderate constipation.   Yes Historical Provider, MD  budesonide-formoterol (SYMBICORT) 160-4.5 MCG/ACT inhaler Inhale 2 puffs into the lungs 2 (two) times daily. 2 puffs twice daily 11/13/15  Yes Chesley Mires, MD  divalproex (DEPAKOTE) 250 MG DR tablet Take 250 mg by mouth 2 (two) times daily.   Yes Historical Provider, MD  glimepiride (AMARYL) 4 MG tablet Take 8 mg by mouth daily with breakfast.   Yes Historical Provider, MD  HYDROcodone-acetaminophen (NORCO/VICODIN) 5-325 MG tablet Take 1 tablet by mouth every 6 (six) hours as needed for moderate pain or severe pain. 11/15/16  Yes Lauree Chandler, NP  Insulin Glargine (LANTUS) 100 UNIT/ML Solostar Pen Inject 18 Units into the skin daily at 10 pm. 11/07/16  Yes Lavina Hamman, MD  ipratropium-albuterol (DUONEB) 0.5-2.5 (3) MG/3ML SOLN Take 3 mLs by nebulization every 6 (six) hours as needed. Patient taking differently: Take 3 mLs by nebulization every 6 (six) hours as needed (COPD).  03/31/16  Yes Magdalen Spatz, NP  Liniments [facility] EX) Apply 1 patch topically every 12 (twelve) hours. With 4% lidocaine   Yes Historical Provider, MD  magnesium hydroxide (MILK OF MAGNESIA) 400 MG/5ML suspension Take 30 mLs by mouth daily as needed for mild constipation.   Yes Historical Provider, MD  Melatonin 3 MG TABS Take 6 mg by mouth at bedtime as needed (sleep).    Yes Historical Provider, MD  metoprolol succinate (TOPROL-XL) 25 MG 24 hr tablet Take 2 tablets (50 mg total) by mouth daily. 11/07/16  Yes Lavina Hamman, MD  mupirocin ointment (BACTROBAN) 2 % Apply 1 application topically 2 (two) times daily. 12/21/16  Yes Trula Slade, DPM  nitroGLYCERIN (NITROSTAT) 0.4 MG SL  tablet Place 1 tablet (0.4 mg total) under the tongue every 5 (five) minutes as needed for chest pain. 08/03/11  Yes Liliane Shi, PA-C  Nutritional Supplements (NUTRITIONAL SUPPLEMENT PO) Take 120 mLs by mouth 3 (three) times daily. Medpass   Yes Historical Provider, MD  OXYGEN Inhale 3 L into the lungs continuous.    Yes Historical Provider, MD  polyethylene glycol (MIRALAX / GLYCOLAX) packet Take 17 g by mouth daily. 11/07/16  Yes Lavina Hamman, MD  potassium chloride SA (K-DUR,KLOR-CON) 20 MEQ tablet Take 2 tablets (40 mEq total) by mouth 2 (two) times daily. 12/26/16  Yes Arbutus Leas, NP  senna-docusate (SENOKOT-S) 8.6-50 MG tablet Take 1 tablet by mouth at bedtime as needed for mild constipation. 11/07/16  Yes Lavina Hamman, MD  Sodium Phosphates (RA SALINE ENEMA RE) Place 1 Applicatorful rectally daily as needed (constipation).   Yes Historical Provider, MD  tiotropium (SPIRIVA) 18 MCG inhalation capsule Place 1 capsule (18 mcg total) into inhaler and inhale daily. 11/13/15  Yes Chesley Mires, MD  torsemide (DEMADEX) 20 MG tablet Take 3 tablets (60 mg total) by mouth 2 (two) times daily. 12/26/16  Yes Arbutus Leas, NP  warfarin (COUMADIN) 3 MG tablet Coumadin 3 mg daily on Monday, Wednesday, Thursday, Friday and Coumadin 4.5 mg daily on Tuesday and Saturdays Patient taking differently: Take 3 mg by mouth daily.  12/28/16  Yes Lauree Chandler, NP  metolazone (ZAROXOLYN) 2.5 MG tablet Take 1 tablet (2.5 mg total) by mouth daily. 12/26/16 03/26/17  Arbutus Leas, NP    Family History Family History  Problem Relation Age of Onset  . Heart disease Father     Social History Social History  Substance Use Topics  . Smoking status: Former Smoker    Packs/day: 2.00    Years: 34.00    Types: Cigarettes    Start date: 04/23/1946    Quit date: 09/19/1978  . Smokeless tobacco: Never Used     Comment: quit 35 years ago  . Alcohol use No     Allergies   Quinine   Review of Systems Review of  Systems  Constitutional: Negative for chills and fever.  HENT: Negative for ear pain, rhinorrhea and sore throat.   Eyes: Negative for pain and visual disturbance.  Respiratory: Positive for cough (chronic) and shortness of breath.   Cardiovascular: Negative for chest pain, palpitations, leg swelling and syncope.  Gastrointestinal: Negative for abdominal pain and vomiting.  Genitourinary: Negative for dysuria and hematuria.  Musculoskeletal: Positive for arthralgias and joint swelling. Negative for back pain and neck pain.  Skin: Negative for color change and rash.  Neurological: Negative for seizures and syncope.  All other systems reviewed and are negative.    Physical Exam Updated Vital Signs BP 101/60 (BP Location: Right Arm)   Pulse 89   Temp 98 F (36.7 C) (Oral)   Resp (!) 24   Wt 104.9 kg Comment: bed; pt unable to stand  SpO2 98%   BMI 29.68 kg/m   Physical Exam  Constitutional: He appears well-developed and well-nourished.  HENT:  Head: Normocephalic and atraumatic.  Eyes: Conjunctivae are normal.  Neck: Neck supple.  Cardiovascular: Normal rate and regular rhythm.   No murmur heard. Pulmonary/Chest: He is in respiratory distress.  Tachypnea, diffuse fine crackles in all lung fields, moving air; on BiPAP  Abdominal: Soft. There is no tenderness.  Musculoskeletal: He exhibits no edema.  Swollen, warm, erythematous right wrist that is TTP  Neurological: He is alert.  Answers questions appropriately, moving all 4ext  Skin: Skin is warm and dry.  Chronic venous stasis changes to b/l LE  Psychiatric: He has a normal mood and affect.  Nursing note and vitals reviewed.    ED Treatments / Results  Labs (all labs ordered are listed, but only abnormal results are displayed) Labs Reviewed  COMPREHENSIVE METABOLIC PANEL - Abnormal; Notable for the following:       Result Value   Chloride 91 (*)    CO2 35 (*)    Glucose, Bld 234 (*)    BUN 32 (*)    Creatinine,  Ser 1.77 (*)    Albumin 2.7 (*)    ALT 16 (*)    GFR calc non Af Amer 34 (*)    GFR calc Af Amer 40 (*)    All other components within normal limits  CBC WITH DIFFERENTIAL/PLATELET - Abnormal; Notable for the following:    WBC 24.6 (*)    RDW 18.4 (*)    Platelets 148 (*)    Neutro Abs 11.8 (*)    Lymphs Abs 10.1 (*)    Monocytes Absolute 2.0 (*)    Basophils  Absolute 0.2 (*)    All other components within normal limits  URINALYSIS, ROUTINE W REFLEX MICROSCOPIC - Abnormal; Notable for the following:    Color, Urine AMBER (*)    APPearance TURBID (*)    Hgb urine dipstick MODERATE (*)    Protein, ur 100 (*)    Nitrite POSITIVE (*)    Leukocytes, UA LARGE (*)    Bacteria, UA MANY (*)    All other components within normal limits  BRAIN NATRIURETIC PEPTIDE - Abnormal; Notable for the following:    B Natriuretic Peptide 141.7 (*)    All other components within normal limits  BLOOD GAS, ARTERIAL - Abnormal; Notable for the following:    pH, Arterial 7.495 (*)    pO2, Arterial 116 (*)    Bicarbonate 35.4 (*)    Acid-Base Excess 11.4 (*)    All other components within normal limits  I-STAT ARTERIAL BLOOD GAS, ED - Abnormal; Notable for the following:    pH, Arterial 7.528 (*)    pCO2 arterial 49.7 (*)    Bicarbonate 41.3 (*)    Acid-Base Excess 16.0 (*)    All other components within normal limits  I-STAT CG4 LACTIC ACID, ED - Abnormal; Notable for the following:    Lactic Acid, Venous 3.88 (*)    All other components within normal limits  I-STAT CG4 LACTIC ACID, ED - Abnormal; Notable for the following:    Lactic Acid, Venous 2.30 (*)    All other components within normal limits  CULTURE, BLOOD (ROUTINE X 2)  CULTURE, BLOOD (ROUTINE X 2)  MRSA PCR SCREENING  INFLUENZA PANEL BY PCR (TYPE A & B)  TROPONIN I  TROPONIN I  TROPONIN I  PROTIME-INR  CBC WITH DIFFERENTIAL/PLATELET  COMPREHENSIVE METABOLIC PANEL  LACTIC ACID, PLASMA  LACTIC ACID, PLASMA  PROCALCITONIN  APTT    I-STAT TROPOININ, ED  I-STAT ARTERIAL BLOOD GAS, ED  I-STAT CG4 LACTIC ACID, ED    EKG  EKG Interpretation  Date/Time:  Wednesday December 28 2016 20:04:44 EDT Ventricular Rate:  103 PR Interval:    QRS Duration: 135 QT Interval:  358 QTC Calculation: 469 R Axis:   -37 Text Interpretation:  Atrial fibrillation Right bundle branch block No STEMI.  Confirmed by LONG MD, JOSHUA [phone]) on 12/28/2016 11:42:55 PM       Radiology Dg Wrist Complete Right  Result Date: 12/29/2016 CLINICAL DATA:  Acute onset of right wrist pain and swelling. Initial encounter. EXAM: RIGHT WRIST - COMPLETE 3+ VIEW COMPARISON:  None. FINDINGS: There is no evidence of fracture or dislocation. Mild degenerative change is noted about the distal first metacarpal, with osteophyte formation and subcortical cyst formation. Mild degenerative change is noted about the first interphalangeal joint. The carpal rows are intact, and demonstrate normal alignment. The joint spaces are preserved. Diffuse vascular calcifications are seen. Diffuse soft tissue swelling is noted about the wrist. IMPRESSION: 1. No evidence of fracture or dislocation. 2. Mild degenerative change about the thumb. 3. Diffuse vascular calcifications seen. Electronically Signed   By: Garald Balding M.D.   On: 12/29/2016 00:25   Dg Chest Portable 1 View  Result Date: 12/28/2016 CLINICAL DATA:  Dyspnea, history of atrial fibrillation, COPD and CHF with hypertension and diabetes. EXAM: PORTABLE CHEST 1 VIEW COMPARISON:  12/26/2016 FINDINGS: The heart is top-normal in size. Patient is slightly rotated to the right on current exam. No aortic aneurysm is identified. Chronic elevation of the right hemidiaphragm versus eventration is noted with adjacent  atelectasis. Suggestion of mild interstitial edema given increased interstitial lung markings. No effusion or pneumothorax. No acute nor suspicious osseous abnormalities. Chronic degenerative changes are noted along the  [facility] and glenohumeral joints. IMPRESSION: Mild interstitial edema suggested. No pneumonic consolidation. Stable borderline cardiomegaly. Electronically Signed   By: Ashley Royalty M.D.   On: 12/28/2016 20:22    Procedures Procedures (including critical care time)  Medications Ordered in ED Medications  vancomycin (VANCOCIN) 1,250 mg in sodium chloride 0.9 % 250 mL IVPB (not administered)  piperacillin-tazobactam (ZOSYN) IVPB 3.375 g (not administered)  antiseptic oral rinse (BIOTENE) solution 15 mL (not administered)  bisacodyl (DULCOLAX) suppository 10 mg (not administered)  divalproex (DEPAKOTE) DR tablet 250 mg (not administered)  Melatonin TABS 6 mg (not administered)  HYDROcodone-acetaminophen (NORCO/VICODIN) 5-325 MG per tablet 1 tablet (not administered)  atorvastatin (LIPITOR) tablet 10 mg (not administered)  glimepiride (AMARYL) tablet 8 mg (not administered)  insulin glargine (LANTUS) injection 18 Units (not administered)  metoprolol succinate (TOPROL-XL) 24 hr tablet 50 mg (not administered)  polyethylene glycol (MIRALAX / GLYCOLAX) packet 17 g (not administered)  senna-docusate (Senokot-S) tablet 1 tablet (not administered)  ipratropium-albuterol (DUONEB) 0.5-2.5 (3) MG/3ML nebulizer solution 3 mL (not administered)  mometasone-formoterol (DULERA) 200-5 MCG/ACT inhaler 2 puff (not administered)  nitroGLYCERIN (NITROSTAT) SL tablet 0.4 mg (not administered)  acetaminophen (TYLENOL) tablet 650 mg (not administered)    Or  acetaminophen (TYLENOL) suppository 650 mg (not administered)  ondansetron (ZOFRAN) tablet 4 mg (not administered)    Or  ondansetron (ZOFRAN) injection 4 mg (not administered)  insulin aspart (novoLOG) injection 0-9 Units (not administered)  feeding supplement (ENSURE ENLIVE) (ENSURE ENLIVE) liquid 237 mL (not administered)  chlorhexidine (PERIDEX) 0.12 % solution 15 mL (not administered)  MEDLINE mouth rinse (not administered)  LORazepam (ATIVAN) injection  0.5 mg (0.5 mg Intravenous Given 12/29/16 0215)  piperacillin-tazobactam (ZOSYN) IVPB 3.375 g (0 g Intravenous Stopped 12/28/16 2243)  vancomycin (VANCOCIN) 2,000 mg in sodium chloride 0.9 % 500 mL IVPB (0 mg Intravenous Stopped 12/28/16 2345)  LORazepam (ATIVAN) injection 0.5 mg (0.5 mg Intravenous Given 12/28/16 2213)  morphine 4 MG/ML injection 1 mg (1 mg Intravenous Given 12/29/16 0137)     Initial Impression / Assessment and Plan / ED Course  I have reviewed the triage vital signs and the nursing notes.  Pertinent labs & imaging results that were available during my care of the patient were reviewed by me and considered in my medical decision making (see chart for details).  Clinical Course as of Dec 29 253  Wed Dec 28, 2016  2125 Inspiratory PAP: 15 [JL]    Clinical Course User Index [JL] Margette Fast, MD   Pt with CLL, CHF, COPD (on 3L daily), DM, recurrent UTI presents with SOB. Per EMS, the staff at [facility] said he complained breathing problems at 1630 & was given a nebulizer treatment that didn't help much; he apparently complained to the staff again at [facility] was hypoxic (unknown saturation), so the staff called 911. EMS placed him on BiPAP and transported him here. The Pt complains of difficulty breathing and right wrist pain that has been present for the last 48hrs.  VS & exam as above. EKG: Afib @ 103bpm w/RBBB and no signs of ischemia; similar in appearance to tracing from 2/18. CXR w/mild interstitial edema. Labs remarkable for WBC 24.6 (appears near baseline), Crt 1.77, BNP 141.7, & undetectable troponin. UA consistent with infection. XR of the right wrist w/o acute abnormalities.  Vancomycin &  Zosyn started in the ED.  Pt transitioned from BiPAP to 3L [place] & maintained SaO2s in the mid 90s.  Will admit the Pt to the Hospitalist's service for further evaluation and treatment.   Final Clinical Impressions(s) / ED Diagnoses   Final diagnoses:  Dyspnea, unspecified type    Acute cystitis with hematuria    New Prescriptions Current Discharge Medication List       Jenny Reichmann, MD 12/29/16 [place], MD 12/29/16 1009

## 2016-12-28 NOTE — ED Triage Notes (Signed)
Patient from [facility], increased shortness of breath during the day today, was given a neb at SNF around 1600 with no relief.  Patient continues with shortness of breath, was placed on CPAP en route to ED.  No chest pain per EMS.

## 2016-12-28 NOTE — Progress Notes (Signed)
Nursing Home Location:    [facility]  Place of Service: SN[facility] (31)  PCP: Leeroy Cha, MD  Allergies  Allergen Reactions  . Quinine Palpitations and Other (See Comments)    Seizure     Chief Complaint  Patient presents with  . Acute Visit    HPI:  Patient is a 83 y.o. male seen today at  [facility] as an acute visit for right wrist pain, and cough/congestion. Pt with hx of CHF, COPD, OSA. Pt was seen 4/9 in the heart failure clinic for follow up care. He was given a one time dose of zaroxolyn and Demadex increased for fluid retention based on his physical  exam. CXR showed subsegmental atelectasis with a recommendation to begin antibiotic therpay with follow up xray in 3-4 weeks, however, clinically, he does not appear to have PNA. Today pt denies worsening SOB, cough, or sputum production. Denies fevers, chills,myalgias, or pleuritic pain.     Additionally he was see regarding his right wrist pain that began approximately 3 days ago. He admits to severe pain which worsens with movement. He has been elevating the wrist with pillows and trying no to use the arm. He has received pain medication for this with no relief. Denies trauma or fall, fevers, chills, hx of gout. He does admit to history of arthritis.   Review of Systems:  Review of Systems  Constitutional: Negative for activity change, appetite change, chills, diaphoresis, fatigue, fever and unexpected weight change.  HENT: Negative for congestion, postnasal drip, sinus pain, sinus pressure and sore throat.   Eyes: Negative.   Respiratory: Positive for cough and shortness of breath. Negative for chest tightness and wheezing.        Shortness of breath, cough and congestion at baseline.   Cardiovascular: Positive for leg swelling. Negative for chest pain and palpitations.       Left greater than right lower extremity edema  Gastrointestinal: Negative for abdominal pain, constipation, diarrhea, nausea and vomiting.    Musculoskeletal: Positive for joint swelling. Negative for arthralgias and myalgias.  Neurological: Negative for syncope, light-headedness and headaches.  Psychiatric/Behavioral: Negative for agitation and behavioral problems. Confusion: at times. The patient is not nervous/anxious.     Past Medical History:  Diagnosis Date  . Atrial fibrillation ([place])   . Cerebrovascular disease   . CHF (congestive heart failure) ([place])   . CLL (chronic lymphocytic leukemia) ([place]) 11/17/2014  . COPD (chronic obstructive pulmonary disease) ([place])   . History of thrombocytopenia   . Obesity   . Osteoarthritis   . Other and unspecified hyperlipidemia   . Type II or unspecified type diabetes mellitus without mention of complication, not stated as uncontrolled   . Unspecified essential hypertension    Past Surgical History:  Procedure Laterality Date  . KNEE ARTHROSCOPY W/ ALLOGRAFT IMPANT    . VASECTOMY     Social History:   reports that he quit smoking about 38 years ago. His smoking use included Cigarettes. He started smoking about 70 years ago. He has a 68.00 pack-year smoking history. He has never used smokeless tobacco. He reports that he does not drink alcohol. His drug history is not on file.  Family History  Problem Relation Age of Onset  . Heart disease Father     Medications: Patient's Medications  New Prescriptions   WARFARIN (COUMADIN) 3 MG TABLET    Coumadin 3 mg daily on Monday, Wednesday, Thursday, Friday and Coumadin 4.5 mg daily on Tuesday and Saturdays  Previous Medications   ALBUTEROL (VENTOLIN HFA) 108 (90 BASE) MCG/ACT INHALER    Inhale 2 puffs into the lungs every 6 (six) hours as needed for wheezing or shortness of breath.   ALUM & MAG HYDROXIDE-SIMETH (MAALOX/MYLANTA) 200-200-20 MG/5ML SUSPENSION    Take 30 mLs by mouth as needed for indigestion or heartburn.   ATORVASTATIN (LIPITOR) 10 MG TABLET    Take 1 tablet (10 mg total) by mouth daily at 6 PM.   BUDESONIDE-FORMOTEROL  (SYMBICORT) 160-4.5 MCG/ACT INHALER    Inhale 2 puffs into the lungs 2 (two) times daily. 2 puffs twice daily   DIVALPROEX (DEPAKOTE) 250 MG DR TABLET    Take 250 mg by mouth 2 (two) times daily.   GLIMEPIRIDE (AMARYL) 4 MG TABLET    Take 8 mg by mouth daily with breakfast.   GUAIFENESIN (MUCINEX MAXIMUM STRENGTH) 1200 MG TB12    Take 1,200 mg by mouth 2 (two) times daily as needed (cough).   HYDROCODONE-ACETAMINOPHEN (NORCO/VICODIN) 5-325 MG TABLET    Take 1 tablet by mouth every 6 (six) hours as needed for moderate pain or severe pain.   INSULIN GLARGINE (LANTUS) 100 UNIT/ML SOLOSTAR PEN    Inject 18 Units into the skin daily at 10 pm.   IPRATROPIUM-ALBUTEROL (DUONEB) 0.5-2.5 (3) MG/3ML SOLN    Take 3 mLs by nebulization every 6 (six) hours as needed.   LIDOCAINE (LIDODERM) 5 %    Place 1 patch onto the skin daily. Remove & Discard patch within 12 hours or as directed by MD   MELATONIN 3 MG TABS    Take two tablets by mouth once daily at bedtime as needed for sleep   METOLAZONE (ZAROXOLYN) 2.5 MG TABLET    Take 1 tablet (2.5 mg total) by mouth daily.   METOPROLOL SUCCINATE (TOPROL-XL) 25 MG 24 HR TABLET    Take 2 tablets (50 mg total) by mouth daily.   MUPIROCIN OINTMENT (BACTROBAN) 2 %    Apply 1 application topically 2 (two) times daily.   NITROGLYCERIN (NITROSTAT) 0.4 MG SL TABLET    Place 1 tablet (0.4 mg total) under the tongue every 5 (five) minutes as needed for chest pain.   NUTRITIONAL SUPPLEMENTS (NUTRITIONAL SUPPLEMENT PO)    Take 120 mLs by mouth 3 (three) times daily. Medpass   OXYGEN    Inhale 3 L into the lungs continuous.    POLYETHYLENE GLYCOL (MIRALAX / GLYCOLAX) PACKET    Take 17 g by mouth daily.   POTASSIUM CHLORIDE SA (K-DUR,KLOR-CON) 20 MEQ TABLET    Take 2 tablets (40 mEq total) by mouth 2 (two) times daily.   SENNA-DOCUSATE (SENOKOT-S) 8.6-50 MG TABLET    Take 1 tablet by mouth at bedtime as needed for mild constipation.   TIOTROPIUM (SPIRIVA) 18 MCG INHALATION CAPSULE     Place 1 capsule (18 mcg total) into inhaler and inhale daily.   TORSEMIDE (DEMADEX) 20 MG TABLET    Take 3 tablets (60 mg total) by mouth 2 (two) times daily.  Modified Medications   No medications on file  Discontinued Medications   WARFARIN (COUMADIN) 2 MG TABLET    Take 2 mg by mouth on Mon, Wed, Fri, and Sun. Take 3 mg (1 1/2 tab) on Tue, Thurs, and Sat.     Physical Exam: Vitals:   12/28/16 1011  BP: 119/89  Pulse: 96  Resp: 20  Temp: 98.3 F (36.8 C)  SpO2: 93%  Weight: 257 lb 6.4 oz (116.8 kg)  Physical Exam  Constitutional: He is oriented to person, place, and time. He appears well-developed and well-nourished. No distress.  Frail, ill appearing  HENT:  Head: Normocephalic and atraumatic.  Mouth/Throat: Oropharynx is clear and moist. No oropharyngeal exudate.  Eyes: Conjunctivae and EOM are normal. Pupils are equal, round, and reactive to light. Right eye exhibits no discharge. Left eye exhibits no discharge.  Neck: Normal range of motion. Neck supple.  Cardiovascular: Normal rate, regular rhythm, normal heart sounds and intact distal pulses.   Pulmonary/Chest: Effort normal. No accessory muscle usage. No respiratory distress. He has rales in the right lower field and the left lower field.  Diminished throughout   Abdominal: Soft. Bowel sounds are normal. He exhibits no distension. There is no tenderness. There is no guarding.  Musculoskeletal: He exhibits edema. He exhibits no tenderness.  Stasis hyperpigmentation to bilateral lower extremities; left greater than right 2+ pitting edema; lower extremity compression stockings in place  Neurological: He is alert and oriented to person, place, and time.  Skin: Skin is warm and dry. He is not diaphoretic.  Mild right wrist swelling with temperature change. Slight reddening to skin, tender to touch; does not radiate up or down the arm.   Psychiatric: He has a normal mood and affect.    Labs reviewed: Basic Metabolic  Panel:  Recent Labs  10/30/16 0238 10/31/16 0226  11/01/16 0224 11/02/16 0222  11/14/16 0353 11/15/16 11/17/16 1251 12/26/16 1449  NA 134* 141  < > 142 143  < > 140 146 145 139  K 3.8 3.3*  < > 3.1* 3.6  < > 4.3 3.3* 4.0 4.4  CL 83* 92*  < > 90* 90*  < > 97*  --  99 97*  CO2 33* 35*  < > 40* 43*  < > 33*  --  33 31  GLUCOSE 376* 157*  < > 151* 138*  < > 140*  --  135* 195*  BUN 105* 94*  < > 70* 49*  < > 35* 31* 33* 26*  CREATININE 2.21* 1.84*  < > 1.55* 1.38*  < > 1.50* 1.1 1.5* 1.52*  CALCIUM 8.5* 8.7*  < > 9.3 9.2  < > 8.6*  --  9.3 8.8*  MG 1.9 2.2  --  2.4 2.1  --   --   --   --   --   PHOS 4.6  --   --   --   --   --   --   --   --   --   < > = values in this interval not displayed. Liver Function Tests:  Recent Labs  10/31/16 0226 11/01/16 0224 11/17/16 1251  AST 26 22 34  ALT 17 16* 50*  ALKPHOS 60 57 98*  BILITOT 0.8 0.9 0.90  PROT 6.5 6.4* 6.7  ALBUMIN 2.7* 2.9* 3.1*   No results for input(s): LIPASE, AMYLASE in the last 8760 hours.  Recent Labs  10/30/16 0903  AMMONIA 19   CBC:  Recent Labs  11/01/16 0224  11/06/16 0236 11/14/16 0353 11/17/16 1251  WBC 25.9*  < > 27.9* 26.2* 27.1*  NEUTROABS 11.1*  --   --  8.9* 10.1*  HGB 13.1  < > 13.6 14.7 14.5  HCT 40.4  < > 41.1 43.7 43.4  MCV 93.1  < > 91.5 92.0 93  PLT 135*  < > 135* 143* 150  < > = values in this interval not displayed. TSH:  Recent Labs  10/30/16 0903  TSH 0.682   A1C: Lab Results  Component Value Date   HGBA1C 7.7 (H) 10/30/2016   Lipid Panel:  Recent Labs  09/15/16 0434  CHOL 80  HDL 23*  LDLCALC 40  TRIG 86  CHOLHDL 3.5   Lab Results  Component Value Date   INR 1.6 (A) 12/21/2016   INR 1.7 (A) 12/15/2016   INR 1.8 (A) 12/08/2016   PROTIME 18.6 (A) 12/21/2016   PROTIME 19.8 (A) 12/15/2016   PROTIME 21.0 (A) 12/08/2016     Assessment/Plan  1. Wrist pain, acute, right -Will obtain xray, CBC with diff, and uric acid level to evaluate and/o rule out  infection or acute gout flare -Pt on fluid restriction with CKD, however encouraged to drink plain clear liquids.  -Will do short course of prednisone due to pain and swelling in the meantime.  2. Chronic diastolic CHF (congestive heart failure) ([place]) -Based on clinical exam, will not treat with antibiotic and will leave Demedex at 60mg  PO BID for now. -Weight has recently stabilized, however has been trending up of the past 1-2 months. Ranges from [place]. Was as low as 248 on 3/2.  todays weight of 250.4 lbs.  -Continue to be seen by heart failure team and appreciate their input.    Carlos American. Harle Battiest  [facility] [phone] 8 am - 5 pm) [phone] (after hours)

## 2016-12-28 NOTE — Progress Notes (Signed)
Pharmacy Antibiotic Note  William Braun is a 83 y.o. male admitted on 12/28/2016 with sepsis.  Pharmacy has been consulted for vancomycin and zosyn dosing. Renal insufficiency noted with sCr 1.77 and CrCl 44 ml/min.  Vancomycin trough goal 15-20  Plan: 1) Vancomycin 2g IV x 1 then 1250mg  IV q24 2) Zosyn 3.375g IV q8 (4 hour infusion) 3) Follow renal function, cultures, LOT, level if needed     Temp (24hrs), Avg:99.4 F (37.4 C), Min:98.3 F (36.8 C), Max:100.4 F (38 C)   Recent Labs Lab 12/26/16 1449 12/28/16 1955 12/28/16 2027  WBC  --  24.6*  --   CREATININE 1.52* 1.77*  --   LATICACIDVEN  --   --  3.88*    Estimated Creatinine Clearance: 44.4 mL/min (A) (by C-G formula based on SCr of 1.77 mg/dL (H)).    Allergies  Allergen Reactions  . Quinine Palpitations and Other (See Comments)    Seizure     Antimicrobials this admission: 4/11 Vancomycin >> 4/11 Zosyn >>  Dose adjustments this admission: n/a  Microbiology results: None yet  Thank you for allowing pharmacy to be a part of this patient's care.  Deboraha Sprang 12/28/2016 9:07 PM

## 2016-12-29 ENCOUNTER — Emergency Department (HOSPITAL_COMMUNITY): Payer: PPO

## 2016-12-29 ENCOUNTER — Encounter (HOSPITAL_COMMUNITY): Payer: Self-pay | Admitting: Internal Medicine

## 2016-12-29 DIAGNOSIS — I10 Essential (primary) hypertension: Secondary | ICD-10-CM | POA: Diagnosis not present

## 2016-12-29 DIAGNOSIS — A419 Sepsis, unspecified organism: Secondary | ICD-10-CM | POA: Diagnosis not present

## 2016-12-29 DIAGNOSIS — E440 Moderate protein-calorie malnutrition: Secondary | ICD-10-CM | POA: Diagnosis present

## 2016-12-29 DIAGNOSIS — R0602 Shortness of breath: Secondary | ICD-10-CM | POA: Diagnosis not present

## 2016-12-29 DIAGNOSIS — L03113 Cellulitis of right upper limb: Secondary | ICD-10-CM | POA: Diagnosis not present

## 2016-12-29 DIAGNOSIS — N183 Chronic kidney disease, stage 3 (moderate): Secondary | ICD-10-CM | POA: Diagnosis not present

## 2016-12-29 DIAGNOSIS — J441 Chronic obstructive pulmonary disease with (acute) exacerbation: Secondary | ICD-10-CM | POA: Diagnosis not present

## 2016-12-29 DIAGNOSIS — C9110 Chronic lymphocytic leukemia of B-cell type not having achieved remission: Secondary | ICD-10-CM | POA: Diagnosis not present

## 2016-12-29 DIAGNOSIS — M109 Gout, unspecified: Secondary | ICD-10-CM | POA: Diagnosis not present

## 2016-12-29 DIAGNOSIS — N3001 Acute cystitis with hematuria: Secondary | ICD-10-CM | POA: Diagnosis not present

## 2016-12-29 DIAGNOSIS — D696 Thrombocytopenia, unspecified: Secondary | ICD-10-CM | POA: Diagnosis not present

## 2016-12-29 DIAGNOSIS — Z87440 Personal history of urinary (tract) infections: Secondary | ICD-10-CM | POA: Diagnosis not present

## 2016-12-29 DIAGNOSIS — M6281 Muscle weakness (generalized): Secondary | ICD-10-CM | POA: Diagnosis not present

## 2016-12-29 DIAGNOSIS — I482 Chronic atrial fibrillation: Secondary | ICD-10-CM | POA: Diagnosis not present

## 2016-12-29 DIAGNOSIS — Z7901 Long term (current) use of anticoagulants: Secondary | ICD-10-CM | POA: Diagnosis not present

## 2016-12-29 DIAGNOSIS — E1122 Type 2 diabetes mellitus with diabetic chronic kidney disease: Secondary | ICD-10-CM | POA: Diagnosis not present

## 2016-12-29 DIAGNOSIS — I5032 Chronic diastolic (congestive) heart failure: Secondary | ICD-10-CM | POA: Diagnosis not present

## 2016-12-29 DIAGNOSIS — C9190 Lymphoid leukemia, unspecified not having achieved remission: Secondary | ICD-10-CM | POA: Diagnosis not present

## 2016-12-29 DIAGNOSIS — T380X5A Adverse effect of glucocorticoids and synthetic analogues, initial encounter: Secondary | ICD-10-CM | POA: Diagnosis not present

## 2016-12-29 DIAGNOSIS — E1149 Type 2 diabetes mellitus with other diabetic neurological complication: Secondary | ICD-10-CM

## 2016-12-29 DIAGNOSIS — R0600 Dyspnea, unspecified: Secondary | ICD-10-CM | POA: Diagnosis not present

## 2016-12-29 DIAGNOSIS — I4891 Unspecified atrial fibrillation: Secondary | ICD-10-CM | POA: Diagnosis not present

## 2016-12-29 DIAGNOSIS — R609 Edema, unspecified: Secondary | ICD-10-CM | POA: Diagnosis not present

## 2016-12-29 DIAGNOSIS — E1165 Type 2 diabetes mellitus with hyperglycemia: Secondary | ICD-10-CM | POA: Diagnosis not present

## 2016-12-29 DIAGNOSIS — M6088 Other myositis, other site: Secondary | ICD-10-CM | POA: Diagnosis not present

## 2016-12-29 DIAGNOSIS — G4730 Sleep apnea, unspecified: Secondary | ICD-10-CM | POA: Diagnosis not present

## 2016-12-29 DIAGNOSIS — J9600 Acute respiratory failure, unspecified whether with hypoxia or hypercapnia: Secondary | ICD-10-CM | POA: Insufficient documentation

## 2016-12-29 DIAGNOSIS — R05 Cough: Secondary | ICD-10-CM | POA: Diagnosis not present

## 2016-12-29 DIAGNOSIS — R6520 Severe sepsis without septic shock: Secondary | ICD-10-CM | POA: Diagnosis not present

## 2016-12-29 DIAGNOSIS — J449 Chronic obstructive pulmonary disease, unspecified: Secondary | ICD-10-CM | POA: Diagnosis not present

## 2016-12-29 DIAGNOSIS — J9621 Acute and chronic respiratory failure with hypoxia: Secondary | ICD-10-CM | POA: Diagnosis not present

## 2016-12-29 DIAGNOSIS — J9601 Acute respiratory failure with hypoxia: Secondary | ICD-10-CM | POA: Diagnosis not present

## 2016-12-29 DIAGNOSIS — M609 Myositis, unspecified: Secondary | ICD-10-CM | POA: Diagnosis not present

## 2016-12-29 DIAGNOSIS — R600 Localized edema: Secondary | ICD-10-CM | POA: Diagnosis not present

## 2016-12-29 DIAGNOSIS — I6789 Other cerebrovascular disease: Secondary | ICD-10-CM | POA: Diagnosis not present

## 2016-12-29 DIAGNOSIS — Z66 Do not resuscitate: Secondary | ICD-10-CM | POA: Diagnosis not present

## 2016-12-29 DIAGNOSIS — R52 Pain, unspecified: Secondary | ICD-10-CM | POA: Diagnosis not present

## 2016-12-29 DIAGNOSIS — I5033 Acute on chronic diastolic (congestive) heart failure: Secondary | ICD-10-CM | POA: Diagnosis not present

## 2016-12-29 DIAGNOSIS — R488 Other symbolic dysfunctions: Secondary | ICD-10-CM | POA: Diagnosis not present

## 2016-12-29 DIAGNOSIS — N179 Acute kidney failure, unspecified: Secondary | ICD-10-CM | POA: Diagnosis not present

## 2016-12-29 DIAGNOSIS — E662 Morbid (severe) obesity with alveolar hypoventilation: Secondary | ICD-10-CM | POA: Diagnosis not present

## 2016-12-29 DIAGNOSIS — Z794 Long term (current) use of insulin: Secondary | ICD-10-CM | POA: Diagnosis not present

## 2016-12-29 DIAGNOSIS — I130 Hypertensive heart and chronic kidney disease with heart failure and stage 1 through stage 4 chronic kidney disease, or unspecified chronic kidney disease: Secondary | ICD-10-CM | POA: Diagnosis not present

## 2016-12-29 DIAGNOSIS — C9510 Chronic leukemia of unspecified cell type not having achieved remission: Secondary | ICD-10-CM | POA: Diagnosis not present

## 2016-12-29 DIAGNOSIS — Z87891 Personal history of nicotine dependence: Secondary | ICD-10-CM | POA: Diagnosis not present

## 2016-12-29 DIAGNOSIS — M25531 Pain in right wrist: Secondary | ICD-10-CM | POA: Diagnosis not present

## 2016-12-29 LAB — I-STAT CG4 LACTIC ACID, ED: LACTIC ACID, VENOUS: 2.3 mmol/L — AB (ref 0.5–1.9)

## 2016-12-29 LAB — COMPREHENSIVE METABOLIC PANEL
ALT: 14 U/L — AB (ref 17–63)
AST: 23 U/L (ref 15–41)
Albumin: 2.4 g/dL — ABNORMAL LOW (ref 3.5–5.0)
Alkaline Phosphatase: 76 U/L (ref 38–126)
Anion gap: 9 (ref 5–15)
BUN: 32 mg/dL — AB (ref 6–20)
CO2: 39 mmol/L — AB (ref 22–32)
CREATININE: 1.8 mg/dL — AB (ref 0.61–1.24)
Calcium: 8.8 mg/dL — ABNORMAL LOW (ref 8.9–10.3)
Chloride: 94 mmol/L — ABNORMAL LOW (ref 101–111)
GFR calc non Af Amer: 34 mL/min — ABNORMAL LOW (ref >60)
GFR, EST AFRICAN AMERICAN: 39 mL/min — AB (ref >60)
Glucose, Bld: 168 mg/dL — ABNORMAL HIGH (ref 65–99)
Potassium: 4.5 mmol/L (ref 3.5–5.1)
SODIUM: 142 mmol/L (ref 135–145)
Total Bilirubin: 1.1 mg/dL (ref 0.3–1.2)
Total Protein: 6.5 g/dL (ref 6.5–8.1)

## 2016-12-29 LAB — CBC WITH DIFFERENTIAL/PLATELET
BASOS ABS: 0 10*3/uL (ref 0.0–0.1)
Basophils Relative: 0 %
EOS ABS: 0 10*3/uL (ref 0.0–0.7)
Eosinophils Relative: 0 %
HCT: 40.3 % (ref 39.0–52.0)
Hemoglobin: 13 g/dL (ref 13.0–17.0)
Lymphocytes Relative: 43 %
Lymphs Abs: 9.4 10*3/uL — ABNORMAL HIGH (ref 0.7–4.0)
MCH: 30.4 pg (ref 26.0–34.0)
MCHC: 32.3 g/dL (ref 30.0–36.0)
MCV: 94.4 fL (ref 78.0–100.0)
MONO ABS: 2.2 10*3/uL — AB (ref 0.1–1.0)
Monocytes Relative: 10 %
NEUTROS PCT: 47 %
Neutro Abs: 10.3 10*3/uL — ABNORMAL HIGH (ref 1.7–7.7)
PLATELETS: 143 10*3/uL — AB (ref 150–400)
RBC: 4.27 MIL/uL (ref 4.22–5.81)
RDW: 18.2 % — ABNORMAL HIGH (ref 11.5–15.5)
WBC: 21.9 10*3/uL — AB (ref 4.0–10.5)

## 2016-12-29 LAB — GLUCOSE, CAPILLARY
GLUCOSE-CAPILLARY: 163 mg/dL — AB (ref 65–99)
Glucose-Capillary: 116 mg/dL — ABNORMAL HIGH (ref 65–99)
Glucose-Capillary: 102 mg/dL — ABNORMAL HIGH (ref 65–99)
Glucose-Capillary: 251 mg/dL — ABNORMAL HIGH (ref 65–99)

## 2016-12-29 LAB — MRSA PCR SCREENING: MRSA by PCR: NEGATIVE

## 2016-12-29 LAB — TROPONIN I
TROPONIN I: 0.06 ng/mL — AB (ref <0.03)
Troponin I: 0.05 ng/mL (ref <0.03)
Troponin I: 0.08 ng/mL (ref <0.03)

## 2016-12-29 LAB — URIC ACID: Uric Acid, Serum: 12.1 mg/dL — ABNORMAL HIGH (ref 4.4–7.6)

## 2016-12-29 LAB — PROCALCITONIN: Procalcitonin: 0.27 ng/mL

## 2016-12-29 LAB — SEDIMENTATION RATE: Sed Rate: 90 mm/hr — ABNORMAL HIGH (ref 0–16)

## 2016-12-29 LAB — LACTIC ACID, PLASMA
LACTIC ACID, VENOUS: 1.3 mmol/L (ref 0.5–1.9)
Lactic Acid, Venous: 1.7 mmol/L (ref 0.5–1.9)

## 2016-12-29 LAB — PROTIME-INR
INR: 3.68
PROTHROMBIN TIME: 37.4 s — AB (ref 11.4–15.2)

## 2016-12-29 LAB — INFLUENZA PANEL BY PCR (TYPE A & B)
Influenza A By PCR: NEGATIVE
Influenza B By PCR: NEGATIVE

## 2016-12-29 LAB — TYPE AND SCREEN
ABO/RH(D): O NEG
ANTIBODY SCREEN: NEGATIVE

## 2016-12-29 LAB — C-REACTIVE PROTEIN: CRP: 22.2 mg/dL — ABNORMAL HIGH (ref <1.0)

## 2016-12-29 LAB — APTT: aPTT: 49 seconds — ABNORMAL HIGH (ref 24–36)

## 2016-12-29 MED ORDER — CHLORHEXIDINE GLUCONATE 0.12 % MT SOLN
15.0000 mL | Freq: Two times a day (BID) | OROMUCOSAL | Status: DC
  Administered 2016-12-29 – 2017-01-03 (×10): 15 mL via OROMUCOSAL
  Filled 2016-12-29 (×10): qty 15

## 2016-12-29 MED ORDER — MORPHINE SULFATE (PF) 4 MG/ML IV SOLN
1.0000 mg | Freq: Once | INTRAVENOUS | Status: AC
  Administered 2016-12-29: 1 mg via INTRAVENOUS
  Filled 2016-12-29: qty 1

## 2016-12-29 MED ORDER — BISACODYL 10 MG RE SUPP: 10.0000 mg | Freq: Every day | RECTAL | Status: DC | PRN

## 2016-12-29 MED ORDER — ENSURE ENLIVE PO LIQD
237.0000 mL | Freq: Three times a day (TID) | ORAL | Status: DC
  Administered 2016-12-29 – 2017-01-03 (×14): 237 mL via ORAL

## 2016-12-29 MED ORDER — ACETAMINOPHEN 325 MG PO TABS: 650.0000 mg | ORAL_TABLET | Freq: Four times a day (QID) | ORAL | Status: DC | PRN

## 2016-12-29 MED ORDER — GLIMEPIRIDE 4 MG PO TABS
8.0000 mg | ORAL_TABLET | Freq: Every day | ORAL | Status: DC
  Administered 2016-12-29 – 2017-01-03 (×6): 8 mg via ORAL
  Filled 2016-12-29 (×6): qty 2

## 2016-12-29 MED ORDER — ATORVASTATIN CALCIUM 10 MG PO TABS
10.0000 mg | ORAL_TABLET | Freq: Every day | ORAL | Status: DC
  Administered 2016-12-29 – 2017-01-02 (×5): 10 mg via ORAL
  Filled 2016-12-29 (×5): qty 1

## 2016-12-29 MED ORDER — DIVALPROEX SODIUM 250 MG PO DR TAB
250.0000 mg | DELAYED_RELEASE_TABLET | Freq: Two times a day (BID) | ORAL | Status: DC
  Administered 2016-12-29 – 2017-01-03 (×11): 250 mg via ORAL
  Filled 2016-12-29 (×11): qty 1

## 2016-12-29 MED ORDER — ONDANSETRON HCL 4 MG PO TABS: 4.0000 mg | ORAL_TABLET | Freq: Four times a day (QID) | ORAL | Status: DC | PRN

## 2016-12-29 MED ORDER — BIOTENE DRY MOUTH MT LIQD: 15.0000 mL | Freq: Four times a day (QID) | OROMUCOSAL | Status: DC | PRN

## 2016-12-29 MED ORDER — INSULIN GLARGINE 100 UNIT/ML ~~LOC~~ SOLN
18.0000 [IU] | Freq: Every day | SUBCUTANEOUS | Status: DC
  Administered 2016-12-29 – 2017-01-02 (×5): 18 [IU] via SUBCUTANEOUS
  Filled 2016-12-29 (×6): qty 0.18

## 2016-12-29 MED ORDER — SENNOSIDES-DOCUSATE SODIUM 8.6-50 MG PO TABS: 1.0000 | ORAL_TABLET | Freq: Every evening | ORAL | Status: DC | PRN

## 2016-12-29 MED ORDER — HYDROCODONE-ACETAMINOPHEN 5-325 MG PO TABS
1.0000 | ORAL_TABLET | Freq: Four times a day (QID) | ORAL | Status: DC | PRN
  Administered 2016-12-29 – 2017-01-03 (×7): 1 via ORAL
  Filled 2016-12-29 (×7): qty 1

## 2016-12-29 MED ORDER — ADULT MULTIVITAMIN W/MINERALS CH
1.0000 | ORAL_TABLET | Freq: Every day | ORAL | Status: DC
  Administered 2016-12-29 – 2017-01-03 (×6): 1 via ORAL
  Filled 2016-12-29 (×6): qty 1

## 2016-12-29 MED ORDER — ACETAMINOPHEN 650 MG RE SUPP: 650.0000 mg | Freq: Four times a day (QID) | RECTAL | Status: DC | PRN

## 2016-12-29 MED ORDER — METHYLPREDNISOLONE SODIUM SUCC 40 MG IJ SOLR
40.0000 mg | Freq: Every day | INTRAMUSCULAR | Status: DC
  Administered 2016-12-29 – 2016-12-30 (×2): 40 mg via INTRAVENOUS
  Filled 2016-12-29 (×2): qty 1

## 2016-12-29 MED ORDER — NITROGLYCERIN 0.4 MG SL SUBL: 0.4000 mg | SUBLINGUAL_TABLET | SUBLINGUAL | Status: DC | PRN

## 2016-12-29 MED ORDER — INSULIN ASPART 100 UNIT/ML ~~LOC~~ SOLN
0.0000 [IU] | Freq: Three times a day (TID) | SUBCUTANEOUS | Status: DC
  Administered 2016-12-29: 2 [IU] via SUBCUTANEOUS
  Administered 2016-12-30: 1 [IU] via SUBCUTANEOUS
  Administered 2016-12-30: 5 [IU] via SUBCUTANEOUS
  Administered 2016-12-30 – 2016-12-31 (×2): 2 [IU] via SUBCUTANEOUS
  Administered 2016-12-31: 5 [IU] via SUBCUTANEOUS
  Administered 2016-12-31: 2 [IU] via SUBCUTANEOUS
  Administered 2017-01-01: 1 [IU] via SUBCUTANEOUS
  Administered 2017-01-01: 7 [IU] via SUBCUTANEOUS
  Administered 2017-01-01 – 2017-01-02 (×2): 2 [IU] via SUBCUTANEOUS
  Administered 2017-01-02: 9 [IU] via SUBCUTANEOUS
  Administered 2017-01-03: 7 [IU] via SUBCUTANEOUS
  Administered 2017-01-03 (×2): 1 [IU] via SUBCUTANEOUS

## 2016-12-29 MED ORDER — METOPROLOL SUCCINATE ER 50 MG PO TB24
50.0000 mg | ORAL_TABLET | Freq: Every day | ORAL | Status: DC
  Administered 2016-12-29: 50 mg via ORAL
  Filled 2016-12-29: qty 1

## 2016-12-29 MED ORDER — ENSURE ENLIVE PO LIQD: 237.0000 mL | Freq: Two times a day (BID) | ORAL | Status: DC

## 2016-12-29 MED ORDER — ORAL CARE MOUTH RINSE
15.0000 mL | Freq: Two times a day (BID) | OROMUCOSAL | Status: DC
Start: 2016-12-29 — End: 2017-01-03
  Administered 2016-12-29 – 2017-01-03 (×11): 15 mL via OROMUCOSAL

## 2016-12-29 MED ORDER — POLYETHYLENE GLYCOL 3350 17 G PO PACK
17.0000 g | PACK | Freq: Every day | ORAL | Status: DC
Start: 2016-12-29 — End: 2017-01-03
  Administered 2016-12-30 – 2017-01-03 (×5): 17 g via ORAL
  Filled 2016-12-29 (×5): qty 1

## 2016-12-29 MED ORDER — MOMETASONE FURO-FORMOTEROL FUM 200-5 MCG/ACT IN AERO
2.0000 | INHALATION_SPRAY | Freq: Two times a day (BID) | RESPIRATORY_TRACT | Status: DC
  Administered 2016-12-30 – 2017-01-03 (×3): 2 via RESPIRATORY_TRACT
  Filled 2016-12-29 (×3): qty 8.8

## 2016-12-29 MED ORDER — MELATONIN 3 MG PO TABS
6.0000 mg | ORAL_TABLET | Freq: Every evening | ORAL | Status: DC | PRN
Start: 2016-12-29 — End: 2017-01-03
  Administered 2017-01-01 – 2017-01-02 (×3): 6 mg via ORAL
  Filled 2016-12-29 (×5): qty 2

## 2016-12-29 MED ORDER — ONDANSETRON HCL 4 MG/2ML IJ SOLN: 4.0000 mg | Freq: Four times a day (QID) | INTRAMUSCULAR | Status: DC | PRN

## 2016-12-29 MED ORDER — IPRATROPIUM-ALBUTEROL 0.5-2.5 (3) MG/3ML IN SOLN
3.0000 mL | Freq: Four times a day (QID) | RESPIRATORY_TRACT | Status: DC | PRN
  Administered 2017-01-02: 3 mL via RESPIRATORY_TRACT
  Filled 2016-12-29: qty 3

## 2016-12-29 NOTE — ED Notes (Signed)
To xray on monitor

## 2016-12-29 NOTE — Consult Note (Signed)
William Braun is an 83 y.o. male.   Chief Complaint: right wrist pain HPI: 83 yo rhd male states right wrist has been hurting for at least two weeks.  He does not remember any injury to the wrist.  No falls or wounds.  The pain has not improved, but has not worsened either.  He rates it at 8/10 in severity.  It is alleviated by nothing and worsened with palpation.  No fevers, chills, night sweats.  He does not feel ill.  Case discussed with Dr. Posey Pronto and his note from 12/29/2016 reviewed. Xrays viewed and interpreted by me: ap, lateral, oblique views of right wrist show no fracture dislocation, radioopaque foreign body.  No bony lucencies or signs of osteomyelitis. Labs reviewed: WBC 21.9; Uric acid 12.1  Allergies:  Allergies  Allergen Reactions  . Quinine Palpitations and Other (See Comments)    Seizure     Past Medical History:  Diagnosis Date  . Atrial fibrillation ([place])   . Cerebrovascular disease   . CHF (congestive heart failure) ([place])   . CLL (chronic lymphocytic leukemia) ([place]) 11/17/2014  . COPD (chronic obstructive pulmonary disease) ([place])   . History of thrombocytopenia   . Obesity   . Osteoarthritis   . Other and unspecified hyperlipidemia   . Type II or unspecified type diabetes mellitus without mention of complication, not stated as uncontrolled   . Unspecified essential hypertension     Past Surgical History:  Procedure Laterality Date  . KNEE ARTHROSCOPY W/ ALLOGRAFT IMPANT    . VASECTOMY      Family History: Family History  Problem Relation Age of Onset  . Heart disease Father     Social History:   reports that he quit smoking about 38 years ago. His smoking use included Cigarettes. He started smoking about 70 years ago. He has a 68.00 pack-year smoking history. He has never used smokeless tobacco. He reports that he does not drink alcohol. His drug history is not on file.  Medications: Medications Prior to Admission  Medication Sig Dispense Refill  .  albuterol (VENTOLIN HFA) 108 (90 BASE) MCG/ACT inhaler Inhale 2 puffs into the lungs every 6 (six) hours as needed for wheezing or shortness of breath. 3 Inhaler 1  . alum & mag hydroxide-simeth (MAALOX/MYLANTA) 200-200-20 MG/5ML suspension Take 30 mLs by mouth as needed for indigestion or heartburn. (Patient taking differently: Take 30 mLs by mouth every 4 (four) hours as needed for indigestion or heartburn. ) 355 mL 0  . antiseptic oral rinse (BIOTENE) LIQD 15 mLs by Mouth Rinse route every 6 (six) hours as needed for dry mouth.    Marland Kitchen atorvastatin (LIPITOR) 10 MG tablet Take 1 tablet (10 mg total) by mouth daily at 6 PM. 30 tablet 0  . bisacodyl (DULCOLAX) 10 MG suppository Place 10 mg rectally daily as needed for moderate constipation.    . budesonide-formoterol (SYMBICORT) 160-4.5 MCG/ACT inhaler Inhale 2 puffs into the lungs 2 (two) times daily. 2 puffs twice daily 3 Inhaler 3  . divalproex (DEPAKOTE) 250 MG DR tablet Take 250 mg by mouth 2 (two) times daily.    Marland Kitchen glimepiride (AMARYL) 4 MG tablet Take 8 mg by mouth daily with breakfast.    . HYDROcodone-acetaminophen (NORCO/VICODIN) 5-325 MG tablet Take 1 tablet by mouth every 6 (six) hours as needed for moderate pain or severe pain. 120 tablet 0  . Insulin Glargine (LANTUS) 100 UNIT/ML Solostar Pen Inject 18 Units into the skin daily at 10 pm.  15 mL 0  . ipratropium-albuterol (DUONEB) 0.5-2.5 (3) MG/3ML SOLN Take 3 mLs by nebulization every 6 (six) hours as needed. (Patient taking differently: Take 3 mLs by nebulization every 6 (six) hours as needed (COPD). ) 360 mL 6  . Liniments (SALONPAS EX) Apply 1 patch topically every 12 (twelve) hours. With 4% lidocaine    . magnesium hydroxide (MILK OF MAGNESIA) 400 MG/5ML suspension Take 30 mLs by mouth daily as needed for mild constipation.    . Melatonin 3 MG TABS Take 6 mg by mouth at bedtime as needed (sleep).     . metoprolol succinate (TOPROL-XL) 25 MG 24 hr tablet Take 2 tablets (50 mg total) by  mouth daily. 60 tablet 0  . mupirocin ointment (BACTROBAN) 2 % Apply 1 application topically 2 (two) times daily. 22 g 0  . nitroGLYCERIN (NITROSTAT) 0.4 MG SL tablet Place 1 tablet (0.4 mg total) under the tongue every 5 (five) minutes as needed for chest pain. 25 tablet 3  . Nutritional Supplements (NUTRITIONAL SUPPLEMENT PO) Take 120 mLs by mouth 3 (three) times daily. Medpass    . OXYGEN Inhale 3 L into the lungs continuous.     . polyethylene glycol (MIRALAX / GLYCOLAX) packet Take 17 g by mouth daily. 14 each 0  . potassium chloride SA (K-DUR,KLOR-CON) 20 MEQ tablet Take 2 tablets (40 mEq total) by mouth 2 (two) times daily. 120 tablet 6  . senna-docusate (SENOKOT-S) 8.6-50 MG tablet Take 1 tablet by mouth at bedtime as needed for mild constipation. 30 tablet 0  . Sodium Phosphates (RA SALINE ENEMA RE) Place 1 Applicatorful rectally daily as needed (constipation).    Marland Kitchen tiotropium (SPIRIVA) 18 MCG inhalation capsule Place 1 capsule (18 mcg total) into inhaler and inhale daily. 90 capsule 3  . torsemide (DEMADEX) 20 MG tablet Take 3 tablets (60 mg total) by mouth 2 (two) times daily. 180 tablet 6  . warfarin (COUMADIN) 3 MG tablet Coumadin 3 mg daily on Monday, Wednesday, Thursday, Friday and Coumadin 4.5 mg daily on Tuesday and Saturdays (Patient taking differently: Take 3 mg by mouth daily. )    . metolazone (ZAROXOLYN) 2.5 MG tablet Take 1 tablet (2.5 mg total) by mouth daily. 5 tablet 1    Results for orders placed or performed during the hospital encounter of 12/28/16 (from the past 48 hour(s))  Comprehensive metabolic panel     Status: Abnormal   Collection Time: 12/28/16  7:55 PM  Result Value Ref Range   Sodium 139 135 - 145 mmol/L   Potassium 4.7 3.5 - 5.1 mmol/L   Chloride 91 (L) 101 - 111 mmol/L   CO2 35 (H) 22 - 32 mmol/L   Glucose, Bld 234 (H) 65 - 99 mg/dL   BUN 32 (H) 6 - 20 mg/dL   Creatinine, Ser 1.77 (H) 0.61 - 1.24 mg/dL   Calcium 9.2 8.9 - 10.3 mg/dL   Total Protein  7.1 6.5 - 8.1 g/dL   Albumin 2.7 (L) 3.5 - 5.0 g/dL   AST 30 15 - 41 U/L   ALT 16 (L) 17 - 63 U/L   Alkaline Phosphatase 88 38 - 126 U/L   Total Bilirubin 0.7 0.3 - 1.2 mg/dL   GFR calc non Af Amer 34 (L) >60 mL/min   GFR calc Af Amer 40 (L) >60 mL/min    Comment: (NOTE) The eGFR has been calculated using the CKD EPI equation. This calculation has not been validated in all clinical situations. eGFR's persistently <  60 mL/min signify possible Chronic Kidney Disease.    Anion gap 13 5 - 15  CBC with Differential     Status: Abnormal   Collection Time: 12/28/16  7:55 PM  Result Value Ref Range   WBC 24.6 (H) 4.0 - 10.5 K/uL   RBC 4.51 4.22 - 5.81 MIL/uL   Hemoglobin 13.8 13.0 - 17.0 g/dL   HCT 42.3 39.0 - 52.0 %   MCV 93.8 78.0 - 100.0 fL   MCH 30.6 26.0 - 34.0 pg   MCHC 32.6 30.0 - 36.0 g/dL   RDW 18.4 (H) 11.5 - 15.5 %   Platelets 148 (L) 150 - 400 K/uL   Neutrophils Relative % 48 %   Lymphocytes Relative 41 %   Monocytes Relative 8 %   Eosinophils Relative 2 %   Basophils Relative 1 %   Band Neutrophils 0 %   Metamyelocytes Relative 0 %   Myelocytes 0 %   Promyelocytes Absolute 0 %   Blasts 0 %   nRBC 0 0 /100 WBC   Other 0 %   Neutro Abs 11.8 (H) 1.7 - 7.7 K/uL   Lymphs Abs 10.1 (H) 0.7 - 4.0 K/uL   Monocytes Absolute 2.0 (H) 0.1 - 1.0 K/uL   Eosinophils Absolute 0.5 0.0 - 0.7 K/uL   Basophils Absolute 0.2 (H) 0.0 - 0.1 K/uL   RBC Morphology POLYCHROMASIA PRESENT    WBC Morphology SMUDGE CELLS     Comment: ATYPICAL LYMPHOCYTES  Culture, blood (routine x 2)     Status: None (Preliminary result)   Collection Time: 12/28/16  7:55 PM  Result Value Ref Range   Specimen Description BLOOD LEFT FOREARM    Special Requests      BOTTLES DRAWN AEROBIC AND ANAEROBIC Blood Culture adequate volume   Culture NO GROWTH < 24 HOURS    Report Status PENDING   Brain natriuretic peptide     Status: Abnormal   Collection Time: 12/28/16  7:55 PM  Result Value Ref Range   B  Natriuretic Peptide 141.7 (H) 0.0 - 100.0 pg/mL  I-stat troponin, ED     Status: None   Collection Time: 12/28/16  8:10 PM  Result Value Ref Range   Troponin i, poc 0.00 0.00 - 0.08 ng/mL   Comment 3            Comment: Due to the release kinetics of cTnI, a negative result within the first hours of the onset of symptoms does not rule out myocardial infarction with certainty. If myocardial infarction is still suspected, repeat the test at appropriate intervals.   Culture, blood (routine x 2)     Status: None (Preliminary result)   Collection Time: 12/28/16  8:25 PM  Result Value Ref Range   Specimen Description BLOOD RIGHT HAND    Special Requests      BOTTLES DRAWN AEROBIC AND ANAEROBIC Blood Culture adequate volume   Culture NO GROWTH < 24 HOURS    Report Status PENDING   I-Stat CG4 Lactic Acid, ED     Status: Abnormal   Collection Time: 12/28/16  8:27 PM  Result Value Ref Range   Lactic Acid, Venous 3.88 (HH) 0.5 - 1.9 mmol/L   Comment NOTIFIED PHYSICIAN   Blood gas, arterial     Status: Abnormal   Collection Time: 12/28/16  8:30 PM  Result Value Ref Range   FIO2 40.00    Delivery systems BILEVEL POSITIVE AIRWAY PRESSURE    Mode BILEVEL POSITIVE AIRWAY PRESSURE  LHR 10 resp/min   Inspiratory PAP 15    Expiratory PAP 5    pH, Arterial 7.495 (H) 7.350 - 7.450   pCO2 arterial 46.4 32.0 - 48.0 mmHg   pO2, Arterial 116 (H) 83.0 - 108.0 mmHg   Bicarbonate 35.4 (H) 20.0 - 28.0 mmol/L   Acid-Base Excess 11.4 (H) 0.0 - 2.0 mmol/L   O2 Saturation 98.6 %   Patient temperature 98.6    Collection site LEFT RADIAL    Drawn by [phone]    Sample type ARTERIAL DRAW    Allens test (pass/fail) PASS PASS  Urinalysis, Routine w reflex microscopic     Status: Abnormal   Collection Time: 12/28/16  9:50 PM  Result Value Ref Range   Color, Urine AMBER (A) YELLOW    Comment: BIOCHEMICALS MAY BE AFFECTED BY COLOR   APPearance TURBID (A) CLEAR   Specific Gravity, Urine 1.008 1.005 - 1.030    pH 6.0 5.0 - 8.0   Glucose, UA NEGATIVE NEGATIVE mg/dL   Hgb urine dipstick MODERATE (A) NEGATIVE   Bilirubin Urine NEGATIVE NEGATIVE   Ketones, ur NEGATIVE NEGATIVE mg/dL   Protein, ur 100 (A) NEGATIVE mg/dL   Nitrite POSITIVE (A) NEGATIVE   Leukocytes, UA LARGE (A) NEGATIVE   RBC / HPF TOO NUMEROUS TO COUNT 0 - 5 RBC/hpf   WBC, UA TOO NUMEROUS TO COUNT 0 - 5 WBC/hpf   Bacteria, UA MANY (A) NONE SEEN   Squamous Epithelial / LPF NONE SEEN NONE SEEN   WBC Clumps PRESENT    Budding Yeast PRESENT   I-Stat Arterial Blood Gas, ED - (order at [facility] and [facility] only)     Status: Abnormal   Collection Time: 12/28/16 10:13 PM  Result Value Ref Range   pH, Arterial 7.528 (H) 7.350 - 7.450   pCO2 arterial 49.7 (H) 32.0 - 48.0 mmHg   pO2, Arterial 103.0 83.0 - 108.0 mmHg   Bicarbonate 41.3 (H) 20.0 - 28.0 mmol/L   TCO2 43 0 - 100 mmol/L   O2 Saturation 98.0 %   Acid-Base Excess 16.0 (H) 0.0 - 2.0 mmol/L   Patient temperature HIDE    Collection site RADIAL, ALLEN'S TEST ACCEPTABLE    Sample type ARTERIAL   Influenza panel by PCR (type A & B)     Status: None   Collection Time: 12/28/16 10:58 PM  Result Value Ref Range   Influenza A By PCR NEGATIVE NEGATIVE   Influenza B By PCR NEGATIVE NEGATIVE    Comment: (NOTE) The Xpert Xpress Flu assay is intended as an aid in the diagnosis of  influenza and should not be used as a sole basis for treatment.  This  assay is FDA approved for nasopharyngeal swab specimens only. Nasal  washings and aspirates are unacceptable for Xpert Xpress Flu testing.   I-Stat CG4 Lactic Acid, ED  (not at  [facility])     Status: Abnormal   Collection Time: 12/29/16 12:52 AM  Result Value Ref Range   Lactic Acid, Venous 2.30 (HH) 0.5 - 1.9 mmol/L   Comment NOTIFIED PHYSICIAN   Troponin I (q 6hr x 3)     Status: Abnormal   Collection Time: 12/29/16  2:31 AM  Result Value Ref Range   Troponin I 0.05 (HH) <0.03 ng/mL    Comment: CRITICAL RESULT CALLED TO, READ BACK BY AND  VERIFIED WITH: E.DODOO,RN 0401 12/29/16 M.CAMPBELL   Protime-INR     Status: Abnormal   Collection Time: 12/29/16  2:31 AM  Result Value Ref Range   Prothrombin Time 37.4 (H) 11.4 - 15.2 seconds   INR 3.68   CBC with Differential     Status: Abnormal   Collection Time: 12/29/16  2:31 AM  Result Value Ref Range   WBC 21.9 (H) 4.0 - 10.5 K/uL   RBC 4.27 4.22 - 5.81 MIL/uL   Hemoglobin 13.0 13.0 - 17.0 g/dL   HCT 40.3 39.0 - 52.0 %   MCV 94.4 78.0 - 100.0 fL   MCH 30.4 26.0 - 34.0 pg   MCHC 32.3 30.0 - 36.0 g/dL   RDW 18.2 (H) 11.5 - 15.5 %   Platelets 143 (L) 150 - 400 K/uL   Neutrophils Relative % 47 %   Lymphocytes Relative 43 %   Monocytes Relative 10 %   Eosinophils Relative 0 %   Basophils Relative 0 %   Neutro Abs 10.3 (H) 1.7 - 7.7 K/uL   Lymphs Abs 9.4 (H) 0.7 - 4.0 K/uL   Monocytes Absolute 2.2 (H) 0.1 - 1.0 K/uL   Eosinophils Absolute 0.0 0.0 - 0.7 K/uL   Basophils Absolute 0.0 0.0 - 0.1 K/uL   WBC Morphology SMUDGE CELLS     Comment: ATYPICAL LYMPHOCYTES  Comprehensive metabolic panel     Status: Abnormal   Collection Time: 12/29/16  2:31 AM  Result Value Ref Range   Sodium 142 135 - 145 mmol/L   Potassium 4.5 3.5 - 5.1 mmol/L   Chloride 94 (L) 101 - 111 mmol/L   CO2 39 (H) 22 - 32 mmol/L   Glucose, Bld 168 (H) 65 - 99 mg/dL   BUN 32 (H) 6 - 20 mg/dL   Creatinine, Ser 1.80 (H) 0.61 - 1.24 mg/dL   Calcium 8.8 (L) 8.9 - 10.3 mg/dL   Total Protein 6.5 6.5 - 8.1 g/dL   Albumin 2.4 (L) 3.5 - 5.0 g/dL   AST 23 15 - 41 U/L   ALT 14 (L) 17 - 63 U/L   Alkaline Phosphatase 76 38 - 126 U/L   Total Bilirubin 1.1 0.3 - 1.2 mg/dL   GFR calc non Af Amer 34 (L) >60 mL/min   GFR calc Af Amer 39 (L) >60 mL/min    Comment: (NOTE) The eGFR has been calculated using the CKD EPI equation. This calculation has not been validated in all clinical situations. eGFR's persistently <60 mL/min signify possible Chronic Kidney Disease.    Anion gap 9 5 - 15  Lactic acid, plasma      Status: None   Collection Time: 12/29/16  2:31 AM  Result Value Ref Range   Lactic Acid, Venous 1.7 0.5 - 1.9 mmol/L  Procalcitonin     Status: None   Collection Time: 12/29/16  2:31 AM  Result Value Ref Range   Procalcitonin 0.27 ng/mL    Comment:        Interpretation: PCT (Procalcitonin) <= 0.5 ng/mL: Systemic infection (sepsis) is not likely. Local bacterial infection is possible. (NOTE)         ICU PCT Algorithm               Non ICU PCT Algorithm    ----------------------------     ------------------------------         PCT < 0.25 ng/mL                 PCT < 0.1 ng/mL     Stopping of antibiotics            Stopping of  antibiotics       strongly encouraged.               strongly encouraged.    ----------------------------     ------------------------------       PCT level decrease by               PCT < 0.25 ng/mL       >= 80% from peak PCT       OR PCT 0.25 - 0.5 ng/mL          Stopping of antibiotics                                             encouraged.     Stopping of antibiotics           encouraged.    ----------------------------     ------------------------------       PCT level decrease by              PCT >= 0.25 ng/mL       < 80% from peak PCT        AND PCT >= 0.5 ng/mL            Continuin g antibiotics                                              encouraged.       Continuing antibiotics            encouraged.    ----------------------------     ------------------------------     PCT level increase compared          PCT > 0.5 ng/mL         with peak PCT AND          PCT >= 0.5 ng/mL             Escalation of antibiotics                                          strongly encouraged.      Escalation of antibiotics        strongly encouraged.   APTT     Status: Abnormal   Collection Time: 12/29/16  2:31 AM  Result Value Ref Range   aPTT 49 (H) 24 - 36 seconds    Comment:        IF BASELINE aPTT IS ELEVATED, SUGGEST PATIENT RISK ASSESSMENT BE USED TO  DETERMINE APPROPRIATE ANTICOAGULANT THERAPY.   Lactic acid, plasma     Status: None   Collection Time: 12/29/16  5:16 AM  Result Value Ref Range   Lactic Acid, Venous 1.3 0.5 - 1.9 mmol/L  Sedimentation rate     Status: Abnormal   Collection Time: 12/29/16  5:16 AM  Result Value Ref Range   Sed Rate 90 (H) 0 - 16 mm/hr  Uric acid     Status: Abnormal   Collection Time: 12/29/16  5:16 AM  Result Value Ref Range   Uric Acid, Serum 12.1 (H) 4.4 - 7.6 mg/dL  Type and screen [place]     Status:  None   Collection Time: 12/29/16  5:16 AM  Result Value Ref Range   ABO/RH(D) O NEG    Antibody Screen NEG    Sample Expiration 01/01/2017   MRSA PCR Screening     Status: None   Collection Time: 12/29/16  5:28 AM  Result Value Ref Range   MRSA by PCR NEGATIVE NEGATIVE    Comment:        The GeneXpert MRSA Assay (FDA approved for NASAL specimens only), is one component of a comprehensive MRSA colonization surveillance program. It is not intended to diagnose MRSA infection nor to guide or monitor treatment for MRSA infections.   Troponin I (q 6hr x 3)     Status: Abnormal   Collection Time: 12/29/16  8:14 AM  Result Value Ref Range   Troponin I 0.08 (HH) <0.03 ng/mL    Comment: CRITICAL VALUE NOTED.  VALUE IS CONSISTENT WITH PREVIOUSLY REPORTED AND CALLED VALUE.  C-reactive protein     Status: Abnormal   Collection Time: 12/29/16  8:14 AM  Result Value Ref Range   CRP 22.2 (H) <1.0 mg/dL  Glucose, capillary     Status: Abnormal   Collection Time: 12/29/16  8:26 AM  Result Value Ref Range   Glucose-Capillary 116 (H) 65 - 99 mg/dL  Glucose, capillary     Status: Abnormal   Collection Time: 12/29/16 12:40 PM  Result Value Ref Range   Glucose-Capillary 102 (H) 65 - 99 mg/dL  Troponin I (q 6hr x 3)     Status: Abnormal   Collection Time: 12/29/16  2:48 PM  Result Value Ref Range   Troponin I 0.06 (HH) <0.03 ng/mL    Comment: CRITICAL VALUE NOTED.  VALUE IS  CONSISTENT WITH PREVIOUSLY REPORTED AND CALLED VALUE.  Glucose, capillary     Status: Abnormal   Collection Time: 12/29/16  4:17 PM  Result Value Ref Range   Glucose-Capillary 163 (H) 65 - 99 mg/dL    Dg Wrist Complete Right  Result Date: 12/29/2016 CLINICAL DATA:  Acute onset of right wrist pain and swelling. Initial encounter. EXAM: RIGHT WRIST - COMPLETE 3+ VIEW COMPARISON:  None. FINDINGS: There is no evidence of fracture or dislocation. Mild degenerative change is noted about the distal first metacarpal, with osteophyte formation and subcortical cyst formation. Mild degenerative change is noted about the first interphalangeal joint. The carpal rows are intact, and demonstrate normal alignment. The joint spaces are preserved. Diffuse vascular calcifications are seen. Diffuse soft tissue swelling is noted about the wrist. IMPRESSION: 1. No evidence of fracture or dislocation. 2. Mild degenerative change about the thumb. 3. Diffuse vascular calcifications seen. Electronically Signed   By: Garald Balding M.D.   On: 12/29/2016 00:25   Dg Chest Portable 1 View  Result Date: 12/28/2016 CLINICAL DATA:  Dyspnea, history of atrial fibrillation, COPD and CHF with hypertension and diabetes. EXAM: PORTABLE CHEST 1 VIEW COMPARISON:  12/26/2016 FINDINGS: The heart is top-normal in size. Patient is slightly rotated to the right on current exam. No aortic aneurysm is identified. Chronic elevation of the right hemidiaphragm versus eventration is noted with adjacent atelectasis. Suggestion of mild interstitial edema given increased interstitial lung markings. No effusion or pneumothorax. No acute nor suspicious osseous abnormalities. Chronic degenerative changes are noted along the [facility] and glenohumeral joints. IMPRESSION: Mild interstitial edema suggested. No pneumonic consolidation. Stable borderline cardiomegaly. Electronically Signed   By: Ashley Royalty M.D.   On: 12/28/2016 20:22     Pertinent items noted in  HPI  and remainder of comprehensive ROS otherwise negative. Review of Systems: No fevers, chills, night sweats, chest pain, nausea, vomiting, diarrhea, constipation, easy bleeding or bruising, headaches, dizziness, vision changes, fainting.   Blood pressure 111/68, pulse 95, temperature 98.4 F (36.9 C), temperature source Oral, resp. rate 20, height _0  (1.88 m), weight 104.9 kg (231 lb 3.2 oz), SpO2 95 %.  General appearance: alert, cooperative and appears stated age Head: Normocephalic, without obvious abnormality, atraumatic Neck: supple, symmetrical, trachea midline Extremities: Intact sensation and capillary refill all digits.  +epl/fpl/io.  No wounds.  Right wrist swollen and tender to palpation.  Pain with motion of wrist outside small arc.  No proximal streaking/lymphangitis.   Pulses: 2+ and symmetric Skin: Skin color, texture, turgor normal. No rashes or lesions Neurologic: Grossly normal Incision/Wound: none  Assessment/Plan Suspect gout right wrist.  Infection possible but seems less likely.  Chronically elevated WBC.  Uric acid 12.1.  Patient does not feel ill and expect infection of two weeks duration to look significantly worse.  Recommend splint for comfort and medical management of gout.  Will follow.  Ahriyah Vannest R 12/29/2016, 5:39 PM

## 2016-12-29 NOTE — Progress Notes (Signed)
[place] OF CARE NOTE Patient: MAXIMOS ZAYAS SWF:[id number]   PCP: Leeroy Cha, MD DOB: 1933/04/20   DOA: 12/28/2016   DOS: 12/29/2016    Patient was admitted by my colleague Dr. Hal Hope  earlier on 12/29/2016. I have reviewed the H&P as well as assessment and plan and agree with the same. Important changes in the plan are listed below.  Plan of care: Active Problems:   Obesity hypoventilation syndrome (HCC)   Essential hypertension   ATRIAL FIBRILLATION   CLL (chronic lymphocytic leukemia) (HCC)   Type II diabetes mellitus with neurological manifestations (HCC)   Pain and swelling of right wrist   Sepsis ([place])   Acute respiratory failure (HCC)   Malnutrition of moderate degree    Author: Berle Mull, MD [facility] Pager: [phone] 12/29/2016 4:54 PM   If 7PM-7AM, please contact night-coverage at [email or web address], password [facility]

## 2016-12-29 NOTE — Progress Notes (Signed)
Initial Nutrition Assessment  DOCUMENTATION CODES:   Non-severe (moderate) malnutrition in context of chronic illness  INTERVENTION:  Provide Ensure Enlive po TID after meals, each supplement provides 350 kcal and 20 grams of protein  Provide Multivitamin with minerals daily   NUTRITION DIAGNOSIS:   Malnutrition related to chronic illness as evidenced by percent weight loss, mild depletion of muscle mass.   GOAL:   Patient will meet greater than or equal to 90% of their needs   MONITOR:   PO intake, Supplement acceptance, Skin, Weight trends, Labs, I & O's  REASON FOR ASSESSMENT:   Malnutrition Screening Tool    ASSESSMENT:   83 y.o. male with chronic diastolic CHF, atrial fibrillation, diabetes mellitus, chronic kidney disease, sleep apnea, hypertension was brought to the ER after patient was acutely short of breath since last evening around 4 PM.   Pt confused at time of visit. He states that he doesn't know how he has been eating recently. Per H&P pt was receiving 120 ml of MedPass TID PTA. Pt drank Ensure shake this afternoon and is agreeable to continuing nutritional supplements. Per sign in pt room, pt is bed bound and total care.  Pt has mild muscle wasting on exam. Weight history shows that ot has lost 20% of his body weight in the past 9 months and 11% in the past 4 months.   Labs: elevated CRP, low chloride  Diet Order:  Diet Heart Room service appropriate? Yes; Fluid consistency: Thin  Skin:  Reviewed, no issues  Last BM:  4/11  Height:   Ht Readings from Last 1 Encounters:  12/29/16 6\' 2"  (1.88 m)    Weight:   Wt Readings from Last 1 Encounters:  12/29/16 231 lb 3.2 oz (104.9 kg)    Ideal Body Weight:  86.36 kg  BMI:  Body mass index is 29.68 kg/m.  Estimated Nutritional Needs:   Kcal:  1900-2100  Protein:  100-125 grams  Fluid:  2.1 L/day  EDUCATION NEEDS:   No education needs identified at this time  Scarlette Ar RD, LDN,  CSP Inpatient Clinical Dietitian Pager: [phone] After Hours Pager: [phone]

## 2016-12-29 NOTE — ED Notes (Signed)
Family out to desk asking to speak to MD regarding hand pain. Notified admitting MD Hal Hope, who wants to see patient prior to placing orders. Family aware.

## 2016-12-29 NOTE — ED Notes (Signed)
HR elevated with changing linens. MD concerned for AFIB RVR. EKG done, given to MD Hal Hope at bedside. HR up to 124. Resting now, HR 96. MD okay with patient going to tele.

## 2016-12-29 NOTE — Clinical Social Work Note (Signed)
Clinical Social Work Assessment  Patient Details  Name: William Braun MRN: [id number] Date of Birth: 10/31/1932  Date of referral:  12/29/16               Reason for consult:  Discharge Planning                Permission sought to share information with:  Facility Sport and exercise psychologist, Family Supports Permission granted to share information::  Yes, Verbal Permission Granted  Name::     William Braun  Agency::  [facility]  Relationship::  Wife  Contact Information:  [phone]  Housing/Transportation Living arrangements for the past 2 months:  [place] of Information:  Patient, Medical Team, Spouse Patient Interpreter Needed:  None Criminal Activity/Legal Involvement Pertinent to Current Situation/Hospitalization:  No - Comment as needed Significant Relationships:  Spouse, Adult Children Lives with:  Facility Resident, Spouse Do you feel safe going back to the place where you live?  Yes Need for family participation in patient care:  Yes (Comment)  Care giving concerns:  Patient was admitted from [facility].   Social Worker assessment / plan:  CSW met with patient. Wife at bedside. CSW introduced role and explained that discharge planning would be discussed. Patient and his wife confirmed that he was admitted from [facility] and the plan is for him to return once stable for discharge. Patient will need to be evaluated by PT/OT prior to return for insurance authorization. No further concerns. CSW encouraged patient and his wife to contact CSW as needed. CSW will continue to follow patient and his wife for support and facilitate discharge to SNF once medically stable.  Employment status:  Retired Forensic scientist:  Other (Comment Required) ([employer]) PT Recommendations:  Not assessed at this time Information / Referral to community resources:  [place]  Patient/Family's Response to care:  Patient and his wife agreeable to return  to SNF. Patient's wife and son supportive and involved in patient's care. Patient and his wife appreciated social work intervention.  Patient/Family's Understanding of and Emotional Response to Diagnosis, Current Treatment, and Prognosis:  Patient and his wife appear to have a good understanding of the reason for admission. Patient and his wife appear pleased with hospital care.  Emotional Assessment Appearance:  Appears stated age Attitude/Demeanor/Rapport:  Other (Pleasant) Affect (typically observed):  Accepting, Appropriate, Calm, Pleasant Orientation:  Oriented to Self, Oriented to Place, Oriented to  Time, Oriented to Situation Alcohol / Substance use:  Never Used Psych involvement (Current and /or in the community):  No (Comment)  Discharge Needs  Concerns to be addressed:  Care Coordination Readmission within the last 30 days:  No Current discharge risk:  Dependent with Mobility Barriers to Discharge:  Continued Medical Work up, [employer], LCSW 12/29/2016, 11:28 AM

## 2016-12-29 NOTE — Progress Notes (Signed)
Cc of Living Will, and Gold DNR form has been requested from wife.

## 2016-12-29 NOTE — Progress Notes (Signed)
CRITICAL VALUE ALERT  Critical value received:  Troponin 0/05  Date of notification:  12/29/2016  Time of notification:  0400  Critical value read back: Yes Nurse who received alert:  Hermina Barters  MD notified (1st page):  Forrest Moron NP  Time of first page:  0402 Notified via [place]

## 2016-12-29 NOTE — Progress Notes (Signed)
Orthopedic Tech Progress Note Patient Details:  William Braun 1933-08-26 [id number]  Ortho Devices Type of Ortho Device: Velcro wrist forearm splint Ortho Device/Splint Location: RUE Ortho Device/Splint Interventions: Ordered, Application   Braulio Bosch 12/29/2016, 6:19 PM

## 2016-12-29 NOTE — H&P (Signed)
History and Physical    WONG STEADHAM IRC:[id number] DOB: December 10, 1932 DOA: 12/28/2016  PCP: Leeroy Cha, MD  Patient coming from: Skilled nursing facility.  Chief Complaint: Shortness of breath.  HPI: William Braun is a 83 y.o. male with chronic diastolic CHF, atrial fibrillation, diabetes mellitus, chronic kidney disease, sleep apnea, hypertension was brought to the ER after patient was acutely short of breath since last evening around 4 PM. Has been having nonproductive cough. Denies any chest pain. Patient also has been having persistent right wrist pain since last 24 hours. Denies any fall.    ED Course: EMS at Place patient on BiPAP and was brought to the ER. Chest x-ray shows congestion. Patient is febrile. Patient was initially in A. fib with RVR. UA shows features consistent with UTI. On exam patient also has a right wrist swelling and patient unable to make a fist. Denies any fall. X-rays do not show any fracture. Lactate levels are elevated along with leukocytosis. Patient is a history of CML being followed by oncologist. Patient was started empirically on antibiotics for sepsis probably from UTI and also possible wrist infection.  Review of Systems: As per HPI, rest all negative.   Past Medical History:  Diagnosis Date  . Atrial fibrillation ([place])   . Cerebrovascular disease   . CHF (congestive heart failure) ([place])   . CLL (chronic lymphocytic leukemia) ([place]) 11/17/2014  . COPD (chronic obstructive pulmonary disease) ([place])   . History of thrombocytopenia   . Obesity   . Osteoarthritis   . Other and unspecified hyperlipidemia   . Type II or unspecified type diabetes mellitus without mention of complication, not stated as uncontrolled   . Unspecified essential hypertension     Past Surgical History:  Procedure Laterality Date  . KNEE ARTHROSCOPY W/ ALLOGRAFT IMPANT    . VASECTOMY       reports that he quit smoking about 38 years ago. His smoking use included  Cigarettes. He started smoking about 70 years ago. He has a 68.00 pack-year smoking history. He has never used smokeless tobacco. He reports that he does not drink alcohol. His drug history is not on file.  Allergies  Allergen Reactions  . Quinine Palpitations and Other (See Comments)    Seizure     Family History  Problem Relation Age of Onset  . Heart disease Father     Prior to Admission medications   Medication Sig Start Date End Date Taking? Authorizing Provider  albuterol (VENTOLIN HFA) 108 (90 BASE) MCG/ACT inhaler Inhale 2 puffs into the lungs every 6 (six) hours as needed for wheezing or shortness of breath. 07/23/15  Yes Chesley Mires, MD  alum & mag hydroxide-simeth (MAALOX/MYLANTA) 200-200-20 MG/5ML suspension Take 30 mLs by mouth as needed for indigestion or heartburn. Patient taking differently: Take 30 mLs by mouth every 4 (four) hours as needed for indigestion or heartburn.  11/07/16  Yes Lavina Hamman, MD  antiseptic oral rinse (BIOTENE) LIQD 15 mLs by Mouth Rinse route every 6 (six) hours as needed for dry mouth.   Yes Historical Provider, MD  atorvastatin (LIPITOR) 10 MG tablet Take 1 tablet (10 mg total) by mouth daily at 6 PM. 11/07/16  Yes Lavina Hamman, MD  bisacodyl (DULCOLAX) 10 MG suppository Place 10 mg rectally daily as needed for moderate constipation.   Yes Historical Provider, MD  budesonide-formoterol (SYMBICORT) 160-4.5 MCG/ACT inhaler Inhale 2 puffs into the lungs 2 (two) times daily. 2 puffs twice daily 11/13/15  Yes Chesley Mires, MD  divalproex (DEPAKOTE) 250 MG DR tablet Take 250 mg by mouth 2 (two) times daily.   Yes Historical Provider, MD  glimepiride (AMARYL) 4 MG tablet Take 8 mg by mouth daily with breakfast.   Yes Historical Provider, MD  HYDROcodone-acetaminophen (NORCO/VICODIN) 5-325 MG tablet Take 1 tablet by mouth every 6 (six) hours as needed for moderate pain or severe pain. 11/15/16  Yes Lauree Chandler, NP  Insulin Glargine (LANTUS) 100 UNIT/ML  Solostar Pen Inject 18 Units into the skin daily at 10 pm. 11/07/16  Yes Lavina Hamman, MD  ipratropium-albuterol (DUONEB) 0.5-2.5 (3) MG/3ML SOLN Take 3 mLs by nebulization every 6 (six) hours as needed. Patient taking differently: Take 3 mLs by nebulization every 6 (six) hours as needed (COPD).  03/31/16  Yes Magdalen Spatz, NP  Lin[facility] EX) Apply 1 patch topically every 12 (twelve) hours. With 4% lidocaine   Yes Historical Provider, MD  magnesium hydroxide (MILK OF MAGNESIA) 400 MG/5ML suspension Take 30 mLs by mouth daily as needed for mild constipation.   Yes Historical Provider, MD  Melatonin 3 MG TABS Take 6 mg by mouth at bedtime as needed (sleep).    Yes Historical Provider, MD  metoprolol succinate (TOPROL-XL) 25 MG 24 hr tablet Take 2 tablets (50 mg total) by mouth daily. 11/07/16  Yes Lavina Hamman, MD  mupirocin ointment (BACTROBAN) 2 % Apply 1 application topically 2 (two) times daily. 12/21/16  Yes Trula Slade, DPM  nitroGLYCERIN (NITROSTAT) 0.4 MG SL tablet Place 1 tablet (0.4 mg total) under the tongue every 5 (five) minutes as needed for chest pain. 08/03/11  Yes Liliane Shi, PA-C  Nutritional Supplements (NUTRITIONAL SUPPLEMENT PO) Take 120 mLs by mouth 3 (three) times daily. Medpass   Yes Historical Provider, MD  OXYGEN Inhale 3 L into the lungs continuous.    Yes Historical Provider, MD  polyethylene glycol (MIRALAX / GLYCOLAX) packet Take 17 g by mouth daily. 11/07/16  Yes Lavina Hamman, MD  potassium chloride SA (K-DUR,KLOR-CON) 20 MEQ tablet Take 2 tablets (40 mEq total) by mouth 2 (two) times daily. 12/26/16  Yes Arbutus Leas, NP  senna-docusate (SENOKOT-S) 8.6-50 MG tablet Take 1 tablet by mouth at bedtime as needed for mild constipation. 11/07/16  Yes Lavina Hamman, MD  Sodium Phosphates (RA SALINE ENEMA RE) Place 1 Applicatorful rectally daily as needed (constipation).   Yes Historical Provider, MD  tiotropium (SPIRIVA) 18 MCG inhalation capsule Place 1  capsule (18 mcg total) into inhaler and inhale daily. 11/13/15  Yes Chesley Mires, MD  torsemide (DEMADEX) 20 MG tablet Take 3 tablets (60 mg total) by mouth 2 (two) times daily. 12/26/16  Yes Arbutus Leas, NP  warfarin (COUMADIN) 3 MG tablet Coumadin 3 mg daily on Monday, Wednesday, Thursday, Friday and Coumadin 4.5 mg daily on Tuesday and Saturdays Patient taking differently: Take 3 mg by mouth daily.  12/28/16  Yes Lauree Chandler, NP  metolazone (ZAROXOLYN) 2.5 MG tablet Take 1 tablet (2.5 mg total) by mouth daily. 12/26/16 03/26/17  Arbutus Leas, NP    Physical Exam: Vitals:   12/29/16 0115 12/29/16 0130 12/29/16 0131 12/29/16 0215  BP: 100/67 111/74  101/60  Pulse: 90 94  89  Resp: (!) 28 (!) 28  (!) 24  Temp:   98.9 F (37.2 C) 98 F (36.7 C)  TempSrc:   Oral Oral  SpO2: 98% 95%  98%  Weight:  104.9 kg (231 lb 3.2 oz)      Constitutional: Moderately built and nourished. Vitals:   12/29/16 0115 12/29/16 0130 12/29/16 0131 12/29/16 0215  BP: 100/67 111/74  101/60  Pulse: 90 94  89  Resp: (!) 28 (!) 28  (!) 24  Temp:   98.9 F (37.2 C) 98 F (36.7 C)  TempSrc:   Oral Oral  SpO2: 98% 95%  98%  Weight:    104.9 kg (231 lb 3.2 oz)   Eyes: Anicteric no pallor. ENMT: No discharge from the ears eyes nose and mouth. Neck: No mass felt. No JVD appreciated. Respiratory: No rhonchi or crepitations. Cardiovascular: S1-S2 heard no murmurs appreciated. Abdomen: Soft nontender balls are present. No guarding or rigidity. Musculoskeletal: Right wrist swollen unable to make a fist. Skin: Mild erythema of the right wrist. Neurologic: Alert awake oriented to time place and person. Moves all extremities. Psychiatric: Appears normal. Normal affect.   Labs on Admission: I have personally reviewed following labs and imaging studies  CBC:  Recent Labs Lab 12/28/16 1955  WBC 24.6*  NEUTROABS 11.8*  HGB 13.8  HCT 42.3  MCV 93.8  PLT 174*   Basic Metabolic Panel:  Recent Labs Lab  12/26/16 1449 12/28/16 1955  NA 139 139  K 4.4 4.7  CL 97* 91*  CO2 31 35*  GLUCOSE 195* 234*  BUN 26* 32*  CREATININE 1.52* 1.77*  CALCIUM 8.8* 9.2   GFR: Estimated Creatinine Clearance: 42.3 mL/min (A) (by C-G formula based on SCr of 1.77 mg/dL (H)). Liver Function Tests:  Recent Labs Lab 12/28/16 1955  AST 30  ALT 16*  ALKPHOS 88  BILITOT 0.7  PROT 7.1  ALBUMIN 2.7*   No results for input(s): LIPASE, AMYLASE in the last 168 hours. No results for input(s): AMMONIA in the last 168 hours. Coagulation Profile: No results for input(s): INR, PROTIME in the last 168 hours. Cardiac Enzymes: No results for input(s): CKTOTAL, CKMB, CKMBINDEX, TROPONINI in the last 168 hours. BNP (last 3 results) No results for input(s): PROBNP in the last 8760 hours. HbA1C: No results for input(s): HGBA1C in the last 72 hours. CBG: No results for input(s): GLUCAP in the last 168 hours. Lipid Profile: No results for input(s): CHOL, HDL, LDLCALC, TRIG, CHOLHDL, LDLDIRECT in the last 72 hours. Thyroid Function Tests: No results for input(s): TSH, T4TOTAL, FREET4, T3FREE, THYROIDAB in the last 72 hours. Anemia Panel: No results for input(s): VITAMINB12, FOLATE, FERRITIN, TIBC, IRON, RETICCTPCT in the last 72 hours. Urine analysis:    Component Value Date/Time   COLORURINE AMBER (A) 12/28/2016 2150   APPEARANCEUR TURBID (A) 12/28/2016 2150   LABSPEC 1.008 12/28/2016 2150   PHURINE 6.0 12/28/2016 2150   GLUCOSEU NEGATIVE 12/28/2016 2150   HGBUR MODERATE (A) 12/28/2016 2150   BILIRUBINUR NEGATIVE 12/28/2016 2150   KETONESUR NEGATIVE 12/28/2016 2150   PROTEINUR 100 (A) 12/28/2016 2150   UROBILINOGEN 0.2 09/18/2010 1120   NITRITE POSITIVE (A) 12/28/2016 2150   LEUKOCYTESUR LARGE (A) 12/28/2016 2150   Sepsis Labs: @LABRCNTIP (procalcitonin:4,lacticidven:4) )No results found for this or any previous visit (from the past 240 hour(s)).   Radiological Exams on Admission: Dg Wrist Complete  Right  Result Date: 12/29/2016 CLINICAL DATA:  Acute onset of right wrist pain and swelling. Initial encounter. EXAM: RIGHT WRIST - COMPLETE 3+ VIEW COMPARISON:  None. FINDINGS: There is no evidence of fracture or dislocation. Mild degenerative change is noted about the distal first metacarpal, with osteophyte formation and subcortical cyst formation. Mild  degenerative change is noted about the first interphalangeal joint. The carpal rows are intact, and demonstrate normal alignment. The joint spaces are preserved. Diffuse vascular calcifications are seen. Diffuse soft tissue swelling is noted about the wrist. IMPRESSION: 1. No evidence of fracture or dislocation. 2. Mild degenerative change about the thumb. 3. Diffuse vascular calcifications seen. Electronically Signed   By: Garald Balding M.D.   On: 12/29/2016 00:25   Dg Chest Portable 1 View  Result Date: 12/28/2016 CLINICAL DATA:  Dyspnea, history of atrial fibrillation, COPD and CHF with hypertension and diabetes. EXAM: PORTABLE CHEST 1 VIEW COMPARISON:  12/26/2016 FINDINGS: The heart is top-normal in size. Patient is slightly rotated to the right on current exam. No aortic aneurysm is identified. Chronic elevation of the right hemidiaphragm versus eventration is noted with adjacent atelectasis. Suggestion of mild interstitial edema given increased interstitial lung markings. No effusion or pneumothorax. No acute nor suspicious osseous abnormalities. Chronic degenerative changes are noted along the [facility] and glenohumeral joints. IMPRESSION: Mild interstitial edema suggested. No pneumonic consolidation. Stable borderline cardiomegaly. Electronically Signed   By: Ashley Royalty M.D.   On: 12/28/2016 20:22    EKG: Independently reviewed. A. fib with RVR.  Assessment/Plan Active Problems:   Obesity hypoventilation syndrome (HCC)   Essential hypertension   ATRIAL FIBRILLATION   CLL (chronic lymphocytic leukemia) (HCC)   Type II diabetes mellitus with  neurological manifestations (HCC)   Pain and swelling of right wrist   Sepsis (HCC)   Acute respiratory failure ([place])    1. Sepsis - likely source could be UTI and possible infection of the right wrist. Patient is placed on empiric antibiotics. Follow blood cultures urine cultures lactate and procalcitonin levels. 2. A. fib with RVR probably precipitated by sepsis - patient's heart rate is improved at this time. Chads 2 vasc score is more than 2 patient on Coumadin which will be on hold for possible procedure for the right wrist. If right wrist procedure is not planned restart Coumadin. Holding Coumadin has been discussed with patient and family were agreeable. Continue beta blockers for rate control. 3. Right wrist swelling and pain - will need hand surgery consult. Check sedimentation rate and uric acid levels and MRI of the right hand. 4. Acute on chronic kidney disease stage III - holding of Lasix for now. Recheck metabolic panel in a.m. Restart Lasix if patient's blood pressure remained stable. 5. Chronic diastolic CHF - holding Lasix for now due to possible sepsis and elevated lactate levels. Restart in a.m. if blood pressure remains stable. 6. Diabetes mellitus type 2 - will hold oral hypoglycemics and continue Lantus with sliding scale coverage. 7. Sleep apnea on BiPAP. 8. History of CLL being followed by oncologist. 9. Chronic thrombocytopenia - follow CBC.   DVT prophylaxis: Presently patient's INR is therapeutic. Code Status: DO NOT RESUSCITATE.  Family Communication: Patient's wife and daughter.  Disposition Plan: Skilled nursing facility.  Consults called: Will need to consult HAND surgery.  Admission status: Inpatient.    Rise Patience MD [facility] Pager [phone].  If 7PM-7AM, please contact night-coverage [email or web address] Password [email or web address]  12/29/2016, 2:16 AM

## 2016-12-29 NOTE — Progress Notes (Signed)
Tech offered Pt a bath.Pt refused and stated not right now.

## 2016-12-29 NOTE — Progress Notes (Signed)
Sacral foam padding placed.

## 2016-12-30 ENCOUNTER — Inpatient Hospital Stay (HOSPITAL_COMMUNITY): Payer: PPO

## 2016-12-30 LAB — CBC WITH DIFFERENTIAL/PLATELET
BASOS ABS: 0 10*3/uL (ref 0.0–0.1)
Basophils Relative: 0 %
Eosinophils Absolute: 0 10*3/uL (ref 0.0–0.7)
Eosinophils Relative: 0 %
HEMATOCRIT: 36.7 % — AB (ref 39.0–52.0)
Hemoglobin: 11.9 g/dL — ABNORMAL LOW (ref 13.0–17.0)
LYMPHS ABS: 7.9 10*3/uL — AB (ref 0.7–4.0)
Lymphocytes Relative: 38 %
MCH: 30.1 pg (ref 26.0–34.0)
MCHC: 32.4 g/dL (ref 30.0–36.0)
MCV: 92.7 fL (ref 78.0–100.0)
MONOS PCT: 8 %
Monocytes Absolute: 1.7 10*3/uL — ABNORMAL HIGH (ref 0.1–1.0)
NEUTROS ABS: 11.3 10*3/uL — AB (ref 1.7–7.7)
Neutrophils Relative %: 54 %
Platelets: 138 10*3/uL — ABNORMAL LOW (ref 150–400)
RBC: 3.96 MIL/uL — ABNORMAL LOW (ref 4.22–5.81)
RDW: 17.9 % — AB (ref 11.5–15.5)
WBC: 20.9 10*3/uL — ABNORMAL HIGH (ref 4.0–10.5)

## 2016-12-30 LAB — COMPREHENSIVE METABOLIC PANEL
ALBUMIN: 2.1 g/dL — AB (ref 3.5–5.0)
ALT: 11 U/L — ABNORMAL LOW (ref 17–63)
ANION GAP: 10 (ref 5–15)
AST: 18 U/L (ref 15–41)
Alkaline Phosphatase: 69 U/L (ref 38–126)
BILIRUBIN TOTAL: 0.6 mg/dL (ref 0.3–1.2)
BUN: 48 mg/dL — ABNORMAL HIGH (ref 6–20)
CO2: 36 mmol/L — ABNORMAL HIGH (ref 22–32)
Calcium: 8.3 mg/dL — ABNORMAL LOW (ref 8.9–10.3)
Chloride: 93 mmol/L — ABNORMAL LOW (ref 101–111)
Creatinine, Ser: 1.99 mg/dL — ABNORMAL HIGH (ref 0.61–1.24)
GFR calc Af Amer: 35 mL/min — ABNORMAL LOW (ref >60)
GFR, EST NON AFRICAN AMERICAN: 30 mL/min — AB (ref >60)
GLUCOSE: 175 mg/dL — AB (ref 65–99)
POTASSIUM: 4 mmol/L (ref 3.5–5.1)
Sodium: 139 mmol/L (ref 135–145)
TOTAL PROTEIN: 6.2 g/dL — AB (ref 6.5–8.1)

## 2016-12-30 LAB — MAGNESIUM: MAGNESIUM: 2.2 mg/dL (ref 1.7–2.4)

## 2016-12-30 LAB — GLUCOSE, CAPILLARY
GLUCOSE-CAPILLARY: 121 mg/dL — AB (ref 65–99)
GLUCOSE-CAPILLARY: 186 mg/dL — AB (ref 65–99)
Glucose-Capillary: 293 mg/dL — ABNORMAL HIGH (ref 65–99)
Glucose-Capillary: 323 mg/dL — ABNORMAL HIGH (ref 65–99)

## 2016-12-30 LAB — PATHOLOGIST SMEAR REVIEW

## 2016-12-30 LAB — PROTIME-INR
INR: 5.73 — AB
PROTHROMBIN TIME: 44.8 s — AB (ref 11.4–15.2)

## 2016-12-30 MED ORDER — TORSEMIDE 20 MG PO TABS
60.0000 mg | ORAL_TABLET | Freq: Two times a day (BID) | ORAL | Status: DC
  Administered 2016-12-31 – 2017-01-03 (×7): 60 mg via ORAL
  Filled 2016-12-30 (×7): qty 3

## 2016-12-30 MED ORDER — CLINDAMYCIN PHOSPHATE 600 MG/50ML IV SOLN
600.0000 mg | Freq: Three times a day (TID) | INTRAVENOUS | Status: DC
  Administered 2016-12-30 – 2017-01-02 (×10): 600 mg via INTRAVENOUS
  Filled 2016-12-30 (×10): qty 50

## 2016-12-30 MED ORDER — PREDNISONE 20 MG PO TABS
40.0000 mg | ORAL_TABLET | Freq: Every day | ORAL | Status: DC
  Administered 2016-12-31 – 2017-01-02 (×3): 40 mg via ORAL
  Filled 2016-12-30 (×3): qty 2

## 2016-12-30 MED ORDER — VITAMIN K1 1 MG/0.5ML IJ SOLN
1.0000 mg | Freq: Once | INTRAMUSCULAR | Status: AC
Start: 2016-12-30 — End: 2016-12-30
  Administered 2016-12-30: 1 mg via SUBCUTANEOUS
  Filled 2016-12-30: qty 0.5

## 2016-12-30 MED ORDER — METOPROLOL SUCCINATE ER 25 MG PO TB24
25.0000 mg | ORAL_TABLET | Freq: Every day | ORAL | Status: DC
  Administered 2016-12-30 – 2017-01-03 (×5): 25 mg via ORAL
  Filled 2016-12-30 (×5): qty 1

## 2016-12-30 NOTE — Progress Notes (Signed)
ANTICOAGULATION CONSULT NOTE - Initial Consult  Pharmacy Consult for Warfarin  Indication: atrial fibrillation  Allergies  Allergen Reactions  . Quinine Palpitations and Other (See Comments)    Seizure     Patient Measurements: Height: 6\' 2"  (188 cm) Weight: 231 lb 3.2 oz (104.9 kg) (bed; pt unable to stand) IBW/kg (Calculated) : 82.2  Vital Signs: Temp: 98.2 F (36.8 C) (04/12 1958) Temp Source: Oral (04/12 1958) BP: 106/70 (04/12 1958) Pulse Rate: 84 (04/12 1958)  Labs:  Recent Labs  12/28/16 1955 12/29/16 0231 12/29/16 0814 12/29/16 1448  HGB 13.8 13.0  --   --   HCT 42.3 40.3  --   --   PLT 148* 143*  --   --   APTT  --  49*  --   --   LABPROT  --  37.4*  --   --   INR  --  3.68  --   --   CREATININE 1.77* 1.80*  --   --   TROPONINI  --  0.05* 0.08* 0.06*    Estimated Creatinine Clearance: 41.6 mL/min (A) (by C-G formula based on SCr of 1.8 mg/dL (H)).   Medical History: Past Medical History:  Diagnosis Date  . Atrial fibrillation ([place])   . Cerebrovascular disease   . CHF (congestive heart failure) ([place])   . CLL (chronic lymphocytic leukemia) ([place]) 11/17/2014  . COPD (chronic obstructive pulmonary disease) ([place])   . History of thrombocytopenia   . Obesity   . Osteoarthritis   . Other and unspecified hyperlipidemia   . Type II or unspecified type diabetes mellitus without mention of complication, not stated as uncontrolled   . Unspecified essential hypertension     Assessment: 83 y/o M on warfarin PTA for afib, warfarin initially held pending hand surgery consult, no plans for surgery, now re-starting warfarin. INR was supra-therapeutic at 3.68 on admit.   Goal of Therapy:  INR 2-3 Monitor platelets by anticoagulation protocol: Yes   Plan:  -Check INR with AM labs to assess dosing needs  Narda Bonds 12/30/2016,12:00 AM

## 2016-12-30 NOTE — Evaluation (Signed)
Occupational Therapy Evaluation Patient Details Name: William Braun MRN: [id number] DOB: 1933/04/29 Today's Date: 12/30/2016    History of Present Illness 83 y.o. male with chronic diastolic CHF, atrial fibrillation, diabetes mellitus, chronic kidney disease, sleep apnea, hypertension was brought to the ER after patient was acutely short of breath since last evening around 4 PM. Has been having nonproductive cough. Denies any chest pain. Patient also has been having persistent right wrist pain since last 24 hours. Denies any fall.  Dx of CHF, sepsis, UTI, possible gout R wrist.   Clinical Impression   Pt admitted with shortness of breath and R wrist pain. Pt currently with functional limitations due to the deficits listed below (see OT Problem List).  Pt will benefit from skilled OT to increase their safety and independence with ADL and functional mobility for ADL to facilitate discharge to venue listed below.      Follow Up Recommendations  SNF    Equipment Recommendations  None recommended by OT       Precautions / Restrictions Precautions Precautions: Fall Restrictions Weight Bearing Restrictions: No      Mobility Bed Mobility Overal bed mobility: Needs Assistance Bed Mobility: Supine to Sit     Supine to sit: Mod assist     General bed mobility comments: pt sitting in chair  Transfers Overall transfer level: Needs assistance Equipment used: Rolling walker (2 wheeled) Transfers: Sit to/from Stand Sit to Stand: Mod assist Stand pivot transfers: Min assist       General transfer comment: VCs hand placement, Min A to rise, min A to pivot, pt on O2 throughout tx    Balance Overall balance assessment: Needs assistance   Sitting balance-Leahy Scale: Good     Standing balance support: Bilateral upper extremity supported Standing balance-Leahy Scale: Poor Standing balance comment: relies on BUE support                           ADL either performed or  assessed with clinical judgement   ADL Overall ADL's : Needs assistance/impaired Eating/Feeding: Moderate assistance Eating/Feeding Details (indicate cue type and reason): pt is R handed but R hand is painful.  Pt tried to use left hand to feed self but pt needed moderate A Grooming: Moderate assistance;Sitting;Cueing for safety;Cueing for sequencing Grooming Details (indicate cue type and reason): pt not able to use R hand due to pain.                               General ADL Comments: OT removed splint on R hand and encouraged to move R wrist and R fingers.   OT propped R arm on pillow.  Wife not present for OT session.     Vision Patient Visual Report: No change from baseline              Pertinent Vitals/Pain Pain Assessment: 0-10 Pain Score: 8  Pain Location: R wrist/fingers  with ROM (pain much less at rest) Pain Descriptors / Indicators: Sore;Discomfort;Grimacing Pain Intervention(s): Monitored during session;Repositioned     Hand Dominance Right   Extremity/Trunk Assessment Upper Extremity Assessment Upper Extremity Assessment: RUE deficits/detail RUE Deficits / Details: pt with R wrist splint on.  OT did remove. Pt able to actively move R wrist and fingers but both are painful.  Encouraged pt to try to move fingers and he did.    Lower Extremity Assessment Lower  Extremity Assessment: LLE deficits/detail LLE Deficits / Details: ankle DF 2/5   Cervical / Trunk Assessment Cervical / Trunk Assessment: Normal   Communication Communication Communication: No difficulties   Cognition Arousal/Alertness: Awake/alert Behavior During Therapy: WFL for tasks assessed/performed Overall Cognitive Status: Impaired/Different from baseline                                 General Comments: able to follow commands, oriented to self, some confusion (repeatedly requested that bedside commode be flushed)   General Comments       Exercises  encouraged  pt to move R fingers as he is able        Home Living Family/patient expects to be discharged to:: Skilled nursing facility                                        Prior Functioning/Environment Level of Independence: Needs assistance  Gait / Transfers Assistance Needed: ambulates short distances with RW, followed by WC at SNF ADL's / Homemaking Assistance Needed: assist            OT Problem List: Decreased strength;Pain;Impaired UE functional use;Decreased knowledge of use of DME or AE      OT Treatment/Interventions: Self-care/ADL training;Therapeutic exercise;Therapeutic activities    OT Goals(Current goals can be found in the care plan section) Acute Rehab OT Goals Patient Stated Goal: decrease pain R wrist OT Goal Formulation: With patient Time For Goal Achievement: 01/13/17 Potential to Achieve Goals: Good  OT Frequency: Min 2X/week   Barriers to D/C:    pt lives in SNF          End of Session Equipment Utilized During Treatment: Surveyor, mining Communication: Mobility status  Activity Tolerance: Patient tolerated treatment well Patient left: in chair;with call bell/phone within reach;with chair alarm set  OT Visit Diagnosis: Pain;Muscle weakness (generalized) (M62.81)                Time: 3500-9381 OT Time Calculation (min): 20 min Charges:  OT Evaluation $OT Eval Moderate Complexity: 1 Procedure G-Codes:     Kari Baars, OT [phone]  Payton Mccallum D 12/30/2016, 2:39 PM

## 2016-12-30 NOTE — Progress Notes (Addendum)
[facility] Progress Note  Patient: William Braun ZOX:[id number]   PCP: Leeroy Cha, MD DOB: April 22, 1933   DOA: 12/28/2016   DOS: 12/30/2016   Date of Service: the patient was seen and examined on 12/30/2016  Subjective: Feeling better, pain has been well-controlled, no nausea or vomiting. No fever no chills.  Brief hospital course: Pt. with PMH of CLL with chronic leukocytosis, chronic CHF, A. fib, DM,CKD , HTN, OSA; admitted on 12/28/2016, with complaint of shortness of breath, was found to have sepsis from cellulitis with acute gout. Currently further plan is continue current management.  Assessment and Plan: 1. Sepsis with cellulitis. History of CLL. Patient presented with fever, tachycardia with shortness of breath requiring BiPAP on admission. Workup shows unremarkable chest x-ray, negative blood culture for 48 hours, UA concerning for possible UTI as well as significant redness and swelling of the right wrist. Started on vancomycin and Zosyn. With negative culture, I would transition him to clindamycin to cover for cellulitis. Also add probiotics.  2. Right wrist gouty arthritis. Severe joint pain, getting better with immobilization with sling 2. MRI head pending. Currently on IV clindamycin. Severely elevated ESR and CRP. Uric acid 12.5 on admission. Has chronic kidney disease and therefore unable to use colchicine or NSAIDs. Placed on oral steroids.  3. A. fib with RVR. Likely secondary to sepsis on admission. On Coumadin. INR supratherapeutic, received vitamin K. We'll monitor. No active bleeding at present.  continue beta blocker for rate control.  4. OSA.  continue C Pap daily at bedtime.  5. Type 2 diabetes mellitus. Steroid-induced hyperglycemia. Continue his Lantus as well as sliding scale coverage.  6. Chronic kidney disease stage III. Chronic diastolic dysfunction Recently outpatient follow up shows pt was given extra dose of metolazone and  torsemide dose was also increased, may have contributed to hyperuricemia. Renal function mildly worsening at present. resume home torsemide tomorrow.  7 chronic thrombocytopenia. Due to CLL. We'll monitor.  Bowel regimen: last BM 12/30/2016 Diet: Cardiac and carb modified diet DVT Prophylaxis: on therapeutic anticoagulation.  Advance goals of care discussion: DNR/DNI  Family Communication: family was present at bedside, at the time of interview. The pt provided permission to discuss medical plan with the family. Opportunity was given to ask question and all questions were answered satisfactorily.   Disposition:  Discharge to SNF. Expected discharge date: 01/01/2017,  Consultants: Hand surgery Procedures: None  Antibiotics: Anti-infectives    Start     Dose/Rate Route Frequency Ordered Stop   12/30/16 1600  clindamycin (CLEOCIN) IVPB 600 mg     600 mg 100 mL/hr over 30 Minutes Intravenous Every 8 hours 12/30/16 1555     12/29/16 2100  vancomycin (VANCOCIN) 1,250 mg in sodium chloride 0.9 % 250 mL IVPB  Status:  Discontinued     1,250 mg 166.7 mL/hr over 90 Minutes Intravenous Every 24 hours 12/28/16 2107 12/30/16 1235   12/29/16 0600  piperacillin-tazobactam (ZOSYN) IVPB 3.375 g  Status:  Discontinued     3.375 g 12.5 mL/hr over 240 Minutes Intravenous Every 8 hours 12/28/16 2107 12/30/16 1554   12/28/16 2115  vancomycin (VANCOCIN) 2,000 mg in sodium chloride 0.9 % 500 mL IVPB     2,000 mg 250 mL/hr over 120 Minutes Intravenous  Once 12/28/16 2102 12/28/16 2345   12/28/16 2100  piperacillin-tazobactam (ZOSYN) IVPB 3.375 g     3.375 g 100 mL/hr over 30 Minutes Intravenous  Once 12/28/16 2058 12/28/16 2243   12/28/16 2100  vancomycin (VANCOCIN)  IVPB 1000 mg/200 mL premix  Status:  Discontinued     1,000 mg 200 mL/hr over 60 Minutes Intravenous  Once 12/28/16 2058 12/29/16 0315       Objective: Physical Exam: Vitals:   12/30/16 0132 12/30/16 0138 12/30/16 0404 12/30/16  1300  BP: 109/68  103/81 121/69  Pulse: 85  88 90  Resp: 20 19 18 20   Temp: 98.6 F (37 C)  98.1 F (36.7 C) 97.5 F (36.4 C)  TempSrc: Axillary  Oral Oral  SpO2: 98% 97% 98% 98%  Weight:   105.4 kg (232 lb 6.4 oz)   Height:        Intake/Output Summary (Last 24 hours) at 12/30/16 1557 Last data filed at 12/30/16 1220  Gross per 24 hour  Intake             1534 ml  Output              500 ml  Net             1034 ml   Filed Weights   12/29/16 0215 12/30/16 0404  Weight: 104.9 kg (231 lb 3.2 oz) 105.4 kg (232 lb 6.4 oz)   General: Alert, Awake and Oriented to Time, Place and Person. Appear in mild distress, affect appropriate Eyes: PERRL, Conjunctiva normal ENT: Oral Mucosa clear moist. Neck: no JVD, no Abnormal Mass Or lumps Cardiovascular: S1 and S2 Present, no Murmur, Respiratory: Bilateral Air entry equal and Decreased, no use of accessory muscle, basal Crackles, no wheezes Abdomen: Bowel Sound present, Soft and no tenderness Skin: right wrist redness, no Rash, no induration Extremities: trace Pedal edema, no calf tenderness Neurologic: Grossly no focal neuro deficit. Bilaterally Equal motor strength  Data Reviewed: CBC:  Recent Labs Lab 12/28/16 1955 12/29/16 0231 12/30/16 0350  WBC 24.6* 21.9* 20.9*  NEUTROABS 11.8* 10.3* 11.3*  HGB 13.8 13.0 11.9*  HCT 42.3 40.3 36.7*  MCV 93.8 94.4 92.7  PLT 148* 143* 532*   Basic Metabolic Panel:  Recent Labs Lab 12/26/16 1449 12/28/16 1955 12/29/16 0231 12/30/16 0350  NA 139 139 142 139  K 4.4 4.7 4.5 4.0  CL 97* 91* 94* 93*  CO2 31 35* 39* 36*  GLUCOSE 195* 234* 168* 175*  BUN 26* 32* 32* 48*  CREATININE 1.52* 1.77* 1.80* 1.99*  CALCIUM 8.8* 9.2 8.8* 8.3*  MG  --   --   --  2.2    Liver Function Tests:  Recent Labs Lab 12/28/16 1955 12/29/16 0231 12/30/16 0350  AST 30 23 18   ALT 16* 14* 11*  ALKPHOS 88 76 69  BILITOT 0.7 1.1 0.6  PROT 7.1 6.5 6.2*  ALBUMIN 2.7* 2.4* 2.1*   No results for  input(s): LIPASE, AMYLASE in the last 168 hours. No results for input(s): AMMONIA in the last 168 hours. Coagulation Profile:  Recent Labs Lab 12/29/16 0231 12/30/16 0350  INR 3.68 5.73*   Cardiac Enzymes:  Recent Labs Lab 12/29/16 0231 12/29/16 0814 12/29/16 1448  TROPONINI 0.05* 0.08* 0.06*   BNP (last 3 results) No results for input(s): PROBNP in the last 8760 hours. CBG:  Recent Labs Lab 12/29/16 1240 12/29/16 1617 12/29/16 2054 12/30/16 0740 12/30/16 1123  GLUCAP 102* 163* 251* 121* 186*   Studies: No results found.  Scheduled Meds: . atorvastatin  10 mg Oral q1800  . chlorhexidine  15 mL Mouth Rinse BID  . clindamycin (CLEOCIN) IV  600 mg Intravenous Q8H  . divalproex  250 mg Oral  BID  . feeding supplement (ENSURE ENLIVE)  237 mL Oral TID PC  . glimepiride  8 mg Oral Q breakfast  . insulin aspart  0-9 Units Subcutaneous TID WC  . insulin glargine  18 Units Subcutaneous Q2200  . mouth rinse  15 mL Mouth Rinse q12n4p  . methylPREDNISolone (SOLU-MEDROL) injection  40 mg Intravenous Daily  . metoprolol succinate  25 mg Oral Daily  . mometasone-formoterol  2 puff Inhalation BID  . multivitamin with minerals  1 tablet Oral Daily  . polyethylene glycol  17 g Oral Daily   Continuous Infusions: PRN Meds: acetaminophen **OR** acetaminophen, antiseptic oral rinse, bisacodyl, HYDROcodone-acetaminophen, ipratropium-albuterol, Melatonin, nitroGLYCERIN, ondansetron **OR** ondansetron (ZOFRAN) IV, senna-docusate  Time spent: 30 minutes  Author: Berle Mull, MD [facility] Pager: [phone] 12/30/2016 3:57 PM  If 7PM-7AM, please contact night-coverage at [email or web address], password [facility]

## 2016-12-30 NOTE — NC FL2 (Signed)
[place] LEVEL OF CARE SCREENING TOOL     IDENTIFICATION  Patient Name: William Braun Birthdate: [birth year] Sex: male Admission Date (Current Location): 12/28/2016  [facility] and [place] Number:  Herbalist and Address:  [facility], [place] [address], [place], [place] [address]      Provider Number: [id number]  Attending Physician Name and Address:  Lavina Hamman, MD  Relative Name and Phone Number:       Current Level of Care: Hospital Recommended Level of Care: [place] Prior Approval Number:    Date Approved/Denied:   PASRR Number: [id number]  Discharge Plan: SNF    Current Diagnoses: Patient Active Problem List   Diagnosis Date Noted  . Sepsis ([place]) 12/29/2016  . Acute respiratory failure ([place]) 12/29/2016  . Malnutrition of moderate degree 12/29/2016  . Pain and swelling of right wrist 12/28/2016  . Chronic kidney disease (CKD), stage III (moderate) 11/17/2016  . Diabetic neuropathy ([place]) 11/17/2016  . Diabetic kidney ([place]) 11/17/2016  . Diabetes mellitus with polyneuropathy ([place]) 11/17/2016  . Acute on chronic diastolic CHF (congestive heart failure) ([place]) 10/31/2016  . Pressure injury of skin 10/30/2016  . Complicated UTI (urinary tract infection) 10/29/2016  . Acute on chronic respiratory failure with hypoxia ([place]) 09/14/2016  . Elevated troponin 09/14/2016  . Hypokalemia 09/14/2016  . Acute renal failure superimposed on stage 3 chronic kidney disease ([place]) 09/14/2016  . Toe ulcer, right ([place]) 02/02/2016  . Type II diabetes mellitus with neurological manifestations ([place]) 02/02/2016  . CLL (chronic lymphocytic leukemia) ([place]) 11/17/2014  . Elevated WBCs 04/23/2014  . Other emphysema ([place]) 09/27/2013  . Dyspnea 08/13/2013  . 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 Chest pain, unspecified 08/03/2011  . Chronic diastolic CHF (congestive heart failure) ([place]) 05/11/2011  . Obesity hypoventilation syndrome  ([place]) 03/12/2010  . CONGESTIVE HEART FAILURE, BIVENTRICULAR DYSFUNCTION 03/12/2010  . COPD exacerbation ([place]) 03/12/2010  . Chronic respiratory failure ([place]) 02/19/2010  . HLD (hyperlipidemia) 12/03/2008  . Morbid obesity ([place]) 12/03/2008  . Essential hypertension 12/03/2008  . ATRIAL FIBRILLATION 12/03/2008  . PERIPHERAL EDEMA 12/03/2008    Orientation RESPIRATION BLADDER Height & Weight     Self, Place  O2, Other (Comment) (Nasal Canula 2 L. CPAP: IPAP (9), EPAP (9).) Incontinent, External catheter Weight: 232 lb 6.4 oz (105.4 kg) Height:  6\' 2"  (188 cm)  BEHAVIORAL SYMPTOMS/MOOD NEUROLOGICAL BOWEL NUTRITION STATUS   (None)  (None) Continent Diet (Heart healthy)  AMBULATORY STATUS COMMUNICATION OF NEEDS Skin   Extensive Assist Verbally Skin abrasions, Other (Comment) (Cellulitis, Skin tear)                       Personal Care Assistance Level of Assistance  Bathing, Feeding, Dressing Bathing Assistance: Limited assistance Feeding assistance: Limited assistance Dressing Assistance: Limited assistance     Functional Limitations Info  Sight, Hearing, Speech Sight Info: Adequate Hearing Info: Adequate Speech Info: Adequate    SPECIAL CARE FACTORS FREQUENCY  PT (By licensed PT), OT (By licensed OT), Blood pressure     PT Frequency: 5 x week OT Frequency: 5 x week            Contractures Contractures Info: Not present    Additional Factors Info  Code Status, Allergies Code Status Info: DNR Allergies Info: Quinine           Current Medications (12/30/2016):  This is the current hospital active medication list Current Facility-Administered Medications  Medication  Dose Route Frequency Provider Last Rate Last Dose  . acetaminophen (TYLENOL) tablet 650 mg  650 mg Oral Q6H PRN Rise Patience, MD       Or  . acetaminophen (TYLENOL) suppository 650 mg  650 mg Rectal Q6H PRN Rise Patience, MD      . antiseptic oral rinse (BIOTENE) solution 15 mL  15 mL  Mouth Rinse Q6H PRN Rise Patience, MD      . atorvastatin (LIPITOR) tablet 10 mg  10 mg Oral q1800 Rise Patience, MD   10 mg at 12/29/16 1618  . bisacodyl (DULCOLAX) suppository 10 mg  10 mg Rectal Daily PRN Rise Patience, MD      . chlorhexidine (PERIDEX) 0.12 % solution 15 mL  15 mL Mouth Rinse BID Rise Patience, MD   15 mL at 12/30/16 1014  . divalproex (DEPAKOTE) DR tablet 250 mg  250 mg Oral BID Rise Patience, MD   250 mg at 12/30/16 1016  . feeding supplement (ENSURE ENLIVE) (ENSURE ENLIVE) liquid 237 mL  237 mL Oral TID PC Lavina Hamman, MD   237 mL at 12/30/16 1219  . glimepiride (AMARYL) tablet 8 mg  8 mg Oral Q breakfast Rise Patience, MD   8 mg at 12/30/16 1016  . HYDROcodone-acetaminophen (NORCO/VICODIN) 5-325 MG per tablet 1 tablet  1 tablet Oral Q6H PRN Rise Patience, MD   1 tablet at 12/30/16 1219  . insulin aspart (novoLOG) injection 0-9 Units  0-9 Units Subcutaneous TID WC Rise Patience, MD   2 Units at 12/30/16 1220  . insulin glargine (LANTUS) injection 18 Units  18 Units Subcutaneous Q2200 Rise Patience, MD   18 Units at 12/29/16 2155  . ipratropium-albuterol (DUONEB) 0.5-2.5 (3) MG/3ML nebulizer solution 3 mL  3 mL Nebulization Q6H PRN Rise Patience, MD      . MEDLINE mouth rinse  15 mL Mouth Rinse q12n4p Rise Patience, MD   15 mL at 12/30/16 1223  . Melatonin TABS 6 mg  6 mg Oral QHS PRN Rise Patience, MD      . methylPREDNISolone sodium succinate (SOLU-MEDROL) 40 mg/mL injection 40 mg  40 mg Intravenous Daily Lavina Hamman, MD   40 mg at 12/30/16 1013  . metoprolol succinate (TOPROL-XL) 24 hr tablet 25 mg  25 mg Oral Daily Lavina Hamman, MD   25 mg at 12/30/16 1016  . mometasone-formoterol (DULERA) 200-5 MCG/ACT inhaler 2 puff  2 puff Inhalation BID Rise Patience, MD      . multivitamin with minerals tablet 1 tablet  1 tablet Oral Daily Lavina Hamman, MD   1 tablet at 12/30/16 1013  .  nitroGLYCERIN (NITROSTAT) SL tablet 0.4 mg  0.4 mg Sublingual Q5 min PRN Rise Patience, MD      . ondansetron [facility]) tablet 4 mg  4 mg Oral Q6H PRN Rise Patience, MD       Or  . ondansetron [facility]) injection 4 mg  4 mg Intravenous Q6H PRN Rise Patience, MD      . piperacillin-tazobactam (ZOSYN) IVPB 3.375 g  3.375 g Intravenous Q8H Otilio Miu, RPH   3.375 g at 12/30/16 0615  . polyethylene glycol (MIRALAX / GLYCOLAX) packet 17 g  17 g Oral Daily Rise Patience, MD   17 g at 12/30/16 1017  . senna-docusate (Senokot-S) tablet 1 tablet  1 tablet Oral QHS PRN Rise Patience,  MD         Discharge Medications: Please see discharge summary for a list of discharge medications.  Relevant Imaging Results:  Relevant Lab Results:   Additional Information SS#: [id number]  Candie Chroman, LCSW

## 2016-12-30 NOTE — Discharge Instructions (Addendum)

## 2016-12-30 NOTE — Evaluation (Signed)
Physical Therapy Evaluation Patient Details Name: William Braun MRN: [id number] DOB: 1932/12/16 Today's Date: 12/30/2016   History of Present Illness  83 y.o. male with chronic diastolic CHF, atrial fibrillation, diabetes mellitus, chronic kidney disease, sleep apnea, hypertension was brought to the ER after patient was acutely short of breath since last evening around 4 PM. Has been having nonproductive cough. Denies any chest pain. Patient also has been having persistent right wrist pain since last 24 hours. Denies any fall.  Dx of CHF, sepsis, UTI, possible gout R wrist.  Clinical Impression  Pt admitted with above diagnosis. Pt currently with functional limitations due to the deficits listed below (see PT Problem List). Mod assist for bed mobility, min assist to pivot to bedside commode and then to recliner. Activity tolerance limited by fatigue. Did not attempt ambulation 2* elevated INR.  Pt will benefit from skilled PT to increase their independence and safety with mobility to allow discharge to the venue listed below.       Follow Up Recommendations SNF;Supervision for mobility/OOB    Equipment Recommendations  None recommended by PT    Recommendations for Other Services OT consult     Precautions / Restrictions Precautions Precautions: Fall Restrictions Weight Bearing Restrictions: No      Mobility  Bed Mobility Overal bed mobility: Needs Assistance Bed Mobility: Supine to Sit     Supine to sit: Mod assist     General bed mobility comments: mod A to raise trunk, pad used to pivot hips to EOB  Transfers Overall transfer level: Needs assistance Equipment used: Rolling walker (2 wheeled) Transfers: Sit to/from [facility] Sit to Stand: Min assist Stand pivot transfers: Min assist       General transfer comment: VCs hand placement, Min A to rise, min A to pivot, pt on O2 throughout tx  Ambulation/Gait             General Gait Details:  deferred 2* elevated INR and pt fatigue with pivot transfer  Stairs            Wheelchair Mobility    Modified Rankin (Stroke Patients Only)       Balance Overall balance assessment: Needs assistance   Sitting balance-Leahy Scale: Good     Standing balance support: Bilateral upper extremity supported Standing balance-Leahy Scale: Poor Standing balance comment: relies on BUE support                             Pertinent Vitals/Pain Pain Assessment: 0-10 Pain Score: 6  Pain Location: R wrist  Pain Descriptors / Indicators: Sore Pain Intervention(s): Limited activity within patient's tolerance;Monitored during session;Patient requesting pain meds-RN notified    Home Living Family/patient expects to be discharged to:: Skilled nursing facility                      Prior Function Level of Independence: Needs assistance   Gait / Transfers Assistance Needed: ambulates short distances with RW, followed by WC at SNF  ADL's / Homemaking Assistance Needed: assist        Hand Dominance   Dominant Hand: Right    Extremity/Trunk Assessment   Upper Extremity Assessment Upper Extremity Assessment: Defer to OT evaluation    Lower Extremity Assessment Lower Extremity Assessment: LLE deficits/detail LLE Deficits / Details: ankle DF 2/5    Cervical / Trunk Assessment Cervical / Trunk Assessment: Normal  Communication   Communication: No  difficulties  Cognition Arousal/Alertness: Awake/alert Behavior During Therapy: WFL for tasks assessed/performed Overall Cognitive Status: Impaired/Different from baseline                                 General Comments: able to follow commands, oriented to self, some confusion (repeatedly requested that bedside commode be flushed)      General Comments      Exercises     Assessment/Plan    PT Assessment Patient needs continued PT services  PT Problem List Decreased activity  tolerance;Decreased strength;Decreased mobility;Pain       PT Treatment Interventions Gait training;DME instruction;Balance training;Functional mobility training;Therapeutic exercise;Therapeutic activities;Patient/family education    PT Goals (Current goals can be found in the Care Plan section)  Acute Rehab PT Goals Patient Stated Goal: decrease pain R wrist PT Goal Formulation: With patient/family Time For Goal Achievement: 01/13/17 Potential to Achieve Goals: Fair    Frequency Min 3X/week   Barriers to discharge        Co-evaluation               End of Session Equipment Utilized During Treatment: Gait belt;Oxygen Activity Tolerance: Patient limited by fatigue Patient left: in chair;with call bell/phone within reach;with family/visitor present;with nursing/sitter in room Nurse Communication: Mobility status PT Visit Diagnosis: Muscle weakness (generalized) (M62.81);Difficulty in walking, not elsewhere classified (R26.2);Pain Pain - Right/Left: Right Pain - part of body: Hand    Time: 9450-3888 PT Time Calculation (min) (ACUTE ONLY): 26 min   Charges:   PT Evaluation $PT Eval Moderate Complexity: 1 Procedure PT Treatments $Therapeutic Activity: 8-22 mins   PT G Codes:         Philomena Doheny 12/30/2016, 1:08 PM [phone]

## 2016-12-30 NOTE — Progress Notes (Signed)
Set pt up on Cpap 9.0 CM H20 with 4LPM bled in oxygen.  Full face mask (pt states he wears at home) tolerating well.  Will continue to monitor.

## 2016-12-30 NOTE — Progress Notes (Signed)
Pt INR 5.73, MD notified, will to continue to monitor, Thanks Arvella Nigh RN

## 2016-12-30 NOTE — Progress Notes (Signed)
[place] for Warfarin  Indication: atrial fibrillation  Allergies  Allergen Reactions  . Quinine Palpitations and Other (See Comments)    Seizure    Labs:  Recent Labs  12/28/16 1955 12/29/16 0231 12/29/16 0814 12/29/16 1448 12/30/16 0350  HGB 13.8 13.0  --   --  11.9*  HCT 42.3 40.3  --   --  36.7*  PLT 148* 143*  --   --  138*  APTT  --  49*  --   --   --   LABPROT  --  37.4*  --   --  44.8*  INR  --  3.68  --   --  5.73*  CREATININE 1.77* 1.80*  --   --  1.99*  TROPONINI  --  0.05* 0.08* 0.06*  --     Estimated Creatinine Clearance: 37.7 mL/min (A) (by C-G formula based on SCr of 1.99 mg/dL (H)).   Assessment: 83 y/o M on warfarin PTA for afib, warfarin initially held pending hand surgery consult, no plans for surgery, now re-starting warfarin. INR was supra-therapeutic at 3.68 on admit.   INR still supra-therapeutic, small dose of vitamin K given  Goal of Therapy:  INR 2-3 Monitor platelets by anticoagulation protocol: Yes   Plan:  No warfarin today Daily INR  Thank you Anette Guarneri, PharmD [phone]  12/30/2016,9:46 AM

## 2016-12-30 NOTE — Progress Notes (Signed)
Subjective: Right wrist feels improved today.  Still sore.   Objective: Vital signs in last 24 hours: Temp:  [97.5 F (36.4 C)-98.6 F (37 C)] 97.5 F (36.4 C) (04/13 1300) Pulse Rate:  [84-90] 90 (04/13 1300) Resp:  [18-20] 20 (04/13 1300) BP: (103-121)/(68-81) 121/69 (04/13 1300) SpO2:  [94 %-98 %] 98 % (04/13 1300) Weight:  [105.4 kg (232 lb 6.4 oz)] 105.4 kg (232 lb 6.4 oz) (04/13 0404)  Intake/Output from previous day: 04/12 0701 - 04/13 0700 In: 1142 [P.O.:692; IV Piggyback:450] Out: 391 [Urine:875] Intake/Output this shift: Total I/O In: 889 [P.O.:889] Out: -    Recent Labs  12/28/16 1955 12/29/16 0231 12/30/16 0350  HGB 13.8 13.0 11.9*    Recent Labs  12/29/16 0231 12/30/16 0350  WBC 21.9* 20.9*  RBC 4.27 3.96*  HCT 40.3 36.7*  PLT 143* 138*    Recent Labs  12/29/16 0231 12/30/16 0350  NA 142 139  K 4.5 4.0  CL 94* 93*  CO2 39* 36*  BUN 32* 48*  CREATININE 1.80* 1.99*  GLUCOSE 168* 175*  CALCIUM 8.8* 8.3*    Recent Labs  12/29/16 0231 12/30/16 0350  INR 3.68 5.73*    intact sensation and capillary refill.  swelling at wrist.  motion much improved.    Assessment/Plan: Right wrist gouty attack.  Seems to be improving.  Continue medical management.  Recommend moving wrist bands out from under splint.   Ario Mcdiarmid R 12/30/2016, 3:58 PM

## 2016-12-30 NOTE — Consult Note (Signed)
   [facility] Inpatient Consult   12/30/2016  William Braun May 06, 1933 [id number]    Patient evaluated for multiple admissions, 3 hospitalizations in the past 3 months noted.  Patient listed in the [place]. Chart review reveals the patient is William Braun is a 83 y.o. male with chronic diastolic CHF, atrial fibrillation, diabetes mellitus, chronic kidney disease, sleep apnea, hypertension was brought to the ER after patient was acutely short of breath since last evening around 4 PM. Has been having nonproductive cough. Denies any chest pain. Patient also has been having persistent right wrist pain since last 24 hours. Denies any fall.  Patient was admitted from [facility] facility and plans are to return. No current [facility] Care Management needs noted for discharge at this time.  Came by to speak with patient and no family at the bedside.  Patient was sound asleep but easily awaken.  Patient states not feeling well today.  Nurse states that his wife called and is on her way to the hospital.  A brochure with contact information was left at bedside with the 24 hour magnet nurse advise line.  For questions, please contact:  Natividad Brood, RN BSN [facility] Liaison  [phone] business mobile phone Toll free office [phone]

## 2016-12-30 NOTE — Progress Notes (Signed)
[]  Hide copied text [] Hover for attribution information Set pt up on Cpap 9.0 CM H20 with 4LPM bled in oxygen.  Full face mask  tolerating well.  Will continue to monitor.

## 2016-12-31 DIAGNOSIS — I482 Chronic atrial fibrillation: Secondary | ICD-10-CM

## 2016-12-31 DIAGNOSIS — E662 Morbid (severe) obesity with alveolar hypoventilation: Secondary | ICD-10-CM

## 2016-12-31 DIAGNOSIS — C9190 Lymphoid leukemia, unspecified not having achieved remission: Secondary | ICD-10-CM

## 2016-12-31 DIAGNOSIS — R52 Pain, unspecified: Secondary | ICD-10-CM

## 2016-12-31 DIAGNOSIS — I10 Essential (primary) hypertension: Secondary | ICD-10-CM

## 2016-12-31 LAB — COMPREHENSIVE METABOLIC PANEL
ALT: 14 U/L — AB (ref 17–63)
AST: 19 U/L (ref 15–41)
Albumin: 2 g/dL — ABNORMAL LOW (ref 3.5–5.0)
Alkaline Phosphatase: 79 U/L (ref 38–126)
Anion gap: 11 (ref 5–15)
BILIRUBIN TOTAL: 0.5 mg/dL (ref 0.3–1.2)
BUN: 60 mg/dL — ABNORMAL HIGH (ref 6–20)
CO2: 36 mmol/L — AB (ref 22–32)
CREATININE: 1.66 mg/dL — AB (ref 0.61–1.24)
Calcium: 8.5 mg/dL — ABNORMAL LOW (ref 8.9–10.3)
Chloride: 88 mmol/L — ABNORMAL LOW (ref 101–111)
GFR calc non Af Amer: 37 mL/min — ABNORMAL LOW (ref >60)
GFR, EST AFRICAN AMERICAN: 43 mL/min — AB (ref >60)
Glucose, Bld: 276 mg/dL — ABNORMAL HIGH (ref 65–99)
Potassium: 4 mmol/L (ref 3.5–5.1)
SODIUM: 135 mmol/L (ref 135–145)
TOTAL PROTEIN: 6.4 g/dL — AB (ref 6.5–8.1)

## 2016-12-31 LAB — CBC WITH DIFFERENTIAL/PLATELET
BASOS PCT: 0 %
Basophils Absolute: 0 10*3/uL (ref 0.0–0.1)
EOS ABS: 0 10*3/uL (ref 0.0–0.7)
EOS PCT: 0 %
HEMATOCRIT: 37 % — AB (ref 39.0–52.0)
HEMOGLOBIN: 12.2 g/dL — AB (ref 13.0–17.0)
LYMPHS PCT: 36 %
Lymphs Abs: 8.6 10*3/uL — ABNORMAL HIGH (ref 0.7–4.0)
MCH: 30.3 pg (ref 26.0–34.0)
MCHC: 33 g/dL (ref 30.0–36.0)
MCV: 92 fL (ref 78.0–100.0)
MONOS PCT: 5 %
Monocytes Absolute: 1.2 10*3/uL — ABNORMAL HIGH (ref 0.1–1.0)
NEUTROS PCT: 59 %
Neutro Abs: 14 10*3/uL — ABNORMAL HIGH (ref 1.7–7.7)
Platelets: 172 10*3/uL (ref 150–400)
RBC: 4.02 MIL/uL — ABNORMAL LOW (ref 4.22–5.81)
RDW: 17.7 % — ABNORMAL HIGH (ref 11.5–15.5)
WBC: 23.8 10*3/uL — ABNORMAL HIGH (ref 4.0–10.5)

## 2016-12-31 LAB — GLUCOSE, CAPILLARY
GLUCOSE-CAPILLARY: 191 mg/dL — AB (ref 65–99)
GLUCOSE-CAPILLARY: 286 mg/dL — AB (ref 65–99)
Glucose-Capillary: 170 mg/dL — ABNORMAL HIGH (ref 65–99)
Glucose-Capillary: 330 mg/dL — ABNORMAL HIGH (ref 65–99)

## 2016-12-31 LAB — PROTIME-INR
INR: 4.94
Prothrombin Time: 47.4 seconds — ABNORMAL HIGH (ref 11.4–15.2)

## 2016-12-31 LAB — MAGNESIUM: Magnesium: 2.6 mg/dL — ABNORMAL HIGH (ref 1.7–2.4)

## 2016-12-31 NOTE — Progress Notes (Addendum)
[name]  NWG:[id number] DOB: 09/26/1932 DOA: 12/28/2016 PCP: Leeroy Cha, MD   Chief Complaint  Patient presents with  . Shortness of Breath    Brief Narrative:  Pt. with PMH of CLL with chronic leukocytosis, chronic CHF, A. fib, DM,CKD , HTN, OSA; admitted on 12/28/2016, with complaint of shortness of breath, was found to have sepsis from cellulitis with acute gout. Currently further plan is continue current management. Assessment & Plan   Sepsis with cellulitis -History of CLL -Patient presented with fever, tachycardia with shortness of breath requiring BiPAP on admission. -Workup shows unremarkable chest x-ray, blood cultures negative to date, UA concerning for possible UTI as well as significant redness and swelling of the right wrist. -No urine culture done, no complaints of dysuria -Started on vancomycin and Zosyn, and transitioned to clindamycin -Continue probiotics  Right wrist gouty arthritis -Severe joint pain, getting better with immobilization with sling 2. -MRI hand: Soft tissue and intramuscular signal abnormalities and edema consistent with soft tissue edema/cellulitis in or my ascites. No abscess identified. Probable tear of the triangular fibrocartilage -Currently on IV clindamycin. -Severely elevated ESR and CRP. -Uric acid 12.5 on admission. -Has chronic kidney disease and therefore unable to use colchicine or NSAIDs. -Placed on oral steroids. -Hand/Orthopedics, Dr. Fredna Dow, consulted appreciated-recommended moving wrist bands from under splint -PT, OT consulted and recommended SNF  A. fib with RVR -Likely secondary to sepsis on admission. -On Coumadin. -INR supratherapeutic, received vitamin K. -Continue metoprolol  OSA -continue C Pap daily at bedtime  Type 2 diabetes mellitus -Steroid-induced hyperglycemia. -Continue Amaryl, Lantus as well as sliding scale coverage and CBG monitoring   Chronic kidney disease stage  III -Creatinine currently 1.66, continue to monitor BMP -Of note patient was given dose of metolazone and furosemide as an outpatient which may have contributed to his hyper uricemia  Chronic diastolic dysfunction -Will continue torsemide -Monitor intake and output, daily weights  Chronic thrombocytopenia/ CLL -Continue to monitor CBC  Moderate malnutrition -Nutrition consulted appreciated, continue supplements  DVT Prophylaxis  supratherapeutic INR  Code Status: DO NOT RESUSCITATE  Family Communication: None at bedside  Disposition Plan: Admitted, pending improvement in patient's pain. Possible discharge to SNF on 01/01/2017  Consultants Hand surgery  Procedures  None  Antibiotics   Anti-infectives    Start     Dose/Rate Route Frequency Ordered Stop   12/30/16 1600  clindamycin (CLEOCIN) IVPB 600 mg     600 mg 100 mL/hr over 30 Minutes Intravenous Every 8 hours 12/30/16 1555     12/29/16 2100  vancomycin (VANCOCIN) 1,250 mg in sodium chloride 0.9 % 250 mL IVPB  Status:  Discontinued     1,250 mg 166.7 mL/hr over 90 Minutes Intravenous Every 24 hours 12/28/16 2107 12/30/16 1235   12/29/16 0600  piperacillin-tazobactam (ZOSYN) IVPB 3.375 g  Status:  Discontinued     3.375 g 12.5 mL/hr over 240 Minutes Intravenous Every 8 hours 12/28/16 2107 12/30/16 1554   12/28/16 2115  vancomycin (VANCOCIN) 2,000 mg in sodium chloride 0.9 % 500 mL IVPB     2,000 mg 250 mL/hr over 120 Minutes Intravenous  Once 12/28/16 2102 12/28/16 2345   12/28/16 2100  piperacillin-tazobactam (ZOSYN) IVPB 3.375 g     3.375 g 100 mL/hr over 30 Minutes Intravenous  Once 12/28/16 2058 12/28/16 2243   12/28/16 2100  vancomycin (VANCOCIN) IVPB 1000 mg/200 mL premix  Status:  Discontinued     1,000 mg 200 mL/hr over 60  Minutes Intravenous  Once 12/28/16 2058 12/29/16 0315      Subjective:   William Braun seen and examined today.  Patient continues complain of pain and not feeling well. Denies any chest  pain, shortness of breath, abdominal pain, nausea or vomiting. In general states he just is tired.  Objective:   Vitals:   12/30/16 1921 12/30/16 1926 12/30/16 1929 12/31/16 0542  BP: 111/69   129/82  Pulse: 87  85 88  Resp: _0 Temp: 97.6 F (36.4 C)   97.7 F (36.5 C)  TempSrc: Oral   Oral  SpO2: 98% 99% 99% 99%  Weight:    104.6 kg (230 lb 9.6 oz)  Height:        Intake/Output Summary (Last 24 hours) at 12/31/16 1024 Last data filed at 12/31/16 0741  Gross per 24 hour  Intake             1464 ml  Output             1750 ml  Net             -286 ml   Filed Weights   12/29/16 0215 12/30/16 0404 12/31/16 0542  Weight: 104.9 kg (231 lb 3.2 oz) 105.4 kg (232 lb 6.4 oz) 104.6 kg (230 lb 9.6 oz)    Exam  General: Well developed, well nourished, NAD, appears stated age  45: NCAT,mucous membranes moist.   Cardiovascular: S1 S2 auscultated,Irregular  Respiratory: Diminished breath sounds however clear  Abdomen: Soft, nontender, nondistended, + bowel sounds  Extremities: warm dry without cyanosis clubbing. Trace pedal edema  Neuro: AAOx3, nonfocal, right hand and splint  Skin: Without rashes exudates or nodules  Psych: depressed   Data Reviewed: I have personally reviewed following labs and imaging studies  CBC:  Recent Labs Lab 12/28/16 1955 12/29/16 0231 12/30/16 0350 12/31/16 0322  WBC 24.6* 21.9* 20.9* 23.8*  NEUTROABS 11.8* 10.3* 11.3* 14.0*  HGB 13.8 13.0 11.9* 12.2*  HCT 42.3 40.3 36.7* 37.0*  MCV 93.8 94.4 92.7 92.0  PLT 148* 143* 138* 270   Basic Metabolic Panel:  Recent Labs Lab 12/26/16 1449 12/28/16 1955 12/29/16 0231 12/30/16 0350 12/31/16 0322  NA 139 139 142 139 135  K 4.4 4.7 4.5 4.0 4.0  CL 97* 91* 94* 93* 88*  CO2 31 35* 39* 36* 36*  GLUCOSE 195* 234* 168* 175* 276*  BUN 26* 32* 32* 48* 60*  CREATININE 1.52* 1.77* 1.80* 1.99* 1.66*  CALCIUM 8.8* 9.2 8.8* 8.3* 8.5*  MG  --   --   --  2.2 2.6*   GFR: Estimated  Creatinine Clearance: 45 mL/min (A) (by C-G formula based on SCr of 1.66 mg/dL (H)). Liver Function Tests:  Recent Labs Lab 12/28/16 1955 12/29/16 0231 12/30/16 0350 12/31/16 0322  AST _1 ALT 16* 14* 11* 14*  ALKPHOS 88 76 69 79  BILITOT 0.7 1.1 0.6 0.5  PROT 7.1 6.5 6.2* 6.4*  ALBUMIN 2.7* 2.4* 2.1* 2.0*   No results for input(s): LIPASE, AMYLASE in the last 168 hours. No results for input(s): AMMONIA in the last 168 hours. Coagulation Profile:  Recent Labs Lab 12/29/16 0231 12/30/16 0350 12/31/16 0322  INR 3.68 5.73* 4.94*   Cardiac Enzymes:  Recent Labs Lab 12/29/16 0231 12/29/16 0814 12/29/16 1448  TROPONINI 0.05* 0.08* 0.06*   BNP (last 3 results) No results for input(s): PROBNP in the last 8760 hours. HbA1C: No results for input(s): HGBA1C in  the last 72 hours. CBG:  Recent Labs Lab 12/30/16 0740 12/30/16 1123 12/30/16 1648 12/30/16 2057 12/31/16 0736  GLUCAP 121* 186* 293* 323* 170*   Lipid Profile: No results for input(s): CHOL, HDL, LDLCALC, TRIG, CHOLHDL, LDLDIRECT in the last 72 hours. Thyroid Function Tests: No results for input(s): TSH, T4TOTAL, FREET4, T3FREE, THYROIDAB in the last 72 hours. Anemia Panel: No results for input(s): VITAMINB12, FOLATE, FERRITIN, TIBC, IRON, RETICCTPCT in the last 72 hours. Urine analysis:    Component Value Date/Time   COLORURINE AMBER (A) 12/28/2016 2150   APPEARANCEUR TURBID (A) 12/28/2016 2150   LABSPEC 1.008 12/28/2016 2150   PHURINE 6.0 12/28/2016 2150   GLUCOSEU NEGATIVE 12/28/2016 2150   HGBUR MODERATE (A) 12/28/2016 2150   BILIRUBINUR NEGATIVE 12/28/2016 2150   KETONESUR NEGATIVE 12/28/2016 2150   PROTEINUR 100 (A) 12/28/2016 2150   UROBILINOGEN 0.2 09/18/2010 1120   NITRITE POSITIVE (A) 12/28/2016 2150   LEUKOCYTESUR LARGE (A) 12/28/2016 2150   Sepsis Labs: _0 (procalcitonin:4,lacticidven:4)  ) Recent Results (from the past 240 hour(s))  Culture, blood (routine x 2)      Status: None (Preliminary result)   Collection Time: 12/28/16  7:55 PM  Result Value Ref Range Status   Specimen Description BLOOD LEFT FOREARM  Final   Special Requests   Final    BOTTLES DRAWN AEROBIC AND ANAEROBIC Blood Culture adequate volume   Culture NO GROWTH 2 DAYS  Final   Report Status PENDING  Incomplete  Culture, blood (routine x 2)     Status: None (Preliminary result)   Collection Time: 12/28/16  8:25 PM  Result Value Ref Range Status   Specimen Description BLOOD RIGHT HAND  Final   Special Requests   Final    BOTTLES DRAWN AEROBIC AND ANAEROBIC Blood Culture adequate volume   Culture NO GROWTH 2 DAYS  Final   Report Status PENDING  Incomplete  MRSA PCR Screening     Status: None   Collection Time: 12/29/16  5:28 AM  Result Value Ref Range Status   MRSA by PCR NEGATIVE NEGATIVE Final    Comment:        The GeneXpert MRSA Assay (FDA approved for NASAL specimens only), is one component of a comprehensive MRSA colonization surveillance program. It is not intended to diagnose MRSA infection nor to guide or monitor treatment for MRSA infections.       Radiology Studies: Mr Hand Right Wo Contrast  Result Date: 12/31/2016 CLINICAL DATA:  Diffuse swelling and pain of the right wrist EXAM: MRI OF THE RIGHT HAND WITHOUT CONTRAST TECHNIQUE: Multiplanar, multisequence MR imaging of the right wrist was performed. No intravenous contrast was administered. COMPARISON:  Radiographs from 12/29/2016 FINDINGS: Technologist notes that the patient had a difficult time breathing during the exam. Only a limited amount of imaging was acquired before study had to be terminated with axial T1, axial T2 fat sat and STIR images of the wrist and metacarpals acquired. There is diffuse soft tissue edema of the visualized hand and wrist consistent with cellulitis. No focal abscess collection is noted. Intramuscular edema is also noted which may represent a diffuse myositis. Subcortical degenerative  cystic changes of the distal radius and ulna at the distal radioulnar joint, intercarpal, heads of the first and second metacarpals and head of first proximal phalanx are noted. No acute fracture or dislocations. No evidence of pyomyositis. Small amount of fluid in the distal radioulnar joint likely represent a tear through the triangular fibrocartilage complex. No abnormal signal abnormalities  of the extensor and flexor tendons crossing the wrist. The flexor retinaculum is not thickened in appearance. IMPRESSION: Soft tissue and intramuscular signal abnormalities and edema consistent with soft tissue edema/cellulitis and/or myositis. No focal abscess identified. Osteoarthritic sub cortical cystic change as above. No signal abnormalities of the flexor nor extensor tendons crossing the wrist. Probable tear of the triangular fibrocartilage complex given fluid in the distal radioulnar joint. Electronically Signed   By: Ashley Royalty M.D.   On: 12/31/2016 02:09     Scheduled Meds: . atorvastatin  10 mg Oral q1800  . chlorhexidine  15 mL Mouth Rinse BID  . clindamycin (CLEOCIN) IV  600 mg Intravenous Q8H  . divalproex  250 mg Oral BID  . feeding supplement (ENSURE ENLIVE)  237 mL Oral TID PC  . glimepiride  8 mg Oral Q breakfast  . insulin aspart  0-9 Units Subcutaneous TID WC  . insulin glargine  18 Units Subcutaneous Q2200  . mouth rinse  15 mL Mouth Rinse q12n4p  . metoprolol succinate  25 mg Oral Daily  . mometasone-formoterol  2 puff Inhalation BID  . multivitamin with minerals  1 tablet Oral Daily  . polyethylene glycol  17 g Oral Daily  . predniSONE  40 mg Oral Q breakfast  . torsemide  60 mg Oral BID   Continuous Infusions:   LOS: 2 days   Time Spent in minutes   30 minutes  Evren Shankland D.O. on 12/31/2016 at 10:24 AM  Between 7am to 7pm - Pager - [phone]  After 7pm go to [email or web address] - password [email or web address]  And look for the night coverage person covering for me after  hours  [facility] Office  [phone]

## 2016-12-31 NOTE — Progress Notes (Signed)
[place] for Warfarin  Indication: atrial fibrillation  Assessment: 83 y/o M on warfarin PTA for afib, warfarin initially held pending hand surgery consult, no plans for surgery, warfarin restarted. INR was supra-therapeutic at 3.68 on admit.   INR trending down today to 4.94 (supratherapeutic). Vitamin K 1 mg po given 4/13. Hemoglobin and platelets stable. Appetite improving and antibiotics have been discontinued - which may have contributed to supratherapeutic levels.   Goal of Therapy:  INR 2-3 Monitor platelets by anticoagulation protocol: Yes   Plan:  Hold warfarin today Daily INR   Allergies  Allergen Reactions  . Quinine Palpitations and Other (See Comments)    Seizure    Labs:  Recent Labs  12/29/16 0231 12/29/16 0814 12/29/16 1448 12/30/16 0350 12/31/16 0322  HGB 13.0  --   --  11.9* 12.2*  HCT 40.3  --   --  36.7* 37.0*  PLT 143*  --   --  138* 172  APTT 49*  --   --   --   --   LABPROT 37.4*  --   --  44.8* 47.4*  INR 3.68  --   --  5.73* 4.94*  CREATININE 1.80*  --   --  1.99* 1.66*  TROPONINI 0.05* 0.08* 0.06*  --   --     Estimated Creatinine Clearance: 45 mL/min (A) (by C-G formula based on SCr of 1.66 mg/dL (H)).  Belia Heman, PharmD PGY1 Pharmacy Resident [phone] (Pager) 12/31/2016 11:50 AM

## 2016-12-31 NOTE — Progress Notes (Signed)
Patient was on CPAP when RT entered room.  RT connected the O2 tubing with 3.5 L bleed in. Patient is tolerating at this time.    12/31/16 2151  BiPAP/CPAP/SIPAP  BiPAP/CPAP/SIPAP Pt Type Adult  Mask Type Full face mask  Mask Size Large  Respiratory Rate 20 breaths/min  EPAP 9 cmH2O  Flow Rate 3.5 lpm  BiPAP/CPAP/SIPAP CPAP  Patient Home Equipment No  Auto Titrate No  BiPAP/CPAP /SiPAP Vitals  Resp 20  Bilateral Breath Sounds Clear;Diminished

## 2016-12-31 NOTE — Progress Notes (Addendum)
Pt was getting out of bed with help and up to bed to chair with assistance, eating well, vitals stable, No any complications arises during AM shift, will continue to monitor

## 2016-12-31 NOTE — Progress Notes (Signed)
Call from lab- INR 4.94

## 2017-01-01 DIAGNOSIS — E440 Moderate protein-calorie malnutrition: Secondary | ICD-10-CM

## 2017-01-01 DIAGNOSIS — N3001 Acute cystitis with hematuria: Secondary | ICD-10-CM

## 2017-01-01 DIAGNOSIS — R609 Edema, unspecified: Secondary | ICD-10-CM

## 2017-01-01 DIAGNOSIS — R0600 Dyspnea, unspecified: Secondary | ICD-10-CM

## 2017-01-01 LAB — BASIC METABOLIC PANEL
ANION GAP: 10 (ref 5–15)
BUN: 65 mg/dL — AB (ref 6–20)
CALCIUM: 8.6 mg/dL — AB (ref 8.9–10.3)
CO2: 41 mmol/L — AB (ref 22–32)
CREATININE: 1.37 mg/dL — AB (ref 0.61–1.24)
Chloride: 88 mmol/L — ABNORMAL LOW (ref 101–111)
GFR calc Af Amer: 54 mL/min — ABNORMAL LOW (ref >60)
GFR calc non Af Amer: 47 mL/min — ABNORMAL LOW (ref >60)
GLUCOSE: 164 mg/dL — AB (ref 65–99)
Potassium: 3.3 mmol/L — ABNORMAL LOW (ref 3.5–5.1)
Sodium: 139 mmol/L (ref 135–145)

## 2017-01-01 LAB — GLUCOSE, CAPILLARY
GLUCOSE-CAPILLARY: 332 mg/dL — AB (ref 65–99)
Glucose-Capillary: 138 mg/dL — ABNORMAL HIGH (ref 65–99)
Glucose-Capillary: 180 mg/dL — ABNORMAL HIGH (ref 65–99)
Glucose-Capillary: 237 mg/dL — ABNORMAL HIGH (ref 65–99)

## 2017-01-01 LAB — CBC
HEMATOCRIT: 39.3 % (ref 39.0–52.0)
Hemoglobin: 12.9 g/dL — ABNORMAL LOW (ref 13.0–17.0)
MCH: 30.2 pg (ref 26.0–34.0)
MCHC: 32.8 g/dL (ref 30.0–36.0)
MCV: 92 fL (ref 78.0–100.0)
PLATELETS: 199 10*3/uL (ref 150–400)
RBC: 4.27 MIL/uL (ref 4.22–5.81)
RDW: 17.4 % — AB (ref 11.5–15.5)
WBC: 21.7 10*3/uL — ABNORMAL HIGH (ref 4.0–10.5)

## 2017-01-01 LAB — PROTIME-INR
INR: 3.95
Prothrombin Time: 39.6 seconds — ABNORMAL HIGH (ref 11.4–15.2)

## 2017-01-01 MED ORDER — POTASSIUM CHLORIDE CRYS ER 20 MEQ PO TBCR
40.0000 meq | EXTENDED_RELEASE_TABLET | Freq: Once | ORAL | Status: AC
  Administered 2017-01-01: 40 meq via ORAL
  Filled 2017-01-01: qty 2

## 2017-01-01 MED ORDER — WARFARIN SODIUM 1 MG PO TABS
1.0000 mg | ORAL_TABLET | Freq: Once | ORAL | Status: AC
  Administered 2017-01-01: 1 mg via ORAL
  Filled 2017-01-01: qty 0.5

## 2017-01-01 MED ORDER — WARFARIN - PHARMACIST DOSING INPATIENT
Freq: Every day | Status: DC
  Administered 2017-01-01: 18:00:00

## 2017-01-01 NOTE — Progress Notes (Signed)
Pt was up to the chair for couple of hours, tolerating well, worked with OT also, vitals stable, no any specific complain of chest pain or distress, will continue to monitor the patient

## 2017-01-01 NOTE — Progress Notes (Signed)
Checked on patient's CPAP, he had reported to RN that something "didn't feel right".  Machine operating as intended, no loose connections or disconnections.  Assured patient that machine is working properly, patient agreed to wear again.  Patient stated "it feels good" after reapplication.

## 2017-01-01 NOTE — Progress Notes (Signed)
Occupational Therapy Treatment Patient Details Name: William Braun MRN: [id number] DOB: [birth year] Today's Date: 01/01/2017    History of present illness 83 y.o. male with chronic diastolic CHF, atrial fibrillation, diabetes mellitus, chronic kidney disease, sleep apnea, hypertension was brought to the ER after patient was acutely short of breath since last evening around 4 PM. Has been having nonproductive cough. Denies any chest pain. Patient also has been having persistent right wrist pain since last 24 hours. Denies any fall.  Dx of CHF, sepsis, UTI, possible gout R wrist.   OT comments  Pt demonstrates progress towards goals and increased AROM of R hand. Provided pt with PROM and AROM stretching and exercise for R hand. Pt performed grooming tasks in recliner and used his R hand to wash his face and brush his hair. Will continue to follow acutely. Continue to recommend dc to SNF for further OT.   Follow Up Recommendations  SNF    Equipment Recommendations  None recommended by OT    Recommendations for Other Services      Precautions / Restrictions Precautions Precautions: Fall       Mobility Bed Mobility               General bed mobility comments: In recliner upon arrival  Transfers                      Balance                                           ADL either performed or assessed with clinical judgement   ADL       Grooming: Wash/dry face;Brushing hair;Set up;Supervision/safety;Sitting (In recliner. Had pt perform strengthing of R hand by squeezing water from wash cloth) Grooming Details (indicate cue type and reason): Pt used R hand to perform grooming                               General ADL Comments: Checked splint wear and educated pt on position     Vision       Perception     Praxis      Cognition Arousal/Alertness: Awake/alert Behavior During Therapy: WFL for tasks assessed/performed Overall  Cognitive Status: Within Functional Limits for tasks assessed                                          Exercises Hand Exercises Wrist Flexion: AROM;PROM;5 reps;Right;Seated Wrist Extension: PROM;AROM;Right;5 reps;Seated Wrist Ulnar Deviation: PROM;Right;5 reps;Seated Wrist Radial Deviation: PROM;Right;5 reps;Seated Digit Composite Flexion: AROM;Right;5 reps;Seated Composite Extension: AROM;Right;5 reps;Seated Digit Composite Abduction: AROM;PROM;Right;5 reps;Seated Digit Composite Adduction: AROM;Right;5 reps;Seated Opposition: AROM;Right;Seated   Shoulder Instructions       General Comments      Pertinent Vitals/ Pain       Pain Assessment: 0-10 Pain Score: 5  Pain Location: R wrist/fingers  with ROM (pain much less at rest) Pain Descriptors / Indicators: Sore;Discomfort;Grimacing Pain Intervention(s): Monitored during session  Home Living  Prior Functioning/Environment              Frequency  Min 2X/week        Progress Toward Goals  OT Goals(current goals can now be found in the care plan section)  Progress towards OT goals: Progressing toward goals  Acute Rehab OT Goals Patient Stated Goal: decrease pain R wrist OT Goal Formulation: With patient Time For Goal Achievement: 01/13/17 Potential to Achieve Goals: Good ADL Goals Pt Will Perform Grooming: with set-up;sitting Pt Will Transfer to Toilet: with set-up;with min assist;bedside commode Pt Will Perform Toileting - Clothing Manipulation and hygiene: with mod assist Pt/caregiver will Perform Home Exercise Program: Right Upper extremity;With minimal assist;With written HEP provided  Plan Discharge plan remains appropriate    Co-evaluation                 End of Session    OT Visit Diagnosis: Pain;Muscle weakness (generalized) (M62.81) Pain - Right/Left: Right Pain - part of body: Hand   Activity Tolerance Patient  tolerated treatment well   Patient Left in chair;with call bell/phone within reach   Nurse Communication Mobility status        Time: 8756-4332 OT Time Calculation (min): 12 min  Charges: OT General Charges $OT Visit: 1 Procedure OT Treatments $Therapeutic Activity: 8-22 mins  [place], OTR/L [phone]   [place] 01/01/2017, 5:25 PM

## 2017-01-01 NOTE — Progress Notes (Signed)
PROGRESS NOTE    William Braun  VWU:[id number] DOB: [birth year] DOA: 12/28/2016 PCP: Leeroy Cha, MD   Chief Complaint  Patient presents with  . Shortness of Breath    Brief Narrative:  Pt. with PMH of CLL with chronic leukocytosis, chronic CHF, A. fib, DM,CKD , HTN, OSA; admitted on 12/28/2016, with complaint of shortness of breath, was found to have sepsis from cellulitis with acute gout. Currently further plan is continue current management. Pending SNF, patient hesitant on going to SNF. Assessment & Plan   Sepsis with cellulitis -History of CLL -Patient presented with fever, tachycardia with shortness of breath requiring BiPAP on admission. -Has been afebrile and leukocytosis/tachycardia imrpoving -Workup shows unremarkable chest x-ray, blood cultures negative to date, UA concerning for possible UTI as well as significant redness and swelling of the right wrist. -No urine culture done, no complaints of dysuria -Started on vancomycin and Zosyn, and transitioned to clindamycin -Continue probiotics  Right wrist gouty arthritis -Severe joint pain, getting better with immobilization with sling 2. -MRI hand: Soft tissue and intramuscular signal abnormalities and edema consistent with soft tissue edema/cellulitis in or my ascites. No abscess identified. Probable tear of the triangular fibrocartilage -Currently on IV clindamycin. -Severely elevated ESR and CRP. -Uric acid 12.5 on admission. -Has chronic kidney disease and therefore unable to use colchicine or NSAIDs. -Placed on oral steroids. -Hand/Orthopedics, Dr. Fredna Dow, consulted appreciated-recommended moving wrist bands from under splint -PT, OT consulted and recommended SNF -Patient hesitant on going to SNF  A. fib with RVR -Likely secondary to sepsis on admission. -On Coumadin. -INR supratherapeutic, downt o 3.95 (has received vitamin K) -Continue metoprolol  Dyspnea -Multifactorial causes: sepsis, Afib with RVR,  OSA, CHF -patient required bipap on admission and was on 3L [place] with sats in the mid 90s -Treat underlying conditions -Appears to be improving   OSA -continue C Pap daily at bedtime  Type 2 diabetes mellitus -Steroid-induced hyperglycemia. -Continue Amaryl, Lantus as well as sliding scale coverage and CBG monitoring   Chronic kidney disease stage III -Creatinine currently 1.37, continue to monitor BMP -Of note patient was given dose of metolazone and furosemide as an outpatient which may have contributed to his hyper uricemia  Chronic diastolic dysfunction -Continue torsemide -Monitor intake and output, daily weights  Chronic thrombocytopenia/ CLL -Continue to monitor CBC  Moderate malnutrition -Nutrition consulted appreciated, continue supplements  DVT Prophylaxis  supratherapeutic INR  Code Status: DO NOT RESUSCITATE  Family Communication: None at bedside  Disposition Plan: Admitted, pending improvement in patient's pain. Possible discharge to SNF on 01/02/2017 if he agrees (although per H&P, patient from [facility])  Consultants Hand surgery  Procedures  None  Antibiotics   Anti-infectives    Start     Dose/Rate Route Frequency Ordered Stop   12/30/16 1600  clindamycin (CLEOCIN) IVPB 600 mg     600 mg 100 mL/hr over 30 Minutes Intravenous Every 8 hours 12/30/16 1555     12/29/16 2100  vancomycin (VANCOCIN) 1,250 mg in sodium chloride 0.9 % 250 mL IVPB  Status:  Discontinued     1,250 mg 166.7 mL/hr over 90 Minutes Intravenous Every 24 hours 12/28/16 2107 12/30/16 1235   12/29/16 0600  piperacillin-tazobactam (ZOSYN) IVPB 3.375 g  Status:  Discontinued     3.375 g 12.5 mL/hr over 240 Minutes Intravenous Every 8 hours 12/28/16 2107 12/30/16 1554   12/28/16 2115  vancomycin (VANCOCIN) 2,000 mg in sodium chloride 0.9 % 500 mL IVPB     2,000 mg  250 mL/hr over 120 Minutes Intravenous  Once 12/28/16 2102 12/28/16 2345   12/28/16 2100  piperacillin-tazobactam (ZOSYN)  IVPB 3.375 g     3.375 g 100 mL/hr over 30 Minutes Intravenous  Once 12/28/16 2058 12/28/16 2243   12/28/16 2100  vancomycin (VANCOCIN) IVPB 1000 mg/200 mL premix  Status:  Discontinued     1,000 mg 200 mL/hr over 60 Minutes Intravenous  Once 12/28/16 2058 12/29/16 0315      Subjective:   William Braun seen and examined today.  Patient states he is feeling mildly better and feels breathing has improved. Continues to have pain in his hand. Denies any chest pain, abdominal pain, nausea or vomiting. In general states he just is tired.  Objective:   Vitals:   12/31/16 1200 12/31/16 2100 12/31/16 2151 01/01/17 0553  BP: 104/64 117/78  120/83  Pulse: 90 94  81  Resp: 18 18 20 20   Temp: 98.4 F (36.9 C) 98.2 F (36.8 C)  98.3 F (36.8 C)  TempSrc: Oral Oral  Axillary  SpO2: 98% 95%  93%  Weight:    107.6 kg (237 lb 3.2 oz)  Height:        Intake/Output Summary (Last 24 hours) at 01/01/17 1013 Last data filed at 01/01/17 6010  Gross per 24 hour  Intake             1330 ml  Output             3450 ml  Net            -2120 ml   Filed Weights   12/30/16 0404 12/31/16 0542 01/01/17 0553  Weight: 105.4 kg (232 lb 6.4 oz) 104.6 kg (230 lb 9.6 oz) 107.6 kg (237 lb 3.2 oz)    Exam  General: Well developed, well nourished, NAD, appears stated age  47: NCAT,mucous membranes moist.   Cardiovascular: S1 S2 auscultated,Irregular  Respiratory: Diminished breath sounds however clear  Abdomen: Soft, nontender, nondistended, + bowel sounds  Extremities: warm dry without cyanosis clubbing. Trace pedal edema  Neuro: AAOx3, nonfocal, right hand and splint  Skin: Without rashes exudates or nodules  Psych: Depressed/Flat affect   Data Reviewed: I have personally reviewed following labs and imaging studies  CBC:  Recent Labs Lab 12/28/16 1955 12/29/16 0231 12/30/16 0350 12/31/16 0322 01/01/17 0526  WBC 24.6* 21.9* 20.9* 23.8* 21.7*  NEUTROABS 11.8* 10.3* 11.3* 14.0*  --     HGB 13.8 13.0 11.9* 12.2* 12.9*  HCT 42.3 40.3 36.7* 37.0* 39.3  MCV 93.8 94.4 92.7 92.0 92.0  PLT 148* 143* 138* 172 932   Basic Metabolic Panel:  Recent Labs Lab 12/28/16 1955 12/29/16 0231 12/30/16 0350 12/31/16 0322 01/01/17 0526  NA 139 142 139 135 139  K 4.7 4.5 4.0 4.0 3.3*  CL 91* 94* 93* 88* 88*  CO2 35* 39* 36* 36* 41*  GLUCOSE 234* 168* 175* 276* 164*  BUN 32* 32* 48* 60* 65*  CREATININE 1.77* 1.80* 1.99* 1.66* 1.37*  CALCIUM 9.2 8.8* 8.3* 8.5* 8.6*  MG  --   --  2.2 2.6*  --    GFR: Estimated Creatinine Clearance: 55.3 mL/min (A) (by C-G formula based on SCr of 1.37 mg/dL (H)). Liver Function Tests:  Recent Labs Lab 12/28/16 1955 12/29/16 0231 12/30/16 0350 12/31/16 0322  AST 30 23 18 19   ALT 16* 14* 11* 14*  ALKPHOS 88 76 69 79  BILITOT 0.7 1.1 0.6 0.5  PROT 7.1 6.5 6.2* 6.4*  ALBUMIN 2.7* 2.4* 2.1* 2.0*   No results for input(s): LIPASE, AMYLASE in the last 168 hours. No results for input(s): AMMONIA in the last 168 hours. Coagulation Profile:  Recent Labs Lab 12/29/16 0231 12/30/16 0350 12/31/16 0322 01/01/17 0526  INR 3.68 5.73* 4.94* 3.95   Cardiac Enzymes:  Recent Labs Lab 12/29/16 0231 12/29/16 0814 12/29/16 1448  TROPONINI 0.05* 0.08* 0.06*   BNP (last 3 results) No results for input(s): PROBNP in the last 8760 hours. HbA1C: No results for input(s): HGBA1C in the last 72 hours. CBG:  Recent Labs Lab 12/31/16 0736 12/31/16 1147 12/31/16 1559 12/31/16 2122 01/01/17 0724  GLUCAP 170* 191* 286* 330* 138*   Lipid Profile: No results for input(s): CHOL, HDL, LDLCALC, TRIG, CHOLHDL, LDLDIRECT in the last 72 hours. Thyroid Function Tests: No results for input(s): TSH, T4TOTAL, FREET4, T3FREE, THYROIDAB in the last 72 hours. Anemia Panel: No results for input(s): VITAMINB12, FOLATE, FERRITIN, TIBC, IRON, RETICCTPCT in the last 72 hours. Urine analysis:    Component Value Date/Time   COLORURINE AMBER (A) 12/28/2016 2150    APPEARANCEUR TURBID (A) 12/28/2016 2150   LABSPEC 1.008 12/28/2016 2150   PHURINE 6.0 12/28/2016 2150   GLUCOSEU NEGATIVE 12/28/2016 2150   HGBUR MODERATE (A) 12/28/2016 2150   BILIRUBINUR NEGATIVE 12/28/2016 2150   KETONESUR NEGATIVE 12/28/2016 2150   PROTEINUR 100 (A) 12/28/2016 2150   UROBILINOGEN 0.2 09/18/2010 1120   NITRITE POSITIVE (A) 12/28/2016 2150   LEUKOCYTESUR LARGE (A) 12/28/2016 2150   Sepsis Labs: @LABRCNTIP (procalcitonin:4,lacticidven:4)  ) Recent Results (from the past 240 hour(s))  Culture, blood (routine x 2)     Status: None (Preliminary result)   Collection Time: 12/28/16  7:55 PM  Result Value Ref Range Status   Specimen Description BLOOD LEFT FOREARM  Final   Special Requests   Final    BOTTLES DRAWN AEROBIC AND ANAEROBIC Blood Culture adequate volume   Culture NO GROWTH 3 DAYS  Final   Report Status PENDING  Incomplete  Culture, blood (routine x 2)     Status: None (Preliminary result)   Collection Time: 12/28/16  8:25 PM  Result Value Ref Range Status   Specimen Description BLOOD RIGHT HAND  Final   Special Requests   Final    BOTTLES DRAWN AEROBIC AND ANAEROBIC Blood Culture adequate volume   Culture NO GROWTH 3 DAYS  Final   Report Status PENDING  Incomplete  MRSA PCR Screening     Status: None   Collection Time: 12/29/16  5:28 AM  Result Value Ref Range Status   MRSA by PCR NEGATIVE NEGATIVE Final    Comment:        The GeneXpert MRSA Assay (FDA approved for NASAL specimens only), is one component of a comprehensive MRSA colonization surveillance program. It is not intended to diagnose MRSA infection nor to guide or monitor treatment for MRSA infections.       Radiology Studies: Mr Hand Right Wo Contrast  Result Date: 12/31/2016 CLINICAL DATA:  Diffuse swelling and pain of the right wrist EXAM: MRI OF THE RIGHT HAND WITHOUT CONTRAST TECHNIQUE: Multiplanar, multisequence MR imaging of the right wrist was performed. No intravenous  contrast was administered. COMPARISON:  Radiographs from 12/29/2016 FINDINGS: Technologist notes that the patient had a difficult time breathing during the exam. Only a limited amount of imaging was acquired before study had to be terminated with axial T1, axial T2 fat sat and STIR images of the wrist and metacarpals acquired. There is diffuse soft  tissue edema of the visualized hand and wrist consistent with cellulitis. No focal abscess collection is noted. Intramuscular edema is also noted which may represent a diffuse myositis. Subcortical degenerative cystic changes of the distal radius and ulna at the distal radioulnar joint, intercarpal, heads of the first and second metacarpals and head of first proximal phalanx are noted. No acute fracture or dislocations. No evidence of pyomyositis. Small amount of fluid in the distal radioulnar joint likely represent a tear through the triangular fibrocartilage complex. No abnormal signal abnormalities of the extensor and flexor tendons crossing the wrist. The flexor retinaculum is not thickened in appearance. IMPRESSION: Soft tissue and intramuscular signal abnormalities and edema consistent with soft tissue edema/cellulitis and/or myositis. No focal abscess identified. Osteoarthritic sub cortical cystic change as above. No signal abnormalities of the flexor nor extensor tendons crossing the wrist. Probable tear of the triangular fibrocartilage complex given fluid in the distal radioulnar joint. Electronically Signed   By: Ashley Royalty M.D.   On: 12/31/2016 02:09     Scheduled Meds: . atorvastatin  10 mg Oral q1800  . chlorhexidine  15 mL Mouth Rinse BID  . clindamycin (CLEOCIN) IV  600 mg Intravenous Q8H  . divalproex  250 mg Oral BID  . feeding supplement (ENSURE ENLIVE)  237 mL Oral TID PC  . glimepiride  8 mg Oral Q breakfast  . insulin aspart  0-9 Units Subcutaneous TID WC  . insulin glargine  18 Units Subcutaneous Q2200  . mouth rinse  15 mL Mouth Rinse  q12n4p  . metoprolol succinate  25 mg Oral Daily  . mometasone-formoterol  2 puff Inhalation BID  . multivitamin with minerals  1 tablet Oral Daily  . polyethylene glycol  17 g Oral Daily  . potassium chloride  40 mEq Oral Once  . predniSONE  40 mg Oral Q breakfast  . torsemide  60 mg Oral BID   Continuous Infusions:   LOS: 3 days   Time Spent in minutes   30 minutes  Yogesh Cominsky D.O. on 01/01/2017 at 10:13 AM  Between 7am to 7pm - Pager - [phone]  After 7pm go to [email or web address] - password [email or web address]  And look for the night coverage person covering for me after hours  [facility] Office  [phone]

## 2017-01-01 NOTE — Progress Notes (Signed)
[place] for Warfarin  Indication: atrial fibrillation  Assessment: 83 y/o M on warfarin PTA for afib, warfarin initially held pending hand surgery consult, no plans for surgery, warfarin restarted. INR was supra-therapeutic at 3.68 on admit.   INR trending down today to 3.95 (supratherapeutic), which is a full point drop from yesterday. Vitamin K 1 mg po given 4/13. Hemoglobin and platelets stable. No overt bleeding noted. Appetite improving and antibiotics have been discontinued - which may have contributed to supratherapeutic levels. Anticipate that INR will continue to decline given 4 held doses and sharp decrease in INR - will cautiously restart warfarin to prevent subtherapeutic levels.  Goal of Therapy:  INR 2-3 Monitor platelets by anticoagulation protocol: Yes   Plan:  Warfarin 1 mg x 1 tonight Daily INR   Allergies  Allergen Reactions  . Quinine Palpitations and Other (See Comments)    Seizure    Labs:  Recent Labs  12/29/16 1448  12/30/16 0350 12/31/16 0322 01/01/17 0526  HGB  --   < > 11.9* 12.2* 12.9*  HCT  --   --  36.7* 37.0* 39.3  PLT  --   --  138* 172 199  LABPROT  --   --  44.8* 47.4* 39.6*  INR  --   --  5.73* 4.94* 3.95  CREATININE  --   --  1.99* 1.66* 1.37*  TROPONINI 0.06*  --   --   --   --   < > = values in this interval not displayed.  Estimated Creatinine Clearance: 55.3 mL/min (A) (by C-G formula based on SCr of 1.37 mg/dL (H)).  Belia Heman, PharmD PGY1 Pharmacy Resident [phone] (Pager) 01/01/2017 11:22 AM

## 2017-01-02 ENCOUNTER — Inpatient Hospital Stay (HOSPITAL_COMMUNITY): Payer: PPO

## 2017-01-02 LAB — GLUCOSE, CAPILLARY
GLUCOSE-CAPILLARY: 65 mg/dL (ref 65–99)
GLUCOSE-CAPILLARY: 73 mg/dL (ref 65–99)
GLUCOSE-CAPILLARY: 395 mg/dL — AB (ref 65–99)
Glucose-Capillary: 170 mg/dL — ABNORMAL HIGH (ref 65–99)
Glucose-Capillary: 415 mg/dL — ABNORMAL HIGH (ref 65–99)
Glucose-Capillary: 321 mg/dL — ABNORMAL HIGH (ref 65–99)

## 2017-01-02 LAB — CULTURE, BLOOD (ROUTINE X 2)
CULTURE: NO GROWTH
Culture: NO GROWTH
SPECIAL REQUESTS: ADEQUATE
Special Requests: ADEQUATE

## 2017-01-02 LAB — CBC
HCT: 41.8 % (ref 39.0–52.0)
HEMOGLOBIN: 13.8 g/dL (ref 13.0–17.0)
MCH: 30.3 pg (ref 26.0–34.0)
MCHC: 33 g/dL (ref 30.0–36.0)
MCV: 91.7 fL (ref 78.0–100.0)
Platelets: 192 10*3/uL (ref 150–400)
RBC: 4.56 MIL/uL (ref 4.22–5.81)
RDW: 17.5 % — ABNORMAL HIGH (ref 11.5–15.5)
WBC: 20 10*3/uL — AB (ref 4.0–10.5)

## 2017-01-02 LAB — BASIC METABOLIC PANEL
ANION GAP: 8 (ref 5–15)
BUN: 51 mg/dL — ABNORMAL HIGH (ref 6–20)
CALCIUM: 8.7 mg/dL — AB (ref 8.9–10.3)
CO2: 41 mmol/L — ABNORMAL HIGH (ref 22–32)
Chloride: 90 mmol/L — ABNORMAL LOW (ref 101–111)
Creatinine, Ser: 1.16 mg/dL (ref 0.61–1.24)
GFR, EST NON AFRICAN AMERICAN: 57 mL/min — AB (ref >60)
Glucose, Bld: 123 mg/dL — ABNORMAL HIGH (ref 65–99)
Potassium: 3.3 mmol/L — ABNORMAL LOW (ref 3.5–5.1)
SODIUM: 139 mmol/L (ref 135–145)

## 2017-01-02 LAB — PROTIME-INR
INR: 2.51
PROTHROMBIN TIME: 27.6 s — AB (ref 11.4–15.2)

## 2017-01-02 MED ORDER — PREDNISONE 20 MG PO TABS
30.0000 mg | ORAL_TABLET | Freq: Every day | ORAL | Status: DC
  Administered 2017-01-03: 09:00:00 30 mg via ORAL
  Filled 2017-01-02: qty 1

## 2017-01-02 MED ORDER — WARFARIN SODIUM 3 MG PO TABS
3.0000 mg | ORAL_TABLET | Freq: Once | ORAL | Status: AC
Start: 2017-01-02 — End: 2017-01-02
  Administered 2017-01-02: 3 mg via ORAL
  Filled 2017-01-02: qty 1

## 2017-01-02 MED ORDER — CLINDAMYCIN HCL 300 MG PO CAPS
600.0000 mg | ORAL_CAPSULE | Freq: Three times a day (TID) | ORAL | Status: DC
  Administered 2017-01-02 – 2017-01-03 (×3): 600 mg via ORAL
  Filled 2017-01-02 (×3): qty 2

## 2017-01-02 MED ORDER — METOLAZONE 2.5 MG PO TABS
2.5000 mg | ORAL_TABLET | Freq: Once | ORAL | Status: AC
  Administered 2017-01-02: 2.5 mg via ORAL
  Filled 2017-01-02: qty 1

## 2017-01-02 NOTE — Progress Notes (Signed)
ANTICOAGULATION CONSULT NOTE - Follow Up Consult  Pharmacy Consult:  Coumadin Indication: atrial fibrillation  Allergies  Allergen Reactions  . Quinine Palpitations and Other (See Comments)    Seizure     Patient Measurements: Height: 6\' 2"  (188 cm) Weight: 236 lb 8 oz (107.3 kg) IBW/kg (Calculated) : 82.2  Vital Signs: Temp: 97.6 F (36.4 C) (04/16 0518) Temp Source: Oral (04/16 0518) BP: 124/85 (04/16 0518) Pulse Rate: 94 (04/16 0518)  Labs:  Recent Labs  12/31/16 0322 01/01/17 0526 01/02/17 0346  HGB 12.2* 12.9* 13.8  HCT 37.0* 39.3 41.8  PLT 172 199 192  LABPROT 47.4* 39.6* 27.6*  INR 4.94* 3.95 2.51  CREATININE 1.66* 1.37* 1.16    Estimated Creatinine Clearance: 65.1 mL/min (by C-G formula based on SCr of 1.16 mg/dL).     Assessment: 78 YOM with history of AFib on Coumadin PTA.  Coumadin was reversed with Vitamin K on 12/30/16 for possible hand surgery.  Did not proceed with surgery and Coumadin resumed on 01/01/17.  INR decreased significantly to therapeutic level today.  No bleeding reported.   Goal of Therapy:  INR 2-3    Plan:  - Coumadin 3mg  PO today - Daily PT / INR - F/U KCL supplementation, CBGs, abx LOT   Zaydon Kinser D. Mina Marble, PharmD, BCPS Pager:  [phone] 01/02/2017, 8:13 AM

## 2017-01-02 NOTE — Progress Notes (Signed)
RN called because patient with some labored breathing and complaint of SOB.  Per RN O2 sats 88%.  Upon my arrival patient on Cpap with some labored breathing and decreased breath sounds in bases.  Patient is fully alert and conversant.  With out any intervention he looked more relaxed and was breathing easy.  O2 sat 94% on Cpap.  Placed patient on 3l [place] which apparently is his baseline during the day.  Also repositioned patient in the bed and elevated his arms.  RN gave AM meds.  Patient currently in no distress and appears comfortable.  RR 16 O2 sat 98% on 3l [place].  RN to call if patient has additional distress. Per patient he consistently wears his Cpap during bedtime and if he takes a nap.

## 2017-01-02 NOTE — Care Management Important Message (Signed)
Important Message  Patient Details  Name: William Braun MRN: [id number] Date of Birth: [birth year]   Medicare Important Message Given:  Yes    [name] 01/02/2017, 1:17 PM

## 2017-01-02 NOTE — Progress Notes (Signed)
Patient complains of difficulty breathing.  Had breathing treatment at 0903.  Patient on CPAP, O2 SAT at 95% with 3L oxygen.  HOB elevated.  Rapid response notified.  Nurse at bedside. Marcille Blanco, RN

## 2017-01-02 NOTE — Progress Notes (Addendum)
PROGRESS NOTE    William Braun  BVQ:[id number] DOB: 1933/06/15 DOA: 12/28/2016 PCP: Leeroy Cha, MD   Chief Complaint  Patient presents with  . Shortness of Breath    Brief Narrative:  Pt. with PMH of CLL with chronic leukocytosis, chronic CHF, A. fib, DM,CKD , HTN, OSA; admitted on 12/28/2016, with complaint of shortness of breath, was found to have sepsis from cellulitis with acute gout. Currently further plan is continue current management. Pending SNF, patient hesitant on going to SNF. Assessment & Plan   Sepsis with cellulitis -History of CLL -Patient presented with fever, tachycardia with shortness of breath requiring BiPAP on admission. -Has been afebrile and leukocytosis/tachycardia imrpoving -Workup shows unremarkable chest x-ray, blood cultures negative to date, UA concerning for possible UTI as well as significant redness and swelling of the right wrist. -No urine culture done, no complaints of dysuria -Started on vancomycin and Zosyn, and transitioned to clindamycin, Last day tomorrow -Continue probiotics  Right wrist gouty arthritis -Severe joint pain, getting better with immobilization with sling 2. -MRI hand: Soft tissue and intramuscular signal abnormalities and edema consistent with soft tissue edema/cellulitis in or my ascites. No abscess identified. Probable tear of the triangular fibrocartilage -Currently on IV clindamycin. -Severely elevated ESR and CRP. -Uric acid 12.5 on admission. -Has chronic kidney disease and therefore unable to use colchicine or NSAIDs. -Placed on oral steroids., We will reduce from 40 mg daily to 30 mg daily -Hand/Orthopedics, Dr. Fredna Dow, consulted appreciated-recommended moving wrist bands from under splint -PT, OT consulted and recommended SNF -Patient hesitant on going to SNF  A. fib with RVR -Likely secondary to sepsis on admission. -On Coumadin. -INR supratherapeutic, downt o 3.95 (has received vitamin K) -Continue  metoprolol  Dyspnea -Multifactorial causes: sepsis, Afib with RVR, OSA, CHF -patient required bipap on admission and was on 3L [place] with sats in the mid 90s -Treat underlying conditions -Another episode today, telemetry unremarkable, chest x-ray unremarkable. Mild evidence of volume overload, I would give Zaroxolyn. Pt takes it daily.  OSA -continue C Pap daily at bedtime  Type 2 diabetes mellitus -Steroid-induced hyperglycemia. -Continue Amaryl, Lantus as well as sliding scale coverage and CBG monitoring   Chronic kidney disease stage III -Creatinine currently 1.37, continue to monitor BMP -Of note patient was given dose of metolazone and furosemide as an outpatient which may have contributed to his hyper uricemia  Chronic diastolic dysfunction -Continue torsemide, added Zaroxolyn -Monitor intake and output, daily weights  Chronic thrombocytopenia/ CLL -Continue to monitor CBC  Moderate malnutrition -Nutrition consulted appreciated, continue supplements  DVT Prophylaxis  supratherapeutic INR  Code Status: DO NOT RESUSCITATE  Family Communication: None at bedside  Disposition Plan: Admitted, SNF discharge 01/03/2017  Consultants Hand surgery  Procedures  None  Antibiotics   Anti-infectives    Start     Dose/Rate Route Frequency Ordered Stop   01/02/17 1545  clindamycin (CLEOCIN) capsule 600 mg     600 mg Oral Every 8 hours 01/02/17 1541 01/03/17 2159   12/30/16 1600  clindamycin (CLEOCIN) IVPB 600 mg  Status:  Discontinued     600 mg 100 mL/hr over 30 Minutes Intravenous Every 8 hours 12/30/16 1555 01/02/17 1541   12/29/16 2100  vancomycin (VANCOCIN) 1,250 mg in sodium chloride 0.9 % 250 mL IVPB  Status:  Discontinued     1,250 mg 166.7 mL/hr over 90 Minutes Intravenous Every 24 hours 12/28/16 2107 12/30/16 1235   12/29/16 0600  piperacillin-tazobactam (ZOSYN) IVPB 3.375 g  Status:  Discontinued     3.375 g 12.5 mL/hr over 240 Minutes Intravenous Every 8 hours  12/28/16 2107 12/30/16 1554   12/28/16 2115  vancomycin (VANCOCIN) 2,000 mg in sodium chloride 0.9 % 500 mL IVPB     2,000 mg 250 mL/hr over 120 Minutes Intravenous  Once 12/28/16 2102 12/28/16 2345   12/28/16 2100  piperacillin-tazobactam (ZOSYN) IVPB 3.375 g     3.375 g 100 mL/hr over 30 Minutes Intravenous  Once 12/28/16 2058 12/28/16 2243   12/28/16 2100  vancomycin (VANCOCIN) IVPB 1000 mg/200 mL premix  Status:  Discontinued     1,000 mg 200 mL/hr over 60 Minutes Intravenous  Once 12/28/16 2058 12/29/16 0315      Subjective:   William Braun seen and examined today.  Episode of shortness of breath. No nausea no vomiting.  Objective:   Vitals:   01/02/17 0903 01/02/17 1020 01/02/17 1221 01/02/17 1251  BP:  111/78  (!) 136/95  Pulse: 82 91  (!) 103  Resp: 20 16  18   Temp:    98 F (36.7 C)  TempSrc:    Oral  SpO2: 94% 98% 95% 95%  Weight:      Height:        Intake/Output Summary (Last 24 hours) at 01/02/17 1542 Last data filed at 01/02/17 0900  Gross per 24 hour  Intake              360 ml  Output             2700 ml  Net            -2340 ml   Filed Weights   12/31/16 0542 01/01/17 0553 01/02/17 0518  Weight: 104.6 kg (230 lb 9.6 oz) 107.6 kg (237 lb 3.2 oz) 107.3 kg (236 lb 8 oz)    Exam  General: Well developed, well nourished, NAD, appears stated age  83: NCAT,mucous membranes moist.   Cardiovascular: S1 S2 auscultated,Irregular  Respiratory: Diminished breath sounds however clear  Abdomen: Soft, nontender, nondistended, + bowel sounds  Extremities: warm dry without cyanosis clubbing. Trace pedal edema  Neuro: AAOx3, nonfocal, right hand and splint  Skin: Without rashes exudates or nodules  Psych: Depressed/Flat affect   Data Reviewed: I have personally reviewed following labs and imaging studies  CBC:  Recent Labs Lab 12/28/16 1955 12/29/16 0231 12/30/16 0350 12/31/16 0322 01/01/17 0526 01/02/17 0346  WBC 24.6* 21.9* 20.9* 23.8* 21.7*  20.0*  NEUTROABS 11.8* 10.3* 11.3* 14.0*  --   --   HGB 13.8 13.0 11.9* 12.2* 12.9* 13.8  HCT 42.3 40.3 36.7* 37.0* 39.3 41.8  MCV 93.8 94.4 92.7 92.0 92.0 91.7  PLT 148* 143* 138* 172 199 737   Basic Metabolic Panel:  Recent Labs Lab 12/29/16 0231 12/30/16 0350 12/31/16 0322 01/01/17 0526 01/02/17 0346  NA 142 139 135 139 139  K 4.5 4.0 4.0 3.3* 3.3*  CL 94* 93* 88* 88* 90*  CO2 39* 36* 36* 41* 41*  GLUCOSE 168* 175* 276* 164* 123*  BUN 32* 48* 60* 65* 51*  CREATININE 1.80* 1.99* 1.66* 1.37* 1.16  CALCIUM 8.8* 8.3* 8.5* 8.6* 8.7*  MG  --  2.2 2.6*  --   --    GFR: Estimated Creatinine Clearance: 65.1 mL/min (by C-G formula based on SCr of 1.16 mg/dL). Liver Function Tests:  Recent Labs Lab 12/28/16 1955 12/29/16 0231 12/30/16 0350 12/31/16 0322  AST 30 23 18 19   ALT 16* 14* 11* 14*  ALKPHOS 88  76 69 79  BILITOT 0.7 1.1 0.6 0.5  PROT 7.1 6.5 6.2* 6.4*  ALBUMIN 2.7* 2.4* 2.1* 2.0*   No results for input(s): LIPASE, AMYLASE in the last 168 hours. No results for input(s): AMMONIA in the last 168 hours. Coagulation Profile:  Recent Labs Lab 12/29/16 0231 12/30/16 0350 12/31/16 0322 01/01/17 0526 01/02/17 0346  INR 3.68 5.73* 4.94* 3.95 2.51   Cardiac Enzymes:  Recent Labs Lab 12/29/16 0231 12/29/16 0814 12/29/16 1448  TROPONINI 0.05* 0.08* 0.06*   BNP (last 3 results) No results for input(s): PROBNP in the last 8760 hours. HbA1C: No results for input(s): HGBA1C in the last 72 hours. CBG:  Recent Labs Lab 01/01/17 1635 01/01/17 2127 01/02/17 0726 01/02/17 0746 01/02/17 1132  GLUCAP 332* 237* 65 73 170*   Lipid Profile: No results for input(s): CHOL, HDL, LDLCALC, TRIG, CHOLHDL, LDLDIRECT in the last 72 hours. Thyroid Function Tests: No results for input(s): TSH, T4TOTAL, FREET4, T3FREE, THYROIDAB in the last 72 hours. Anemia Panel: No results for input(s): VITAMINB12, FOLATE, FERRITIN, TIBC, IRON, RETICCTPCT in the last 72 hours. Urine  analysis:    Component Value Date/Time   COLORURINE AMBER (A) 12/28/2016 2150   APPEARANCEUR TURBID (A) 12/28/2016 2150   LABSPEC 1.008 12/28/2016 2150   PHURINE 6.0 12/28/2016 2150   GLUCOSEU NEGATIVE 12/28/2016 2150   HGBUR MODERATE (A) 12/28/2016 2150   BILIRUBINUR NEGATIVE 12/28/2016 2150   KETONESUR NEGATIVE 12/28/2016 2150   PROTEINUR 100 (A) 12/28/2016 2150   UROBILINOGEN 0.2 09/18/2010 1120   NITRITE POSITIVE (A) 12/28/2016 2150   LEUKOCYTESUR LARGE (A) 12/28/2016 2150   Sepsis Labs: @LABRCNTIP (procalcitonin:4,lacticidven:4)  ) Recent Results (from the past 240 hour(s))  Culture, blood (routine x 2)     Status: None   Collection Time: 12/28/16  7:55 PM  Result Value Ref Range Status   Specimen Description BLOOD LEFT FOREARM  Final   Special Requests   Final    BOTTLES DRAWN AEROBIC AND ANAEROBIC Blood Culture adequate volume   Culture NO GROWTH 5 DAYS  Final   Report Status 01/02/2017 FINAL  Final  Culture, blood (routine x 2)     Status: None   Collection Time: 12/28/16  8:25 PM  Result Value Ref Range Status   Specimen Description BLOOD RIGHT HAND  Final   Special Requests   Final    BOTTLES DRAWN AEROBIC AND ANAEROBIC Blood Culture adequate volume   Culture NO GROWTH 5 DAYS  Final   Report Status 01/02/2017 FINAL  Final  MRSA PCR Screening     Status: None   Collection Time: 12/29/16  5:28 AM  Result Value Ref Range Status   MRSA by PCR NEGATIVE NEGATIVE Final    Comment:        The GeneXpert MRSA Assay (FDA approved for NASAL specimens only), is one component of a comprehensive MRSA colonization surveillance program. It is not intended to diagnose MRSA infection nor to guide or monitor treatment for MRSA infections.       Radiology Studies: No results found.   Scheduled Meds: . atorvastatin  10 mg Oral q1800  . chlorhexidine  15 mL Mouth Rinse BID  . clindamycin  600 mg Oral Q8H  . divalproex  250 mg Oral BID  . feeding supplement (ENSURE  ENLIVE)  237 mL Oral TID PC  . glimepiride  8 mg Oral Q breakfast  . insulin aspart  0-9 Units Subcutaneous TID WC  . insulin glargine  18 Units Subcutaneous Q2200  .  mouth rinse  15 mL Mouth Rinse q12n4p  . metolazone  2.5 mg Oral Once  . metoprolol succinate  25 mg Oral Daily  . mometasone-formoterol  2 puff Inhalation BID  . multivitamin with minerals  1 tablet Oral Daily  . polyethylene glycol  17 g Oral Daily  . [START ON 01/03/2017] predniSONE  30 mg Oral Q breakfast  . torsemide  60 mg Oral BID  . warfarin  3 mg Oral ONCE-1800  . Warfarin - Pharmacist Dosing Inpatient   Does not apply q1800   Continuous Infusions:   LOS: 4 days   Time Spent in minutes   30 minutes Author:  Berle Mull, MD [facility] Pager: [phone] 01/02/2017 3:45 PM     After 7pm go to [email or web address] - password [email or web address]  And look for the night coverage person covering for me after hours  [facility] Office  [phone]

## 2017-01-02 NOTE — Progress Notes (Signed)
Physical Therapy Treatment Patient Details Name: William Braun MRN: [id number] DOB: 1932/11/08 Today's Date: 01/02/2017    History of Present Illness 83 y.o. male with chronic diastolic CHF, atrial fibrillation, diabetes mellitus, chronic kidney disease, sleep apnea, hypertension was brought to the ER after patient was acutely short of breath since last evening around 4 PM. Has been having nonproductive cough. Denies any chest pain. Patient also has been having persistent right wrist pain since last 24 hours. Denies any fall.  Dx of CHF, sepsis, UTI, possible gout R wrist.    PT Comments    Pt pleasant with flat affect and upon arrival right wrist splint on pt upside down. Corrected splint position and informed pt for proper wear. Pt reports continued fatigue limiting his ability to ambulate or perform further transfers today. Pt educated for HEP and encouraged throughout length of stay. Pt on 3L with sats 93%.    Follow Up Recommendations  SNF;Supervision for mobility/OOB     Equipment Recommendations  None recommended by PT    Recommendations for Other Services       Precautions / Restrictions Precautions Precautions: Fall Required Braces or Orthoses: Other Brace/Splint Other Brace/Splint: right wrist splint    Mobility  Bed Mobility Overal bed mobility: Needs Assistance Bed Mobility: Supine to Sit     Supine to sit: HOB elevated;Min guard     General bed mobility comments: increased time with HOB 30degrees and use of rail   Transfers Overall transfer level: Needs assistance   Transfers: Sit to/from Stand Sit to Stand: Mod assist;From elevated surface Stand pivot transfers: Min assist       General transfer comment: pt with cues for sequence, assist to rise and to stabilize RW for standing as pt with excessive use of RW and rail to stand from elevated surface. Pt able to pivot 3' bed to chair with RW with cues. Pt denied ambulation due to fatigue  Ambulation/Gait                  Stairs            Wheelchair Mobility    Modified Rankin (Stroke Patients Only)       Balance Overall balance assessment: Needs assistance   Sitting balance-Leahy Scale: Good       Standing balance-Leahy Scale: Poor                              Cognition Arousal/Alertness: Awake/alert Behavior During Therapy: Flat affect Overall Cognitive Status: Within Functional Limits for tasks assessed                                        Exercises General Exercises - Lower Extremity Long Arc Quad: AROM;Both;Seated;15 reps Hip ABduction/ADduction: AAROM;Both;Seated;15 reps Hip Flexion/Marching: AROM;Both;Seated;15 reps Toe Raises: AROM;Both;Seated;15 reps Heel Raises: AROM;15 reps;Both;Seated    General Comments        Pertinent Vitals/Pain Pain Assessment: No/denies pain    Home Living                      Prior Function            PT Goals (current goals can now be found in the care plan section) Progress towards PT goals: Progressing toward goals    Frequency    Min 3X/week  PT Plan Current plan remains appropriate    Co-evaluation             End of Session Equipment Utilized During Treatment: Gait belt;Oxygen Activity Tolerance: Patient limited by fatigue Patient left: in chair;with call bell/phone within reach;with family/visitor present;with nursing/sitter in room;with chair alarm set Nurse Communication: Mobility status PT Visit Diagnosis: Difficulty in walking, not elsewhere classified (R26.2);Muscle weakness (generalized) (M62.81)     Time: 1655-3748 PT Time Calculation (min) (ACUTE ONLY): 17 min  Charges:  $Therapeutic Activity: 8-22 mins                    G Codes:       Elwyn Reach, PT [phone]    [place] B Damere Brandenburg 01/02/2017, 12:25 PM

## 2017-01-02 NOTE — Progress Notes (Signed)
Inpatient Diabetes Program Recommendations  AACE/ADA: New Consensus Statement on Inpatient Glycemic Control (2015)  Target Ranges:  Prepandial:   less than 140 mg/dL      Peak postprandial:   less than 180 mg/dL (1-2 hours)      Critically ill patients:  140 - 180 mg/dL   Lab Results  Component Value Date   GLUCAP 73 01/02/2017   HGBA1C 7.7 (H) 10/30/2016    Review of Glycemic Control Results for William Braun, William Braun (MRN [id number]) as of 01/02/2017 11:04  Ref. Range 01/01/2017 11:31 01/01/2017 16:35 01/01/2017 21:27 01/02/2017 07:26 01/02/2017 07:46  Glucose-Capillary Latest Ref Range: 65 - 99 mg/dL 180 (H) 332 (H) 237 (H) 65 73   Inpatient Diabetes Program Recommendations:  Fasting CBG 65. Please consider decrease in Lantus to 14 units daily.  Thank you, Nani Gasser. Wasil Wolke, RN, MSN, CDE  Diabetes Coordinator Inpatient Glycemic Control Team Team Pager [phone] (8am-5pm) 01/02/2017 11:06 AM

## 2017-01-03 ENCOUNTER — Encounter (HOSPITAL_COMMUNITY): Payer: PPO

## 2017-01-03 DIAGNOSIS — I5032 Chronic diastolic (congestive) heart failure: Secondary | ICD-10-CM | POA: Diagnosis not present

## 2017-01-03 DIAGNOSIS — L03113 Cellulitis of right upper limb: Secondary | ICD-10-CM | POA: Diagnosis not present

## 2017-01-03 DIAGNOSIS — G4733 Obstructive sleep apnea (adult) (pediatric): Secondary | ICD-10-CM | POA: Diagnosis not present

## 2017-01-03 DIAGNOSIS — C9510 Chronic leukemia of unspecified cell type not having achieved remission: Secondary | ICD-10-CM | POA: Diagnosis not present

## 2017-01-03 DIAGNOSIS — R21 Rash and other nonspecific skin eruption: Secondary | ICD-10-CM | POA: Diagnosis not present

## 2017-01-03 DIAGNOSIS — I4891 Unspecified atrial fibrillation: Secondary | ICD-10-CM | POA: Diagnosis not present

## 2017-01-03 DIAGNOSIS — J9610 Chronic respiratory failure, unspecified whether with hypoxia or hypercapnia: Secondary | ICD-10-CM | POA: Diagnosis not present

## 2017-01-03 DIAGNOSIS — E662 Morbid (severe) obesity with alveolar hypoventilation: Secondary | ICD-10-CM | POA: Diagnosis not present

## 2017-01-03 DIAGNOSIS — M6088 Other myositis, other site: Secondary | ICD-10-CM | POA: Diagnosis not present

## 2017-01-03 DIAGNOSIS — J441 Chronic obstructive pulmonary disease with (acute) exacerbation: Secondary | ICD-10-CM | POA: Diagnosis not present

## 2017-01-03 DIAGNOSIS — G4730 Sleep apnea, unspecified: Secondary | ICD-10-CM | POA: Diagnosis not present

## 2017-01-03 DIAGNOSIS — R488 Other symbolic dysfunctions: Secondary | ICD-10-CM | POA: Diagnosis not present

## 2017-01-03 DIAGNOSIS — R6520 Severe sepsis without septic shock: Secondary | ICD-10-CM | POA: Diagnosis not present

## 2017-01-03 DIAGNOSIS — Z7901 Long term (current) use of anticoagulants: Secondary | ICD-10-CM | POA: Diagnosis not present

## 2017-01-03 DIAGNOSIS — A419 Sepsis, unspecified organism: Secondary | ICD-10-CM | POA: Diagnosis not present

## 2017-01-03 DIAGNOSIS — S61201D Unspecified open wound of left index finger without damage to nail, subsequent encounter: Secondary | ICD-10-CM | POA: Diagnosis not present

## 2017-01-03 DIAGNOSIS — B370 Candidal stomatitis: Secondary | ICD-10-CM | POA: Diagnosis not present

## 2017-01-03 DIAGNOSIS — D696 Thrombocytopenia, unspecified: Secondary | ICD-10-CM | POA: Diagnosis not present

## 2017-01-03 DIAGNOSIS — I130 Hypertensive heart and chronic kidney disease with heart failure and stage 1 through stage 4 chronic kidney disease, or unspecified chronic kidney disease: Secondary | ICD-10-CM | POA: Diagnosis not present

## 2017-01-03 DIAGNOSIS — N183 Chronic kidney disease, stage 3 (moderate): Secondary | ICD-10-CM | POA: Diagnosis not present

## 2017-01-03 DIAGNOSIS — M109 Gout, unspecified: Secondary | ICD-10-CM | POA: Diagnosis not present

## 2017-01-03 DIAGNOSIS — E1149 Type 2 diabetes mellitus with other diabetic neurological complication: Secondary | ICD-10-CM | POA: Diagnosis not present

## 2017-01-03 DIAGNOSIS — M6281 Muscle weakness (generalized): Secondary | ICD-10-CM | POA: Diagnosis not present

## 2017-01-03 DIAGNOSIS — S61201A Unspecified open wound of left index finger without damage to nail, initial encounter: Secondary | ICD-10-CM | POA: Diagnosis not present

## 2017-01-03 DIAGNOSIS — Z794 Long term (current) use of insulin: Secondary | ICD-10-CM | POA: Diagnosis not present

## 2017-01-03 DIAGNOSIS — R0602 Shortness of breath: Secondary | ICD-10-CM | POA: Diagnosis not present

## 2017-01-03 DIAGNOSIS — E440 Moderate protein-calorie malnutrition: Secondary | ICD-10-CM | POA: Diagnosis not present

## 2017-01-03 LAB — CBC
HCT: 43.9 % (ref 39.0–52.0)
HEMOGLOBIN: 14.6 g/dL (ref 13.0–17.0)
MCH: 30.6 pg (ref 26.0–34.0)
MCHC: 33.3 g/dL (ref 30.0–36.0)
MCV: 92 fL (ref 78.0–100.0)
Platelets: 173 10*3/uL (ref 150–400)
RBC: 4.77 MIL/uL (ref 4.22–5.81)
RDW: 17.4 % — ABNORMAL HIGH (ref 11.5–15.5)
WBC: 21.7 10*3/uL — ABNORMAL HIGH (ref 4.0–10.5)

## 2017-01-03 LAB — BASIC METABOLIC PANEL
Anion gap: 10 (ref 5–15)
BUN: 57 mg/dL — AB (ref 6–20)
CHLORIDE: 86 mmol/L — AB (ref 101–111)
CO2: 44 mmol/L — ABNORMAL HIGH (ref 22–32)
Calcium: 8.9 mg/dL (ref 8.9–10.3)
Creatinine, Ser: 1.28 mg/dL — ABNORMAL HIGH (ref 0.61–1.24)
GFR calc Af Amer: 59 mL/min — ABNORMAL LOW (ref >60)
GFR calc non Af Amer: 51 mL/min — ABNORMAL LOW (ref >60)
GLUCOSE: 173 mg/dL — AB (ref 65–99)
POTASSIUM: 3.4 mmol/L — AB (ref 3.5–5.1)
Sodium: 140 mmol/L (ref 135–145)

## 2017-01-03 LAB — GLUCOSE, CAPILLARY
GLUCOSE-CAPILLARY: 301 mg/dL — AB (ref 65–99)
Glucose-Capillary: 134 mg/dL — ABNORMAL HIGH (ref 65–99)
Glucose-Capillary: 143 mg/dL — ABNORMAL HIGH (ref 65–99)

## 2017-01-03 LAB — PROTIME-INR
INR: 1.9
Prothrombin Time: 22.1 seconds — ABNORMAL HIGH (ref 11.4–15.2)

## 2017-01-03 LAB — MAGNESIUM: Magnesium: 2.3 mg/dL (ref 1.7–2.4)

## 2017-01-03 MED ORDER — GI COCKTAIL ~~LOC~~
30.0000 mL | Freq: Once | ORAL | Status: AC
  Administered 2017-01-03: 30 mL via ORAL
  Filled 2017-01-03: qty 30

## 2017-01-03 MED ORDER — METOLAZONE 2.5 MG PO TABS: 2.5000 mg | ORAL_TABLET | ORAL | 0 refills | Status: DC

## 2017-01-03 MED ORDER — CLINDAMYCIN HCL 300 MG PO CAPS: 600.0000 mg | ORAL_CAPSULE | Freq: Three times a day (TID) | ORAL | 0 refills | Status: DC

## 2017-01-03 MED ORDER — GLUCERNA SHAKE PO LIQD
237.0000 mL | Freq: Three times a day (TID) | ORAL | Status: DC
  Administered 2017-01-03: 237 mL via ORAL

## 2017-01-03 MED ORDER — HYDROCODONE-ACETAMINOPHEN 5-325 MG PO TABS: 1.0000 | ORAL_TABLET | Freq: Four times a day (QID) | ORAL | 0 refills | Status: DC | PRN

## 2017-01-03 MED ORDER — POTASSIUM CHLORIDE CRYS ER 20 MEQ PO TBCR
40.0000 meq | EXTENDED_RELEASE_TABLET | Freq: Once | ORAL | Status: AC
  Administered 2017-01-03: 40 meq via ORAL
  Filled 2017-01-03: qty 2

## 2017-01-03 MED ORDER — PREDNISONE 10 MG PO TABS: ORAL_TABLET | ORAL | 0 refills | Status: DC

## 2017-01-03 MED ORDER — CLINDAMYCIN HCL 150 MG PO CAPS: 450.0000 mg | ORAL_CAPSULE | Freq: Three times a day (TID) | ORAL | 0 refills | Status: AC

## 2017-01-03 MED ORDER — WARFARIN SODIUM 2.5 MG PO TABS: 4.5000 mg | ORAL_TABLET | Freq: Once | ORAL | Status: DC

## 2017-01-03 MED ORDER — GLUCERNA SHAKE PO LIQD: 237.0000 mL | Freq: Three times a day (TID) | ORAL | 0 refills | Status: DC

## 2017-01-03 NOTE — Clinical Social Work Note (Addendum)
CSW started insurance authorization for discharge to SNF today.  Dayton Scrape, [place] [phone]  4:06 pm Insurance authorization obtained: [id number].  Dayton Scrape, [place]

## 2017-01-03 NOTE — Clinical Social Work Note (Signed)
CSW facilitated patient discharge including contacting patient family and facility to confirm patient discharge plans. Clinical information faxed to facility and family agreeable with plan. CSW arranged ambulance transport via [facility] to [place]. RN to call report prior to discharge [phone] Room 107).  CSW will sign off for now as social work intervention is no longer needed. Please consult [place] again if new needs arise.  Dayton Scrape, [place]

## 2017-01-03 NOTE — Progress Notes (Signed)
Nutrition Follow-up  DOCUMENTATION CODES:   Non-severe (moderate) malnutrition in context of chronic illness  INTERVENTION:  Provide Glucerna Shake po TID, each supplement provides 220 kcal and 10 grams of protein Continue Multivitamin with minerals daily   NUTRITION DIAGNOSIS:   Malnutrition related to chronic illness as evidenced by percent weight loss, mild depletion of muscle mass.  ongoing  GOAL:   Patient will meet greater than or equal to 90% of their needs  Being met  MONITOR:   PO intake, Supplement acceptance, Skin, Weight trends, Labs, I & O's  REASON FOR ASSESSMENT:   Malnutrition Screening Tool    ASSESSMENT:   83 y.o. male with chronic diastolic CHF, atrial fibrillation, diabetes mellitus, chronic kidney disease, sleep apnea, hypertension was brought to the ER after patient was acutely short of breath since last evening around 4 PM.   Pt states that he has a good appetite and has been eating 100% of meals and drinking 3 bottles of Ensure daily. Per nursing notes, pt has been eating 50% of most meals, 75-100% of some meals. Pt's weight has gone up 11 lbs in the past 5 days and glucose has been high at times (up to 415 mg/dL). Pt will likely still meet nutrition needs with Glucerna Shakes (lower carb supplement).   Labs: glucose ranging 65 to 415 mg/dL, low potassium, low chloride, high BUN, low calcium  Diet Order:  Diet Heart Room service appropriate? Yes; Fluid consistency: Thin Diet - low sodium heart healthy  Skin:  Reviewed, no issues  Last BM:  4/13  Height:   Ht Readings from Last 1 Encounters:  12/29/16 6' 2"  (1.88 m)    Weight:   Wt Readings from Last 1 Encounters:  01/03/17 242 lb 8 oz (110 kg)    Ideal Body Weight:  86.36 kg  BMI:  Body mass index is 31.14 kg/m.  Estimated Nutritional Needs:   Kcal:  1900-2100  Protein:  100-125 grams  Fluid:  2.1 L/day  EDUCATION NEEDS:   No education needs identified at this  time  Scarlette Ar RD, LDN, CSP Inpatient Clinical Dietitian Pager: [phone] After Hours Pager: [phone]

## 2017-01-03 NOTE — Progress Notes (Signed)
Report called to Remo Lipps at [facility]. Verbalized understanding.  Karie Kirks, Therapist, sports.

## 2017-01-03 NOTE — Progress Notes (Signed)
Informed Dr. Posey Pronto this am while in progression that noted pt's right foot was cold to touch.  Skin color WNL  Good circulation to toes.  MD says he has been made aware.  Will continue to monitor.  Karie Kirks, Therapist, sports.

## 2017-01-03 NOTE — Progress Notes (Signed)
Pt w/o of burning sensation to chest.  Says it feels like when he swallow it does not go down all the way.  BP152/90, 91.  Will continue to monitor.  Karie Kirks, Therapist, sports.

## 2017-01-03 NOTE — Progress Notes (Signed)
Pt d/c to [facility] at 1641 via ambulance. Karie Kirks, Therapist, sports.

## 2017-01-03 NOTE — Progress Notes (Signed)
Inpatient Diabetes Program Recommendations  AACE/ADA: New Consensus Statement on Inpatient Glycemic Control (2015)  Target Ranges:  Prepandial:   less than 140 mg/dL      Peak postprandial:   less than 180 mg/dL (1-2 hours)      Critically ill patients:  140 - 180 mg/dL   Lab Results  Component Value Date   GLUCAP 134 (H) 01/03/2017   HGBA1C 7.7 (H) 10/30/2016    Review of Glycemic Control Results for William Braun, William Braun (MRN [id number]) as of 01/03/2017 09:30  Ref. Range 01/02/2017 07:46 01/02/2017 11:32 01/02/2017 16:56 01/02/2017 17:12 01/02/2017 21:10 01/03/2017 07:31  Glucose-Capillary Latest Ref Range: 65 - 99 mg/dL 73 170 (H) 415 (H) 395 (H) 321 (H) 134 (H)   Inpatient Diabetes Program Recommendations:  Noted Prednisone decreased from 40 mg to 30 mg. Post prandial CBGs elevated. While steroids continued, please consider -Novolog 3 units tid meal coverage if eats 50%  Thank you, William Roys E. Haaris Metallo, RN, MSN, CDE  Diabetes Coordinator Inpatient Glycemic Control Team Team Pager [phone] (8am-5pm) 01/03/2017 9:32 AM

## 2017-01-03 NOTE — Progress Notes (Signed)
Occupational Therapy Treatment Patient Details Name: William Braun MRN: [id number] DOB: 1932-11-17 Today's Date: 01/03/2017    History of present illness 83 y.o. male with chronic diastolic CHF, atrial fibrillation, diabetes mellitus, chronic kidney disease, sleep apnea, hypertension was brought to the ER after patient was acutely short of breath since last evening around 4 PM. Has been having nonproductive cough. Denies any chest pain. Patient also has been having persistent right wrist pain since last 24 hours. Denies any fall.  Dx of CHF, sepsis, UTI, possible gout R wrist.   OT comments  R hand much improved. Noted MD note said splint for comfort. Encouraged pt to leave splint off for a bit of time to see how if felt.  Pt agreed   Follow Up Recommendations  SNF    Equipment Recommendations  None recommended by OT    Recommendations for Other Services      Precautions / Restrictions Precautions Precautions: Fall Required Braces or Orthoses: Other Brace/Splint Other Brace/Splint: right wrist splint Restrictions Weight Bearing Restrictions: No       Mobility Bed Mobility Overal bed mobility: Needs Assistance Bed Mobility: Supine to Sit     Supine to sit: Min guard;HOB elevated     General bed mobility comments: pt in chair  Transfers Overall transfer level: Needs assistance Equipment used: Rolling walker (2 wheeled) Transfers: Sit to/from [facility] Sit to Stand: Min assist;From elevated surface Stand pivot transfers: Min assist       General transfer comment: not performed    Balance                                           ADL either performed or assessed with clinical judgement   ADL   Eating/Feeding: Set up;Sitting Eating/Feeding Details (indicate cue type and reason): pt able to use R hand to obtain drink and take a sip Grooming: Brushing hair;Sitting Grooming Details (indicate cue type and reason): pt able to use R  hand to perform grooming                                     Vision       Perception     Praxis      Cognition Arousal/Alertness: Awake/alert Behavior During Therapy: WFL for tasks assessed/performed Overall Cognitive Status: Within Functional Limits for tasks assessed                                          Exercises General Exercises - Lower Extremity Ankle Circles/Pumps: AROM;Strengthening;Both;10 reps;Supine Toe Raises: AROM;Strengthening;Both;10 reps;Seated Heel Raises: AROM;Strengthening;Both;10 reps;Seated Hand Exercises Wrist Flexion: PROM;5 reps;Right;Seated Wrist Extension: PROM;Right;5 reps;Seated Digit Composite Flexion: AROM;Right;5 reps;Seated Composite Extension: AROM;Right;5 reps;Seated Digit Composite Abduction: AROM;PROM;Right;5 reps;Seated Digit Composite Adduction: AROM;Right;5 reps;Seated Opposition: AROM;Right;Seated   Shoulder Instructions       General Comments      Pertinent Vitals/ Pain       Pain Assessment: 0-10 Pain Score: 2  Pain Location: r fingers with ROM Pain Descriptors / Indicators: Tender Pain Intervention(s): Monitored during session  Home Living  Prior Functioning/Environment              Frequency  Min 2X/week        Progress Toward Goals  OT Goals(current goals can now be found in the care plan section)  Progress towards OT goals: Progressing toward goals  Acute Rehab OT Goals Patient Stated Goal: decrease pain R wrist  Plan Discharge plan remains appropriate    Co-evaluation                 End of Session    OT Visit Diagnosis: Muscle weakness (generalized) (M62.81) Pain - Right/Left: Right Pain - part of body: Hand   Activity Tolerance Patient tolerated treatment well   Patient Left in chair;with call bell/phone within reach   Nurse Communication Mobility status        Time: 9628-3662 OT  Time Calculation (min): 9 min  Charges: OT General Charges $OT Visit: 1 Procedure OT Treatments $Therapeutic Exercise: 8-22 mins     Dajana Gehrig D 01/03/2017, 10:41 AM

## 2017-01-03 NOTE — Discharge Summary (Addendum)
[facility] Discharge Summary   Patient: William Braun TZG:[id number]   PCP: Leeroy Cha, MD DOB: 30-Sep-1932   Date of admission: 12/28/2016   Date of discharge:  01/03/2017    Discharge Diagnoses:  Principal Problem:   Sepsis ([place]) Active Problems:   Obesity hypoventilation syndrome ([place])   Essential hypertension   ATRIAL FIBRILLATION   CLL (chronic lymphocytic leukemia) (HCC)   Type II diabetes mellitus with neurological manifestations ([place])   Acute on chronic respiratory failure with hypoxia (HCC)   Pain and swelling of right wrist   Malnutrition of moderate degree  Admitted From: SNF Disposition:  SNF  Recommendations for Outpatient Follow-up:  1. Follow-up with PCP in 1-2 weeks. Will need adjustment of his Zaroxolyn dose.    Contact information for follow-up providers    Leeroy Cha, MD. Schedule an appointment as soon as possible for a visit in 1 week(s).   Specialty:  Internal Medicine Contact information: [address] [place] [address] [phone]            Contact information for after-discharge care    Destination    [facility] Follow up.   Specialty:  [place] information: [address] [phone]                 Diet recommendation: Cardiac diet carb modified  Activity: The patient is advised to gradually reintroduce usual activities.  Discharge Condition: good  Code Status: DNR/DNI  History of present illness: As per the H and P dictated on admission, "William Braun is a 83 y.o. male with chronic diastolic CHF, atrial fibrillation, diabetes mellitus, chronic kidney disease, sleep apnea, hypertension was brought to the ER after patient was acutely short of breath since last evening around 4 PM. Has been having nonproductive cough. Denies any chest pain. Patient also has been having persistent right wrist pain since  last 24 hours. Denies any fall."  Hospital Course:  Summary of his active problems in the hospital is as following. Sepsis with cellulitis -History of CLL -Patient presented with fever, tachycardia with shortness of breath requiring BiPAP on admission. -Has been afebrile and leukocytosis/tachycardia imrpoving -Workup shows unremarkable chest x-ray, blood cultures negative to date, UA concerning for possible UTI as well as significant redness and swelling of the right wrist. -No urine culture done, no complaints of dysuria -Started on vancomycin and Zosyn, and transitioned to clindamycin, Last day tomorrow, total 7 day treatment course. MRI shows evidence of cellulitis and myositis. No evidence of arthritis or osteomyelitis. -Continue probiotics  Right wrist gouty arthritis -Severe joint pain, getting better with immobilization with sling 2. -MRI hand: Soft tissue and intramuscular signal abnormalities and edema consistent with soft tissue edema/cellulitis in or my ascites. No abscess identified. Probable tear of the triangular fibrocartilage -Currently on IV clindamycin. -Severely elevated ESR and CRP. -Uric acid 12.5 on admission. -Has chronic kidney disease and therefore unable to use colchicine or NSAIDs. -Placed on oral steroids., We will taper rapidly -Hand/Orthopedics, Dr. Fredna Dow, consulted appreciated-recommended moving wrist bands from under splint -PT, OT consulted and recommended SNF  A. fib with RVR -Likely secondary to sepsis on admission. Currently rate controlled -On Coumadin. -INR supratherapeutic, downt o 3.95 (has received vitamin K) -Continue metoprolol  Dyspnea -Multifactorial causes: sepsis, Afib with RVR, OSA, CHF -patient required bipap on admission and was on 3L [place]. Currently better. Continue Mucinex and incentive spirometry and discharge. telemetry unremarkable,  chest x-ray unremarkable.  OSA -continue C Pap daily at bedtime  Type 2 diabetes  mellitus -Steroid-induced hyperglycemia. -Continue Amaryl, Lantus as well as sliding scale coverage and CBG monitoring   Chronic kidney disease stage III -Creatinine currently 1.37, continue to monitor BMP -Of note patient was given dose of metolazone and furosemide as an outpatient which may have contributed to his hyperuricemia  Chronic diastolic dysfunction -Continue torsemide, patient was on scheduled Zaroxolyn daily and presented with acute on chronic kidney disease. Renal function got better. At present I would continue torsemide and changed Zaroxolyn to once a week. -Monitor intake and output, daily weights  Chronic thrombocytopenia/ CLL -Continue to monitor CBC  Moderate malnutrition -Nutrition consulted appreciated, continue supplements  Chronic venous stasis outpatient ABI ordered.   All other chronic medical condition were stable during the hospitalization.  Patient was seen by physical therapy, who recommended SNF, which was arranged by Education officer, museum and case Freight forwarder. On the day of the discharge the patient's vitals were stable, and no other acute medical condition were reported by patient. the patient was felt safe to be discharge at SNF with therapy.  Procedures and Results:   none  Consultations:  Hand surgery  DISCHARGE MEDICATION: Current Discharge Medication List    START taking these medications   Details  clindamycin (CLEOCIN) 150 MG capsule Take 3 capsules (450 mg total) by mouth every 8 (eight) hours. Qty: 9 capsule, Refills: 0    predniSONE (DELTASONE) 10 MG tablet Take 20m daily for 2days,Take 255mdaily for 3days,Take 1014maily for 3days, then stop. Qty: 15 tablet, Refills: 0      CONTINUE these medications which have CHANGED   Details  feeding supplement, GLUCERNA SHAKE, (GLUCERNA SHAKE) LIQD Take 237 mLs by mouth 3 (three) times daily between meals. Qty: 21 Can, Refills: 0    HYDROcodone-acetaminophen (NORCO/VICODIN) 5-325 MG tablet  Take 1 tablet by mouth every 6 (six) hours as needed for moderate pain or severe pain. Qty: 8 tablet, Refills: 0    metolazone (ZAROXOLYN) 2.5 MG tablet Take 1 tablet (2.5 mg total) by mouth once a week. Qty: 5 tablet, Refills: 0      CONTINUE these medications which have NOT CHANGED   Details  albuterol (VENTOLIN HFA) 108 (90 BASE) MCG/ACT inhaler Inhale 2 puffs into the lungs every 6 (six) hours as needed for wheezing or shortness of breath. Qty: 3 Inhaler, Refills: 1    alum & mag hydroxide-simeth (MAALOX/MYLANTA) 200017-793-90/5ML suspension Take 30 mLs by mouth as needed for indigestion or heartburn. Qty: 355 mL, Refills: 0    antiseptic oral rinse (BIOTENE) LIQD 15 mLs by Mouth Rinse route every 6 (six) hours as needed for dry mouth.    atorvastatin (LIPITOR) 10 MG tablet Take 1 tablet (10 mg total) by mouth daily at 6 PM. Qty: 30 tablet, Refills: 0    bisacodyl (DULCOLAX) 10 MG suppository Place 10 mg rectally daily as needed for moderate constipation.    budesonide-formoterol (SYMBICORT) 160-4.5 MCG/ACT inhaler Inhale 2 puffs into the lungs 2 (two) times daily. 2 puffs twice daily Qty: 3 Inhaler, Refills: 3    divalproex (DEPAKOTE) 250 MG DR tablet Take 250 mg by mouth 2 (two) times daily.    glimepiride (AMARYL) 4 MG tablet Take 8 mg by mouth daily with breakfast.    Insulin Glargine (LANTUS) 100 UNIT/ML Solostar Pen Inject 18 Units into the skin daily at 10 pm. Qty: 15 mL, Refills: 0    ipratropium-albuterol (DUONEB) 0.5-2.5 (  3) MG/3ML SOLN Take 3 mLs by nebulization every 6 (six) hours as needed. Qty: 360 mL, Refills: 6    Liniments (SALONPAS EX) Apply 1 patch topically every 12 (twelve) hours. With 4% lidocaine    magnesium hydroxide (MILK OF MAGNESIA) 400 MG/5ML suspension Take 30 mLs by mouth daily as needed for mild constipation.    Melatonin 3 MG TABS Take 6 mg by mouth at bedtime as needed (sleep).     metoprolol succinate (TOPROL-XL) 25 MG 24 hr tablet Take  2 tablets (50 mg total) by mouth daily. Qty: 60 tablet, Refills: 0    mupirocin ointment (BACTROBAN) 2 % Apply 1 application topically 2 (two) times daily. Qty: 22 g, Refills: 0    nitroGLYCERIN (NITROSTAT) 0.4 MG SL tablet Place 1 tablet (0.4 mg total) under the tongue every 5 (five) minutes as needed for chest pain. Qty: 25 tablet, Refills: 3   Associated Diagnoses: Chronic diastolic heart failure ([place]); Chest pain, unspecified    OXYGEN Inhale 3 L into the lungs continuous.     polyethylene glycol (MIRALAX / GLYCOLAX) packet Take 17 g by mouth daily. Qty: 14 each, Refills: 0    potassium chloride SA (K-DUR,KLOR-CON) 20 MEQ tablet Take 2 tablets (40 mEq total) by mouth 2 (two) times daily. Qty: 120 tablet, Refills: 6    senna-docusate (SENOKOT-S) 8.6-50 MG tablet Take 1 tablet by mouth at bedtime as needed for mild constipation. Qty: 30 tablet, Refills: 0    Sodium Phosphates (RA SALINE ENEMA RE) Place 1 Applicatorful rectally daily as needed (constipation).    tiotropium (SPIRIVA) 18 MCG inhalation capsule Place 1 capsule (18 mcg total) into inhaler and inhale daily. Qty: 90 capsule, Refills: 3    torsemide (DEMADEX) 20 MG tablet Take 3 tablets (60 mg total) by mouth 2 (two) times daily. Qty: 180 tablet, Refills: 6    warfarin (COUMADIN) 3 MG tablet Coumadin 3 mg daily on Monday, Wednesday, Thursday, Friday and Coumadin 4.5 mg daily on Tuesday and Saturdays       Allergies  Allergen Reactions  . Quinine Palpitations and Other (See Comments)    Seizure    Discharge Instructions    Diet - low sodium heart healthy    Complete by:  As directed    Discharge instructions    Complete by:  As directed    It is important that you read following instructions as well as go over your medication list with RN to help you understand your care after this hospitalization.  Discharge Instructions: Please follow-up with PCP in one week  Please request your primary care physician to go  over all Hospital Tests and Procedure/Radiological results at the follow up,  Please get all Hospital records sent to your PCP by signing hospital release before you go home.   Do not drive, operating heavy machinery, perform activities at heights, swimming or participation in water activities or provide baby sitting services while you are on Pain, Sleep and Anxiety Medications; until you have been seen by Primary Care Physician or a Neurologist and advised to do so again. Do not take more than prescribed Pain, Sleep and Anxiety Medications. You were cared for by a hospitalist during your hospital stay. If you have any questions about your discharge medications or the care you received while you were in the hospital after you are discharged, you can call the unit and ask to speak with the hospitalist on call if the hospitalist that took care of you is not  available.  Once you are discharged, your primary care physician will handle any further medical issues. Please note that NO REFILLS for any discharge medications will be authorized once you are discharged, as it is imperative that you return to your primary care physician (or establish a relationship with a primary care physician if you do not have one) for your aftercare needs so that they can reassess your need for medications and monitor your lab values. You Must read complete instructions/literature along with all the possible adverse reactions/side effects for all the Medicines you take and that have been prescribed to you. Take any new Medicines after you have completely understood and accept all the possible adverse reactions/side effects. Wear Seat belts while driving. If you have smoked or chewed Tobacco in the last 2 yrs please stop smoking and/or stop any Recreational drug use.   Increase activity slowly    Complete by:  As directed    Continue wrist splint as tolerated for pain control.   VAS [place] ABI WITH/WO TBI    Complete by:   Jan 03, 2017 (Approximate)    Laterality:  Bilateral   Where should this test be performed:  [facility]   Reason for Exam:  poor pulse bilateraly.   Expected Date:  1 month     Discharge Exam: Filed Weights   01/01/17 0553 01/02/17 0518 01/03/17 0451  Weight: 107.6 kg (237 lb 3.2 oz) 107.3 kg (236 lb 8 oz) 110 kg (242 lb 8 oz)   Vitals:   01/03/17 1006 01/03/17 1131  BP: 113/77 109/69  Pulse: 99 96  Resp:  18  Temp:  97.8 F (36.6 C)   General: Appear in mild distress, no Rash; Oral Mucosa moist. Cardiovascular: S1 and S2 Present, no Murmur, difficult to assess JVD Respiratory: Bilateral Air entry present and Clear to Auscultation, no Crackles, no wheezes Abdomen: Bowel Sound present, Soft and no tenderness Extremities: no Pedal edema, no calf tenderness Neurology: Grossly no focal neuro deficit.  The results of significant diagnostics from this hospitalization (including imaging, microbiology, ancillary and laboratory) are listed below for reference.    Significant Diagnostic Studies: Dg Chest 2 View  Result Date: 12/26/2016 CLINICAL DATA:  Two months of dyspnea. Former smoker. History of CHF, COPD, hypertension, chronic lymphocytic leukemia. EXAM: CHEST  2 VIEW COMPARISON:  Chest x-ray of November 16, 2016 FINDINGS: The left lung is well-expanded. There is patchy density in the right perihilar region. There is chronic elevation of the right hemidiaphragm. The heart is top-normal in size. The pulmonary vascularity is prominent centrally. The interstitial markings of both lungs are mildly increased. The bony thorax exhibits no acute abnormality. IMPRESSION: Mildly increased interstitial markings with prominent pulmonary vascularity and cardiac silhouette suggests mild CHF. There may be subsegmental atelectasis or early infiltrate in the right perihilar region. Followup PA and lateral chest X-ray is recommended in 3-4 weeks following trial of antibiotic therapy to ensure resolution and exclude  underlying malignancy. Electronically Signed   By: David  Martinique M.D.   On: 12/26/2016 16:33   Dg Wrist Complete Right  Result Date: 12/29/2016 CLINICAL DATA:  Acute onset of right wrist pain and swelling. Initial encounter. EXAM: RIGHT WRIST - COMPLETE 3+ VIEW COMPARISON:  None. FINDINGS: There is no evidence of fracture or dislocation. Mild degenerative change is noted about the distal first metacarpal, with osteophyte formation and subcortical cyst formation. Mild degenerative change is noted about the first interphalangeal joint. The carpal rows are intact, and demonstrate  normal alignment. The joint spaces are preserved. Diffuse vascular calcifications are seen. Diffuse soft tissue swelling is noted about the wrist. IMPRESSION: 1. No evidence of fracture or dislocation. 2. Mild degenerative change about the thumb. 3. Diffuse vascular calcifications seen. Electronically Signed   By: Garald Balding M.D.   On: 12/29/2016 00:25   Mr Hand Right Wo Contrast  Result Date: 12/31/2016 CLINICAL DATA:  Diffuse swelling and pain of the right wrist EXAM: MRI OF THE RIGHT HAND WITHOUT CONTRAST TECHNIQUE: Multiplanar, multisequence MR imaging of the right wrist was performed. No intravenous contrast was administered. COMPARISON:  Radiographs from 12/29/2016 FINDINGS: Technologist notes that the patient had a difficult time breathing during the exam. Only a limited amount of imaging was acquired before study had to be terminated with axial T1, axial T2 fat sat and STIR images of the wrist and metacarpals acquired. There is diffuse soft tissue edema of the visualized hand and wrist consistent with cellulitis. No focal abscess collection is noted. Intramuscular edema is also noted which may represent a diffuse myositis. Subcortical degenerative cystic changes of the distal radius and ulna at the distal radioulnar joint, intercarpal, heads of the first and second metacarpals and head of first proximal phalanx are noted.  No acute fracture or dislocations. No evidence of pyomyositis. Small amount of fluid in the distal radioulnar joint likely represent a tear through the triangular fibrocartilage complex. No abnormal signal abnormalities of the extensor and flexor tendons crossing the wrist. The flexor retinaculum is not thickened in appearance. IMPRESSION: Soft tissue and intramuscular signal abnormalities and edema consistent with soft tissue edema/cellulitis and/or myositis. No focal abscess identified. Osteoarthritic sub cortical cystic change as above. No signal abnormalities of the flexor nor extensor tendons crossing the wrist. Probable tear of the triangular fibrocartilage complex given fluid in the distal radioulnar joint. Electronically Signed   By: Ashley Royalty M.D.   On: 12/31/2016 02:09   Dg Chest Port 1 View  Result Date: 01/02/2017 CLINICAL DATA:  Shortness of breath, acute on chronic. Productive cough. EXAM: PORTABLE CHEST 1 VIEW COMPARISON:  12/28/2016 FINDINGS: There is chronic elevation of the right diaphragm. There is no focal parenchymal opacity. There is no pleural effusion or pneumothorax. The heart and mediastinal contours are unremarkable. The osseous structures are unremarkable. IMPRESSION: No active disease. Electronically Signed   By: Kathreen Devoid   On: 01/02/2017 11:17   Dg Chest Portable 1 View  Result Date: 12/28/2016 CLINICAL DATA:  Dyspnea, history of atrial fibrillation, COPD and CHF with hypertension and diabetes. EXAM: PORTABLE CHEST 1 VIEW COMPARISON:  12/26/2016 FINDINGS: The heart is top-normal in size. Patient is slightly rotated to the right on current exam. No aortic aneurysm is identified. Chronic elevation of the right hemidiaphragm versus eventration is noted with adjacent atelectasis. Suggestion of mild interstitial edema given increased interstitial lung markings. No effusion or pneumothorax. No acute nor suspicious osseous abnormalities. Chronic degenerative changes are noted  along the [facility] and glenohumeral joints. IMPRESSION: Mild interstitial edema suggested. No pneumonic consolidation. Stable borderline cardiomegaly. Electronically Signed   By: Ashley Royalty M.D.   On: 12/28/2016 20:22    Microbiology: Recent Results (from the past 240 hour(s))  Culture, blood (routine x 2)     Status: None   Collection Time: 12/28/16  7:55 PM  Result Value Ref Range Status   Specimen Description BLOOD LEFT FOREARM  Final   Special Requests   Final    BOTTLES DRAWN AEROBIC AND ANAEROBIC Blood Culture adequate  volume   Culture NO GROWTH 5 DAYS  Final   Report Status 01/02/2017 FINAL  Final  Culture, blood (routine x 2)     Status: None   Collection Time: 12/28/16  8:25 PM  Result Value Ref Range Status   Specimen Description BLOOD RIGHT HAND  Final   Special Requests   Final    BOTTLES DRAWN AEROBIC AND ANAEROBIC Blood Culture adequate volume   Culture NO GROWTH 5 DAYS  Final   Report Status 01/02/2017 FINAL  Final  MRSA PCR Screening     Status: None   Collection Time: 12/29/16  5:28 AM  Result Value Ref Range Status   MRSA by PCR NEGATIVE NEGATIVE Final    Comment:        The GeneXpert MRSA Assay (FDA approved for NASAL specimens only), is one component of a comprehensive MRSA colonization surveillance program. It is not intended to diagnose MRSA infection nor to guide or monitor treatment for MRSA infections.      Labs: CBC:  Recent Labs Lab 12/28/16 1955 12/29/16 0231 12/30/16 0350 12/31/16 0322 01/01/17 0526 01/02/17 0346 01/03/17 0404  WBC 24.6* 21.9* 20.9* 23.8* 21.7* 20.0* 21.7*  NEUTROABS 11.8* 10.3* 11.3* 14.0*  --   --   --   HGB 13.8 13.0 11.9* 12.2* 12.9* 13.8 14.6  HCT 42.3 40.3 36.7* 37.0* 39.3 41.8 43.9  MCV 93.8 94.4 92.7 92.0 92.0 91.7 92.0  PLT 148* 143* 138* 172 199 192 549   Basic Metabolic Panel:  Recent Labs Lab 12/30/16 0350 12/31/16 0322 01/01/17 0526 01/02/17 0346 01/03/17 0404  NA 139 135 139 139 140  K 4.0 4.0  3.3* 3.3* 3.4*  CL 93* 88* 88* 90* 86*  CO2 36* 36* 41* 41* 44*  GLUCOSE 175* 276* 164* 123* 173*  BUN 48* 60* 65* 51* 57*  CREATININE 1.99* 1.66* 1.37* 1.16 1.28*  CALCIUM 8.3* 8.5* 8.6* 8.7* 8.9  MG 2.2 2.6*  --   --  2.3   Liver Function Tests:  Recent Labs Lab 12/28/16 1955 12/29/16 0231 12/30/16 0350 12/31/16 0322  AST 30 23 18 19   ALT 16* 14* 11* 14*  ALKPHOS 88 76 69 79  BILITOT 0.7 1.1 0.6 0.5  PROT 7.1 6.5 6.2* 6.4*  ALBUMIN 2.7* 2.4* 2.1* 2.0*   No results for input(s): LIPASE, AMYLASE in the last 168 hours. No results for input(s): AMMONIA in the last 168 hours. Cardiac Enzymes:  Recent Labs Lab 12/29/16 0231 12/29/16 0814 12/29/16 1448  TROPONINI 0.05* 0.08* 0.06*   BNP (last 3 results)  Recent Labs  11/14/16 0353 12/26/16 1449 12/28/16 1955  BNP 89.0 112.8* 141.7*   CBG:  Recent Labs Lab 01/02/17 1656 01/02/17 1712 01/02/17 2110 01/03/17 0731 01/03/17 1135  GLUCAP 415* 395* 321* 134* 143*   Time spent: 35 minutes  Signed:  Zakiyyah Savannah  [facility]  01/03/2017  , 1:11 PM

## 2017-01-03 NOTE — Progress Notes (Signed)
Pt called RN at 6.45am complained cannot breathe, and nothing coming through CPAP, he cannot feel anything, RT called, SPO2 is 100%.  RT switched CPAP to [place] @3l /min, oxygen sat is 100%, will continue to monitor

## 2017-01-03 NOTE — Progress Notes (Signed)
Placed patient on CPAP for the night with oxygen set at 3lpm.  

## 2017-01-03 NOTE — Progress Notes (Signed)
ANTICOAGULATION CONSULT NOTE - Follow Up Consult  Pharmacy Consult:  Coumadin Indication: atrial fibrillation  Allergies  Allergen Reactions  . Quinine Palpitations and Other (See Comments)    Seizure     Patient Measurements: Height: 6\' 2"  (188 cm) Weight: 242 lb 8 oz (110 kg) IBW/kg (Calculated) : 82.2  Vital Signs: Temp: 98 F (36.7 C) (04/17 0451) Temp Source: Axillary (04/17 0451) BP: 115/87 (04/17 0451) Pulse Rate: 73 (04/17 0451)  Labs:  Recent Labs  01/01/17 0526 01/02/17 0346 01/03/17 0404  HGB 12.9* 13.8 14.6  HCT 39.3 41.8 43.9  PLT 199 192 173  LABPROT 39.6* 27.6* 22.1*  INR 3.95 2.51 1.90  CREATININE 1.37* 1.16 1.28*    Estimated Creatinine Clearance: 59.7 mL/min (A) (by C-G formula based on SCr of 1.28 mg/dL (H)).     Assessment: 19 YOM with history of AFib on Coumadin PTA.  Coumadin was reversed with Vitamin K on 12/30/16 for possible hand surgery.  Did not proceed with surgery and Coumadin resumed on 01/01/17.  INR decreased further today; no bleeding reported.     Goal of Therapy:  INR 2-3    Plan:  - Coumadin 4.5mg  PO today - Daily PT / INR - F/U KCL supplementation, CBGs   Coraleigh Sheeran D. Mina Marble, PharmD, BCPS Pager:  [phone] 01/03/2017, 7:48 AM

## 2017-01-03 NOTE — Progress Notes (Signed)
Physical Therapy Treatment Patient Details Name: William Braun MRN: [id number] DOB: 07/12/1933 Today's Date: 01/03/2017    History of Present Illness 83 y.o. male with chronic diastolic CHF, atrial fibrillation, diabetes mellitus, chronic kidney disease, sleep apnea, hypertension was brought to the ER after patient was acutely short of breath since last evening around 4 PM. Has been having nonproductive cough. Denies any chest pain. Patient also has been having persistent right wrist pain since last 24 hours. Denies any fall.  Dx of CHF, sepsis, UTI, possible gout R wrist.    PT Comments    Pt was able to progress mobility today with OOB transfer to chair and overall less assistance needed. Acute PT to continue during pt's hospital stay.   Follow Up Recommendations  SNF;Supervision for mobility/OOB     Equipment Recommendations  None recommended by PT    Recommendations for Other Services OT consult     Precautions / Restrictions Precautions Precautions: Fall Required Braces or Orthoses: Other Brace/Splint Other Brace/Splint: right wrist splint Restrictions Weight Bearing Restrictions: No    Mobility  Bed Mobility Overal bed mobility: Needs Assistance Bed Mobility: Supine to Sit     Supine to sit: Min guard;HOB elevated     General bed mobility comments: rail used with HOB elevated to about 40 degrees. increased time needed, no physical assistance needed  Transfers Overall transfer level: Needs assistance Equipment used: Rolling walker (2 wheeled) Transfers: Sit to/from [facility] Sit to Stand: Min assist;From elevated surface Stand pivot transfers: Min assist       General transfer comment: bed elevated to assist with standing. cues needed for safe hand placement with transfers. min assist with cues on sequencing with RW for pivot transfer from bed to chair.          Cognition Arousal/Alertness: Awake/alert Behavior During Therapy: WFL for  tasks assessed/performed Overall Cognitive Status: Within Functional Limits for tasks assessed        Exercises General Exercises - Lower Extremity Ankle Circles/Pumps: AROM;Strengthening;Both;10 reps;Supine Toe Raises: AROM;Strengthening;Both;10 reps;Seated Heel Raises: AROM;Strengthening;Both;10 reps;Seated     Pertinent Vitals/Pain Pain Assessment: 0-10 Pain Score: 4  Pain Location: R wrist/fingers  with ROM (pain much less at rest) Pain Descriptors / Indicators: Discomfort;Dull Pain Intervention(s): Monitored during session;Repositioned     PT Goals (current goals can now be found in the care plan section) Acute Rehab PT Goals Patient Stated Goal: decrease pain R wrist PT Goal Formulation: With patient/family Time For Goal Achievement: 01/13/17 Potential to Achieve Goals: Fair Progress towards PT goals: Progressing toward goals    Frequency    Min 3X/week      PT Plan Current plan remains appropriate    End of Session Equipment Utilized During Treatment: Gait belt;Oxygen Activity Tolerance: Patient tolerated treatment well;No increased pain;Patient limited by fatigue Patient left: in chair;with call bell/phone within reach Nurse Communication: Mobility status PT Visit Diagnosis: Unsteadiness on feet (R26.81);Other abnormalities of gait and mobility (R26.89);Muscle weakness (generalized) (M62.81);Difficulty in walking, not elsewhere classified (R26.2);Pain Pain - Right/Left: Right Pain - part of body: Hand     Time: 4562-5638 PT Time Calculation (min) (ACUTE ONLY): 21 min  Charges:  $Therapeutic Activity: 8-22 mins           Willow Ora, PTA, [facility] Acute Rehab Services Office- [phone] 01/03/17, 10:13 AM  Willow Ora 01/03/2017, 10:12 AM

## 2017-01-03 NOTE — Progress Notes (Signed)
Pt slept well overnight on CPAP oxygen seet @3l /min. Bathing done and Condom Cath changed, no any complain of pain, oral clindamycin started, vitals stable, will continue to monitor

## 2017-01-04 ENCOUNTER — Other Ambulatory Visit: Payer: Self-pay | Admitting: *Deleted

## 2017-01-04 MED ORDER — HYDROCODONE-ACETAMINOPHEN 5-325 MG PO TABS: 1.0000 | ORAL_TABLET | Freq: Four times a day (QID) | ORAL | 0 refills | Status: DC | PRN

## 2017-01-04 NOTE — Telephone Encounter (Signed)
[facility] [phone] Fax: [phone]  

## 2017-01-05 ENCOUNTER — Non-Acute Institutional Stay (SKILLED_NURSING_FACILITY): Payer: PPO | Admitting: Internal Medicine

## 2017-01-05 ENCOUNTER — Telehealth: Payer: Self-pay | Admitting: Pulmonary Disease

## 2017-01-05 ENCOUNTER — Encounter: Payer: Self-pay | Admitting: Internal Medicine

## 2017-01-05 DIAGNOSIS — M109 Gout, unspecified: Secondary | ICD-10-CM | POA: Diagnosis not present

## 2017-01-05 DIAGNOSIS — A419 Sepsis, unspecified organism: Secondary | ICD-10-CM

## 2017-01-05 DIAGNOSIS — B370 Candidal stomatitis: Secondary | ICD-10-CM | POA: Diagnosis not present

## 2017-01-05 MED ORDER — FEBUXOSTAT 40 MG PO TABS: 40.0000 mg | ORAL_TABLET | Freq: Every day | ORAL | Status: DC

## 2017-01-05 NOTE — Patient Instructions (Signed)
See assessment and plan under each diagnosis in the problem list and acutely for this visit 

## 2017-01-05 NOTE — Assessment & Plan Note (Signed)
01/05/17 clinically not septic, but profoundly debilitated PT/OT as tolerated

## 2017-01-05 NOTE — Assessment & Plan Note (Signed)
Nonsteroidals and allopurinol relatively contra indicated due to kidney disease Avoid high fructose corn syrup sugar Trial of Uloric

## 2017-01-05 NOTE — Telephone Encounter (Signed)
Checked VS's lookat and there is not a form on this pt  They may have sent this while the phones were down  [facility] and after being placed on hold for 8 minutes, had to [facility] for [place] TCB

## 2017-01-05 NOTE — Progress Notes (Signed)
[facility] Room: 107  PCP:  Leeroy Cha, MD [address] [place] [address]   This is a nursing facility follow up  For [place] readmission within 30 days  Interim medical record and care since last [place] visit was updated with review of diagnostic studies and change in clinical status since last visit were documented.  HPI: 83 y/o M with a history of HTN, atrial fibrillation, chronic lymphocytic leukemia, DM, OSA, and obesity hypoventilation syndrome hospitalized 4/11-4/17/18 for sepsis with  dyspnea requiring BiPap .  CXR was unremarkable and blood cultures were negative.  UA was concerning for UTI, but cultures were not obtained due to absence of urinary symptoms.  He was treated with Vancomycin and Zosyn and transitioned to PO Clindamycin for a 7-day course that is completed.      He had complaints of right wrist pain with swelling and redness and a Uric Acid of 12.5 on admission.  Dr. Fredna Dow, Orthopedist, was consulted and recommended moving wrist bands from under splint.  MRI showed no evidence of cellulitis, arthritis, or osteomyelitis. He received a burst of oral steroids. Patient was in a-fib with RVR on admission likely secondary to sepsis. Currently rate controlled on metoprolol.  Patient is on Coumadin.   01/03/17 creatinine 1.28 ; GFR 51; white count 21,700 which is stable.  Review of systems:  Reports breathing is "heavy"  Reports dyspnea during physical therapy and toileting.  Chronic cough with small amount of white to grey sputum production.  Reports a sharp, intermittent pain in mid chest that has been occurring for one month and is worsened by coughing.   Reports compliance with CPAP machine.  Ongoing xerostomia that interferes with compliance to fluid restriction. States Biotene is helping.    Constitutional: No fever,significant weight change, fatigue  Eyes: No redness, discharge, pain,  vision change ENT/mouth: No nasal congestion,  purulent discharge, earache,change in hearing ,sore throat  Cardiovascular: No palpitations,paroxysmal nocturnal dyspnea, claudication, edema  Respiratory: No hemoptysis, significant snoring,apnea   Gastrointestinal: No heartburn,dysphagia,abdominal pain, nausea / vomiting,rectal bleeding, melena,change in bowels, diarrhea  Genitourinary: No dysuria,hematuria, pyuria,  incontinence, nocturia Musculoskeletal: No joint stiffness, joint swelling, weakness,pain Dermatologic: No rash, pruritus, change in appearance of skin Neurologic: No dizziness,headache,syncope, seizures, numbness , tingling Psychiatric: No significant anxiety , depression, insomnia, anorexia Endocrine: No change in hair/skin/ nails, excessive thirst, excessive hunger, excessive urination  Hematologic/lymphatic: No significant bruising, lymphadenopathy,abnormal bleeding Allergy/immunology: No itchy/ watery eyes, significant sneezing, urticaria, angioedema  Physical exam:  Pertinent or positive findings:OS ptosis. Tachypnea with inability to finish long sentences due to dyspnea. O2 sats 85 post ambulation 15 feet. Missing and cracked teeth.  Upper palate and tonsils erythematous with small amount of yellow exudate.   Heart sounds are distant and irregular. Lungs diminished in bases bilaterally. Mild rales & rhonchi  Skin tear with sanguinous drainage to left elbow covered in clear dressing. Marked scattered bruising BUE with some scattered eschar. Right wrist in splint.  Extremity with no visible edema or erythema.   Purplish hyperpigmentation of bilateral lower extremities, R> L.Clubbing and OA changes. Flexion 5th digit. . Atrophy L calf.  Decreased pedal pulses bilaterally.   General appearance:Adequately nourished; no acute distress , increased work of breathing is present.   Lymphatic: No lymphadenopathy about the head, neck, axilla . Eyes: No conjunctival inflammation or  lid edema is present. There is no scleral icterus. Ears:  External ear exam shows no significant lesions  or deformities.   Nose:  External nasal examination shows no deformity or inflammation. Nasal mucosa are pink and moist without lesions ,exudates Oral exam: lips and gums are healthy appearing.There is no oropharyngeal erythema or exudate . Neck:  No thyromegaly, masses, tenderness noted.    Heart:  S1 and S2 normal without gallop, murmur, click, rub .  Abdomen:Bowel sounds are normal. Abdomen is soft and nontender with no organomegaly, hernias,masses. GU: deferred  Extremities:  No cyanosis Neurologic exam : Strength equal  in upper & lower extremities Balance,Rhomberg,finger to nose testing could not be completed due to clinical state Skin: Warm & dry w/o tenting. No significant lesions or rash.  See summary under each active problem in the Problem List with associated updated therapeutic plan  #1) Congestive Heart Failure- Currently taking Torsemide and metolazone.  During hospitalization, metolazone was decreased from daily to 2.5mg  once weekly due to kidney function and gout.  Monitor BMP and fluid status.  Encouraged compliance with fluid restriction.   #2) Right wrist gouty arthritis- unable to take colchicine or NSAIDS due to kidney function.  Prednisone taper given to total 8 days (complete today).  Monitor CBG. Restrict HFCS. Uloric 40 mg qd  #3) Oral Thrush- Nystatin swish and swallow 500,000 Units PO QID.

## 2017-01-06 NOTE — Telephone Encounter (Signed)
Called [facility] and after being placed on hold for 15 minutes, I had to leave vm for Moshe Cipro to return our call. Will await call back.

## 2017-01-09 ENCOUNTER — Ambulatory Visit: Payer: PPO | Admitting: Pulmonary Disease

## 2017-01-09 DIAGNOSIS — S61201D Unspecified open wound of left index finger without damage to nail, subsequent encounter: Secondary | ICD-10-CM | POA: Diagnosis not present

## 2017-01-09 DIAGNOSIS — S61201A Unspecified open wound of left index finger without damage to nail, initial encounter: Secondary | ICD-10-CM | POA: Diagnosis not present

## 2017-01-10 NOTE — Telephone Encounter (Signed)
lmtcb for [facility] with [facility]. The receptionist states she will call us back today. Will sign off on message if another VM is left.

## 2017-01-10 NOTE — Telephone Encounter (Signed)
Moshe Cipro [facility]) returned phone call..will resend form

## 2017-01-10 NOTE — Telephone Encounter (Signed)
Will await form

## 2017-01-12 ENCOUNTER — Ambulatory Visit: Payer: PPO | Admitting: Podiatry

## 2017-01-16 DIAGNOSIS — S61201A Unspecified open wound of left index finger without damage to nail, initial encounter: Secondary | ICD-10-CM | POA: Diagnosis not present

## 2017-01-16 DIAGNOSIS — G4733 Obstructive sleep apnea (adult) (pediatric): Secondary | ICD-10-CM | POA: Diagnosis not present

## 2017-01-16 DIAGNOSIS — R0602 Shortness of breath: Secondary | ICD-10-CM | POA: Diagnosis not present

## 2017-01-16 DIAGNOSIS — J9610 Chronic respiratory failure, unspecified whether with hypoxia or hypercapnia: Secondary | ICD-10-CM | POA: Diagnosis not present

## 2017-01-16 DIAGNOSIS — S61201D Unspecified open wound of left index finger without damage to nail, subsequent encounter: Secondary | ICD-10-CM | POA: Diagnosis not present

## 2017-01-16 LAB — POCT INR: INR: 2.5 — AB (ref 0.9–1.1)

## 2017-01-16 LAB — PROTIME-INR: PROTIME: 26.6 s — AB (ref 10.0–13.8)

## 2017-01-16 NOTE — Telephone Encounter (Signed)
Did not see these forms in VS' look-at.   Ashtyn please advise if you've received forms on this pt, or if they need to be re-requested.  Thanks!

## 2017-01-17 DIAGNOSIS — I5032 Chronic diastolic (congestive) heart failure: Secondary | ICD-10-CM | POA: Diagnosis not present

## 2017-01-17 DIAGNOSIS — Z7901 Long term (current) use of anticoagulants: Secondary | ICD-10-CM | POA: Diagnosis not present

## 2017-01-17 DIAGNOSIS — M6088 Other myositis, other site: Secondary | ICD-10-CM | POA: Diagnosis not present

## 2017-01-17 DIAGNOSIS — M6281 Muscle weakness (generalized): Secondary | ICD-10-CM | POA: Diagnosis not present

## 2017-01-17 DIAGNOSIS — I130 Hypertensive heart and chronic kidney disease with heart failure and stage 1 through stage 4 chronic kidney disease, or unspecified chronic kidney disease: Secondary | ICD-10-CM | POA: Diagnosis not present

## 2017-01-17 DIAGNOSIS — E1149 Type 2 diabetes mellitus with other diabetic neurological complication: Secondary | ICD-10-CM | POA: Diagnosis not present

## 2017-01-17 DIAGNOSIS — A419 Sepsis, unspecified organism: Secondary | ICD-10-CM | POA: Diagnosis not present

## 2017-01-17 DIAGNOSIS — Z794 Long term (current) use of insulin: Secondary | ICD-10-CM | POA: Diagnosis not present

## 2017-01-17 DIAGNOSIS — E440 Moderate protein-calorie malnutrition: Secondary | ICD-10-CM | POA: Diagnosis not present

## 2017-01-17 DIAGNOSIS — N183 Chronic kidney disease, stage 3 (moderate): Secondary | ICD-10-CM | POA: Diagnosis not present

## 2017-01-17 DIAGNOSIS — C9510 Chronic leukemia of unspecified cell type not having achieved remission: Secondary | ICD-10-CM | POA: Diagnosis not present

## 2017-01-17 DIAGNOSIS — L03113 Cellulitis of right upper limb: Secondary | ICD-10-CM | POA: Diagnosis not present

## 2017-01-17 DIAGNOSIS — E662 Morbid (severe) obesity with alveolar hypoventilation: Secondary | ICD-10-CM | POA: Diagnosis not present

## 2017-01-17 DIAGNOSIS — I4891 Unspecified atrial fibrillation: Secondary | ICD-10-CM | POA: Diagnosis not present

## 2017-01-17 DIAGNOSIS — R488 Other symbolic dysfunctions: Secondary | ICD-10-CM | POA: Diagnosis not present

## 2017-01-17 DIAGNOSIS — D696 Thrombocytopenia, unspecified: Secondary | ICD-10-CM | POA: Diagnosis not present

## 2017-01-17 DIAGNOSIS — M109 Gout, unspecified: Secondary | ICD-10-CM | POA: Diagnosis not present

## 2017-01-17 DIAGNOSIS — G4730 Sleep apnea, unspecified: Secondary | ICD-10-CM | POA: Diagnosis not present

## 2017-01-18 ENCOUNTER — Encounter: Payer: Self-pay | Admitting: Nurse Practitioner

## 2017-01-18 ENCOUNTER — Non-Acute Institutional Stay (SKILLED_NURSING_FACILITY): Payer: PPO | Admitting: Nurse Practitioner

## 2017-01-18 DIAGNOSIS — R21 Rash and other nonspecific skin eruption: Secondary | ICD-10-CM

## 2017-01-18 DIAGNOSIS — Z7901 Long term (current) use of anticoagulants: Secondary | ICD-10-CM | POA: Diagnosis not present

## 2017-01-18 DIAGNOSIS — M109 Gout, unspecified: Secondary | ICD-10-CM | POA: Diagnosis not present

## 2017-01-18 MED ORDER — ALLOPURINOL 100 MG PO TABS: 100.0000 mg | ORAL_TABLET | Freq: Every day | ORAL | 6 refills | Status: AC

## 2017-01-18 NOTE — Telephone Encounter (Signed)
I have checked the CMN's that I have for [name] and nothing on this patient

## 2017-01-18 NOTE — Progress Notes (Signed)
Patient ID: William Braun, male   DOB: Mar 09, 1933, 83 y.o.   MRN: [id number]      Nursing Home Location:    [facility]  Place of Service: SNF (31)  PCP: Leeroy Cha, MD  Allergies  Allergen Reactions  . Quinine Palpitations and Other (See Comments)    Seizure     Chief Complaint  Patient presents with  . Acute Visit    rash    HPI:  Patient is a 83 y.o. male seen today at  [facility] as an acute visit for rash. Pt with hx of chronic diastolic CHF, atrial fibrillation, diabetes mellitus, chronic kidney disease, sleep apnea, hypertension. Pt was started on uloric after elevated uric acid lever and acute gout in right. Pt noted to have itchy rash to back extending around truck to sides. Uloric was stopped and prednisone 10 mg BID was added. Rash has improved but pt still itching with faint rash present.   Review of Systems:  Review of Systems  Constitutional: Negative for activity change, appetite change, chills, diaphoresis, fatigue, fever and unexpected weight change.  HENT: Negative for congestion, postnasal drip, sinus pain, sinus pressure and sore throat.   Eyes: Negative.   Respiratory: Negative for cough, chest tightness, shortness of breath and wheezing.        Shortness of breath, cough and congestion at baseline.   Cardiovascular: Positive for leg swelling. Negative for chest pain and palpitations.       Left greater than right lower extremity edema  Gastrointestinal: Negative for abdominal pain, constipation, diarrhea, nausea and vomiting.  Musculoskeletal: Negative for arthralgias, joint swelling and myalgias.  Skin: Positive for rash.  Neurological: Negative for syncope, light-headedness and headaches.  Psychiatric/Behavioral: Negative for agitation and behavioral problems. Confusion: at times. The patient is not nervous/anxious.     Past Medical History:  Diagnosis Date  . Atrial fibrillation ([place])   . Cerebrovascular disease   . CHF (congestive heart  failure) ([place])   . CLL (chronic lymphocytic leukemia) ([place]) 11/17/2014  . COPD (chronic obstructive pulmonary disease) ([place])   . History of thrombocytopenia   . Obesity   . Osteoarthritis   . Other and unspecified hyperlipidemia   . Type II or unspecified type diabetes mellitus without mention of complication, not stated as uncontrolled   . Unspecified essential hypertension    Past Surgical History:  Procedure Laterality Date  . KNEE ARTHROSCOPY W/ ALLOGRAFT IMPANT    . VASECTOMY     Social History:   reports that he quit smoking about 38 years ago. His smoking use included Cigarettes. He started smoking about 70 years ago. He has a 68.00 pack-year smoking history. He has never used smokeless tobacco. He reports that he does not drink alcohol. His drug history is not on file.  Family History  Problem Relation Age of Onset  . Heart disease Father     Medications: Patient's Medications  New Prescriptions   No medications on file  Previous Medications   ALBUTEROL (VENTOLIN HFA) 108 (90 BASE) MCG/ACT INHALER    Inhale 2 puffs into the lungs every 6 (six) hours as needed for wheezing or shortness of breath.   ALUMINUM-MAGNESIUM HYDROXIDE-SIMETHICONE (MAALOX) 563-875-64 MG/5ML SUSP    Take 30 mLs by mouth every 4 (four) hours as needed (INDIGESTION OR HEARTBURN).   ANTISEPTIC ORAL RINSE (BIOTENE) LIQD    15 mLs by Mouth Rinse route every 6 (six) hours as needed for dry mouth.   ATORVASTATIN (LIPITOR) 10 MG TABLET  Take 1 tablet (10 mg total) by mouth daily at 6 PM.   BISACODYL (DULCOLAX) 10 MG SUPPOSITORY    Place 10 mg rectally daily as needed for moderate constipation.   BUDESONIDE-FORMOTEROL (SYMBICORT) 160-4.5 MCG/ACT INHALER    Inhale 2 puffs into the lungs 2 (two) times daily. 2 puffs twice daily   DIVALPROEX (DEPAKOTE) 250 MG DR TABLET    Take 250 mg by mouth 2 (two) times daily.   FEEDING SUPPLEMENT, GLUCERNA SHAKE, (GLUCERNA SHAKE) LIQD    Take 237 mLs by mouth 3 (three) times  daily between meals.   GLIMEPIRIDE (AMARYL) 4 MG TABLET    Take 8 mg by mouth daily with breakfast.   HYDROCODONE-ACETAMINOPHEN (NORCO/VICODIN) 5-325 MG TABLET    Take 1 tablet by mouth every 8 (eight) hours as needed for moderate pain.   HYDROXYZINE (ATARAX/VISTARIL) 10 MG TABLET    Take 10 mg by mouth every 8 (eight) hours as needed for itching.   INSULIN GLARGINE (LANTUS) 100 UNIT/ML SOLOSTAR PEN    Inject 18 Units into the skin daily at 10 pm.   IPRATROPIUM-ALBUTEROL (DUONEB) 0.5-2.5 (3) MG/3ML SOLN    Take 3 mLs by nebulization every 6 (six) hours as needed (COPD).   LINIMENTS (SALONPAS EX)    Apply 1 patch topically every 12 (twelve) hours. With 4% lidocaine   MAGNESIUM HYDROXIDE (MILK OF MAGNESIA) 400 MG/5ML SUSPENSION    Take 30 mLs by mouth daily as needed for mild constipation.   MELATONIN 3 MG TABS    Take 6 mg by mouth at bedtime as needed (sleep).    METOLAZONE (ZAROXOLYN) 2.5 MG TABLET    Take 1 tablet (2.5 mg total) by mouth once a week.   METOPROLOL SUCCINATE (TOPROL-XL) 25 MG 24 HR TABLET    Take 2 tablets (50 mg total) by mouth daily.   MUPIROCIN OINTMENT (BACTROBAN) 2 %    Apply 1 application topically 2 (two) times daily.   NITROGLYCERIN (NITROSTAT) 0.4 MG SL TABLET    Place 1 tablet (0.4 mg total) under the tongue every 5 (five) minutes as needed for chest pain.   POLYETHYLENE GLYCOL (MIRALAX / GLYCOLAX) PACKET    Take 17 g by mouth daily.   POTASSIUM CHLORIDE SA (K-DUR,KLOR-CON) 20 MEQ TABLET    Take 2 tablets (40 mEq total) by mouth 2 (two) times daily.   SENNA-DOCUSATE (SENOKOT-S) 8.6-50 MG TABLET    Take 1 tablet by mouth at bedtime as needed for mild constipation.   SODIUM PHOSPHATES (RA SALINE ENEMA RE)    Place 1 Applicatorful rectally daily as needed (constipation).   TIOTROPIUM (SPIRIVA) 18 MCG INHALATION CAPSULE    Place 1 capsule (18 mcg total) into inhaler and inhale daily.   TORSEMIDE (DEMADEX) 20 MG TABLET    Take 3 tablets (60 mg total) by mouth 2 (two) times  daily.   WARFARIN (COUMADIN) 3 MG TABLET      Modified Medications   No medications on file  Discontinued Medications   ALUM & MAG HYDROXIDE-SIMETH (MAALOX/MYLANTA) 200-200-20 MG/5ML SUSPENSION    Take 30 mLs by mouth as needed for indigestion or heartburn.   CLINDAMYCIN (CLEOCIN) 150 MG CAPSULE    Take 450 mg by mouth every 8 (eight) hours.   HYDROCODONE-ACETAMINOPHEN (NORCO/VICODIN) 5-325 MG TABLET    Take 1 tablet by mouth every 6 (six) hours as needed for moderate pain or severe pain.   IPRATROPIUM-ALBUTEROL (DUONEB) 0.5-2.5 (3) MG/3ML SOLN    Take 3 mLs by nebulization every  6 (six) hours as needed.   OXYGEN    Inhale 3 L into the lungs continuous.    PREDNISONE (DELTASONE) 10 MG TABLET    Take 30mg  daily for 2days,Take 20mg  daily for 3days,Take 10mg  daily for 3days, then stop.   SACCHAROMYCES BOULARDII (FLORASTOR) 250 MG CAPSULE    Take 250 mg by mouth 2 (two) times daily.   WARFARIN (COUMADIN) 3 MG TABLET    Coumadin 3 mg daily on Monday, Wednesday, Thursday, Friday and Coumadin 4.5 mg daily on Tuesday and Saturdays     Physical Exam: Vitals:   01/18/17 1256  BP: 92/68  Pulse: 89  Resp: 20  Temp: 97.4 F (36.3 C)  TempSrc: Oral  SpO2: 98%    Physical Exam  Constitutional: He is oriented to person, place, and time. He appears well-developed and well-nourished. No distress.  Frail, ill appearing  HENT:  Head: Normocephalic and atraumatic.  Mouth/Throat: Oropharynx is clear and moist. No oropharyngeal exudate.  Eyes: Conjunctivae and EOM are normal. Pupils are equal, round, and reactive to light. Right eye exhibits no discharge. Left eye exhibits no discharge.  Neck: Normal range of motion. Neck supple.  Cardiovascular: Normal rate, regular rhythm, normal heart sounds and intact distal pulses.   Pulmonary/Chest: Effort normal. No accessory muscle usage. No respiratory distress.  Diminished throughout   Abdominal: Soft. Bowel sounds are normal. He exhibits no distension.  There is no tenderness. There is no guarding.  Musculoskeletal: He exhibits edema. He exhibits no tenderness.  Stasis hyperpigmentation to bilateral lower extremities; left greater than right 1+ pitting edema  Neurological: He is alert and oriented to person, place, and time.  Skin: Skin is warm and dry. Rash noted. Rash is maculopapular (coverng back and extending around to sides bilaterally). He is not diaphoretic.  Psychiatric: He has a normal mood and affect.    Labs reviewed: Basic Metabolic Panel:  Recent Labs  10/30/16 0238  12/30/16 0350 12/31/16 0322 01/01/17 0526 01/02/17 0346 01/03/17 0404  NA 134*  < > 139 135 139 139 140  K 3.8  < > 4.0 4.0 3.3* 3.3* 3.4*  CL 83*  < > 93* 88* 88* 90* 86*  CO2 33*  < > 36* 36* 41* 41* 44*  GLUCOSE 376*  < > 175* 276* 164* 123* 173*  BUN 105*  < > 48* 60* 65* 51* 57*  CREATININE 2.21*  < > 1.99* 1.66* 1.37* 1.16 1.28*  CALCIUM 8.5*  < > 8.3* 8.5* 8.6* 8.7* 8.9  MG 1.9  < > 2.2 2.6*  --   --  2.3  PHOS 4.6  --   --   --   --   --   --   < > = values in this interval not displayed.  Estimated Creatinine Clearance: 59.7 mL/min (A) (by C-G formula based on SCr of 1.28 mg/dL (H)).  Liver Function Tests:  Recent Labs  12/29/16 0231 12/30/16 0350 12/31/16 0322  AST 23 18 19   ALT 14* 11* 14*  ALKPHOS 76 69 79  BILITOT 1.1 0.6 0.5  PROT 6.5 6.2* 6.4*  ALBUMIN 2.4* 2.1* 2.0*   No results for input(s): LIPASE, AMYLASE in the last 8760 hours.  Recent Labs  10/30/16 0903  AMMONIA 19   CBC:  Recent Labs  12/29/16 0231 12/30/16 0350 12/31/16 0322 01/01/17 0526 01/02/17 0346 01/03/17 0404  WBC 21.9* 20.9* 23.8* 21.7* 20.0* 21.7*  NEUTROABS 10.3* 11.3* 14.0*  --   --   --  HGB 13.0 11.9* 12.2* 12.9* 13.8 14.6  HCT 40.3 36.7* 37.0* 39.3 41.8 43.9  MCV 94.4 92.7 92.0 92.0 91.7 92.0  PLT 143* 138* 172 199 192 173   TSH:  Recent Labs  10/30/16 0903  TSH 0.682   A1C: Lab Results  Component Value Date   HGBA1C 7.7  (H) 10/30/2016   Lipid Panel:  Recent Labs  09/15/16 0434  CHOL 80  HDL 23*  LDLCALC 40  TRIG 86  CHOLHDL 3.5   Lab Results  Component Value Date   INR 2.5 (A) 01/16/2017   INR 1.90 01/03/2017   INR 2.51 01/02/2017   PROTIME 26.6 (A) 01/16/2017   PROTIME 18.6 (A) 12/21/2016   PROTIME 19.8 (A) 12/15/2016     Assessment/Plan 1. Rash and nonspecific skin eruption -improving on prednisone, will cont 10 mg BID for 3 additional days.  -conts on vistaril as needed itch -to notify if symptoms worsen or fail to improve  2. Gout Uloric DC'd due to reaction and placed on allergy list.  Will start allopurinol 100 mg daily and monitor Cr  3. Current use of long term anticoagulation INR scheduled for 01/23/17   Carlos American. Harle Battiest  [facility] [phone] 8 am - 5 pm) [phone] (after hours)

## 2017-01-18 NOTE — Telephone Encounter (Signed)
Rodena Piety, do you have a CMN on this pt?  Please advise, and if not I will call [facility], thanks

## 2017-01-18 NOTE — Telephone Encounter (Signed)
I do not have this form no does Dr Halford Chessman.

## 2017-01-19 NOTE — Telephone Encounter (Signed)
LMTCB for Moshe Cipro to have CMN re-faxed.

## 2017-01-23 LAB — PROTIME-INR: Protime: 38.5 seconds — AB (ref 10.0–13.8)

## 2017-01-23 LAB — POCT INR: INR: 3.9 — AB (ref 0.9–1.1)

## 2017-01-24 NOTE — Telephone Encounter (Signed)
lmtcb X2 for Moshe Cipro to refax CMN

## 2017-01-25 LAB — PROTIME-INR: Protime: 32.3 seconds — AB (ref 10.0–13.8)

## 2017-01-25 LAB — POCT INR: INR: 3.1 — AB (ref 0.9–1.1)

## 2017-01-25 NOTE — Telephone Encounter (Signed)
Called and lmom x 1 for deane  Ext  [phone].

## 2017-01-26 ENCOUNTER — Encounter: Payer: Self-pay | Admitting: *Deleted

## 2017-01-26 NOTE — Telephone Encounter (Signed)
Have left numerous messages for Moshe Cipro to call back from [facility].  Will close this messagae as we have not heard back from her x 4 calls

## 2017-01-27 LAB — PROTIME-INR: Protime: 26.4 seconds — AB (ref 10.0–13.8)

## 2017-01-27 LAB — POCT INR: INR: 2.4 — AB (ref 0.9–1.1)

## 2017-02-01 ENCOUNTER — Other Ambulatory Visit: Payer: Self-pay | Admitting: *Deleted

## 2017-02-01 ENCOUNTER — Ambulatory Visit (HOSPITAL_COMMUNITY)
Admission: RE | Admit: 2017-02-01 | Discharge: 2017-02-01 | Disposition: A | Discharge status: Home / Self Care | Payer: PPO | Source: Ambulatory Visit | Attending: Internal Medicine | Admitting: Internal Medicine

## 2017-02-01 DIAGNOSIS — I739 Peripheral vascular disease, unspecified: Secondary | ICD-10-CM | POA: Insufficient documentation

## 2017-02-01 DIAGNOSIS — R0989 Other specified symptoms and signs involving the circulatory and respiratory systems: Secondary | ICD-10-CM | POA: Insufficient documentation

## 2017-02-01 NOTE — Patient Outreach (Signed)
[facility]) Care Management  02/01/2017  IMMANUEL FEDAK 1932-11-07 [id number]   Telephone Screen  Referral Date: 01/31/17 Referral Source: [employer] Referral Referral Reason: Discharged from [place] on 01/28/17 Insurance: [employer]   Outreach attempt # 1 to patient. No answer. RN CM left HIPAA compliant message along with contact info.   Plan: RN CM will make outreach attempt to patient within two business days.  Lake Bells, RN, BSN, MHA/MSL, [place] Telephonic Care Manager Coordinator [facility] Direct Phone: [phone] Toll Free: [phone] Fax: [phone]

## 2017-02-01 NOTE — Progress Notes (Signed)
VASCULAR LAB PRELIMINARY  ARTERIAL  ABI completed:    RIGHT    LEFT    PRESSURE WAVEFORM  PRESSURE WAVEFORM  BRACHIAL 109 Triphasic BRACHIAL 105 Triphasic  AT 156 Triphasic AT 171 Triphasic  PT 185 Triphasic PT 157 Triphasic    RIGHT LEFT  ABI 1.7 1.44   ABIs and Doppler waveforms bilaterally indicate normal arterial flow at rest  Raesha Coonrod, RVS 02/01/2017, 3:09 PM

## 2017-02-03 ENCOUNTER — Other Ambulatory Visit: Payer: Self-pay | Admitting: *Deleted

## 2017-02-03 ENCOUNTER — Non-Acute Institutional Stay (SKILLED_NURSING_FACILITY): Payer: PPO | Admitting: Nurse Practitioner

## 2017-02-03 DIAGNOSIS — Z7901 Long term (current) use of anticoagulants: Secondary | ICD-10-CM | POA: Diagnosis not present

## 2017-02-03 DIAGNOSIS — I4891 Unspecified atrial fibrillation: Secondary | ICD-10-CM | POA: Diagnosis not present

## 2017-02-03 DIAGNOSIS — I482 Chronic atrial fibrillation, unspecified: Secondary | ICD-10-CM

## 2017-02-03 DIAGNOSIS — J438 Other emphysema: Secondary | ICD-10-CM | POA: Diagnosis not present

## 2017-02-03 DIAGNOSIS — N183 Chronic kidney disease, stage 3 unspecified: Secondary | ICD-10-CM

## 2017-02-03 DIAGNOSIS — E1142 Type 2 diabetes mellitus with diabetic polyneuropathy: Secondary | ICD-10-CM | POA: Diagnosis not present

## 2017-02-03 DIAGNOSIS — I5032 Chronic diastolic (congestive) heart failure: Secondary | ICD-10-CM

## 2017-02-03 DIAGNOSIS — J9611 Chronic respiratory failure with hypoxia: Secondary | ICD-10-CM

## 2017-02-03 DIAGNOSIS — L299 Pruritus, unspecified: Secondary | ICD-10-CM | POA: Diagnosis not present

## 2017-02-03 LAB — PROTIME-INR: Protime: 22.2 seconds — AB (ref 10.0–13.8)

## 2017-02-03 LAB — POCT INR: INR: 2 — AB (ref 0.9–1.1)

## 2017-02-03 NOTE — Progress Notes (Signed)
Patient ID: William Braun, male   DOB: 29-Jul-1933, 83 y.o.   MRN: [id number]      Nursing Home Location:  [facility] Room: 313  Place of Service: SNF (31)  PCP: Hendricks Limes, MD  Allergies  Allergen Reactions  . Quinine Palpitations and Other (See Comments)    Seizure   . Uloric [Febuxostat] Rash    Chief Complaint  Patient presents with  . Medical Management of Chronic Issues    Resident is being seen for a routine visit.     HPI:  Patient is a 83 y.o. male seen today at [facility] for routine follow up. Pt with hx of chronic diastolic CHF, atrial fibrillation, diabetes mellitus, chronic kidney disease, sleep apnea, hypertension. Pt was discharged from hospital on 01/03/17 after sepsis from cellulitis; also noted to have right wrist gout and was placed on uloric. After Uloric started he noticed a very itch rash. Uloric was stopped and rash treated with prednisone. Rash has resolved but itching persist.  Pt denies shortness of breath at this time or chest pain. No increase in edema. Follow with CHF clinic due to heart failure. Staff reports increase shortness of breath with activity.  Blood sugars ranging from 84-189 fasting. Nursing without any acute concerns.   Review of Systems:  Review of Systems  Constitutional: Negative for activity change, appetite change, chills, diaphoresis, fatigue, fever and unexpected weight change.  HENT: Negative for congestion, postnasal drip, sinus pain, sinus pressure and sore throat.   Eyes: Negative.   Respiratory: Negative for cough, chest tightness, shortness of breath and wheezing.        Shortness of breath, cough and congestion at baseline.   Cardiovascular: Positive for leg swelling. Negative for chest pain and palpitations.       Left greater than right lower extremity edema  Gastrointestinal: Negative for abdominal pain, constipation, diarrhea, nausea and vomiting.  Musculoskeletal: Negative for arthralgias,  joint swelling and myalgias.  Skin: Positive for rash (improved).       Itching  Neurological: Negative for syncope, light-headedness and headaches.  Psychiatric/Behavioral: Negative for agitation and behavioral problems. Confusion: at times. The patient is not nervous/anxious.     Past Medical History:  Diagnosis Date  . Atrial fibrillation ([place])   . Cerebrovascular disease   . CHF (congestive heart failure) ([place])   . CLL (chronic lymphocytic leukemia) ([place]) 11/17/2014  . COPD (chronic obstructive pulmonary disease) ([place])   . History of thrombocytopenia   . Obesity   . Osteoarthritis   . Other and unspecified hyperlipidemia   . Type II or unspecified type diabetes mellitus without mention of complication, not stated as uncontrolled   . Unspecified essential hypertension    Past Surgical History:  Procedure Laterality Date  . KNEE ARTHROSCOPY W/ ALLOGRAFT IMPANT    . VASECTOMY     Social History:   reports that he quit smoking about 38 years ago. His smoking use included Cigarettes. He started smoking about 70 years ago. He has a 68.00 pack-year smoking history. He has never used smokeless tobacco. He reports that he does not drink alcohol. His drug history is not on file.  Family History  Problem Relation Age of Onset  . Heart disease Father     Medications: Patient's Medications  New Prescriptions   No medications on file  Previous Medications   ALBUTEROL (VENTOLIN HFA) 108 (90 BASE) MCG/ACT INHALER    Inhale 2 puffs into the lungs every 6 (six) hours  as needed for wheezing or shortness of breath.   ALLOPURINOL (ZYLOPRIM) 100 MG TABLET    Take 1 tablet (100 mg total) by mouth daily.   ALUMINUM-MAGNESIUM HYDROXIDE-SIMETHICONE (MAALOX) [phone] MG/5ML SUSP    Take 30 mLs by mouth every 4 (four) hours as needed (INDIGESTION OR HEARTBURN).   ANTISEPTIC ORAL RINSE (BIOTENE) LIQD    15 mLs by Mouth Rinse route every 6 (six) hours as needed for dry mouth.   ATORVASTATIN  (LIPITOR) 10 MG TABLET    Take 1 tablet (10 mg total) by mouth daily at 6 PM.   BISACODYL (DULCOLAX) 10 MG SUPPOSITORY    Place 10 mg rectally daily as needed for moderate constipation.   BUDESONIDE-FORMOTEROL (SYMBICORT) 160-4.5 MCG/ACT INHALER    Inhale 2 puffs into the lungs 2 (two) times daily. 2 puffs twice daily   DIVALPROEX (DEPAKOTE) 250 MG DR TABLET    Take 250 mg by mouth 2 (two) times daily.   GLIMEPIRIDE (AMARYL) 4 MG TABLET    Take 8 mg by mouth daily with breakfast.   HYDROCODONE-ACETAMINOPHEN (NORCO/VICODIN) 5-325 MG TABLET    Take 1 tablet by mouth every 8 (eight) hours as needed for moderate pain.   HYDROXYZINE (ATARAX/VISTARIL) 10 MG TABLET    Take 10 mg by mouth every 8 (eight) hours as needed for itching.   INSULIN GLARGINE (LANTUS) 100 UNIT/ML SOLOSTAR PEN    Inject 18 Units into the skin daily at 10 pm.   IPRATROPIUM-ALBUTEROL (DUONEB) 0.5-2.5 (3) MG/3ML SOLN    Take 3 mLs by nebulization every 6 (six) hours as needed (COPD).   MAGNESIUM HYDROXIDE (MILK OF MAGNESIA) 400 MG/5ML SUSPENSION    Take 30 mLs by mouth daily as needed for mild constipation.   MELATONIN 3 MG TABS    Take 6 mg by mouth at bedtime as needed (sleep).    METOLAZONE (ZAROXOLYN) 2.5 MG TABLET    Take 1 tablet (2.5 mg total) by mouth once a week.   METOPROLOL SUCCINATE (TOPROL-XL) 25 MG 24 HR TABLET    Take 2 tablets (50 mg total) by mouth daily.   MUPIROCIN OINTMENT (BACTROBAN) 2 %    Apply 1 application topically 2 (two) times daily.   NITROGLYCERIN (NITROSTAT) 0.4 MG SL TABLET    Place 1 tablet (0.4 mg total) under the tongue every 5 (five) minutes as needed for chest pain.   POLYETHYLENE GLYCOL (MIRALAX / GLYCOLAX) PACKET    Take 17 g by mouth daily.   POTASSIUM CHLORIDE SA (K-DUR,KLOR-CON) 20 MEQ TABLET    Take 2 tablets (40 mEq total) by mouth 2 (two) times daily.   SENNA-DOCUSATE (SENOKOT-S) 8.6-50 MG TABLET    Take 1 tablet by mouth at bedtime as needed for mild constipation.   SODIUM PHOSPHATES (RA  SALINE ENEMA RE)    Place 1 Applicatorful rectally daily as needed (constipation).   TIOTROPIUM (SPIRIVA) 18 MCG INHALATION CAPSULE    Place 1 capsule (18 mcg total) into inhaler and inhale daily.   TORSEMIDE (DEMADEX) 20 MG TABLET    Take 3 tablets (60 mg total) by mouth 2 (two) times daily.   WARFARIN (COUMADIN) 3 MG TABLET    Take 3 mg by mouth daily.   Modified Medications   No medications on file  Discontinued Medications   FEEDING SUPPLEMENT, GLUCERNA SHAKE, (GLUCERNA SHAKE) LIQD    Take 237 mLs by mouth 3 (three) times daily between meals.   LINIMENTS (SALONPAS EX)    Apply 1 patch topically every  12 (twelve) hours. With 4% lidocaine     Physical Exam: Vitals:   02/03/17 1346  BP: 140/74  Pulse: 75  Resp: 18  Temp: 98.1 F (36.7 C)  SpO2: 97%  Weight: 244 lb (110.7 kg)  Height: 6\' 2"  (1.88 m)    Physical Exam  Constitutional: He is oriented to person, place, and time. He appears well-developed and well-nourished. No distress.  Frail, ill appearing  HENT:  Head: Normocephalic and atraumatic.  Mouth/Throat: Oropharynx is clear and moist. No oropharyngeal exudate.  Eyes: Conjunctivae and EOM are normal. Pupils are equal, round, and reactive to light. Right eye exhibits no discharge. Left eye exhibits no discharge.  Neck: Normal range of motion. Neck supple.  Cardiovascular: Normal rate, regular rhythm, normal heart sounds and intact distal pulses.   Pulmonary/Chest: Effort normal. No accessory muscle usage. No respiratory distress.  Diminished throughout   Abdominal: Soft. Bowel sounds are normal. He exhibits no distension. There is no tenderness. There is no guarding.  Musculoskeletal: He exhibits edema. He exhibits no tenderness.  Stasis hyperpigmentation to bilateral lower extremities; left greater than right 1+ pitting edema  Neurological: He is alert and oriented to person, place, and time.  Skin: Skin is warm and dry. No rash noted. He is not diaphoretic.    Psychiatric: He has a normal mood and affect.    Labs reviewed: Basic Metabolic Panel:  Recent Labs  10/30/16 0238  12/30/16 0350 12/31/16 0322 01/01/17 0526 01/02/17 0346 01/03/17 0404  NA 134*  < > 139 135 139 139 140  K 3.8  < > 4.0 4.0 3.3* 3.3* 3.4*  CL 83*  < > 93* 88* 88* 90* 86*  CO2 33*  < > 36* 36* 41* 41* 44*  GLUCOSE 376*  < > 175* 276* 164* 123* 173*  BUN 105*  < > 48* 60* 65* 51* 57*  CREATININE 2.21*  < > 1.99* 1.66* 1.37* 1.16 1.28*  CALCIUM 8.5*  < > 8.3* 8.5* 8.6* 8.7* 8.9  MG 1.9  < > 2.2 2.6*  --   --  2.3  PHOS 4.6  --   --   --   --   --   --   < > = values in this interval not displayed.  CrCl cannot be calculated (Patient's most recent lab result is older than the maximum 21 days allowed.).  Liver Function Tests:  Recent Labs  12/29/16 0231 12/30/16 0350 12/31/16 0322  AST 23 18 19   ALT 14* 11* 14*  ALKPHOS 76 69 79  BILITOT 1.1 0.6 0.5  PROT 6.5 6.2* 6.4*  ALBUMIN 2.4* 2.1* 2.0*   No results for input(s): LIPASE, AMYLASE in the last 8760 hours.  Recent Labs  10/30/16 0903  AMMONIA 19   CBC:  Recent Labs  12/29/16 0231 12/30/16 0350 12/31/16 0322 01/01/17 0526 01/02/17 0346 01/03/17 0404  WBC 21.9* 20.9* 23.8* 21.7* 20.0* 21.7*  NEUTROABS 10.3* 11.3* 14.0*  --   --   --   HGB 13.0 11.9* 12.2* 12.9* 13.8 14.6  HCT 40.3 36.7* 37.0* 39.3 41.8 43.9  MCV 94.4 92.7 92.0 92.0 91.7 92.0  PLT 143* 138* 172 199 192 173   TSH:  Recent Labs  10/30/16 0903  TSH 0.682   A1C: Lab Results  Component Value Date   HGBA1C 7.7 (H) 10/30/2016   Lipid Panel:  Recent Labs  09/15/16 0434  CHOL 80  HDL 23*  LDLCALC 40  TRIG 86  CHOLHDL 3.5  Lab Results  Component Value Date   INR 2.4 (A) 01/27/2017   INR 3.1 (A) 01/25/2017   INR 3.9 (A) 01/23/2017   PROTIME 26.4 (A) 01/27/2017   PROTIME 32.3 (A) 01/25/2017   PROTIME 38.5 (A) 01/23/2017     Assessment/Plan 1. Chronic atrial fibrillation (HCC) Rate controlled,  remains on coumadin for anticoagulation, last INR 1.8 and coumadin was adjusted, pending INR today.   2. Chronic kidney disease (CKD), stage III (moderate) Will follow up BMP  3. Chronic diastolic CHF (congestive heart failure) ([place]) euvolemic at this time. conts on Demadex and zaroxolyn weekly   4. Pruritus of skin Will schedule Vistaril 10 mg every 8 hours for 7 days then to resume PRN  5. Chronic respiratory failure with hypoxia (HCC) Stable, exacerbation in COPD or CHF in the last month.  Requires cont O2  6. Other emphysema ([place]) Remains stable however will get out of breath with activity at times, appears anxiety does play a role in this. Will cont spiriva and as needed neb treatments  7. Diabetic polyneuropathy associated with type 2 diabetes mellitus ([place]) Stable, Will get A1c, podiatry referral for diabetic foot exam, ophthalmology referral for diabetic eye exam.  Will get urine for micro albumin.  Will get A1c  To cont current regimen    Perris Conwell K. Harle Battiest  [facility] [phone] 8 am - 5 pm) [phone] (after hours)

## 2017-02-03 NOTE — Patient Outreach (Signed)
[facility]) Care Management  02/03/2017  [name] 27-Jun-1933 [id number]   Transition of Care Referral  Referral Date: 02/01/17 Referral Source: HTA Referral Date of Discharge: 01/28/17 Facility: [facility] Discharge Diagnosis: Sepsis, Cellulitis Insurance: HTA  Outreach attempt # 2 to patient. No answer. RN CM left HIPAA compliant message along with contact info.     Plan: RN CM will make outreach attempt to patient within the next business day.   Lake Bells, RN, BSN, MHA/MSL, [place] Telephonic Care Manager Coordinator [facility] Direct Phone: [phone] Toll Free: [phone] Fax: [phone]

## 2017-02-06 ENCOUNTER — Other Ambulatory Visit: Payer: Self-pay | Admitting: *Deleted

## 2017-02-06 ENCOUNTER — Ambulatory Visit: Payer: Self-pay | Admitting: *Deleted

## 2017-02-06 DIAGNOSIS — A419 Sepsis, unspecified organism: Secondary | ICD-10-CM | POA: Diagnosis not present

## 2017-02-06 DIAGNOSIS — E785 Hyperlipidemia, unspecified: Secondary | ICD-10-CM | POA: Diagnosis not present

## 2017-02-06 DIAGNOSIS — E119 Type 2 diabetes mellitus without complications: Secondary | ICD-10-CM | POA: Diagnosis not present

## 2017-02-06 DIAGNOSIS — I130 Hypertensive heart and chronic kidney disease with heart failure and stage 1 through stage 4 chronic kidney disease, or unspecified chronic kidney disease: Secondary | ICD-10-CM | POA: Diagnosis not present

## 2017-02-06 DIAGNOSIS — D649 Anemia, unspecified: Secondary | ICD-10-CM | POA: Diagnosis not present

## 2017-02-06 LAB — BASIC METABOLIC PANEL
BUN: 35 mg/dL — AB (ref 4–21)
Creatinine: 1.7 mg/dL — AB (ref 0.6–1.3)
Glucose: 45 mg/dL
Potassium: 3.7 mmol/L (ref 3.4–5.3)
SODIUM: 142 mmol/L (ref 137–147)

## 2017-02-06 LAB — MICROALBUMIN, URINE: Microalb, Ur: 2.6

## 2017-02-06 LAB — HEMOGLOBIN A1C: Hemoglobin A1C: 6.8

## 2017-02-06 NOTE — Patient Outreach (Addendum)
[facility]) Care Management  02/06/2017  William Braun 1933-04-01 [id number]  Transition of Care Referral  Referral Date: 02/01/17 Referral Source: HTA Referral Date of Discharge: 01/28/17 Facility: [facility] Discharge Diagnosis: Sepsis, Cellulitis Insurance: HTA  Outreach attempt # 3 to patient. Spoke with wife. She stated, patient is still a resident at [facility].   Plan: RN CM will inform [facility] of patient's status.. RN CM will make referral to [facility] for follow-up with discharge disposition.   Lake Bells, RN, BSN, MHA/MSL, [place] Telephonic Care Manager Coordinator [facility] Direct Phone: [phone] Toll Free: [phone] Fax: [phone]

## 2017-02-06 NOTE — Addendum Note (Signed)
Addended by: Gabriel Cirri on: 02/06/2017 03:04 PM   Modules accepted: Orders

## 2017-02-07 ENCOUNTER — Encounter: Payer: Self-pay | Admitting: Internal Medicine

## 2017-02-07 ENCOUNTER — Non-Acute Institutional Stay (SKILLED_NURSING_FACILITY): Payer: PPO | Admitting: Internal Medicine

## 2017-02-07 DIAGNOSIS — I10 Essential (primary) hypertension: Secondary | ICD-10-CM | POA: Diagnosis not present

## 2017-02-07 DIAGNOSIS — J9611 Chronic respiratory failure with hypoxia: Secondary | ICD-10-CM | POA: Diagnosis not present

## 2017-02-07 DIAGNOSIS — M109 Gout, unspecified: Secondary | ICD-10-CM

## 2017-02-07 DIAGNOSIS — E1142 Type 2 diabetes mellitus with diabetic polyneuropathy: Secondary | ICD-10-CM

## 2017-02-07 NOTE — Assessment & Plan Note (Signed)
02/07/17 blood pressure 83/63 Decrease metoprolol to 25 mg daily

## 2017-02-07 NOTE — Assessment & Plan Note (Signed)
Continue CPAP, supplemental oxygen, and pulmonary toilet

## 2017-02-07 NOTE — Progress Notes (Signed)
This is a nursing facility follow up for specific acute issue of hypoglycemia , swelling of hand & abnormal labs.  Interim medical record and care since last [place] visit was updated with review of diagnostic studies and change in clinical status since last visit were documented.  HPI: William Braun I was called about swelling of the right hand and glucose of 45 by his nurse. Glimiperide was discontinued and the basal insulin decreased by 2 units. There may have been slight change in temperature of the hand according to the nurse but the patient was afebrile & there was no significant redness. On 12/29/16 uric acid was 12.1. Most recent labs revealed creatinine 1.65, GFR 38.16, an A1c of 6.8%. This A1c indicates excellent diabetic control, major risk here is hypoglycemia as noted.  Review of systems: Appears reliable although he initially had difficulty providing correct date. Initially he said " August 18 18 or 20". Thereafter he did give the date as May 18 , 2018. He identified the president. His major issue is chronic dyspnea, he states that the CPAP has been of significant benefit He denies any musculoskeletal symptoms at this time, but validates that the hand has been swollen "a couple weeks".  He denies any injury to the hand.. He states that cool compresses have helped the swelling and pain in the right hand. He denies any history of gout. He describes polydipsia. He did see the urologist last week and "an x-ray was taken". He denies any active genitourinary symptoms at this time.  Constitutional: No fever,significant weight change, fatigue  Eyes: No redness, discharge, pain, vision change ENT/mouth: No nasal congestion,  purulent discharge, earache,change in hearing ,sore throat  Cardiovascular: No chest pain, palpitations,paroxysmal nocturnal dyspnea, claudication, edema  Respiratory: No hemoptysis Gastrointestinal: No heartburn,dysphagia,abdominal pain, nausea /  vomiting,rectal bleeding, melena,change in bowels Genitourinary: No dysuria,hematuria, pyuria,  incontinence, nocturia Musculoskeletal: No joint stiffness,new weakness,pain Dermatologic: No rash, pruritus Neurologic: No dizziness,headache,syncope, seizures, numbness , tingling Psychiatric: No significant anxiety , depression, insomnia, anorexia Endocrine: No change in hair/skin/ nails, excessive hunger, excessive urination  Hematologic/lymphatic: No significant  lymphadenopathy,abnormal bleeding Allergy/immunology: No itchy/ watery eyes, significant sneezing, urticaria, angioedema  Physical exam:  Pertinent or positive findings: Pattern alopecia is present. Initially he was very lethargic but did become more alert and interactive. Pupils are small. He has a small eschar over the bridge of his nose. Dentition is fair with staining and fractured teeth. Heart sound are markedly distant, rhythm appears to be clinically irregular. Breath sounds are also markedly decreased. Abdomen is protuberant. The right hand is visibly edematous with slight erythema but no definite cellulitis. There is slight increased temperature to touch. He has hyperpigmentation over the shins particularly the right. Scattered irregular bruising over forearms.He has trace edema. Pedal pulses are decreased.   DIP osteoarthritic and flexion changes are present. At bedside are lifesavers and other hard candies  General appearance:Adequately nourished; no acute distress , increased work of breathing is present.   Lymphatic: No lymphadenopathy about the head, neck, axilla . Eyes: No conjunctival inflammation or lid edema is present. There is no scleral icterus. Ears:  External ear exam shows no significant lesions or deformities.   Nose:  External nasal examination shows no deformity or inflammation. Nasal mucosa are pink and moist without lesions ,exudates Oral exam: lips and gums are healthy appearing.There is no oropharyngeal  erythema or exudate . Neck:  No thyromegaly, masses, tenderness noted.    Heart:  No gallop, murmur,  click, rub .  Abdomen:Bowel sounds are normal. Abdomen is soft and nontender with no organomegaly, hernias,masses. GU: deferred  Extremities:  No cyanosis, clubbing Skin: Warm & dry w/o tenting. No significant rash.  See summary under each active problem in the Problem List with associated updated therapeutic plan

## 2017-02-07 NOTE — Assessment & Plan Note (Addendum)
The most common cause of elevated uric acid is the ingestion of sugar from high fructose corn syrup sources. You should consume less than 40 grams (preferably none)  of sugar per day from foods and drinks with high fructose corn syrup as number 1, 2, 3, or #4 on the label.   Low-dose allopurinol Film of hand

## 2017-02-07 NOTE — Assessment & Plan Note (Signed)
02/06/17 glucose 45, basal insulin decreased by 2 units and glimepiride discontinued A1c 6.8% indicates excellent control, his A1c goal would be 8.5% or less. Critical is to avoid hypoglycemia

## 2017-02-07 NOTE — Patient Instructions (Signed)
See assessment and plan under each diagnosis in the problem list and acutely for this visit 

## 2017-02-09 DIAGNOSIS — I5032 Chronic diastolic (congestive) heart failure: Secondary | ICD-10-CM | POA: Diagnosis not present

## 2017-02-09 DIAGNOSIS — A419 Sepsis, unspecified organism: Secondary | ICD-10-CM | POA: Diagnosis not present

## 2017-02-09 DIAGNOSIS — E7800 Pure hypercholesterolemia, unspecified: Secondary | ICD-10-CM | POA: Diagnosis not present

## 2017-02-09 DIAGNOSIS — M2550 Pain in unspecified joint: Secondary | ICD-10-CM | POA: Diagnosis not present

## 2017-02-09 DIAGNOSIS — M109 Gout, unspecified: Secondary | ICD-10-CM | POA: Diagnosis not present

## 2017-02-09 DIAGNOSIS — D649 Anemia, unspecified: Secondary | ICD-10-CM | POA: Diagnosis not present

## 2017-02-10 LAB — PROTIME-INR: Protime: 20.9 seconds — AB (ref 10.0–13.8)

## 2017-02-10 LAB — POCT INR: INR: 1.8 — AB (ref 0.9–1.1)

## 2017-02-14 ENCOUNTER — Non-Acute Institutional Stay (SKILLED_NURSING_FACILITY): Payer: PPO | Admitting: Internal Medicine

## 2017-02-14 ENCOUNTER — Encounter: Payer: Self-pay | Admitting: Internal Medicine

## 2017-02-14 DIAGNOSIS — M109 Gout, unspecified: Secondary | ICD-10-CM

## 2017-02-14 NOTE — Patient Instructions (Signed)
See assessment and plan under gout diagnosis in the problem list

## 2017-02-14 NOTE — Progress Notes (Signed)
NURSING HOME LOCATION:  [facility] ROOM NUMBER:  [id number] CODE STATUS:  Full Code  PCP:  Hendricks Limes, MD [place] [id number]  This is a nursing facility follow up for specific acute issue of right wrist pain and swelling.  Interim medical record and care since last [place] visit was updated with review of diagnostic studies and change in clinical status since last visit were documented.  HPI: The patient is scheduled for MRI  To R/O osteomyelitis because of persistent swelling, redness, and increased temperature at the right wrist. This is in the context of a uric acid of 12.1. Additionally sedimentation rate and C-reactive protein were elevated as was white count. He has been on intermittent steroids. He had been ingesting snacks and candies with-high fructose corn syrup sugar as a probable role in his hyperuricemia.  Review of systems: He states that his breathing is significantly improved. He has a follow-up pulmonary visit this week.  He has an appointment to see urology in July. His wife was concerned as he was treated for urinary tract infection earlier this month. He does not have a history of recurrent urinary tract infections. Of significance he was mechanically ventilated in 2010-11 and had acute kidney insufficiency. He had a Foley catheter for 2 months. There was marked difficulty in weaning the Foley catheter.   At this time he denies any active GU symptoms. The last urine culture in Epic was 10/29/16 and revealed insignificant growth in less than 10,000 colonies.  He does have polydipsia.  Constitutional: No fever,significant weight change, fatigue  Eyes: No redness, discharge, pain, vision change ENT/mouth: No nasal congestion,  purulent discharge, earache,change in hearing ,sore throat  Cardiovascular: No chest pain, palpitations,paroxysmal nocturnal dyspnea, claudication,  Respiratory: No cough, sputum production,hemoptysis,    Gastrointestinal: No heartburn,dysphagia,abdominal pain, nausea / vomiting,rectal bleeding, melena,change in bowels Genitourinary: No dysuria,hematuria, pyuria,  incontinence, nocturia Musculoskeletal: No joint stiffness, joint swelling, weakness,pain now Dermatologic: No rash, pruritus, change in appearance of skin Endocrine: No change in hair/skin/ nails, excessive hunger, excessive urination    Physical exam:  Pertinent or positive findings: he appears somewhat chronically ill and has chronic bronchitic facies. He has poor dentition with nicotine staining. There is slight decrease in the left nasolabial fold definition. There is respiratory variation to his heart rhythm. He has minor rales. Abdomen is protuberant. Pedal pulses are decreased. He has isolated DIP osteoarthritic changes. There is marked hyperpigmentation of the shins. He has abrasions and bruising over the forearms.Trace edema. There is minimal soft tissue swelling over the dorsum of the right wrist. There is no change in color or temperature. He has limited but painless range of motion w/o pain of R wrist.   General appearance:Adequately nourished; no acute distress , increased work of breathing is present.   Lymphatic: No lymphadenopathy about the head, neck, axilla . Eyes: No conjunctival inflammation or lid edema is present. There is no scleral icterus. Ears:  External ear exam shows no significant lesions or deformities.   Nose:  External nasal examination shows no deformity or inflammation. Nasal mucosa are pink and moist without lesions ,exudates Oral exam: lips and gums are healthy appearing.There is no oropharyngeal erythema or exudate . Neck:  No thyromegaly, masses, tenderness noted.    Heart:  No gallop, murmur, click, rub .  Abdomen:Bowel sounds are normal. Abdomen is soft and nontender with no organomegaly, hernias,masses. GU: deferred  Extremities:  No cyanosis, clubbing  Neurologic exam :  Cn 2-7  intact Strength equal  in upper & lower extremities Balance,Rhomberg,finger to nose testing could not be completed due to clinical state Deep tendon reflexes are equal Skin: Warm & dry w/o tenting. No significant lesions or rash.  See summary under each active problem in the Problem List with associated updated therapeutic plan

## 2017-02-14 NOTE — Assessment & Plan Note (Addendum)
Despite the elevation in white count, sedimentation rate, and C-reactive protein; the swelling, erythema, and elevated temperature have all resolved. There is minimal soft tissue swelling. MRI will be canceled. He has had a total knee replacement and therefore would not be a candidate for MRI in any event. Relationship of hyperuricemia to ingestion of high fructose corn syrup sugar discussed with him and his wife

## 2017-02-16 ENCOUNTER — Encounter: Payer: Self-pay | Admitting: Pulmonary Disease

## 2017-02-16 ENCOUNTER — Ambulatory Visit (INDEPENDENT_AMBULATORY_CARE_PROVIDER_SITE_OTHER): Payer: PPO | Admitting: Pulmonary Disease

## 2017-02-16 VITALS — BP 108/70 | HR 78 | Ht 74.0 in | Wt 246.0 lb

## 2017-02-16 DIAGNOSIS — J9611 Chronic respiratory failure with hypoxia: Secondary | ICD-10-CM | POA: Diagnosis not present

## 2017-02-16 DIAGNOSIS — E662 Morbid (severe) obesity with alveolar hypoventilation: Secondary | ICD-10-CM | POA: Diagnosis not present

## 2017-02-16 DIAGNOSIS — G4733 Obstructive sleep apnea (adult) (pediatric): Secondary | ICD-10-CM | POA: Diagnosis not present

## 2017-02-16 DIAGNOSIS — J432 Centrilobular emphysema: Secondary | ICD-10-CM

## 2017-02-16 DIAGNOSIS — J986 Disorders of diaphragm: Secondary | ICD-10-CM

## 2017-02-16 DIAGNOSIS — R0602 Shortness of breath: Secondary | ICD-10-CM | POA: Diagnosis not present

## 2017-02-16 DIAGNOSIS — J9610 Chronic respiratory failure, unspecified whether with hypoxia or hypercapnia: Secondary | ICD-10-CM | POA: Diagnosis not present

## 2017-02-16 NOTE — Progress Notes (Signed)
Current Outpatient Prescriptions on File Prior to Visit  Medication Sig  . albuterol (VENTOLIN HFA) 108 (90 BASE) MCG/ACT inhaler Inhale 2 puffs into the lungs every 6 (six) hours as needed for wheezing or shortness of breath.  . allopurinol (ZYLOPRIM) 100 MG tablet Take 1 tablet (100 mg total) by mouth daily.  Marland Kitchen aluminum-magnesium hydroxide-simethicone (MAALOX) [phone] MG/5ML SUSP Take 30 mLs by mouth every 4 (four) hours as needed (INDIGESTION OR HEARTBURN).  Marland Kitchen antiseptic oral rinse (BIOTENE) LIQD 15 mLs by Mouth Rinse route every 6 (six) hours as needed for dry mouth.  Marland Kitchen atorvastatin (LIPITOR) 10 MG tablet Take 1 tablet (10 mg total) by mouth daily at 6 PM.  . bisacodyl (DULCOLAX) 10 MG suppository Place 10 mg rectally daily as needed for moderate constipation.  . budesonide-formoterol (SYMBICORT) 160-4.5 MCG/ACT inhaler Inhale 2 puffs into the lungs 2 (two) times daily. 2 puffs twice daily  . divalproex (DEPAKOTE) 250 MG DR tablet Take 250 mg by mouth 2 (two) times daily.  Marland Kitchen HYDROcodone-acetaminophen (NORCO/VICODIN) 5-325 MG tablet Take 1 tablet by mouth every 8 (eight) hours as needed for moderate pain.  . hydrOXYzine (ATARAX/VISTARIL) 10 MG tablet Take 10 mg by mouth every 8 (eight) hours as needed for itching.  . Insulin Glargine (LANTUS SOLOSTAR) 100 UNIT/ML Solostar Pen Inject 16 Units into the skin daily at 10 pm.  . ipratropium-albuterol (DUONEB) 0.5-2.5 (3) MG/3ML SOLN Take 3 mLs by nebulization every 6 (six) hours as needed (COPD).  . magnesium hydroxide (MILK OF MAGNESIA) 400 MG/5ML suspension Take 30 mLs by mouth daily as needed for mild constipation.  . Melatonin 3 MG TABS Take 6 mg by mouth at bedtime as needed (sleep).   . metolazone (ZAROXOLYN) 2.5 MG tablet Take 1 tablet (2.5 mg total) by mouth once a week.  . metoprolol succinate (TOPROL-XL) 25 MG 24 hr tablet Take 25 mg by mouth daily.  . mupirocin ointment (BACTROBAN) 2 % Apply 1 application topically 2 (two) times daily.   . nitroGLYCERIN (NITROSTAT) 0.4 MG SL tablet Place 1 tablet (0.4 mg total) under the tongue every 5 (five) minutes as needed for chest pain.  Marland Kitchen nystatin cream (MYCOSTATIN) Apply 1 application topically 2 (two) times daily. Apply to gluteal fold/buttocks  . polyethylene glycol (MIRALAX / GLYCOLAX) packet Take 17 g by mouth daily.  . potassium chloride SA (K-DUR,KLOR-CON) 20 MEQ tablet Take 2 tablets (40 mEq total) by mouth 2 (two) times daily.  Marland Kitchen senna-docusate (SENOKOT-S) 8.6-50 MG tablet Take 1 tablet by mouth at bedtime as needed for mild constipation.  . Sodium Phosphates (RA SALINE ENEMA RE) Place 1 Applicatorful rectally daily as needed (constipation).  Marland Kitchen tiotropium (SPIRIVA) 18 MCG inhalation capsule Place 1 capsule (18 mcg total) into inhaler and inhale daily.  Marland Kitchen torsemide (DEMADEX) 20 MG tablet Take 3 tablets (60 mg total) by mouth 2 (two) times daily.  Marland Kitchen warfarin (COUMADIN) 2.5 MG tablet Take 2.5 mg by mouth daily.   No current facility-administered medications on file prior to visit.     Chief Complaint  Patient presents with  . Follow-up    Sob staying the same,BIPAP working good.Occass. cough gray.No wheezing,denies cp or tightness Difficulty urinating at times..    Pulmonary tests PFT 2011 >> FEV1 2.04 (66%), FEV1% 65 SNIFF 11/16/12 >> Rt hemidiaphragm paralysis PFT 09/30/13 >> FEV1 1.78 (51%), FEV1% 73, TLC 4.67 (59%), DLCO 49%, no BD CT chest 09/19/16 >> b/l ASD, mediastinal LAN  Cardiac tests Echo 11/21/12 >>  Mod LVH,  EF 60 to 65%, mod LA dilation, mod RV dilation, PAS 55 mmHg Echo 09/19/16 >> mod LVH, EF 55 to 60%, mild AS, mod LA dilation, mild/mod TR, PAS 56 mmHg  Sleep tests ONO on BiPAP and 2 liters 11/19/12 >> test time 7 hrs 41 min.  Mean SpO2 93%, low SpO2 64%.  Spent 4 min 12 sec < 88% BiPAP 10/10/16 to 11/08/16 >> used on 21 of 30 nights with average 5 hrs 55 min.  Average AHI 5.5 with Bipap 14/10 cm H2O  Past medical history HTN, HLD, DM, A fib, CVA 2008,  CLL  Past surgical history, Family history, Allergies reviewd  Vital signs BP 108/70 (BP Location: Left Arm, Cuff Size: Normal)   Pulse 78   Ht 6\' 2"  (1.88 m)   Wt 246 lb (111.6 kg)   SpO2 94%   BMI 31.58 kg/m    History of Present Illness: William Braun is a 83 y.o. male former smoker with COPD, OSA/OHS, Rt hemidiaphragm paralysis, and chronic respiratory failure.  Since he his last visit with me he was in hospital for UTI and then cellulitis.  He is staying at [facility] for rehab.  Still having trouble walking.  Not having much cough or sputum.  Leg swelling better.  Denies chest pain.  Physical Exam:  General - pleasant, in wheelchair, wearing oxygen Eyes - pupils reactive ENT - no sinus tenderness, no oral exudate, no LAN, poor dentition Cardiac - regular, no murmur Chest - decreased BS, no wheeze, rales Abd - soft, non tender Ext - no edema Skin - venous stasis changes Neuro - normal strength Psych - normal mood   CMP Latest Ref Rng & Units 02/06/2017 01/03/2017 01/02/2017  Glucose 65 - 99 mg/dL - 173(H) 123(H)  BUN 4 - 21 mg/dL 35(A) 57(H) 51(H)  Creatinine 0.6 - 1.3 mg/dL 1.7(A) 1.28(H) 1.16  Sodium 137 - 147 mmol/L 142 140 139  Potassium 3.4 - 5.3 mmol/L 3.7 3.4(L) 3.3(L)  Chloride 101 - 111 mmol/L - 86(L) 90(L)  CO2 22 - 32 mmol/L - 44(H) 41(H)  Calcium 8.9 - 10.3 mg/dL - 8.9 8.7(L)  Total Protein 6.5 - 8.1 g/dL - - -  Total Bilirubin 0.3 - 1.2 mg/dL - - -  Alkaline Phos 38 - 126 U/L - - -  AST 15 - 41 U/L - - -  ALT 17 - 63 U/L - - -    CBC Latest Ref Rng & Units 01/03/2017 01/02/2017 01/01/2017  WBC 4.0 - 10.5 K/uL 21.7(H) 20.0(H) 21.7(H)  Hemoglobin 13.0 - 17.0 g/dL 14.6 13.8 12.9(L)  Hematocrit 39.0 - 52.0 % 43.9 41.8 39.3  Platelets 150 - 400 K/uL 173 192 199    ABG    Component Value Date/Time   PHART 7.528 (H) 12/28/2016 2213   PCO2ART 49.7 (H) 12/28/2016 2213   PO2ART 103.0 12/28/2016 2213   HCO3 41.3 (H) 12/28/2016 2213   TCO2 43 12/28/2016  2213   ACIDBASEDEF 3.1 (H) 06/10/2010 0511   O2SAT 98.0 12/28/2016 2213    CXR 01/02/17 >> no acute process  Discussion:   Dyspnea >> related to COPD, OSA/OHS, morbid obesity, chronic diastolic heart failure, Rt hemidiaphragm paralysis, and deconditioning.  Assessment/Plan:  COPD with emphysema and chronic bronchitis. - continue spiriva, symbicort, and prn albuterol  Obstructive sleep apnea. - he is compliant with Bipap and reports benefit - continue Bipap 14/10 cm H2O  Chronic respiratory failure with hypoxia/hypercapnic >> related to COPD, OSA/OHS, Rt hemidiaphragm paralysis. - continue  3 liters 24/7    Patient Instructions  Follow up in 6 months    Chesley Mires, MD [place] [facility] Pager:  [phone] 02/16/2017, 12:07 PM

## 2017-02-16 NOTE — Patient Instructions (Signed)
Follow up in 6 months 

## 2017-02-17 ENCOUNTER — Other Ambulatory Visit: Payer: Self-pay | Admitting: *Deleted

## 2017-02-17 NOTE — Patient Outreach (Signed)
[facility]) Care Management  02/17/2017  William Braun 10-May-1933 [id number]  CSW received referral for SNF follow up as patient was admitted to [facility].  CSW made an initial attempt to try and contact patient today. CSW was unable to reach patient but did confirm he is at [facility] per SNF rep, plans to stay long term. CSW will plan visit to SNF next week for further assessment and determination of eligibility/participation with [facility].    Eduard Clos, MSW, [place] Worker  [place] [phone]

## 2017-02-21 ENCOUNTER — Other Ambulatory Visit: Payer: Self-pay | Admitting: *Deleted

## 2017-02-21 LAB — POCT INR: INR: 1.9 — AB (ref 0.9–1.1)

## 2017-02-21 LAB — PROTIME-INR: PROTIME: 21.4 s — AB (ref 10.0–13.8)

## 2017-02-22 ENCOUNTER — Other Ambulatory Visit: Payer: Self-pay | Admitting: *Deleted

## 2017-02-22 DIAGNOSIS — G3184 Mild cognitive impairment, so stated: Secondary | ICD-10-CM | POA: Diagnosis not present

## 2017-02-22 DIAGNOSIS — F39 Unspecified mood [affective] disorder: Secondary | ICD-10-CM | POA: Diagnosis not present

## 2017-02-22 DIAGNOSIS — G4700 Insomnia, unspecified: Secondary | ICD-10-CM | POA: Diagnosis not present

## 2017-02-22 NOTE — Patient Outreach (Signed)
[facility]) Care Management  [facility] Social Work  02/22/2017  William Braun 03/16/1933 [id number]  Subjective:  Patient in SNF rehab at [facility]. Wife at bedside.   Objective: CSW to assist patient with commuity based resources to aide in her well-being, quality of life and overall safety/needs.    Encounter Medications:  Outpatient Encounter Prescriptions as of 02/22/2017  Medication Sig  . albuterol (VENTOLIN HFA) 108 (90 BASE) MCG/ACT inhaler Inhale 2 puffs into the lungs every 6 (six) hours as needed for wheezing or shortness of breath.  . allopurinol (ZYLOPRIM) 100 MG tablet Take 1 tablet (100 mg total) by mouth daily.  Marland Kitchen aluminum-magnesium hydroxide-simethicone (MAALOX) [phone] MG/5ML SUSP Take 30 mLs by mouth every 4 (four) hours as needed (INDIGESTION OR HEARTBURN).  Marland Kitchen antiseptic oral rinse (BIOTENE) LIQD 15 mLs by Mouth Rinse route every 6 (six) hours as needed for dry mouth.  Marland Kitchen atorvastatin (LIPITOR) 10 MG tablet Take 1 tablet (10 mg total) by mouth daily at 6 PM.  . bisacodyl (DULCOLAX) 10 MG suppository Place 10 mg rectally daily as needed for moderate constipation.  . budesonide-formoterol (SYMBICORT) 160-4.5 MCG/ACT inhaler Inhale 2 puffs into the lungs 2 (two) times daily. 2 puffs twice daily  . divalproex (DEPAKOTE) 250 MG DR tablet Take 250 mg by mouth 2 (two) times daily.  Marland Kitchen HYDROcodone-acetaminophen (NORCO/VICODIN) 5-325 MG tablet Take 1 tablet by mouth every 8 (eight) hours as needed for moderate pain.  . hydrOXYzine (ATARAX/VISTARIL) 10 MG tablet Take 10 mg by mouth every 8 (eight) hours as needed for itching.  . Insulin Glargine (LANTUS SOLOSTAR) 100 UNIT/ML Solostar Pen Inject 16 Units into the skin daily at 10 pm.  . ipratropium-albuterol (DUONEB) 0.5-2.5 (3) MG/3ML SOLN Take 3 mLs by nebulization every 6 (six) hours as needed (COPD).  . magnesium hydroxide (MILK OF MAGNESIA) 400 MG/5ML suspension Take 30 mLs by mouth daily as needed for mild  constipation.  . Melatonin 3 MG TABS Take 6 mg by mouth at bedtime as needed (sleep).   . metolazone (ZAROXOLYN) 2.5 MG tablet Take 1 tablet (2.5 mg total) by mouth once a week.  . metoprolol succinate (TOPROL-XL) 25 MG 24 hr tablet Take 25 mg by mouth daily.  . mupirocin ointment (BACTROBAN) 2 % Apply 1 application topically 2 (two) times daily.  . nitroGLYCERIN (NITROSTAT) 0.4 MG SL tablet Place 1 tablet (0.4 mg total) under the tongue every 5 (five) minutes as needed for chest pain.  Marland Kitchen nystatin cream (MYCOSTATIN) Apply 1 application topically 2 (two) times daily. Apply to gluteal fold/buttocks  . polyethylene glycol (MIRALAX / GLYCOLAX) packet Take 17 g by mouth daily.  . potassium chloride SA (K-DUR,KLOR-CON) 20 MEQ tablet Take 2 tablets (40 mEq total) by mouth 2 (two) times daily.  Marland Kitchen senna-docusate (SENOKOT-S) 8.6-50 MG tablet Take 1 tablet by mouth at bedtime as needed for mild constipation.  . Sodium Phosphates (RA SALINE ENEMA RE) Place 1 Applicatorful rectally daily as needed (constipation).  Marland Kitchen tiotropium (SPIRIVA) 18 MCG inhalation capsule Place 1 capsule (18 mcg total) into inhaler and inhale daily.  Marland Kitchen torsemide (DEMADEX) 20 MG tablet Take 3 tablets (60 mg total) by mouth 2 (two) times daily.  Marland Kitchen warfarin (COUMADIN) 2.5 MG tablet Take 2.5 mg by mouth daily.   No facility-administered encounter medications on file as of 02/22/2017.     Functional Status:  In your present state of health, do you have any difficulty performing the following activities: 12/29/2016 10/31/2016  Hearing?  N -  Vision? N -  Difficulty concentrating or making decisions? Y -  Walking or climbing stairs? Y -  Dressing or bathing? Y -  Doing errands, shopping? [name]  Some recent data might be hidden    Fall/Depression Screening:  PHQ 2/9 Scores 08/22/2011  PHQ - 2 Score 1    Assessment:   CSW met with patient and his wife at SNF bedside. Patient is receiving therapies at SNF in hopes of going back home;  however, wife reports he may stay long term depending on progress.  CSW introduced self, role and provided [facility] packet for their review. CSW will plan follow up visit at SNF next week to determine their interest in participating and to get consent signed for [facility] involvement going forward.  CSW also spoke with SNF staff who indicate possible long term placement depending on progress.  Plan: CSW will plan visit next week at SNF.        Eduard Clos, MSW, [place] Worker  [place] [phone]

## 2017-02-23 ENCOUNTER — Ambulatory Visit (HOSPITAL_BASED_OUTPATIENT_CLINIC_OR_DEPARTMENT_OTHER): Payer: PPO | Admitting: Hematology & Oncology

## 2017-02-23 ENCOUNTER — Other Ambulatory Visit (HOSPITAL_BASED_OUTPATIENT_CLINIC_OR_DEPARTMENT_OTHER): Payer: PPO

## 2017-02-23 VITALS — BP 93/60 | HR 88 | Temp 98.4°F | Resp 16 | Wt 244.0 lb

## 2017-02-23 DIAGNOSIS — I509 Heart failure, unspecified: Secondary | ICD-10-CM

## 2017-02-23 DIAGNOSIS — C9110 Chronic lymphocytic leukemia of B-cell type not having achieved remission: Secondary | ICD-10-CM

## 2017-02-23 DIAGNOSIS — C9190 Lymphoid leukemia, unspecified not having achieved remission: Secondary | ICD-10-CM | POA: Diagnosis not present

## 2017-02-23 LAB — CBC WITH DIFFERENTIAL (CANCER CENTER ONLY)
HEMATOCRIT: 41.9 % (ref 38.7–49.9)
HEMOGLOBIN: 13.7 g/dL (ref 13.0–17.1)
MCH: 30.7 pg (ref 28.0–33.4)
MCHC: 32.7 g/dL (ref 32.0–35.9)
MCV: 94 fL (ref 82–98)
Platelets: 144 10*3/uL — ABNORMAL LOW (ref 145–400)
RBC: 4.46 10*6/uL (ref 4.20–5.70)
RDW: 18.2 % — ABNORMAL HIGH (ref 11.1–15.7)
WBC: 16.5 10*3/uL — ABNORMAL HIGH (ref 4.0–10.0)

## 2017-02-23 LAB — CMP (CANCER CENTER ONLY)
ALK PHOS: 72 U/L (ref 26–84)
ALT: 11 U/L (ref 10–47)
AST: 29 U/L (ref 11–38)
Albumin: 2.5 g/dL — ABNORMAL LOW (ref 3.3–5.5)
BILIRUBIN TOTAL: 0.7 mg/dL (ref 0.20–1.60)
BUN, Bld: 27 mg/dL — ABNORMAL HIGH (ref 7–22)
CALCIUM: 9.2 mg/dL (ref 8.0–10.3)
CO2: 35 mEq/L — ABNORMAL HIGH (ref 18–33)
Chloride: 98 mEq/L (ref 98–108)
Creat: 1.3 mg/dl — ABNORMAL HIGH (ref 0.6–1.2)
Glucose, Bld: 188 mg/dL — ABNORMAL HIGH (ref 73–118)
Potassium: 4.6 mEq/L (ref 3.3–4.7)
Sodium: 142 mEq/L (ref 128–145)
TOTAL PROTEIN: 7.1 g/dL (ref 6.4–8.1)

## 2017-02-23 LAB — MANUAL DIFFERENTIAL (CHCC SATELLITE)
ALC: 4.8 10*3/uL — ABNORMAL HIGH (ref 0.9–3.3)
ANC (CHCC MAN DIFF): 10.2 10*3/uL — AB (ref 1.5–6.5)
BAND NEUTROPHILS: 0 % (ref 0–10)
BASO: 1 % (ref 0–2)
Blasts: 0 % (ref 0–0)
Eos: 0 % (ref 0–7)
LYMPH: 29 % (ref 14–48)
MONO: 8 % (ref 0–13)
MYELOCYTES: 0 % (ref 0–0)
Metamyelocytes: 1 % — ABNORMAL HIGH (ref 0–0)
OTHER COMMENTS: 0
Other Cells: 0 % (ref 0–0)
PLT EST ~~LOC~~: ADEQUATE
PROMYELO: 0 % (ref 0–0)
RBC Comments: NORMAL
SEG: 61 % (ref 40–75)
Variant Lymph: 0 % (ref 0–0)
nRBC: 0 % (ref 0–0)

## 2017-02-23 NOTE — Progress Notes (Signed)
Hematology and Oncology Follow Up Visit  William Braun [id number] [birth year] 83 y.o. 02/23/2017   Principle Diagnosis:  CLL-stage A  Current Therapy:   Observation    Interim History:  William Braun is here today with his wife for a follow-up. It looks like he is holding his own. He has not been hospitalized for almost 2 months. He was in the hospital back in April with sepsis.  He has multiple problems. CLL is probably the least of his issues.  He is at a rehabilitation center. Hopefully, he can improve his performance status.  Much or how well he is eating.  With his labs today, his albumin is already down to 2.5. I think this is clearly a problem for him. If this continues to get lower, I think he will run into more problems.   He has had no bleeding. He does have some excoriations on his left lower leg. He said he bumped into something. He does not remover what he bumped into.   He's had no fever. He's had no cough. According to his wife, he has had a little bit of a cough.  Currently, his performance status is ECOG 3.  Medications:  Allergies as of 02/23/2017      Reactions   Quinine Palpitations, Other (See Comments)   Seizure    Uloric [febuxostat] Rash      Medication List       Accurate as of 02/23/17  2:12 PM. Always use your most recent med list.          albuterol 108 (90 Base) MCG/ACT inhaler Commonly known as:  VENTOLIN HFA Inhale 2 puffs into the lungs every 6 (six) hours as needed for wheezing or shortness of breath.   allopurinol 100 MG tablet Commonly known as:  ZYLOPRIM Take 1 tablet (100 mg total) by mouth daily.   aluminum-magnesium hydroxide-simethicone [phone] MG/5ML Susp Commonly known as:  MAALOX Take 30 mLs by mouth every 4 (four) hours as needed (INDIGESTION OR HEARTBURN).   antiseptic oral rinse Liqd 15 mLs by Mouth Rinse route every 6 (six) hours as needed for dry mouth.   atorvastatin 10 MG tablet Commonly known as:  LIPITOR Take 1  tablet (10 mg total) by mouth daily at 6 PM.   bisacodyl 10 MG suppository Commonly known as:  DULCOLAX Place 10 mg rectally daily as needed for moderate constipation.   budesonide-formoterol 160-4.5 MCG/ACT inhaler Commonly known as:  SYMBICORT Inhale 2 puffs into the lungs 2 (two) times daily. 2 puffs twice daily   divalproex 250 MG DR tablet Commonly known as:  DEPAKOTE Take 250 mg by mouth 2 (two) times daily.   HYDROcodone-acetaminophen 5-325 MG tablet Commonly known as:  NORCO/VICODIN Take 1 tablet by mouth every 8 (eight) hours as needed for moderate pain.   hydrOXYzine 10 MG tablet Commonly known as:  ATARAX/VISTARIL Take 10 mg by mouth every 8 (eight) hours as needed for itching.   ipratropium-albuterol 0.5-2.5 (3) MG/3ML Soln Commonly known as:  DUONEB Take 3 mLs by nebulization every 6 (six) hours as needed (COPD).   LANTUS SOLOSTAR 100 UNIT/ML Solostar Pen Generic drug:  Insulin Glargine Inject 16 Units into the skin daily at 10 pm.   magnesium hydroxide 400 MG/5ML suspension Commonly known as:  MILK OF MAGNESIA Take 30 mLs by mouth daily as needed for mild constipation.   Melatonin 3 MG Tabs Take 6 mg by mouth at bedtime as needed (sleep).   metolazone 2.5 MG tablet  Commonly known as:  ZAROXOLYN Take 1 tablet (2.5 mg total) by mouth once a week.   metoprolol succinate 25 MG 24 hr tablet Commonly known as:  TOPROL-XL Take 25 mg by mouth daily.   mupirocin ointment 2 % Commonly known as:  BACTROBAN Apply 1 application topically 2 (two) times daily.   nitroGLYCERIN 0.4 MG SL tablet Commonly known as:  NITROSTAT Place 1 tablet (0.4 mg total) under the tongue every 5 (five) minutes as needed for chest pain.   nystatin cream Commonly known as:  MYCOSTATIN Apply 1 application topically 2 (two) times daily. Apply to gluteal fold/buttocks   polyethylene glycol packet Commonly known as:  MIRALAX / GLYCOLAX Take 17 g by mouth daily.   potassium chloride  SA 20 MEQ tablet Commonly known as:  K-DUR,KLOR-CON Take 2 tablets (40 mEq total) by mouth 2 (two) times daily.   RA SALINE ENEMA RE Place 1 Applicatorful rectally daily as needed (constipation).   senna-docusate 8.6-50 MG tablet Commonly known as:  Senokot-S Take 1 tablet by mouth at bedtime as needed for mild constipation.   tiotropium 18 MCG inhalation capsule Commonly known as:  SPIRIVA Place 1 capsule (18 mcg total) into inhaler and inhale daily.   torsemide 20 MG tablet Commonly known as:  DEMADEX Take 3 tablets (60 mg total) by mouth 2 (two) times daily.   warfarin 2.5 MG tablet Commonly known as:  COUMADIN Take 2.5 mg by mouth daily.       Allergies:  Allergies  Allergen Reactions  . Quinine Palpitations and Other (See Comments)    Seizure   . Uloric [Febuxostat] Rash    Past Medical History, Surgical history, Social history, and Family History were reviewed and updated.  Review of Systems: All other 10 point review of systems is negative.   Physical Exam:  weight is 244 lb (110.7 kg). His oral temperature is 98.4 F (36.9 C). His blood pressure is 93/60 and his pulse is 88. His respiration is 16 and oxygen saturation is 100%.   Wt Readings from Last 3 Encounters:  02/23/17 244 lb (110.7 kg)  02/16/17 246 lb (111.6 kg)  02/14/17 244 lb (110.7 kg)   Elderly, chronically ill-appearing white male. Head and neck exam shows no ocular or oral lesions. He has no adenopathy in the neck. He has no scleral icterus. Lungs are with decreased breath sounds at the bases. He has decent air movement bilaterally in the upper lungs. He has some crackles at the bases. Cardiac exam is a irregular rate and rhythm consistent with atrial fibrillation. Abdomen is soft. He is moderately obese. He has no fluid wave. There is no palpable liver or spleen tip. Extremities shows chronic 2+ edema in his lower legs. He has arthritic changes in his joints. He has some small excoriations on the  anterior portion of his left lower leg. Neurological exam shows no focal neurological deficits.   Lab Results  Component Value Date   WBC 16.5 (H) 02/23/2017   HGB 13.7 02/23/2017   HCT 41.9 02/23/2017   MCV 94 02/23/2017   PLT 144 (L) 02/23/2017   Lab Results  Component Value Date   FERRITIN 157 06/22/2010   IRON 15 (L) 06/22/2010   TIBC 182 (L) 06/22/2010   UIBC 167 06/22/2010   IRONPCTSAT 8 (L) 06/22/2010   Lab Results  Component Value Date   RETICCTPCT 2.8 10/30/2016   RBC 4.46 02/23/2017   Lab Results  Component Value Date   KPAFRELGTCHN 10.70 (  H) 11/17/2014   LAMBDASER 7.06 (H) 11/17/2014   KAPLAMBRATIO 0.97 09/29/2016   Lab Results  Component Value Date   IGGSERUM 1,163 11/17/2016   IGA 271 11/01/2016   IGMSERUM 54 11/17/2016   Lab Results  Component Value Date   TOTALPROTELP 7.8 04/23/2014   ALBUMINELP 45.6 (L) 04/23/2014   A1GS 5.7 (H) 04/23/2014   A2GS 10.1 04/23/2014   BETS 7.0 04/23/2014   BETA2SER 6.0 04/23/2014   GAMS 25.6 (H) 04/23/2014   MSPIKE Not Observed 10/21/2015   SPEI * 04/23/2014     Chemistry      Component Value Date/Time   NA 142 02/23/2017 1313   NA 142 04/20/2016 1143   K 4.6 02/23/2017 1313   K 4.4 04/20/2016 1143   CL 98 02/23/2017 1313   CO2 35 (H) 02/23/2017 1313   CO2 31 (H) 04/20/2016 1143   BUN 27 (H) 02/23/2017 1313   BUN 30.0 (H) 04/20/2016 1143   CREATININE 1.3 (H) 02/23/2017 1313   CREATININE 1.8 (H) 04/20/2016 1143   GLU 45 02/06/2017      Component Value Date/Time   CALCIUM 9.2 02/23/2017 1313   CALCIUM 10.0 04/20/2016 1143   ALKPHOS 72 02/23/2017 1313   ALKPHOS 69 04/20/2016 1143   AST 29 02/23/2017 1313   AST 16 04/20/2016 1143   ALT 11 02/23/2017 1313   ALT 11 04/20/2016 1143   BILITOT 0.70 02/23/2017 1313   BILITOT 0.83 04/20/2016 1143     Impression and Plan: William Braun is 83 yo male with stage A CLL documented by flow cytometry. He has multiple health issues including CHF, which clearly is his  biggest issue.   His nutritional state is lacking. His albumin is only 2.5. I think this is going be a big problem for him. He can certainly affect his Coumadin. If effect other medications.  I think he needs to try to be on nutritional supplements. I think Boost orEnsure would be reasonable. I think if he can do this 3 times a day, this may help stabilize or possibly even improve his nutritional state.   Again, his CLL really is not an issue.  As nice as he is, I just don't think that we have to get him back to our office. I does don't see that we are going to be actively involved with his overall health care. I'm sure that he has several other doctors who are doing a whole lot more than we will.  I will pray hard for he and his wife. They're both very very nice.    Volanda Napoleon, MD 6/7/20182:12 PM

## 2017-02-24 ENCOUNTER — Encounter (HOSPITAL_COMMUNITY): Payer: Self-pay

## 2017-02-24 ENCOUNTER — Ambulatory Visit (HOSPITAL_COMMUNITY)
Admission: RE | Admit: 2017-02-24 | Discharge: 2017-02-24 | Disposition: A | Discharge status: Home / Self Care | Payer: PPO | Source: Ambulatory Visit | Attending: Cardiology | Admitting: Cardiology

## 2017-02-24 VITALS — BP 90/58 | HR 84 | Wt 244.0 lb

## 2017-02-24 DIAGNOSIS — Z87891 Personal history of nicotine dependence: Secondary | ICD-10-CM | POA: Diagnosis not present

## 2017-02-24 DIAGNOSIS — J449 Chronic obstructive pulmonary disease, unspecified: Secondary | ICD-10-CM | POA: Diagnosis not present

## 2017-02-24 DIAGNOSIS — Z8673 Personal history of transient ischemic attack (TIA), and cerebral infarction without residual deficits: Secondary | ICD-10-CM | POA: Insufficient documentation

## 2017-02-24 DIAGNOSIS — I482 Chronic atrial fibrillation, unspecified: Secondary | ICD-10-CM

## 2017-02-24 DIAGNOSIS — I130 Hypertensive heart and chronic kidney disease with heart failure and stage 1 through stage 4 chronic kidney disease, or unspecified chronic kidney disease: Secondary | ICD-10-CM | POA: Insufficient documentation

## 2017-02-24 DIAGNOSIS — Z7901 Long term (current) use of anticoagulants: Secondary | ICD-10-CM | POA: Insufficient documentation

## 2017-02-24 DIAGNOSIS — Z96651 Presence of right artificial knee joint: Secondary | ICD-10-CM | POA: Insufficient documentation

## 2017-02-24 DIAGNOSIS — Z794 Long term (current) use of insulin: Secondary | ICD-10-CM | POA: Insufficient documentation

## 2017-02-24 DIAGNOSIS — M6281 Muscle weakness (generalized): Secondary | ICD-10-CM | POA: Diagnosis not present

## 2017-02-24 DIAGNOSIS — E785 Hyperlipidemia, unspecified: Secondary | ICD-10-CM | POA: Diagnosis not present

## 2017-02-24 DIAGNOSIS — E1122 Type 2 diabetes mellitus with diabetic chronic kidney disease: Secondary | ICD-10-CM | POA: Diagnosis not present

## 2017-02-24 DIAGNOSIS — Z6831 Body mass index (BMI) 31.0-31.9, adult: Secondary | ICD-10-CM | POA: Insufficient documentation

## 2017-02-24 DIAGNOSIS — I5032 Chronic diastolic (congestive) heart failure: Secondary | ICD-10-CM

## 2017-02-24 DIAGNOSIS — E662 Morbid (severe) obesity with alveolar hypoventilation: Secondary | ICD-10-CM | POA: Insufficient documentation

## 2017-02-24 DIAGNOSIS — R1312 Dysphagia, oropharyngeal phase: Secondary | ICD-10-CM | POA: Diagnosis not present

## 2017-02-24 DIAGNOSIS — Z79899 Other long term (current) drug therapy: Secondary | ICD-10-CM | POA: Insufficient documentation

## 2017-02-24 DIAGNOSIS — C9110 Chronic lymphocytic leukemia of B-cell type not having achieved remission: Secondary | ICD-10-CM | POA: Insufficient documentation

## 2017-02-24 DIAGNOSIS — M1712 Unilateral primary osteoarthritis, left knee: Secondary | ICD-10-CM | POA: Insufficient documentation

## 2017-02-24 DIAGNOSIS — N183 Chronic kidney disease, stage 3 (moderate): Secondary | ICD-10-CM | POA: Insufficient documentation

## 2017-02-24 LAB — KAPPA/LAMBDA LIGHT CHAINS
Ig Kappa Free Light Chain: 106.5 mg/L — ABNORMAL HIGH (ref 3.3–19.4)
Ig Lambda Free Light Chain: 121.6 mg/L — ABNORMAL HIGH (ref 5.7–26.3)
KAPPA/LAMBDA FLC RATIO: 0.88 (ref 0.26–1.65)

## 2017-02-24 LAB — IGG, IGA, IGM
IGM (IMMUNOGLOBIN M), SRM: 61 mg/dL (ref 15–143)
IgA, Qn, Serum: 428 mg/dL (ref 61–437)
IgG, Qn, Serum: 1737 mg/dL — ABNORMAL HIGH (ref 700–1600)

## 2017-02-24 NOTE — Patient Instructions (Signed)
Fluid restriction to less than 2 liters/day.  Nutritional supplement (Boost or ensure) is recommended for you.   Follow up in 3 Months, we will contact you to schedule appointment.

## 2017-02-26 NOTE — Progress Notes (Signed)
Patient ID: William Braun, male   DOB: Nov 17, 1932, 83 y.o.   MRN: [id number] PCP: Dr. Michail Sermon Pulmonary: Dr Halford Chessman Cardiology: Dr. Aundra Dubin  83 yo with h/o HTN, CVA, diabetes, obesity-hypoventilation syndrome, diastolic CHF, and chronic atrial fibrillation presents for cardiology followup.  He has OHS and uses BIPAP at night and oxygen during the day.  He has atrial fibrillation with reasonable rate control and is on coumadin.   His legs continue to feel weak (normal ABIs in 10/16).    He was admitted in 12/17 with COPD exacerbation/LLL PNA. He was admitted again in 2/18 with UTI/encephalopathy and AKI.  Torsemide was decreased to 40 mg bid.   He was admitted again in 4/18 with UTI.  He is now living in a SNF.  Wears 3 liters oxygen daily and using CPAP on at night.  He is wheelchair-bound generally but is able to use his walker some around his room.  He is short of breath walking more than 10-15 feet. Limited considerably by knee pain. No orthopnea/PND. No chest pain.  No falls, no lightheadedness. No BRBPR/melena.    Labs (2/10): creatinine 1.15, BNP 73, TSH normal  Labs (9/12): K 4.1, creatinine 1.4, BNP 90 Labs (11/12): K 4.2, creatinine 1.9 Labs (2/13): K 4.2, creatinine 1.3, proBNP 49, LDL 39, HDL 35 Labs (4/13): K 4, creatinine 1.5, BNP 48 Labs (3/14): K 4 => 3.9, creatinine 1.4 => 1.6 Labs (4/14): K 4, creatinine 1.5 Labs (5/15): K 4.2, creatinine 1.4, BNP 49 Labs (8/15): K 3.7, creatinine 1.4 Labs (10/15): K 3.6, creatinine 1.29 Labs (8/16): K 4.7, creatinine 1.34, HCT 47.9 Labs (11/16): K 4, creatinine 1.23 => 1.39, BNP 78 Labs (2/17): K 3.5, creatinine 1.85 Labs (3/17): K 4.8, creatinine 1.69, BNP 56.5 Labs (8/17): K 4.5, creatinine 1.73 Labs (3/18): K 4, creatinine 1.5, hgb 14.5 Labs (6/18): K 4.6, creatinine 1.3  Allergies (verified):  1) ! Quinine   Past Medical History:  1. Atrial Fibrillation: apparently developed post-op right TKR in 2/10. Pt was started on coumadin. Now  chronic.  2. Diabetes Type 2  3. Hyperlipidemia  4. Hypertension  5. Cerebrovascular Disease-CVA-2008  6. Obesity  7. Osteoarthritis left knee, s/p R TKR  8. History of thrombocytopenia  9. Diastolic CHF: Echo (9/79) with EF 55%, moderately dilated RV with moderate RV systolic dysfunction, PA systolic pressure 36 mmHg.  Echo (3/14) with EF 60-65%, moderate LVH, moderate RV dilation with normal systolic function, PA systolic pressure 89-21 mmHg.  Echo (11/16) with EF 60-65%, mild LVH, mild aortic stenosis, mildly dilated aortic root 4.3 cm, PASP 71 mmHg.  10. Obesity hypoventilation syndrome/OSA: BIPAP at night, O2 during the day with exertion. PFTs (1/15) with FEV1 49%, ratio 97%, TLC 59%, DLCO 49% => mixed obstructive/restrictive.  11. Prolonged hospitalization in fall 2011 with Strep agalactiae bacteremia, MRSA PNA, and septic shock.  12. Lexiscan myoview (11/12) with EF 65%, no ischemia or infarction.  13. 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  14. CKD 15. CLL: Dr. Marin Olp 16. ABIs (7/15) normal.  ABIs (10/16) normal.   Family History:  Father: deceased MVA  Mother: deceased MVA   Social History:  Married  Tobacco Use - Former. 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   Current Outpatient Prescriptions  Medication Sig Dispense Refill  . albuterol (VENTOLIN HFA) 108 (90 BASE) MCG/ACT inhaler Inhale 2 puffs into the lungs every 6 (six)  hours as needed for wheezing or shortness of breath. 3 Inhaler 1  . allopurinol (ZYLOPRIM) 100 MG tablet Take 1 tablet (100 mg total) by mouth daily. 30 tablet 6  . aluminum-magnesium hydroxide-simethicone (MAALOX) [phone] MG/5ML SUSP Take 30 mLs by mouth every 4 (four) hours as needed (INDIGESTION OR HEARTBURN).    Marland Kitchen antiseptic oral rinse (BIOTENE) LIQD 15 mLs by Mouth Rinse route every 6 (six) hours as needed for dry mouth.    Marland Kitchen atorvastatin (LIPITOR) 10 MG  tablet Take 1 tablet (10 mg total) by mouth daily at 6 PM. 30 tablet 0  . bisacodyl (DULCOLAX) 10 MG suppository Place 10 mg rectally daily as needed for moderate constipation.    . budesonide-formoterol (SYMBICORT) 160-4.5 MCG/ACT inhaler Inhale 2 puffs into the lungs 2 (two) times daily. 2 puffs twice daily 3 Inhaler 3  . divalproex (DEPAKOTE) 250 MG DR tablet Take 250 mg by mouth 2 (two) times daily.    Marland Kitchen HYDROcodone-acetaminophen (NORCO/VICODIN) 5-325 MG tablet Take 1 tablet by mouth every 8 (eight) hours as needed for moderate pain.    . hydrOXYzine (ATARAX/VISTARIL) 10 MG tablet Take 10 mg by mouth every 8 (eight) hours as needed for itching.    . Insulin Glargine (LANTUS SOLOSTAR) 100 UNIT/ML Solostar Pen Inject 16 Units into the skin daily at 10 pm.    . ipratropium-albuterol (DUONEB) 0.5-2.5 (3) MG/3ML SOLN Take 3 mLs by nebulization every 6 (six) hours as needed (COPD).    . magnesium hydroxide (MILK OF MAGNESIA) 400 MG/5ML suspension Take 30 mLs by mouth daily as needed for mild constipation.    . Melatonin 3 MG TABS Take 6 mg by mouth at bedtime as needed (sleep).     . metolazone (ZAROXOLYN) 2.5 MG tablet Take 1 tablet (2.5 mg total) by mouth once a week. 5 tablet 0  . metoprolol succinate (TOPROL-XL) 25 MG 24 hr tablet Take 25 mg by mouth daily.    . mupirocin ointment (BACTROBAN) 2 % Apply 1 application topically 2 (two) times daily. 22 g 0  . nystatin cream (MYCOSTATIN) Apply 1 application topically 2 (two) times daily. Apply to gluteal fold/buttocks    . polyethylene glycol (MIRALAX / GLYCOLAX) packet Take 17 g by mouth daily. 14 each 0  . potassium chloride SA (K-DUR,KLOR-CON) 20 MEQ tablet Take 2 tablets (40 mEq total) by mouth 2 (two) times daily. 120 tablet 6  . senna-docusate (SENOKOT-S) 8.6-50 MG tablet Take 1 tablet by mouth at bedtime as needed for mild constipation. 30 tablet 0  . Sodium Phosphates (RA SALINE ENEMA RE) Place 1 Applicatorful rectally daily as needed  (constipation).    Marland Kitchen tiotropium (SPIRIVA) 18 MCG inhalation capsule Place 1 capsule (18 mcg total) into inhaler and inhale daily. 90 capsule 3  . torsemide (DEMADEX) 20 MG tablet Take 3 tablets (60 mg total) by mouth 2 (two) times daily. 180 tablet 6  . warfarin (COUMADIN) 2.5 MG tablet Take 2.5 mg by mouth daily.    . nitroGLYCERIN (NITROSTAT) 0.4 MG SL tablet Place 1 tablet (0.4 mg total) under the tongue every 5 (five) minutes as needed for chest pain. (Patient not taking: Reported on 02/24/2017) 25 tablet 3   No current facility-administered medications for this encounter.     BP (!) 90/58   Pulse 84   Wt 244 lb (110.7 kg)   SpO2 100% Comment: on 3L of O2  BMI 31.33 kg/m  General: NAD, obese. Arrived in a scooter.  Neck: Thick, no JVD,  no thyromegaly or thyroid nodule.  Lungs: Mild crackles at bases bilaterally.  Distant breath sounds.  CV: Nondisplaced PMI.  Heart irregular S1/S2, no S3/S4, 1/6 SEM RUSB. No edema.  No carotid bruit.  Unable to feel pedal pulses. Abdomen: Soft, nontender, no hepatosplenomegaly, no distention.  Neurologic: Alert and oriented x 3.  Psych: Normal affect. Extremities: No clubbing or cyanosis.   Assessment/Plan: 1. Chronic diastolic CHF: Probably with a component of pulmonary arterial HTN from OHS/OSA with right heart failure.  He has NYHA class IIIb symptoms, stable. Suspect OSA/OHS plays a significant role in his dyspnea. Volume status looks ok on current torsemide.  - Continue torsemide 60 mg bid.  Recent BMET stable.   2. OHS/OSA: Continue nocturnal Bipap and oxygen during the day.   3. Atrial fibrillation: Chronic.  Reasonable rate control on Toprol XL.  Continue coumadin.  4. HTN: BP stable. 5. CKD: Stage III.  Recent BMET stable.   Follow up in 3 months.    [name] 02/26/2017

## 2017-02-27 ENCOUNTER — Other Ambulatory Visit: Payer: Self-pay | Admitting: *Deleted

## 2017-02-28 ENCOUNTER — Encounter: Payer: Self-pay | Admitting: Internal Medicine

## 2017-02-28 NOTE — Patient Outreach (Signed)
[name] [facility]) Care Management  02/28/2017  William Braun 1933-02-05 [id number]   CSW visited patient at [facility] rehab. He reports progress with therapies but no tentative plans for dc. CSW also spoke with wife who has not been able to review [facility] packet- discussed [facility] role/program with her again and she will review. CSW will plan a f/u visit to SNF Thursday to visit and determine if they want to participate in [facility] program/services.   Eduard Clos, MSW, [place] Worker  [name] [phone]

## 2017-03-02 ENCOUNTER — Other Ambulatory Visit: Payer: Self-pay | Admitting: *Deleted

## 2017-03-02 NOTE — Patient Outreach (Signed)
[place] [facility]) Care Management  03/02/2017  William Braun 1932-10-31 [id number]   CSW arrived at [facility] SNF today to meet with patient and his wife to further introduce self and [facility] program. Wife has not had a chacne to review the packet CSW left with her last week.  Per wife, there has not been any dc planning discussions. Per SNF RN, he is not eating or drinking well; "he is on fluid restrictions" .  Wife and I noticed his face was flushed;  Asked RN to check temp; 99.7.  CSW will plan final f/u visit next week to determine if they are planning short term SNF -vs- long term placement.    Eduard Clos, MSW, [place] Worker  [place] [phone]

## 2017-03-03 ENCOUNTER — Encounter: Payer: Self-pay | Admitting: Adult Health

## 2017-03-03 ENCOUNTER — Non-Acute Institutional Stay (SKILLED_NURSING_FACILITY): Payer: PPO | Admitting: Adult Health

## 2017-03-03 DIAGNOSIS — F39 Unspecified mood [affective] disorder: Secondary | ICD-10-CM

## 2017-03-03 DIAGNOSIS — G4700 Insomnia, unspecified: Secondary | ICD-10-CM | POA: Diagnosis not present

## 2017-03-03 DIAGNOSIS — K5901 Slow transit constipation: Secondary | ICD-10-CM

## 2017-03-03 DIAGNOSIS — M1A0410 Idiopathic chronic gout, right hand, without tophus (tophi): Secondary | ICD-10-CM

## 2017-03-03 DIAGNOSIS — J438 Other emphysema: Secondary | ICD-10-CM

## 2017-03-03 DIAGNOSIS — I482 Chronic atrial fibrillation, unspecified: Secondary | ICD-10-CM

## 2017-03-03 DIAGNOSIS — I5032 Chronic diastolic (congestive) heart failure: Secondary | ICD-10-CM | POA: Diagnosis not present

## 2017-03-03 DIAGNOSIS — N183 Chronic kidney disease, stage 3 unspecified: Secondary | ICD-10-CM

## 2017-03-03 DIAGNOSIS — E785 Hyperlipidemia, unspecified: Secondary | ICD-10-CM | POA: Diagnosis not present

## 2017-03-03 DIAGNOSIS — M1000 Idiopathic gout, unspecified site: Secondary | ICD-10-CM

## 2017-03-03 DIAGNOSIS — I10 Essential (primary) hypertension: Secondary | ICD-10-CM

## 2017-03-03 DIAGNOSIS — G4733 Obstructive sleep apnea (adult) (pediatric): Secondary | ICD-10-CM

## 2017-03-03 DIAGNOSIS — E876 Hypokalemia: Secondary | ICD-10-CM

## 2017-03-03 DIAGNOSIS — D72829 Elevated white blood cell count, unspecified: Secondary | ICD-10-CM

## 2017-03-03 DIAGNOSIS — E1142 Type 2 diabetes mellitus with diabetic polyneuropathy: Secondary | ICD-10-CM | POA: Diagnosis not present

## 2017-03-03 LAB — CBC AND DIFFERENTIAL
HCT: 44 (ref 41–53)
Hemoglobin: 14.3 (ref 13.5–17.5)
Neutrophils Absolute: 9
PLATELETS: 192 (ref 150–399)
WBC: 17.9

## 2017-03-03 NOTE — Progress Notes (Signed)
DATE:  03/03/2017   MRN:  [id number]  BIRTHDAY: 04/08/1933  Facility:  Nursing Home Location:  [facility] Room Number: 113-B  LEVEL OF CARE:  SNF (31)  Contact Information    Name Relation Home Work Mobile   Burack,Helen Spouse [phone]  [phone]   [place] Son [phone]         Code Status History    Date Active Date Inactive Code Status Order ID Comments User Context   12/29/2016  2:15 AM 01/03/2017  8:09 PM DNR [id number]  Rise Patience, MD Inpatient   10/30/2016  2:19 AM 11/07/2016  5:08 PM Full Code [id number]  Rexanne Mano, MD ED   09/14/2016 10:33 PM 09/23/2016  9:40 PM Full Code [id number]  Ivor Costa, MD ED    Questions for Most Recent Historical Code Status (Order [id number])    Question Answer Comment   In the event of cardiac or respiratory ARREST Do not call a "code blue"    In the event of cardiac or respiratory ARREST Do not perform Intubation, CPR, defibrillation or ACLS    In the event of cardiac or respiratory ARREST Use medication by any route, position, wound care, and other measures to relive pain and suffering. May use oxygen, suction and manual treatment of airway obstruction as needed for comfort.        Chief Complaint  Patient presents with  . Medical Management of Chronic Issues    Routine    HISTORY OF PRESENT ILLNESS:  This is an 62-YO male seen for a routine visit.  He is a long-term care resident at [facility] [place]. He has PMH of Chronic diastolic CHF, atrial fibrillation, diabetes mellitus, chronic kidney disease, sleep apnea and hypertension. Upon lab review, it was noted that his wbc 17.9 and previously was 14.9. No reported fever and UA CNS was done and awaiting result. He was recently started on allopurinol for gout. Toprol XL dosage was decreased to 25 mg daily and Lantus was increased to 16 units daily at bedtime. He was seen in his room today and was complaining of right hand pain  with trace of edema.   PAST MEDICAL HISTORY:  Past Medical History:  Diagnosis Date  . Atrial fibrillation ([place])   . Cerebrovascular disease   . CHF (congestive heart failure) ([place])   . CLL (chronic lymphocytic leukemia) ([place]) 11/17/2014  . COPD (chronic obstructive pulmonary disease) ([place])   . History of thrombocytopenia   . Obesity   . Osteoarthritis   . Other and unspecified hyperlipidemia   . Type II or unspecified type diabetes mellitus without mention of complication, not stated as uncontrolled   . Unspecified essential hypertension      CURRENT MEDICATIONS: Reviewed  Patient's Medications  New Prescriptions   No medications on file  Previous Medications   ALBUTEROL (VENTOLIN HFA) 108 (90 BASE) MCG/ACT INHALER    Inhale 2 puffs into the lungs every 6 (six) hours as needed for wheezing or shortness of breath.   ALLOPURINOL (ZYLOPRIM) 100 MG TABLET    Take 1 tablet (100 mg total) by mouth daily.   ALUMINUM-MAGNESIUM HYDROXIDE-SIMETHICONE (MAALOX) [phone] MG/5ML SUSP    Take 30 mLs by mouth every 4 (four) hours as needed (INDIGESTION OR HEARTBURN).   AMINO ACIDS-PROTEIN HYDROLYS (FEEDING SUPPLEMENT, PRO-STAT SUGAR FREE 64,) LIQD    Take 30 mLs by mouth daily.   ANTISEPTIC ORAL RINSE (BIOTENE) LIQD    15 mLs by Mouth  Rinse route every 6 (six) hours as needed for dry mouth.   ATORVASTATIN (LIPITOR) 10 MG TABLET    Take 1 tablet (10 mg total) by mouth daily at 6 PM.   BISACODYL (DULCOLAX) 10 MG SUPPOSITORY    Place 10 mg rectally daily as needed for moderate constipation.   BUDESONIDE-FORMOTEROL (SYMBICORT) 160-4.5 MCG/ACT INHALER    Inhale 2 puffs into the lungs 2 (two) times daily. 2 puffs twice daily   DIVALPROEX (DEPAKOTE) 250 MG DR TABLET    Take 250 mg by mouth 2 (two) times daily.   HYDROCODONE-ACETAMINOPHEN (NORCO/VICODIN) 5-325 MG TABLET    Take 1 tablet by mouth every 8 (eight) hours as needed for moderate pain.   INSULIN GLARGINE (LANTUS SOLOSTAR) 100 UNIT/ML SOLOSTAR  PEN    Inject 16 Units into the skin daily at 10 pm.   IPRATROPIUM-ALBUTEROL (DUONEB) 0.5-2.5 (3) MG/3ML SOLN    Take 3 mLs by nebulization every 6 (six) hours as needed (COPD).   MAGNESIUM HYDROXIDE (MILK OF MAGNESIA) 400 MG/5ML SUSPENSION    Take 30 mLs by mouth every 4 (four) hours as needed for heartburn or indigestion.    MELATONIN 3 MG TABS    Take 6 mg by mouth at bedtime as needed (sleep).    METOLAZONE (ZAROXOLYN) 2.5 MG TABLET    Take 1 tablet (2.5 mg total) by mouth once a week.   METOPROLOL SUCCINATE (TOPROL-XL) 25 MG 24 HR TABLET    Take 25 mg by mouth daily.    NITROGLYCERIN (NITROSTAT) 0.4 MG SL TABLET    Place 1 tablet (0.4 mg total) under the tongue every 5 (five) minutes as needed for chest pain.   NYSTATIN CREAM (MYCOSTATIN)    Apply 1 application topically 2 (two) times daily. Apply to gluteal fold/buttocks   POLYETHYLENE GLYCOL (MIRALAX / GLYCOLAX) PACKET    Take 17 g by mouth daily.   POTASSIUM CHLORIDE SA (K-DUR,KLOR-CON) 20 MEQ TABLET    Take 2 tablets (40 mEq total) by mouth 2 (two) times daily.   SENNA-DOCUSATE (SENOKOT-S) 8.6-50 MG TABLET    Take 1 tablet by mouth at bedtime as needed for mild constipation.   SODIUM PHOSPHATES (RA SALINE ENEMA RE)    Place 1 Applicatorful rectally daily as needed (constipation).   TIOTROPIUM (SPIRIVA) 18 MCG INHALATION CAPSULE    Place 1 capsule (18 mcg total) into inhaler and inhale daily.   TORSEMIDE (DEMADEX) 20 MG TABLET    Take 3 tablets (60 mg total) by mouth 2 (two) times daily.   WARFARIN (COUMADIN) 2.5 MG TABLET    Take 2.5 mg by mouth. M-W-F-Sun   WARFARIN (COUMADIN) 5 MG TABLET    Take 5 mg by mouth. Take Tue, Thu, Sat  Modified Medications   No medications on file  Discontinued Medications   HYDROXYZINE (ATARAX/VISTARIL) 10 MG TABLET    Take 10 mg by mouth every 8 (eight) hours as needed for itching.   MUPIROCIN OINTMENT (BACTROBAN) 2 %    Apply 1 application topically 2 (two) times daily.   WARFARIN (COUMADIN) 2.5 MG  TABLET    Take 2.5 mg by mouth daily.     Allergies  Allergen Reactions  . Quinine Palpitations and Other (See Comments)    Seizure   . Uloric [Febuxostat] Rash     REVIEW OF SYSTEMS:  GENERAL: no change in appetite, no fatigue, no weight changes, no fever, chills or weakness EYES: Denies change in vision, dry eyes, eye pain, itching or discharge EARS:  Denies change in hearing, ringing in ears, or earache NOSE: Denies nasal congestion or epistaxis MOUTH and THROAT: Denies oral discomfort, gingival pain or bleeding, pain from teeth or hoarseness   RESPIRATORY: no cough, SOB, DOE, wheezing, hemoptysis CARDIAC: no chest pain, edema or palpitations GI: no abdominal pain, diarrhea, constipation, heart burn, nausea or vomiting GU: Denies dysuria, frequency, hematuria, incontinence, or discharge MUSCULOSKELETAL: + Pain on the right hand  PSYCHIATRIC: Denies feeling of depression or anxiety. No report of hallucinations, insomnia, paranoia, or agitation     PHYSICAL EXAMINATION  GENERAL APPEARANCE: Well nourished. In no acute distress. Obese SKIN:  Bilateral shins with discoloration of skin, right knee with dry scab, right foot second, third and fifth toe with black scabs  HEAD: Normal in size and contour. No evidence of trauma EYES: Lids open and close normally. No blepharitis, entropion or ectropion. PERRL. Conjunctivae are clear and sclerae are white. Lenses are without opacity EARS: Pinnae are normal. Patient hears normal voice tunes of the examiner MOUTH and THROAT: Lips are without lesions. Oral mucosa is moist and without lesions. Tongue is normal in shape, size, and color and without lesions NECK: supple, trachea midline, no neck masses, no thyroid tenderness, no thyromegaly LYMPHATICS: no LAN in the neck, no supraclavicular LAN RESPIRATORY: breathing is even & unlabored, BS CTAB CARDIAC: RRR, no murmur,no extra heart sounds GI: abdomen soft, normal BS, no masses, no  tenderness, no hepatomegaly, no splenomegaly EXTREMITIES:  Able to move 4 extremities, limited ROM on right hand with trace edema PSYCHIATRIC: Alert to self, disoriented to time and place. Affect and behavior are appropriate   LABS/RADIOLOGY: Labs reviewed: Basic Metabolic Panel:  Recent Labs  10/30/16 0238  12/30/16 0350 12/31/16 0322  01/02/17 0346 01/03/17 0404 02/06/17 02/23/17 1313  NA 134*  < > 139 135  < > 139 140 142 142  K 3.8  < > 4.0 4.0  < > 3.3* 3.4* 3.7 4.6  CL 83*  < > 93* 88*  < > 90* 86*  --  98  CO2 33*  < > 36* 36*  < > 41* 44*  --  35*  GLUCOSE 376*  < > 175* 276*  < > 123* 173*  --  188*  BUN 105*  < > 48* 60*  < > 51* 57* 35* 27*  CREATININE 2.21*  < > 1.99* 1.66*  < > 1.16 1.28* 1.7* 1.3*  CALCIUM 8.5*  < > 8.3* 8.5*  < > 8.7* 8.9  --  9.2  MG 1.9  < > 2.2 2.6*  --   --  2.3  --   --   PHOS 4.6  --   --   --   --   --   --   --   --   < > = values in this interval not displayed. Liver Function Tests:  Recent Labs  12/30/16 0350 12/31/16 0322 02/23/17 1313  AST 18 19 29   ALT 11* 14* 11  ALKPHOS 69 79 72  BILITOT 0.6 0.5 0.70  PROT 6.2* 6.4* 7.1  ALBUMIN 2.1* 2.0* 2.5*    Recent Labs  10/30/16 0903  AMMONIA 19   CBC:  Recent Labs  12/29/16 0231 12/30/16 0350 12/31/16 0322  01/02/17 0346 01/03/17 0404 02/23/17 1313  WBC 21.9* 20.9* 23.8*  < > 20.0* 21.7* 16.5*  NEUTROABS 10.3* 11.3* 14.0*  --   --   --   --   HGB 13.0 11.9* 12.2*  < >  13.8 14.6 13.7  HCT 40.3 36.7* 37.0*  < > 41.8 43.9 41.9  MCV 94.4 92.7 92.0  < > 91.7 92.0 94  PLT 143* 138* 172  < > 192 173 144*  < > = values in this interval not displayed. Lipid Panel:  Recent Labs  09/15/16 0434  HDL 23*   Cardiac Enzymes:  Recent Labs  12/29/16 0231 12/29/16 0814 12/29/16 1448  TROPONINI 0.05* 0.08* 0.06*   CBG:  Recent Labs  01/03/17 0731 01/03/17 1135 01/03/17 1631  GLUCAP 134* 143* 301*    ASSESSMENT/PLAN:  1. Insomnia, unspecified type  -  continue melatonin 3 mg 2 tabs = 6 mg by mouth daily at bedtime when necessary  2. Chronic kidney disease (CKD), stage III (moderate) -  Creatinine 1.3 , will monitor   3. Chronic atrial fibrillation (HCC) - rate controlled; continue metoprolol succinate ER 25 mg 1 tab by mouth daily and Coumadin 5 mg 1 tab by mouth every Tuesday, Thursday and Saturday, 2.5 mg 1 tab by mouth every MWF and Sunday   4. Essential hypertension - well-controlled, continue metoprolol succinate ER 25 mg 1 tab by mouth daily    5. Other emphysema (HCC) - no wheezing, continue Ventolin HFA 90 g inhaler inhale 2 puffs into the lungs every 6 hours when necessary, ipratropium bromide/albuterol sulfate 0.5-3 (2.5) mg/3 mL inhale 1 bile via nebulizer every 6 hours when necessary, Symbicort 160-4.5 g inhaler inhale 2 puffs by mouth twice a day, DuoNeb every 8 hours when necessary and Spiriva 18 g CP HandiHaler inhale 1 capsule via device by mouth once daily   6. Chronic diastolic CHF (congestive heart failure) (HCC) - no SOB; continue torsemide 20 mg 3 tabs = 60 mg by mouth twice a day, metolazone 2.5 mg 1 tab by mouth once a week and metoprolol succinate ER 25 mg 1 tab by mouth daily   7. Idiopathic gout, unspecified chronicity, unspecified site - continue allopurinol 100 mg 1 tab by mouth daily   8. OSA (obstructive sleep apnea) - continue CPAP at at bedtime   9. Slow transit constipation - continue Senexon - S 1 tab by mouth daily at bedtime when necessary, Bsac-Evac 10 mg suppository 1 per rectum daily when necessary and MOM 30 mL daily when necessary   10. Hypokalemia - continue KCl ER 20 meq 2 tabs = 40 meq by mouth twice a day   11. Diabetic polyneuropathy associated with type 2 diabetes mellitus (HCC) - continue Lantus Solostar 100 units/mL inject 16 units subcutaneous every evening, CBG twice a day   12. Hyperlipidemia, unspecified hyperlipidemia type - continue atorvastatin 10 mg 1 tab by mouth  daily   13. Mood disorder (HCC) - continue Depakote DR 125 mg give 2 tabs = 250 mg by mouth twice a day at 12 PM and 6 PM   14. Chronic gout of right hand, unspecified cause - continue allopurinol 100 mg 1 tab by mouth daily and Norco 5-325 mg 1 tab by mouth every 8 hours when necessary   15. Leukocytosis, unspecified type - wbc 17.9, no fever,awaiting result of UA CS, will do chest x-ray    Goals of care:  Long-term care    Yale Golla C. Maynard - NP    [employer] [phone]

## 2017-03-04 ENCOUNTER — Inpatient Hospital Stay (HOSPITAL_COMMUNITY)
Admission: EM | Admit: 2017-03-04 | Discharge: 2017-03-09 | DRG: 872 | Disposition: A | Discharge status: Skilled Nursing Facility | Payer: PPO | Attending: Internal Medicine | Admitting: Internal Medicine

## 2017-03-04 ENCOUNTER — Encounter (HOSPITAL_COMMUNITY): Payer: Self-pay | Admitting: Emergency Medicine

## 2017-03-04 ENCOUNTER — Emergency Department (HOSPITAL_COMMUNITY): Payer: PPO

## 2017-03-04 ENCOUNTER — Inpatient Hospital Stay (HOSPITAL_COMMUNITY): Payer: PPO

## 2017-03-04 DIAGNOSIS — A4102 Sepsis due to Methicillin resistant Staphylococcus aureus: Secondary | ICD-10-CM | POA: Diagnosis not present

## 2017-03-04 DIAGNOSIS — E669 Obesity, unspecified: Secondary | ICD-10-CM | POA: Diagnosis not present

## 2017-03-04 DIAGNOSIS — E1142 Type 2 diabetes mellitus with diabetic polyneuropathy: Secondary | ICD-10-CM | POA: Diagnosis not present

## 2017-03-04 DIAGNOSIS — W06XXXA Fall from bed, initial encounter: Secondary | ICD-10-CM | POA: Diagnosis not present

## 2017-03-04 DIAGNOSIS — R451 Restlessness and agitation: Secondary | ICD-10-CM

## 2017-03-04 DIAGNOSIS — I5032 Chronic diastolic (congestive) heart failure: Secondary | ICD-10-CM | POA: Diagnosis not present

## 2017-03-04 DIAGNOSIS — M25531 Pain in right wrist: Secondary | ICD-10-CM

## 2017-03-04 DIAGNOSIS — S51812A Laceration without foreign body of left forearm, initial encounter: Secondary | ICD-10-CM | POA: Diagnosis not present

## 2017-03-04 DIAGNOSIS — I482 Chronic atrial fibrillation: Secondary | ICD-10-CM | POA: Diagnosis not present

## 2017-03-04 DIAGNOSIS — N390 Urinary tract infection, site not specified: Secondary | ICD-10-CM | POA: Diagnosis present

## 2017-03-04 DIAGNOSIS — Z7189 Other specified counseling: Secondary | ICD-10-CM | POA: Diagnosis not present

## 2017-03-04 DIAGNOSIS — Z515 Encounter for palliative care: Secondary | ICD-10-CM | POA: Diagnosis present

## 2017-03-04 DIAGNOSIS — B9689 Other specified bacterial agents as the cause of diseases classified elsewhere: Secondary | ICD-10-CM | POA: Diagnosis not present

## 2017-03-04 DIAGNOSIS — N3000 Acute cystitis without hematuria: Secondary | ICD-10-CM | POA: Diagnosis not present

## 2017-03-04 DIAGNOSIS — A419 Sepsis, unspecified organism: Secondary | ICD-10-CM | POA: Diagnosis not present

## 2017-03-04 DIAGNOSIS — Z888 Allergy status to other drugs, medicaments and biological substances status: Secondary | ICD-10-CM | POA: Diagnosis not present

## 2017-03-04 DIAGNOSIS — Z9981 Dependence on supplemental oxygen: Secondary | ICD-10-CM | POA: Diagnosis not present

## 2017-03-04 DIAGNOSIS — I4891 Unspecified atrial fibrillation: Secondary | ICD-10-CM | POA: Diagnosis not present

## 2017-03-04 DIAGNOSIS — Z66 Do not resuscitate: Secondary | ICD-10-CM | POA: Diagnosis present

## 2017-03-04 DIAGNOSIS — Z7901 Long term (current) use of anticoagulants: Secondary | ICD-10-CM

## 2017-03-04 DIAGNOSIS — G4733 Obstructive sleep apnea (adult) (pediatric): Secondary | ICD-10-CM | POA: Diagnosis not present

## 2017-03-04 DIAGNOSIS — I272 Pulmonary hypertension, unspecified: Secondary | ICD-10-CM | POA: Diagnosis present

## 2017-03-04 DIAGNOSIS — T148XXA Other injury of unspecified body region, initial encounter: Secondary | ICD-10-CM | POA: Diagnosis not present

## 2017-03-04 DIAGNOSIS — R4182 Altered mental status, unspecified: Secondary | ICD-10-CM | POA: Diagnosis not present

## 2017-03-04 DIAGNOSIS — N183 Chronic kidney disease, stage 3 (moderate): Secondary | ICD-10-CM | POA: Diagnosis present

## 2017-03-04 DIAGNOSIS — J80 Acute respiratory distress syndrome: Secondary | ICD-10-CM | POA: Diagnosis not present

## 2017-03-04 DIAGNOSIS — E1122 Type 2 diabetes mellitus with diabetic chronic kidney disease: Secondary | ICD-10-CM | POA: Diagnosis not present

## 2017-03-04 DIAGNOSIS — R6520 Severe sepsis without septic shock: Secondary | ICD-10-CM | POA: Diagnosis not present

## 2017-03-04 DIAGNOSIS — I130 Hypertensive heart and chronic kidney disease with heart failure and stage 1 through stage 4 chronic kidney disease, or unspecified chronic kidney disease: Secondary | ICD-10-CM | POA: Diagnosis present

## 2017-03-04 DIAGNOSIS — R0602 Shortness of breath: Secondary | ICD-10-CM

## 2017-03-04 DIAGNOSIS — R488 Other symbolic dysfunctions: Secondary | ICD-10-CM | POA: Diagnosis not present

## 2017-03-04 DIAGNOSIS — E662 Morbid (severe) obesity with alveolar hypoventilation: Secondary | ICD-10-CM | POA: Diagnosis present

## 2017-03-04 DIAGNOSIS — E876 Hypokalemia: Secondary | ICD-10-CM | POA: Diagnosis present

## 2017-03-04 DIAGNOSIS — R52 Pain, unspecified: Secondary | ICD-10-CM

## 2017-03-04 DIAGNOSIS — S59902A Unspecified injury of left elbow, initial encounter: Secondary | ICD-10-CM | POA: Diagnosis not present

## 2017-03-04 DIAGNOSIS — Z6833 Body mass index (BMI) 33.0-33.9, adult: Secondary | ICD-10-CM

## 2017-03-04 DIAGNOSIS — S299XXA Unspecified injury of thorax, initial encounter: Secondary | ICD-10-CM | POA: Diagnosis not present

## 2017-03-04 DIAGNOSIS — R1312 Dysphagia, oropharyngeal phase: Secondary | ICD-10-CM | POA: Diagnosis not present

## 2017-03-04 DIAGNOSIS — C9510 Chronic leukemia of unspecified cell type not having achieved remission: Secondary | ICD-10-CM | POA: Diagnosis not present

## 2017-03-04 DIAGNOSIS — M109 Gout, unspecified: Secondary | ICD-10-CM | POA: Diagnosis present

## 2017-03-04 DIAGNOSIS — M6281 Muscle weakness (generalized): Secondary | ICD-10-CM | POA: Diagnosis not present

## 2017-03-04 DIAGNOSIS — J9610 Chronic respiratory failure, unspecified whether with hypoxia or hypercapnia: Secondary | ICD-10-CM | POA: Diagnosis present

## 2017-03-04 DIAGNOSIS — C9110 Chronic lymphocytic leukemia of B-cell type not having achieved remission: Secondary | ICD-10-CM | POA: Diagnosis present

## 2017-03-04 DIAGNOSIS — J9611 Chronic respiratory failure with hypoxia: Secondary | ICD-10-CM | POA: Diagnosis present

## 2017-03-04 DIAGNOSIS — C9190 Lymphoid leukemia, unspecified not having achieved remission: Secondary | ICD-10-CM | POA: Diagnosis not present

## 2017-03-04 DIAGNOSIS — J449 Chronic obstructive pulmonary disease, unspecified: Secondary | ICD-10-CM | POA: Diagnosis not present

## 2017-03-04 LAB — CBC WITH DIFFERENTIAL/PLATELET
BASOS PCT: 0 %
Basophils Absolute: 0 10*3/uL (ref 0.0–0.1)
EOS PCT: 1 %
Eosinophils Absolute: 0.2 10*3/uL (ref 0.0–0.7)
HEMATOCRIT: 42.7 % (ref 39.0–52.0)
Hemoglobin: 14.2 g/dL (ref 13.0–17.0)
LYMPHS ABS: 7.7 10*3/uL — AB (ref 0.7–4.0)
Lymphocytes Relative: 37 %
MCH: 31 pg (ref 26.0–34.0)
MCHC: 33.3 g/dL (ref 30.0–36.0)
MCV: 93.2 fL (ref 78.0–100.0)
MONO ABS: 1.7 10*3/uL — AB (ref 0.1–1.0)
MONOS PCT: 8 %
NEUTROS ABS: 11.1 10*3/uL — AB (ref 1.7–7.7)
Neutrophils Relative %: 54 %
Platelets: 211 10*3/uL (ref 150–400)
RBC: 4.58 MIL/uL (ref 4.22–5.81)
RDW: 18.3 % — AB (ref 11.5–15.5)
WBC: 20.7 10*3/uL — ABNORMAL HIGH (ref 4.0–10.5)

## 2017-03-04 LAB — URINALYSIS, ROUTINE W REFLEX MICROSCOPIC
Bilirubin Urine: NEGATIVE
Glucose, UA: NEGATIVE mg/dL
KETONES UR: NEGATIVE mg/dL
Nitrite: NEGATIVE
PH: 5 (ref 5.0–8.0)
Protein, ur: NEGATIVE mg/dL
SQUAMOUS EPITHELIAL / LPF: NONE SEEN
Specific Gravity, Urine: 1.008 (ref 1.005–1.030)

## 2017-03-04 LAB — GLUCOSE, CAPILLARY
GLUCOSE-CAPILLARY: 356 mg/dL — AB (ref 65–99)
Glucose-Capillary: 119 mg/dL — ABNORMAL HIGH (ref 65–99)
Glucose-Capillary: 100 mg/dL — ABNORMAL HIGH (ref 65–99)
Glucose-Capillary: 206 mg/dL — ABNORMAL HIGH (ref 65–99)

## 2017-03-04 LAB — COMPREHENSIVE METABOLIC PANEL
ALBUMIN: 2.7 g/dL — AB (ref 3.5–5.0)
ALT: 10 U/L — ABNORMAL LOW (ref 17–63)
AST: 22 U/L (ref 15–41)
Alkaline Phosphatase: 71 U/L (ref 38–126)
Anion gap: 14 (ref 5–15)
BUN: 46 mg/dL — AB (ref 6–20)
CHLORIDE: 94 mmol/L — AB (ref 101–111)
CO2: 32 mmol/L (ref 22–32)
Calcium: 9.1 mg/dL (ref 8.9–10.3)
Creatinine, Ser: 1.79 mg/dL — ABNORMAL HIGH (ref 0.61–1.24)
GFR calc Af Amer: 39 mL/min — ABNORMAL LOW (ref >60)
GFR calc non Af Amer: 34 mL/min — ABNORMAL LOW (ref >60)
GLUCOSE: 170 mg/dL — AB (ref 65–99)
POTASSIUM: 4.6 mmol/L (ref 3.5–5.1)
Sodium: 140 mmol/L (ref 135–145)
Total Bilirubin: 0.8 mg/dL (ref 0.3–1.2)
Total Protein: 7.7 g/dL (ref 6.5–8.1)

## 2017-03-04 LAB — TROPONIN I
TROPONIN I: 0.04 ng/mL — AB (ref <0.03)
TROPONIN I: 0.03 ng/mL — AB (ref <0.03)
Troponin I: 0.03 ng/mL (ref <0.03)

## 2017-03-04 LAB — VALPROIC ACID LEVEL: VALPROIC ACID LVL: 28 ug/mL — AB (ref 50.0–100.0)

## 2017-03-04 LAB — PROTIME-INR
INR: 3.12
PROTHROMBIN TIME: 32.8 s — AB (ref 11.4–15.2)

## 2017-03-04 LAB — MRSA PCR SCREENING: MRSA by PCR: NEGATIVE

## 2017-03-04 MED ORDER — SODIUM CHLORIDE 0.9% FLUSH: 3.0000 mL | INTRAVENOUS | Status: DC | PRN

## 2017-03-04 MED ORDER — MOMETASONE FURO-FORMOTEROL FUM 200-5 MCG/ACT IN AERO
2.0000 | INHALATION_SPRAY | Freq: Two times a day (BID) | RESPIRATORY_TRACT | Status: DC
  Administered 2017-03-04 – 2017-03-08 (×9): 2 via RESPIRATORY_TRACT
  Filled 2017-03-04: qty 8.8

## 2017-03-04 MED ORDER — TORSEMIDE 20 MG PO TABS
60.0000 mg | ORAL_TABLET | Freq: Two times a day (BID) | ORAL | Status: DC
  Administered 2017-03-05: 60 mg via ORAL
  Filled 2017-03-04: qty 3

## 2017-03-04 MED ORDER — SENNOSIDES-DOCUSATE SODIUM 8.6-50 MG PO TABS: 1.0000 | ORAL_TABLET | Freq: Every evening | ORAL | Status: DC | PRN

## 2017-03-04 MED ORDER — METOPROLOL SUCCINATE ER 25 MG PO TB24
25.0000 mg | ORAL_TABLET | Freq: Every day | ORAL | Status: DC
  Administered 2017-03-04: 25 mg via ORAL
  Filled 2017-03-04: qty 1

## 2017-03-04 MED ORDER — IOPAMIDOL (ISOVUE-370) INJECTION 76%
80.0000 mL | Freq: Once | INTRAVENOUS | Status: AC | PRN
  Administered 2017-03-04: 80 mL via INTRAVENOUS

## 2017-03-04 MED ORDER — MAGNESIUM HYDROXIDE 400 MG/5ML PO SUSP: 30.0000 mL | Freq: Every day | ORAL | Status: DC | PRN

## 2017-03-04 MED ORDER — POLYETHYLENE GLYCOL 3350 17 G PO PACK
17.0000 g | PACK | Freq: Every day | ORAL | Status: DC
  Administered 2017-03-04 – 2017-03-09 (×6): 17 g via ORAL
  Filled 2017-03-04 (×5): qty 1

## 2017-03-04 MED ORDER — ONDANSETRON HCL 4 MG/2ML IJ SOLN
4.0000 mg | Freq: Four times a day (QID) | INTRAMUSCULAR | Status: DC | PRN
  Filled 2017-03-04: qty 2

## 2017-03-04 MED ORDER — INSULIN ASPART 100 UNIT/ML ~~LOC~~ SOLN
0.0000 [IU] | Freq: Every day | SUBCUTANEOUS | Status: DC
  Administered 2017-03-04: 5 [IU] via SUBCUTANEOUS

## 2017-03-04 MED ORDER — DEXTROSE 5 % IV SOLN
1.0000 g | INTRAVENOUS | Status: DC
  Administered 2017-03-05 – 2017-03-06 (×2): 1 g via INTRAVENOUS
  Filled 2017-03-04 (×2): qty 10

## 2017-03-04 MED ORDER — PREDNISONE 20 MG PO TABS
40.0000 mg | ORAL_TABLET | Freq: Two times a day (BID) | ORAL | Status: DC
  Administered 2017-03-04 – 2017-03-05 (×3): 40 mg via ORAL
  Filled 2017-03-04 (×3): qty 2

## 2017-03-04 MED ORDER — ONDANSETRON HCL 4 MG PO TABS: 4.0000 mg | ORAL_TABLET | Freq: Four times a day (QID) | ORAL | Status: DC | PRN

## 2017-03-04 MED ORDER — ACETAMINOPHEN 650 MG RE SUPP
650.0000 mg | Freq: Four times a day (QID) | RECTAL | Status: DC | PRN
Start: 2017-03-04 — End: 2017-03-09

## 2017-03-04 MED ORDER — ALLOPURINOL 100 MG PO TABS
100.0000 mg | ORAL_TABLET | Freq: Every day | ORAL | Status: DC
  Administered 2017-03-04 – 2017-03-09 (×6): 100 mg via ORAL
  Filled 2017-03-04 (×6): qty 1

## 2017-03-04 MED ORDER — DEXTROSE 5 % IV SOLN
1.0000 g | Freq: Once | INTRAVENOUS | Status: AC
  Administered 2017-03-04: 1 g via INTRAVENOUS
  Filled 2017-03-04: qty 10

## 2017-03-04 MED ORDER — DIVALPROEX SODIUM 250 MG PO DR TAB
250.0000 mg | DELAYED_RELEASE_TABLET | Freq: Two times a day (BID) | ORAL | Status: DC
  Administered 2017-03-04 – 2017-03-09 (×10): 250 mg via ORAL
  Filled 2017-03-04 (×11): qty 1

## 2017-03-04 MED ORDER — SODIUM CHLORIDE 0.9% FLUSH
3.0000 mL | Freq: Two times a day (BID) | INTRAVENOUS | Status: DC
Start: 2017-03-04 — End: 2017-03-09
  Administered 2017-03-04 – 2017-03-08 (×9): 3 mL via INTRAVENOUS

## 2017-03-04 MED ORDER — ALBUTEROL SULFATE (2.5 MG/3ML) 0.083% IN NEBU
5.0000 mg | INHALATION_SOLUTION | Freq: Once | RESPIRATORY_TRACT | Status: AC
  Administered 2017-03-04: 5 mg via RESPIRATORY_TRACT
  Filled 2017-03-04: qty 6

## 2017-03-04 MED ORDER — NYSTATIN 100000 UNIT/GM EX CREA
1.0000 "application " | TOPICAL_CREAM | Freq: Two times a day (BID) | CUTANEOUS | Status: DC
  Administered 2017-03-04 – 2017-03-09 (×10): 1 via TOPICAL
  Filled 2017-03-04: qty 15

## 2017-03-04 MED ORDER — SODIUM CHLORIDE 0.9 % IV BOLUS (SEPSIS)
500.0000 mL | Freq: Once | INTRAVENOUS | Status: AC
  Administered 2017-03-04: 500 mL via INTRAVENOUS

## 2017-03-04 MED ORDER — SODIUM CHLORIDE 0.9 % IV BOLUS (SEPSIS)
1000.0000 mL | Freq: Once | INTRAVENOUS | Status: AC
  Administered 2017-03-04: 1000 mL via INTRAVENOUS

## 2017-03-04 MED ORDER — IPRATROPIUM BROMIDE 0.02 % IN SOLN
0.5000 mg | Freq: Once | RESPIRATORY_TRACT | Status: AC
  Administered 2017-03-04: 0.5 mg via RESPIRATORY_TRACT
  Filled 2017-03-04: qty 2.5

## 2017-03-04 MED ORDER — SODIUM CHLORIDE 0.9% FLUSH
3.0000 mL | Freq: Two times a day (BID) | INTRAVENOUS | Status: DC
  Administered 2017-03-04 – 2017-03-08 (×4): 3 mL via INTRAVENOUS

## 2017-03-04 MED ORDER — HYDROCODONE-ACETAMINOPHEN 5-325 MG PO TABS
1.0000 | ORAL_TABLET | Freq: Three times a day (TID) | ORAL | Status: DC | PRN
  Administered 2017-03-04 – 2017-03-09 (×4): 1 via ORAL
  Filled 2017-03-04 (×5): qty 1

## 2017-03-04 MED ORDER — ATORVASTATIN CALCIUM 10 MG PO TABS
10.0000 mg | ORAL_TABLET | Freq: Every day | ORAL | Status: DC
  Administered 2017-03-04 – 2017-03-08 (×5): 10 mg via ORAL
  Filled 2017-03-04 (×5): qty 1

## 2017-03-04 MED ORDER — INSULIN ASPART 100 UNIT/ML ~~LOC~~ SOLN
0.0000 [IU] | Freq: Three times a day (TID) | SUBCUTANEOUS | Status: DC
  Administered 2017-03-04: 3 [IU] via SUBCUTANEOUS

## 2017-03-04 MED ORDER — TIOTROPIUM BROMIDE MONOHYDRATE 18 MCG IN CAPS
18.0000 ug | ORAL_CAPSULE | Freq: Every day | RESPIRATORY_TRACT | Status: DC
  Administered 2017-03-04 – 2017-03-09 (×6): 18 ug via RESPIRATORY_TRACT
  Filled 2017-03-04 (×2): qty 5

## 2017-03-04 MED ORDER — INSULIN ASPART 100 UNIT/ML ~~LOC~~ SOLN
0.0000 [IU] | Freq: Three times a day (TID) | SUBCUTANEOUS | Status: DC
  Administered 2017-03-05: 5 [IU] via SUBCUTANEOUS

## 2017-03-04 MED ORDER — SODIUM CHLORIDE 0.9 % IV SOLN: 250.0000 mL | INTRAVENOUS | Status: DC | PRN

## 2017-03-04 MED ORDER — IOPAMIDOL (ISOVUE-370) INJECTION 76%
INTRAVENOUS | Status: AC
  Filled 2017-03-04: qty 100

## 2017-03-04 MED ORDER — IPRATROPIUM-ALBUTEROL 0.5-2.5 (3) MG/3ML IN SOLN
3.0000 mL | Freq: Four times a day (QID) | RESPIRATORY_TRACT | Status: DC | PRN
  Administered 2017-03-09: 3 mL via RESPIRATORY_TRACT
  Filled 2017-03-04: qty 3

## 2017-03-04 MED ORDER — ACETAMINOPHEN 325 MG PO TABS: 650.0000 mg | ORAL_TABLET | Freq: Four times a day (QID) | ORAL | Status: DC | PRN

## 2017-03-04 MED ORDER — INSULIN GLARGINE 100 UNIT/ML ~~LOC~~ SOLN
10.0000 [IU] | Freq: Every day | SUBCUTANEOUS | Status: DC
  Administered 2017-03-04 – 2017-03-08 (×5): 10 [IU] via SUBCUTANEOUS
  Filled 2017-03-04 (×7): qty 0.1

## 2017-03-04 MED ORDER — WARFARIN - PHARMACIST DOSING INPATIENT: Freq: Every day | Status: DC

## 2017-03-04 NOTE — ED Notes (Signed)
Phone report given to ICU RN

## 2017-03-04 NOTE — Progress Notes (Signed)
ANTICOAGULATION CONSULT NOTE - Initial Consult  Pharmacy Consult for warfarin Indication: atrial fibrillation  Allergies  Allergen Reactions  . Quinine Palpitations and Other (See Comments)    Seizure   . Uloric [Febuxostat] Rash    Patient Measurements:   Heparin Dosing Weight:   Vital Signs: Temp: 97.9 F (36.6 C) (06/16 0203) Temp Source: Oral (06/16 0203) BP: 106/50 (06/16 0845) Pulse Rate: 93 (06/16 0850)  Labs:  Recent Labs  03/04/17 0250  HGB 14.2  HCT 42.7  PLT 211  LABPROT 32.8*  INR 3.12  CREATININE 1.79*  TROPONINI 0.03*    Estimated Creatinine Clearance: 42.8 mL/min (A) (by C-G formula based on SCr of 1.79 mg/dL (H)).   Medical History: Past Medical History:  Diagnosis Date  . Atrial fibrillation ([place])   . Cerebrovascular disease   . CHF (congestive heart failure) ([place])   . CLL (chronic lymphocytic leukemia) ([place]) 11/17/2014  . COPD (chronic obstructive pulmonary disease) ([place])   . History of thrombocytopenia   . Obesity   . Osteoarthritis   . Other and unspecified hyperlipidemia   . Type II or unspecified type diabetes mellitus without mention of complication, not stated as uncontrolled   . Unspecified essential hypertension     Medications:  Scheduled:  . allopurinol  100 mg Oral Daily  . atorvastatin  10 mg Oral q1800  . divalproex  250 mg Oral BID  . insulin aspart  0-9 Units Subcutaneous TID WC  . insulin glargine  10 Units Subcutaneous QHS  . iopamidol      . metoprolol succinate  25 mg Oral Daily  . mometasone-formoterol  2 puff Inhalation BID  . nystatin cream  1 application Topical BID  . polyethylene glycol  17 g Oral Daily  . predniSONE  40 mg Oral BID WC  . sodium chloride flush  3 mL Intravenous Q12H  . sodium chloride flush  3 mL Intravenous Q12H  . tiotropium  18 mcg Inhalation Daily  . [START ON 03/05/2017] torsemide  60 mg Oral BID    Assessment: Pharmacy is consulted to dose warfarin in 83 yo male with past  history of atrial fibrillation.   Pt was admitted after a fall at home. Per note, there is a skin tear on the left forearm. No head injury. No focal deficits noted. Pt also admitted with sepsis due to UTI.    Pt's home regimen is warfarin 5 mg on TuThurSat and 2.5 mg PO on MWFSun.  Today, 03/04/17   INR 3.12  PT 32.8  Hgb 14.2, Plt 211  DDI: starting ceftriaxone, low likelihood of major interaction  Thin liquid diet ordered    Goal of Therapy:  INR 2-3 Monitor platelets by anticoagulation protocol: Yes   Plan:   Hold warfarin today as INR is supratherapeutic  Daily INR  Monitor for signs and symptoms of bleeding  Royetta Asal, PharmD, BCPS Pager [phone] 03/04/2017 11:11 AM

## 2017-03-04 NOTE — ED Provider Notes (Signed)
[facility] DEPT Provider Note   CSN: [id number] Arrival date & time: 03/04/17  0142     History   Chief Complaint Chief Complaint  Patient presents with  . Fall    HPI William Braun is a 83 y.o. male.  Patient with a history of Atrial fibrillation, CVA, CHF, CLL, COPD, DM, HTN, HLD presents from [facility] after he fell out of bed. He denies pain from the fall. He does not feel he lost consciousness. No nausea or vomiting. He reports he was not trying to get up from bed but that he simply rolled over and fell to the floor. On arrival here, he complains of significant SOB stating "I can't breathe". He states he is on home O2 and is currently on O2 here at 4L. No chest pain. He reports he has had a cough recently. No known fever.     The history is provided by the patient and the EMS personnel. No language interpreter was used.    Past Medical History:  Diagnosis Date  . Atrial fibrillation ([place])   . Cerebrovascular disease   . CHF (congestive heart failure) ([place])   . CLL (chronic lymphocytic leukemia) ([place]) 11/17/2014  . COPD (chronic obstructive pulmonary disease) ([place])   . History of thrombocytopenia   . Obesity   . Osteoarthritis   . Other and unspecified hyperlipidemia   . Type II or unspecified type diabetes mellitus without mention of complication, not stated as uncontrolled   . Unspecified essential hypertension     Patient Active Problem List   Diagnosis Date Noted  . Gout 01/05/2017  . Sepsis ([place]) 12/29/2016  . Acute respiratory failure ([place]) 12/29/2016  . Malnutrition of moderate degree 12/29/2016  . Pain and swelling of right wrist 12/28/2016  . Chronic kidney disease (CKD), stage III (moderate) 11/17/2016  . Diabetic neuropathy ([place]) 11/17/2016  . Diabetic kidney ([place]) 11/17/2016  . Diabetes mellitus with polyneuropathy ([place]) 11/17/2016  . Acute on chronic diastolic CHF (congestive heart failure) ([place]) 10/31/2016  . Pressure injury of skin  10/30/2016  . Complicated UTI (urinary tract infection) 10/29/2016  . Acute on chronic respiratory failure with hypoxia ([place]) 09/14/2016  . Elevated troponin 09/14/2016  . Hypokalemia 09/14/2016  . Acute renal failure superimposed on stage 3 chronic kidney disease ([place]) 09/14/2016  . Toe ulcer, right ([place]) 02/02/2016  . CLL (chronic lymphocytic leukemia) ([place]) 11/17/2014  . Elevated WBCs 04/23/2014  . Other emphysema ([place]) 09/27/2013  . Dyspnea 08/13/2013  . 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 Chest pain, unspecified 08/03/2011  . Chronic diastolic CHF (congestive heart failure) ([place]) 05/11/2011  . Obesity hypoventilation syndrome ([place]) 03/12/2010  . Combined systolic and diastolic heart failure ([place]) 03/12/2010  . COPD exacerbation ([place]) 03/12/2010  . Chronic respiratory failure ([place]) 02/19/2010  . HLD (hyperlipidemia) 12/03/2008  . Morbid obesity ([place]) 12/03/2008  . Essential hypertension 12/03/2008  . ATRIAL FIBRILLATION 12/03/2008  . PERIPHERAL EDEMA 12/03/2008    Past Surgical History:  Procedure Laterality Date  . KNEE ARTHROSCOPY W/ ALLOGRAFT IMPANT    . VASECTOMY         Home Medications    Prior to Admission medications   Medication Sig Start Date End Date Taking? Authorizing Provider  albuterol (VENTOLIN HFA) 108 (90 BASE) MCG/ACT inhaler Inhale 2 puffs into the lungs every 6 (six) hours as needed for wheezing or shortness of breath. 07/23/15   Chesley Mires, MD  allopurinol (ZYLOPRIM) 100 MG tablet Take 1 tablet (100 mg total) by  mouth daily. 01/18/17   Lauree Chandler, NP  aluminum-magnesium hydroxide-simethicone (MAALOX) 761-950-93 MG/5ML SUSP Take 30 mLs by mouth every 4 (four) hours as needed (INDIGESTION OR HEARTBURN).    [provider]  Amino Acids-Protein Hydrolys (FEEDING SUPPLEMENT, PRO-STAT SUGAR FREE 64,) LIQD Take 30 mLs by mouth daily.    [provider]  antiseptic oral rinse (BIOTENE) LIQD 15 mLs by Mouth Rinse route every 6 (six)  hours as needed for dry mouth.    [provider]  atorvastatin (LIPITOR) 10 MG tablet Take 1 tablet (10 mg total) by mouth daily at 6 PM. 11/07/16   Lavina Hamman, MD  bisacodyl (DULCOLAX) 10 MG suppository Place 10 mg rectally daily as needed for moderate constipation.    [provider]  budesonide-formoterol (SYMBICORT) 160-4.5 MCG/ACT inhaler Inhale 2 puffs into the lungs 2 (two) times daily. 2 puffs twice daily 11/13/15   Chesley Mires, MD  divalproex (DEPAKOTE) 250 MG DR tablet Take 250 mg by mouth 2 (two) times daily.    [provider]  HYDROcodone-acetaminophen (NORCO/VICODIN) 5-325 MG tablet Take 1 tablet by mouth every 8 (eight) hours as needed for moderate pain.    [provider]  Insulin Glargine (LANTUS SOLOSTAR) 100 UNIT/ML Solostar Pen Inject 16 Units into the skin daily at 10 pm.    [provider]  ipratropium-albuterol (DUONEB) 0.5-2.5 (3) MG/3ML SOLN Take 3 mLs by nebulization every 6 (six) hours as needed (COPD).    [provider]  magnesium hydroxide (MILK OF MAGNESIA) 400 MG/5ML suspension Take 30 mLs by mouth every 4 (four) hours as needed for heartburn or indigestion.     [provider]  Melatonin 3 MG TABS Take 6 mg by mouth at bedtime as needed (sleep).     [provider]  metolazone (ZAROXOLYN) 2.5 MG tablet Take 1 tablet (2.5 mg total) by mouth once a week. 01/03/17 04/03/17  Lavina Hamman, MD  metoprolol succinate (TOPROL-XL) 25 MG 24 hr tablet Take 25 mg by mouth daily.     [provider]  nitroGLYCERIN (NITROSTAT) 0.4 MG SL tablet Place 1 tablet (0.4 mg total) under the tongue every 5 (five) minutes as needed for chest pain. 08/03/11   Richardson Dopp T, PA-C  nystatin cream (MYCOSTATIN) Apply 1 application topically 2 (two) times daily. Apply to gluteal fold/buttocks    [provider]  polyethylene glycol (MIRALAX / GLYCOLAX) packet Take 17 g by mouth daily. 11/07/16   Lavina Hamman, MD  potassium chloride SA (K-DUR,KLOR-CON) 20 MEQ tablet Take 2 tablets (40 mEq total) by mouth 2 (two) times daily. 12/26/16   Arbutus Leas, NP  senna-docusate (SENOKOT-S) 8.6-50 MG tablet Take 1 tablet by mouth at bedtime as needed for mild constipation. 11/07/16   Lavina Hamman, MD  Sodium Phosphates (RA SALINE ENEMA RE) Place 1 Applicatorful rectally daily as needed (constipation).    [provider]  tiotropium (SPIRIVA) 18 MCG inhalation capsule Place 1 capsule (18 mcg total) into inhaler and inhale daily. 11/13/15   Chesley Mires, MD  torsemide (DEMADEX) 20 MG tablet Take 3 tablets (60 mg total) by mouth 2 (two) times daily. 12/26/16   Arbutus Leas, NP  warfarin (COUMADIN) 2.5 MG tablet Take 2.5 mg by mouth. M-W-F-Sun    [provider]  warfarin (COUMADIN) 5 MG tablet Take 5 mg by mouth. Take Tue, Thu, Sat    [provider]    Family History Family History  Problem Relation Age of Onset  . Heart disease Father     Social History Social History  Substance Use Topics  . Smoking status: Former Smoker    Packs/day: 2.00    Years: 34.00    Types: Cigarettes    Start date: 04/[birth year]/1946    Quit date: 09/19/1978  . Smokeless tobacco: Never Used     Comment: quit 35 years ago  . Alcohol use No     Allergies   Quinine and Uloric [febuxostat]   Review of Systems Review of Systems  Constitutional: Negative for chills and fever.  HENT: Negative.   Respiratory: Positive for cough and shortness of breath.   Cardiovascular: Negative.  Negative for chest pain.  Gastrointestinal: Negative.  Negative for abdominal pain and vomiting.  Genitourinary: Negative.  Negative for dysuria.  Musculoskeletal: Negative.  Negative for myalgias.  Skin: Negative.   Neurological: Negative.  Negative for headaches.     Physical Exam Updated Vital Signs BP 105/72   Pulse 86   Temp 97.9 F (36.6 C) (Oral)   Resp 20   SpO2 94%   Physical Exam  Constitutional:  He appears well-developed and well-nourished.  HENT:  Head: Normocephalic.  Neck: Normal range of motion. Neck supple.  Cardiovascular: An irregularly irregular rhythm present. Tachycardia present.   Pulmonary/Chest: He has no wheezes. He has rales. He exhibits no tenderness.  He is in mild respiratory distress, tachypnea, accessory muscle use, speaking in short sentences.   Abdominal: Soft. Bowel sounds are normal. There is no tenderness. There is no rebound and no guarding.  Musculoskeletal: Normal range of motion.  Neurological: He is alert.  Skin: Skin is warm and dry. No rash noted.  Psychiatric: He has a normal mood and affect.     ED Treatments / Results  Labs (all labs ordered are listed, but only abnormal results are displayed) Labs Reviewed  URINE CULTURE  CULTURE, BLOOD (ROUTINE X 2)  CULTURE, BLOOD (ROUTINE X 2)  COMPREHENSIVE METABOLIC PANEL  CBC WITH DIFFERENTIAL/PLATELET  URINALYSIS, ROUTINE W REFLEX MICROSCOPIC  TROPONIN I    EKG  EKG Interpretation None       Radiology No results found.  Procedures Procedures (including critical care time)  Medications Ordered in ED Medications  sodium chloride 0.9 % bolus 500 mL (not administered)  albuterol (PROVENTIL) (2.5 MG/3ML) 0.083% nebulizer solution 5 mg (not administered)  ipratropium (ATROVENT) nebulizer solution 0.5 mg (not administered)     Initial Impression / Assessment and Plan / ED Course  I have reviewed the triage vital signs and the nursing notes.  Pertinent labs & imaging results that were available during my care of the patient were reviewed by me and considered in my medical decision making (see chart for details).     Patient to ED for evaluation after roll-out-of-bed fall with skin tears to left arm. No other apparent injury.   Here he complains of significant shortness of breath. He is found to be hypoxic to mid-80's despite 4L via nasal cannula. After arrival he admits to chest  discomfort. He is hypotensive, tachycardic, tachypneic - concern for sepsis. Labs, CXR pending. EKG shows A-fib with RVR with rate of 118.  Albuterol/atrovent nebulizer ordered for comfort and hypoxia. History of O2 dependent COPD, CHF. No extremity edema currently.   Nebulizer tx does not provide any relief. CXR does not show acute process. CTA PE study ordered secondary to persistent c/o SOB and tachycardia, and is found to be normal. No PE.  The patient now complains of pain in the right wrist, whereas before he stated he did not have pain. Elements of history change over time. Question baseline dementia vs acute AMS.   The patient continues to be agitated. He calls out frequently with "please help me". He remains tachycardic. He has a leukocytosis of 22K which is consistent with baseline, presumed due to history of CLL.   He is found to have a urinary tract infection. Cultures pending. Rocephin started. Will admit to [facility]  Final Clinical Impressions(s) / ED Diagnoses   Final diagnoses:  None   1. UTI 2. Agitation 3. SOB  New Prescriptions New Prescriptions   No medications on file     Charlann Lange, Hershal Coria 03/04/17 [place], [place], DO 03/04/17 [phone]

## 2017-03-04 NOTE — ED Notes (Signed)
Bed: XN17 Expected date:  Expected time:  Means of arrival:  Comments: EMS 83 yo male fall/blood thinner-skin tear left arm

## 2017-03-04 NOTE — Progress Notes (Signed)
CRITICAL VALUE ALERT  Critical Value:  Trop .04   Date & Time Notied:  Dr. Maryland Pink 03/04/17 130  Provider Notified: Dr. Maryland Pink  Orders Received/Actions taken: Text paged

## 2017-03-04 NOTE — ED Notes (Signed)
Spoke to patients wife and told her which room pt was being admitted to.

## 2017-03-04 NOTE — H&P (Signed)
[facility] History and Physical  [name]:[id number] DOB: Feb 09, 1933 DOA: 03/04/2017   PCP: Hendricks Limes, MD  Specialists: Dr. Aundra Dubin is his cardiologist. [name] is his pulmonologist  Chief Complaint: Fall out of the bed  HPI: William Braun is a 83 y.o. male with a past medical history of atrial fibrillation on anticoagulation with warfarin, history of COPD, history of OSA/OHS on BiPAP at night and O2 at daytime, chronic kidney disease stage III, history of gout on allopurinol, history of diabetes mellitus on insulin, history of chronic diastolic CHF with pulmonary hypertension, who lives in an assisted living facility. Patient apparently had a fall last night. He rolled out of his bed and the injured his left forearm resulting in a skin tear. He denies any head injuries. No headaches. While he was being evaluated in the emergency department, patient developed shortness of breath. A CT angiogram of the chest was done which did not show any PE. No interstitial edema was noted either. Patient denies any chest pain. Patient appears to be confused. His son arrived after my assessment. He tells me that for the past few days he has been getting more more confused. And apparently this occurs every time patient has an infection. Patient has also had a few falls in that home.. In the emergency department evaluation revealed a urinary tract infection, which could be the reason for his confusion. Patient is not a very good historian due to this confusion. So history is limited.  Home Medications: Prior to Admission medications   Medication Sig Start Date End Date Taking? Authorizing Provider  albuterol (VENTOLIN HFA) 108 (90 BASE) MCG/ACT inhaler Inhale 2 puffs into the lungs every 6 (six) hours as needed for wheezing or shortness of breath. 07/23/15  Yes Chesley Mires, MD  allopurinol (ZYLOPRIM) 100 MG tablet Take 1 tablet (100 mg total) by mouth daily. 01/18/17  Yes Lauree Chandler, NP    aluminum-magnesium hydroxide-simethicone (MAALOX) [phone] MG/5ML SUSP Take 30 mLs by mouth every 4 (four) hours as needed (INDIGESTION OR HEARTBURN).   Yes [provider]  Amino Acids-Protein Hydrolys (FEEDING SUPPLEMENT, PRO-STAT SUGAR FREE 64,) LIQD Take 30 mLs by mouth daily.   Yes [provider]  antiseptic oral rinse (BIOTENE) LIQD 15 mLs by Mouth Rinse route every 6 (six) hours as needed for dry mouth.   Yes [provider]  atorvastatin (LIPITOR) 10 MG tablet Take 1 tablet (10 mg total) by mouth daily at 6 PM. 11/07/16  Yes Lavina Hamman, MD  bisacodyl (DULCOLAX) 10 MG suppository Place 10 mg rectally daily as needed for moderate constipation.   Yes [provider]  budesonide-formoterol (SYMBICORT) 160-4.5 MCG/ACT inhaler Inhale 2 puffs into the lungs 2 (two) times daily. 2 puffs twice daily 11/13/15  Yes Chesley Mires, MD  divalproex (DEPAKOTE) 250 MG DR tablet Take 250 mg by mouth 2 (two) times daily.   Yes [provider]  HYDROcodone-acetaminophen (NORCO/VICODIN) 5-325 MG tablet Take 1 tablet by mouth every 8 (eight) hours as needed for moderate pain.   Yes [provider]  Insulin Glargine (LANTUS SOLOSTAR) 100 UNIT/ML Solostar Pen Inject 16 Units into the skin daily at 10 pm.   Yes [provider]  ipratropium-albuterol (DUONEB) 0.5-2.5 (3) MG/3ML SOLN Take 3 mLs by nebulization every 6 (six) hours as needed (COPD).   Yes [provider]  magnesium hydroxide (MILK OF MAGNESIA) 400 MG/5ML suspension Take 30 mLs by mouth every 4 (four) hours as  needed for heartburn or indigestion.    Yes [provider]  Melatonin 3 MG TABS Take 6 mg by mouth at bedtime as needed (sleep).    Yes [provider]  metolazone (ZAROXOLYN) 2.5 MG tablet Take 1 tablet (2.5 mg total) by mouth once a week. 01/03/17 04/03/17 Yes Lavina Hamman, MD  metoprolol succinate (TOPROL-XL) 25 MG 24 hr tablet Take 25 mg by mouth  daily.    Yes [provider]  nitroGLYCERIN (NITROSTAT) 0.4 MG SL tablet Place 1 tablet (0.4 mg total) under the tongue every 5 (five) minutes as needed for chest pain. 08/03/11  Yes Weaver, Scott T, PA-C  nystatin cream (MYCOSTATIN) Apply 1 application topically 2 (two) times daily. Apply to gluteal fold/buttocks   Yes [provider]  OXYGEN Inhale 3 L into the lungs continuous.   Yes [provider]  polyethylene glycol (MIRALAX / GLYCOLAX) packet Take 17 g by mouth daily. 11/07/16  Yes Lavina Hamman, MD  potassium chloride SA (K-DUR,KLOR-CON) 20 MEQ tablet Take 2 tablets (40 mEq total) by mouth 2 (two) times daily. 12/26/16  Yes Arbutus Leas, NP  senna-docusate (SENOKOT-S) 8.6-50 MG tablet Take 1 tablet by mouth at bedtime as needed for mild constipation. 11/07/16  Yes Lavina Hamman, MD  Sodium Phosphates (RA SALINE ENEMA RE) Place 1 Applicatorful rectally daily as needed (constipation).   Yes [provider]  tiotropium (SPIRIVA) 18 MCG inhalation capsule Place 1 capsule (18 mcg total) into inhaler and inhale daily. 11/13/15  Yes Chesley Mires, MD  torsemide (DEMADEX) 20 MG tablet Take 3 tablets (60 mg total) by mouth 2 (two) times daily. 12/26/16  Yes Arbutus Leas, NP  warfarin (COUMADIN) 2.5 MG tablet Take 2.5 mg by mouth See admin instructions. M-W-F-Sun    Yes [provider]  warfarin (COUMADIN) 5 MG tablet Take 5 mg by mouth. Take Tue, Thu, Sat   Yes [provider]    Allergies:  Allergies  Allergen Reactions  . Quinine Palpitations and Other (See Comments)    Seizure   . Uloric [Febuxostat] Rash    Past Medical History: Past Medical History:  Diagnosis Date  . Atrial fibrillation ([place])   . Cerebrovascular disease   . CHF (congestive heart failure) ([place])   . CLL (chronic lymphocytic leukemia) ([place]) 11/17/2014  . COPD (chronic obstructive pulmonary disease) ([place])   . History of thrombocytopenia   . Obesity   .  Osteoarthritis   . Other and unspecified hyperlipidemia   . Type II or unspecified type diabetes mellitus without mention of complication, not stated as uncontrolled   . Unspecified essential hypertension     Past Surgical History:  Procedure Laterality Date  . KNEE ARTHROSCOPY W/ ALLOGRAFT IMPANT    . VASECTOMY      Social History: Patient lives in an assisted living facility.  Social History   Social History  . Marital status: Married    Spouse name: N/A  . Number of children: N/A  . Years of education: N/A   Occupational History  . Retired    Social History Main Topics  . Smoking status: Former Smoker    Packs/day: 2.00    Years: 34.00    Types: Cigarettes    Start date: 04/23/1946    Quit date: 09/19/1978  . Smokeless tobacco: Never Used     Comment: quit 35 years ago  . Alcohol use No  . Drug use: Unknown  . Sexual activity: Not Currently  Other Topics Concern  . Not on file   Social History Narrative  . No narrative on file     Family History:  Family History  Problem Relation Age of Onset  . Heart disease Father      Review of Systems - Unable to do due to his confusion  Physical Examination  Vitals:   03/04/17 0430 03/04/17 0630 03/04/17 0700 03/04/17 0730  BP: 106/74 100/70 114/80 107/70  Pulse: 93 96 97 93  Resp: (!) 24 (!) 25 (!) 26 (!) 23  Temp:      TempSrc:      SpO2: 95% 95% 93% 98%    BP 107/70   Pulse 93   Temp 97.9 F (36.6 C) (Oral)   Resp (!) 23   SpO2 98%   General appearance: alert, cooperative, appears stated age, distracted and no distress Head: Normocephalic, without obvious abnormality, atraumatic Eyes: conjunctivae/corneas clear. PERRL, EOM's intact.  Throat: lips, mucosa, and tongue normal; teeth and gums normal Neck: no adenopathy, no carotid bruit, no JVD, supple, symmetrical, trachea midline and thyroid not enlarged, symmetric, no tenderness/mass/nodules Resp: Coarse breath sounds bilaterally. No definite  wheezing or crackles. Cardio: S1, S2 is irregularly irregular. No S3, S4. No rubs, murmurs, or bruit GI: soft, non-tender; bowel sounds normal; no masses,  no organomegaly Extremities: Left forearm covered with dressing due to skin tear. Dressing was not removed. Limited range of motion of the right wrist with some swelling and pain. Slight warmth to touch. Pulses: 2+ and symmetric Skin: Chronic hyperpigmentation involving lower extremities. Bruising from previous falls noted. Lymph nodes: Cervical, supraclavicular, and axillary nodes normal. Neurologic: Awake, alert. Distracted. Disoriented to place, time, year. Oriented to his son. Cranial nerves II-12 intact. Tongue is midline. No facial asymmetry. Motor strength equal bilateral upper and lower extremities   Labs on Admission: I have personally reviewed following labs and imaging studies  CBC:  Recent Labs Lab 03/04/17 0250  WBC 20.7*  NEUTROABS 11.1*  HGB 14.2  HCT 42.7  MCV 93.2  PLT 254   Basic Metabolic Panel:  Recent Labs Lab 03/04/17 0250  NA 140  K 4.6  CL 94*  CO2 32  GLUCOSE 170*  BUN 46*  CREATININE 1.79*  CALCIUM 9.1   GFR: Estimated Creatinine Clearance: 42.8 mL/min (A) (by C-G formula based on SCr of 1.79 mg/dL (H)). Liver Function Tests:  Recent Labs Lab 03/04/17 0250  AST 22  ALT 10*  ALKPHOS 71  BILITOT 0.8  PROT 7.7  ALBUMIN 2.7*   Coagulation Profile:  Recent Labs Lab 03/04/17 0250  INR 3.12   Cardiac Enzymes:  Recent Labs Lab 03/04/17 0250  TROPONINI 0.03*     Radiological Exams on Admission: Ct Angio Chest Pe W/cm &/or Wo Cm  Result Date: 03/04/2017 CLINICAL DATA:  Acute onset of generalized chest pain and shortness of breath. Tachycardia. Decreased O2 saturation. Initial encounter. EXAM: CT ANGIOGRAPHY CHEST WITH CONTRAST TECHNIQUE: Multidetector CT imaging of the chest was performed using the standard protocol during bolus administration of intravenous contrast.  Multiplanar CT image reconstructions and MIPs were obtained to evaluate the vascular anatomy. CONTRAST:  80 mL of Isovue 370 IV contrast COMPARISON:  Chest radiograph performed earlier today at 2:33 a.m., and CT of the chest performed 09/19/2016 FINDINGS: Cardiovascular:  There is no evidence of pulmonary embolus. The heart is borderline normal in size. Diffuse coronary artery calcifications are seen. Scattered calcification is noted along the thoracic aorta and proximal great vessels. Mediastinum/Nodes: Scattered  enlarged mediastinal and left hilar nodes are seen, measuring up to 1.9 cm in short axis, particularly at the right paratracheal region and azygoesophageal recess. No pericardial effusion is identified. The thyroid gland is unremarkable. No axillary lymphadenopathy is appreciated. Lungs/Pleura: Mild scattered atelectasis is noted bilaterally. The lungs are difficult to fully assess due to motion artifact. No pleural effusion or pneumothorax is seen. No masses are identified. Upper Abdomen: The visualized portions of the liver and spleen are grossly unremarkable. Stones are noted dependently within the gallbladder. The gallbladder is mildly distended but otherwise unremarkable. The visualized portions of the pancreas and adrenal glands are within normal limits. Nonspecific perinephric stranding is noted bilaterally. Musculoskeletal: No acute osseous abnormalities are identified. The visualized musculature is unremarkable in appearance. Review of the MIP images confirms the above findings. IMPRESSION: 1. No evidence of pulmonary embolus. 2. Mild scattered atelectasis noted bilaterally. 3. Enlarged mediastinal and left hilar nodes, measuring up to 1.9 cm in short axis. These are persistent from the prior study, and could reflect underlying lymphoma or inflammatory disease. Several nodes would be amenable to biopsy, as deemed clinically appropriate. 4. Diffuse coronary artery calcifications seen. 5.  Cholelithiasis. Gallbladder mildly distended but otherwise unremarkable. Electronically Signed   By: Garald Balding M.D.   On: 03/04/2017 05:24   Dg Chest Portable 1 View  Result Date: 03/04/2017 CLINICAL DATA:  Status post fall out of bed, with shortness of breath and right shoulder pain. Initial encounter. EXAM: PORTABLE CHEST 1 VIEW COMPARISON:  Chest radiograph performed 01/02/2017 FINDINGS: There is mild elevation of the right hemidiaphragm. Vascular congestion is noted. Mild interstitial prominence may reflect transient interstitial edema. No pneumothorax is seen. The cardiomediastinal silhouette is normal in size. No acute osseous abnormalities are identified. IMPRESSION: Mild elevation of the right hemidiaphragm. Vascular congestion noted. Mild interstitial prominence may reflect transient interstitial edema. No displaced rib fracture seen. Electronically Signed   By: Garald Balding M.D.   On: 03/04/2017 02:58    My interpretation of Electrocardiogram: Atrial fibrillation at 100 bpm. Right bundle branch block. No acute ischemic changes are noted.   Problem List  Principal Problem:   Sepsis secondary to UTI [facility]) Active Problems:   Obesity hypoventilation syndrome (HCC)   ATRIAL FIBRILLATION   Chronic respiratory failure (HCC)   Chronic diastolic CHF (congestive heart failure) (HCC)   CLL (chronic lymphocytic leukemia) (HCC)   Diabetes mellitus with polyneuropathy (HCC)   Acute lower UTI   Assessment: This is a 83 year old Caucasian male who has a past history as stated earlier, who presents after rolling off the bed at his assisted living facility, resulting in skin tear involving the left arm. No injuries were noted. He then developed shortness of breath, but evaluation was unremarkable. He has been more more confused over the past 1 week and evaluation has revealed an urinary tract infection. Patient also has pain in his right wrist with limited range of motion. This could be acute  gout.  Plan: #1 Sepsis due to acute UTI: Follow up on urine cultures. Treat with ceftriaxone for now. Blood pressure, heart rate stable at this time. He was tachycardic initially.  #2 Fall resulting in skin tear of the left forearm: Wound care nurse will be consulted. PT and OT evaluation. Patient denies any headaches. No head injury. No focal deficits are noted.  #3 Right wrist pain, likely acute gout: Continue allopurinol. Uric acid was 12 back in April. Consider repeating. He may need to be on a higher dose  of allopurinol. We will get imaging studies to rule out any fractures. Patient has chronic kidney disease and so cannot use NSAIDs. We will proceed with using steroids with quick taper.  #4 chronic kidney disease, stage III: Creatinine seems to be around his baseline. He has had values as high as 1.9 in the last few months. His blood pressure was soft, initially. And he did look a little dry, so hold his diuretic for her today and resume from tomorrow. Monitor urine output.   #5 chronic diastolic congestive heart failure: CT scan did not show any pulmonary edema. Due to slightly higher creatinine and some evidence for volume depletion on examination we will hold his diuretics for 24 hours and resume from tomorrow morning. He is followed by cardiology as an outpatient.  #6 history of diabetes mellitus with chronic kidney disease; he is on Lantus which will be continued at a slightly lower dose. SSI. HbA1c was 6.8 in May.  #7 Dyspnea: Reason for his dyspnea has not entirely clear. His CT scan did not show any pulmonary embolism or interstitial edema, or pneumonias. Could have been due to sepsis. Symptoms appear to have improved. Continue oxygen. Troponin was 0.03. We will repeat.  #8 history of OSA/OHS/chronic hypoxic respiratory failure on home oxygen and BiPAP: He uses BiPAP at night, which will be continued.  #9 history of chronic atrial fibrillation: He is on warfarin, which will be  continued per pharmacy. INR is supratherapeutic.  #10 History of CLL: This explains his chronic leukocytosis. Followed by oncology as an outpatient. Stable.  DVT Prophylaxis: He is on warfarin Code Status: Discussed with his son. Patient is DO NOT RESUSCITATE Family Communication: Discussed with the patient's son  Consults called: None  Admission status: Inpatient   Severity of Illness: The appropriate patient status for this patient is INPATIENT. Inpatient status is judged to be reasonable and necessary in order to provide the required intensity of service to ensure the patient's safety. The patient's presenting symptoms, physical exam findings, and initial radiographic and laboratory data in the context of their chronic comorbidities is felt to place them at high risk for further clinical deterioration. Furthermore, it is not anticipated that the patient will be medically stable for discharge from the hospital within 2 midnights of admission. The following factors support the patient status of inpatient.   " The patient's presenting symptoms include dyspnea, confusion. " The worrisome physical exam findings include skin tear, right wrist pain, confusion " The initial radiographic and laboratory data are worrisome because of urinary tract infection. " The chronic co-morbidities include diabetes, chronic diastolic CHF, atrial fibrillation.   * I certify that at the point of admission it is my clinical judgment that the patient will require inpatient hospital care spanning beyond 2 midnights from the point of admission due to high intensity of service, high risk for further deterioration and high frequency of surveillance required.*  Further management decisions will depend on results of further testing and patient's response to treatment.   [facility]  [facility] Pager [phone]  If 7PM-7AM, please contact night-coverage [email or web address] Password [facility]  03/04/2017, 7:46 AM

## 2017-03-04 NOTE — ED Notes (Signed)
Pt reports chest pain and SOB s-tachy on monitor rate 130's 12 lead completed and given to EDP. O2 sat decreased to 80's placed on NRB mask sat returned to 90's after a few minutes and pt returned to N/C 3L  Sat remained 94%. PA at bedside entire time.

## 2017-03-04 NOTE — ED Triage Notes (Signed)
Patient comes from [place] after falling out of bed. Patient denies LOC, A/Ox4, states he remembers everything after falling. Patient sustained skin tears to his left arm. Pt c/o arthritic pain in rt shoulder.

## 2017-03-05 LAB — GLUCOSE, CAPILLARY
Glucose-Capillary: 261 mg/dL — ABNORMAL HIGH (ref 65–99)
Glucose-Capillary: 369 mg/dL — ABNORMAL HIGH (ref 65–99)
Glucose-Capillary: 227 mg/dL — ABNORMAL HIGH (ref 65–99)
Glucose-Capillary: 202 mg/dL — ABNORMAL HIGH (ref 65–99)

## 2017-03-05 LAB — CBC
HCT: 37.9 % — ABNORMAL LOW (ref 39.0–52.0)
Hemoglobin: 12.3 g/dL — ABNORMAL LOW (ref 13.0–17.0)
MCH: 29.8 pg (ref 26.0–34.0)
MCHC: 32.5 g/dL (ref 30.0–36.0)
MCV: 91.8 fL (ref 78.0–100.0)
PLATELETS: 203 10*3/uL (ref 150–400)
RBC: 4.13 MIL/uL — AB (ref 4.22–5.81)
RDW: 17.8 % — ABNORMAL HIGH (ref 11.5–15.5)
WBC: 15.1 10*3/uL — AB (ref 4.0–10.5)

## 2017-03-05 LAB — BASIC METABOLIC PANEL
Anion gap: 10 (ref 5–15)
BUN: 48 mg/dL — ABNORMAL HIGH (ref 6–20)
CALCIUM: 8.5 mg/dL — AB (ref 8.9–10.3)
CO2: 32 mmol/L (ref 22–32)
CREATININE: 1.67 mg/dL — AB (ref 0.61–1.24)
Chloride: 94 mmol/L — ABNORMAL LOW (ref 101–111)
GFR calc Af Amer: 43 mL/min — ABNORMAL LOW (ref >60)
GFR, EST NON AFRICAN AMERICAN: 37 mL/min — AB (ref >60)
Glucose, Bld: 320 mg/dL — ABNORMAL HIGH (ref 65–99)
Potassium: 4.3 mmol/L (ref 3.5–5.1)
SODIUM: 136 mmol/L (ref 135–145)

## 2017-03-05 LAB — PROTIME-INR
INR: 3.6
Prothrombin Time: 36.8 seconds — ABNORMAL HIGH (ref 11.4–15.2)

## 2017-03-05 LAB — LACTIC ACID, PLASMA
LACTIC ACID, VENOUS: 3.8 mmol/L — AB (ref 0.5–1.9)
LACTIC ACID, VENOUS: 4.5 mmol/L — AB (ref 0.5–1.9)
LACTIC ACID, VENOUS: 2 mmol/L — AB (ref 0.5–1.9)

## 2017-03-05 LAB — URIC ACID: URIC ACID, SERUM: 9.3 mg/dL — AB (ref 4.4–7.6)

## 2017-03-05 MED ORDER — SODIUM CHLORIDE 0.9 % IV SOLN: 250.0000 mL | INTRAVENOUS | Status: DC | PRN

## 2017-03-05 MED ORDER — SODIUM CHLORIDE 0.9 % IV BOLUS (SEPSIS): 250.0000 mL | Freq: Once | INTRAVENOUS | Status: DC

## 2017-03-05 MED ORDER — INSULIN ASPART 100 UNIT/ML ~~LOC~~ SOLN: 0.0000 [IU] | Freq: Three times a day (TID) | SUBCUTANEOUS | Status: DC

## 2017-03-05 MED ORDER — PREDNISONE 20 MG PO TABS
40.0000 mg | ORAL_TABLET | Freq: Every day | ORAL | Status: DC
  Administered 2017-03-06 – 2017-03-07 (×2): 40 mg via ORAL
  Filled 2017-03-05 (×2): qty 2

## 2017-03-05 MED ORDER — SODIUM CHLORIDE 0.9 % IV BOLUS (SEPSIS)
250.0000 mL | Freq: Once | INTRAVENOUS | Status: AC
  Administered 2017-03-05: 250 mL via INTRAVENOUS

## 2017-03-05 MED ORDER — VANCOMYCIN HCL 10 G IV SOLR
1250.0000 mg | INTRAVENOUS | Status: DC
  Administered 2017-03-06 – 2017-03-08 (×3): 1250 mg via INTRAVENOUS
  Filled 2017-03-05 (×4): qty 1250

## 2017-03-05 MED ORDER — INSULIN ASPART 100 UNIT/ML ~~LOC~~ SOLN
0.0000 [IU] | Freq: Three times a day (TID) | SUBCUTANEOUS | Status: DC
Start: 2017-03-05 — End: 2017-03-09
  Administered 2017-03-05: 20 [IU] via SUBCUTANEOUS
  Administered 2017-03-05 – 2017-03-06 (×2): 3 [IU] via SUBCUTANEOUS
  Administered 2017-03-06: 20 [IU] via SUBCUTANEOUS
  Administered 2017-03-06: 4 [IU] via SUBCUTANEOUS
  Administered 2017-03-07: 7 [IU] via SUBCUTANEOUS
  Administered 2017-03-07: 4 [IU] via SUBCUTANEOUS
  Administered 2017-03-07: 15 [IU] via SUBCUTANEOUS
  Administered 2017-03-08: 7 [IU] via SUBCUTANEOUS
  Administered 2017-03-08: 4 [IU] via SUBCUTANEOUS
  Administered 2017-03-08: 15 [IU] via SUBCUTANEOUS
  Administered 2017-03-09: 4 [IU] via SUBCUTANEOUS

## 2017-03-05 MED ORDER — VANCOMYCIN HCL 10 G IV SOLR
2000.0000 mg | Freq: Once | INTRAVENOUS | Status: AC
  Administered 2017-03-05: 2000 mg via INTRAVENOUS
  Filled 2017-03-05: qty 2000

## 2017-03-05 MED ORDER — SODIUM CHLORIDE 0.9 % IV SOLN: INTRAVENOUS | Status: AC

## 2017-03-05 MED ORDER — INSULIN ASPART 100 UNIT/ML ~~LOC~~ SOLN
0.0000 [IU] | Freq: Every day | SUBCUTANEOUS | Status: DC
  Administered 2017-03-05: 2 [IU] via SUBCUTANEOUS
  Administered 2017-03-06 – 2017-03-08 (×3): 4 [IU] via SUBCUTANEOUS

## 2017-03-05 NOTE — Progress Notes (Signed)
CRITICAL VALUE ALERT  Critical Value:  La 3.8  Date & Time Notied:  03/05/17 1241  Provider Notified: Dr. Darrick Meigs

## 2017-03-05 NOTE — Progress Notes (Signed)
[facility]  PROGRESS NOTE  William Braun ZWC:[id number] DOB: 08-02-1933 DOA: 03/04/2017 PCP: Hendricks Limes, MD   Brief HPI:    83 y.o. male with a past medical history of atrial fibrillation on anticoagulation with warfarin, history of COPD, history of OSA/OHS on BiPAP at night and O2 at daytime, chronic kidney disease stage III, history of gout on allopurinol, history of diabetes mellitus on insulin, history of chronic diastolic CHF with pulmonary hypertension, who lives in an assisted living facility. Patient apparently had a fall last night. He rolled out of his bed and the injured his left forearm resulting in a skin tear. He denies any head injuries. No headaches. While he was being evaluated in the emergency department, patient developed shortness of breath. A CT angiogram of the chest was done which did not show any PE. No interstitial edema was noted either. He was found to have abnormal UA and started on ceftriaxone   Subjective   This morning patient denies any pain. No shortness of breath. Urine culture growing staph aureus 70,000 colonies/ml.   Assessment/Plan:     1. Sepsis due to UTI- Urine culture growing staph aureus, 70,000 colonies per mL. Patient was started on ceftriaxone, has been hypotensive this morning. Will add vancomycin per pharmacy consultation. Await final culture and sensitivity results. Patient did get Demadex 60 mg in the morning. Will hold Demadex and Toprol-XL as patient's blood pressure 77/45 with map in 50s. Start gentle IV hydration with normal saline at 100 ML per hour for 8 hours. Will try to avoid fluid overload as patient does have history of congestive heart failure. 2. Fall resulting in skin tear of left forearm- left forearm in dressing, wound care consulted. Patient denies passing out. 3. Right wrist pain- improved after patient started on prednisone conjunctivae are prednisone 40 mg by mouth daily from tomorrow morning. 4. Chronic kidney  disease stage III- patient's creatinine is 1.67,  at baseline. Will follow BMP in a.m. 5. Chronic diastolic CHF- does not appear to be in acute CHF at this time. Euvolemic. Has been started on IV fluids. Will keep a close watch on intake and output. 6. History of diabetes mellitus- continue Lantus, sliding scale insulin with NovoLog. 7. Chronic hypoxic respiratory failure/OSA/OHS- patient is on BiPAP at night along with home oxygen. Continue BiPAP and oxygen in the hospital. 8. History of atrial fibrillation- patient is currently on warfarin. Continue warfarin per pharmacy consultation. 9. History of CLL- stable. Followed by oncology as outpatient.    DVT prophylaxis: Coumadin  Code Status: DO NOT RESUSCITATE  Family Communication: No family present at bedside   Disposition Plan: Pending improvement in sepsis   Consultants:  None  Procedures:  None  Continuous infusions . sodium chloride    . cefTRIAXone (ROCEPHIN)  IV Stopped (03/05/17 0534)  . sodium chloride        Antibiotics:   Anti-infectives    Start     Dose/Rate Route Frequency Ordered Stop   03/05/17 0600  cefTRIAXone (ROCEPHIN) 1 g in dextrose 5 % 50 mL IVPB     1 g 100 mL/hr over 30 Minutes Intravenous Every 24 hours 03/04/17 0853     03/04/17 0615  cefTRIAXone (ROCEPHIN) 1 g in dextrose 5 % 50 mL IVPB     1 g 100 mL/hr over 30 Minutes Intravenous  Once 03/04/17 0601 03/04/17 0855       Objective   Vitals:   03/05/17 0937 03/05/17 1000 03/05/17 1036 03/05/17  1102  BP: (!) 88/62 (!) 87/57 (!) 93/54 105/67  Pulse: 82 82 82 89  Resp: (!) 22 (!) 23 16 17   Temp:      TempSrc:      SpO2: 93% 96% 92% (!) 86%  Weight:        Intake/Output Summary (Last 24 hours) at 03/05/17 1135 Last data filed at 03/05/17 1043  Gross per 24 hour  Intake             1166 ml  Output             1400 ml  Net             -234 ml   Filed Weights   03/05/17 3546  Weight: 112.3 kg (247 lb 9.2 oz)     Physical  Examination:   Physical Exam: Eyes: No icterus, extraocular muscles intact  Mouth: Oral mucosa is moist, no lesions on palate,  Neck: Supple, no deformities, masses, or tenderness Lungs: Normal respiratory effort, bilateral clear to auscultation, no crackles or wheezes.  Heart: Regular rate and rhythm, S1 and S2 normal, no murmurs, rubs auscultated Abdomen: BS normoactive,soft,nondistended,non-tender to palpation,no organomegaly Extremities: Trace pretibial edema, no erythema, no cyanosis, no clubbing Neuro : Alert and oriented to time, place and person, No focal deficits  Skin: Left arm in  dressing     Data Reviewed: I have personally reviewed following labs and imaging studies  CBG:  Recent Labs Lab 03/04/17 0859 03/04/17 1218 03/04/17 1719 03/04/17 2229 03/05/17 0732  GLUCAP 119* 100* 206* 356* 261*    CBC:  Recent Labs Lab 03/04/17 0250 03/05/17 0345  WBC 20.7* 15.1*  NEUTROABS 11.1*  --   HGB 14.2 12.3*  HCT 42.7 37.9*  MCV 93.2 91.8  PLT 211 568    Basic Metabolic Panel:  Recent Labs Lab 03/04/17 0250 03/05/17 0345  NA 140 136  K 4.6 4.3  CL 94* 94*  CO2 32 32  GLUCOSE 170* 320*  BUN 46* 48*  CREATININE 1.79* 1.67*  CALCIUM 9.1 8.5*    Recent Results (from the past 240 hour(s))  Culture, blood (routine x 2)     Status: None (Preliminary result)   Collection Time: 03/04/17  2:56 AM  Result Value Ref Range Status   Specimen Description BLOOD RIGHT ANTECUBITAL  Final   Special Requests   Final    BOTTLES DRAWN AEROBIC AND ANAEROBIC Blood Culture adequate volume   Culture   Final    NO GROWTH 1 DAY Performed at [facility], [address], [place], [place] [address]    Report Status PENDING  Incomplete  Culture, blood (routine x 2)     Status: None (Preliminary result)   Collection Time: 03/04/17  4:02 AM  Result Value Ref Range Status   Specimen Description BLOOD RIGHT ANTECUBITAL  Final   Special Requests   Final    BOTTLES  DRAWN AEROBIC AND ANAEROBIC Blood Culture adequate volume   Culture   Final    NO GROWTH 1 DAY Performed at [facility], [place] [address] [place], [place] [address]    Report Status PENDING  Incomplete  Urine culture     Status: Abnormal (Preliminary result)   Collection Time: 03/04/17  4:51 AM  Result Value Ref Range Status   Specimen Description URINE, RANDOM  Final   Special Requests NONE  Final   Culture (A)  Final    70,000 COLONIES/mL STAPHYLOCOCCUS AUREUS SUSCEPTIBILITIES TO  FOLLOW Performed at [facility], [place] [address], [place], [place] [address]    Report Status PENDING  Incomplete  MRSA PCR Screening     Status: None   Collection Time: 03/04/17  8:46 AM  Result Value Ref Range Status   MRSA by PCR NEGATIVE NEGATIVE Final    Comment:        The GeneXpert MRSA Assay (FDA approved for NASAL specimens only), is one component of a comprehensive MRSA colonization surveillance program. It is not intended to diagnose MRSA infection nor to guide or monitor treatment for MRSA infections.      Liver Function Tests:  Recent Labs Lab 03/04/17 0250  AST 22  ALT 10*  ALKPHOS 71  BILITOT 0.8  PROT 7.7  ALBUMIN 2.7*   No results for input(s): LIPASE, AMYLASE in the last 168 hours. No results for input(s): AMMONIA in the last 168 hours.  Cardiac Enzymes:  Recent Labs Lab 03/04/17 0250 03/04/17 1213 03/04/17 1821  TROPONINI 0.03* 0.04* 0.03*   BNP (last 3 results)  Recent Labs  11/14/16 0353 12/26/16 1449 12/28/16 1955  BNP 89.0 112.8* 141.7*    ProBNP (last 3 results) No results for input(s): PROBNP in the last 8760 hours.    Studies: Dg Wrist 2 Views Right  Result Date: 03/04/2017 CLINICAL DATA:  Pain in right wrist due to gout per pt ; no recent injury; pt id checked with RN EXAM: RIGHT WRIST - 2 VIEW COMPARISON:  12/29/2016, 12/30/2016 MRI FINDINGS: There is no acute fracture or dislocation. There are well corticated erosions in  the interphalangeal joint of the thumb and the metacarpal phalangeal joints, consistent with the history of gout. Bone mineral density is normal. There is atherosclerotic calcification of the small arteries. Soft tissue swelling of the thumb. Joint space narrowing and corticated erosions of the first carpometacarpal joint. IMPRESSION: 1.  No evidence for acute  abnormality. 2. Findings are compatible with gout arthropathy and appear stable. Electronically Signed   By: Nolon Nations M.D.   On: 03/04/2017 11:21   Ct Angio Chest Pe W/cm &/or Wo Cm  Result Date: 03/04/2017 CLINICAL DATA:  Acute onset of generalized chest pain and shortness of breath. Tachycardia. Decreased O2 saturation. Initial encounter. EXAM: CT ANGIOGRAPHY CHEST WITH CONTRAST TECHNIQUE: Multidetector CT imaging of the chest was performed using the standard protocol during bolus administration of intravenous contrast. Multiplanar CT image reconstructions and MIPs were obtained to evaluate the vascular anatomy. CONTRAST:  80 mL of Isovue 370 IV contrast COMPARISON:  Chest radiograph performed earlier today at 2:33 a.m., and CT of the chest performed 09/19/2016 FINDINGS: Cardiovascular:  There is no evidence of pulmonary embolus. The heart is borderline normal in size. Diffuse coronary artery calcifications are seen. Scattered calcification is noted along the thoracic aorta and proximal great vessels. Mediastinum/Nodes: Scattered enlarged mediastinal and left hilar nodes are seen, measuring up to 1.9 cm in short axis, particularly at the right paratracheal region and azygoesophageal recess. No pericardial effusion is identified. The thyroid gland is unremarkable. No axillary lymphadenopathy is appreciated. Lungs/Pleura: Mild scattered atelectasis is noted bilaterally. The lungs are difficult to fully assess due to motion artifact. No pleural effusion or pneumothorax is seen. No masses are identified. Upper Abdomen: The visualized portions of the  liver and spleen are grossly unremarkable. Stones are noted dependently within the gallbladder. The gallbladder is mildly distended but otherwise unremarkable. The visualized portions of the pancreas and adrenal glands are within normal limits. Nonspecific perinephric  stranding is noted bilaterally. Musculoskeletal: No acute osseous abnormalities are identified. The visualized musculature is unremarkable in appearance. Review of the MIP images confirms the above findings. IMPRESSION: 1. No evidence of pulmonary embolus. 2. Mild scattered atelectasis noted bilaterally. 3. Enlarged mediastinal and left hilar nodes, measuring up to 1.9 cm in short axis. These are persistent from the prior study, and could reflect underlying lymphoma or inflammatory disease. Several nodes would be amenable to biopsy, as deemed clinically appropriate. 4. Diffuse coronary artery calcifications seen. 5. Cholelithiasis. Gallbladder mildly distended but otherwise unremarkable. Electronically Signed   By: Garald Balding M.D.   On: 03/04/2017 05:24   Dg Chest Portable 1 View  Result Date: 03/04/2017 CLINICAL DATA:  Status post fall out of bed, with shortness of breath and right shoulder pain. Initial encounter. EXAM: PORTABLE CHEST 1 VIEW COMPARISON:  Chest radiograph performed 01/02/2017 FINDINGS: There is mild elevation of the right hemidiaphragm. Vascular congestion is noted. Mild interstitial prominence may reflect transient interstitial edema. No pneumothorax is seen. The cardiomediastinal silhouette is normal in size. No acute osseous abnormalities are identified. IMPRESSION: Mild elevation of the right hemidiaphragm. Vascular congestion noted. Mild interstitial prominence may reflect transient interstitial edema. No displaced rib fracture seen. Electronically Signed   By: Garald Balding M.D.   On: 03/04/2017 02:58    Scheduled Meds: . allopurinol  100 mg Oral Daily  . atorvastatin  10 mg Oral q1800  . divalproex  250 mg Oral  BID  . insulin aspart  0-20 Units Subcutaneous TID WC  . insulin aspart  0-5 Units Subcutaneous QHS  . insulin glargine  10 Units Subcutaneous QHS  . mometasone-formoterol  2 puff Inhalation BID  . nystatin cream  1 application Topical BID  . polyethylene glycol  17 g Oral Daily  . [START ON 03/06/2017] predniSONE  40 mg Oral Q breakfast  . sodium chloride flush  3 mL Intravenous Q12H  . sodium chloride flush  3 mL Intravenous Q12H  . tiotropium  18 mcg Inhalation Daily  . torsemide  60 mg Oral BID  . Warfarin - Pharmacist Dosing Inpatient   Does not apply q1800      Time spent: 25 min  [facility] Pager [phone]. If 7PM-7AM, please contact night-coverage at [email or web address], Office  [phone]  password [email or web address] 03/05/2017, 11:35 AM  LOS: 1 day

## 2017-03-05 NOTE — Progress Notes (Signed)
Pt reports sharp non-radiating central CP w/ associated SOB. No N/V, numbness/tingling, or any other sx noted. MD notified.  VSS. Will continue to monitor closely.

## 2017-03-05 NOTE — Progress Notes (Signed)
EKG completed. Pt states CP and SOB have resolved. VSS. Will continue to monitor closely.

## 2017-03-05 NOTE — Evaluation (Signed)
Occupational Therapy Evaluation Patient Details Name: William FITCHETT MRN: [id number] DOB: 01-29-1933 Today's Date: 03/05/2017    History of Present Illness Pt was admitted from [facility] with sepsis secondary to UTI.  PMH:  A Fib, COPD, bipap at night, DM, CHF, CLL   Clinical Impression   This 83 year old man was admitted from [facility], where he was receiving therapy. He has needed assistance with ADLs since he was admitted there.  He is very motivated, but he was limited by pain in R hand and also cardiopulmonary measures.  See goals below for acute stay    Follow Up Recommendations  SNF    Equipment Recommendations  3 in 1 bedside commode    Recommendations for Other Services       Precautions / Restrictions Precautions Precautions: Fall Restrictions Weight Bearing Restrictions: No Other Position/Activity Restrictions: pt has possible gout in RUE      Mobility Bed Mobility Overal bed mobility: Needs Assistance Bed Mobility: Supine to Sit     Supine to sit: Min assist;+2 for safety/equipment     General bed mobility comments: HOB raised; cues for sequence; light assistance for trunk; 2nd person for lines  Transfers Overall transfer level: Needs assistance Equipment used: Rolling walker (2 wheeled) Transfers: Sit to/from [facility] Sit to Stand: Min assist;+2 safety/equipment Stand pivot transfers: Min assist;+2 safety/equipment;From elevated surface       General transfer comment: assist to rise and stabilize.  Pt supported RUE on walker but did not grip     Balance                                           ADL either performed or assessed with clinical judgement   ADL Overall ADL's : Needs assistance/impaired Eating/Feeding: Moderate assistance;Sitting   Grooming: Moderate assistance;Sitting (can use LUE for some)   Upper Body Bathing: Maximal assistance;Sitting   Lower Body Bathing: Maximal assistance;+2 for  safety/equipment;Sit to/from stand   Upper Body Dressing : Maximal assistance;Sitting   Lower Body Dressing: Total assistance;+2 for safety/equipment;Sit to/from stand   Toilet Transfer: Minimal assistance;+2 for safety/equipment;Stand-pivot (bed to chair)             General ADL Comments: pt is R dominant; difficulty with ADLs due to this.  Also increased WOB when sitting EOB. Cues given to take slow deep breaths.  BPs low this am--see PT note.  Sats in 90s on 2 liters and HR 85-117     Vision         Perception     Praxis      Pertinent Vitals/Pain Pain Assessment: Faces Faces Pain Scale: Hurts little more Pain Location: R hand Pain Intervention(s): Limited activity within patient's tolerance;Monitored during session;Premedicated before session     Hand Dominance Right   Extremity/Trunk Assessment Upper Extremity Assessment Upper Extremity Assessment: Generalized weakness;RUE deficits/detail RUE Deficits / Details: RUE hand wrist painful and limited ROM due to possible gout flare.  FF to 90           Communication Communication Communication: No difficulties   Cognition Arousal/Alertness: Awake/alert Behavior During Therapy: WFL for tasks assessed/performed Overall Cognitive Status: Impaired/Different from baseline Area of Impairment: Memory                     Memory: Decreased short-term memory  General Comments: pt defers questions to wife.  Decreased memory, follows commands consistently (1 step)   General Comments       Exercises     Shoulder Instructions      Home Living Family/patient expects to be discharged to:: Skilled nursing facility                                 Additional Comments: was receiving therapy at [facility]      Prior Functioning/Environment Level of Independence: Needs assistance        Comments: assist for ADLs and ambulating. Used cane or RW        OT Problem List: Decreased  strength;Decreased activity tolerance;Impaired balance (sitting and/or standing);Decreased cognition;Decreased knowledge of use of DME or AE;Pain;Impaired UE functional use;Cardiopulmonary status limiting activity      OT Treatment/Interventions: Self-care/ADL training;DME and/or AE instruction;Balance training;Patient/family education;Therapeutic activities;Cognitive remediation/compensation;Therapeutic exercise;Energy conservation    OT Goals(Current goals can be found in the care plan section) Acute Rehab OT Goals Patient Stated Goal: "I'll do anything if I'm able" OT Goal Formulation: With patient/family Time For Goal Achievement: 03/19/17 Potential to Achieve Goals: Good ADL Goals Pt Will Perform Grooming: with min assist;sitting Pt Will Transfer to Toilet: with min guard assist;stand pivot transfer;bedside commode Additional ADL Goal #1: pt will self feed with built up handle for RUE or using LUE with full set up Additional ADL Goal #2: pt will perform 10 reps of A/AAROM to bil shoulders to increase strength for adls  OT Frequency: Min 2X/week   Barriers to D/C:            Co-evaluation PT/OT/SLP Co-Evaluation/Treatment: Yes Reason for Co-Treatment: For patient/therapist safety;Complexity of the patient's impairments (multi-system involvement) PT goals addressed during session: Mobility/safety with mobility OT goals addressed during session: ADL's and self-care      AM-PAC PT "6 Clicks" Daily Activity     Outcome Measure Help from another person eating meals?: A Lot Help from another person taking care of personal grooming?: A Lot Help from another person toileting, which includes using toliet, bedpan, or urinal?: A Lot Help from another person bathing (including washing, rinsing, drying)?: A Lot Help from another person to put on and taking off regular upper body clothing?: A Lot Help from another person to put on and taking off regular lower body clothing?: Total 6 Click  Score: 11   End of Session Nurse Communication: Mobility status  Activity Tolerance: Treatment limited secondary to medical complications (Comment) (vitals) Patient left: in chair;with call bell/phone within reach;with family/visitor present  OT Visit Diagnosis: Unsteadiness on feet (R26.81);Muscle weakness (generalized) (M62.81);Pain Pain - Right/Left: Right Pain - part of body: Hand                Time: 7510-2585 OT Time Calculation (min): 33 min Charges:  OT General Charges $OT Visit: 1 Procedure OT Evaluation $OT Eval Moderate Complexity: 1 Procedure G-Codes:     [place], OTR/L [phone] 03/05/2017  Leondra Cullin 03/05/2017, 11:35 AM

## 2017-03-05 NOTE — Progress Notes (Signed)
ANTICOAGULATION CONSULT NOTE - Follow - Up Consult  Pharmacy Consult for warfarin Indication: atrial fibrillation  Allergies  Allergen Reactions  . Quinine Palpitations and Other (See Comments)    Seizure   . Uloric [Febuxostat] Rash    Patient Measurements: Weight: 247 lb 9.2 oz (112.3 kg) Heparin Dosing Weight:   Vital Signs: Temp: 97.2 F (36.2 C) (06/17 0606) Temp Source: Axillary (06/17 0606) BP: 92/62 (06/17 0630) Pulse Rate: 76 (06/17 0630)  Labs:  Recent Labs  03/04/17 0250 03/04/17 1213 03/04/17 1821 03/05/17 0345  HGB 14.2  --   --  12.3*  HCT 42.7  --   --  37.9*  PLT 211  --   --  203  LABPROT 32.8*  --   --  36.8*  INR 3.12  --   --  3.60  CREATININE 1.79*  --   --  1.67*  TROPONINI 0.03* 0.04* 0.03*  --     Estimated Creatinine Clearance: 46.2 mL/min (A) (by C-G formula based on SCr of 1.67 mg/dL (H)).   Medical History: Past Medical History:  Diagnosis Date  . Atrial fibrillation ([place])   . Cerebrovascular disease   . CHF (congestive heart failure) ([place])   . CLL (chronic lymphocytic leukemia) ([place]) 11/17/2014  . COPD (chronic obstructive pulmonary disease) ([place])   . History of thrombocytopenia   . Obesity   . Osteoarthritis   . Other and unspecified hyperlipidemia   . Type II or unspecified type diabetes mellitus without mention of complication, not stated as uncontrolled   . Unspecified essential hypertension     Medications:  Scheduled:  . allopurinol  100 mg Oral Daily  . atorvastatin  10 mg Oral q1800  . divalproex  250 mg Oral BID  . insulin aspart  0-5 Units Subcutaneous QHS  . insulin aspart  0-9 Units Subcutaneous TID WC  . insulin glargine  10 Units Subcutaneous QHS  . metoprolol succinate  25 mg Oral Daily  . mometasone-formoterol  2 puff Inhalation BID  . nystatin cream  1 application Topical BID  . polyethylene glycol  17 g Oral Daily  . predniSONE  40 mg Oral BID WC  . sodium chloride flush  3 mL Intravenous Q12H  .  sodium chloride flush  3 mL Intravenous Q12H  . tiotropium  18 mcg Inhalation Daily  . torsemide  60 mg Oral BID  . Warfarin - Pharmacist Dosing Inpatient   Does not apply q1800    Assessment: Pharmacy is consulted to dose warfarin in 83 yo male with past history of atrial fibrillation.   Pt was admitted after a fall at home. Per note, there is a skin tear on the left forearm. No head injury. No focal deficits noted. Pt also admitted with sepsis due to UTI.    Pt's home regimen is warfarin 5 mg on TuThurSat and 2.5 mg PO on MWFSun.  Today, 03/05/17   INR 3.60  PT 36.8  Hgb 12.3, Plt 203  DDI: starting ceftriaxone, low likelihood of major interaction  Thin liquid diet ordered, no meals charted on flowsheet on 6/16    Goal of Therapy:  INR 2-3 Monitor platelets by anticoagulation protocol: Yes   Plan:   Hold warfarin today as INR is supratherapeutic  Daily INR  Monitor for signs and symptoms of bleeding  Royetta Asal, PharmD, BCPS Pager [phone] 03/05/2017 7:21 AM

## 2017-03-05 NOTE — Progress Notes (Signed)
CRITICAL VALUE ALERT  Critical Value:  LA 4.5  Date & Time Notied:  03/05/17 1512  Provider Notified: Dr. Darrick Meigs

## 2017-03-05 NOTE — Progress Notes (Signed)
CRITICAL VALUE ALERT  Critical Value:  Lactic 2.0  Date & Time Notied:  03/05/17 0133  Provider Notified: Dr. Karleen Hampshire  Orders Received/Actions taken: MD paged. Awaiting orders.

## 2017-03-05 NOTE — Progress Notes (Signed)
Pharmacy Antibiotic Note  William Braun is a 83 y.o. male admitted on 03/04/2017 with sepsis.  Pharmacy has been consulted for vancomycin dosing. Ceftriaxone ordered by MD.  Plan: Vancomycin 2g IV x 1 then 1250 mg IV q24h. Daily SCr.  Weight: 247 lb 9.2 oz (112.3 kg)  Temp (24hrs), Avg:97.4 F (36.3 C), Min:97 F (36.1 C), Max:97.9 F (36.6 C)   Recent Labs Lab 03/04/17 0250 03/05/17 0016 03/05/17 0345  WBC 20.7*  --  15.1*  CREATININE 1.79*  --  1.67*  LATICACIDVEN  --  2.0*  --     Estimated Creatinine Clearance: 46.2 mL/min (A) (by C-G formula based on SCr of 1.67 mg/dL (H)).    Allergies  Allergen Reactions  . Quinine Palpitations and Other (See Comments)    Seizure   . Uloric [Febuxostat] Rash    Antimicrobials this admission: 6/17 ceftriaxone >>  6/17 vancomycin >>   Dose adjustments this admission:  Microbiology results: 6/16 BCx: ngtd 6/16 UCx: 70K cfu/ml S. aureus 6/16 MRSA PCR: negative  Thank you for allowing pharmacy to be a part of this patient's care.  Hershal Coria 03/05/2017 11:40 AM

## 2017-03-05 NOTE — Evaluation (Signed)
Physical Therapy Evaluation Patient Details Name: William Braun MRN: [id number] DOB: December 04, 1932 Today's Date: 03/05/2017   History of Present Illness  Pt was admitted from [facility] with sepsis secondary to UTI.  PMH:  A Fib, COPD, bipap at night, DM, CHF, CLL  Clinical Impression  Pt admitted with above diagnosis. Pt currently with functional limitations due to the deficits listed below (see PT Problem List).  Pt will benefit from skilled PT to increase their independence and safety with mobility to allow discharge to the venue listed below.   Pt has been at [facility] since Feb 2018; recommend SNF at D/C, will follow in acute setting; pt is very cooperative with PT today, he is greatly limited by cardiopulmonary status/incr WOB with minimal activity  VS during session as follows-- 4/4 DOE with sitting EOB BP  85/71-->86/43-->105/67 HR 82-->117-->85 RR 19-->40s-->27 O2 sats on 3L remained in 90s throughout     Follow Up Recommendations SNF;Supervision/Assistance - 24 hour    Equipment Recommendations  None recommended by PT    Recommendations for Other Services       Precautions / Restrictions Precautions Precautions: Fall Restrictions Weight Bearing Restrictions: No Other Position/Activity Restrictions: pt has possible gout in RUE      Mobility  Bed Mobility Overal bed mobility: Needs Assistance Bed Mobility: Supine to Sit     Supine to sit: Min assist;+2 for safety/equipment     General bed mobility comments: HOB raised; cues for sequence; light assistance for trunk; 2nd person for lines  Transfers Overall transfer level: Needs assistance Equipment used: Rolling walker (2 wheeled) Transfers: Sit to/from [facility] Sit to Stand: Min assist;+2 safety/equipment Stand pivot transfers: Min assist;+2 safety/equipment;From elevated surface       General transfer comment: assist to rise and stabilize.  Pt supported RUE on walker but did not grip    Ambulation/Gait             General Gait Details: pivotal steps to chair only d/t incr WOB  Stairs            Wheelchair Mobility    Modified Rankin (Stroke Patients Only)       Balance Overall balance assessment: Needs assistance Sitting-balance support: No upper extremity supported;Feet supported Sitting balance-Leahy Scale: Fair       Standing balance-Leahy Scale: Poor Standing balance comment: reliant on UEs                              Pertinent Vitals/Pain Pain Assessment: Faces Faces Pain Scale: Hurts little more Pain Location: R hand Pain Intervention(s): Monitored during session    Home Living Family/patient expects to be discharged to:: Skilled nursing facility                 Additional Comments: was receiving therapy at [facility]    Prior Function Level of Independence: Needs assistance   Gait / Transfers Assistance Needed: ambulates short distances with RW, followed by WC at SNF (per pt wife)     Comments: assist for ADLs and ambulating. Used cane or RW     Hand Dominance   Dominant Hand: Right    Extremity/Trunk Assessment   Upper Extremity Assessment Upper Extremity Assessment: Defer to OT evaluation;Generalized weakness RUE Deficits / Details: RUE hand wrist painful and limited ROM due to possible gout flare.  FF to 90    Lower Extremity Assessment Lower Extremity Assessment: Generalized weakness;RLE deficits/detail;LLE deficits/detail RLE Deficits /  Details: significant hemosiderin staining and scabs over both lower legs; AROM grossly WFL bil LLE Deficits / Details: same as above       Communication   Communication: No difficulties  Cognition Arousal/Alertness: Awake/alert Behavior During Therapy: WFL for tasks assessed/performed Overall Cognitive Status: Impaired/Different from baseline Area of Impairment: Memory                     Memory: Decreased short-term memory         General  Comments: pt defers questions to wife.  Decreased memory, follows commands consistently (1 step)      General Comments      Exercises     Assessment/Plan    PT Assessment Patient needs continued PT services  PT Problem List Decreased activity tolerance;Decreased balance;Decreased mobility       PT Treatment Interventions DME instruction;Gait training;Functional mobility training;Therapeutic activities;Therapeutic exercise;Patient/family education    PT Goals (Current goals can be found in the Care Plan section)  Acute Rehab PT Goals Patient Stated Goal: "I'll do anything if I'm able" PT Goal Formulation: With patient Time For Goal Achievement: 03/12/17 Potential to Achieve Goals: Good    Frequency Min 3X/week   Barriers to discharge        Co-evaluation   Reason for Co-Treatment: Complexity of the patient's impairments (multi-system involvement) PT goals addressed during session: Mobility/safety with mobility OT goals addressed during session: ADL's and self-care       AM-PAC PT "6 Clicks" Daily Activity  Outcome Measure Difficulty turning over in bed (including adjusting bedclothes, sheets and blankets)?: Total Difficulty moving from lying on back to sitting on the side of the bed? : Total Difficulty sitting down on and standing up from a chair with arms (e.g., wheelchair, bedside commode, etc,.)?: Total Help needed moving to and from a bed to chair (including a wheelchair)?: A Lot Help needed walking in hospital room?: A Lot Help needed climbing 3-5 steps with a railing? : A Lot 6 Click Score: 9    End of Session Equipment Utilized During Treatment: Gait belt Activity Tolerance: Patient limited by fatigue Patient left: in chair;with call bell/phone within reach;with chair alarm set Nurse Communication: Mobility status PT Visit Diagnosis: Other abnormalities of gait and mobility (R26.89)    Time: 8676-1950 PT Time Calculation (min) (ACUTE ONLY): 27  min   Charges:   PT Evaluation $PT Eval Moderate Complexity: 1 Procedure     PT G CodesKenyon Ana, PT Pager: [phone] 03/05/2017   [facility] 03/05/2017, 1:36 PM

## 2017-03-06 LAB — CBC
HEMATOCRIT: 34.9 % — AB (ref 39.0–52.0)
HEMOGLOBIN: 11.6 g/dL — AB (ref 13.0–17.0)
MCH: 30.2 pg (ref 26.0–34.0)
MCHC: 33.2 g/dL (ref 30.0–36.0)
MCV: 90.9 fL (ref 78.0–100.0)
Platelets: 187 10*3/uL (ref 150–400)
RBC: 3.84 MIL/uL — AB (ref 4.22–5.81)
RDW: 18 % — ABNORMAL HIGH (ref 11.5–15.5)
WBC: 20.1 10*3/uL — AB (ref 4.0–10.5)

## 2017-03-06 LAB — BASIC METABOLIC PANEL
ANION GAP: 12 (ref 5–15)
BUN: 50 mg/dL — ABNORMAL HIGH (ref 6–20)
CHLORIDE: 97 mmol/L — AB (ref 101–111)
CO2: 28 mmol/L (ref 22–32)
Calcium: 8.3 mg/dL — ABNORMAL LOW (ref 8.9–10.3)
Creatinine, Ser: 1.48 mg/dL — ABNORMAL HIGH (ref 0.61–1.24)
GFR calc Af Amer: 49 mL/min — ABNORMAL LOW (ref >60)
GFR calc non Af Amer: 43 mL/min — ABNORMAL LOW (ref >60)
GLUCOSE: 137 mg/dL — AB (ref 65–99)
POTASSIUM: 3.2 mmol/L — AB (ref 3.5–5.1)
Sodium: 137 mmol/L (ref 135–145)

## 2017-03-06 LAB — GLUCOSE, CAPILLARY
GLUCOSE-CAPILLARY: 384 mg/dL — AB (ref 65–99)
Glucose-Capillary: 138 mg/dL — ABNORMAL HIGH (ref 65–99)
Glucose-Capillary: 178 mg/dL — ABNORMAL HIGH (ref 65–99)
Glucose-Capillary: 335 mg/dL — ABNORMAL HIGH (ref 65–99)

## 2017-03-06 LAB — URINE CULTURE

## 2017-03-06 LAB — PROTIME-INR
INR: 2.97
Prothrombin Time: 31.5 seconds — ABNORMAL HIGH (ref 11.4–15.2)

## 2017-03-06 LAB — CREATININE, SERUM
Creatinine, Ser: 1.66 mg/dL — ABNORMAL HIGH (ref 0.61–1.24)
GFR calc Af Amer: 43 mL/min — ABNORMAL LOW (ref >60)
GFR calc non Af Amer: 37 mL/min — ABNORMAL LOW (ref >60)

## 2017-03-06 LAB — PATHOLOGIST SMEAR REVIEW

## 2017-03-06 MED ORDER — POTASSIUM CHLORIDE 10 MEQ/100ML IV SOLN
10.0000 meq | INTRAVENOUS | Status: AC
  Administered 2017-03-06 (×3): 10 meq via INTRAVENOUS
  Filled 2017-03-06 (×3): qty 100

## 2017-03-06 MED ORDER — WARFARIN SODIUM 1 MG PO TABS
1.0000 mg | ORAL_TABLET | Freq: Once | ORAL | Status: AC
  Administered 2017-03-06: 1 mg via ORAL
  Filled 2017-03-06: qty 1

## 2017-03-06 NOTE — Progress Notes (Signed)
[place] for warfarin Indication: hx atrial fibrillation  Allergies  Allergen Reactions  . Quinine Palpitations and Other (See Comments)    Seizure   . Uloric [Febuxostat] Rash    Patient Measurements: Weight: 247 lb 9.2 oz (112.3 kg) Heparin Dosing Weight:   Vital Signs: Temp: 97.3 F (36.3 C) (06/18 0830) Temp Source: Oral (06/18 0830) BP: 95/65 (06/18 0839) Pulse Rate: 87 (06/18 0839)  Labs:  Recent Labs  03/04/17 0250 03/04/17 1213 03/04/17 1821 03/05/17 0345 03/06/17 0303 03/06/17 0757  HGB 14.2  --   --  12.3*  --  11.6*  HCT 42.7  --   --  37.9*  --  34.9*  PLT 211  --   --  203  --  187  LABPROT 32.8*  --   --  36.8* 31.5*  --   INR 3.12  --   --  3.60 2.97  --   CREATININE 1.79*  --   --  1.67* 1.66* 1.48*  TROPONINI 0.03* 0.04* 0.03*  --   --   --     Estimated Creatinine Clearance: 52.2 mL/min (A) (by C-G formula based on SCr of 1.48 mg/dL (H)).   Medications:  Home warfarin regimen: 5 mg daily except 2.5 mg on MWF  Assessment: Patient's an 83 y.o M with hx CVA and afib on warfarin PTA presented to the ED on 03/04/17 s/p fall at Rehab facility with left forearm skin tear.  Warfarin resumed on admission.   Today, 03/06/2017: - INR now therapeutic at 2.97 with doses held for the past two days d/t supra-therapeutic INR - hgb and plt trending down - no bleeding documented - on dysphagia 3 diet - drug-drug intxns: being on abx (vanc and CTX) can make patient more sensitive to warfarin  Goal of Therapy:  INR 2-3 Monitor platelets by anticoagulation protocol: Yes   Plan:  - warfarin 1 mg PO x1 today - monitor for s/s bleeding  Vandora Jaskulski P 03/06/2017,9:58 AM

## 2017-03-06 NOTE — Progress Notes (Signed)
LCSW following for return to [place]. Authorization has been approved for ST rehab under Part A per Izora Gala with [facility]. Auth:  [id number] Authorization is good for 7 days. Patienthas used 58 days of his SNF benefit.  [employer] (insurance, [place] requesting goals of care meeting for patient if appropriate due to frequent admissions to inpatient and ED)  This can be done in the outpatient if patient ready before discharge or family not agreeable while in hospital.  Will continue to follow and assist with planning needs.  Lane Hacker, MSW Clinical Social Work: [employer] Coverage for :  [phone]

## 2017-03-06 NOTE — Clinical Social Work Note (Signed)
Clinical Social Work Assessment  Patient Details  Name: William Braun MRN: [id number] Date of Birth: January 02, 1933  Date of referral:  03/06/17               Reason for consult:  Discharge Planning                Permission sought to share information with:  Case Manager, Facility Sport and exercise psychologist, Family Supports Permission granted to share information::  Yes, Verbal Permission Granted  Name::        Agency::  [facility]  Relationship::  Wife, Engineer, maintenance (IT) Information:     Housing/Transportation Living arrangements for the past 2 months:  [place] of Information:  Patient, Medical Team, Tourist information centre manager, Spouse Patient Interpreter Needed:  None Criminal Activity/Legal Involvement Pertinent to Current Situation/Hospitalization:  No - Comment as needed Significant Relationships:  Other Family Members, [employer], Spouse Lives with:  Facility Resident Do you feel safe going back to the place where you live?  Yes Need for family participation in patient care:  No (Coment)  Care giving concerns:  Patient is a current resident at [facility].  Wife reports no caregiving concerns and wanting him to return.  Due to patient's admission and new PT/OT evaluations LCSW will attempt to get rehab days for patient through HTA medicare.  Facility is aware and in agreement along with wife.  If no HTA he can still return under LTC.  LCSW also reviewed in chart history that patient has [facility] social worker: Eduard Clos who was contacted and updated regarding current admission and status.  Family reports patient will be ready in next 1-2 days per MD report.   Social Worker assessment / plan:  LCSW completed assessment and FL2 for patient to return to SNF at discharge. Insurance company was contacted in effort to get prior auth for rehab. WIll continue to follow and assist with discharge planning and needs as they arise.  Plan: Back to [facility]  Employment status:   Retired Forensic scientist:  Programmer, applications, Medicaid In [place] PT Recommendations:  [place] / Referral to community resources:  [place]  Patient/Family's Response to care:  Wife is understanding and agreeable  Patient/Family's Understanding of and Emotional Response to Diagnosis, Current Treatment, and Prognosis:  Wife appreciative of care and able to verbalize current status of patient based on MD assessment this morning.  Reports she is agreeable for patient to return with no barriers at this time.  Emotional Assessment Appearance:  Appears stated age Attitude/Demeanor/Rapport:    Affect (typically observed):  Accepting, Adaptable, Pleasant Orientation:  Oriented to Self Alcohol / Substance use:  Not Applicable Psych involvement (Current and /or in the community):  No (Comment)  Discharge Needs  Concerns to be addressed:  No discharge needs identified Readmission within the last 30 days:  No Current discharge risk:  None Barriers to Discharge:  Continued Medical Work up   Marshell Garfinkel 03/06/2017, 12:33 PM

## 2017-03-06 NOTE — Care Management Note (Signed)
Case Management Note  Patient Details  Name: William Braun MRN: [id number] Date of Birth: [birth year]  Subjective/Objective:                  From [facility] 83 y.o. male with a past medical history of atrial fibrillation on anticoagulation with warfarin, history of COPD, history of OSA/OHS on BiPAP at night and O2 at daytime, chronic kidney disease stage III, history of gout on allopurinol, history of diabetes mellitus on insulin, history of chronic diastolic CHF with pulmonary hypertension, who lives in an assisted living facility. Patient apparently had a fall last night. He rolled out of his bed and the injured his left forearm resulting in a skin tear. He denies any head injuries. No headaches. While he was being evaluated in the emergency department, patient developed shortness of breath. A CT angiogram of the chest was done which did not show any PE. No interstitial edema was noted either. Patient denies any chest pain. Patient appears to be confused. His son arrived after my assessment. He tells me that for the past few days he has been getting more more confused. And apparently this occurs every time patient has an infection. Patient has also had a few falls in that home.. In the emergency department evaluation revealed a urinary tract infection, which could be the reason for his confusion. Patient is not a very good historian due to this confusion. So history is limited. Action/Plan: Date:  March 06, 2017 Chart reviewed for concurrent status and case management needs. Will continue to follow patient progress. Discharge Planning: following for needs Expected discharge date: [id number] Velva Harman, BSN, [place], [place]  Expected Discharge Date:                  Expected Discharge Plan:  Home/Self Care  In-House Referral:     Discharge planning Services  CM Consult  Post Acute Care Choice:    Choice offered to:     DME Arranged:    DME Agency:     HH Arranged:    HH  Agency:     Status of Service:  In process, will continue to follow  If discussed at Long Length of Stay Meetings, dates discussed:    Additional Comments:  Leeroy Cha, RN 03/06/2017, 8:51 AM

## 2017-03-06 NOTE — Progress Notes (Signed)
[facility]  PROGRESS NOTE  William Braun YQI:[id number] DOB: Jan 06, 1933 DOA: 03/04/2017 PCP: Hendricks Limes, MD   Brief HPI:    83 y.o. male with a past medical history of atrial fibrillation on anticoagulation with warfarin, history of COPD, history of OSA/OHS on BiPAP at night and O2 at daytime, chronic kidney disease stage III, history of gout on allopurinol, history of diabetes mellitus on insulin, history of chronic diastolic CHF with pulmonary hypertension, who lives in an assisted living facility. Patient apparently had a fall last night. He rolled out of his bed and the injured his left forearm resulting in a skin tear. He denies any head injuries. No headaches. While he was being evaluated in the emergency department, patient developed shortness of breath. A CT angiogram of the chest was done which did not show any PE. No interstitial edema was noted either. He was found to have abnormal UA and started on ceftriaxone   Subjective   This morning patient denies shortness of breath. No pain. Yesterday was started IV fluids due to hypotension. Hypotension seems to have resolved.   Assessment/Plan:     1. Sepsis due to UTI- Urine culture growingMethicillin-resistant staph aureus, 70,000 colonies per mL. Patient was started on ceftriaxone , added  vancomycin per pharmacy consultation.. Patient did get Demadex 60 mg yesterday morning and he became hypotensive. Demadex and Toprol-XL are currently on hold as patient's blood pressure was 77/45 yesterday . Started gentle IV hydration with normal saline at 100 ML per hour  . Will try to avoid fluid overload as patient does have history of congestive heart failure. Will discontinue ceftriaxone. 2. Fall resulting in skin tear of left forearm- left forearm in dressing, wound care consulted. Patient denies passing out. 3. Right wrist pain- improved after patient started on prednisone conjunctivae are prednisone 40 mg by mouth daily from this  morning 4. Chronic kidney disease stage III- patient's creatinine is 1.48,  at baseline. Will follow BMP in a.m. 5. Chronic diastolic CHF- does not appear to be in acute CHF at this time. Euvolemic. He was  started on IV fluids and is currently KVO. Will keep a close watch on intake and output. 6. Hypokalemia-potassium is 3.2 this morning, will replace potassium with KCl 10 mg IV 3 7. History of diabetes mellitus- continue Lantus, sliding scale insulin with NovoLog. Blood glucose 138 this morning. 8. Chronic hypoxic respiratory failure/OSA/OHS- patient is on BiPAP at night along with home oxygen. Continue BiPAP and oxygen in the hospital. 9. History of atrial fibrillation- patient is currently on warfarin. Continue warfarin per pharmacy consultation. 10. History of CLL- stable. Followed by oncology as outpatient.    DVT prophylaxis: Coumadin  Code Status: DO NOT RESUSCITATE  Family Communication: No family present at bedside   Disposition Plan: Pending improvement in sepsis   Consultants:  None  Procedures:  None  Continuous infusions . sodium chloride    . cefTRIAXone (ROCEPHIN)  IV 1 g (03/06/17 0558)  . sodium chloride    . vancomycin        Antibiotics:   Anti-infectives    Start     Dose/Rate Route Frequency Ordered Stop   03/06/17 1200  vancomycin (VANCOCIN) 1,250 mg in sodium chloride 0.9 % 250 mL IVPB     1,250 mg 166.7 mL/hr over 90 Minutes Intravenous Every 24 hours 03/05/17 1142     03/05/17 1200  vancomycin (VANCOCIN) 2,000 mg in sodium chloride 0.9 % 500 mL IVPB  2,000 mg 250 mL/hr over 120 Minutes Intravenous  Once 03/05/17 1142 03/05/17 1448   03/05/17 0600  cefTRIAXone (ROCEPHIN) 1 g in dextrose 5 % 50 mL IVPB     1 g 100 mL/hr over 30 Minutes Intravenous Every 24 hours 03/04/17 0853     03/04/17 0615  cefTRIAXone (ROCEPHIN) 1 g in dextrose 5 % 50 mL IVPB     1 g 100 mL/hr over 30 Minutes Intravenous  Once 03/04/17 0601 03/04/17 0855        Objective   Vitals:   03/06/17 0700 03/06/17 0830 03/06/17 0839 03/06/17 0936  BP: 97/69  95/65   Pulse: 84  87   Resp: (!) 21  15   Temp:  97.3 F (36.3 C)    TempSrc:  Oral    SpO2: 95%  96% 98%  Weight:        Intake/Output Summary (Last 24 hours) at 03/06/17 1144 Last data filed at 03/06/17 0800  Gross per 24 hour  Intake              675 ml  Output             1250 ml  Net             -575 ml   Filed Weights   03/05/17 3300  Weight: 112.3 kg (247 lb 9.2 oz)     Physical Examination:   Physical Exam: Eyes: No icterus, extraocular muscles intact  Mouth: Oral mucosa is moist, no lesions on palate,  Neck: Supple, no deformities, masses, or tenderness Lungs: Normal respiratory effort, bilateral clear to auscultation, no crackles or wheezes.  Heart: Regular rate and rhythm, S1 and S2 normal, no murmurs, rubs auscultated Abdomen: BS normoactive,soft,nondistended,non-tender to palpation,no organomegaly Extremities: No pretibial edema, no erythema, no cyanosis, no clubbing Neuro : Alert and oriented to time, place and person, No focal deficits Skin: No rashes seen on exam     Data Reviewed: I have personally reviewed following labs and imaging studies  CBG:  Recent Labs Lab 03/05/17 0732 03/05/17 1224 03/05/17 1629 03/05/17 2102 03/06/17 0838  GLUCAP 261* 369* 227* 202* 138*    CBC:  Recent Labs Lab 03/04/17 0250 03/05/17 0345 03/06/17 0757  WBC 20.7* 15.1* 20.1*  NEUTROABS 11.1*  --   --   HGB 14.2 12.3* 11.6*  HCT 42.7 37.9* 34.9*  MCV 93.2 91.8 90.9  PLT 211 203 762    Basic Metabolic Panel:  Recent Labs Lab 03/04/17 0250 03/05/17 0345 03/06/17 0303 03/06/17 0757  NA 140 136  --  137  K 4.6 4.3  --  3.2*  CL 94* 94*  --  97*  CO2 32 32  --  28  GLUCOSE 170* 320*  --  137*  BUN 46* 48*  --  50*  CREATININE 1.79* 1.67* 1.66* 1.48*  CALCIUM 9.1 8.5*  --  8.3*    Recent Results (from the past 240 hour(s))  Culture, blood  (routine x 2)     Status: None (Preliminary result)   Collection Time: 03/04/17  2:56 AM  Result Value Ref Range Status   Specimen Description BLOOD RIGHT ANTECUBITAL  Final   Special Requests   Final    BOTTLES DRAWN AEROBIC AND ANAEROBIC Blood Culture adequate volume   Culture   Final    NO GROWTH 1 DAY Performed at [facility], [address], [place], [place] [address]    Report Status PENDING  Incomplete  Culture, blood (routine x 2)     Status: None (Preliminary result)   Collection Time: 03/04/17  4:02 AM  Result Value Ref Range Status   Specimen Description BLOOD RIGHT ANTECUBITAL  Final   Special Requests   Final    BOTTLES DRAWN AEROBIC AND ANAEROBIC Blood Culture adequate volume   Culture   Final    NO GROWTH 1 DAY Performed at [facility], [place] [address], [place], [place] [address]    Report Status PENDING  Incomplete  Urine culture     Status: Abnormal   Collection Time: 03/04/17  4:51 AM  Result Value Ref Range Status   Specimen Description URINE, RANDOM  Final   Special Requests NONE  Final   Culture (A)  Final    70,000 COLONIES/mL METHICILLIN RESISTANT STAPHYLOCOCCUS AUREUS   Report Status 03/06/2017 FINAL  Final   Organism ID, Bacteria METHICILLIN RESISTANT STAPHYLOCOCCUS AUREUS (A)  Final      Susceptibility   Methicillin resistant staphylococcus aureus - MIC*    CIPROFLOXACIN >=8 RESISTANT Resistant     GENTAMICIN <=0.5 SENSITIVE Sensitive     NITROFURANTOIN <=16 SENSITIVE Sensitive     OXACILLIN >=4 RESISTANT Resistant     TETRACYCLINE <=1 SENSITIVE Sensitive     VANCOMYCIN <=0.5 SENSITIVE Sensitive     TRIMETH/SULFA <=10 SENSITIVE Sensitive     CLINDAMYCIN <=0.25 SENSITIVE Sensitive     RIFAMPIN <=0.5 SENSITIVE Sensitive     Inducible Clindamycin NEGATIVE Sensitive     * 70,000 COLONIES/mL METHICILLIN RESISTANT STAPHYLOCOCCUS AUREUS  MRSA PCR Screening     Status: None   Collection Time: 03/04/17  8:46 AM  Result Value Ref Range Status    MRSA by PCR NEGATIVE NEGATIVE Final    Comment:        The GeneXpert MRSA Assay (FDA approved for NASAL specimens only), is one component of a comprehensive MRSA colonization surveillance program. It is not intended to diagnose MRSA infection nor to guide or monitor treatment for MRSA infections.      Liver Function Tests:  Recent Labs Lab 03/04/17 0250  AST 22  ALT 10*  ALKPHOS 71  BILITOT 0.8  PROT 7.7  ALBUMIN 2.7*   No results for input(s): LIPASE, AMYLASE in the last 168 hours. No results for input(s): AMMONIA in the last 168 hours.  Cardiac Enzymes:  Recent Labs Lab 03/04/17 0250 03/04/17 1213 03/04/17 1821  TROPONINI 0.03* 0.04* 0.03*   BNP (last 3 results)  Recent Labs  11/14/16 0353 12/26/16 1449 12/28/16 1955  BNP 89.0 112.8* 141.7*    ProBNP (last 3 results) No results for input(s): PROBNP in the last 8760 hours.    Studies: No results found.  Scheduled Meds: . allopurinol  100 mg Oral Daily  . atorvastatin  10 mg Oral q1800  . divalproex  250 mg Oral BID  . insulin aspart  0-20 Units Subcutaneous TID WC  . insulin aspart  0-5 Units Subcutaneous QHS  . insulin glargine  10 Units Subcutaneous QHS  . mometasone-formoterol  2 puff Inhalation BID  . nystatin cream  1 application Topical BID  . polyethylene glycol  17 g Oral Daily  . predniSONE  40 mg Oral Q breakfast  . sodium chloride flush  3 mL Intravenous Q12H  . sodium chloride flush  3 mL Intravenous Q12H  . tiotropium  18 mcg Inhalation Daily  . warfarin  1 mg Oral ONCE-1800  . Warfarin - Pharmacist Dosing Inpatient   Does  not apply q1800      Time spent: 25 min  [facility] Pager [phone]. If 7PM-7AM, please contact night-coverage at [email or web address], Office  [phone]  password [email or web address] 03/06/2017, 11:44 AM  LOS: 2 days

## 2017-03-06 NOTE — NC FL2 (Signed)
[place] LEVEL OF CARE SCREENING TOOL     IDENTIFICATION  Patient Name: William Braun Birthdate: [birth year] Sex: male Admission Date (Current Location): 03/04/2017  [facility] and [place] Number:  Herbalist and Address:  [facility],  [place] [address], [place]      Provider Number: [id number]  Attending Physician Name and Address:  Oswald Hillock, MD  Relative Name and Phone Number:       Current Level of Care: Hospital Recommended Level of Care: [place] Prior Approval Number:    Date Approved/Denied:   PASRR Number: [id number]  Discharge Plan: SNF    Current Diagnoses: Patient Active Problem List   Diagnosis Date Noted  . Sepsis secondary to UTI ([place]) 03/04/2017  . Acute lower UTI 03/04/2017  . Gout 01/05/2017  . Sepsis ([place]) 12/29/2016  . Acute respiratory failure ([place]) 12/29/2016  . Malnutrition of moderate degree 12/29/2016  . Pain and swelling of right wrist 12/28/2016  . Chronic kidney disease (CKD), stage III (moderate) 11/17/2016  . Diabetic neuropathy ([place]) 11/17/2016  . Diabetic kidney ([place]) 11/17/2016  . Diabetes mellitus with polyneuropathy ([place]) 11/17/2016  . Acute on chronic diastolic CHF (congestive heart failure) ([place]) 10/31/2016  . Pressure injury of skin 10/30/2016  . Complicated UTI (urinary tract infection) 10/29/2016  . Acute on chronic respiratory failure with hypoxia ([place]) 09/14/2016  . Elevated troponin 09/14/2016  . Hypokalemia 09/14/2016  . Acute renal failure superimposed on stage 3 chronic kidney disease ([place]) 09/14/2016  . Toe ulcer, right ([place]) 02/02/2016  . CLL (chronic lymphocytic leukemia) ([place]) 11/17/2014  . Elevated WBCs 04/23/2014  . Other emphysema ([place]) 09/27/2013  . Dyspnea 08/13/2013  . 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 Chest pain, unspecified 08/03/2011  . Chronic diastolic CHF (congestive heart failure) ([place]) 05/11/2011  . Obesity hypoventilation syndrome  ([place]) 03/12/2010  . Combined systolic and diastolic heart failure ([place]) 03/12/2010  . COPD exacerbation ([place]) 03/12/2010  . Chronic respiratory failure ([place]) 02/19/2010  . HLD (hyperlipidemia) 12/03/2008  . Morbid obesity ([place]) 12/03/2008  . Essential hypertension 12/03/2008  . ATRIAL FIBRILLATION 12/03/2008  . PERIPHERAL EDEMA 12/03/2008    Orientation RESPIRATION BLADDER Height & Weight     Self, Situation, Place  O2 (3L) Incontinent, External catheter (currently while in hospital) Weight: 247 lb 9.2 oz (112.3 kg) Height:     BEHAVIORAL SYMPTOMS/MOOD NEUROLOGICAL BOWEL NUTRITION STATUS      Continent See DC summary  AMBULATORY STATUS COMMUNICATION OF NEEDS Skin   Extensive Assist Verbally Skin abrasions, Other (Comment) (skin tear/laceration and L finger wound)   numerous areas of dried serum/exchar on the toes and LEs, patient is followed by Dr. Earleen Newport at the [facility] (Podiatry).  Skin tear (Grade 3, avulsions) at the left arm resulting from fall. Skin tears (5) to left arm, Grade 3.  Full thickness avulsions. ED provider attempted to approximate skin edges.  Partial thickness areas and full thickness areas remain in a limb with ecchymosis and fragile tissues. Wound bed:red, moist                     Personal Care Assistance Level of Assistance  Bathing, Feeding, Dressing Bathing Assistance: Limited assistance Feeding assistance: Limited assistance Dressing Assistance: Limited assistance     Functional Limitations Info  Sight, Hearing, Speech Sight Info: Adequate Hearing Info: Adequate Speech Info: Adequate    SPECIAL CARE FACTORS FREQUENCY  PT (By licensed PT), OT (By licensed OT)  PT Frequency: 5x OT Frequency: 5x            Contractures Contractures Info: Not present    Additional Factors Info  Code Status, Isolation Precautions, Allergies Code Status Info: DNR Allergies Info: Quinine, Uloric Febuxostat     Isolation Precautions Info:  on contact while in hospital     Current Medications (03/06/2017):  This is the current hospital active medication list Current Facility-Administered Medications  Medication Dose Route Frequency Provider Last Rate Last Dose  . 0.9 %  sodium chloride infusion  250 mL Intravenous PRN Oswald Hillock, MD      . acetaminophen (TYLENOL) tablet 650 mg  650 mg Oral Q6H PRN Bonnielee Haff, MD       Or  . acetaminophen (TYLENOL) suppository 650 mg  650 mg Rectal Q6H PRN Bonnielee Haff, MD      . allopurinol (ZYLOPRIM) tablet 100 mg  100 mg Oral Daily Bonnielee Haff, MD   100 mg at 03/06/17 0911  . atorvastatin (LIPITOR) tablet 10 mg  10 mg Oral q1800 Bonnielee Haff, MD   10 mg at 03/05/17 1706  . divalproex (DEPAKOTE) DR tablet 250 mg  250 mg Oral BID Bonnielee Haff, MD   250 mg at 03/06/17 0911  . HYDROcodone-acetaminophen (NORCO/VICODIN) 5-325 MG per tablet 1 tablet  1 tablet Oral Q8H PRN Bonnielee Haff, MD   1 tablet at 03/04/17 1034  . insulin aspart (novoLOG) injection 0-20 Units  0-20 Units Subcutaneous TID WC Oswald Hillock, MD   4 Units at 03/06/17 1220  . insulin aspart (novoLOG) injection 0-5 Units  0-5 Units Subcutaneous QHS Oswald Hillock, MD   2 Units at 03/05/17 2242  . insulin glargine (LANTUS) injection 10 Units  10 Units Subcutaneous QHS Bonnielee Haff, MD   10 Units at 03/05/17 2245  . ipratropium-albuterol (DUONEB) 0.5-2.5 (3) MG/3ML nebulizer solution 3 mL  3 mL Nebulization Q6H PRN Bonnielee Haff, MD      . magnesium hydroxide (MILK OF MAGNESIA) suspension 30 mL  30 mL Oral Daily PRN Bonnielee Haff, MD      . mometasone-formoterol [facility]) 200-5 MCG/ACT inhaler 2 puff  2 puff Inhalation BID Bonnielee Haff, MD   2 puff at 03/06/17 0934  . nystatin cream (MYCOSTATIN) 1 application  1 application Topical BID Bonnielee Haff, MD   1 application at 63/01/60 2246  . ondansetron (ZOFRAN) tablet 4 mg  4 mg Oral Q6H PRN Bonnielee Haff, MD       Or  . ondansetron [facility]) injection 4  mg  4 mg Intravenous Q6H PRN Bonnielee Haff, MD      . polyethylene glycol (MIRALAX / GLYCOLAX) packet 17 g  17 g Oral Daily Bonnielee Haff, MD   17 g at 03/05/17 1042  . potassium chloride 10 mEq in 100 mL IVPB  10 mEq Intravenous Q1 Hr x 3 [place], Gagan S, MD      . predniSONE (DELTASONE) tablet 40 mg  40 mg Oral Q breakfast Oswald Hillock, MD   40 mg at 03/06/17 0911  . senna-docusate (Senokot-S) tablet 1 tablet  1 tablet Oral QHS PRN Bonnielee Haff, MD      . sodium chloride 0.9 % bolus 250 mL  250 mL Intravenous Once [place], Marge Duncans, MD      . sodium chloride flush (NS) 0.9 % injection 3 mL  3 mL Intravenous Q12H Bonnielee Haff, MD   3 mL at 03/06/17 0850  . sodium chloride flush (NS)  0.9 % injection 3 mL  3 mL Intravenous Q12H Bonnielee Haff, MD   3 mL at 03/06/17 0912  . sodium chloride flush (NS) 0.9 % injection 3 mL  3 mL Intravenous PRN Bonnielee Haff, MD      . tiotropium [facility]) inhalation capsule 18 mcg  18 mcg Inhalation Daily Bonnielee Haff, MD   18 mcg at 03/06/17 0933  . vancomycin (VANCOCIN) 1,250 mg in sodium chloride 0.9 % 250 mL IVPB  1,250 mg Intravenous Q24H Dara Hoyer, RPH 166.7 mL/hr at 03/06/17 1158 1,250 mg at 03/06/17 1158  . warfarin (COUMADIN) tablet 1 mg  1 mg Oral ONCE-1800 Pham, Anh P, RPH      . Warfarin - Pharmacist Dosing Inpatient   Does not apply q1800 Royetta Asal, [facility]   Stopped at 03/04/17 1800     Discharge Medications: Please see discharge summary for a list of discharge medications.  Relevant Imaging Results:  Relevant Lab Results:   Additional Information SS#: [id number]  [place]illy [place], LCSW

## 2017-03-06 NOTE — Consult Note (Signed)
[place] Nurse wound consult note Reason for Consult: numerous areas of dried serum/exchar on the toes and LEs, patient is followed by Dr. Earleen Newport at the [facility] (Podiatry).  Skin tear (Grade 3, avulsions) at the left arm resulting from fall. Wound type: PAD, also venous insufficiency, traumatic injury to left arm Pressure Injury POA: Yes/No Measurement: Skin tears (5) to left arm, Grade 3.  Full thickness avulsions. ED provider attempted to approximate skin edges.  Partial thickness areas and full thickness areas remain in a limb with ecchymosis and fragile tissues. Wound bed:red, moist Drainage (amount, consistency, odor) scant serous Periwound:ecchymotic Dressing procedure/placement/frequency: I will today implement a conservative POC that addresses moisture retentive dressings that do not adhere and are inexpensive and easy to perform.  We will use white petrolatum gauze twice daily and apply folded so that they are least likely to dry out in 12 hours. The numerus areas of dried serum and eschar on the LEs will be conservatively treated with betadine swabstick application twice daily to keep dry and infection-free in our acute care environment. Patient will follow up with Dr. Earleen Newport when he is discharged, according to wife. A pressure redistribution chair pad is provided for his comfort and to prevent a pressure injury while OOB in chair. [place] nursing team will not follow, but will remain available to this patient, the nursing and medical teams.  Please re-consult if needed. Thanks, Maudie Flakes, MSN, RN, [place], Arther Abbott  Pager# [phone]

## 2017-03-07 LAB — BASIC METABOLIC PANEL
Anion gap: 8 (ref 5–15)
BUN: 48 mg/dL — AB (ref 6–20)
CHLORIDE: 96 mmol/L — AB (ref 101–111)
CO2: 32 mmol/L (ref 22–32)
CREATININE: 1.44 mg/dL — AB (ref 0.61–1.24)
Calcium: 8.3 mg/dL — ABNORMAL LOW (ref 8.9–10.3)
GFR calc Af Amer: 51 mL/min — ABNORMAL LOW (ref >60)
GFR, EST NON AFRICAN AMERICAN: 44 mL/min — AB (ref >60)
Glucose, Bld: 242 mg/dL — ABNORMAL HIGH (ref 65–99)
Potassium: 4.2 mmol/L (ref 3.5–5.1)
SODIUM: 136 mmol/L (ref 135–145)

## 2017-03-07 LAB — GLUCOSE, CAPILLARY
GLUCOSE-CAPILLARY: 184 mg/dL — AB (ref 65–99)
Glucose-Capillary: 228 mg/dL — ABNORMAL HIGH (ref 65–99)
Glucose-Capillary: 346 mg/dL — ABNORMAL HIGH (ref 65–99)
Glucose-Capillary: 304 mg/dL — ABNORMAL HIGH (ref 65–99)

## 2017-03-07 LAB — PROTIME-INR
INR: 1.75
Prothrombin Time: 20.6 seconds — ABNORMAL HIGH (ref 11.4–15.2)

## 2017-03-07 MED ORDER — TORSEMIDE 20 MG PO TABS
60.0000 mg | ORAL_TABLET | Freq: Every day | ORAL | Status: DC
  Administered 2017-03-07 – 2017-03-09 (×3): 60 mg via ORAL
  Filled 2017-03-07 (×3): qty 3

## 2017-03-07 MED ORDER — PREDNISONE 20 MG PO TABS
30.0000 mg | ORAL_TABLET | Freq: Every day | ORAL | Status: DC
  Administered 2017-03-08 – 2017-03-09 (×2): 30 mg via ORAL
  Filled 2017-03-07 (×2): qty 1

## 2017-03-07 MED ORDER — WARFARIN SODIUM 2.5 MG PO TABS
2.5000 mg | ORAL_TABLET | Freq: Once | ORAL | Status: AC
  Administered 2017-03-07: 2.5 mg via ORAL
  Filled 2017-03-07: qty 1

## 2017-03-07 NOTE — Consult Note (Signed)
   [facility] CM Inpatient Consult   03/07/2017  William Braun Sep 10, 1933 [id number]     William Braun is active with [place] Management program.   He has been followed by [place] LCSW.   Please see chart review then encounter tab for further patient outreach details by [place] LCSW.  Chart reviewed. Noted palliative consult is pending.  Will continue to follow along and update [facility] LCSW.  Will make inpatient RNCM aware [facility] is active.   Marthenia Rolling, MSN-Ed, RN,BSN [facility] Liaison [phone]

## 2017-03-07 NOTE — Progress Notes (Signed)
[facility]  PROGRESS NOTE  William Braun QAS:[id number] DOB: April 10, 1933 DOA: 03/04/2017 PCP: Hendricks Limes, MD   Brief HPI:    83 y.o. male with a past medical history of atrial fibrillation on anticoagulation with warfarin, history of COPD, history of OSA/OHS on BiPAP at night and O2 at daytime, chronic kidney disease stage III, history of gout on allopurinol, history of diabetes mellitus on insulin, history of chronic diastolic CHF with pulmonary hypertension, who lives in an assisted living facility. Patient apparently had a fall last night. He rolled out of his bed and the injured his left forearm resulting in a skin tear. He denies any head injuries. No headaches. While he was being evaluated in the emergency department, patient developed shortness of breath. A CT angiogram of the chest was done which did not show any PE. No interstitial edema was noted either. He was found to have abnormal UA and started on ceftriaxone   Subjective   This morning patient denies shortness of breath, no nausea vomiting.    Assessment/Plan:     1. Sepsis due to UTI- Urine culture growingMethicillin-resistant staph aureus, 70,000 colonies per mL. Patient was started on ceftriaxone , added  vancomycin per pharmacy consultation.. Patient did get Demadex 60 mg and he became hypotensive. Demadex and Toprol-XL are currently on hold as patient's blood pressure was 77/45 yes . Started gentle IV hydration with normal saline at 100 ML per hour. At this moment sepsis has stabilized, patient is off IV fluids and blood pressure is 125/88. Patient can be switched to Bactrim at the time of discharge  2. Fall resulting in skin tear of left forearm- left forearm in dressing, wound cfollowingt denies passing out. 3. Right wrist pain- improved after patient started on  prednisone 40 mg by mouth daily . Will change prednisone to 30 mg daily from today. Can be discharged on prednisone taper when medically  ready. 4. Chronic kidney disease stage III- patient's creatinine is 1.44, slowly improving. Will follow BMP in a.m. 5. Chronic diastolic CHF- does not appear to be in acute CHF at this time. Euvolemic. He was  started on IV fluids and is currently KVO. patient was on high-dose Demadex 60 mg twice a day at home. I doubt patient needs high dose of Demadex as he has been without diuretics in the hospital and still euvolemic. I've consulted cardiology for further recommendations to adjust the dose of Demadex.  6. Hypokalemia- replete, potassium is 4.2 this morning. 7. History of diabetes mellitus- continue Lantus, sliding scale insulin with NovoLog. Blood glucose 138 this morning. 8. Chronic hypoxic respiratory failure/OSA/OHS- patient is on BiPAP at night along with home oxygen. Continue BiPAP and oxygen in the hospital. 9. History of atrial fibrillation- patient is currently on warfarin. Continue warfarin per pharmacy consultation. 10. History of CLL- stable. Followed by oncology as outpatient. 11. Leukocytosis -chronically elevated WBC, secondary to CLL.     DVT prophylaxis: Coumadin  Code Status: DO NOT RESUSCITATE  Family Communication: Discussed with patient's wife on phone, she would like to get palliative care consultation for goals of care. Patient has had  multiple admissions for past 3 months. We'll consult palliative care  Disposition Plan: Pending cardiology recommendation, palliative care consultation    Consultants:  None  Procedures:  None  Continuous infusions . sodium chloride    . sodium chloride    . vancomycin 1,250 mg (03/07/17 1119)      Antibiotics:   Anti-infectives    Start  Dose/Rate Route Frequency Ordered Stop   03/06/17 1200  vancomycin (VANCOCIN) 1,250 mg in sodium chloride 0.9 % 250 mL IVPB     1,250 mg 166.7 mL/hr over 90 Minutes Intravenous Every 24 hours 03/05/17 1142     03/05/17 1200  vancomycin (VANCOCIN) 2,000 mg in sodium chloride 0.9  % 500 mL IVPB     2,000 mg 250 mL/hr over 120 Minutes Intravenous  Once 03/05/17 1142 03/05/17 1448   03/05/17 0600  cefTRIAXone (ROCEPHIN) 1 g in dextrose 5 % 50 mL IVPB  Status:  Discontinued     1 g 100 mL/hr over 30 Minutes Intravenous Every 24 hours 03/04/17 0853 03/06/17 1148   03/04/17 0615  cefTRIAXone (ROCEPHIN) 1 g in dextrose 5 % 50 mL IVPB     1 g 100 mL/hr over 30 Minutes Intravenous  Once 03/04/17 0601 03/04/17 0855       Objective   Vitals:   03/07/17 0400 03/07/17 0500 03/07/17 0600 03/07/17 0800  BP: 117/81   125/88  Pulse: 74 72 (!) 55 (!) 56  Resp: 20 15 11 19   Temp:    97.3 F (36.3 C)  TempSrc:    Oral  SpO2: 99% 97% 98% 98%  Weight:        Intake/Output Summary (Last 24 hours) at 03/07/17 1200 Last data filed at 03/07/17 1000  Gross per 24 hour  Intake              640 ml  Output             2550 ml  Net            -1910 ml   Filed Weights   03/05/17 0607 03/07/17 0357  Weight: 112.3 kg (247 lb 9.2 oz) 117.5 kg (259 lb 0.7 oz)     Physical Examination:   Physical Exam: Eyes: No icterus, extraocular muscles intact  Mouth: Oral mucosa is moist, no lesions on palate,  Neck: Supple, no deformities, masses, or tenderness Lungs: Normal respiratory effort, bilateral clear to auscultation, no crackles or wheezes.  Heart: Regular rate and rhythm, S1 and S2 normal, no murmurs, rubs auscultated Abdomen: BS normoactive,soft,nondistended,non-tender to palpation,no organomegaly Extremities-Trace edema of the lower extremities no erythema, no cyanosis, no clubbing Neuro : Alert and oriented to time, place and person, No focal deficits Skin: No rashes seen on exam     Data Reviewed: I have personally reviewed following labs and imaging studies  CBG:  Recent Labs Lab 03/06/17 0838 03/06/17 1213 03/06/17 1714 03/06/17 2135 03/07/17 0756  GLUCAP 138* 178* 384* 335* 184*    CBC:  Recent Labs Lab 03/04/17 0250 03/05/17 0345 03/06/17 0757   WBC 20.7* 15.1* 20.1*  NEUTROABS 11.1*  --   --   HGB 14.2 12.3* 11.6*  HCT 42.7 37.9* 34.9*  MCV 93.2 91.8 90.9  PLT 211 203 681    Basic Metabolic Panel:  Recent Labs Lab 03/04/17 0250 03/05/17 0345 03/06/17 0303 03/06/17 0757 03/07/17 0338  NA 140 136  --  137 136  K 4.6 4.3  --  3.2* 4.2  CL 94* 94*  --  97* 96*  CO2 32 32  --  28 32  GLUCOSE 170* 320*  --  137* 242*  BUN 46* 48*  --  50* 48*  CREATININE 1.79* 1.67* 1.66* 1.48* 1.44*  CALCIUM 9.1 8.5*  --  8.3* 8.3*    Recent Results (from the past 240 hour(s))  Culture, blood (routine x  2)     Status: None (Preliminary result)   Collection Time: 03/04/17  2:56 AM  Result Value Ref Range Status   Specimen Description BLOOD RIGHT ANTECUBITAL  Final   Special Requests   Final    BOTTLES DRAWN AEROBIC AND ANAEROBIC Blood Culture adequate volume   Culture   Final    NO GROWTH 2 DAYS Performed at [facility], [place] [address], [place], [place] [address]    Report Status PENDING  Incomplete  Culture, blood (routine x 2)     Status: None (Preliminary result)   Collection Time: 03/04/17  4:02 AM  Result Value Ref Range Status   Specimen Description BLOOD RIGHT ANTECUBITAL  Final   Special Requests   Final    BOTTLES DRAWN AEROBIC AND ANAEROBIC Blood Culture adequate volume   Culture   Final    NO GROWTH 2 DAYS Performed at [facility], [place] [address], [place], [place] [address]    Report Status PENDING  Incomplete  Urine culture     Status: Abnormal   Collection Time: 03/04/17  4:51 AM  Result Value Ref Range Status   Specimen Description URINE, RANDOM  Final   Special Requests NONE  Final   Culture (A)  Final    70,000 COLONIES/mL METHICILLIN RESISTANT STAPHYLOCOCCUS AUREUS   Report Status 03/06/2017 FINAL  Final   Organism ID, Bacteria METHICILLIN RESISTANT STAPHYLOCOCCUS AUREUS (A)  Final      Susceptibility   Methicillin resistant staphylococcus aureus - MIC*    CIPROFLOXACIN >=8 RESISTANT  Resistant     GENTAMICIN <=0.5 SENSITIVE Sensitive     NITROFURANTOIN <=16 SENSITIVE Sensitive     OXACILLIN >=4 RESISTANT Resistant     TETRACYCLINE <=1 SENSITIVE Sensitive     VANCOMYCIN <=0.5 SENSITIVE Sensitive     TRIMETH/SULFA <=10 SENSITIVE Sensitive     CLINDAMYCIN <=0.25 SENSITIVE Sensitive     RIFAMPIN <=0.5 SENSITIVE Sensitive     Inducible Clindamycin NEGATIVE Sensitive     * 70,000 COLONIES/mL METHICILLIN RESISTANT STAPHYLOCOCCUS AUREUS  MRSA PCR Screening     Status: None   Collection Time: 03/04/17  8:46 AM  Result Value Ref Range Status   MRSA by PCR NEGATIVE NEGATIVE Final    Comment:        The GeneXpert MRSA Assay (FDA approved for NASAL specimens only), is one component of a comprehensive MRSA colonization surveillance program. It is not intended to diagnose MRSA infection nor to guide or monitor treatment for MRSA infections.      Liver Function Tests:  Recent Labs Lab 03/04/17 0250  AST 22  ALT 10*  ALKPHOS 71  BILITOT 0.8  PROT 7.7  ALBUMIN 2.7*   No results for input(s): LIPASE, AMYLASE in the last 168 hours. No results for input(s): AMMONIA in the last 168 hours.  Cardiac Enzymes:  Recent Labs Lab 03/04/17 0250 03/04/17 1213 03/04/17 1821  TROPONINI 0.03* 0.04* 0.03*   BNP (last 3 results)  Recent Labs  11/14/16 0353 12/26/16 1449 12/28/16 1955  BNP 89.0 112.8* 141.7*    ProBNP (last 3 results) No results for input(s): PROBNP in the last 8760 hours.    Studies: No results found.  Scheduled Meds: . allopurinol  100 mg Oral Daily  . atorvastatin  10 mg Oral q1800  . divalproex  250 mg Oral BID  . insulin aspart  0-20 Units Subcutaneous TID WC  . insulin aspart  0-5 Units Subcutaneous QHS  . insulin glargine  10 Units Subcutaneous QHS  . mometasone-formoterol  2 puff Inhalation BID  . nystatin cream  1 application Topical BID  . polyethylene glycol  17 g Oral Daily  . predniSONE  40 mg Oral Q breakfast  . sodium  chloride flush  3 mL Intravenous Q12H  . sodium chloride flush  3 mL Intravenous Q12H  . tiotropium  18 mcg Inhalation Daily  . warfarin  2.5 mg Oral ONCE-1800  . Warfarin - Pharmacist Dosing Inpatient   Does not apply q1800      Time spent: 25 min  [facility] Pager [phone]. If 7PM-7AM, please contact night-coverage at [email or web address], Office  [phone]  password [email or web address] 03/07/2017, 12:00 PM  LOS: 3 days            This morning patient denies shortness of breath. No nausea or vomiting.

## 2017-03-07 NOTE — Consult Note (Signed)
Cardiology Consultation:   Patient ID: William Braun; [id number]; 1933/06/15   Admit date: 03/04/2017 Date of Consult: 03/07/2017  Primary Care Provider: Hendricks Limes, MD Primary Cardiologist: Aundra Dubin    Patient Profile:   William Braun is a 83 y.o. male with a hx of Chronic diastolic heart failure and pulmonary hypertension who is being seen today for the evaluation of shortness of breath/Demadex dose adjustment at the request of Dr. Darrick Meigs.  History of Present Illness:   William Braun is an 83 year old male known to Dr. Aundra Dubin in the advanced heart failure clinic with chronic diastolic heart failure, pulmonary hypertension, atrial fibrillation permanent, obesity hypoventilation syndrome who has been on Demadex 60 mg twice a day. He resides in a skilled nursing facility. He came into the hospital after sustaining a fall resulting in a tear of his skin. His blood pressure was low and his urine was abnormal. He is being treated for UTI/sepsis.  His Demadex has currently been held and we are being consulted to question his dose.  Last night he was on BiPAP as he is instructed to use at home. He was previously admitted in 2017 December with COPD exacerbation and left lower lobe pneumonia and then in February again with UTI/encephalopathy. Torsemide at that time was decreased to 40 mg twice a day.  He lives at [facility]  He is wheelchair bound mostly. Chronically short of breath with 10 feet of ambulation. No syncope, no bleeding, no melena.  Interestingly, his blood pressure at last clinic visit on 02/24/17 was 90/58.  Past Medical History:  Diagnosis Date  . Atrial fibrillation ([place])   . Cerebrovascular disease   . CHF (congestive heart failure) ([place])   . CLL (chronic lymphocytic leukemia) ([place]) 11/17/2014  . COPD (chronic obstructive pulmonary disease) ([place])   . History of thrombocytopenia   . Obesity   . Osteoarthritis   . Other and unspecified hyperlipidemia   . Type II or  unspecified type diabetes mellitus without mention of complication, not stated as uncontrolled   . Unspecified essential hypertension     Past Surgical History:  Procedure Laterality Date  . KNEE ARTHROSCOPY W/ ALLOGRAFT IMPANT    . VASECTOMY       Inpatient Medications: Scheduled Meds: . allopurinol  100 mg Oral Daily  . atorvastatin  10 mg Oral q1800  . divalproex  250 mg Oral BID  . insulin aspart  0-20 Units Subcutaneous TID WC  . insulin aspart  0-5 Units Subcutaneous QHS  . insulin glargine  10 Units Subcutaneous QHS  . mometasone-formoterol  2 puff Inhalation BID  . nystatin cream  1 application Topical BID  . polyethylene glycol  17 g Oral Daily  . [START ON 03/08/2017] predniSONE  30 mg Oral Q breakfast  . sodium chloride flush  3 mL Intravenous Q12H  . sodium chloride flush  3 mL Intravenous Q12H  . tiotropium  18 mcg Inhalation Daily  . warfarin  2.5 mg Oral ONCE-1800  . Warfarin - Pharmacist Dosing Inpatient   Does not apply q1800   Continuous Infusions: . sodium chloride    . sodium chloride    . vancomycin Stopped (03/07/17 1249)   PRN Meds: sodium chloride, acetaminophen **OR** acetaminophen, HYDROcodone-acetaminophen, ipratropium-albuterol, magnesium hydroxide, ondansetron **OR** ondansetron (ZOFRAN) IV, senna-docusate, sodium chloride flush  Allergies:    Allergies  Allergen Reactions  . Quinine Palpitations and Other (See Comments)    Seizure   . Uloric [Febuxostat] Rash    Social  History:   Social History   Social History  . Marital status: Married    Spouse name: N/A  . Number of children: N/A  . Years of education: N/A   Occupational History  . Retired    Social History Main Topics  . Smoking status: Former Smoker    Packs/day: 2.00    Years: 34.00    Types: Cigarettes    Start date: 04/23/1946    Quit date: 09/19/1978  . Smokeless tobacco: Never Used     Comment: quit 35 years ago  . Alcohol use No  . Drug use: Unknown  . Sexual  activity: Not Currently   Other Topics Concern  . Not on file   Social History Narrative  . No narrative on file    Family History:   The patient's family history includes Heart disease in his father.  ROS:  Please see the history of present illness.  ROS  All other ROS reviewed and negative.     Physical Exam/Data:   Vitals:   03/07/17 0600 03/07/17 0800 03/07/17 1200 03/07/17 1228  BP:  125/88  97/68  Pulse: (!) 55 (!) 56 86 81  Resp: 11 19 (!) 32 (!) 30  Temp:  97.3 F (36.3 C) 97.7 F (36.5 C)   TempSrc:  Oral Oral   SpO2: 98% 98% 99% 97%  Weight:        Intake/Output Summary (Last 24 hours) at 03/07/17 1419 Last data filed at 03/07/17 1000  Gross per 24 hour  Intake              520 ml  Output             2550 ml  Net            -2030 ml   Filed Weights   03/05/17 0607 03/07/17 0357  Weight: 247 lb 9.2 oz (112.3 kg) 259 lb 0.7 oz (117.5 kg)   Body mass index is 33.26 kg/m.  General:  Obese, no significant distress HEENT: normal Lymph: no adenopathy Neck: no JVD Endocrine:  No thryomegaly Vascular: No carotid bruits; FA pulses 2+ bilaterally without bruits  Cardiac:  normal S1, S2; RRR; 1/6 systolic ejection murmur right upper sternal border Lungs:  clear to auscultation bilaterally, no wheezing, rhonchi or rales  Abd: soft, nontender, no hepatomegaly  Ext: Trace edema lower extremities Musculoskeletal:  Weakness generally. Skin: Multiple ecchymoses lower extremities with hyperpigmentation from chronic edema  Neuro:  CNs 2-12 intact, no focal abnormalities noted Psych:  Normal affect    EKG:  The EKG was personally reviewed and demonstrates a fibrillation with right bundle branch block, normal heart rate on 03/05/17  Relevant CV Studies:  Echocardiogram 09/19/16 - Left ventricle: The cavity size was normal. Wall thickness was   increased in a pattern of moderate LVH. Systolic function was   normal. The estimated ejection fraction was in the range of  55%   to 60%. Although no diagnostic regional wall motion abnormality   was identified, this possibility cannot be completely excluded on   the basis of this study. - Aortic valve: Valve mobility was mildly restricted. Transvalvular   velocity was increased. There was mild stenosis. - Aortic root: The aortic root was mildly dilated. - Mitral valve: Moderately calcified annulus. Mildly calcified   leaflets . - Left atrium: The atrium was mildly to moderately dilated. - Right atrium: The atrium was mildly to moderately dilated. - Tricuspid valve: There was mild-moderate regurgitation. -  Pulmonary arteries: Systolic pressure was moderately increased.   PA peak pressure: 56 mm Hg (S).  Laboratory Data:  Chemistry Recent Labs Lab 03/05/17 0345 03/06/17 0303 03/06/17 0757 03/07/17 0338  NA 136  --  137 136  K 4.3  --  3.2* 4.2  CL 94*  --  97* 96*  CO2 32  --  28 32  GLUCOSE 320*  --  137* 242*  BUN 48*  --  50* 48*  CREATININE 1.67* 1.66* 1.48* 1.44*  CALCIUM 8.5*  --  8.3* 8.3*  GFRNONAA 37* 37* 43* 44*  GFRAA 43* 43* 49* 51*  ANIONGAP 10  --  12 8     Recent Labs Lab 03/04/17 0250  PROT 7.7  ALBUMIN 2.7*  AST 22  ALT 10*  ALKPHOS 71  BILITOT 0.8   Hematology Recent Labs Lab 03/04/17 0250 03/05/17 0345 03/06/17 0757  WBC 20.7* 15.1* 20.1*  RBC 4.58 4.13* 3.84*  HGB 14.2 12.3* 11.6*  HCT 42.7 37.9* 34.9*  MCV 93.2 91.8 90.9  MCH 31.0 29.8 30.2  MCHC 33.3 32.5 33.2  RDW 18.3* 17.8* 18.0*  PLT 211 203 187   Cardiac Enzymes Recent Labs Lab 03/04/17 0250 03/04/17 1213 03/04/17 1821  TROPONINI 0.03* 0.04* 0.03*   No results for input(s): TROPIPOC in the last 168 hours.  BNPNo results for input(s): BNP, PROBNP in the last 168 hours.  DDimer No results for input(s): DDIMER in the last 168 hours.  Radiology/Studies:  Dg Wrist 2 Views Right  Result Date: 03/04/2017 CLINICAL DATA:  Pain in right wrist due to gout per pt ; no recent injury; pt id checked  with RN EXAM: RIGHT WRIST - 2 VIEW COMPARISON:  12/29/2016, 12/30/2016 MRI FINDINGS: There is no acute fracture or dislocation. There are well corticated erosions in the interphalangeal joint of the thumb and the metacarpal phalangeal joints, consistent with the history of gout. Bone mineral density is normal. There is atherosclerotic calcification of the small arteries. Soft tissue swelling of the thumb. Joint space narrowing and corticated erosions of the first carpometacarpal joint. IMPRESSION: 1.  No evidence for acute  abnormality. 2. Findings are compatible with gout arthropathy and appear stable. Electronically Signed   By: Nolon Nations M.D.   On: 03/04/2017 11:21   Ct Angio Chest Pe W/cm &/or Wo Cm  Result Date: 03/04/2017 CLINICAL DATA:  Acute onset of generalized chest pain and shortness of breath. Tachycardia. Decreased O2 saturation. Initial encounter. EXAM: CT ANGIOGRAPHY CHEST WITH CONTRAST TECHNIQUE: Multidetector CT imaging of the chest was performed using the standard protocol during bolus administration of intravenous contrast. Multiplanar CT image reconstructions and MIPs were obtained to evaluate the vascular anatomy. CONTRAST:  80 mL of Isovue 370 IV contrast COMPARISON:  Chest radiograph performed earlier today at 2:33 a.m., and CT of the chest performed 09/19/2016 FINDINGS: Cardiovascular:  There is no evidence of pulmonary embolus. The heart is borderline normal in size. Diffuse coronary artery calcifications are seen. Scattered calcification is noted along the thoracic aorta and proximal great vessels. Mediastinum/Nodes: Scattered enlarged mediastinal and left hilar nodes are seen, measuring up to 1.9 cm in short axis, particularly at the right paratracheal region and azygoesophageal recess. No pericardial effusion is identified. The thyroid gland is unremarkable. No axillary lymphadenopathy is appreciated. Lungs/Pleura: Mild scattered atelectasis is noted bilaterally. The lungs are  difficult to fully assess due to motion artifact. No pleural effusion or pneumothorax is seen. No masses are identified. Upper Abdomen: The visualized portions  of the liver and spleen are grossly unremarkable. Stones are noted dependently within the gallbladder. The gallbladder is mildly distended but otherwise unremarkable. The visualized portions of the pancreas and adrenal glands are within normal limits. Nonspecific perinephric stranding is noted bilaterally. Musculoskeletal: No acute osseous abnormalities are identified. The visualized musculature is unremarkable in appearance. Review of the MIP images confirms the above findings. IMPRESSION: 1. No evidence of pulmonary embolus. 2. Mild scattered atelectasis noted bilaterally. 3. Enlarged mediastinal and left hilar nodes, measuring up to 1.9 cm in short axis. These are persistent from the prior study, and could reflect underlying lymphoma or inflammatory disease. Several nodes would be amenable to biopsy, as deemed clinically appropriate. 4. Diffuse coronary artery calcifications seen. 5. Cholelithiasis. Gallbladder mildly distended but otherwise unremarkable. Electronically Signed   By: Garald Balding M.D.   On: 03/04/2017 05:24   Dg Chest Portable 1 View  Result Date: 03/04/2017 CLINICAL DATA:  Status post fall out of bed, with shortness of breath and right shoulder pain. Initial encounter. EXAM: PORTABLE CHEST 1 VIEW COMPARISON:  Chest radiograph performed 01/02/2017 FINDINGS: There is mild elevation of the right hemidiaphragm. Vascular congestion is noted. Mild interstitial prominence may reflect transient interstitial edema. No pneumothorax is seen. The cardiomediastinal silhouette is normal in size. No acute osseous abnormalities are identified. IMPRESSION: Mild elevation of the right hemidiaphragm. Vascular congestion noted. Mild interstitial prominence may reflect transient interstitial edema. No displaced rib fracture seen. Electronically Signed    By: Garald Balding M.D.   On: 03/04/2017 02:58    Assessment and Plan:   Chronic diastolic heart failure with pulmonary hypertension component  - Was on torsemide 60 mg twice a day at home as prescribed by Dr. Aundra Dubin, heart failure clinic. Was doing fairly well on this, stable  - I agree that with his current hypotension, septic episode, it seems reasonable to trial him on a reduction in dose, 60 mg once a day of torsemide. Of course, if he begins to illustrate worsening shortness of breath or evidence of increasing edema, he may need to go back on his previous dose of 60 mg twice a day. I discussed this with he and his family. He is already under strict fluid restrictions at [facility].  Sepsis due to UTI  - Per primary team. Getting antibiotics ceftriaxone and vancomycin.  Chronic kidney disease stage III  - Creatinine in the 1.4 range. Stable.  Diabetes with hypertension  - Continue with current regimen.  We will go ahead and sign off. Please let us know free to be of further assistance.   Signed, Candee Furbish, MD  03/07/2017 2:19 PM

## 2017-03-07 NOTE — Progress Notes (Signed)
[place] for warfarin Indication: hx atrial fibrillation  Allergies  Allergen Reactions  . Quinine Palpitations and Other (See Comments)    Seizure   . Uloric [Febuxostat] Rash    Patient Measurements: Weight: 259 lb 0.7 oz (117.5 kg) Heparin Dosing Weight:   Vital Signs: Temp: 97.3 F (36.3 C) (06/19 0800) Temp Source: Oral (06/19 0800) BP: 125/88 (06/19 0800) Pulse Rate: 56 (06/19 0800)  Labs:  Recent Labs  03/04/17 1213 03/04/17 1821  03/05/17 0345 03/06/17 0303 03/06/17 0757 03/07/17 0338  HGB  --   --   --  12.3*  --  11.6*  --   HCT  --   --   --  37.9*  --  34.9*  --   PLT  --   --   --  203  --  187  --   LABPROT  --   --   --  36.8* 31.5*  --  20.6*  INR  --   --   --  3.60 2.97  --  1.75  CREATININE  --   --   < > 1.67* 1.66* 1.48* 1.44*  TROPONINI 0.04* 0.03*  --   --   --   --   --   < > = values in this interval not displayed.  Estimated Creatinine Clearance: 54.8 mL/min (A) (by C-G formula based on SCr of 1.44 mg/dL (H)).   Medications:  Home warfarin regimen: 5 mg daily except 2.5 mg on MWF  Assessment: Patient's an 83 y.o M with hx CVA and afib on warfarin PTA presented to the ED on 03/04/17 [facility]p fall [facility] with left forearm skin tear.  Warfarin resumed on admission.   Today, 03/07/2017: - INR now subtherapeutic due to doses held prior to restart of low dose of 1mg  yesterday PM - hgb and plt trending down slightly - no bleeding documented - on dysphagia 3 diet - drug-drug intxns: being on abx (vanc and CTX) can make patient more sensitive to warfarin  Goal of Therapy:  INR 2-3 Monitor platelets by anticoagulation protocol: Yes   Plan:  1) Increase warfarin to 2.5mg  today 2) Monitor for s/s bleeding 3) Daily INR   Adrian Saran, PharmD, BCPS Pager [phone] 03/07/2017 9:44 AM

## 2017-03-07 NOTE — Progress Notes (Signed)
No charge note  PMT meeting scheduled with wife on 6/20 at 10:30 am.   Imogene Burn, [place] [facility] Pager: [phone]

## 2017-03-08 ENCOUNTER — Ambulatory Visit: Payer: Self-pay | Admitting: *Deleted

## 2017-03-08 DIAGNOSIS — Z7189 Other specified counseling: Secondary | ICD-10-CM

## 2017-03-08 DIAGNOSIS — J9611 Chronic respiratory failure with hypoxia: Secondary | ICD-10-CM

## 2017-03-08 DIAGNOSIS — N390 Urinary tract infection, site not specified: Secondary | ICD-10-CM

## 2017-03-08 DIAGNOSIS — I5032 Chronic diastolic (congestive) heart failure: Secondary | ICD-10-CM

## 2017-03-08 DIAGNOSIS — E662 Morbid (severe) obesity with alveolar hypoventilation: Secondary | ICD-10-CM

## 2017-03-08 DIAGNOSIS — I482 Chronic atrial fibrillation: Secondary | ICD-10-CM

## 2017-03-08 DIAGNOSIS — R451 Restlessness and agitation: Secondary | ICD-10-CM

## 2017-03-08 DIAGNOSIS — E1142 Type 2 diabetes mellitus with diabetic polyneuropathy: Secondary | ICD-10-CM

## 2017-03-08 DIAGNOSIS — A419 Sepsis, unspecified organism: Principal | ICD-10-CM

## 2017-03-08 DIAGNOSIS — C9190 Lymphoid leukemia, unspecified not having achieved remission: Secondary | ICD-10-CM

## 2017-03-08 DIAGNOSIS — R52 Pain, unspecified: Secondary | ICD-10-CM

## 2017-03-08 DIAGNOSIS — R0602 Shortness of breath: Secondary | ICD-10-CM

## 2017-03-08 DIAGNOSIS — N3000 Acute cystitis without hematuria: Secondary | ICD-10-CM

## 2017-03-08 DIAGNOSIS — Z515 Encounter for palliative care: Secondary | ICD-10-CM

## 2017-03-08 LAB — COMPREHENSIVE METABOLIC PANEL
ALK PHOS: 63 U/L (ref 38–126)
ALT: 33 U/L (ref 17–63)
ANION GAP: 7 (ref 5–15)
AST: 41 U/L (ref 15–41)
Albumin: 2.6 g/dL — ABNORMAL LOW (ref 3.5–5.0)
BUN: 40 mg/dL — ABNORMAL HIGH (ref 6–20)
CALCIUM: 8.5 mg/dL — AB (ref 8.9–10.3)
CO2: 38 mmol/L — ABNORMAL HIGH (ref 22–32)
CREATININE: 1.29 mg/dL — AB (ref 0.61–1.24)
Chloride: 94 mmol/L — ABNORMAL LOW (ref 101–111)
GFR, EST AFRICAN AMERICAN: 58 mL/min — AB (ref >60)
GFR, EST NON AFRICAN AMERICAN: 50 mL/min — AB (ref >60)
Glucose, Bld: 237 mg/dL — ABNORMAL HIGH (ref 65–99)
Potassium: 3.1 mmol/L — ABNORMAL LOW (ref 3.5–5.1)
Sodium: 139 mmol/L (ref 135–145)
Total Bilirubin: 0.8 mg/dL (ref 0.3–1.2)
Total Protein: 6.6 g/dL (ref 6.5–8.1)

## 2017-03-08 LAB — PROTIME-INR
INR: 1.4
PROTHROMBIN TIME: 17.3 s — AB (ref 11.4–15.2)

## 2017-03-08 LAB — GLUCOSE, CAPILLARY
GLUCOSE-CAPILLARY: 189 mg/dL — AB (ref 65–99)
GLUCOSE-CAPILLARY: 235 mg/dL — AB (ref 65–99)
Glucose-Capillary: 322 mg/dL — ABNORMAL HIGH (ref 65–99)
Glucose-Capillary: 346 mg/dL — ABNORMAL HIGH (ref 65–99)

## 2017-03-08 LAB — CBC WITH DIFFERENTIAL/PLATELET
BASOS PCT: 0 %
Basophils Absolute: 0 10*3/uL (ref 0.0–0.1)
EOS ABS: 0 10*3/uL (ref 0.0–0.7)
EOS PCT: 0 %
HCT: 39.1 % (ref 39.0–52.0)
HEMOGLOBIN: 13 g/dL (ref 13.0–17.0)
Lymphocytes Relative: 36 %
Lymphs Abs: 7.6 10*3/uL — ABNORMAL HIGH (ref 0.7–4.0)
MCH: 30.3 pg (ref 26.0–34.0)
MCHC: 33.2 g/dL (ref 30.0–36.0)
MCV: 91.1 fL (ref 78.0–100.0)
MONO ABS: 1.7 10*3/uL — AB (ref 0.1–1.0)
Monocytes Relative: 8 %
NEUTROS ABS: 11.8 10*3/uL — AB (ref 1.7–7.7)
NEUTROS PCT: 56 %
PLATELETS: 188 10*3/uL (ref 150–400)
RBC: 4.29 MIL/uL (ref 4.22–5.81)
RDW: 17.6 % — ABNORMAL HIGH (ref 11.5–15.5)
WBC: 21.1 10*3/uL — ABNORMAL HIGH (ref 4.0–10.5)

## 2017-03-08 LAB — CREATININE, SERUM
CREATININE: 1.37 mg/dL — AB (ref 0.61–1.24)
GFR calc Af Amer: 54 mL/min — ABNORMAL LOW (ref >60)
GFR, EST NON AFRICAN AMERICAN: 47 mL/min — AB (ref >60)

## 2017-03-08 LAB — MAGNESIUM: Magnesium: 2.1 mg/dL (ref 1.7–2.4)

## 2017-03-08 LAB — PHOSPHORUS: PHOSPHORUS: 2.5 mg/dL (ref 2.5–4.6)

## 2017-03-08 MED ORDER — WARFARIN SODIUM 5 MG PO TABS
5.0000 mg | ORAL_TABLET | Freq: Once | ORAL | Status: AC
  Administered 2017-03-08: 5 mg via ORAL
  Filled 2017-03-08: qty 1

## 2017-03-08 MED ORDER — POTASSIUM CHLORIDE CRYS ER 20 MEQ PO TBCR
40.0000 meq | EXTENDED_RELEASE_TABLET | Freq: Two times a day (BID) | ORAL | Status: DC
  Administered 2017-03-08 – 2017-03-09 (×2): 40 meq via ORAL
  Filled 2017-03-08 (×2): qty 2

## 2017-03-08 NOTE — Consult Note (Signed)
Consultation Note Date: 03/08/2017   Patient Name: William Braun  DOB: May 23, 1933  MRN: [id number]  Age / Sex: 83 y.o., male  PCP: Hendricks Limes, MD Referring Physician: Kerney Elbe, DO  Reason for Consultation: Establishing goals of care  HPI/Patient Profile: 84 y.o. male  with past medical history of CHF-D, pulmonary HTN, CLL, CKD 3, Afib with RVR and gouty arthritis in the right wrist who was admitted on 03/04/2017 with sepsis and acute on chronic kidney failure.  Sepsis was thought to be from UTI.  Urine culture has grown 70k colonies of MRSA. He has been seen by cardiology as well for consideration of reducing his demedex dosage.  Clinical Assessment and Goals of Care:  I have reviewed medical records including EPIC notes, labs and imaging, received report from the care team, assessed the patient and then met at the bedside along with his wife William Braun, son William Braun, and second son William Braun on the phone  to discuss diagnosis prognosis, [place], EOL wishes, disposition and options.  I introduced Palliative Medicine as specialized medical care for people living with serious illness. It focuses on providing relief from the symptoms and stress of a serious illness. The goal is to improve quality of life for both the patient and the family.  We discussed a brief life review of the patient.  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  He is married to William Braun.  She is his second wife and their relationship is very close.  Per William Braun and William Braun the patient has not been able to walk more than a few feet for several weeks.  He has fallen out of bed 3x recently at [facility] and his appetite is dropping.  We discussed his lung disease, heart disease, CLL, DM, recurrent infections (UTI), and chronic CKD disease.  The patient and family understand that most all of these are chronic and will slowly  get worse.  The patient still has hopes to walk again.  His wish is to go to his son' s house to go fishing and have a fish fry.  He is unhappy at SNF - commenting that it feels like prison.  He is very frustrated by his gradually worsening ability to function.  He is sad and anxious about facing end of life in the coming months.  The difference between aggressive medical intervention and comfort care was considered in light of the patient's goals of care. The wife and family seem on-board with comfort measures and more of a hospice focus, but the patient at this point wants to keep trying.   I have requested a second physical therapy consult to help the patient assess what he can and can not do.  Hospice and Palliative Care services outpatient were explained and offered.  The family would like to meet with Hospice to understand specifically what they may be able to do for [place] at [place].    Questions and concerns were addressed.  Hard Choices booklet  left for review. The family was encouraged to call with questions or concerns.  PMT will continue to support holistically.   Primary Decision Maker:  PATIENT and he defers to his wife - [place] work request placed to see if Hospice will meet with William Braun and family to explain what they could do for him at [place].    The patient has both Medicaid and Medicare - consequently Medicaid will pay for SNF and Medicare will pay for Hospice.   Mr. William Braun can have Hospice Services whether he returns to SNF for acute rehab or for long term care.   IF the family opts not to take hospice services at this time, please write in the D/C summary that Palliative Care will follow at [facility].    A second PT eval was requested to help William Braun understand his own limitations (what he can and can not do).   Chaplain services requested for support and prayer.   MOST form discussed.  I will leave a copy for Mrs.  Cannedy.   Family very concerned about lack of side bed rails and SNF.  The patient has fallen out of bed 3X since bed rails were removed.  Can you place a recommendation in the D/C summary for side bed rails to be up at SNF.   Code Status/Advance Care Planning:  DNR    Symptom Management:   Per primary team.  Patient complains most about his right wrist pain (Gout).  He is already on prednisone and allopurinol  Psycho-social/Spiritual:   Desire for further Chaplaincy support: [facility].  Request Chaplain   Prognosis:   < 6 months given rapid decline.  5 hospitalizations since December 2017.  Frequent infections, diastolic heart failure with pulmonary HTN, chronic respiratory failure (on bipap qhs), underlying CLL and chronic kidney failure.  Now is likely immobile.  Discharge Planning: [place] with Hospice vs SNF with Palliative.      Primary Diagnoses: Present on Admission: . Sepsis secondary to UTI ([place]) . Acute lower UTI . ATRIAL FIBRILLATION . Chronic diastolic CHF (congestive heart failure) ([place]) . Chronic respiratory failure ([place]) . CLL (chronic lymphocytic leukemia) ([place]) . Diabetes mellitus with polyneuropathy ([place]) . Obesity hypoventilation syndrome ([place]) . Sepsis ([place])   I have reviewed the medical record, interviewed the patient and family, and examined the patient. The following aspects are pertinent.  Past Medical History:  Diagnosis Date  . Atrial fibrillation ([place])   . Cerebrovascular disease   . CHF (congestive heart failure) ([place])   . CLL (chronic lymphocytic leukemia) ([place]) 11/17/2014  . COPD (chronic obstructive pulmonary disease) ([place])   . History of thrombocytopenia   . Obesity   . Osteoarthritis   . Other and unspecified hyperlipidemia   . Type II or unspecified type diabetes mellitus without mention of complication, not stated as uncontrolled   . Unspecified essential hypertension    Social History   Social History  .  Marital status: Married    Spouse name: N/A  . Number of children: N/A  . Years of education: N/A   Occupational History  . Retired    Social History Main Topics  . Smoking status: Former Smoker    Packs/day: 2.00    Years: 34.00    Types: Cigarettes    Start date: 04/23/1946    Quit date: 09/19/1978  . Smokeless tobacco: Never Used     Comment: quit 35 years ago  . Alcohol  use No  . Drug use: Unknown  . Sexual activity: Not Currently   Other Topics Concern  . None   Social History Narrative  . None   Family History  Problem Relation Age of Onset  . Heart disease Father    Scheduled Meds: . allopurinol  100 mg Oral Daily  . atorvastatin  10 mg Oral q1800  . divalproex  250 mg Oral BID  . insulin aspart  0-20 Units Subcutaneous TID WC  . insulin aspart  0-5 Units Subcutaneous QHS  . insulin glargine  10 Units Subcutaneous QHS  . mometasone-formoterol  2 puff Inhalation BID  . nystatin cream  1 application Topical BID  . polyethylene glycol  17 g Oral Daily  . predniSONE  30 mg Oral Q breakfast  . sodium chloride flush  3 mL Intravenous Q12H  . sodium chloride flush  3 mL Intravenous Q12H  . tiotropium  18 mcg Inhalation Daily  . torsemide  60 mg Oral Daily  . warfarin  5 mg Oral ONCE-1800  . Warfarin - Pharmacist Dosing Inpatient   Does not apply q1800   Continuous Infusions: . sodium chloride    . sodium chloride    . vancomycin Stopped (03/07/17 1249)   PRN Meds:.sodium chloride, acetaminophen **OR** acetaminophen, HYDROcodone-acetaminophen, ipratropium-albuterol, magnesium hydroxide, ondansetron **OR** ondansetron (ZOFRAN) IV, senna-docusate, sodium chloride flush Allergies  Allergen Reactions  . Quinine Palpitations and Other (See Comments)    Seizure   . Uloric [Febuxostat] Rash   Review of Systems Patient complains of right wrist pain that feels like a bee sting.   Otherwise 12 systems reviewed and patient was without complaint.  Physical Exam  Well  developed, A&O, pleasant, cooperative CV brady Resp on 3L mildly increased work of breathing at rest Abdomen obese, soft NT LE 1-2 edema, darkened skin (changes of venous stasis)  Vital Signs: BP 110/69 (BP Location: Left Arm)   Pulse (!) 52   Temp 98 F (36.7 C) (Oral)   Resp 19   Wt 117.5 kg (259 lb 0.7 oz)   SpO2 97%   BMI 33.26 kg/m  Pain Assessment: No/denies pain POSS *See Group Information*: S-Acceptable,Sleep, easy to arouse Pain Score: Asleep   SpO2: SpO2: 97 % O2 Device:SpO2: 97 % O2 Flow Rate: .O2 Flow Rate (L/min): 3 L/min  IO: Intake/output summary:   Intake/Output Summary (Last 24 hours) at 03/08/17 1212 Last data filed at 03/08/17 0500  Gross per 24 hour  Intake              120 ml  Output             3775 ml  Net            -3655 ml    LBM: Last BM Date: 03/07/17 Baseline Weight: Weight: 112.3 kg (247 lb 9.2 oz) Most recent weight: Weight: 117.5 kg (259 lb 0.7 oz)     Palliative Assessment/Data:   Flowsheet Rows     Most Recent Value  Intake Tab  Referral Department  Hospitalist  Unit at Time of Referral  Intermediate Care Unit  Palliative Care Primary Diagnosis  Cardiac  Date Notified  03/07/17  Palliative Care Type  New Palliative care  Reason for referral  Clarify Goals of Care  Date of Admission  03/04/17  Date first seen by Palliative Care  03/08/17  # of days Palliative referral response time  1 Day(s)  # of days IP prior to Palliative referral  3  Clinical Assessment  Palliative Performance Scale Score  30%  Psychosocial & Spiritual Assessment  Palliative Care Outcomes  Patient/Family meeting held?  Yes  Who was at the meeting?  patient, wife, family  Palliative Care Outcomes  Clarified goals of care, Counseled regarding hospice, Provided psychosocial or spiritual support      Time In: 8:00 Time Out: 8:45 Time In:  10:30 Time Out:  11:30 Time Total: 105 Greater than 50%  of this time was spent counseling and coordinating care  related to the above assessment and plan.  Signed by: Imogene Burn, PA-C Palliative Medicine Pager: [phone]  Please contact Palliative Medicine Team phone at [phone] for questions and concerns.  For individual provider: See Shea Evans

## 2017-03-08 NOTE — Progress Notes (Signed)
Date:  March 08, 2017  Chart reviewed for concurrent status and case management needs.  Will continue to follow patient progress.  Remains on bipap and hfnc/PMT meeting with wife [phone] at 1030. Discharge Planning: following for needs  Expected discharge date: [id number]  Velva Harman, BSN, [place], [place]

## 2017-03-08 NOTE — Progress Notes (Signed)
Pharmacy Antibiotic Note  William Braun is a 83 y.o. male admitted on 03/04/2017 with sepsis.  Pharmacy has been consulted for vancomycin dosing on 6/17  Plan: Day 4 Abx's 1) Continue Vancomycin 1250mg  IV q24 per current renal function 2) Check trough prior to dose tomorrow AM (will be prior to 5th dose) 3) Noted plan per Md notes to switch to PO Bactrim at discharge  Weight: 259 lb 0.7 oz (117.5 kg)  Temp (24hrs), Avg:97.9 F (36.6 C), Min:97.5 F (36.4 C), Max:98.2 F (36.8 C)   Recent Labs Lab 03/04/17 0250 03/05/17 0016 03/05/17 0345 03/05/17 1207 03/05/17 1432 03/06/17 0303 03/06/17 0757 03/07/17 0338 03/08/17 0528  WBC 20.7*  --  15.1*  --   --   --  20.1*  --   --   CREATININE 1.79*  --  1.67*  --   --  1.66* 1.48* 1.44* 1.37*  LATICACIDVEN  --  2.0*  --  3.8* 4.5*  --   --   --   --     Estimated Creatinine Clearance: 57.6 mL/min (A) (by C-G formula based on SCr of 1.37 mg/dL (H)).    Allergies  Allergen Reactions  . Quinine Palpitations and Other (See Comments)    Seizure   . Uloric [Febuxostat] Rash    Antimicrobials this admission: 6/17 ceftriaxone >> 6/18 6/17 vancomycin >>   Dose adjustments this admission:  Microbiology results: 6/16 BCx: ngtd 6/16 UCx: 70K cfu/ml S. Aureus - MRSA 6/16 MRSA PCR: negative  Thank you for allowing pharmacy to be a part of this patient's care.  Adrian Saran, PharmD, BCPS Pager [phone] 03/08/2017 10:52 AM;l

## 2017-03-08 NOTE — Progress Notes (Signed)
[place] for warfarin Indication: hx atrial fibrillation  Allergies  Allergen Reactions  . Quinine Palpitations and Other (See Comments)    Seizure   . Uloric [Febuxostat] Rash    Patient Measurements: Weight: 259 lb 0.7 oz (117.5 kg) Heparin Dosing Weight:   Vital Signs: Temp: 98 F (36.7 C) (06/20 0720) Temp Source: Oral (06/20 0720) BP: 110/69 (06/20 0400) Pulse Rate: 52 (06/20 0600)  Labs:  Recent Labs  03/06/17 0303 03/06/17 0757 03/07/17 0338 03/08/17 0528  HGB  --  11.6*  --   --   HCT  --  34.9*  --   --   PLT  --  187  --   --   LABPROT 31.5*  --  20.6* 17.3*  INR 2.97  --  1.75 1.40  CREATININE 1.66* 1.48* 1.44* 1.37*    Estimated Creatinine Clearance: 57.6 mL/min (A) (by C-G formula based on SCr of 1.37 mg/dL (H)).   Medications:  Home warfarin regimen: 5 mg daily except 2.5 mg on MWF  Assessment: Patient's an 83 y.o M with hx CVA and afib on warfarin PTA presented to the ED on 03/04/17 s/p fall at Rehab facility with left forearm skin tear.  Warfarin resumed on admission.   Today, 03/08/2017: - INR continues to be subtherapeutic due to doses held prior to restart of warfarin 6/18 1mg  and 2.5mg  6/19 - hgb and plt trending down slightly - no bleeding documented - on dysphagia 3 diet - drug-drug intxns: being on abx (vanc and CTX) can make patient more sensitive to warfarin  Goal of Therapy:  INR 2-3 Monitor platelets by anticoagulation protocol: Yes   Plan:  1) Increase warfarin to 5mg  today 2) Monitor for s/s bleeding 3) Daily INR   Adrian Saran, PharmD, BCPS Pager [phone] 03/08/2017 10:54 AM

## 2017-03-08 NOTE — Progress Notes (Signed)
Pt. placed on BiPAP for h/s, humidity full, 3 lpm oxygen placed into circuit, tolerating well, RT to monitor.

## 2017-03-08 NOTE — Progress Notes (Signed)
[facility] Liaison:  RN visit  Met with patient and wife.  Answered questions that wife had about palliative vs. Hospice services.   Patient and wife are interested in palliative care to follow at [facility].  Spoke with Sanjuana Mae, she is having information put in the discharge instructions and has made Suanne Marker, at [facility], aware of patient wishes.    Thank you,  Edyth Gunnels, RN, BSN [facility] Liaison [phone]  All hospital liaisons are now on [place].

## 2017-03-08 NOTE — Progress Notes (Signed)
PROGRESS NOTE    William Braun  JHE:[id number] DOB: July 10, 1933 DOA: 03/04/2017 PCP: Hendricks Limes, MD   Brief Narrative:  83 y.o.malewith a past medical history of atrial fibrillation on anticoagulation with warfarin, history of COPD, history of OSA/OHSon BiPAP at night and O2 at daytime, chronic kidney disease stage III, history of gout on allopurinol, history of diabetes mellitus on insulin, history of chronic diastolic CHF with pulmonary hypertension, who lives in an assisted living facility. Patient apparently had a fall the night prior to Admission. He rolled out of his bed and the injured his left forearm resulting in a skin tear. He denies any head injuries. No headaches. While he was being evaluated in the emergency department, patient developed shortness of breath. A CT angiogram of the chest was done which did not show any PE. No interstitial edema was noted either. He was found to have abnormal UA and started on ceftriaxone and admitted for Sepsis 2/2 to UTI. He was deemed medically stable to be transferred out of SDU on 03/08/17 and is improving. Palliative Care Medicine met with patient to discuss Goals of Care.   Assessment & Plan:   Principal Problem:   Sepsis secondary to UTI [facility]) Active Problems:   Obesity hypoventilation syndrome (HCC)   ATRIAL FIBRILLATION   Chronic respiratory failure (HCC)   Chronic diastolic CHF (congestive heart failure) (HCC)   CLL (chronic lymphocytic leukemia) (HCC)   Diabetes mellitus with polyneuropathy (HCC)   Sepsis ([place])   Acute lower UTI  Sepsis due to UTI (recurrent) -Urine culture growingMethicillin-resistant staph aureus, 70,000 colonies per mL.  -Patient was started on ceftriaxone , added  vancomycin per pharmacy consultation..  -Patient did get Demadex 60 mg and he became hypotensive. -Demadex and Toprol-XL are currently on hold as patient's blood pressure was 77/45 a few days.  -Was Started gentle IV hydration with normal saline  at 100 ML per hour. At this moment sepsis has stabilized, patient is off IV fluids and blood pressure is 125/88.  -Patient can be switched to Bactrim at the time of discharge likely tomorrow  -Palliative Care Met with Patient and patient and wife are interested in going to SNF with Palliative Care to follow rather than Hospice Services as patient has had multiple admissions for the past 3 months  Fall resulting in skin tear of left forearm  -Patient denies passing out so CT Head not done -Left forearm in dressing, wound Care following   Right wrist pain -Improved after patient started on  prednisone 40 mg by mouth daily -Will change prednisone to 30 mg daily from today. Can be discharged on prednisone taper when medically ready.  Chronic kidney disease stage III -Patient's creatinine is slowly improving and went from 1.44 -> 1.29.  -Repeat BMP in a.m.  Chronic diastolic CHF  -Does not appear to be in acute CHF at this time. Euvolemic.  -He was started on IV fluids and is currently KVO. patient was on high-dose Demadex 60 mg twice a day at home. -Cardiology Consulted and recommended placing patient on Demadex 60 mg po Daily  -Will need to follow up with Cardiology as an outpatient  Hypokalemia -Patient's K+ was 3.1 this AM and likely from Diuresis -Replete with po KCl 40 mEQ BID x 2 doses -Continue to Monitor and Replete Potassium as Necessary -Repeat CMP in AM  Diabetes Mellitus -Continue Lantus 10 units sq Daily -C/w Resistant Novolog SSI AC/HS  -Continue to Monitor CBG's; CBG's ranging from 184-346  Chronic hypoxic respiratory failure/OSA/OHS -Patient is on BiPAP at night along with home oxygen. Continue BiPAP and oxygen in the hospital.  History of Atrial Fibrillation -Patient is currently Anticoagulated with Warfarin. Continue warfarin per pharmacy consultation. -Repeat PT-INR in AM; PT-INR this AM was 17.3-1.40 -C/w Telemetry  -Toprol XL on Hold due to  Hypotension  History of CLL -Stable.  -Followed by oncology as outpatient. -Palliative Care Consulted and Recommending Palliative at SNF  Leukocytosis  -Chronically elevated WBC, secondary to CLL. -WBC went from 20.1 -> 21.1 -Continue to Monitor and Repeat CBC in AM   DVT prophylaxis: Anticoagulated with Warfarin  Code Status: DO NOT RESUSCITATE Family Communication: No family at bedside Disposition Plan: Back to SNF when medically Stable with Palliative Care to Follow  Consultants:  -Cardiology Dr. Candee Furbish (Signed off 03/07/17) -Palliative Care Medicine   Procedures: None   Antimicrobials:  Anti-infectives    Start     Dose/Rate Route Frequency Ordered Stop   03/06/17 1200  vancomycin (VANCOCIN) 1,250 mg in sodium chloride 0.9 % 250 mL IVPB     1,250 mg 166.7 mL/hr over 90 Minutes Intravenous Every 24 hours 03/05/17 1142     03/05/17 1200  vancomycin (VANCOCIN) 2,000 mg in sodium chloride 0.9 % 500 mL IVPB     2,000 mg 250 mL/hr over 120 Minutes Intravenous  Once 03/05/17 1142 03/05/17 1448   03/05/17 0600  cefTRIAXone (ROCEPHIN) 1 g in dextrose 5 % 50 mL IVPB  Status:  Discontinued     1 g 100 mL/hr over 30 Minutes Intravenous Every 24 hours 03/04/17 0853 03/06/17 1148   03/04/17 0615  cefTRIAXone (ROCEPHIN) 1 g in dextrose 5 % 50 mL IVPB     1 g 100 mL/hr over 30 Minutes Intravenous  Once 03/04/17 0601 03/04/17 0855     Subjective: Seen and examined and stated his left arm was hurting. Did not know if he had any lower extremity swelling. Denied any CP or SOB and felt ok.   Objective: Vitals:   03/08/17 0322 03/08/17 0400 03/08/17 0500 03/08/17 0600  BP:  110/69    Pulse:  61 (!) 52 (!) 52  Resp:  (!) 21 (!) 22 19  Temp: 98.2 F (36.8 C)     TempSrc: Axillary     SpO2:  97% 98% 97%  Weight:        Intake/Output Summary (Last 24 hours) at 03/08/17 0728 Last data filed at 03/08/17 0500  Gross per 24 hour  Intake              490 ml  Output              4475 ml  Net            -3985 ml   Filed Weights   03/05/17 0607 03/07/17 0357  Weight: 112.3 kg (247 lb 9.2 oz) 117.5 kg (259 lb 0.7 oz)   Examination: Physical Exam:  Constitutional: Elderly Caucasian Male in NAD and appears calm and comfortable Eyes: Lids and conjunctivae normal, sclerae anicteric  ENMT: External Ears, Nose appear normal. Grossly normal hearing. Mucous membranes are moist.  Neck: Appears normal, supple, no cervical masses, normal ROM, no appreciable thyromegaly, no visible JVD Respiratory: Diminished to auscultation bilaterally, no wheezing, rales, rhonchi or crackles. Normal respiratory effort and patient is not tachypenic. No accessory muscle use.  Cardiovascular: Irregularly Irregular, no murmurs / rubs / gallops. S1 and S2 auscultated. Mild lower extremity edema.  Abdomen: Soft, non-tender,  non-distended. No masses palpated. No appreciable hepatosplenomegaly. Bowel sounds positive.  GU: Deferred. Has Condom Cath on Musculoskeletal: No clubbing / cyanosis of digits/nails. No joint deformity upper and lower extremities. Good ROM, no contractures. Normal strength and muscle tone.  Skin: No rashes, lesions, ulcers. No induration; Warm and dry.  Neurologic: CN 2-12 grossly intact with no focal deficits.  Psychiatric: Normal judgment and insight. Alert and oriented x 3. Normal mood and appropriate affect.   Data Reviewed: I have personally reviewed following labs and imaging studies  CBC:  Recent Labs Lab 03/04/17 0250 03/05/17 0345 03/06/17 0757  WBC 20.7* 15.1* 20.1*  NEUTROABS 11.1*  --   --   HGB 14.2 12.3* 11.6*  HCT 42.7 37.9* 34.9*  MCV 93.2 91.8 90.9  PLT 211 203 546   Basic Metabolic Panel:  Recent Labs Lab 03/04/17 0250 03/05/17 0345 03/06/17 0303 03/06/17 0757 03/07/17 0338 03/08/17 0528  NA 140 136  --  137 136  --   K 4.6 4.3  --  3.2* 4.2  --   CL 94* 94*  --  97* 96*  --   CO2 32 32  --  28 32  --   GLUCOSE 170* 320*  --  137* 242*   --   BUN 46* 48*  --  50* 48*  --   CREATININE 1.79* 1.67* 1.66* 1.48* 1.44* 1.37*  CALCIUM 9.1 8.5*  --  8.3* 8.3*  --    GFR: Estimated Creatinine Clearance: 57.6 mL/min (A) (by C-G formula based on SCr of 1.37 mg/dL (H)). Liver Function Tests:  Recent Labs Lab 03/04/17 0250  AST 22  ALT 10*  ALKPHOS 71  BILITOT 0.8  PROT 7.7  ALBUMIN 2.7*   No results for input(s): LIPASE, AMYLASE in the last 168 hours. No results for input(s): AMMONIA in the last 168 hours. Coagulation Profile:  Recent Labs Lab 03/04/17 0250 03/05/17 0345 03/06/17 0303 03/07/17 0338 03/08/17 0528  INR 3.12 3.60 2.97 1.75 1.40   Cardiac Enzymes:  Recent Labs Lab 03/04/17 0250 03/04/17 1213 03/04/17 1821  TROPONINI 0.03* 0.04* 0.03*   BNP (last 3 results) No results for input(s): PROBNP in the last 8760 hours. HbA1C: No results for input(s): HGBA1C in the last 72 hours. CBG:  Recent Labs Lab 03/06/17 2135 03/07/17 0756 03/07/17 1206 03/07/17 1703 03/07/17 2058  GLUCAP 335* 184* 228* 346* 304*   Lipid Profile: No results for input(s): CHOL, HDL, LDLCALC, TRIG, CHOLHDL, LDLDIRECT in the last 72 hours. Thyroid Function Tests: No results for input(s): TSH, T4TOTAL, FREET4, T3FREE, THYROIDAB in the last 72 hours. Anemia Panel: No results for input(s): VITAMINB12, FOLATE, FERRITIN, TIBC, IRON, RETICCTPCT in the last 72 hours. Sepsis Labs:  Recent Labs Lab 03/05/17 0016 03/05/17 1207 03/05/17 1432  LATICACIDVEN 2.0* 3.8* 4.5*    Recent Results (from the past 240 hour(s))  Culture, blood (routine x 2)     Status: None (Preliminary result)   Collection Time: 03/04/17  2:56 AM  Result Value Ref Range Status   Specimen Description BLOOD RIGHT ANTECUBITAL  Final   Special Requests   Final    BOTTLES DRAWN AEROBIC AND ANAEROBIC Blood Culture adequate volume   Culture   Final    NO GROWTH 3 DAYS Performed at [facility], [place] [address], [place], [place] [address]     Report Status PENDING  Incomplete  Culture, blood (routine x 2)     Status: None (Preliminary result)   Collection Time: 03/04/17  4:02 AM  Result Value Ref Range Status   Specimen Description BLOOD RIGHT ANTECUBITAL  Final   Special Requests   Final    BOTTLES DRAWN AEROBIC AND ANAEROBIC Blood Culture adequate volume   Culture   Final    NO GROWTH 3 DAYS Performed at [facility], [address], [place], [place] [address]    Report Status PENDING  Incomplete  Urine culture     Status: Abnormal   Collection Time: 03/04/17  4:51 AM  Result Value Ref Range Status   Specimen Description URINE, RANDOM  Final   Special Requests NONE  Final   Culture (A)  Final    70,000 COLONIES/mL METHICILLIN RESISTANT STAPHYLOCOCCUS AUREUS   Report Status 03/06/2017 FINAL  Final   Organism ID, Bacteria METHICILLIN RESISTANT STAPHYLOCOCCUS AUREUS (A)  Final      Susceptibility   Methicillin resistant staphylococcus aureus - MIC*    CIPROFLOXACIN >=8 RESISTANT Resistant     GENTAMICIN <=0.5 SENSITIVE Sensitive     NITROFURANTOIN <=16 SENSITIVE Sensitive     OXACILLIN >=4 RESISTANT Resistant     TETRACYCLINE <=1 SENSITIVE Sensitive     VANCOMYCIN <=0.5 SENSITIVE Sensitive     TRIMETH/SULFA <=10 SENSITIVE Sensitive     CLINDAMYCIN <=0.25 SENSITIVE Sensitive     RIFAMPIN <=0.5 SENSITIVE Sensitive     Inducible Clindamycin NEGATIVE Sensitive     * 70,000 COLONIES/mL METHICILLIN RESISTANT STAPHYLOCOCCUS AUREUS  MRSA PCR Screening     Status: None   Collection Time: 03/04/17  8:46 AM  Result Value Ref Range Status   MRSA by PCR NEGATIVE NEGATIVE Final    Comment:        The GeneXpert MRSA Assay (FDA approved for NASAL specimens only), is one component of a comprehensive MRSA colonization surveillance program. It is not intended to diagnose MRSA infection nor to guide or monitor treatment for MRSA infections.     Radiology Studies: No results found.   Scheduled Meds: . allopurinol   100 mg Oral Daily  . atorvastatin  10 mg Oral q1800  . divalproex  250 mg Oral BID  . insulin aspart  0-20 Units Subcutaneous TID WC  . insulin aspart  0-5 Units Subcutaneous QHS  . insulin glargine  10 Units Subcutaneous QHS  . mometasone-formoterol  2 puff Inhalation BID  . nystatin cream  1 application Topical BID  . polyethylene glycol  17 g Oral Daily  . predniSONE  30 mg Oral Q breakfast  . sodium chloride flush  3 mL Intravenous Q12H  . sodium chloride flush  3 mL Intravenous Q12H  . tiotropium  18 mcg Inhalation Daily  . torsemide  60 mg Oral Daily  . Warfarin - Pharmacist Dosing Inpatient   Does not apply q1800   Continuous Infusions: . sodium chloride    . sodium chloride    . vancomycin Stopped (03/07/17 1249)    LOS: 4 days   Kerney Elbe, DO [facility] Pager [phone]  If 7PM-7AM, please contact night-coverage [email or web address] Password [facility] 03/08/2017, 7:28 AM

## 2017-03-08 NOTE — Progress Notes (Signed)
CSW following to assist with d/c planning. CSW met with Palliative Care Team / pt / spouse this am. Pt / spouse are interested in palliative care services at [facility] and are requesting more info about this service. Erling Conte LCSW from [facility] has been contacted and will arrange for a representative from [facility] to visit with pt / spouse while still in hospital. CSW will assist with dc planning back to [facility] once stable. SNF has confirmed that pt has medicaid insurance in place at this time as well as Engineer, maintenance (IT).   Werner Lean LCSW [phone]

## 2017-03-09 DIAGNOSIS — Z9981 Dependence on supplemental oxygen: Secondary | ICD-10-CM | POA: Diagnosis not present

## 2017-03-09 DIAGNOSIS — M6281 Muscle weakness (generalized): Secondary | ICD-10-CM | POA: Diagnosis not present

## 2017-03-09 DIAGNOSIS — N183 Chronic kidney disease, stage 3 (moderate): Secondary | ICD-10-CM | POA: Diagnosis not present

## 2017-03-09 DIAGNOSIS — R488 Other symbolic dysfunctions: Secondary | ICD-10-CM | POA: Diagnosis not present

## 2017-03-09 DIAGNOSIS — R6520 Severe sepsis without septic shock: Secondary | ICD-10-CM | POA: Diagnosis not present

## 2017-03-09 DIAGNOSIS — E1142 Type 2 diabetes mellitus with diabetic polyneuropathy: Secondary | ICD-10-CM | POA: Diagnosis not present

## 2017-03-09 DIAGNOSIS — J449 Chronic obstructive pulmonary disease, unspecified: Secondary | ICD-10-CM | POA: Diagnosis not present

## 2017-03-09 DIAGNOSIS — A419 Sepsis, unspecified organism: Secondary | ICD-10-CM | POA: Diagnosis not present

## 2017-03-09 DIAGNOSIS — J9611 Chronic respiratory failure with hypoxia: Secondary | ICD-10-CM | POA: Diagnosis not present

## 2017-03-09 DIAGNOSIS — M25531 Pain in right wrist: Secondary | ICD-10-CM | POA: Diagnosis not present

## 2017-03-09 DIAGNOSIS — N390 Urinary tract infection, site not specified: Secondary | ICD-10-CM | POA: Diagnosis not present

## 2017-03-09 DIAGNOSIS — E662 Morbid (severe) obesity with alveolar hypoventilation: Secondary | ICD-10-CM | POA: Diagnosis not present

## 2017-03-09 DIAGNOSIS — I4891 Unspecified atrial fibrillation: Secondary | ICD-10-CM | POA: Diagnosis not present

## 2017-03-09 DIAGNOSIS — R1312 Dysphagia, oropharyngeal phase: Secondary | ICD-10-CM | POA: Diagnosis not present

## 2017-03-09 DIAGNOSIS — R4182 Altered mental status, unspecified: Secondary | ICD-10-CM | POA: Diagnosis not present

## 2017-03-09 DIAGNOSIS — I482 Chronic atrial fibrillation: Secondary | ICD-10-CM | POA: Diagnosis not present

## 2017-03-09 DIAGNOSIS — N3000 Acute cystitis without hematuria: Secondary | ICD-10-CM | POA: Diagnosis not present

## 2017-03-09 DIAGNOSIS — J80 Acute respiratory distress syndrome: Secondary | ICD-10-CM | POA: Diagnosis not present

## 2017-03-09 DIAGNOSIS — I5032 Chronic diastolic (congestive) heart failure: Secondary | ICD-10-CM | POA: Diagnosis not present

## 2017-03-09 DIAGNOSIS — A4102 Sepsis due to Methicillin resistant Staphylococcus aureus: Secondary | ICD-10-CM | POA: Diagnosis not present

## 2017-03-09 DIAGNOSIS — G4733 Obstructive sleep apnea (adult) (pediatric): Secondary | ICD-10-CM | POA: Diagnosis not present

## 2017-03-09 DIAGNOSIS — I272 Pulmonary hypertension, unspecified: Secondary | ICD-10-CM | POA: Diagnosis not present

## 2017-03-09 DIAGNOSIS — C9190 Lymphoid leukemia, unspecified not having achieved remission: Secondary | ICD-10-CM | POA: Diagnosis not present

## 2017-03-09 DIAGNOSIS — R52 Pain, unspecified: Secondary | ICD-10-CM | POA: Diagnosis not present

## 2017-03-09 DIAGNOSIS — I130 Hypertensive heart and chronic kidney disease with heart failure and stage 1 through stage 4 chronic kidney disease, or unspecified chronic kidney disease: Secondary | ICD-10-CM | POA: Diagnosis not present

## 2017-03-09 DIAGNOSIS — R0602 Shortness of breath: Secondary | ICD-10-CM | POA: Diagnosis not present

## 2017-03-09 DIAGNOSIS — R451 Restlessness and agitation: Secondary | ICD-10-CM | POA: Diagnosis not present

## 2017-03-09 DIAGNOSIS — C9510 Chronic leukemia of unspecified cell type not having achieved remission: Secondary | ICD-10-CM | POA: Diagnosis not present

## 2017-03-09 LAB — CULTURE, BLOOD (ROUTINE X 2)
CULTURE: NO GROWTH
Culture: NO GROWTH
Special Requests: ADEQUATE
Special Requests: ADEQUATE

## 2017-03-09 LAB — GLUCOSE, CAPILLARY
GLUCOSE-CAPILLARY: 118 mg/dL — AB (ref 65–99)
GLUCOSE-CAPILLARY: 181 mg/dL — AB (ref 65–99)

## 2017-03-09 MED ORDER — WARFARIN SODIUM 2.5 MG PO TABS: 2.5000 mg | ORAL_TABLET | Freq: Every day | ORAL | 0 refills | Status: DC

## 2017-03-09 MED ORDER — SULFAMETHOXAZOLE-TRIMETHOPRIM 800-160 MG PO TABS
1.0000 | ORAL_TABLET | Freq: Two times a day (BID) | ORAL | Status: DC
  Administered 2017-03-09: 1 via ORAL
  Filled 2017-03-09: qty 1

## 2017-03-09 MED ORDER — POTASSIUM CHLORIDE CRYS ER 20 MEQ PO TBCR: 20.0000 meq | EXTENDED_RELEASE_TABLET | Freq: Two times a day (BID) | ORAL | 0 refills | Status: DC

## 2017-03-09 MED ORDER — WARFARIN SODIUM 2.5 MG PO TABS: 2.5000 mg | ORAL_TABLET | Freq: Once | ORAL | Status: DC

## 2017-03-09 MED ORDER — HYDROCODONE-ACETAMINOPHEN 5-325 MG PO TABS: 1.0000 | ORAL_TABLET | Freq: Three times a day (TID) | ORAL | 0 refills | Status: AC | PRN

## 2017-03-09 MED ORDER — PREDNISONE 10 MG (21) PO TBPK
ORAL_TABLET | ORAL | 0 refills | Status: DC
Start: 2017-03-09 — End: 2017-03-14

## 2017-03-09 MED ORDER — NITROFURANTOIN MONOHYD MACRO 100 MG PO CAPS: 100.0000 mg | ORAL_CAPSULE | Freq: Two times a day (BID) | ORAL | 0 refills | Status: AC

## 2017-03-09 MED ORDER — TORSEMIDE 20 MG PO TABS: 60.0000 mg | ORAL_TABLET | Freq: Every day | ORAL | 0 refills | Status: AC

## 2017-03-09 MED ORDER — SULFAMETHOXAZOLE-TRIMETHOPRIM 800-160 MG PO TABS: 1.0000 | ORAL_TABLET | Freq: Two times a day (BID) | ORAL | 0 refills | Status: DC

## 2017-03-09 NOTE — Care Management Important Message (Signed)
Important Message  Patient Details IM Letter given to Cookie/Case Manager to present to Patient Name: William Braun MRN: [id number] Date of Birth: 05-24-1933   Medicare Important Message Given:  Yes    Kerin Salen 03/09/2017, 10:02 AM

## 2017-03-09 NOTE — Progress Notes (Signed)
Pt transferred from ICU

## 2017-03-09 NOTE — Progress Notes (Signed)
Physical Therapy Treatment Patient Details Name: William Braun MRN: [id number] DOB: 1932/12/05 Today's Date: 03/09/2017    History of Present Illness Pt was admitted from [facility] with sepsis secondary to UTI.  PMH:  A Fib, COPD, bipap at night, DM, CHF, CLL    PT Comments    Pt very pleasant and happy to be able to get OOB and transfer to recliner today.  Pt will likely need to practice safe transfers to w/c prior to d/c home and plan is for d/c to SNF.    Follow Up Recommendations  SNF;Supervision/Assistance - 24 hour     Equipment Recommendations  None recommended by PT    Recommendations for Other Services       Precautions / Restrictions Precautions Precautions: Fall Precaution Comments: chronic 3L O2  Restrictions Weight Bearing Restrictions: No Other Position/Activity Restrictions: possible gout in RUE, still painful 6/21    Mobility  Bed Mobility Overal bed mobility: Needs Assistance Bed Mobility: Supine to Sit     Supine to sit: +2 for safety/equipment;Min guard;HOB elevated     General bed mobility comments: HOB raised; cues for sequence, increased time  Transfers Overall transfer level: Needs assistance Equipment used: 1 person hand held assist Transfers: Sit to/from [name] Sit to Stand: Min assist   Squat pivot transfers: Min assist     General transfer comment: pt requested recliner be brought very close to bed, assist to rise, stabilize, and control descent, recommended RW however pt reaching for armrests and initiating transfer so assisted with 1 HHA for his other UE, did not stand fully erect; increased WOB however SpO2 93% on 3L O2 after transfer (98% on 3L O2 prior to transfer)  Ambulation/Gait             General Gait Details: pt felt unable to tolerate even short steps over to recliner   Stairs            Wheelchair Mobility    Modified Rankin (Stroke Patients Only)       Balance                                             Cognition Arousal/Alertness: Awake/alert Behavior During Therapy: WFL for tasks assessed/performed Overall Cognitive Status: Impaired/Different from baseline Area of Impairment: Memory                     Memory: Decreased short-term memory         General Comments:  Decreased memory, follows one step commands consistently       Exercises      General Comments        Pertinent Vitals/Pain Pain Assessment: Faces Faces Pain Scale: Hurts little more Pain Location: R hand Pain Intervention(s): Monitored during session    Home Living                      Prior Function            PT Goals (current goals can now be found in the care plan section) Progress towards PT goals: Progressing toward goals    Frequency    Min 3X/week      PT Plan Current plan remains appropriate    Co-evaluation              AM-PAC PT "6 Clicks" Daily  Activity  Outcome Measure  Difficulty turning over in bed (including adjusting bedclothes, sheets and blankets)?: Total Difficulty moving from lying on back to sitting on the side of the bed? : Total Difficulty sitting down on and standing up from a chair with arms (Braun.g., wheelchair, bedside commode, etc,.)?: Total Help needed moving to and from a bed to chair (including a wheelchair)?: A Lot Help needed walking in hospital room?: Total Help needed climbing 3-5 steps with a railing? : Total 6 Click Score: 7    End of Session Equipment Utilized During Treatment: Gait belt;Oxygen Activity Tolerance: Patient limited by fatigue Patient left: in chair;with call bell/phone within reach;with chair alarm set   PT Visit Diagnosis: Other abnormalities of gait and mobility (R26.89)     Time: 6734-1937 PT Time Calculation (min) (ACUTE ONLY): 16 min  Charges:  $Therapeutic Activity: 8-22 mins                    G Codes:       William Braun, PT, DPT 03/09/2017 Pager:  [phone]  William Braun 03/09/2017, 12:48 PM

## 2017-03-09 NOTE — Progress Notes (Signed)
Report called to Einar Pheasant, RN at [facility].  All questions answered.  VSS.  PT transported via [place].

## 2017-03-09 NOTE — Consult Note (Signed)
   [facility] CM Inpatient Consult   03/09/2017  KEKAI GETER [birth year] [id number]    [facility] Care Management follow up.   Chart reviewed. Noted Mr. Berg is to return to [facility] with palliative follow up.   Confirmed with inpatient LCSW.   Will alert [facility] Care Management LCSW of above.   Marthenia Rolling, MSN-Ed, RN,BSN [facility] Liaison [phone]

## 2017-03-09 NOTE — Discharge Summary (Signed)
Physician Discharge Summary  William Braun YSA:[id number] DOB: [birth year] DOA: 03/04/2017  PCP: Hendricks Limes, MD  Admit date: 03/04/2017 Discharge date: 03/09/2017  Admitted From: SNF Disposition:  SNF  Recommendations for Outpatient Follow-up:  1. Follow up with PCP in 1-2 weeks 2. Follow up with Cardiology within 1 week 3. Repeat PT-INR daily until Therapeutic as patient is on Coumadin 4. Please obtain CMP/CBC, Mag, Phos in one week 5. Palliative Care to Follow at [facility] 6. Recommend that Side Bed Rails to be up at SNF as patient has had recurrent falls 7. Wound Care to Follow at SNF 8. C/w Home BiPAP and O2  Home Health: No Equipment/Devices: None  Discharge Condition: Stable  CODE STATUS: FULL CODE Diet recommendation: Dysphagia 3 Diet with Thin Liquids  Brief/Interim Summary: The patient is an 83 y.o.malewith a past medical history of atrial fibrillation on anticoagulation with warfarin, history of COPD, history of OSA/OHSon BiPAP at night and O2 at daytime, chronic kidney disease stage III, history of gout on allopurinol, history of diabetes mellitus on insulin, history of chronic diastolic CHF with pulmonary hypertension, who lives in a SNF. Patient apparently had a fall the night prior to Admission. He rolled out of his bed and the injured his left forearm resulting in a skin tear and hurt his right wrist. He denies any head injuries. No headaches. While he was being evaluated in the emergency department, patient developed shortness of breath. A CT angiogram of the chest was done which did not show any PE. No interstitial edema was noted either. He was found to have abnormal UA and started on ceftriaxone and admitted for Sepsis 2/2 to UTI. UTI Grew out MRSA so he was transitioned to Vancomycin. Hospitalization was complicated by Hypotension. Cardiology was consulted for diuretic adjustment and patient improved. He was deemed medically stable to be transferred out of SDU on  03/08/17 and is improving. Palliative Care Medicine met with patient to discuss Goals of Care and family and patient elected to go back to SNF with Palliative as opposed to Hospice. Patient refused morning labs this AM but was deemed medically stable. He will be transitioned to po Nitrofurantoin for his UTI and will need close monitoring of his volume status and follow up with Cardiology at D/C.   Discharge Diagnoses:  Principal Problem:   Sepsis secondary to UTI [facility]) Active Problems:   Obesity hypoventilation syndrome (HCC)   ATRIAL FIBRILLATION   Chronic respiratory failure (HCC)   Chronic diastolic CHF (congestive heart failure) (HCC)   CLL (chronic lymphocytic leukemia) (HCC)   Diabetes mellitus with polyneuropathy (HCC)   Sepsis ([place])   Acute lower UTI   Palliative care encounter   Goals of care, counseling/discussion   Encounter for hospice care discussion  Sepsis due to UTI (recurrent), improved Urine culture growingMethicillin-resistant staph aureus, 70,000 colonies per mL.  -Patient was started on ceftriaxone , added vancomycin per pharmacy consultation..  -Patient did get Demadex 60 mg and he became hypotensive. -Demadex and Toprol-XL were currently on hold as patient's blood pressure was 77/45 a few days.  -WasStarted gentle IV hydration with normal saline at 100 ML per hour. Sepsis has stabilized, patient is off IV fluids and blood pressure is 136/65.  -Patient wast to be switched to Bactrim at the time of discharge but because of Coumadin Interaction was placed on po Nitrofurantoin.  -Palliative Care Met with Patient and patient and wife are interested in going to SNF with Palliative Care to follow rather  than Hospice Services as patient has had multiple admissions for the past 3 months -Will need to follow up with PCP   Fall resulting in skin tear of left forearm  -Patient denies passing out so CT Head not done -Left forearm in dressing, wound Care following  -Have  Wound Care follow at SNF  Right wrist pain -Improved after patient started on prednisone 40 mg by mouth daily -Will change prednisone to 30 mg daily from today. -Will be discharged on prednisone taper when medically ready.  Chronic kidney disease stage III -Patient's creatinine is slowly improving and went from 1.44 -> 1.29. -Repeat BMP at SNF   Chronic diastolic CHF  -Does not appear to be in acute CHF at this time. Euvolemic.  -He was started on IV fluids and is currently KVO. patient was on high-dose Demadex 60 mg twice a day at home. -Cardiology Consulted and recommended placing patient on Demadex 60 mg po Daily  -Will need to follow up with Cardiology as an outpatient -Can Resume Metoprolol at SNF  Hypokalemia -Patient's K+ was 3.1 yesterday AM and likely from Diuresis -Replete with po KCl 40 mEQ BID x 2 doses but patient refused AM labs -Continue to Monitor and Replete Potassium as Necessary -C/w Supplemental po 20 mEQ BID -Repeat CMP in AM at SNF  Diabetes Mellitus -Continue Home Insulin Regimen  -Continue to Monitor CBG's; CBG's ranging from 118-346 -Follow up with PCP as an outpatient   Chronic Hypoxic Respiratory Failure/OSA/OHS -Patient is on BiPAP at night along with home oxygen.  -Continue BiPAP and oxygen at SNF  History of Atrial Fibrillation -Patient is currently Anticoagulated with Warfarin. Continue Warfarin at Home dose -Repeat PT-INR in AM; PT-INR yesterday AM was 17.3-1.40 -C/w Telemetry  -Toprol XL on Hold due to Hypotension but can resume at D/C  History of CLL -Stable  -Followed by Oncology as outpatient. -Palliative Care Consulted and Recommending Palliative at SNF  Leukocytosis, Stable -Chronically elevated WBC, secondary to CLL. -WBC went from 20.1 -> 21.1 -Continue to Monitor and Repeat CBC in AM   Discharge Instructions  Discharge Instructions    (HEART FAILURE PATIENTS) Call MD:  Anytime you have any of the following  symptoms: 1) 3 pound weight gain in 24 hours or 5 pounds in 1 week 2) shortness of breath, with or without a dry hacking cough 3) swelling in the hands, feet or stomach 4) if you have to sleep on extra pillows at night in order to breathe.    Complete by:  As directed    Call MD for:  difficulty breathing, headache or visual disturbances    Complete by:  As directed    Call MD for:  extreme fatigue    Complete by:  As directed    Call MD for:  persistant dizziness or light-headedness    Complete by:  As directed    Call MD for:  persistant nausea and vomiting    Complete by:  As directed    Call MD for:  redness, tenderness, or signs of infection (pain, swelling, redness, odor or green/yellow discharge around incision site)    Complete by:  As directed    Call MD for:  severe uncontrolled pain    Complete by:  As directed    Call MD for:  temperature >100.4    Complete by:  As directed    Diet - low sodium heart healthy    Complete by:  As directed    Discharge instructions  Complete by:  As directed    Follow up Care at [facility]. Take all medications as prescribed. If symptoms change or worsen please return to the ED for evaluation.   Increase activity slowly    Complete by:  As directed      Allergies as of 03/09/2017      Reactions   Quinine Palpitations, Other (See Comments)   Seizure    Uloric [febuxostat] Rash      Medication List    STOP taking these medications   metolazone 2.5 MG tablet Commonly known as:  ZAROXOLYN     TAKE these medications   albuterol 108 (90 Base) MCG/ACT inhaler Commonly known as:  VENTOLIN HFA Inhale 2 puffs into the lungs every 6 (six) hours as needed for wheezing or shortness of breath.   allopurinol 100 MG tablet Commonly known as:  ZYLOPRIM Take 1 tablet (100 mg total) by mouth daily.   aluminum-magnesium hydroxide-simethicone 462-703-50 MG/5ML Susp Commonly known as:  MAALOX Take 30 mLs by mouth every 4 (four) hours as needed  (INDIGESTION OR HEARTBURN).   antiseptic oral rinse Liqd 15 mLs by Mouth Rinse route every 6 (six) hours as needed for dry mouth.   atorvastatin 10 MG tablet Commonly known as:  LIPITOR Take 1 tablet (10 mg total) by mouth daily at 6 PM.   bisacodyl 10 MG suppository Commonly known as:  DULCOLAX Place 10 mg rectally daily as needed for moderate constipation.   budesonide-formoterol 160-4.5 MCG/ACT inhaler Commonly known as:  SYMBICORT Inhale 2 puffs into the lungs 2 (two) times daily. 2 puffs twice daily   divalproex 250 MG DR tablet Commonly known as:  DEPAKOTE Take 250 mg by mouth 2 (two) times daily.   feeding supplement (PRO-STAT SUGAR FREE 64) Liqd Take 30 mLs by mouth daily.   HYDROcodone-acetaminophen 5-325 MG tablet Commonly known as:  NORCO/VICODIN Take 1 tablet by mouth every 8 (eight) hours as needed for moderate pain.   ipratropium-albuterol 0.5-2.5 (3) MG/3ML Soln Commonly known as:  DUONEB Take 3 mLs by nebulization every 6 (six) hours as needed (COPD).   LANTUS SOLOSTAR 100 UNIT/ML Solostar Pen Generic drug:  Insulin Glargine Inject 16 Units into the skin daily at 10 pm.   magnesium hydroxide 400 MG/5ML suspension Commonly known as:  MILK OF MAGNESIA Take 30 mLs by mouth every 4 (four) hours as needed for heartburn or indigestion.   Melatonin 3 MG Tabs Take 6 mg by mouth at bedtime as needed (sleep).   metoprolol succinate 25 MG 24 hr tablet Commonly known as:  TOPROL-XL Take 25 mg by mouth daily.   nitrofurantoin (macrocrystal-monohydrate) 100 MG capsule Commonly known as:  MACROBID Take 1 capsule (100 mg total) by mouth 2 (two) times daily.   nitroGLYCERIN 0.4 MG SL tablet Commonly known as:  NITROSTAT Place 1 tablet (0.4 mg total) under the tongue every 5 (five) minutes as needed for chest pain.   nystatin cream Commonly known as:  MYCOSTATIN Apply 1 application topically 2 (two) times daily. Apply to gluteal fold/buttocks   OXYGEN Inhale  3 L into the lungs continuous.   polyethylene glycol packet Commonly known as:  MIRALAX / GLYCOLAX Take 17 g by mouth daily.   potassium chloride SA 20 MEQ tablet Commonly known as:  K-DUR,KLOR-CON Take 1 tablet (20 mEq total) by mouth 2 (two) times daily. What changed:  how much to take   predniSONE 10 MG (21) Tbpk tablet Commonly known as:  STERAPRED UNI-PAK 21 TAB  Take 6 pills Day 1, 5 Pills Day 2, 4 Pills Day 3, 3 Pills Day 4, 2 Pills Day 5, 1 Pill Day 6, and Stop Day   RA SALINE ENEMA RE Place 1 Applicatorful rectally daily as needed (constipation).   senna-docusate 8.6-50 MG tablet Commonly known as:  Senokot-S Take 1 tablet by mouth at bedtime as needed for mild constipation.   tiotropium 18 MCG inhalation capsule Commonly known as:  SPIRIVA Place 1 capsule (18 mcg total) into inhaler and inhale daily.   torsemide 20 MG tablet Commonly known as:  DEMADEX Take 3 tablets (60 mg total) by mouth daily. Start taking on:  03/10/2017 What changed:  when to take this   warfarin 5 MG tablet Commonly known as:  COUMADIN Take 5 mg by mouth. Take Tue, Thu, Sat   warfarin 2.5 MG tablet Commonly known as:  COUMADIN Take 2.5 mg by mouth See admin instructions. M-W-F-Sun      Contact information for after-discharge care    Destination    [facility] .   Specialty:  [place] information: [address] [address] [phone]             Allergies  Allergen Reactions  . Quinine Palpitations and Other (See Comments)    Seizure   . Uloric [Febuxostat] Rash   Consultations:  Cardiology  Procedures/Studies: Dg Wrist 2 Views Right  Result Date: 03/04/2017 CLINICAL DATA:  Pain in right wrist due to gout per pt ; no recent injury; pt id checked with RN EXAM: RIGHT WRIST - 2 VIEW COMPARISON:  12/29/2016, 12/30/2016 MRI FINDINGS: There is no acute fracture or dislocation. There are well  corticated erosions in the interphalangeal joint of the thumb and the metacarpal phalangeal joints, consistent with the history of gout. Bone mineral density is normal. There is atherosclerotic calcification of the small arteries. Soft tissue swelling of the thumb. Joint space narrowing and corticated erosions of the first carpometacarpal joint. IMPRESSION: 1.  No evidence for acute  abnormality. 2. Findings are compatible with gout arthropathy and appear stable. Electronically Signed   By: Nolon Nations M.D.   On: 03/04/2017 11:21   Ct Angio Chest Pe W/cm &/or Wo Cm  Result Date: 03/04/2017 CLINICAL DATA:  Acute onset of generalized chest pain and shortness of breath. Tachycardia. Decreased O2 saturation. Initial encounter. EXAM: CT ANGIOGRAPHY CHEST WITH CONTRAST TECHNIQUE: Multidetector CT imaging of the chest was performed using the standard protocol during bolus administration of intravenous contrast. Multiplanar CT image reconstructions and MIPs were obtained to evaluate the vascular anatomy. CONTRAST:  80 mL of Isovue 370 IV contrast COMPARISON:  Chest radiograph performed earlier today at 2:33 a.m., and CT of the chest performed 09/19/2016 FINDINGS: Cardiovascular:  There is no evidence of pulmonary embolus. The heart is borderline normal in size. Diffuse coronary artery calcifications are seen. Scattered calcification is noted along the thoracic aorta and proximal great vessels. Mediastinum/Nodes: Scattered enlarged mediastinal and left hilar nodes are seen, measuring up to 1.9 cm in short axis, particularly at the right paratracheal region and azygoesophageal recess. No pericardial effusion is identified. The thyroid gland is unremarkable. No axillary lymphadenopathy is appreciated. Lungs/Pleura: Mild scattered atelectasis is noted bilaterally. The lungs are difficult to fully assess due to motion artifact. No pleural effusion or pneumothorax is seen. No masses are identified. Upper Abdomen: The  visualized portions of the liver and spleen are grossly unremarkable. Stones are noted dependently within  the gallbladder. The gallbladder is mildly distended but otherwise unremarkable. The visualized portions of the pancreas and adrenal glands are within normal limits. Nonspecific perinephric stranding is noted bilaterally. Musculoskeletal: No acute osseous abnormalities are identified. The visualized musculature is unremarkable in appearance. Review of the MIP images confirms the above findings. IMPRESSION: 1. No evidence of pulmonary embolus. 2. Mild scattered atelectasis noted bilaterally. 3. Enlarged mediastinal and left hilar nodes, measuring up to 1.9 cm in short axis. These are persistent from the prior study, and could reflect underlying lymphoma or inflammatory disease. Several nodes would be amenable to biopsy, as deemed clinically appropriate. 4. Diffuse coronary artery calcifications seen. 5. Cholelithiasis. Gallbladder mildly distended but otherwise unremarkable. Electronically Signed   By: Garald Balding M.D.   On: 03/04/2017 05:24   Dg Chest Portable 1 View  Result Date: 03/04/2017 CLINICAL DATA:  Status post fall out of bed, with shortness of breath and right shoulder pain. Initial encounter. EXAM: PORTABLE CHEST 1 VIEW COMPARISON:  Chest radiograph performed 01/02/2017 FINDINGS: There is mild elevation of the right hemidiaphragm. Vascular congestion is noted. Mild interstitial prominence may reflect transient interstitial edema. No pneumothorax is seen. The cardiomediastinal silhouette is normal in size. No acute osseous abnormalities are identified. IMPRESSION: Mild elevation of the right hemidiaphragm. Vascular congestion noted. Mild interstitial prominence may reflect transient interstitial edema. No displaced rib fracture seen. Electronically Signed   By: Garald Balding M.D.   On: 03/04/2017 02:58     Subjective: Seen and examined at bedside and was doing better. Did not want to have  blood draws. No nausea or vomiting. Denied any other concerns and ready to go back to SNF.   Discharge Exam: Vitals:   03/09/17 0541 03/09/17 0844  BP: 136/65   Pulse: 65 78  Resp: 18 18  Temp: 98.5 F (36.9 C)    Vitals:   03/08/17 2202 03/09/17 0304 03/09/17 0541 03/09/17 0844  BP: 100/75  136/65   Pulse: 80  65 78  Resp: 20  18 18   Temp:   98.5 F (36.9 C)   TempSrc:   Axillary   SpO2: 95% 99% 97% 98%  Weight:      Height:       General: Pt is alert, awake, not in acute distress Cardiovascular: Irregularly Irregular, Systolic Murmur heard B7/C4 +, no rubs, no gallops Respiratory: Diminished bilaterally, no wheezing, no rhonchi Abdominal: Soft, NT, ND, bowel sounds + Extremities: no edema, no cyanosis; Left Arm wrapped  The results of significant diagnostics from this hospitalization (including imaging, microbiology, ancillary and laboratory) are listed below for reference.    Microbiology: Recent Results (from the past 240 hour(s))  Culture, blood (routine x 2)     Status: None (Preliminary result)   Collection Time: 03/04/17  2:56 AM  Result Value Ref Range Status   Specimen Description BLOOD RIGHT ANTECUBITAL  Final   Special Requests   Final    BOTTLES DRAWN AEROBIC AND ANAEROBIC Blood Culture adequate volume   Culture   Final    NO GROWTH 4 DAYS Performed at [facility], [address], [place], [place] [address]    Report Status PENDING  Incomplete  Culture, blood (routine x 2)     Status: None (Preliminary result)   Collection Time: 03/04/17  4:02 AM  Result Value Ref Range Status   Specimen Description BLOOD RIGHT ANTECUBITAL  Final   Special Requests   Final    BOTTLES DRAWN AEROBIC AND ANAEROBIC Blood  Culture adequate volume   Culture   Final    NO GROWTH 4 DAYS Performed at [facility], [place] [address], [place], [place] [address]    Report Status PENDING  Incomplete  Urine culture     Status: Abnormal   Collection Time: 03/04/17  4:51  AM  Result Value Ref Range Status   Specimen Description URINE, RANDOM  Final   Special Requests NONE  Final   Culture (A)  Final    70,000 COLONIES/mL METHICILLIN RESISTANT STAPHYLOCOCCUS AUREUS   Report Status 03/06/2017 FINAL  Final   Organism ID, Bacteria METHICILLIN RESISTANT STAPHYLOCOCCUS AUREUS (A)  Final      Susceptibility   Methicillin resistant staphylococcus aureus - MIC*    CIPROFLOXACIN >=8 RESISTANT Resistant     GENTAMICIN <=0.5 SENSITIVE Sensitive     NITROFURANTOIN <=16 SENSITIVE Sensitive     OXACILLIN >=4 RESISTANT Resistant     TETRACYCLINE <=1 SENSITIVE Sensitive     VANCOMYCIN <=0.5 SENSITIVE Sensitive     TRIMETH/SULFA <=10 SENSITIVE Sensitive     CLINDAMYCIN <=0.25 SENSITIVE Sensitive     RIFAMPIN <=0.5 SENSITIVE Sensitive     Inducible Clindamycin NEGATIVE Sensitive     * 70,000 COLONIES/mL METHICILLIN RESISTANT STAPHYLOCOCCUS AUREUS  MRSA PCR Screening     Status: None   Collection Time: 03/04/17  8:46 AM  Result Value Ref Range Status   MRSA by PCR NEGATIVE NEGATIVE Final    Comment:        The GeneXpert MRSA Assay (FDA approved for NASAL specimens only), is one component of a comprehensive MRSA colonization surveillance program. It is not intended to diagnose MRSA infection nor to guide or monitor treatment for MRSA infections.     Labs: BNP (last 3 results)  Recent Labs  11/14/16 0353 12/26/16 1449 12/28/16 1955  BNP 89.0 112.8* 616.0*   Basic Metabolic Panel:  Recent Labs Lab 03/04/17 0250 03/05/17 0345 03/06/17 0303 03/06/17 0757 03/07/17 0338 03/08/17 0528 03/08/17 1003  NA 140 136  --  137 136  --  139  K 4.6 4.3  --  3.2* 4.2  --  3.1*  CL 94* 94*  --  97* 96*  --  94*  CO2 32 32  --  28 32  --  38*  GLUCOSE 170* 320*  --  137* 242*  --  237*  BUN 46* 48*  --  50* 48*  --  40*  CREATININE 1.79* 1.67* 1.66* 1.48* 1.44* 1.37* 1.29*  CALCIUM 9.1 8.5*  --  8.3* 8.3*  --  8.5*  MG  --   --   --   --   --   --  2.1  PHOS   --   --   --   --   --   --  2.5   Liver Function Tests:  Recent Labs Lab 03/04/17 0250 03/08/17 1003  AST 22 41  ALT 10* 33  ALKPHOS 71 63  BILITOT 0.8 0.8  PROT 7.7 6.6  ALBUMIN 2.7* 2.6*   No results for input(s): LIPASE, AMYLASE in the last 168 hours. No results for input(s): AMMONIA in the last 168 hours. CBC:  Recent Labs Lab 03/04/17 0250 03/05/17 0345 03/06/17 0757 03/08/17 1003  WBC 20.7* 15.1* 20.1* 21.1*  NEUTROABS 11.1*  --   --  11.8*  HGB 14.2 12.3* 11.6* 13.0  HCT 42.7 37.9* 34.9* 39.1  MCV 93.2 91.8 90.9 91.1  PLT 211 203 187 188  Cardiac Enzymes:  Recent Labs Lab 03/04/17 0250 03/04/17 1213 03/04/17 1821  TROPONINI 0.03* 0.04* 0.03*   BNP: Invalid input(s): POCBNP CBG:  Recent Labs Lab 03/08/17 1221 03/08/17 1704 03/08/17 2231 03/09/17 0753 03/09/17 1216  GLUCAP 235* 322* 346* 118* 181*   D-Dimer No results for input(s): DDIMER in the last 72 hours. Hgb A1c No results for input(s): HGBA1C in the last 72 hours. Lipid Profile No results for input(s): CHOL, HDL, LDLCALC, TRIG, CHOLHDL, LDLDIRECT in the last 72 hours. Thyroid function studies No results for input(s): TSH, T4TOTAL, T3FREE, THYROIDAB in the last 72 hours.  Invalid input(s): FREET3 Anemia work up No results for input(s): VITAMINB12, FOLATE, FERRITIN, TIBC, IRON, RETICCTPCT in the last 72 hours. Urinalysis    Component Value Date/Time   COLORURINE YELLOW 03/04/2017 0451   APPEARANCEUR HAZY (A) 03/04/2017 0451   LABSPEC 1.008 03/04/2017 0451   PHURINE 5.0 03/04/2017 0451   GLUCOSEU NEGATIVE 03/04/2017 0451   HGBUR SMALL (A) 03/04/2017 0451   BILIRUBINUR NEGATIVE 03/04/2017 0451   KETONESUR NEGATIVE 03/04/2017 0451   PROTEINUR NEGATIVE 03/04/2017 0451   UROBILINOGEN 0.2 09/18/2010 1120   NITRITE NEGATIVE 03/04/2017 0451   LEUKOCYTESUR LARGE (A) 03/04/2017 0451   Sepsis Labs Invalid input(s): PROCALCITONIN,  WBC,  LACTICIDVEN Microbiology Recent Results  (from the past 240 hour(s))  Culture, blood (routine x 2)     Status: None (Preliminary result)   Collection Time: 03/04/17  2:56 AM  Result Value Ref Range Status   Specimen Description BLOOD RIGHT ANTECUBITAL  Final   Special Requests   Final    BOTTLES DRAWN AEROBIC AND ANAEROBIC Blood Culture adequate volume   Culture   Final    NO GROWTH 4 DAYS Performed at [facility], [place] [address] [place], [place] [address]    Report Status PENDING  Incomplete  Culture, blood (routine x 2)     Status: None (Preliminary result)   Collection Time: 03/04/17  4:02 AM  Result Value Ref Range Status   Specimen Description BLOOD RIGHT ANTECUBITAL  Final   Special Requests   Final    BOTTLES DRAWN AEROBIC AND ANAEROBIC Blood Culture adequate volume   Culture   Final    NO GROWTH 4 DAYS Performed at [facility], [place] [address] [place], [place] [address]    Report Status PENDING  Incomplete  Urine culture     Status: Abnormal   Collection Time: 03/04/17  4:51 AM  Result Value Ref Range Status   Specimen Description URINE, RANDOM  Final   Special Requests NONE  Final   Culture (A)  Final    70,000 COLONIES/mL METHICILLIN RESISTANT STAPHYLOCOCCUS AUREUS   Report Status 03/06/2017 FINAL  Final   Organism ID, Bacteria METHICILLIN RESISTANT STAPHYLOCOCCUS AUREUS (A)  Final      Susceptibility   Methicillin resistant staphylococcus aureus - MIC*    CIPROFLOXACIN >=8 RESISTANT Resistant     GENTAMICIN <=0.5 SENSITIVE Sensitive     NITROFURANTOIN <=16 SENSITIVE Sensitive     OXACILLIN >=4 RESISTANT Resistant     TETRACYCLINE <=1 SENSITIVE Sensitive     VANCOMYCIN <=0.5 SENSITIVE Sensitive     TRIMETH/SULFA <=10 SENSITIVE Sensitive     CLINDAMYCIN <=0.25 SENSITIVE Sensitive     RIFAMPIN <=0.5 SENSITIVE Sensitive     Inducible Clindamycin NEGATIVE Sensitive     * 70,000 COLONIES/mL METHICILLIN RESISTANT STAPHYLOCOCCUS AUREUS  MRSA PCR Screening     Status: None   Collection Time:  03/04/17  8:46 AM  Result Value Ref Range Status   MRSA by PCR NEGATIVE NEGATIVE Final    Comment:        The GeneXpert MRSA Assay (FDA approved for NASAL specimens only), is one component of a comprehensive MRSA colonization surveillance program. It is not intended to diagnose MRSA infection nor to guide or monitor treatment for MRSA infections.    Time coordinating discharge: 35 minutes  SIGNED:  Kerney Elbe, DO [facility] 03/09/2017, 12:22 PM Pager [phone]  If 7PM-7AM, please contact night-coverage [email or web address] Password [email or web address]

## 2017-03-09 NOTE — Progress Notes (Signed)
[facility] for warfarin Indication: hx atrial fibrillation  Allergies  Allergen Reactions  . Quinine Palpitations and Other (See Comments)    Seizure   . Uloric [Febuxostat] Rash    Patient Measurements: Height: 6\' 2"  (188 cm) Weight: 253 lb 4.9 oz (114.9 kg) IBW/kg (Calculated) : 82.2 Heparin Dosing Weight:   Vital Signs: Temp: 98.5 F (36.9 C) (06/21 0541) Temp Source: Axillary (06/21 0541) BP: 136/65 (06/21 0541) Pulse Rate: 78 (06/21 0844)  Labs:  Recent Labs  03/07/17 0338 03/08/17 0528 03/08/17 1003  HGB  --   --  13.0  HCT  --   --  39.1  PLT  --   --  188  LABPROT 20.6* 17.3*  --   INR 1.75 1.40  --   CREATININE 1.44* 1.37* 1.29*    Estimated Creatinine Clearance: 60.5 mL/min (A) (by C-G formula based on SCr of 1.29 mg/dL (H)).   Medications:  Home warfarin regimen: 5 mg daily except 2.5 mg on MWF  Assessment: Patient's an 83 y.o M with hx CVA and afib on warfarin PTA presented to the ED on 03/04/17 s/p fall at Rehab facility with left forearm skin tear.  Warfarin resumed on admission.   Today, 03/09/2017: - pt refusing labs draw, INR subtherapeutic yesterday - hgb and plt trending down slightly (6/20) - no bleeding documented - on dysphagia 3 diet - drug-drug intxns:started bactrim po>can make patient more sensitive to warfarin  Goal of Therapy:  INR 2-3 Monitor platelets by anticoagulation protocol: Yes   Plan:  1) due to no INR and starting po bactrim will give reduced dose of warfarin 2.5mg  po x 1 2) Monitor for s/s bleeding 3) Daily INR   Dolly Rias RPh 03/09/2017, 11:13 AM Pager [phone]

## 2017-03-09 NOTE — Progress Notes (Signed)
LCSW following for discharge planning/disposition:  Pt returning to [facility] today.  Patient will transport by [place] completed medical necessity form and will call for transport. Family notified regarding discharge - wife at bedside All information sent to facility by [place] Report number for RN:  [phone]  No other needs at this time   Plan: DC to [facility], MSW, [place] [employer] 03/09/2017 [phone]

## 2017-03-10 ENCOUNTER — Non-Acute Institutional Stay (SKILLED_NURSING_FACILITY): Payer: PPO | Admitting: Adult Health

## 2017-03-10 ENCOUNTER — Encounter: Payer: Self-pay | Admitting: Adult Health

## 2017-03-10 ENCOUNTER — Telehealth: Payer: Self-pay

## 2017-03-10 DIAGNOSIS — I482 Chronic atrial fibrillation, unspecified: Secondary | ICD-10-CM

## 2017-03-10 DIAGNOSIS — N183 Chronic kidney disease, stage 3 unspecified: Secondary | ICD-10-CM

## 2017-03-10 DIAGNOSIS — J9611 Chronic respiratory failure with hypoxia: Secondary | ICD-10-CM

## 2017-03-10 DIAGNOSIS — N390 Urinary tract infection, site not specified: Secondary | ICD-10-CM

## 2017-03-10 DIAGNOSIS — M1A0410 Idiopathic chronic gout, right hand, without tophus (tophi): Secondary | ICD-10-CM | POA: Diagnosis not present

## 2017-03-10 DIAGNOSIS — I10 Essential (primary) hypertension: Secondary | ICD-10-CM | POA: Diagnosis not present

## 2017-03-10 DIAGNOSIS — I5032 Chronic diastolic (congestive) heart failure: Secondary | ICD-10-CM

## 2017-03-10 DIAGNOSIS — R531 Weakness: Secondary | ICD-10-CM

## 2017-03-10 DIAGNOSIS — E876 Hypokalemia: Secondary | ICD-10-CM | POA: Diagnosis not present

## 2017-03-10 DIAGNOSIS — F39 Unspecified mood [affective] disorder: Secondary | ICD-10-CM

## 2017-03-10 DIAGNOSIS — G4700 Insomnia, unspecified: Secondary | ICD-10-CM

## 2017-03-10 DIAGNOSIS — E1142 Type 2 diabetes mellitus with diabetic polyneuropathy: Secondary | ICD-10-CM

## 2017-03-10 DIAGNOSIS — J438 Other emphysema: Secondary | ICD-10-CM

## 2017-03-10 DIAGNOSIS — G4733 Obstructive sleep apnea (adult) (pediatric): Secondary | ICD-10-CM | POA: Diagnosis not present

## 2017-03-10 DIAGNOSIS — C9190 Lymphoid leukemia, unspecified not having achieved remission: Secondary | ICD-10-CM

## 2017-03-10 DIAGNOSIS — C9110 Chronic lymphocytic leukemia of B-cell type not having achieved remission: Secondary | ICD-10-CM

## 2017-03-10 NOTE — Progress Notes (Signed)
DATE:  03/10/2017   MRN:  [id number]  BIRTHDAY: August 15, 1933  Facility:  [facility] Home Location:  [facility] Room Number: 03/09/17  LEVEL OF CARE:  SNF (31)  Contact Information    Name Relation Home Work Mobile   Tassin,Helen Spouse [phone]  [phone]   [place] Son [phone]         Code Status History    Date Active Date Inactive Code Status Order ID Comments User Context   03/04/2017  8:53 AM 03/09/2017  7:01 PM DNR [id number]  Bonnielee Haff, MD Inpatient   12/29/2016  2:15 AM 01/03/2017  8:09 PM DNR [id number]  Rise Patience, MD Inpatient   10/30/2016  2:19 AM 11/07/2016  5:08 PM Full Code [id number]  Rexanne Mano, MD ED   09/14/2016 10:33 PM 09/23/2016  9:40 PM Full Code [id number]  Ivor Costa, MD ED    Questions for Most Recent Historical Code Status (Order [id number])    Question Answer Comment   In the event of cardiac or respiratory ARREST Do not call a "code blue"    In the event of cardiac or respiratory ARREST Do not perform Intubation, CPR, defibrillation or ACLS    In the event of cardiac or respiratory ARREST Use medication by any route, position, wound care, and other measures to relive pain and suffering. May use oxygen, suction and manual treatment of airway obstruction as needed for comfort.        Chief Complaint  Patient presents with  . Hospitalization Follow-up    HFU    HISTORY OF PRESENT ILLNESS:  This is an 80-YO male seen for hospital followup.  He was readmitted to [facility] on 03/09/17 for long-term care from [employer] [facility] [employer] [place] admission dates 03/04/17 thru 03/09/17 S/P fall sustaining skin tears on his left forearm. He developed SOB while in ED. CT angiogram  Of chest was negative for PE. He was found to have MRSA UTI and was started on Ceftriaxone and Vancomycin. He was then transitioned to Nitrofurantoin. He had hypotension and cardiology was consulted for diuretic  adjustment. Palliative Care discussed goals of care with patient and family.  He was seen in his room today after finishing therapy.  PAST MEDICAL HISTORY:  Past Medical History:  Diagnosis Date  . Atrial fibrillation ([place])   . Cerebrovascular disease   . CHF (congestive heart failure) ([place])   . CLL (chronic lymphocytic leukemia) ([place]) 11/17/2014  . COPD (chronic obstructive pulmonary disease) ([place])   . History of thrombocytopenia   . Obesity   . Osteoarthritis   . Other and unspecified hyperlipidemia   . Type II or unspecified type diabetes mellitus without mention of complication, not stated as uncontrolled   . Unspecified essential hypertension      CURRENT MEDICATIONS: Reviewed  Patient's Medications  New Prescriptions   No medications on file  Previous Medications   ALBUTEROL (VENTOLIN HFA) 108 (90 BASE) MCG/ACT INHALER    Inhale 2 puffs into the lungs every 6 (six) hours as needed for wheezing or shortness of breath.   ALLOPURINOL (ZYLOPRIM) 100 MG TABLET    Take 1 tablet (100 mg total) by mouth daily.   ALUMINUM-MAGNESIUM HYDROXIDE-SIMETHICONE (MAALOX) 270-623-76 MG/5ML SUSP    Take 30 mLs by mouth every 4 (four) hours as needed (INDIGESTION OR HEARTBURN).   AMINO ACIDS-PROTEIN HYDROLYS (FEEDING SUPPLEMENT, PRO-STAT SUGAR FREE 64,) LIQD    Take 30 mLs by mouth daily.   ANTISEPTIC  ORAL RINSE (BIOTENE) LIQD    15 mLs by Mouth Rinse route every 6 (six) hours as needed for dry mouth.   ATORVASTATIN (LIPITOR) 10 MG TABLET    Take 1 tablet (10 mg total) by mouth daily at 6 PM.   BISACODYL (DULCOLAX) 10 MG SUPPOSITORY    Place 10 mg rectally daily as needed for moderate constipation.   BUDESONIDE-FORMOTEROL (SYMBICORT) 160-4.5 MCG/ACT INHALER    Inhale 2 puffs into the lungs 2 (two) times daily. 2 puffs twice daily   DIVALPROEX (DEPAKOTE) 250 MG DR TABLET    Take 250 mg by mouth 2 (two) times daily.   HYDROCODONE-ACETAMINOPHEN (NORCO/VICODIN) 5-325 MG TABLET    Take 1 tablet by  mouth every 8 (eight) hours as needed for moderate pain.   INSULIN GLARGINE (LANTUS SOLOSTAR) 100 UNIT/ML SOLOSTAR PEN    Inject 16 Units into the skin daily at 10 pm.   IPRATROPIUM-ALBUTEROL (DUONEB) 0.5-2.5 (3) MG/3ML SOLN    Take 3 mLs by nebulization every 6 (six) hours as needed (COPD).   MAGNESIUM HYDROXIDE (MILK OF MAGNESIA) 400 MG/5ML SUSPENSION    Take 30 mLs by mouth every 4 (four) hours as needed for heartburn or indigestion.    MELATONIN 3 MG TABS    Take 6 mg by mouth at bedtime as needed (sleep).    METOPROLOL SUCCINATE (TOPROL-XL) 25 MG 24 HR TABLET    Take 25 mg by mouth daily.    NITROFURANTOIN, MACROCRYSTAL-MONOHYDRATE, (MACROBID) 100 MG CAPSULE    Take 1 capsule (100 mg total) by mouth 2 (two) times daily.   NITROGLYCERIN (NITROSTAT) 0.4 MG SL TABLET    Place 1 tablet (0.4 mg total) under the tongue every 5 (five) minutes as needed for chest pain.   NYSTATIN CREAM (MYCOSTATIN)    Apply 1 application topically 2 (two) times daily. Apply to gluteal fold/buttocks   OXYGEN    Inhale 3 L into the lungs continuous.   POLYETHYLENE GLYCOL (MIRALAX / GLYCOLAX) PACKET    Take 17 g by mouth daily.   POTASSIUM CHLORIDE SA (K-DUR,KLOR-CON) 20 MEQ TABLET    Take 1 tablet (20 mEq total) by mouth 2 (two) times daily.   PREDNISONE (STERAPRED UNI-PAK 21 TAB) 10 MG (21) TBPK TABLET    Take 6 pills Day 1, 5 Pills Day 2, 4 Pills Day 3, 3 Pills Day 4, 2 Pills Day 5, 1 Pill Day 6, and Stop Day   SENNA-DOCUSATE (SENOKOT-S) 8.6-50 MG TABLET    Take 1 tablet by mouth at bedtime as needed for mild constipation.   SODIUM PHOSPHATES (RA SALINE ENEMA RE)    Place 1 Applicatorful rectally daily as needed (constipation).   TIOTROPIUM (SPIRIVA) 18 MCG INHALATION CAPSULE    Place 1 capsule (18 mcg total) into inhaler and inhale daily.   TORSEMIDE (DEMADEX) 20 MG TABLET    Take 3 tablets (60 mg total) by mouth daily.   WARFARIN (COUMADIN) 2.5 MG TABLET    Take 2.5 mg by mouth See admin instructions. M-W-F-Sun     WARFARIN (COUMADIN) 5 MG TABLET    Take 5 mg by mouth. Take Tue, Thu, Sat  Modified Medications   No medications on file  Discontinued Medications   No medications on file     Allergies  Allergen Reactions  . Quinine Palpitations and Other (See Comments)    Seizure   . Uloric [Febuxostat] Rash     REVIEW OF SYSTEMS:  GENERAL: no change in appetite, no fatigue, no weight  changes, no fever, chills or weakness EYES: Denies change in vision, dry eyes, eye pain, itching or discharge EARS: Denies change in hearing, ringing in ears, or earache NOSE: Denies nasal congestion or epistaxis MOUTH and THROAT: Denies oral discomfort, gingival pain or bleeding, pain from teeth or hoarseness   RESPIRATORY: no cough, SOB, DOE, wheezing, hemoptysis CARDIAC: no chest pain, edema or palpitations GI: no abdominal pain, diarrhea, constipation, heart burn, nausea or vomiting GU: Denies dysuria, frequency, hematuria, incontinence, or discharge PSYCHIATRIC: Denies feeling of depression or anxiety. No report of hallucinations, insomnia, paranoia, or agitation   PHYSICAL EXAMINATION  GENERAL APPEARANCE: Well nourished. In no acute distress. Obese the or  SKIN:  Skin tears on left forearm, black scabs on right 2nd and 3rd toes and left 5th toe, scabs on bilateral shin which is discolored, right knee with scab HEAD: Normal in size and contour. No evidence of trauma EYES: Lids open and close normally. No blepharitis, entropion or ectropion. PERRL. Conjunctivae are clear and sclerae are white. Lenses are without opacity EARS: Pinnae are normal. Patient hears normal voice tunes of the examiner MOUTH and THROAT: Lips are without lesions. Oral mucosa is moist and without lesions. Tongue is normal in shape, size, and color and without lesions NECK: supple, trachea midline, no neck masses, no thyroid tenderness, no thyromegaly LYMPHATICS: no LAN in the neck, no supraclavicular LAN RESPIRATORY: breathing is even  & unlabored, BS CTAB, has O2 @ 3L/min via [place] continuously CARDIAC: Irregular heart rate, no murmur,no extra heart sounds, no edema GI: abdomen soft, normal BS, no masses, no tenderness, no hepatomegaly, no splenomegaly EXTREMITIES:  Able to move X 4 extremities, BLE has generalized weakness PSYCHIATRIC: Alert to self and disoriented to time and place. Affect and behavior are appropriate   LABS/RADIOLOGY: Labs reviewed: Basic Metabolic Panel:  Recent Labs  10/30/16 0238  12/31/16 0322  01/03/17 0404  03/06/17 0757 03/07/17 0338 03/08/17 0528 03/08/17 1003  NA 134*  < > 135  < > 140  < > 137 136  --  139  K 3.8  < > 4.0  < > 3.4*  < > 3.2* 4.2  --  3.1*  CL 83*  < > 88*  < > 86*  < > 97* 96*  --  94*  CO2 33*  < > 36*  < > 44*  < > 28 32  --  38*  GLUCOSE 376*  < > 276*  < > 173*  < > 137* 242*  --  237*  BUN 105*  < > 60*  < > 57*  < > 50* 48*  --  40*  CREATININE 2.21*  < > 1.66*  < > 1.28*  < > 1.48* 1.44* 1.37* 1.29*  CALCIUM 8.5*  < > 8.5*  < > 8.9  < > 8.3* 8.3*  --  8.5*  MG 1.9  < > 2.6*  --  2.3  --   --   --   --  2.1  PHOS 4.6  --   --   --   --   --   --   --   --  2.5  < > = values in this interval not displayed. Liver Function Tests:  Recent Labs  02/23/17 1313 03/04/17 0250 03/08/17 1003  AST 29 22 41  ALT 11 10* 33  ALKPHOS 72 71 63  BILITOT 0.70 0.8 0.8  PROT 7.1 7.7 6.6  ALBUMIN 2.5* 2.7* 2.6*    Recent  Labs  10/30/16 0903  AMMONIA 19   CBC:  Recent Labs  12/31/16 0322  03/04/17 0250 03/05/17 0345 03/06/17 0757 03/08/17 1003  WBC 23.8*  < > 20.7* 15.1* 20.1* 21.1*  NEUTROABS 14.0*  --  11.1*  --   --  11.8*  HGB 12.2*  < > 14.2 12.3* 11.6* 13.0  HCT 37.0*  < > 42.7 37.9* 34.9* 39.1  MCV 92.0  < > 93.2 91.8 90.9 91.1  PLT 172  < > 211 203 187 188  < > = values in this interval not displayed. Lipid Panel:  Recent Labs  09/15/16 0434  HDL 23*   Cardiac Enzymes:  Recent Labs  03/04/17 0250 03/04/17 1213 03/04/17 1821    TROPONINI 0.03* 0.04* 0.03*   CBG:  Recent Labs  03/08/17 2231 03/09/17 0753 03/09/17 1216  GLUCAP 346* 118* 181*      Dg Wrist 2 Views Right  Result Date: 03/04/2017 CLINICAL DATA:  Pain in right wrist due to gout per pt ; no recent injury; pt id checked with RN EXAM: RIGHT WRIST - 2 VIEW COMPARISON:  12/29/2016, 12/30/2016 MRI FINDINGS: There is no acute fracture or dislocation. There are well corticated erosions in the interphalangeal joint of the thumb and the metacarpal phalangeal joints, consistent with the history of gout. Bone mineral density is normal. There is atherosclerotic calcification of the small arteries. Soft tissue swelling of the thumb. Joint space narrowing and corticated erosions of the first carpometacarpal joint. IMPRESSION: 1.  No evidence for acute  abnormality. 2. Findings are compatible with gout arthropathy and appear stable. Electronically Signed   By: Nolon Nations M.D.   On: 03/04/2017 11:21   Ct Angio Chest Pe W/cm &/or Wo Cm  Result Date: 03/04/2017 CLINICAL DATA:  Acute onset of generalized chest pain and shortness of breath. Tachycardia. Decreased O2 saturation. Initial encounter. EXAM: CT ANGIOGRAPHY CHEST WITH CONTRAST TECHNIQUE: Multidetector CT imaging of the chest was performed using the standard protocol during bolus administration of intravenous contrast. Multiplanar CT image reconstructions and MIPs were obtained to evaluate the vascular anatomy. CONTRAST:  80 mL of Isovue 370 IV contrast COMPARISON:  Chest radiograph performed earlier today at 2:33 a.m., and CT of the chest performed 09/19/2016 FINDINGS: Cardiovascular:  There is no evidence of pulmonary embolus. The heart is borderline normal in size. Diffuse coronary artery calcifications are seen. Scattered calcification is noted along the thoracic aorta and proximal great vessels. Mediastinum/Nodes: Scattered enlarged mediastinal and left hilar nodes are seen, measuring up to 1.9 cm in short  axis, particularly at the right paratracheal region and azygoesophageal recess. No pericardial effusion is identified. The thyroid gland is unremarkable. No axillary lymphadenopathy is appreciated. Lungs/Pleura: Mild scattered atelectasis is noted bilaterally. The lungs are difficult to fully assess due to motion artifact. No pleural effusion or pneumothorax is seen. No masses are identified. Upper Abdomen: The visualized portions of the liver and spleen are grossly unremarkable. Stones are noted dependently within the gallbladder. The gallbladder is mildly distended but otherwise unremarkable. The visualized portions of the pancreas and adrenal glands are within normal limits. Nonspecific perinephric stranding is noted bilaterally. Musculoskeletal: No acute osseous abnormalities are identified. The visualized musculature is unremarkable in appearance. Review of the MIP images confirms the above findings. IMPRESSION: 1. No evidence of pulmonary embolus. 2. Mild scattered atelectasis noted bilaterally. 3. Enlarged mediastinal and left hilar nodes, measuring up to 1.9 cm in short axis. These are persistent from the prior study, and could  reflect underlying lymphoma or inflammatory disease. Several nodes would be amenable to biopsy, as deemed clinically appropriate. 4. Diffuse coronary artery calcifications seen. 5. Cholelithiasis. Gallbladder mildly distended but otherwise unremarkable. Electronically Signed   By: Garald Balding M.D.   On: 03/04/2017 05:24   Dg Chest Portable 1 View  Result Date: 03/04/2017 CLINICAL DATA:  Status post fall out of bed, with shortness of breath and right shoulder pain. Initial encounter. EXAM: PORTABLE CHEST 1 VIEW COMPARISON:  Chest radiograph performed 01/02/2017 FINDINGS: There is mild elevation of the right hemidiaphragm. Vascular congestion is noted. Mild interstitial prominence may reflect transient interstitial edema. No pneumothorax is seen. The cardiomediastinal silhouette  is normal in size. No acute osseous abnormalities are identified. IMPRESSION: Mild elevation of the right hemidiaphragm. Vascular congestion noted. Mild interstitial prominence may reflect transient interstitial edema. No displaced rib fracture seen. Electronically Signed   By: Garald Balding M.D.   On: 03/04/2017 02:58    ASSESSMENT/PLAN:  1. Generalized weakness - for rehabilitation, for PT and OT, for therapeutic strengthening exercises; fall precautions   2. Complicated UTI (urinary tract infection) - found to have MRSA UTI and wa s started on Ceftriaxone and Vancomycin. He was then transitioned to Nitrofurantoin 100 mg 1 capsule by mouth twice a day till 03/14/17   3. Chronic gout of right hand, unspecified cause - continue prednisone taper, allopurinol 100 mg 1 tab by mouth daily and Norco 5/325 mg 1 tab by mouth every 8 hours when necessary for pain   4. Chronic atrial fibrillation (HCC) - rate controlled; continue metoprolol succinate ER 25 mg 1 tab by mouth daily and Coumadin    5. Essential hypertension - continue metoprolol succinate ER 25 mg 1 tab by mouth daily, BP/HR BID X 1 week   6. Chronic kidney disease (CKD), stage III (moderate) -  Check BMP in 1 week Lab Results  Component Value Date   CREATININE 1.29 (H) 03/08/2017    7. Chronic diastolic CHF (congestive heart failure) (HCC) - continue O2 @ 3L/min via [place] continuously, fluid restrictions of 1800 mL/day, metoprolol succinate ER 25 mg 1 tab by mouth daily and torsemide 20 mg 3 tabs = 60 mg by mouth daily   8. Hypokalemia - continue KCl ER 20 meq 2 tabs = 40 meq by mouth twice a day, check BMP on 03/13/17 Lab Results  Component Value Date   K 3.1 (L) 03/08/2017     9. Diabetic polyneuropathy associated with type 2 diabetes mellitus (HCC) - continue Lantus Solostar 100 units/mL injected 16 units subcutaneous Q HS, ABG before meals at bedtime Lab Results  Component Value Date   HGBA1C 6.8 02/06/2017     10.  Chronic respiratory failure with hypoxia (HCC) - continue O2 at 3 L/minute via [place] continuously, ipratropium albuterol when necessary, Ventolin HFA inhaler when necessary, Symbicort inhaler twice a day   11. OSA (obstructive sleep apnea) - continue CPAP at at bedtime   12. History of CLL - has chronic elevated WBC, no fever,  check CBC on 03/13/17   13. Mood Disorder - continue Depakote DR 125 mg 2 tabs = 250 mg by mouth twice a day at 12 PM and 6 PM   14. COPD -  No wheezing; continue O2 at 3 L/minute via [place] continuously, ipratropium albuterol when necessary, Ventolin HFA inhaler when necessary, Symbicort inhaler twice a day   15. Insomnia -  continue melatonin 3 mg 2 tabs = 6 mg by mouth daily at  bedtime when necessary     Goals of care:  Long-term care     Fedor Kazmierski C. Chauvin - NP    [employer] [phone]

## 2017-03-10 NOTE — Telephone Encounter (Signed)
This is a patient of [place], who was admitted to [facility] after hospitalization. [facility] F/U is needed. Hospital discharge from [facility] on 03/09/2017.

## 2017-03-11 DIAGNOSIS — I509 Heart failure, unspecified: Secondary | ICD-10-CM | POA: Diagnosis not present

## 2017-03-11 DIAGNOSIS — J449 Chronic obstructive pulmonary disease, unspecified: Secondary | ICD-10-CM | POA: Diagnosis not present

## 2017-03-11 DIAGNOSIS — R0602 Shortness of breath: Secondary | ICD-10-CM | POA: Diagnosis not present

## 2017-03-11 DIAGNOSIS — J438 Other emphysema: Secondary | ICD-10-CM | POA: Diagnosis not present

## 2017-03-11 DIAGNOSIS — E662 Morbid (severe) obesity with alveolar hypoventilation: Secondary | ICD-10-CM | POA: Diagnosis not present

## 2017-03-11 DIAGNOSIS — J9610 Chronic respiratory failure, unspecified whether with hypoxia or hypercapnia: Secondary | ICD-10-CM | POA: Diagnosis not present

## 2017-03-13 ENCOUNTER — Other Ambulatory Visit: Payer: Self-pay | Admitting: *Deleted

## 2017-03-13 ENCOUNTER — Encounter: Payer: Self-pay | Admitting: *Deleted

## 2017-03-13 DIAGNOSIS — S31809A Unspecified open wound of unspecified buttock, initial encounter: Secondary | ICD-10-CM | POA: Diagnosis not present

## 2017-03-13 DIAGNOSIS — S41102D Unspecified open wound of left upper arm, subsequent encounter: Secondary | ICD-10-CM | POA: Diagnosis not present

## 2017-03-13 DIAGNOSIS — S31809D Unspecified open wound of unspecified buttock, subsequent encounter: Secondary | ICD-10-CM | POA: Diagnosis not present

## 2017-03-13 LAB — CBC AND DIFFERENTIAL
HCT: 43 (ref 41–53)
Hemoglobin: 14.2 (ref 13.5–17.5)
NEUTROS ABS: 8
Platelets: 167 (ref 150–399)
WBC: 17

## 2017-03-13 LAB — PROTIME-INR: PROTIME: 21.8 — AB (ref 10.0–13.8)

## 2017-03-13 LAB — BASIC METABOLIC PANEL
BUN: 39 — AB (ref 4–21)
Creatinine: 1.2 (ref 0.6–1.3)
GLUCOSE: 138
POTASSIUM: 4.5 (ref 3.4–5.3)
SODIUM: 142 (ref 137–147)

## 2017-03-13 LAB — POCT INR: INR: 2 — AB (ref 0.9–1.1)

## 2017-03-13 NOTE — Patient Outreach (Signed)
[facility]) Care Management  03/13/2017  William Braun Feb 21, 1933 [id number]   Per SNF rep, patient was readmitted to SNF from hospital and will be long term with Palliative consulting. Unable to speak with any family at this time. Will send patient/wife case closure letter and advise [facility] team and PCP of plans as above.   Eduard Clos, MSW, [place] Worker  [place] [phone]

## 2017-03-14 ENCOUNTER — Encounter: Payer: Self-pay | Admitting: Internal Medicine

## 2017-03-14 ENCOUNTER — Non-Acute Institutional Stay (SKILLED_NURSING_FACILITY): Payer: PPO | Admitting: Internal Medicine

## 2017-03-14 DIAGNOSIS — A419 Sepsis, unspecified organism: Secondary | ICD-10-CM

## 2017-03-14 DIAGNOSIS — N390 Urinary tract infection, site not specified: Secondary | ICD-10-CM

## 2017-03-14 DIAGNOSIS — S50812D Abrasion of left forearm, subsequent encounter: Secondary | ICD-10-CM | POA: Diagnosis not present

## 2017-03-14 NOTE — Assessment & Plan Note (Signed)
Wound care to continue monitor daily

## 2017-03-14 NOTE — Assessment & Plan Note (Signed)
Complete nitrofurantoin Urology follow-up 03/24/17 MRSA may be colonizer, colony count was less than 70,000

## 2017-03-14 NOTE — Patient Instructions (Signed)
See assessment and plan under each diagnosis in the problem list and acutely for this visit 

## 2017-03-14 NOTE — Progress Notes (Signed)
NURSING HOME LOCATION:  [facility] ROOM NUMBER:  [id number]  CODE STATUS:  Full Code  PCP:  Hendricks Limes, MD  [address] [place] [address]  This is a nursing facility follow up for [facility] readmission within 30 days  Interim medical record and care since last [place] visit was updated with review of diagnostic studies and change in clinical status since last visit were documented.  HPI: The patient was hospitalized 6/16-6/21/18 for possible urosepsis. The evening prior to ED visit he did fall out of bed and injured his left forearm. He denies any cardiac or neurologic prodrome prior to the fall. He said basically he woke up and found himself on the floor. In the ED he was found to be short of breath. CT angiogram of the chest revealed no PE or interstitial edema. Urinalysis was abnormal and he was admitted for urosepsis. Initially he was started on ceftriaxone but was transitioned to vancomycin when culture grew 70,000 colonies MRSA. While hospitalized he exhibited hypotension with Demadex 60 mg, cardiology adjusted his diuretics. Due to low blood pressure of 77/45 Demadex and Toprol-XL were held. Palliative care consultation was completed because of multiple readmissions in the last 3 months; palliative care was to be continued at the SNF. The morning of discharge patient refused labs but was clinically stable allowing discharge. He was transitioned to oral nitrofurantoin, Bactrim was not prescribed because of possible interaction with Coumadin. Skin tear was to be monitored by wound care at the SNF. He also had right wrist pain for which prednisone was prescribed for possible gout flare. He has chronic kidney disease which did stabilize and improve with final creatinine of 1.29. At discharge cardiology recommended continued Demadex 60 mg daily rather than twice a day. Beta blocker was to be resumed at the SNF. He will follow-up with cardiology outpatient  clinic. Significant past history is isolation of MRSA in 2010 in context of being catheterized for prolonged period of time. It is for this reason that the patient's wife wants him seen by urology. That appointment is pending on July 6. Other issues which are stable @ this time include rate controlled atrial fibrillation, chronic hypoxic respiratory failure, and CLL.  Diabetic control is been fair to good with glucoses ranging from 110-373  Review of systems:he describes some chronic shortness of breath which is stable. Intermittent loose  cough with greenish sputum. He denies any upper respiratory tract symptoms. He has had some shoulder discomfort bilaterally.  He has absolutely no genitourinary symptoms. History is somewhat questionable as he gave the date as June 20 but could not give the year or the name of the president.   Constitutional: No fever,significant weight change, fatigue  Eyes: No redness, discharge, pain, vision change ENT/mouth: No nasal congestion,  purulent discharge, earache,change in hearing ,sore throat  Cardiovascular: No chest pain, palpitations,paroxysmal nocturnal dyspnea, claudication, edema  Respiratory: No hemoptysis, significant snoring,apnea  Gastrointestinal: No heartburn,dysphagia,abdominal pain, nausea / vomiting,rectal bleeding, melena Genitourinary: No dysuria,hematuria, pyuria,  incontinence, nocturia Dermatologic: No rash, pruritus Neurologic: No dizziness,headache,syncope, seizures, numbness , tingling Endocrine: No change in hair/skin/ nails, excessive thirst, excessive hunger, excessive urination  Hematologic/lymphatic: No lymphadenopathy,abnormal bleeding Allergy/immunology: No itchy/ watery eyes, significant sneezing, urticaria, angioedema  Physical exam:  Pertinent or positive findings: he appears chronically ill. Pattern alopecia is present. He has poor dentition. Heart rhythm and rate are irregular; heart sounds are distant.Breath sounds are  decreased. He has isolated flexion contractures of the right  hand. Umbilical hernia is present.Pedal pulses are decreased. Abrasion over the left forearm is dressed. He has marked hyperpigmentation over the shins. Multiple scattered excoriations over the lower extremities.   General appearance:Adequately nourished; no acute distress , increased work of breathing is present.   Lymphatic: No lymphadenopathy about the head, neck, axilla . Eyes: No conjunctival inflammation or lid edema is present. There is no scleral icterus. Ears:  External ear exam shows no significant lesions or deformities.   Nose:  External nasal examination shows no deformity or inflammation. Nasal mucosa are pink and moist without lesions ,exudates Oral exam: lips and gums are healthy appearing.There is no oropharyngeal erythema or exudate . Neck:  No thyromegaly, masses, tenderness noted.    Heart:  No gallop, murmur, click, rub .  Lungs: without wheezes, rhonchi,rales , rubs. Abdomen:Bowel sounds are normal. Abdomen is soft and nontender with no organomegaly, masses. GU: deferred  Extremities:  No cyanosis, clubbing,edema  Skin: Warm & dry w/o tenting. No significant rash.  See summary under each active problem in the Problem List with associated updated therapeutic plan

## 2017-03-15 ENCOUNTER — Encounter: Payer: Self-pay | Admitting: Internal Medicine

## 2017-03-15 DIAGNOSIS — R627 Adult failure to thrive: Secondary | ICD-10-CM | POA: Insufficient documentation

## 2017-03-16 LAB — CBC AND DIFFERENTIAL
HCT: 49 (ref 41–53)
Hemoglobin: 16 (ref 13.5–17.5)
NEUTROS ABS: 11
Platelets: 157 (ref 150–399)
WBC: 19

## 2017-03-17 ENCOUNTER — Encounter: Payer: Self-pay | Admitting: Hematology & Oncology

## 2017-03-17 ENCOUNTER — Non-Acute Institutional Stay (SKILLED_NURSING_FACILITY): Payer: PPO | Admitting: Adult Health

## 2017-03-17 ENCOUNTER — Encounter: Payer: Self-pay | Admitting: Adult Health

## 2017-03-17 DIAGNOSIS — C9190 Lymphoid leukemia, unspecified not having achieved remission: Secondary | ICD-10-CM | POA: Diagnosis not present

## 2017-03-17 DIAGNOSIS — Z794 Long term (current) use of insulin: Secondary | ICD-10-CM

## 2017-03-17 DIAGNOSIS — M1A0410 Idiopathic chronic gout, right hand, without tophus (tophi): Secondary | ICD-10-CM

## 2017-03-17 DIAGNOSIS — E0869 Diabetes mellitus due to underlying condition with other specified complication: Secondary | ICD-10-CM

## 2017-03-17 DIAGNOSIS — C9110 Chronic lymphocytic leukemia of B-cell type not having achieved remission: Secondary | ICD-10-CM

## 2017-03-17 DIAGNOSIS — IMO0002 Reserved for concepts with insufficient information to code with codable children: Secondary | ICD-10-CM

## 2017-03-17 DIAGNOSIS — E0865 Diabetes mellitus due to underlying condition with hyperglycemia: Secondary | ICD-10-CM | POA: Diagnosis not present

## 2017-03-17 NOTE — Progress Notes (Signed)
DATE:  03/17/2017   MRN:  [id number]  BIRTHDAY: [birth year]  Facility:  Nursing Home Location:  [facility] Room Number: 706-C  LEVEL OF CARE:  SNF (31)  Contact Information    Name Relation Home Work Mobile   [place],Helen Spouse [phone]  [phone]   [place] Son [phone]         Code Status History    Date Active Date Inactive Code Status Order ID Comments User Context   03/04/2017  8:53 AM 03/09/2017  7:01 PM DNR [id number]  Bonnielee Haff, MD Inpatient   12/29/2016  2:15 AM 01/03/2017  8:09 PM DNR [id number]  Rise Patience, MD Inpatient   10/30/2016  2:19 AM 11/07/2016  5:08 PM Full Code [id number]  Rexanne Mano, MD ED   09/14/2016 10:33 PM 09/23/2016  9:40 PM Full Code [id number]  Ivor Costa, MD ED    Questions for Most Recent Historical Code Status (Order [id number])    Question Answer Comment   In the event of cardiac or respiratory ARREST Do not call a "code blue"    In the event of cardiac or respiratory ARREST Do not perform Intubation, CPR, defibrillation or ACLS    In the event of cardiac or respiratory ARREST Use medication by any route, position, wound care, and other measures to relive pain and suffering. May use oxygen, suction and manual treatment of airway obstruction as needed for comfort.        Chief Complaint  Patient presents with  . Acute Visit    Elevated CBGs    HISTORY OF PRESENT ILLNESS:  This is an 66-YO male seen for an acute visit secondary to elevated blood glucose levels.  He is a long-term care resident of [facility].  CBGs  Are as follows:  148, 332, 237, 135, 211, 279, 189 and 209. He was seen in the room today. He did not verbalize any concerns. He is currently on Prednisone 10 mg  Q D for acute gout. Latest wbc 19.0. No fever has been reported. He has history of CLL and follows-up with oncology.    PAST MEDICAL HISTORY:  Past Medical History:  Diagnosis Date  .  Atrial fibrillation ([place])   . Cerebrovascular disease   . CHF (congestive heart failure) ([place])   . CLL (chronic lymphocytic leukemia) ([place]) 11/17/2014  . COPD (chronic obstructive pulmonary disease) ([place])   . History of thrombocytopenia   . Obesity   . Osteoarthritis   . Other and unspecified hyperlipidemia   . Type II or unspecified type diabetes mellitus without mention of complication, not stated as uncontrolled   . Unspecified essential hypertension      CURRENT MEDICATIONS: Reviewed  Patient's Medications  New Prescriptions   No medications on file  Previous Medications   ALBUTEROL (VENTOLIN HFA) 108 (90 BASE) MCG/ACT INHALER    Inhale 2 puffs into the lungs every 6 (six) hours as needed for wheezing or shortness of breath.   ALLOPURINOL (ZYLOPRIM) 100 MG TABLET    Take 1 tablet (100 mg total) by mouth daily.   ALUMINUM-MAGNESIUM HYDROXIDE-SIMETHICONE (MAALOX) [phone] MG/5ML SUSP    Take 30 mLs by mouth every 4 (four) hours as needed (INDIGESTION OR HEARTBURN).   AMINO ACIDS-PROTEIN HYDROLYS (FEEDING SUPPLEMENT, PRO-STAT SUGAR FREE 64,) LIQD    Take 30 mLs by mouth daily.   ANTISEPTIC ORAL RINSE (BIOTENE) LIQD    15 mLs by Mouth Rinse route every 6 (six) hours as  needed for dry mouth.   ATORVASTATIN (LIPITOR) 10 MG TABLET    Take 1 tablet (10 mg total) by mouth daily at 6 PM.   BISACODYL (DULCOLAX) 10 MG SUPPOSITORY    Place 10 mg rectally daily as needed for moderate constipation.   BUDESONIDE-FORMOTEROL (SYMBICORT) 160-4.5 MCG/ACT INHALER    Inhale 2 puffs into the lungs 2 (two) times daily. 2 puffs twice daily   DIVALPROEX (DEPAKOTE) 250 MG DR TABLET    Take 250 mg by mouth 2 (two) times daily.   HYDROCODONE-ACETAMINOPHEN (NORCO/VICODIN) 5-325 MG TABLET    Take 1 tablet by mouth every 8 (eight) hours as needed for moderate pain.   INSULIN GLARGINE (LANTUS SOLOSTAR) 100 UNIT/ML SOLOSTAR PEN    Inject 16 Units into the skin daily at 10 pm.   IPRATROPIUM-ALBUTEROL (DUONEB)  0.5-2.5 (3) MG/3ML SOLN    Take 3 mLs by nebulization every 6 (six) hours as needed (COPD).   MAGNESIUM HYDROXIDE (MILK OF MAGNESIA) 400 MG/5ML SUSPENSION    Take 30 mLs by mouth every 4 (four) hours as needed for heartburn or indigestion.    MELATONIN 3 MG TABS    Take 6 mg by mouth at bedtime as needed (sleep).    METOPROLOL SUCCINATE (TOPROL-XL) 25 MG 24 HR TABLET    Take 25 mg by mouth daily.    NITROGLYCERIN (NITROSTAT) 0.4 MG SL TABLET    Place 1 tablet (0.4 mg total) under the tongue every 5 (five) minutes as needed for chest pain.   NUTRITIONAL SUPPLEMENT LIQD    Take 120 mLs by mouth 3 (three) times daily. [employer]   NYSTATIN CREAM (MYCOSTATIN)    Apply 1 application topically 2 (two) times daily. Apply to gluteal fold/buttocks   OXYGEN    Inhale 3 L into the lungs continuous.   POLYETHYLENE GLYCOL (MIRALAX / GLYCOLAX) PACKET    Take 17 g by mouth daily.   POTASSIUM CHLORIDE SA (K-DUR,KLOR-CON) 20 MEQ TABLET    Take 1 tablet (20 mEq total) by mouth 2 (two) times daily.   PREDNISONE (DELTASONE) 10 MG TABLET    Take 10 mg by mouth daily with breakfast.   SENNA-DOCUSATE (SENOKOT-S) 8.6-50 MG TABLET    Take 1 tablet by mouth at bedtime as needed for mild constipation.   SODIUM PHOSPHATES (RA SALINE ENEMA RE)    Place 1 Applicatorful rectally daily as needed (constipation).   TIOTROPIUM (SPIRIVA) 18 MCG INHALATION CAPSULE    Place 1 capsule (18 mcg total) into inhaler and inhale daily.   TORSEMIDE (DEMADEX) 20 MG TABLET    Take 3 tablets (60 mg total) by mouth daily.   WARFARIN (COUMADIN) 2.5 MG TABLET    Take 2.5 mg by mouth See admin instructions. Take Tue-Thu-Sat   WARFARIN (COUMADIN) 5 MG TABLET    Take 5 mg by mouth. Take M-W-F  Modified Medications   No medications on file  Discontinued Medications   No medications on file     Allergies  Allergen Reactions  . Quinine Palpitations and Other (See Comments)    Seizure   . Uloric [Febuxostat] Rash     REVIEW OF  SYSTEMS:  GENERAL: no change in appetite, no fatigue, no weight changes, no fever, chills or weakness  EYES: Denies change in vision, dry eyes, eye pain, itching or discharge EARS: Denies change in hearing, ringing in ears, or earache NOSE: Denies nasal congestion or epistaxis MOUTH and THROAT: Denies oral discomfort, gingival pain or bleeding, pain from teeth or hoarseness  RESPIRATORY: no cough, SOB, DOE, wheezing, hemoptysis CARDIAC: no chest pain, edema or palpitations GI: no abdominal pain, diarrhea, constipation, heart burn, nausea or vomiting GU: Denies dysuria, frequency, hematuria, incontinence, or discharge PSYCHIATRIC: Denies feeling of depression or anxiety. No report of hallucinations, insomnia, paranoia, or agitation   PHYSICAL EXAMINATION  GENERAL APPEARANCE: Well nourished. In no acute distress. Obese SKIN:  Skin tears on left forearm, black scabs on right 2nd and 3rd toes and left 5th toe, scabs on bilateral shin which is discolored, right knee with scab HEAD: Normal in size and contour. No evidence of trauma EYES: Lids open and close normally. No blepharitis, entropion or ectropion. PERRL. Conjunctivae are clear and sclerae are white. Lenses are without opacity EARS: Pinnae are normal. Patient hears normal voice tunes of the examiner MOUTH and THROAT: Lips are without lesions. Oral mucosa is moist and without lesions. Tongue is normal in shape, size, and color and without lesions NECK: supple, trachea midline, no neck masses, no thyroid tenderness, no thyromegaly LYMPHATICS: no LAN in the neck, no supraclavicular LAN RESPIRATORY: breathing is even & unlabored, BS CTAB, has O2 @ 3L/min via [place] continuously CARDIAC: irregular heart rate, no murmur,no extra heart sounds, no edema GI: abdomen soft, normal BS, no masses, no tenderness, no hepatomegaly, no splenomegaly EXTREMITIES:  Able to move X 4 extremities, BLE has generalized weakness PSYCHIATRIC: Alert to self and  time, disoriented to place. Affect and behavior are appropriate   LABS/RADIOLOGY: Labs reviewed: Basic Metabolic Panel:  Recent Labs  10/30/16 0238  12/31/16 0322  01/03/17 0404  03/06/17 0757 03/07/17 0338 03/08/17 0528 03/08/17 1003 03/13/17  NA 134*  < > 135  < > 140  < > 137 136  --  139 142  K 3.8  < > 4.0  < > 3.4*  < > 3.2* 4.2  --  3.1* 4.5  CL 83*  < > 88*  < > 86*  < > 97* 96*  --  94*  --   CO2 33*  < > 36*  < > 44*  < > 28 32  --  38*  --   GLUCOSE 376*  < > 276*  < > 173*  < > 137* 242*  --  237*  --   BUN 105*  < > 60*  < > 57*  < > 50* 48*  --  40* 39*  CREATININE 2.21*  < > 1.66*  < > 1.28*  < > 1.48* 1.44* 1.37* 1.29* 1.2  CALCIUM 8.5*  < > 8.5*  < > 8.9  < > 8.3* 8.3*  --  8.5*  --   MG 1.9  < > 2.6*  --  2.3  --   --   --   --  2.1  --   PHOS 4.6  --   --   --   --   --   --   --   --  2.5  --   < > = values in this interval not displayed. Liver Function Tests:  Recent Labs  02/23/17 1313 03/04/17 0250 03/08/17 1003  AST 29 22 41  ALT 11 10* 33  ALKPHOS 72 71 63  BILITOT 0.70 0.8 0.8  PROT 7.1 7.7 6.6  ALBUMIN 2.5* 2.7* 2.6*    Recent Labs  10/30/16 0903  AMMONIA 19   CBC:  Recent Labs  03/05/17 0345 03/06/17 0757 03/08/17 1003 03/13/17 03/16/17  WBC 15.1* 20.1* 21.1* 17.0 19.0  NEUTROABS  --   --  11.8* 8 11  HGB 12.3* 11.6* 13.0 14.2 16.0  HCT 37.9* 34.9* 39.1 43 49  MCV 91.8 90.9 91.1  --   --   PLT 203 187 188 167 157   Lipid Panel:  Recent Labs  09/15/16 0434  HDL 23*   Cardiac Enzymes:  Recent Labs  03/04/17 0250 03/04/17 1213 03/04/17 1821  TROPONINI 0.03* 0.04* 0.03*   CBG:  Recent Labs  03/08/17 2231 03/09/17 0753 03/09/17 1216  GLUCAP 346* 118* 181*      Dg Wrist 2 Views Right  Result Date: 03/04/2017 CLINICAL DATA:  Pain in right wrist due to gout per pt ; no recent injury; pt id checked with RN EXAM: RIGHT WRIST - 2 VIEW COMPARISON:  12/29/2016, 12/30/2016 MRI FINDINGS: There is no acute fracture  or dislocation. There are well corticated erosions in the interphalangeal joint of the thumb and the metacarpal phalangeal joints, consistent with the history of gout. Bone mineral density is normal. There is atherosclerotic calcification of the small arteries. Soft tissue swelling of the thumb. Joint space narrowing and corticated erosions of the first carpometacarpal joint. IMPRESSION: 1.  No evidence for acute  abnormality. 2. Findings are compatible with gout arthropathy and appear stable. Electronically Signed   By: Nolon Nations M.D.   On: 03/04/2017 11:21   Ct Angio Chest Pe W/cm &/or Wo Cm  Result Date: 03/04/2017 CLINICAL DATA:  Acute onset of generalized chest pain and shortness of breath. Tachycardia. Decreased O2 saturation. Initial encounter. EXAM: CT ANGIOGRAPHY CHEST WITH CONTRAST TECHNIQUE: Multidetector CT imaging of the chest was performed using the standard protocol during bolus administration of intravenous contrast. Multiplanar CT image reconstructions and MIPs were obtained to evaluate the vascular anatomy. CONTRAST:  80 mL of Isovue 370 IV contrast COMPARISON:  Chest radiograph performed earlier today at 2:33 a.m., and CT of the chest performed 09/19/2016 FINDINGS: Cardiovascular:  There is no evidence of pulmonary embolus. The heart is borderline normal in size. Diffuse coronary artery calcifications are seen. Scattered calcification is noted along the thoracic aorta and proximal great vessels. Mediastinum/Nodes: Scattered enlarged mediastinal and left hilar nodes are seen, measuring up to 1.9 cm in short axis, particularly at the right paratracheal region and azygoesophageal recess. No pericardial effusion is identified. The thyroid gland is unremarkable. No axillary lymphadenopathy is appreciated. Lungs/Pleura: Mild scattered atelectasis is noted bilaterally. The lungs are difficult to fully assess due to motion artifact. No pleural effusion or pneumothorax is seen. No masses are  identified. Upper Abdomen: The visualized portions of the liver and spleen are grossly unremarkable. Stones are noted dependently within the gallbladder. The gallbladder is mildly distended but otherwise unremarkable. The visualized portions of the pancreas and adrenal glands are within normal limits. Nonspecific perinephric stranding is noted bilaterally. Musculoskeletal: No acute osseous abnormalities are identified. The visualized musculature is unremarkable in appearance. Review of the MIP images confirms the above findings. IMPRESSION: 1. No evidence of pulmonary embolus. 2. Mild scattered atelectasis noted bilaterally. 3. Enlarged mediastinal and left hilar nodes, measuring up to 1.9 cm in short axis. These are persistent from the prior study, and could reflect underlying lymphoma or inflammatory disease. Several nodes would be amenable to biopsy, as deemed clinically appropriate. 4. Diffuse coronary artery calcifications seen. 5. Cholelithiasis. Gallbladder mildly distended but otherwise unremarkable. Electronically Signed   By: Garald Balding M.D.   On: 03/04/2017 05:24   Dg Chest Portable 1 View  Result Date: 03/04/2017 CLINICAL DATA:  Status post fall out  of bed, with shortness of breath and right shoulder pain. Initial encounter. EXAM: PORTABLE CHEST 1 VIEW COMPARISON:  Chest radiograph performed 01/02/2017 FINDINGS: There is mild elevation of the right hemidiaphragm. Vascular congestion is noted. Mild interstitial prominence may reflect transient interstitial edema. No pneumothorax is seen. The cardiomediastinal silhouette is normal in size. No acute osseous abnormalities are identified. IMPRESSION: Mild elevation of the right hemidiaphragm. Vascular congestion noted. Mild interstitial prominence may reflect transient interstitial edema. No displaced rib fracture seen. Electronically Signed   By: Garald Balding M.D.   On: 03/04/2017 02:58    ASSESSMENT/PLAN:  1. Diabetes mellitus due to  underlying condition, uncontrolled, with other specified complication, with long-term current use of insulin (HCC) - continue Lantus 100 units/ml 16 units SQ Q HS and start Humalog 100 units/ml give 5 units SQ  Q AM and HS, CBG BID Lab Results  Component Value Date   HGBA1C 6.8 02/06/2017     2. CLL (chronic lymphocytic leukemia) ([place]) - will follow-up with oncology, Dr. Marin Olp Lab Results  Component Value Date   WBC 19.0 03/16/2017    3. Chronic gout of right hand, unspecified cause -  Discontinue Prednisone and continue Allopurinol 100 mg PO Q D      Jossette Zirbel C. [place] - NP    [employer] [phone]

## 2017-03-19 DIAGNOSIS — S50812A Abrasion of left forearm, initial encounter: Secondary | ICD-10-CM | POA: Diagnosis present

## 2017-03-19 DIAGNOSIS — M1990 Unspecified osteoarthritis, unspecified site: Secondary | ICD-10-CM | POA: Diagnosis not present

## 2017-03-19 DIAGNOSIS — S31809D Unspecified open wound of unspecified buttock, subsequent encounter: Secondary | ICD-10-CM | POA: Diagnosis not present

## 2017-03-19 DIAGNOSIS — N400 Enlarged prostate without lower urinary tract symptoms: Secondary | ICD-10-CM | POA: Diagnosis not present

## 2017-03-19 DIAGNOSIS — M2553 Pain in wrist: Secondary | ICD-10-CM | POA: Diagnosis not present

## 2017-03-19 DIAGNOSIS — Z8249 Family history of ischemic heart disease and other diseases of the circulatory system: Secondary | ICD-10-CM | POA: Diagnosis not present

## 2017-03-19 DIAGNOSIS — N183 Chronic kidney disease, stage 3 (moderate): Secondary | ICD-10-CM | POA: Diagnosis not present

## 2017-03-19 DIAGNOSIS — C9510 Chronic leukemia of unspecified cell type not having achieved remission: Secondary | ICD-10-CM | POA: Diagnosis not present

## 2017-03-19 DIAGNOSIS — N179 Acute kidney failure, unspecified: Secondary | ICD-10-CM | POA: Diagnosis not present

## 2017-03-19 DIAGNOSIS — I4891 Unspecified atrial fibrillation: Secondary | ICD-10-CM | POA: Diagnosis not present

## 2017-03-19 DIAGNOSIS — E1122 Type 2 diabetes mellitus with diabetic chronic kidney disease: Secondary | ICD-10-CM | POA: Diagnosis not present

## 2017-03-19 DIAGNOSIS — I480 Paroxysmal atrial fibrillation: Secondary | ICD-10-CM | POA: Diagnosis not present

## 2017-03-19 DIAGNOSIS — J9600 Acute respiratory failure, unspecified whether with hypoxia or hypercapnia: Secondary | ICD-10-CM | POA: Diagnosis not present

## 2017-03-19 DIAGNOSIS — F0390 Unspecified dementia without behavioral disturbance: Secondary | ICD-10-CM | POA: Diagnosis not present

## 2017-03-19 DIAGNOSIS — N390 Urinary tract infection, site not specified: Secondary | ICD-10-CM | POA: Diagnosis not present

## 2017-03-19 DIAGNOSIS — A419 Sepsis, unspecified organism: Secondary | ICD-10-CM | POA: Diagnosis not present

## 2017-03-19 DIAGNOSIS — R000 Tachycardia, unspecified: Secondary | ICD-10-CM | POA: Diagnosis present

## 2017-03-19 DIAGNOSIS — J9611 Chronic respiratory failure with hypoxia: Secondary | ICD-10-CM | POA: Diagnosis not present

## 2017-03-19 DIAGNOSIS — Z7901 Long term (current) use of anticoagulants: Secondary | ICD-10-CM | POA: Diagnosis not present

## 2017-03-19 DIAGNOSIS — I130 Hypertensive heart and chronic kidney disease with heart failure and stage 1 through stage 4 chronic kidney disease, or unspecified chronic kidney disease: Secondary | ICD-10-CM | POA: Diagnosis not present

## 2017-03-19 DIAGNOSIS — I5032 Chronic diastolic (congestive) heart failure: Secondary | ICD-10-CM | POA: Diagnosis not present

## 2017-03-19 DIAGNOSIS — I272 Pulmonary hypertension, unspecified: Secondary | ICD-10-CM | POA: Diagnosis not present

## 2017-03-19 DIAGNOSIS — E662 Morbid (severe) obesity with alveolar hypoventilation: Secondary | ICD-10-CM | POA: Diagnosis not present

## 2017-03-19 DIAGNOSIS — S41102D Unspecified open wound of left upper arm, subsequent encounter: Secondary | ICD-10-CM | POA: Diagnosis not present

## 2017-03-19 DIAGNOSIS — X58XXXA Exposure to other specified factors, initial encounter: Secondary | ICD-10-CM | POA: Diagnosis present

## 2017-03-19 DIAGNOSIS — I509 Heart failure, unspecified: Secondary | ICD-10-CM | POA: Diagnosis not present

## 2017-03-19 DIAGNOSIS — I952 Hypotension due to drugs: Secondary | ICD-10-CM | POA: Diagnosis not present

## 2017-03-19 DIAGNOSIS — Z9981 Dependence on supplemental oxygen: Secondary | ICD-10-CM | POA: Diagnosis not present

## 2017-03-19 DIAGNOSIS — Z66 Do not resuscitate: Secondary | ICD-10-CM | POA: Diagnosis not present

## 2017-03-19 DIAGNOSIS — J449 Chronic obstructive pulmonary disease, unspecified: Secondary | ICD-10-CM | POA: Diagnosis not present

## 2017-03-19 DIAGNOSIS — S31809A Unspecified open wound of unspecified buttock, initial encounter: Secondary | ICD-10-CM | POA: Diagnosis not present

## 2017-03-19 DIAGNOSIS — E119 Type 2 diabetes mellitus without complications: Secondary | ICD-10-CM | POA: Diagnosis not present

## 2017-03-19 DIAGNOSIS — R488 Other symbolic dysfunctions: Secondary | ICD-10-CM | POA: Diagnosis not present

## 2017-03-19 DIAGNOSIS — C9110 Chronic lymphocytic leukemia of B-cell type not having achieved remission: Secondary | ICD-10-CM | POA: Diagnosis not present

## 2017-03-19 DIAGNOSIS — M6281 Muscle weakness (generalized): Secondary | ICD-10-CM | POA: Diagnosis not present

## 2017-03-19 DIAGNOSIS — R338 Other retention of urine: Secondary | ICD-10-CM | POA: Diagnosis not present

## 2017-03-19 DIAGNOSIS — M25531 Pain in right wrist: Secondary | ICD-10-CM | POA: Diagnosis not present

## 2017-03-19 DIAGNOSIS — I248 Other forms of acute ischemic heart disease: Secondary | ICD-10-CM | POA: Diagnosis not present

## 2017-03-19 DIAGNOSIS — R1312 Dysphagia, oropharyngeal phase: Secondary | ICD-10-CM | POA: Diagnosis not present

## 2017-03-19 DIAGNOSIS — R402411 Glasgow coma scale score 13-15, in the field [EMT or ambulance]: Secondary | ICD-10-CM | POA: Diagnosis not present

## 2017-03-19 DIAGNOSIS — R05 Cough: Secondary | ICD-10-CM | POA: Diagnosis not present

## 2017-03-19 DIAGNOSIS — Z87891 Personal history of nicotine dependence: Secondary | ICD-10-CM | POA: Diagnosis not present

## 2017-03-19 DIAGNOSIS — A4102 Sepsis due to Methicillin resistant Staphylococcus aureus: Secondary | ICD-10-CM | POA: Diagnosis not present

## 2017-03-19 DIAGNOSIS — R4182 Altered mental status, unspecified: Secondary | ICD-10-CM | POA: Diagnosis not present

## 2017-03-19 DIAGNOSIS — E860 Dehydration: Secondary | ICD-10-CM | POA: Diagnosis not present

## 2017-03-19 DIAGNOSIS — E1142 Type 2 diabetes mellitus with diabetic polyneuropathy: Secondary | ICD-10-CM | POA: Diagnosis not present

## 2017-03-19 DIAGNOSIS — E875 Hyperkalemia: Secondary | ICD-10-CM | POA: Diagnosis not present

## 2017-03-19 DIAGNOSIS — R0602 Shortness of breath: Secondary | ICD-10-CM | POA: Diagnosis not present

## 2017-03-19 DIAGNOSIS — G4733 Obstructive sleep apnea (adult) (pediatric): Secondary | ICD-10-CM | POA: Diagnosis not present

## 2017-03-19 HISTORY — DX: Acute kidney failure, unspecified: N17.9

## 2017-03-20 DIAGNOSIS — S31809D Unspecified open wound of unspecified buttock, subsequent encounter: Secondary | ICD-10-CM | POA: Diagnosis not present

## 2017-03-20 DIAGNOSIS — S41102D Unspecified open wound of left upper arm, subsequent encounter: Secondary | ICD-10-CM | POA: Diagnosis not present

## 2017-03-20 DIAGNOSIS — S31809A Unspecified open wound of unspecified buttock, initial encounter: Secondary | ICD-10-CM | POA: Diagnosis not present

## 2017-03-21 ENCOUNTER — Other Ambulatory Visit: Payer: Self-pay

## 2017-03-21 DIAGNOSIS — C9110 Chronic lymphocytic leukemia of B-cell type not having achieved remission: Secondary | ICD-10-CM

## 2017-03-23 ENCOUNTER — Other Ambulatory Visit: Payer: Self-pay | Admitting: Family

## 2017-03-23 ENCOUNTER — Other Ambulatory Visit (HOSPITAL_BASED_OUTPATIENT_CLINIC_OR_DEPARTMENT_OTHER): Payer: PPO

## 2017-03-23 ENCOUNTER — Ambulatory Visit (HOSPITAL_BASED_OUTPATIENT_CLINIC_OR_DEPARTMENT_OTHER): Payer: PPO | Admitting: Hematology & Oncology

## 2017-03-23 VITALS — BP 90/39 | HR 82 | Temp 97.6°F | Resp 19

## 2017-03-23 DIAGNOSIS — G8929 Other chronic pain: Secondary | ICD-10-CM

## 2017-03-23 DIAGNOSIS — E119 Type 2 diabetes mellitus without complications: Secondary | ICD-10-CM

## 2017-03-23 DIAGNOSIS — I509 Heart failure, unspecified: Secondary | ICD-10-CM | POA: Diagnosis not present

## 2017-03-23 DIAGNOSIS — C9110 Chronic lymphocytic leukemia of B-cell type not having achieved remission: Secondary | ICD-10-CM

## 2017-03-23 DIAGNOSIS — R52 Pain, unspecified: Secondary | ICD-10-CM

## 2017-03-23 LAB — CBC WITH DIFFERENTIAL (CANCER CENTER ONLY)
BASO#: 0 10*3/uL (ref 0.0–0.2)
BASO%: 0.2 % (ref 0.0–2.0)
EOS ABS: 0.3 10*3/uL (ref 0.0–0.5)
EOS%: 1.4 % (ref 0.0–7.0)
HCT: 44.1 % (ref 38.7–49.9)
HEMOGLOBIN: 14.5 g/dL (ref 13.0–17.1)
LYMPH#: 7.5 10*3/uL — ABNORMAL HIGH (ref 0.9–3.3)
LYMPH%: 37.9 % (ref 14.0–48.0)
MCH: 31.4 pg (ref 28.0–33.4)
MCHC: 32.9 g/dL (ref 32.0–35.9)
MCV: 96 fL (ref 82–98)
MONO#: 1.7 10*3/uL — ABNORMAL HIGH (ref 0.1–0.9)
MONO%: 8.7 % (ref 0.0–13.0)
NEUT%: 51.8 % (ref 40.0–80.0)
NEUTROS ABS: 10.3 10*3/uL — AB (ref 1.5–6.5)
Platelets: 109 10*3/uL — ABNORMAL LOW (ref 145–400)
RBC: 4.62 10*6/uL (ref 4.20–5.70)
RDW: 18.9 % — ABNORMAL HIGH (ref 11.1–15.7)
WBC: 19.8 10*3/uL — AB (ref 4.0–10.0)

## 2017-03-23 LAB — CMP (CANCER CENTER ONLY)
ALBUMIN: 2.8 g/dL — AB (ref 3.3–5.5)
ALT(SGPT): 21 U/L (ref 10–47)
AST: 21 U/L (ref 11–38)
Alkaline Phosphatase: 72 U/L (ref 26–84)
BILIRUBIN TOTAL: 1.2 mg/dL (ref 0.20–1.60)
BUN: 25 mg/dL — AB (ref 7–22)
CO2: 34 mEq/L — ABNORMAL HIGH (ref 18–33)
CREATININE: 1 mg/dL (ref 0.6–1.2)
Calcium: 8.9 mg/dL (ref 8.0–10.3)
Chloride: 95 mEq/L — ABNORMAL LOW (ref 98–108)
Glucose, Bld: 174 mg/dL — ABNORMAL HIGH (ref 73–118)
Potassium: 4.3 mEq/L (ref 3.3–4.7)
SODIUM: 138 meq/L (ref 128–145)
TOTAL PROTEIN: 6.8 g/dL (ref 6.4–8.1)

## 2017-03-23 MED ORDER — HYDROCODONE-ACETAMINOPHEN 5-325 MG PO TABS
ORAL_TABLET | ORAL | Status: AC
  Filled 2017-03-23: qty 1

## 2017-03-23 MED ORDER — HYDROCODONE-ACETAMINOPHEN 5-325 MG PO TABS
1.0000 | ORAL_TABLET | Freq: Once | ORAL | Status: DC
  Administered 2017-03-23: 1 via ORAL

## 2017-03-23 MED ORDER — HYDROCODONE-ACETAMINOPHEN 5-325 MG PO TABS
1.0000 | ORAL_TABLET | Freq: Once | ORAL | Status: AC
  Administered 2017-03-23: 1 via ORAL

## 2017-03-23 NOTE — Addendum Note (Signed)
Addended by: Burney Gauze R on: 03/23/2017 09:52 AM   Modules accepted: Orders

## 2017-03-23 NOTE — Progress Notes (Signed)
Hematology and Oncology Follow Up Visit  ALEKZANDER CARDELL [id number] November 28, 1932 83 y.o. 03/23/2017   Principle Diagnosis:  CLL-stage A  Current Therapy:   Observation    Interim History:  Mr. Stankey is here today with his wife for a follow-up. Unfortunately, he was back in the hospital a couple weeks ago. He fell out of bed. He apparently had a urinary tract infection.   He didn't feel all that great. He does not have much energy. He seems to be eating okay. There is no nausea or vomiting.   He said no bleeding now so that when he fell and tore some skin off the left arm.   He is on quite a few medications.   He is diabetic. He is on insulin.   He is staying in a skilled nursing facility. His sounds like he may be there for a while. I just don't believe his wife can care for him at home.   She really doesn't know what medicines he is taking.   His last IgG level back in early June was 1737 mg/dL. This should be adequate for his immune system.  Currently, his performance status is ECOG 3.  Medications:  Allergies as of 03/23/2017      Reactions   Quinine Palpitations, Other (See Comments)   Seizure    Uloric [febuxostat] Rash      Medication List       Accurate as of 03/23/17  9:12 AM. Always use your most recent med list.          albuterol 108 (90 Base) MCG/ACT inhaler Commonly known as:  VENTOLIN HFA Inhale 2 puffs into the lungs every 6 (six) hours as needed for wheezing or shortness of breath.   allopurinol 100 MG tablet Commonly known as:  ZYLOPRIM Take 1 tablet (100 mg total) by mouth daily.   aluminum-magnesium hydroxide-simethicone 638-466-59 MG/5ML Susp Commonly known as:  MAALOX Take 30 mLs by mouth every 4 (four) hours as needed (INDIGESTION OR HEARTBURN).   antiseptic oral rinse Liqd 15 mLs by Mouth Rinse route every 6 (six) hours as needed for dry mouth.   atorvastatin 10 MG tablet Commonly known as:  LIPITOR Take 1 tablet (10 mg total) by mouth daily  at 6 PM.   bisacodyl 10 MG suppository Commonly known as:  DULCOLAX Place 10 mg rectally daily as needed for moderate constipation.   budesonide-formoterol 160-4.5 MCG/ACT inhaler Commonly known as:  SYMBICORT Inhale 2 puffs into the lungs 2 (two) times daily. 2 puffs twice daily   divalproex 250 MG DR tablet Commonly known as:  DEPAKOTE Take 250 mg by mouth 2 (two) times daily.   feeding supplement (PRO-STAT SUGAR FREE 64) Liqd Take 30 mLs by mouth daily.   HYDROcodone-acetaminophen 5-325 MG tablet Commonly known as:  NORCO/VICODIN Take 1 tablet by mouth every 8 (eight) hours as needed for moderate pain.   ipratropium-albuterol 0.5-2.5 (3) MG/3ML Soln Commonly known as:  DUONEB Take 3 mLs by nebulization every 6 (six) hours as needed (COPD).   LANTUS SOLOSTAR 100 UNIT/ML Solostar Pen Generic drug:  Insulin Glargine Inject 16 Units into the skin daily at 10 pm.   magnesium hydroxide 400 MG/5ML suspension Commonly known as:  MILK OF MAGNESIA Take 30 mLs by mouth every 4 (four) hours as needed for heartburn or indigestion.   Melatonin 3 MG Tabs Take 6 mg by mouth at bedtime as needed (sleep).   metoprolol succinate 25 MG 24 hr tablet Commonly known  as:  TOPROL-XL Take 25 mg by mouth daily.   nitroGLYCERIN 0.4 MG SL tablet Commonly known as:  NITROSTAT Place 1 tablet (0.4 mg total) under the tongue every 5 (five) minutes as needed for chest pain.   NUTRITIONAL SUPPLEMENT Liqd Take 120 mLs by mouth 3 (three) times daily. MedPass   nystatin cream Commonly known as:  MYCOSTATIN Apply 1 application topically 2 (two) times daily. Apply to gluteal fold/buttocks   OXYGEN Inhale 3 L into the lungs continuous.   polyethylene glycol packet Commonly known as:  MIRALAX / GLYCOLAX Take 17 g by mouth daily.   potassium chloride SA 20 MEQ tablet Commonly known as:  K-DUR,KLOR-CON Take 1 tablet (20 mEq total) by mouth 2 (two) times daily.   predniSONE 10 MG tablet Commonly  known as:  DELTASONE Take 10 mg by mouth daily with breakfast.   RA SALINE ENEMA RE Place 1 Applicatorful rectally daily as needed (constipation).   senna-docusate 8.6-50 MG tablet Commonly known as:  Senokot-S Take 1 tablet by mouth at bedtime as needed for mild constipation.   tiotropium 18 MCG inhalation capsule Commonly known as:  SPIRIVA Place 1 capsule (18 mcg total) into inhaler and inhale daily.   torsemide 20 MG tablet Commonly known as:  DEMADEX Take 3 tablets (60 mg total) by mouth daily.   warfarin 5 MG tablet Commonly known as:  COUMADIN Take 5 mg by mouth. Take M-W-F   warfarin 2.5 MG tablet Commonly known as:  COUMADIN Take 2.5 mg by mouth See admin instructions. Take Tue-Thu-Sat       Allergies:  Allergies  Allergen Reactions  . Quinine Palpitations and Other (See Comments)    Seizure   . Uloric [Febuxostat] Rash    Past Medical History, Surgical history, Social history, and Family History were reviewed and updated.  Review of Systems: All other 10 point review of systems is negative.   Physical Exam:  oral temperature is 97.6 F (36.4 C). His blood pressure is 90/39 (abnormal) and his pulse is 82. His respiration is 19 and oxygen saturation is 98%.   Wt Readings from Last 3 Encounters:  03/17/17 242 lb 3.2 oz (109.9 kg)  03/14/17 242 lb (109.8 kg)  03/10/17 253 lb 4.9 oz (114.9 kg)   Elderly, chronically ill-appearing white male. Head and neck exam shows no ocular or oral lesions. He has no adenopathy in the neck. He has no scleral icterus. Lungs are with decreased breath sounds On the right side area and left side, he has decent air movement. He has some crackles at the bases. Cardiac exam is a regular rate and rhythm. Abdomen is soft. He is moderately obese. He has no fluid wave. There is no palpable liver or spleen tip. Extremities shows chronic 1+ edema in his lower legs. He has arthritic changes in his joints. He has some small excoriations on  the anterior portion of his left lower leg. Neurological exam shows no focal neurological deficits.   Lab Results  Component Value Date   WBC 19.8 (H) 03/23/2017   HGB 14.5 03/23/2017   HCT 44.1 03/23/2017   MCV 96 03/23/2017   PLT 109 (L) 03/23/2017   Lab Results  Component Value Date   FERRITIN 157 06/22/2010   IRON 15 (L) 06/22/2010   TIBC 182 (L) 06/22/2010   UIBC 167 06/22/2010   IRONPCTSAT 8 (L) 06/22/2010   Lab Results  Component Value Date   RETICCTPCT 2.8 10/30/2016   RBC 4.62 03/23/2017  Lab Results  Component Value Date   KPAFRELGTCHN 10.70 (H) 11/17/2014   LAMBDASER 7.06 (H) 11/17/2014   KAPLAMBRATIO 0.88 02/23/2017   Lab Results  Component Value Date   IGGSERUM 1,737 (H) 02/23/2017   IGA 271 11/01/2016   IGMSERUM 61 02/23/2017   Lab Results  Component Value Date   TOTALPROTELP 7.8 04/23/2014   ALBUMINELP 45.6 (L) 04/23/2014   A1GS 5.7 (H) 04/23/2014   A2GS 10.1 04/23/2014   BETS 7.0 04/23/2014   BETA2SER 6.0 04/23/2014   GAMS 25.6 (H) 04/23/2014   MSPIKE Not Observed 10/21/2015   SPEI * 04/23/2014     Chemistry      Component Value Date/Time   NA 138 03/23/2017 0817   NA 142 04/20/2016 1143   K 4.3 03/23/2017 0817   K 4.4 04/20/2016 1143   CL 95 (L) 03/23/2017 0817   CO2 34 (H) 03/23/2017 0817   CO2 31 (H) 04/20/2016 1143   BUN 25 (H) 03/23/2017 0817   BUN 30.0 (H) 04/20/2016 1143   CREATININE 1.0 03/23/2017 0817   CREATININE 1.8 (H) 04/20/2016 1143   GLU 138 03/13/2017      Component Value Date/Time   CALCIUM 8.9 03/23/2017 0817   CALCIUM 10.0 04/20/2016 1143   ALKPHOS 72 03/23/2017 0817   ALKPHOS 69 04/20/2016 1143   AST 21 03/23/2017 0817   AST 16 04/20/2016 1143   ALT 21 03/23/2017 0817   ALT 11 04/20/2016 1143   BILITOT 1.20 03/23/2017 0817   BILITOT 0.83 04/20/2016 1143     Impression and Plan: Mr. Valliant is 83 yo male with stage A CLL documented by flow cytometry. He has multiple health issues including CHF, which  clearly is his biggest issue.   His nutritional state is lacking. His albumin is only 2.5. I think this is going be a big problem for him. He can certainly affect his Coumadin. It may effect other medications.  His blood pressure definitely is on the low side. However, a mitral is much can be done with this. He has congestive heart failure. He has fluid overload issues. His last echocardiogram was done back in January. I don't know if he needs another one. I'm sure his cardiologist are on top of this.  The CLL, from my point, is just not much of an issue. His platelet count is lower. We will have to watch this.  I just feel bad that he cannot get much better. He just has a lot going against him.  I'm sure he'll end up back in the hospital before I see him again.  I would like to see him back in 3 months. I think this would be reasonable. We will see what his platelet count is when we see him back.   Volanda Napoleon, MD 7/5/20189:12 AM

## 2017-03-23 NOTE — Progress Notes (Signed)
Patient complains of generalized pain all over. Dr. Marin Olp aware and will give Norco 5-325 PO once while he is here today.

## 2017-03-24 ENCOUNTER — Encounter: Payer: Self-pay | Admitting: Adult Health

## 2017-03-24 ENCOUNTER — Non-Acute Institutional Stay (SKILLED_NURSING_FACILITY): Payer: PPO | Admitting: Adult Health

## 2017-03-24 DIAGNOSIS — I5032 Chronic diastolic (congestive) heart failure: Secondary | ICD-10-CM

## 2017-03-24 DIAGNOSIS — N390 Urinary tract infection, site not specified: Secondary | ICD-10-CM | POA: Diagnosis not present

## 2017-03-24 DIAGNOSIS — R05 Cough: Secondary | ICD-10-CM | POA: Diagnosis not present

## 2017-03-24 DIAGNOSIS — R338 Other retention of urine: Secondary | ICD-10-CM | POA: Diagnosis not present

## 2017-03-24 DIAGNOSIS — R059 Cough, unspecified: Secondary | ICD-10-CM

## 2017-03-24 DIAGNOSIS — I952 Hypotension due to drugs: Secondary | ICD-10-CM

## 2017-03-24 DIAGNOSIS — N400 Enlarged prostate without lower urinary tract symptoms: Secondary | ICD-10-CM | POA: Diagnosis not present

## 2017-03-24 LAB — KAPPA/LAMBDA LIGHT CHAINS
Ig Kappa Free Light Chain: 79.6 mg/L — ABNORMAL HIGH (ref 3.3–19.4)
Ig Lambda Free Light Chain: 86.4 mg/L — ABNORMAL HIGH (ref 5.7–26.3)
Kappa/Lambda FluidC Ratio: 0.92 (ref 0.26–1.65)

## 2017-03-24 LAB — IGG, IGA, IGM
IGM (IMMUNOGLOBIN M), SRM: 61 mg/dL (ref 15–143)
IgA, Qn, Serum: 379 mg/dL (ref 61–437)

## 2017-03-24 NOTE — Progress Notes (Signed)
DATE:  03/24/2017   MRN:  [id number]  BIRTHDAY: [birth year]  Facility:  Nursing Home Location:  [facility] Room Number: 809-X  LEVEL OF CARE:  SNF (31)  Contact Information    Name Relation Home Work Mobile   Legrande,Helen Spouse [phone]  [phone]   [place] Son [phone]         Code Status History    Date Active Date Inactive Code Status Order ID Comments User Context   03/04/2017  8:53 AM 03/09/2017  7:01 PM DNR [id number]  Bonnielee Haff, MD Inpatient   12/29/2016  2:15 AM 01/03/2017  8:09 PM DNR [id number]  Rise Patience, MD Inpatient   10/30/2016  2:19 AM 11/07/2016  5:08 PM Full Code [id number]  Rexanne Mano, MD ED   09/14/2016 10:33 PM 09/23/2016  9:40 PM Full Code [id number]  Ivor Costa, MD ED    Questions for Most Recent Historical Code Status (Order [id number])    Question Answer Comment   In the event of cardiac or respiratory ARREST Do not call a "code blue"    In the event of cardiac or respiratory ARREST Do not perform Intubation, CPR, defibrillation or ACLS    In the event of cardiac or respiratory ARREST Use medication by any route, position, wound care, and other measures to relive pain and suffering. May use oxygen, suction and manual treatment of airway obstruction as needed for comfort.         Advance Directive Documentation     Most Recent Value  Type of Advance Directive  Out of facility DNR (pink MOST or yellow form)  Pre-existing out of facility DNR order (yellow form or pink MOST form)  -  "MOST" Form in Place?  -       Chief Complaint  Patient presents with  . Acute Visit    Hypotension    HISTORY OF PRESENT ILLNESS:  This is an 44-YO male seen for an acute visit secondary to hypotension.  A BP of 90/39 was noted 03/23/2017 in his oncologist's office. Today BP is 140/84. He was seen in the room today. Noted to have productive cough with slight yellowish phlegm. No reported fever. He has PMH of COPD,  history of OSA/OHSon BiPAP at night and O2 at daytime, chronic kidney disease stage III, history of gout on allopurinol, history of diabetes mellitus on insulin, history of chronic diastolic CHF with pulmonary hypertension.   PAST MEDICAL HISTORY:  Past Medical History:  Diagnosis Date  . Atrial fibrillation ([place])   . Cerebrovascular disease   . CHF (congestive heart failure) ([place])   . CLL (chronic lymphocytic leukemia) ([place]) 11/17/2014  . COPD (chronic obstructive pulmonary disease) ([place])   . History of thrombocytopenia   . Obesity   . Osteoarthritis   . Other and unspecified hyperlipidemia   . Type II or unspecified type diabetes mellitus without mention of complication, not stated as uncontrolled   . Unspecified essential hypertension      CURRENT MEDICATIONS: Reviewed  Patient's Medications  New Prescriptions   No medications on file  Previous Medications   ALBUTEROL (VENTOLIN HFA) 108 (90 BASE) MCG/ACT INHALER    Inhale 2 puffs into the lungs every 6 (six) hours as needed for wheezing or shortness of breath.   ALLOPURINOL (ZYLOPRIM) 100 MG TABLET    Take 1 tablet (100 mg total) by mouth daily.   ALUMINUM-MAGNESIUM HYDROXIDE-SIMETHICONE (MAALOX) 637-858-85 MG/5ML SUSP    Take 30 mLs by  mouth every 4 (four) hours as needed (INDIGESTION OR HEARTBURN).   AMINO ACIDS-PROTEIN HYDROLYS (FEEDING SUPPLEMENT, PRO-STAT SUGAR FREE 64,) LIQD    Take 30 mLs by mouth daily.   ANTISEPTIC ORAL RINSE (BIOTENE) LIQD    15 mLs by Mouth Rinse route every 6 (six) hours as needed for dry mouth.   ATORVASTATIN (LIPITOR) 10 MG TABLET    Take 1 tablet (10 mg total) by mouth daily at 6 PM.   BISACODYL (DULCOLAX) 10 MG SUPPOSITORY    Place 10 mg rectally daily as needed for moderate constipation.   BUDESONIDE-FORMOTEROL (SYMBICORT) 160-4.5 MCG/ACT INHALER    Inhale 2 puffs into the lungs 2 (two) times daily. 2 puffs twice daily   DIVALPROEX (DEPAKOTE) 250 MG DR TABLET    Take 250 mg by mouth 2 (two) times  daily. 12PM and 6PM   HYDROCODONE-ACETAMINOPHEN (NORCO/VICODIN) 5-325 MG TABLET    Take 1 tablet by mouth every 8 (eight) hours as needed for moderate pain.   INSULIN GLARGINE (LANTUS SOLOSTAR) 100 UNIT/ML SOLOSTAR PEN    Inject 16 Units into the skin daily at 10 pm.   INSULIN LISPRO (HUMALOG) 100 UNIT/ML INJECTION    Inject 5 Units into the skin 2 (two) times daily as needed for high blood sugar (If CBG >200). QAM and QHS   IPRATROPIUM-ALBUTEROL (DUONEB) 0.5-2.5 (3) MG/3ML SOLN    Take 3 mLs by nebulization every 6 (six) hours as needed (COPD).   MAGNESIUM HYDROXIDE (MILK OF MAGNESIA) 400 MG/5ML SUSPENSION    Take 30 mLs by mouth every 4 (four) hours as needed for heartburn or indigestion.    MELATONIN 3 MG TABS    Take 6 mg by mouth at bedtime as needed (sleep).    METOPROLOL SUCCINATE (TOPROL-XL) 25 MG 24 HR TABLET    Take 25 mg by mouth daily.    MISC NATURAL PRODUCTS (TART CHERRY ADVANCED) CAPS    Take 1 capsule by mouth daily.   NITROGLYCERIN (NITROSTAT) 0.4 MG SL TABLET    Place 1 tablet (0.4 mg total) under the tongue every 5 (five) minutes as needed for chest pain.   NUTRITIONAL SUPPLEMENT LIQD    Take 120 mLs by mouth 3 (three) times daily. MedPass   OXYGEN    Inhale 3 L into the lungs continuous.   POLYETHYLENE GLYCOL (MIRALAX / GLYCOLAX) PACKET    Take 17 g by mouth daily.   POTASSIUM CHLORIDE SA (K-DUR,KLOR-CON) 20 MEQ TABLET    Take 40 mEq by mouth 2 (two) times daily.   SENNA-DOCUSATE (SENOKOT-S) 8.6-50 MG TABLET    Take 1 tablet by mouth at bedtime as needed for mild constipation.   SODIUM PHOSPHATES (RA SALINE ENEMA RE)    Place 1 Applicatorful rectally daily as needed (constipation).   TIOTROPIUM (SPIRIVA) 18 MCG INHALATION CAPSULE    Place 1 capsule (18 mcg total) into inhaler and inhale daily.   TORSEMIDE (DEMADEX) 20 MG TABLET    Take 3 tablets (60 mg total) by mouth daily.   WARFARIN (COUMADIN) 2.5 MG TABLET    Take 2.5 mg by mouth See admin instructions. Take Tue-Thu-Sat    WARFARIN (COUMADIN) 5 MG TABLET    Take 5 mg by mouth. Take M-W-F-Sun  Modified Medications   No medications on file  Discontinued Medications   NYSTATIN CREAM (MYCOSTATIN)    Apply 1 application topically 2 (two) times daily. Apply to gluteal fold/buttocks   POTASSIUM CHLORIDE SA (K-DUR,KLOR-CON) 20 MEQ TABLET    Take 1  tablet (20 mEq total) by mouth 2 (two) times daily.   PREDNISONE (DELTASONE) 10 MG TABLET    Take 10 mg by mouth daily with breakfast.     Allergies  Allergen Reactions  . Quinine Palpitations and Other (See Comments)    Seizure   . Uloric [Febuxostat] Rash     REVIEW OF SYSTEMS:  GENERAL: no fever or chills  EYES: Denies change in vision, dry eyes, eye pain, itching or discharge EARS: Denies change in hearing, ringing in ears, or earache NOSE: Denies nasal congestion or epistaxis MOUTH and THROAT: Denies oral discomfort, gingival pain or bleeding, pain from teeth or hoarseness   RESPIRATORY: no hemoptysis, +cough CARDIAC: no chest pain, edema or palpitations GI: no abdominal pain, diarrhea, constipation, heart burn, nausea or vomiting GU: Denies dysuria, frequency, hematuria, incontinence, or discharge PSYCHIATRIC: Denies feeling of depression or anxiety. No report of hallucinations, insomnia, paranoia, or agitation   PHYSICAL EXAMINATION  GENERAL APPEARANCE: Well nourished. In no acute distress. Normal body habitus SKIN:  Scabs on left forearm and bilateral shins HEAD: Normal in size and contour. No evidence of trauma EYES: Lids open and close normally. No blepharitis, entropion or ectropion. PERRL. Conjunctivae are clear and sclerae are white. Lenses are without opacity EARS: Pinnae are normal. Patient hears normal voice tunes of the examiner MOUTH and THROAT: Lips are without lesions. Oral mucosa is moist and without lesions. Tongue is normal in shape, size, and color and without lesions NECK: supple, trachea midline, no neck masses, no thyroid tenderness,  no thyromegaly LYMPHATICS: no LAN in the neck, no supraclavicular LAN RESPIRATORY: breathing is even & unlabored, BS CTAB CARDIAC: Irregular heart rate, no murmur,no extra heart sounds, no edema GI: abdomen soft, normal BS, no masses, no tenderness, no hepatomegaly, no splenomegaly EXTREMITIES:  Able to move X 4 extremities PSYCHIATRIC: Alert to self and time, disoriented to place. Affect and behavior are appropriate   LABS/RADIOLOGY: Labs reviewed: Basic Metabolic Panel:  Recent Labs  10/30/16 0238  12/31/16 0322  01/03/17 0404  03/07/17 0338  03/08/17 1003 03/13/17 03/23/17 0817  NA 134*  < > 135  < > 140  < > 136  --  139 142 138  K 3.8  < > 4.0  < > 3.4*  < > 4.2  --  3.1* 4.5 4.3  CL 83*  < > 88*  < > 86*  < > 96*  --  94*  --  95*  CO2 33*  < > 36*  < > 44*  < > 32  --  38*  --  34*  GLUCOSE 376*  < > 276*  < > 173*  < > 242*  --  237*  --  174*  BUN 105*  < > 60*  < > 57*  < > 48*  --  40* 39* 25*  CREATININE 2.21*  < > 1.66*  < > 1.28*  < > 1.44*  < > 1.29* 1.2 1.0  CALCIUM 8.5*  < > 8.5*  < > 8.9  < > 8.3*  --  8.5*  --  8.9  MG 1.9  < > 2.6*  --  2.3  --   --   --  2.1  --   --   PHOS 4.6  --   --   --   --   --   --   --  2.5  --   --   < > = values in this interval  not displayed. Liver Function Tests:  Recent Labs  03/04/17 0250 03/08/17 1003 03/23/17 0817  AST 22 41 21  ALT 10* 33 21  ALKPHOS 71 63 72  BILITOT 0.8 0.8 1.20  PROT 7.7 6.6 6.8  ALBUMIN 2.7* 2.6* 2.8*    Recent Labs  10/30/16 0903  AMMONIA 19   CBC:  Recent Labs  03/06/17 0757 03/08/17 1003 03/13/17 03/16/17 03/23/17 0817  WBC 20.1* 21.1* 17.0 19.0 19.8*  NEUTROABS  --  11.8* 8 11 10.3*  HGB 11.6* 13.0 14.2 16.0 14.5  HCT 34.9* 39.1 43 49 44.1  MCV 90.9 91.1  --   --  96  PLT 187 188 167 157 109*   Lipid Panel:  Recent Labs  09/15/16 0434  HDL 23*   Cardiac Enzymes:  Recent Labs  03/04/17 0250 03/04/17 1213 03/04/17 1821  TROPONINI 0.03* 0.04* 0.03*    CBG:  Recent Labs  03/08/17 2231 03/09/17 0753 03/09/17 1216  GLUCAP 346* 118* 181*      Dg Wrist 2 Views Right  Result Date: 03/04/2017 CLINICAL DATA:  Pain in right wrist due to gout per pt ; no recent injury; pt id checked with RN EXAM: RIGHT WRIST - 2 VIEW COMPARISON:  12/29/2016, 12/30/2016 MRI FINDINGS: There is no acute fracture or dislocation. There are well corticated erosions in the interphalangeal joint of the thumb and the metacarpal phalangeal joints, consistent with the history of gout. Bone mineral density is normal. There is atherosclerotic calcification of the small arteries. Soft tissue swelling of the thumb. Joint space narrowing and corticated erosions of the first carpometacarpal joint. IMPRESSION: 1.  No evidence for acute  abnormality. 2. Findings are compatible with gout arthropathy and appear stable. Electronically Signed   By: Nolon Nations M.D.   On: 03/04/2017 11:21   Ct Angio Chest Pe W/cm &/or Wo Cm  Result Date: 03/04/2017 CLINICAL DATA:  Acute onset of generalized chest pain and shortness of breath. Tachycardia. Decreased O2 saturation. Initial encounter. EXAM: CT ANGIOGRAPHY CHEST WITH CONTRAST TECHNIQUE: Multidetector CT imaging of the chest was performed using the standard protocol during bolus administration of intravenous contrast. Multiplanar CT image reconstructions and MIPs were obtained to evaluate the vascular anatomy. CONTRAST:  80 mL of Isovue 370 IV contrast COMPARISON:  Chest radiograph performed earlier today at 2:33 a.m., and CT of the chest performed 09/19/2016 FINDINGS: Cardiovascular:  There is no evidence of pulmonary embolus. The heart is borderline normal in size. Diffuse coronary artery calcifications are seen. Scattered calcification is noted along the thoracic aorta and proximal great vessels. Mediastinum/Nodes: Scattered enlarged mediastinal and left hilar nodes are seen, measuring up to 1.9 cm in short axis, particularly at the right  paratracheal region and azygoesophageal recess. No pericardial effusion is identified. The thyroid gland is unremarkable. No axillary lymphadenopathy is appreciated. Lungs/Pleura: Mild scattered atelectasis is noted bilaterally. The lungs are difficult to fully assess due to motion artifact. No pleural effusion or pneumothorax is seen. No masses are identified. Upper Abdomen: The visualized portions of the liver and spleen are grossly unremarkable. Stones are noted dependently within the gallbladder. The gallbladder is mildly distended but otherwise unremarkable. The visualized portions of the pancreas and adrenal glands are within normal limits. Nonspecific perinephric stranding is noted bilaterally. Musculoskeletal: No acute osseous abnormalities are identified. The visualized musculature is unremarkable in appearance. Review of the MIP images confirms the above findings. IMPRESSION: 1. No evidence of pulmonary embolus. 2. Mild scattered atelectasis noted bilaterally. 3. Enlarged mediastinal  and left hilar nodes, measuring up to 1.9 cm in short axis. These are persistent from the prior study, and could reflect underlying lymphoma or inflammatory disease. Several nodes would be amenable to biopsy, as deemed clinically appropriate. 4. Diffuse coronary artery calcifications seen. 5. Cholelithiasis. Gallbladder mildly distended but otherwise unremarkable. Electronically Signed   By: Garald Balding M.D.   On: 03/04/2017 05:24   Dg Chest Portable 1 View  Result Date: 03/04/2017 CLINICAL DATA:  Status post fall out of bed, with shortness of breath and right shoulder pain. Initial encounter. EXAM: PORTABLE CHEST 1 VIEW COMPARISON:  Chest radiograph performed 01/02/2017 FINDINGS: There is mild elevation of the right hemidiaphragm. Vascular congestion is noted. Mild interstitial prominence may reflect transient interstitial edema. No pneumothorax is seen. The cardiomediastinal silhouette is normal in size. No acute  osseous abnormalities are identified. IMPRESSION: Mild elevation of the right hemidiaphragm. Vascular congestion noted. Mild interstitial prominence may reflect transient interstitial edema. No displaced rib fracture seen. Electronically Signed   By: Garald Balding M.D.   On: 03/04/2017 02:58    ASSESSMENT/PLAN:  Hypotension - decrease Metoprolol succinate ERto 25 mg give 1/2 tab = 12.5 mg PO Q D; BP/HR BID X 1 week  Cough - start Mucinex ER 600 mg PO BID X 1 week; will do CXR to R/O pneumonia  Chronic diastolic CHF - continue Torsemide 20 mg give 3 tabs = 60 mg PO Q D and Metoprolol succinate ER 25 mg give 1/2 tab = 12.5 mg PO Q D      Rayaan Lorah C. Garner - NP    [employer] [phone]

## 2017-03-27 ENCOUNTER — Inpatient Hospital Stay (HOSPITAL_COMMUNITY)
Admission: EM | Admit: 2017-03-27 | Discharge: 2017-04-19 | DRG: 871 | Disposition: E | Payer: PPO | Attending: Family Medicine | Admitting: Family Medicine

## 2017-03-27 ENCOUNTER — Other Ambulatory Visit: Payer: Self-pay

## 2017-03-27 ENCOUNTER — Emergency Department (HOSPITAL_COMMUNITY): Payer: PPO

## 2017-03-27 ENCOUNTER — Inpatient Hospital Stay (HOSPITAL_COMMUNITY): Payer: PPO

## 2017-03-27 ENCOUNTER — Encounter (HOSPITAL_COMMUNITY): Payer: Self-pay | Admitting: *Deleted

## 2017-03-27 DIAGNOSIS — I130 Hypertensive heart and chronic kidney disease with heart failure and stage 1 through stage 4 chronic kidney disease, or unspecified chronic kidney disease: Secondary | ICD-10-CM | POA: Diagnosis present

## 2017-03-27 DIAGNOSIS — N183 Chronic kidney disease, stage 3 (moderate): Secondary | ICD-10-CM | POA: Diagnosis not present

## 2017-03-27 DIAGNOSIS — I5032 Chronic diastolic (congestive) heart failure: Secondary | ICD-10-CM | POA: Diagnosis present

## 2017-03-27 DIAGNOSIS — C9110 Chronic lymphocytic leukemia of B-cell type not having achieved remission: Secondary | ICD-10-CM | POA: Diagnosis not present

## 2017-03-27 DIAGNOSIS — Z66 Do not resuscitate: Secondary | ICD-10-CM | POA: Diagnosis present

## 2017-03-27 DIAGNOSIS — E1142 Type 2 diabetes mellitus with diabetic polyneuropathy: Secondary | ICD-10-CM | POA: Diagnosis not present

## 2017-03-27 DIAGNOSIS — R000 Tachycardia, unspecified: Secondary | ICD-10-CM | POA: Diagnosis present

## 2017-03-27 DIAGNOSIS — Z794 Long term (current) use of insulin: Secondary | ICD-10-CM

## 2017-03-27 DIAGNOSIS — Z87891 Personal history of nicotine dependence: Secondary | ICD-10-CM

## 2017-03-27 DIAGNOSIS — J189 Pneumonia, unspecified organism: Secondary | ICD-10-CM

## 2017-03-27 DIAGNOSIS — X58XXXA Exposure to other specified factors, initial encounter: Secondary | ICD-10-CM | POA: Diagnosis present

## 2017-03-27 DIAGNOSIS — Z7901 Long term (current) use of anticoagulants: Secondary | ICD-10-CM | POA: Diagnosis not present

## 2017-03-27 DIAGNOSIS — M1990 Unspecified osteoarthritis, unspecified site: Secondary | ICD-10-CM | POA: Diagnosis not present

## 2017-03-27 DIAGNOSIS — A419 Sepsis, unspecified organism: Principal | ICD-10-CM

## 2017-03-27 DIAGNOSIS — S50812A Abrasion of left forearm, initial encounter: Secondary | ICD-10-CM | POA: Diagnosis present

## 2017-03-27 DIAGNOSIS — I248 Other forms of acute ischemic heart disease: Secondary | ICD-10-CM | POA: Diagnosis present

## 2017-03-27 DIAGNOSIS — E860 Dehydration: Secondary | ICD-10-CM | POA: Diagnosis not present

## 2017-03-27 DIAGNOSIS — Z8249 Family history of ischemic heart disease and other diseases of the circulatory system: Secondary | ICD-10-CM

## 2017-03-27 DIAGNOSIS — R05 Cough: Secondary | ICD-10-CM | POA: Diagnosis present

## 2017-03-27 DIAGNOSIS — N179 Acute kidney failure, unspecified: Secondary | ICD-10-CM | POA: Diagnosis not present

## 2017-03-27 DIAGNOSIS — R059 Cough, unspecified: Secondary | ICD-10-CM

## 2017-03-27 DIAGNOSIS — Z79899 Other long term (current) drug therapy: Secondary | ICD-10-CM

## 2017-03-27 DIAGNOSIS — R4182 Altered mental status, unspecified: Secondary | ICD-10-CM

## 2017-03-27 DIAGNOSIS — R0602 Shortness of breath: Secondary | ICD-10-CM | POA: Diagnosis not present

## 2017-03-27 DIAGNOSIS — J9600 Acute respiratory failure, unspecified whether with hypoxia or hypercapnia: Secondary | ICD-10-CM | POA: Diagnosis not present

## 2017-03-27 DIAGNOSIS — G4733 Obstructive sleep apnea (adult) (pediatric): Secondary | ICD-10-CM | POA: Diagnosis present

## 2017-03-27 DIAGNOSIS — I480 Paroxysmal atrial fibrillation: Secondary | ICD-10-CM | POA: Diagnosis present

## 2017-03-27 DIAGNOSIS — Z888 Allergy status to other drugs, medicaments and biological substances status: Secondary | ICD-10-CM

## 2017-03-27 DIAGNOSIS — E1122 Type 2 diabetes mellitus with diabetic chronic kidney disease: Secondary | ICD-10-CM | POA: Diagnosis not present

## 2017-03-27 DIAGNOSIS — F0390 Unspecified dementia without behavioral disturbance: Secondary | ICD-10-CM | POA: Diagnosis not present

## 2017-03-27 DIAGNOSIS — E875 Hyperkalemia: Secondary | ICD-10-CM

## 2017-03-27 DIAGNOSIS — J449 Chronic obstructive pulmonary disease, unspecified: Secondary | ICD-10-CM | POA: Diagnosis not present

## 2017-03-27 DIAGNOSIS — R402411 Glasgow coma scale score 13-15, in the field [EMT or ambulance]: Secondary | ICD-10-CM | POA: Diagnosis not present

## 2017-03-27 DIAGNOSIS — Z7951 Long term (current) use of inhaled steroids: Secondary | ICD-10-CM

## 2017-03-27 DIAGNOSIS — L8990 Pressure ulcer of unspecified site, unspecified stage: Secondary | ICD-10-CM | POA: Insufficient documentation

## 2017-03-27 HISTORY — DX: Acute kidney failure, unspecified: N17.9

## 2017-03-27 LAB — COMPREHENSIVE METABOLIC PANEL
ALK PHOS: 69 U/L (ref 38–126)
ALT: 13 U/L — AB (ref 17–63)
ANION GAP: 10 (ref 5–15)
AST: 27 U/L (ref 15–41)
Albumin: 2.4 g/dL — ABNORMAL LOW (ref 3.5–5.0)
BUN: 38 mg/dL — ABNORMAL HIGH (ref 6–20)
CO2: 29 mmol/L (ref 22–32)
CREATININE: 1.77 mg/dL — AB (ref 0.61–1.24)
Calcium: 8.5 mg/dL — ABNORMAL LOW (ref 8.9–10.3)
Chloride: 97 mmol/L — ABNORMAL LOW (ref 101–111)
GFR, EST AFRICAN AMERICAN: 40 mL/min — AB (ref >60)
GFR, EST NON AFRICAN AMERICAN: 34 mL/min — AB (ref >60)
Glucose, Bld: 211 mg/dL — ABNORMAL HIGH (ref 65–99)
Potassium: 6 mmol/L — ABNORMAL HIGH (ref 3.5–5.1)
Sodium: 136 mmol/L (ref 135–145)
TOTAL PROTEIN: 6.6 g/dL (ref 6.5–8.1)
Total Bilirubin: 0.8 mg/dL (ref 0.3–1.2)

## 2017-03-27 LAB — CBC WITH DIFFERENTIAL/PLATELET
BASOS ABS: 0 10*3/uL (ref 0.0–0.1)
Basophils Relative: 0 %
EOS ABS: 0 10*3/uL (ref 0.0–0.7)
EOS PCT: 0 %
HCT: 40.6 % (ref 39.0–52.0)
Hemoglobin: 13 g/dL (ref 13.0–17.0)
Lymphocytes Relative: 23 %
Lymphs Abs: 5.2 10*3/uL — ABNORMAL HIGH (ref 0.7–4.0)
MCH: 30.4 pg (ref 26.0–34.0)
MCHC: 32 g/dL (ref 30.0–36.0)
MCV: 95.1 fL (ref 78.0–100.0)
MONO ABS: 2 10*3/uL — AB (ref 0.1–1.0)
Monocytes Relative: 9 %
Neutro Abs: 15.4 10*3/uL — ABNORMAL HIGH (ref 1.7–7.7)
Neutrophils Relative %: 68 %
PLATELETS: 101 10*3/uL — AB (ref 150–400)
RBC: 4.27 MIL/uL (ref 4.22–5.81)
RDW: 17.8 % — ABNORMAL HIGH (ref 11.5–15.5)
WBC: 22.7 10*3/uL — AB (ref 4.0–10.5)

## 2017-03-27 LAB — BASIC METABOLIC PANEL
ANION GAP: 12 (ref 5–15)
BUN: 36 mg/dL — ABNORMAL HIGH (ref 6–20)
CHLORIDE: 98 mmol/L — AB (ref 101–111)
CO2: 28 mmol/L (ref 22–32)
Calcium: 8.6 mg/dL — ABNORMAL LOW (ref 8.9–10.3)
Creatinine, Ser: 1.55 mg/dL — ABNORMAL HIGH (ref 0.61–1.24)
GFR, EST AFRICAN AMERICAN: 47 mL/min — AB (ref >60)
GFR, EST NON AFRICAN AMERICAN: 40 mL/min — AB (ref >60)
Glucose, Bld: 93 mg/dL (ref 65–99)
POTASSIUM: 5.3 mmol/L — AB (ref 3.5–5.1)
SODIUM: 138 mmol/L (ref 135–145)

## 2017-03-27 LAB — PROTIME-INR
INR: 2.33
PROTHROMBIN TIME: 26 s — AB (ref 11.4–15.2)

## 2017-03-27 LAB — I-STAT CG4 LACTIC ACID, ED
LACTIC ACID, VENOUS: 3.41 mmol/L — AB (ref 0.5–1.9)
LACTIC ACID, VENOUS: 2.75 mmol/L — AB (ref 0.5–1.9)

## 2017-03-27 LAB — URINALYSIS, ROUTINE W REFLEX MICROSCOPIC
Bilirubin Urine: NEGATIVE
Glucose, UA: 50 mg/dL — AB
Ketones, ur: NEGATIVE mg/dL
Nitrite: NEGATIVE
PH: 5 (ref 5.0–8.0)
Protein, ur: 30 mg/dL — AB
SPECIFIC GRAVITY, URINE: 1.012 (ref 1.005–1.030)

## 2017-03-27 LAB — GLUCOSE, CAPILLARY
GLUCOSE-CAPILLARY: 79 mg/dL (ref 65–99)
GLUCOSE-CAPILLARY: 163 mg/dL — AB (ref 65–99)

## 2017-03-27 LAB — CBG MONITORING, ED: GLUCOSE-CAPILLARY: 105 mg/dL — AB (ref 65–99)

## 2017-03-27 LAB — TROPONIN I
Troponin I: 0.41 ng/mL (ref <0.03)
Troponin I: 0.39 ng/mL (ref <0.03)

## 2017-03-27 LAB — MRSA PCR SCREENING: MRSA by PCR: NEGATIVE

## 2017-03-27 LAB — SODIUM, URINE, RANDOM: Sodium, Ur: <10 mmol/L

## 2017-03-27 MED ORDER — DEXTROSE 5 % IV SOLN
2.0000 g | INTRAVENOUS | Status: DC
  Administered 2017-03-27: 2 g via INTRAVENOUS
  Filled 2017-03-27 (×2): qty 2

## 2017-03-27 MED ORDER — METOPROLOL SUCCINATE ER 25 MG PO TB24
25.0000 mg | ORAL_TABLET | Freq: Every day | ORAL | Status: DC
  Administered 2017-03-27: 25 mg via ORAL
  Filled 2017-03-27: qty 1

## 2017-03-27 MED ORDER — VANCOMYCIN HCL IN DEXTROSE 1-5 GM/200ML-% IV SOLN
1000.0000 mg | Freq: Once | INTRAVENOUS | Status: AC
  Administered 2017-03-27: 1000 mg via INTRAVENOUS
  Filled 2017-03-27: qty 200

## 2017-03-27 MED ORDER — SODIUM CHLORIDE 0.9 % IV SOLN
INTRAVENOUS | Status: DC
  Administered 2017-03-27: 14:00:00 via INTRAVENOUS

## 2017-03-27 MED ORDER — TIOTROPIUM BROMIDE MONOHYDRATE 18 MCG IN CAPS
18.0000 ug | ORAL_CAPSULE | Freq: Every day | RESPIRATORY_TRACT | Status: DC
Start: 2017-03-27 — End: 2017-03-28
  Filled 2017-03-27: qty 5

## 2017-03-27 MED ORDER — INSULIN ASPART 100 UNIT/ML ~~LOC~~ SOLN: 0.0000 [IU] | Freq: Three times a day (TID) | SUBCUTANEOUS | Status: DC

## 2017-03-27 MED ORDER — NITROGLYCERIN 0.4 MG SL SUBL: 0.4000 mg | SUBLINGUAL_TABLET | SUBLINGUAL | Status: DC | PRN

## 2017-03-27 MED ORDER — SODIUM CHLORIDE 0.9 % IV SOLN
1.0000 g | Freq: Once | INTRAVENOUS | Status: AC
  Administered 2017-03-27: 1 g via INTRAVENOUS
  Filled 2017-03-27: qty 10

## 2017-03-27 MED ORDER — FUROSEMIDE 10 MG/ML IJ SOLN
30.0000 mg | Freq: Once | INTRAMUSCULAR | Status: AC
Start: 2017-03-27 — End: 2017-03-27
  Administered 2017-03-27: 30 mg via INTRAVENOUS
  Filled 2017-03-27: qty 4

## 2017-03-27 MED ORDER — IPRATROPIUM-ALBUTEROL 0.5-2.5 (3) MG/3ML IN SOLN
3.0000 mL | Freq: Four times a day (QID) | RESPIRATORY_TRACT | Status: DC | PRN
  Administered 2017-03-27: 3 mL via RESPIRATORY_TRACT
  Filled 2017-03-27: qty 3

## 2017-03-27 MED ORDER — WARFARIN - PHARMACIST DOSING INPATIENT
Freq: Every day | Status: DC
  Administered 2017-03-27: 18:00:00

## 2017-03-27 MED ORDER — SODIUM BICARBONATE 8.4 % IV SOLN
50.0000 meq | Freq: Once | INTRAVENOUS | Status: AC
  Administered 2017-03-27: 50 meq via INTRAVENOUS
  Filled 2017-03-27: qty 50

## 2017-03-27 MED ORDER — SODIUM POLYSTYRENE SULFONATE 15 GM/60ML PO SUSP
45.0000 g | Freq: Once | ORAL | Status: AC
  Administered 2017-03-27: 45 g via ORAL

## 2017-03-27 MED ORDER — MELATONIN 3 MG PO TABS
6.0000 mg | ORAL_TABLET | Freq: Every evening | ORAL | Status: DC | PRN
  Filled 2017-03-27: qty 2

## 2017-03-27 MED ORDER — DIVALPROEX SODIUM 250 MG PO DR TAB
250.0000 mg | DELAYED_RELEASE_TABLET | Freq: Two times a day (BID) | ORAL | Status: DC
  Administered 2017-03-27: 250 mg via ORAL
  Filled 2017-03-27 (×2): qty 1

## 2017-03-27 MED ORDER — GUAIFENESIN-DM 100-10 MG/5ML PO SYRP
15.0000 mL | ORAL_SOLUTION | Freq: Four times a day (QID) | ORAL | Status: DC
  Administered 2017-03-27 – 2017-03-28 (×2): 15 mL via ORAL
  Filled 2017-03-27 (×2): qty 15

## 2017-03-27 MED ORDER — DEXTROSE 5 % IV SOLN
1.0000 g | Freq: Once | INTRAVENOUS | Status: AC
  Administered 2017-03-27: 1 g via INTRAVENOUS
  Filled 2017-03-27: qty 10

## 2017-03-27 MED ORDER — BISACODYL 10 MG RE SUPP: 10.0000 mg | Freq: Every day | RECTAL | Status: DC | PRN

## 2017-03-27 MED ORDER — IPRATROPIUM-ALBUTEROL 0.5-2.5 (3) MG/3ML IN SOLN
3.0000 mL | Freq: Four times a day (QID) | RESPIRATORY_TRACT | Status: DC
  Administered 2017-03-27 (×2): 3 mL via RESPIRATORY_TRACT
  Filled 2017-03-27 (×3): qty 3

## 2017-03-27 MED ORDER — ALBUTEROL SULFATE (2.5 MG/3ML) 0.083% IN NEBU: 3.0000 mL | INHALATION_SOLUTION | Freq: Four times a day (QID) | RESPIRATORY_TRACT | Status: DC | PRN

## 2017-03-27 MED ORDER — SODIUM CHLORIDE 0.9% FLUSH
3.0000 mL | Freq: Two times a day (BID) | INTRAVENOUS | Status: DC
  Administered 2017-03-27 (×2): 3 mL via INTRAVENOUS

## 2017-03-27 MED ORDER — ACETAMINOPHEN 325 MG PO TABS: 650.0000 mg | ORAL_TABLET | Freq: Four times a day (QID) | ORAL | Status: DC | PRN

## 2017-03-27 MED ORDER — POLYETHYLENE GLYCOL 3350 17 G PO PACK
17.0000 g | PACK | Freq: Every day | ORAL | Status: DC
  Administered 2017-03-27: 17 g via ORAL
  Filled 2017-03-27: qty 1

## 2017-03-27 MED ORDER — INSULIN GLARGINE 100 UNIT/ML ~~LOC~~ SOLN
16.0000 [IU] | Freq: Every day | SUBCUTANEOUS | Status: DC
  Filled 2017-03-27: qty 0.16

## 2017-03-27 MED ORDER — SODIUM CHLORIDE 0.9 % IV SOLN
INTRAVENOUS | Status: DC
  Administered 2017-03-27: 15:00:00 via INTRAVENOUS

## 2017-03-27 MED ORDER — ACETAMINOPHEN 650 MG RE SUPP: 650.0000 mg | Freq: Four times a day (QID) | RECTAL | Status: DC | PRN

## 2017-03-27 MED ORDER — ALLOPURINOL 100 MG PO TABS
100.0000 mg | ORAL_TABLET | Freq: Every day | ORAL | Status: DC
  Administered 2017-03-27: 100 mg via ORAL
  Filled 2017-03-27: qty 1

## 2017-03-27 MED ORDER — SODIUM CHLORIDE 0.9 % IV SOLN
1250.0000 mg | INTRAVENOUS | Status: DC
Start: 2017-03-28 — End: 2017-03-28
  Filled 2017-03-27: qty 1250

## 2017-03-27 MED ORDER — INSULIN GLARGINE 100 UNIT/ML ~~LOC~~ SOLN
14.0000 [IU] | Freq: Every day | SUBCUTANEOUS | Status: DC
  Administered 2017-03-27: 14 [IU] via SUBCUTANEOUS
  Filled 2017-03-27: qty 0.14

## 2017-03-27 MED ORDER — BIOTENE DRY MOUTH MT LIQD: 15.0000 mL | Freq: Four times a day (QID) | OROMUCOSAL | Status: DC | PRN

## 2017-03-27 MED ORDER — SODIUM POLYSTYRENE SULFONATE 15 GM/60ML PO SUSP
30.0000 g | Freq: Once | ORAL | Status: DC
  Filled 2017-03-27: qty 120

## 2017-03-27 MED ORDER — ALUM & MAG HYDROXIDE-SIMETH 200-200-20 MG/5ML PO SUSP: 30.0000 mL | ORAL | Status: DC | PRN

## 2017-03-27 MED ORDER — SENNOSIDES-DOCUSATE SODIUM 8.6-50 MG PO TABS: 1.0000 | ORAL_TABLET | Freq: Every evening | ORAL | Status: DC | PRN

## 2017-03-27 MED ORDER — INSULIN GLARGINE 100 UNIT/ML SOLOSTAR PEN: 16.0000 [IU] | PEN_INJECTOR | Freq: Every day | SUBCUTANEOUS | Status: DC

## 2017-03-27 MED ORDER — ENSURE ENLIVE PO LIQD
120.0000 mL | Freq: Three times a day (TID) | ORAL | Status: DC
  Administered 2017-03-27: 120 mL via ORAL

## 2017-03-27 MED ORDER — WARFARIN SODIUM 5 MG PO TABS
5.0000 mg | ORAL_TABLET | Freq: Once | ORAL | Status: AC
  Administered 2017-03-27: 5 mg via ORAL
  Filled 2017-03-27: qty 1

## 2017-03-27 MED ORDER — ATORVASTATIN CALCIUM 10 MG PO TABS
10.0000 mg | ORAL_TABLET | Freq: Every day | ORAL | Status: DC
  Administered 2017-03-27: 10 mg via ORAL
  Filled 2017-03-27: qty 1

## 2017-03-27 MED ORDER — SODIUM CHLORIDE 0.9 % IV BOLUS (SEPSIS)
500.0000 mL | Freq: Once | INTRAVENOUS | Status: AC
  Administered 2017-03-27: 500 mL via INTRAVENOUS

## 2017-03-27 MED ORDER — SODIUM CHLORIDE 0.9 % IV SOLN
Freq: Once | INTRAVENOUS | Status: AC
  Administered 2017-03-27: 04:00:00 via INTRAVENOUS

## 2017-03-27 MED ORDER — MAGNESIUM HYDROXIDE 400 MG/5ML PO SUSP: 30.0000 mL | ORAL | Status: DC | PRN

## 2017-03-27 MED ORDER — PRO-STAT SUGAR FREE PO LIQD
30.0000 mL | Freq: Every day | ORAL | Status: DC
  Administered 2017-03-27: 30 mL via ORAL
  Filled 2017-03-27: qty 30

## 2017-03-27 MED ORDER — MOMETASONE FURO-FORMOTEROL FUM 200-5 MCG/ACT IN AERO
2.0000 | INHALATION_SPRAY | Freq: Two times a day (BID) | RESPIRATORY_TRACT | Status: DC
  Administered 2017-03-27: 2 via RESPIRATORY_TRACT
  Filled 2017-03-27: qty 8.8

## 2017-03-27 MED ORDER — HYDROCODONE-ACETAMINOPHEN 5-325 MG PO TABS
1.0000 | ORAL_TABLET | Freq: Three times a day (TID) | ORAL | Status: DC | PRN
  Administered 2017-03-27 (×2): 1 via ORAL
  Filled 2017-03-27 (×2): qty 1

## 2017-03-27 NOTE — H&P (Signed)
TRH H&P   Patient Demographics:    William Braun, is a 83 y.o. male  MRN: [id number]   DOB - 07-15-1933  Admit Date - 03/24/2017  Outpatient Primary MD for the patient is Linna Darner Darrick Penna, MD  Referring MD/NP/PA: Dannielle Huh  Outpatient Specialists:   Patient coming from: [facility]  Chief Complaint  Patient presents with  . Altered Mental Status      HPI:    William Braun  is a 83 y.o. male, w CHF, Pafib, Dm2, Copd, CLL and recent admission for urosepsis apparently presents with AMS and dyspnea.  Pt is not a very reliable historian.  Pt was brought in by EMS.     In ED, CXR no focal consolidation.  Ua=> LE moderate.  Urine culture pending, blood culture pending.  Wbc 22.7,  Bun/creat 38/1.77  K=6.0,  Pt treated with kayexalate in ED.   CT brain pending.   Pt seems more alert than described, states that he has been short of breath the past day.    Pt will be admitted for dyspnea, ARF, Hyperkalemia.    Review of systems:    In addition to the HPI above,  No Fever-chills, No Headache, No changes with Vision or hearing, No problems swallowing food or Liquids, No Chest pain, Cough No Abdominal pain, No Nausea or Vommitting, Bowel movements are regular, No Blood in stool or Urine, No dysuria, No new skin rashes or bruises, No new joints pains-aches,  No new weakness, tingling, numbness in any extremity, No recent weight gain or loss, No polyuria, polydypsia or polyphagia, No significant Mental Stressors.  A full 10 point Review of Systems was done, except as stated above, all other Review of Systems were negative.   With Past History of the following :    Past Medical History:  Diagnosis Date  . Atrial fibrillation ([place])   . Cerebrovascular disease   . CHF (congestive heart failure) ([place])   . CLL (chronic lymphocytic leukemia) ([place]) 11/17/2014  . COPD  (chronic obstructive pulmonary disease) ([place])   . History of thrombocytopenia   . Obesity   . Osteoarthritis   . Other and unspecified hyperlipidemia   . Type II or unspecified type diabetes mellitus without mention of complication, not stated as uncontrolled   . Unspecified essential hypertension       Past Surgical History:  Procedure Laterality Date  . KNEE ARTHROSCOPY W/ ALLOGRAFT IMPANT    . VASECTOMY        Social History:     Social History  Substance Use Topics  . Smoking status: Former Smoker    Packs/day: 2.00    Years: 34.00    Types: Cigarettes    Start date: 04/23/1946    Quit date: 09/19/1978  . Smokeless tobacco: Never Used     Comment: quit 35 years ago  . Alcohol use No  Lives -  At [facility] rehab presently  Mobility - walks with walker     Family History :     Family History  Problem Relation Age of Onset  . Heart disease Father       Home Medications:   Prior to Admission medications   Medication Sig Start Date End Date Taking? Authorizing Provider  albuterol (VENTOLIN HFA) 108 (90 BASE) MCG/ACT inhaler Inhale 2 puffs into the lungs every 6 (six) hours as needed for wheezing or shortness of breath. 07/23/15   Chesley Mires, MD  allopurinol (ZYLOPRIM) 100 MG tablet Take 1 tablet (100 mg total) by mouth daily. 01/18/17   Lauree Chandler, NP  aluminum-magnesium hydroxide-simethicone (MAALOX) 161-096-04 MG/5ML SUSP Take 30 mLs by mouth every 4 (four) hours as needed (INDIGESTION OR HEARTBURN).    [provider]  Amino Acids-Protein Hydrolys (FEEDING SUPPLEMENT, PRO-STAT SUGAR FREE 64,) LIQD Take 30 mLs by mouth daily.    [provider]  antiseptic oral rinse (BIOTENE) LIQD 15 mLs by Mouth Rinse route every 6 (six) hours as needed for dry mouth.    [provider]  atorvastatin (LIPITOR) 10 MG tablet Take 1 tablet (10 mg total) by mouth daily at 6 PM. 11/07/16   Lavina Hamman, MD  bisacodyl (DULCOLAX) 10 MG  suppository Place 10 mg rectally daily as needed for moderate constipation.    [provider]  budesonide-formoterol (SYMBICORT) 160-4.5 MCG/ACT inhaler Inhale 2 puffs into the lungs 2 (two) times daily. 2 puffs twice daily 11/13/15   Chesley Mires, MD  divalproex (DEPAKOTE) 250 MG DR tablet Take 250 mg by mouth 2 (two) times daily. 12PM and 6PM    [provider]  HYDROcodone-acetaminophen (NORCO/VICODIN) 5-325 MG tablet Take 1 tablet by mouth every 8 (eight) hours as needed for moderate pain. 03/09/17   Raiford Noble Latif, DO  Insulin Glargine (LANTUS SOLOSTAR) 100 UNIT/ML Solostar Pen Inject 16 Units into the skin daily at 10 pm.    [provider]  insulin lispro (HUMALOG) 100 UNIT/ML injection Inject 5 Units into the skin 2 (two) times daily as needed for high blood sugar (If CBG >200). QAM and QHS    [provider]  ipratropium-albuterol (DUONEB) 0.5-2.5 (3) MG/3ML SOLN Take 3 mLs by nebulization every 6 (six) hours as needed (COPD).    [provider]  magnesium hydroxide (MILK OF MAGNESIA) 400 MG/5ML suspension Take 30 mLs by mouth every 4 (four) hours as needed for heartburn or indigestion.     [provider]  Melatonin 3 MG TABS Take 6 mg by mouth at bedtime as needed (sleep).     [provider]  metoprolol succinate (TOPROL-XL) 25 MG 24 hr tablet Take 25 mg by mouth daily.     [provider]  Misc Natural Products (TART CHERRY ADVANCED) CAPS Take 1 capsule by mouth daily.    [provider]  nitroGLYCERIN (NITROSTAT) 0.4 MG SL tablet Place 1 tablet (0.4 mg total) under the tongue every 5 (five) minutes as needed for chest pain. 08/03/11   Richardson Dopp T, PA-C  NUTRITIONAL SUPPLEMENT LIQD Take 120 mLs by mouth 3 (three) times daily. MedPass    [provider]  OXYGEN Inhale 3 L into the lungs continuous.    [provider]  polyethylene glycol (MIRALAX / GLYCOLAX) packet Take 17 g by mouth  daily. 11/07/16   Lavina Hamman, MD  potassium chloride SA (K-DUR,KLOR-CON) 20 MEQ tablet Take 40 mEq by  mouth 2 (two) times daily.    [provider]  senna-docusate (SENOKOT-S) 8.6-50 MG tablet Take 1 tablet by mouth at bedtime as needed for mild constipation. 11/07/16   Lavina Hamman, MD  Sodium Phosphates (RA SALINE ENEMA RE) Place 1 Applicatorful rectally daily as needed (constipation).    [provider]  tiotropium (SPIRIVA) 18 MCG inhalation capsule Place 1 capsule (18 mcg total) into inhaler and inhale daily. 11/13/15   Chesley Mires, MD  torsemide (DEMADEX) 20 MG tablet Take 3 tablets (60 mg total) by mouth daily. 03/10/17   Raiford Noble Latif, DO  warfarin (COUMADIN) 2.5 MG tablet Take 2.5 mg by mouth See admin instructions. Take Tue-Thu-Sat    [provider]  warfarin (COUMADIN) 5 MG tablet Take 5 mg by mouth. Take M-W-F-Sun    [provider]     Allergies:     Allergies  Allergen Reactions  . Quinine Palpitations and Other (See Comments)    Seizure   . Uloric [Febuxostat] Rash     Physical Exam:   Vitals  Blood pressure 101/72, pulse 81, temperature 99.9 F (37.7 C), temperature source Rectal, resp. rate (!) 23, SpO2 100 %.   1. General  lying in bed in NAD,    2. Normal affect and insight, Not Suicidal or Homicidal, Awake Alert, Oriented X 3.  3. No F.N deficits, ALL C.Nerves Intact, Strength 5/5 all 4 extremities, Sensation intact all 4 extremities, Plantars down going.  4. Ears and Eyes appear Normal, Conjunctivae clear, PERRLA. Moist Oral Mucosa.  5. Supple Neck, No JVD, No cervical lymphadenopathy appriciated, No Carotid Bruits.  6. Symmetrical Chest wall movement, Good air movement bilaterally, CTAB.  7. RRR, No Gallops, Rubs or Murmurs, No Parasternal Heave.  8. Positive Bowel Sounds, Abdomen Soft, No tenderness, No organomegaly appriciated,No rebound -guarding or rigidity.  9.  No Cyanosis, Normal Skin Turgor, No  Skin Rash or Bruise.  10. Good muscle tone,  joints appear normal , no effusions, Normal ROM.  11. No Palpable Lymph Nodes in Neck or Axillae     Data Review:    CBC  Recent Labs Lab 03/23/17 0817 03/25/2017 0255  WBC 19.8* 22.7*  HGB 14.5 13.0  HCT 44.1 40.6  PLT 109* 101*  MCV 96 95.1  MCH 31.4 30.4  MCHC 32.9 32.0  RDW 18.9* 17.8*  LYMPHSABS 7.5* 5.2*  MONOABS  --  2.0*  EOSABS 0.3 0.0  BASOSABS 0.0 0.0   ------------------------------------------------------------------------------------------------------------------  Chemistries   Recent Labs Lab 03/23/17 0817 04/11/2017 0255  NA 138 136  K 4.3 6.0*  CL 95* 97*  CO2 34* 29  GLUCOSE 174* 211*  BUN 25* 38*  CREATININE 1.0 1.77*  CALCIUM 8.9 8.5*  AST 21 27  ALT 21 13*  ALKPHOS 72 69  BILITOT 1.20 0.8   ------------------------------------------------------------------------------------------------------------------ estimated creatinine clearance is 43.1 mL/min (A) (by C-G formula based on SCr of 1.77 mg/dL (H)). ------------------------------------------------------------------------------------------------------------------ No results for input(s): TSH, T4TOTAL, T3FREE, THYROIDAB in the last 72 hours.  Invalid input(s): FREET3  Coagulation profile  Recent Labs Lab 04/03/2017 0255  INR 2.33   ------------------------------------------------------------------------------------------------------------------- No results for input(s): DDIMER in the last 72 hours. -------------------------------------------------------------------------------------------------------------------  Cardiac Enzymes No results for input(s): CKMB, TROPONINI, MYOGLOBIN in the last 168 hours.  Invalid input(s): CK ------------------------------------------------------------------------------------------------------------------    Component Value Date/Time   BNP 141.7 (H) 12/28/2016 1955      ---------------------------------------------------------------------------------------------------------------  Urinalysis    Component Value Date/Time   COLORURINE YELLOW  04/06/2017 0431   APPEARANCEUR HAZY (A) 04/06/2017 0431   LABSPEC 1.012 04/06/2017 0431   PHURINE 5.0 03/23/2017 0431   GLUCOSEU 50 (A) 03/21/2017 0431   HGBUR SMALL (A) 03/24/2017 0431   BILIRUBINUR NEGATIVE 04/14/2017 0431   KETONESUR NEGATIVE 04/14/2017 0431   PROTEINUR 30 (A) 03/26/2017 0431   UROBILINOGEN 0.2 09/18/2010 1120   NITRITE NEGATIVE 04/03/2017 0431   LEUKOCYTESUR MODERATE (A) 04/06/2017 0431    ----------------------------------------------------------------------------------------------------------------   Imaging Results:    Dg Chest Portable 1 View  Result Date: 04/09/2017 CLINICAL DATA:  83 year old male with productive cough and shortness of breath. EXAM: PORTABLE CHEST 1 VIEW COMPARISON:  Chest CT dated 03/04/2017 FINDINGS: There is elevation of the right hemidiaphragm as seen on the prior CT. Bilateral interstitial and bibasilar streaky densities as seen on the prior CT may represent atelectatic changes or mild interstitial edema. There is no focal consolidation, pleural effusion, or pneumothorax. There is mild cardiomegaly. No acute osseous pathology. IMPRESSION: 1. Mild cardiomegaly. 2. No focal consolidation. Scattered linear densities, likely atelectasis and less likely interstitial edema. Electronically Signed   By: Anner Crete M.D.   On: 03/27/2017 02:37      Assessment & Plan:    Active Problems:   Abrasion of left forearm   Diabetes mellitus with polyneuropathy (HCC)   Sepsis (HCC)   Hyperkalemia   Acute renal failure (ARF) (HCC)    Sepsis  ? UTI or occult pneumonia Repeat CXR tomorrow after hydration Blood culture, urine culture 7/9=> pending vanco iv pharmacy to dose, zosyn iv pharmacy to dose  Leukocytosis mild acute on chronic secondary to sepsis Hx of  CLL Repeat cbc in am  ARF Check urine sodium, urine creatinine , urine eosinophisl Renal ultrasound Hydrate with ns gently   Hyperkalemia, received kayexalate in ED Calcium gluconate Sodium bicarb  Dyspnea secondary to sepsis Repeat CXR after gentle hydration  Tachycardia resolved Trop I q6h x3 Check cardiac echo  Dm2 fsbs ac and qhs, iss   OSA , on cpap at nite   DVT Prophylaxis Heparin -   SCDs  AM Labs Ordered, also please review Full Orders  Family Communication: Admission, patients condition and plan of care including tests being ordered have been discussed with the patient who indicate understanding and agree with the plan and Code Status.  Code Status FULL CODE  Likely DC to  [facility]  Condition GUARDED    Consults called: none  Admission status: inpatient   Time spent in minutes : 45 minutes   Jani Gravel M.D on 03/27/2017 at 6:09 AM  Between 7am to 7pm - Pager - [phone] After 7pm go to [email or web address] - password [facility]  [facility] - Office  [phone]

## 2017-03-27 NOTE — ED Notes (Signed)
Pt yelling out for help, reports feeling increased sob

## 2017-03-27 NOTE — Progress Notes (Signed)
Admission note:  Arrival Method: via bed from ED Mental Orientation: Alert and oriented x4 Telemetry: box1, verified andCCMD notified. Assessment: in progress Skin: see wound sheet and doc flow sheet IV: R wrist NSL and R shoulder with NS @ 11ml/hr. Pain: denies Safety Measures: discussed and reviewed with pt  Fall Prevention Safety Plan: bed alarm on, call bell and belongings within reach. Admission Screening: in progress 6700 Orientation: Patient has been oriented to the unit, staff and to the room.

## 2017-03-27 NOTE — ED Notes (Signed)
Returned from ct 

## 2017-03-27 NOTE — ED Notes (Signed)
Fall risk arm band and yellow socks on.

## 2017-03-27 NOTE — ED Notes (Signed)
Patient was turned on his left side pad applied to buttocks, coughing up thick sputum breathing treatment in progress.

## 2017-03-27 NOTE — Progress Notes (Signed)
03/26/2017  1:31 AM  04/05/2017 1:09 PM  William Braun was seen and examined.  The H&P by the admitting provider, orders, imaging was reviewed.  Please see new orders.  Will continue to follow.   Murvin Natal, MD [facility]

## 2017-03-27 NOTE — ED Notes (Signed)
Patient repositioned and moved to a hospital bed.

## 2017-03-27 NOTE — Progress Notes (Signed)
ANTICOAGULATION CONSULT NOTE - Initial Consult  Pharmacy Consult for Coumadin Indication: atrial fibrillation  Allergies  Allergen Reactions  . Quinine Palpitations and Other (See Comments)    Seizure   . Uloric [Febuxostat] Rash    Patient Measurements: Height: 6\' 2"  (188 cm) Weight: 248 lb 10.9 oz (112.8 kg) IBW/kg (Calculated) : 82.2   Vital Signs: Temp: 98.8 F (37.1 C) (07/09 1516) BP: 97/65 (07/09 1516) Pulse Rate: 85 (07/09 1238)  Labs:  Recent Labs  03/30/2017 0255 04/08/2017 1534  HGB 13.0  --   HCT 40.6  --   PLT 101*  --   LABPROT 26.0*  --   INR 2.33  --   CREATININE 1.77* 1.55*  TROPONINI  --  0.41*    Estimated Creatinine Clearance: 49.9 mL/min (A) (by C-G formula based on SCr of 1.55 mg/dL (H)).   Medical History: Past Medical History:  Diagnosis Date  . ARF (acute renal failure) ([place]) 03/2017  . Atrial fibrillation ([place])   . Cerebrovascular disease   . CHF (congestive heart failure) ([place])   . CLL (chronic lymphocytic leukemia) ([place]) 11/17/2014  . COPD (chronic obstructive pulmonary disease) ([employer])   . History of thrombocytopenia   . Obesity   . Osteoarthritis   . Other and unspecified hyperlipidemia   . Type II or unspecified type diabetes mellitus without mention of complication, not stated as uncontrolled   . Unspecified essential hypertension     Medications:  Prescriptions Prior to Admission  Medication Sig Dispense Refill Last Dose  . albuterol (VENTOLIN HFA) 108 (90 BASE) MCG/ACT inhaler Inhale 2 puffs into the lungs every 6 (six) hours as needed for wheezing or shortness of breath. 3 Inhaler 1 PRN  . allopurinol (ZYLOPRIM) 100 MG tablet Take 1 tablet (100 mg total) by mouth daily. 30 tablet 6 03/26/2017 at Unknown time  . aluminum-magnesium hydroxide-simethicone (MAALOX) 371-062-69 MG/5ML SUSP Take 30 mLs by mouth every 4 (four) hours as needed (INDIGESTION OR HEARTBURN).   PRN  . Amino Acids-Protein Hydrolys (FEEDING SUPPLEMENT,  PRO-STAT SUGAR FREE 64,) LIQD Take 30 mLs by mouth daily.   03/26/2017 at Unknown time  . antiseptic oral rinse (BIOTENE) LIQD 15 mLs by Mouth Rinse route every 6 (six) hours as needed for dry mouth.   PRN  . atorvastatin (LIPITOR) 10 MG tablet Take 1 tablet (10 mg total) by mouth daily at 6 PM. 30 tablet 0 03/26/2017 at Unknown time  . bisacodyl (DULCOLAX) 10 MG suppository Place 10 mg rectally daily as needed for moderate constipation.   PRN  . budesonide-formoterol (SYMBICORT) 160-4.5 MCG/ACT inhaler Inhale 2 puffs into the lungs 2 (two) times daily. 2 puffs twice daily 3 Inhaler 3 03/26/2017 at Unknown time  . divalproex (DEPAKOTE SPRINKLE) 125 MG capsule Take 250 mg by mouth 3 (three) times daily.   03/26/2017 at Unknown time  . guaiFENesin (MUCINEX) 600 MG 12 hr tablet Take 600 mg by mouth 2 (two) times daily.   03/26/2017 at Unknown time  . HYDROcodone-acetaminophen (NORCO/VICODIN) 5-325 MG tablet Take 1 tablet by mouth every 8 (eight) hours as needed for moderate pain. 10 tablet 0 03/26/2017 at Unknown time  . Insulin Glargine (LANTUS SOLOSTAR) 100 UNIT/ML Solostar Pen Inject 16 Units into the skin daily at 10 pm.   03/26/2017 at Unknown time  . insulin lispro (HUMALOG) 100 UNIT/ML injection Inject 5 Units into the skin 2 (two) times daily as needed for high blood sugar (If CBG >200). QAM and QHS   03/26/2017  at Unknown time  . ipratropium-albuterol (DUONEB) 0.5-2.5 (3) MG/3ML SOLN Take 3 mLs by nebulization every 6 (six) hours as needed (COPD).   PRN  . magnesium hydroxide (MILK OF MAGNESIA) 400 MG/5ML suspension Take 30 mLs by mouth daily as needed for moderate constipation, heartburn or indigestion.    PRN  . Melatonin 3 MG TABS Take 6 mg by mouth at bedtime as needed (sleep).    PRN  . metoprolol succinate (TOPROL-XL) 25 MG 24 hr tablet Take 12.5 mg by mouth daily.    03/26/2017 at 1000  . Misc Natural Products (TART CHERRY ADVANCED) CAPS Take 1 capsule by mouth daily.   03/26/2017 at Unknown time  .  nitroGLYCERIN (NITROSTAT) 0.4 MG SL tablet Place 1 tablet (0.4 mg total) under the tongue every 5 (five) minutes as needed for chest pain. 25 tablet 3 PRN  . NUTRITIONAL SUPPLEMENT LIQD Take 120 mLs by mouth 3 (three) times daily. MedPass   03/26/2017 at Unknown time  . OXYGEN Inhale 3 L into the lungs continuous.   03/26/2017 at Unknown time  . polyethylene glycol (MIRALAX / GLYCOLAX) packet Take 17 g by mouth daily. 14 each 0 03/26/2017 at Unknown time  . potassium chloride SA (K-DUR,KLOR-CON) 20 MEQ tablet Take 40 mEq by mouth 2 (two) times daily.   03/26/2017 at Unknown time  . senna-docusate (SENOKOT-S) 8.6-50 MG tablet Take 1 tablet by mouth at bedtime as needed for mild constipation. 30 tablet 0 PRN  . Sodium Phosphates (RA SALINE ENEMA RE) Place 1 Applicatorful rectally daily as needed (constipation).   PRN  . tamsulosin (FLOMAX) 0.4 MG CAPS capsule Take 0.4 mg by mouth daily.   03/26/2017 at Unknown time  . tiotropium (SPIRIVA) 18 MCG inhalation capsule Place 1 capsule (18 mcg total) into inhaler and inhale daily. 90 capsule 3 03/26/2017 at Unknown time  . torsemide (DEMADEX) 20 MG tablet Take 3 tablets (60 mg total) by mouth daily. 90 tablet 0 03/26/2017 at Unknown time  . warfarin (COUMADIN) 5 MG tablet Take 2.5-5 mg by mouth See admin instructions. Take 2.5 mg by mouth daily on Tuesday, Thursday and Saturday. Take 5 mg by mouth daily on Monday, Wednesday, Friday and Sunday   03/26/2017 at 0600    Assessment: 83 y.o male admitted on 04/15/2017 with pneumonia vs. UTI.  He is on coumadin prior to admission for atrial fibrillation.  INR = 2.33 today 04/04/2017.  H/H 13.0/40.6, pltc 101k  INR is therapeutic.  I will continue with his usual dose of coumadin today, 5mg .  Watch for possible drug interactions with current antibiotics.   PTA warfarin dose: 5 mg every MWFSun and 2.5 mg every Tues., Thurs and Saturday, last taken pta on 03/26/17.    Goal of Therapy:  INR 2-3 Monitor platelets by anticoagulation  protocol: Yes   Plan:  Give Coumadin 5 mg po x1 tonight.  Daily PT/INR   Nicole Cella, RPh Clinical Pharmacist Pager: [phone]-[phone] [phone]-[phone] [place] [phone] 04/13/2017,5:32 PM

## 2017-03-27 NOTE — Progress Notes (Signed)
04/10/2017 5:34 PM  RN tells me that patient never received the kayexalate that had been ordered for him at 4 am this morning.  His repeat BMP shows that his K is still elevated but likely because he never received the kayexalate.  I have ordered to give him 45 g po now and lasix 30 mg IV x 1 dose and recheck BMP in AM.  Monitor on telemetry, EKG not showing any peaked T waves.  I just went and saw the patient and he says that he is feeling a little better, his son is at his bedside and I updated him as well.    Murvin Natal MD

## 2017-03-27 NOTE — ED Notes (Signed)
Wife at bedside. Patient appears more calmer.

## 2017-03-27 NOTE — ED Provider Notes (Signed)
[facility] DEPT Provider Note   CSN: [id number] Arrival date & time: 04/12/2017  0131     History   Chief Complaint Chief Complaint  Patient presents with  . Altered Mental Status    HPI William Braun is a 83 y.o. male.  Patient with history of CHF, COPD, T2DM, atrial fibrillation, coagulopathy secondary to coumadin, CLL, recent admission of urosepsis, presents from [facility] for altered mental status and dyspnea. No fever reported. The patient is limited in his reliable contribution to history. The patient is able to deny pain and vomiting. Per nursing home, the patient became uncharacteristically combative this evening and they became concerned regarding behavior significantly altered from baseline. He was reporting SOB and, per nursing home was found to have O2 saturations in the 80's which is noted to be normal for him. He was send for further evaluation.    The history is provided by the patient and the EMS personnel. No language interpreter was used.    Past Medical History:  Diagnosis Date  . Atrial fibrillation ([place])   . Cerebrovascular disease   . CHF (congestive heart failure) ([place])   . CLL (chronic lymphocytic leukemia) ([place]) 11/17/2014  . COPD (chronic obstructive pulmonary disease) ([place])   . History of thrombocytopenia   . Obesity   . Osteoarthritis   . Other and unspecified hyperlipidemia   . Type II or unspecified type diabetes mellitus without mention of complication, not stated as uncontrolled   . Unspecified essential hypertension     Patient Active Problem List   Diagnosis Date Noted  . Adult failure to thrive 03/15/2017  . Palliative care encounter   . Goals of care, counseling/discussion   . Encounter for hospice care discussion   . Sepsis secondary to UTI ([place]) 03/04/2017  . Acute lower UTI 03/04/2017  . Gout 01/05/2017  . Sepsis ([place]) 12/29/2016  . Acute respiratory failure ([place]) 12/29/2016  . Malnutrition of moderate degree 12/29/2016  .  Pain and swelling of right wrist 12/28/2016  . Chronic kidney disease (CKD), stage III (moderate) 11/17/2016  . Diabetic neuropathy ([place]) 11/17/2016  . Diabetic kidney ([place]) 11/17/2016  . Diabetes mellitus with polyneuropathy ([place]) 11/17/2016  . Acute on chronic diastolic CHF (congestive heart failure) ([place]) 10/31/2016  . Abrasion of left forearm 10/30/2016  . Complicated UTI (urinary tract infection) 10/29/2016  . Acute on chronic respiratory failure with hypoxia ([place]) 09/14/2016  . Elevated troponin 09/14/2016  . Hypokalemia 09/14/2016  . Acute renal failure superimposed on stage 3 chronic kidney disease ([place]) 09/14/2016  . Toe ulcer, right ([place]) 02/02/2016  . CLL (chronic lymphocytic leukemia) ([place]) 11/17/2014  . Elevated WBCs 04/23/2014  . Other emphysema ([place]) 09/27/2013  . Dyspnea 08/13/2013  . 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 Chest pain, unspecified 08/03/2011  . Chronic diastolic CHF (congestive heart failure) ([place]) 05/11/2011  . Obesity hypoventilation syndrome ([place]) 03/12/2010  . Combined systolic and diastolic heart failure ([place]) 03/12/2010  . COPD exacerbation ([place]) 03/12/2010  . Chronic respiratory failure ([place]) 02/19/2010  . HLD (hyperlipidemia) 12/03/2008  . Morbid obesity ([place]) 12/03/2008  . Essential hypertension 12/03/2008  . ATRIAL FIBRILLATION 12/03/2008  . PERIPHERAL EDEMA 12/03/2008    Past Surgical History:  Procedure Laterality Date  . KNEE ARTHROSCOPY W/ ALLOGRAFT IMPANT    . VASECTOMY         Home Medications    Prior to Admission medications   Medication Sig Start Date End Date Taking? Authorizing Provider  albuterol (VENTOLIN HFA) 108 (90 BASE)  MCG/ACT inhaler Inhale 2 puffs into the lungs every 6 (six) hours as needed for wheezing or shortness of breath. 07/23/15   Chesley Mires, MD  allopurinol (ZYLOPRIM) 100 MG tablet Take 1 tablet (100 mg total) by mouth daily. 01/18/17   Lauree Chandler, NP  aluminum-magnesium hydroxide-simethicone  (MAALOX) 563-875-64 MG/5ML SUSP Take 30 mLs by mouth every 4 (four) hours as needed (INDIGESTION OR HEARTBURN).    [provider]  Amino Acids-Protein Hydrolys (FEEDING SUPPLEMENT, PRO-STAT SUGAR FREE 64,) LIQD Take 30 mLs by mouth daily.    [provider]  antiseptic oral rinse (BIOTENE) LIQD 15 mLs by Mouth Rinse route every 6 (six) hours as needed for dry mouth.    [provider]  atorvastatin (LIPITOR) 10 MG tablet Take 1 tablet (10 mg total) by mouth daily at 6 PM. 11/07/16   Lavina Hamman, MD  bisacodyl (DULCOLAX) 10 MG suppository Place 10 mg rectally daily as needed for moderate constipation.    [provider]  budesonide-formoterol (SYMBICORT) 160-4.5 MCG/ACT inhaler Inhale 2 puffs into the lungs 2 (two) times daily. 2 puffs twice daily 11/13/15   Chesley Mires, MD  divalproex (DEPAKOTE) 250 MG DR tablet Take 250 mg by mouth 2 (two) times daily. 12PM and 6PM    [provider]  HYDROcodone-acetaminophen (NORCO/VICODIN) 5-325 MG tablet Take 1 tablet by mouth every 8 (eight) hours as needed for moderate pain. 03/09/17   Raiford Noble Latif, DO  Insulin Glargine (LANTUS SOLOSTAR) 100 UNIT/ML Solostar Pen Inject 16 Units into the skin daily at 10 pm.    [provider]  insulin lispro (HUMALOG) 100 UNIT/ML injection Inject 5 Units into the skin 2 (two) times daily as needed for high blood sugar (If CBG >200). QAM and QHS    [provider]  ipratropium-albuterol (DUONEB) 0.5-2.5 (3) MG/3ML SOLN Take 3 mLs by nebulization every 6 (six) hours as needed (COPD).    [provider]  magnesium hydroxide (MILK OF MAGNESIA) 400 MG/5ML suspension Take 30 mLs by mouth every 4 (four) hours as needed for heartburn or indigestion.     [provider]  Melatonin 3 MG TABS Take 6 mg by mouth at bedtime as needed (sleep).     [provider]  metoprolol succinate (TOPROL-XL) 25 MG 24 hr tablet Take 25 mg by mouth daily.      [provider]  Misc Natural Products (TART CHERRY ADVANCED) CAPS Take 1 capsule by mouth daily.    [provider]  nitroGLYCERIN (NITROSTAT) 0.4 MG SL tablet Place 1 tablet (0.4 mg total) under the tongue every 5 (five) minutes as needed for chest pain. 08/03/11   Richardson Dopp T, PA-C  NUTRITIONAL SUPPLEMENT LIQD Take 120 mLs by mouth 3 (three) times daily. MedPass    [provider]  OXYGEN Inhale 3 L into the lungs continuous.    [provider]  polyethylene glycol (MIRALAX / GLYCOLAX) packet Take 17 g by mouth daily. 11/07/16   Lavina Hamman, MD  potassium chloride SA (K-DUR,KLOR-CON) 20 MEQ tablet Take 40 mEq by mouth 2 (two) times daily.    [provider]  senna-docusate (SENOKOT-S) 8.6-50 MG tablet Take 1 tablet by mouth at bedtime as needed for mild constipation. 11/07/16   Lavina Hamman, MD  Sodium Phosphates (RA SALINE ENEMA RE) Place 1 Applicatorful rectally daily as needed (constipation).    [provider]  tiotropium (SPIRIVA) 18 MCG inhalation capsule Place 1 capsule (18 mcg  total) into inhaler and inhale daily. 11/13/15   Chesley Mires, MD  torsemide (DEMADEX) 20 MG tablet Take 3 tablets (60 mg total) by mouth daily. 03/10/17   Raiford Noble Latif, DO  warfarin (COUMADIN) 2.5 MG tablet Take 2.5 mg by mouth See admin instructions. Take Tue-Thu-Sat    [provider]  warfarin (COUMADIN) 5 MG tablet Take 5 mg by mouth. Take M-W-F-Sun    [provider]    Family History Family History  Problem Relation Age of Onset  . Heart disease Father     Social History Social History  Substance Use Topics  . Smoking status: Former Smoker    Packs/day: 2.00    Years: 34.00    Types: Cigarettes    Start date: 04/23/1946    Quit date: 09/19/1978  . Smokeless tobacco: Never Used     Comment: quit 35 years ago  . Alcohol use No     Allergies   Quinine and Uloric [febuxostat]   Review of Systems Review of  Systems  Unable to perform ROS: Dementia     Physical Exam Updated Vital Signs BP 105/63   Pulse 66   Temp 99.9 F (37.7 C) (Rectal)   Resp (!) 34   SpO2 98%   Physical Exam  Constitutional: He appears well-developed and well-nourished.  HENT:  Head: Normocephalic.  Neck: Normal range of motion. Neck supple.  Cardiovascular: Normal rate and regular rhythm.   Pulmonary/Chest: Effort normal and breath sounds normal.  On O2 by mask. Tachypnea  Abdominal: Soft. Bowel sounds are normal. There is no tenderness. There is no rebound and no guarding.  Musculoskeletal: Normal range of motion.  Neurological: He is alert.  Skin: Skin is warm and dry. No rash noted.  Psychiatric: He has a normal mood and affect.     ED Treatments / Results  Labs (all labs ordered are listed, but only abnormal results are displayed) Labs Reviewed  CULTURE, BLOOD (ROUTINE X 2)  CULTURE, BLOOD (ROUTINE X 2)  URINE CULTURE  COMPREHENSIVE METABOLIC PANEL  CBC WITH DIFFERENTIAL/PLATELET  PROTIME-INR  URINALYSIS, ROUTINE W REFLEX MICROSCOPIC  I-STAT CG4 LACTIC ACID, ED    EKG  EKG Interpretation None       Radiology No results found.  Procedures Procedures (including critical care time)  Medications Ordered in ED Medications  0.9 %  sodium chloride infusion (not administered)  sodium chloride 0.9 % bolus 500 mL (500 mLs Intravenous New Bag/Given 04/07/2017 0218)     Initial Impression / Assessment and Plan / ED Course  I have reviewed the triage vital signs and the nursing notes.  Pertinent labs & imaging results that were available during my care of the patient were reviewed by me and considered in my medical decision making (see chart for details).     Patient appears SOB on arrival. He meets code sepsis criteria with hypotension, tachycardia, tachypnea. Rectal temp of 99.   Chart reviewed. He was recently admitted with urosepsis. Urine pending. Antibiotics started empirically for  recurrent UTI given normal CXR and no other identified source. Leukocytosis of 22, baseline with history of CLL. His potassium is elevated to 6.0. He is also found to have AKI with Cr. 1.77.  The patient continued to appear SOB, tachypneic. BiPap was ordered with more comfortable respiratory status.   Discussed with Dr. Maudie Mercury, [facility], who accepts for admission.  Final Clinical Impressions(s) / ED Diagnoses   Final diagnoses:  None   1. Sepsis 2. Hyperkalemia 3.  AMS 4. AKI  New Prescriptions New Prescriptions   No medications on file     Charlann Lange, Hershal Coria 04/08/2017 [place], [place]vo, MD 03/29/17 [phone]

## 2017-03-27 NOTE — ED Notes (Signed)
Admitting MD at beside. 

## 2017-03-27 NOTE — ED Triage Notes (Signed)
Pt from [facility] for altered mental status and sob. At baseline, pt alert to self and place. Per nursing home staff, pt wears cpap at night. Cpap machine was alarming; staff arrived to room to find pt aggressive and combative (skin tears obtained at facility to R elbow.) Oxygen sats in the high 80s, which staff reports pt's baseline saturation in high 80s to low 90s. Pt denies pain. Recently admitted for sepsis from UTI. Lung sounds diminished, productive cough noted

## 2017-03-27 NOTE — Progress Notes (Signed)
Pharmacy Antibiotic Note  STARSKY NANNA is a 83 y.o. male admitted on 03/23/2017 with pneumonia vs. UTI.  Pharmacy has been consulted for cefepime/vancomycin dosing. Afebrile, WBC 22.7. SCr 1.77 on admit, norm CrCl~33.  Received vancomycin 1g IV x 1 at 0409 this AM and ceftriaxone x 1 this AM.  Plan: Vancomycin 1250g IV q24h Cefepime 2g IV q24h Monitor clinical progress, c/s, renal function F/u de-escalation plan/LOT, vancomycin trough as indicated      Temp (24hrs), Avg:99 F (37.2 C), Min:98 F (36.7 C), Max:99.9 F (37.7 C)   Recent Labs Lab 03/23/17 0817 04/03/2017 0255 04/17/2017 0259 04/06/2017 0446  WBC 19.8* 22.7*  --   --   CREATININE 1.0 1.77*  --   --   LATICACIDVEN  --   --  3.41* 2.75*    Estimated Creatinine Clearance: 43.1 mL/min (A) (by C-G formula based on SCr of 1.77 mg/dL (H)).    Allergies  Allergen Reactions  . Quinine Palpitations and Other (See Comments)    Seizure   . Uloric [Febuxostat] Rash    Elicia Lamp, PharmD, BCPS Clinical Pharmacist Rx Phone # for today: [phone] After 3:30PM, please call Main Rx: [phone] 04/13/2017 12:16 PM

## 2017-03-27 NOTE — ED Notes (Signed)
Pt CBG was 105, notified Kelly((RN)

## 2017-03-27 NOTE — ED Notes (Signed)
Patient repositioned in bed turned to left side.

## 2017-03-27 NOTE — ED Notes (Signed)
Patient c/o not being able to breath after talking with  patient he calmed down. Linens changed

## 2017-03-27 NOTE — Progress Notes (Signed)
Results for William Braun, William Braun (MRN [id number]) as of 04/16/2017 16:48  Ref. Range 04/17/2017 15:34  Troponin I Latest Ref Range: <0.03 ng/mL 0.41 (HH)   Dr. Wynetta Emery text paged via [place].

## 2017-03-27 NOTE — Progress Notes (Signed)
04/16/2017 4:49 PM  Pt with elevated troponin, will get another EKG, no symptoms of chest pain, suspect some demand ischemia a/w sepsis, will continue to trend and follow clinically.   Murvin Natal, MD

## 2017-03-27 NOTE — ED Notes (Signed)
Pt in CT, unable to lie flat, becomes more agitated and combative when pt lays flat. Dr.Nanavati made aware. CT head held at this time

## 2017-03-27 NOTE — ED Notes (Signed)
Pt calling out for help, remains short of breath with BIPAP. RT made aware, will try cpap

## 2017-03-28 ENCOUNTER — Inpatient Hospital Stay (HOSPITAL_COMMUNITY): Payer: PPO

## 2017-03-28 ENCOUNTER — Other Ambulatory Visit (HOSPITAL_COMMUNITY): Payer: PPO

## 2017-03-28 DIAGNOSIS — A419 Sepsis, unspecified organism: Principal | ICD-10-CM

## 2017-03-28 DIAGNOSIS — N179 Acute kidney failure, unspecified: Secondary | ICD-10-CM

## 2017-03-28 LAB — HEMOGLOBIN A1C
HEMOGLOBIN A1C: 7.1 % — AB (ref 4.8–5.6)
MEAN PLASMA GLUCOSE: 157 mg/dL

## 2017-03-28 LAB — CALCIUM / CREATININE RATIO, URINE
CREATININE, UR: 73 mg/dL
Calcium, Ur: 1 mg/dL
Calcium/Creat.Ratio: 14 mg/g creat (ref 0–260)

## 2017-03-28 LAB — URINE CULTURE: Culture: NO GROWTH

## 2017-04-01 LAB — CULTURE, BLOOD (ROUTINE X 2)
CULTURE: NO GROWTH
Culture: NO GROWTH
SPECIAL REQUESTS: ADEQUATE
Special Requests: ADEQUATE

## 2017-04-19 NOTE — Discharge Summary (Signed)
Expiration Note  William Braun  MR#: [id number]  DOB:1933/05/28  Date of Admission: 2017-04-08 Date of Death: Apr 09, 2017  Attending [place]  Patient's PCP: Hendricks Limes, MD  Cause of Death: Acute cardiopulmonary failure Sepsis Acute on chronic kidney disease Hyperkalemia Acute respiratory failure  Present on Admission: . Sepsis ([place]) . Abrasion of left forearm . Diabetes mellitus with polyneuropathy ([place]) . ARF (acute renal failure) (HCC)  HPI:    William Braun  is a 83 y.o. male with PMH of  CHF, Pafib, Dm2, Copd, CLL and recent admission for urosepsis apparently presents with AMS and dyspnea.  Pt is not a very reliable historian.  Pt was brought in by EMS.  He has had multiple recent hospitalizations.    In ED, CXR no focal consolidation.  Ua=> LE moderate.  Urine culture pending, blood culture pending.  Wbc 22.7,  Bun/creat 38/1.77  K=6.0,  Pt treated with kayexalate in ED.     Pt seems more alert than described, states that he has been short of breath the past day.    Pt was admitted for dyspnea, ARF, Hyperkalemia and concerns for sepsis.   He was admitted to the [facility] telemetry monitored floor from ED and given treatments for hyperkalemia and sepsis, dehydration and Acute on chronic renal failure.   This information was taken from night shift nursing notes.  It was noted during the night shift that in the early morning hours of 04-09-2017 his heart rate was noted to be in 30-40 range picked up by telemetry monitoring.  RN went to assess patient and Pt was found unresponsive in the room and not breathing.  He was Do not resuscitate and no code blue was called.  Rapid response was called.  The patient expired and time of death was noted 0136.  Pt's family was notified.    Signed: Radwan Cowley 04-09-2017, 7:50 AM

## 2017-04-19 NOTE — Progress Notes (Signed)
Around 0132,CCMD called RN that patient's heart rate is in the 40's and now 39,so RN went to patient's room.Patient laying on bed with head of bed elevated at 30 degrees. Patient unresponsive and not breathing at this time.Got a call again from CCMD that patient is in asystole. Notified rapid response RN and AC. Patient has no respiration and no pulse appreciated,verified by another RN,Kami Moore.Notified K. Schorr,MD on call. Called patient's spouse with two numbers listed but it goes straight to voicemail. Called patient's son,Warren Wolin and notified of patient's expiration. Time of death is 0136. Notified Medical Examiner,Diane,according to her,patient is not a Medical examiner case. Notified [facility] and spoke with Benjie Karvonen, patient is not a potential donor,referral [phone].

## 2017-04-19 DEATH — deceased

## 2017-06-21 ENCOUNTER — Ambulatory Visit: Payer: PPO | Admitting: Hematology & Oncology

## 2017-06-21 ENCOUNTER — Other Ambulatory Visit: Payer: PPO

## 2017-06-22 ENCOUNTER — Other Ambulatory Visit: Payer: PPO

## 2017-06-22 ENCOUNTER — Ambulatory Visit: Payer: PPO | Admitting: Hematology & Oncology
